# Patient Record
Sex: Female | Born: 1960 | Race: Black or African American | Hispanic: No | Marital: Single | Smoking: Current every day smoker
Health system: Southern US, Community
[De-identification: ages and names within clinical notes are randomized; demographics above are authoritative.]

## PROBLEM LIST (undated history)

## (undated) DIAGNOSIS — N63 Unspecified lump in unspecified breast: Secondary | ICD-10-CM

## (undated) DIAGNOSIS — F319 Bipolar disorder, unspecified: Secondary | ICD-10-CM

## (undated) DIAGNOSIS — R519 Headache, unspecified: Secondary | ICD-10-CM

## (undated) DIAGNOSIS — J45909 Unspecified asthma, uncomplicated: Secondary | ICD-10-CM

## (undated) DIAGNOSIS — F329 Major depressive disorder, single episode, unspecified: Secondary | ICD-10-CM

## (undated) DIAGNOSIS — N920 Excessive and frequent menstruation with regular cycle: Secondary | ICD-10-CM

## (undated) DIAGNOSIS — D126 Benign neoplasm of colon, unspecified: Secondary | ICD-10-CM

## (undated) DIAGNOSIS — J40 Bronchitis, not specified as acute or chronic: Secondary | ICD-10-CM

## (undated) DIAGNOSIS — J439 Emphysema, unspecified: Secondary | ICD-10-CM

## (undated) DIAGNOSIS — R51 Headache: Secondary | ICD-10-CM

## (undated) DIAGNOSIS — N938 Other specified abnormal uterine and vaginal bleeding: Secondary | ICD-10-CM

## (undated) DIAGNOSIS — J449 Chronic obstructive pulmonary disease, unspecified: Secondary | ICD-10-CM

## (undated) DIAGNOSIS — F1910 Other psychoactive substance abuse, uncomplicated: Secondary | ICD-10-CM

## (undated) DIAGNOSIS — K219 Gastro-esophageal reflux disease without esophagitis: Secondary | ICD-10-CM

## (undated) DIAGNOSIS — M1990 Unspecified osteoarthritis, unspecified site: Secondary | ICD-10-CM

## (undated) DIAGNOSIS — M545 Low back pain: Secondary | ICD-10-CM

## (undated) DIAGNOSIS — I639 Cerebral infarction, unspecified: Secondary | ICD-10-CM

## (undated) DIAGNOSIS — J441 Chronic obstructive pulmonary disease with (acute) exacerbation: Secondary | ICD-10-CM

## (undated) DIAGNOSIS — R600 Localized edema: Secondary | ICD-10-CM

## (undated) DIAGNOSIS — R443 Hallucinations, unspecified: Secondary | ICD-10-CM

## (undated) DIAGNOSIS — G8929 Other chronic pain: Secondary | ICD-10-CM

## (undated) DIAGNOSIS — N83209 Unspecified ovarian cyst, unspecified side: Secondary | ICD-10-CM

## (undated) DIAGNOSIS — E785 Hyperlipidemia, unspecified: Secondary | ICD-10-CM

## (undated) DIAGNOSIS — R55 Syncope and collapse: Secondary | ICD-10-CM

## (undated) DIAGNOSIS — F32A Depression, unspecified: Secondary | ICD-10-CM

## (undated) DIAGNOSIS — I82890 Acute embolism and thrombosis of other specified veins: Secondary | ICD-10-CM

## (undated) DIAGNOSIS — G4730 Sleep apnea, unspecified: Secondary | ICD-10-CM

## (undated) DIAGNOSIS — F419 Anxiety disorder, unspecified: Secondary | ICD-10-CM

## (undated) HISTORY — PX: COLONOSCOPY: SHX174

## (undated) HISTORY — DX: Emphysema, unspecified: J43.9

## (undated) HISTORY — DX: Unspecified lump in unspecified breast: N63.0

## (undated) HISTORY — DX: Low back pain: M54.5

## (undated) HISTORY — DX: Other specified abnormal uterine and vaginal bleeding: N93.8

## (undated) HISTORY — PX: POLYPECTOMY: SHX149

## (undated) HISTORY — DX: Headache: R51

## (undated) HISTORY — DX: Gastro-esophageal reflux disease without esophagitis: K21.9

## (undated) HISTORY — DX: Headache, unspecified: R51.9

## (undated) HISTORY — DX: Localized edema: R60.0

## (undated) HISTORY — PX: OTHER SURGICAL HISTORY: SHX169

## (undated) HISTORY — DX: Other chronic pain: G89.29

## (undated) HISTORY — DX: Anxiety disorder, unspecified: F41.9

## (undated) HISTORY — PX: VAGINA SURGERY: SHX829

## (undated) HISTORY — PX: HERNIA REPAIR: SHX51

## (undated) HISTORY — DX: Hallucinations, unspecified: R44.3

## (undated) HISTORY — DX: Depression, unspecified: F32.A

## (undated) HISTORY — DX: Hyperlipidemia, unspecified: E78.5

## (undated) HISTORY — DX: Syncope and collapse: R55

## (undated) HISTORY — DX: Other psychoactive substance abuse, uncomplicated: F19.10

## (undated) HISTORY — DX: Bipolar disorder, unspecified: F31.9

## (undated) HISTORY — DX: Cerebral infarction, unspecified: I63.9

## (undated) HISTORY — DX: Benign neoplasm of colon, unspecified: D12.6

## (undated) HISTORY — DX: Excessive and frequent menstruation with regular cycle: N92.0

## (undated) HISTORY — DX: Unspecified osteoarthritis, unspecified site: M19.90

## (undated) HISTORY — PX: OOPHORECTOMY: SHX86

## (undated) HISTORY — DX: Sleep apnea, unspecified: G47.30

---
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
## 1999-04-20 ENCOUNTER — Inpatient Hospital Stay (HOSPITAL_COMMUNITY): Admission: EM | Admit: 1999-04-20 | Discharge: 1999-04-24 | Payer: Self-pay | Admitting: Emergency Medicine

## 1999-04-20 ENCOUNTER — Encounter: Payer: Self-pay | Admitting: Emergency Medicine

## 1999-09-24 ENCOUNTER — Emergency Department (HOSPITAL_COMMUNITY): Admission: EM | Admit: 1999-09-24 | Discharge: 1999-09-24 | Payer: Self-pay | Admitting: Emergency Medicine

## 2000-04-07 ENCOUNTER — Emergency Department (HOSPITAL_COMMUNITY): Admission: EM | Admit: 2000-04-07 | Discharge: 2000-04-07 | Payer: Self-pay | Admitting: Emergency Medicine

## 2000-04-07 ENCOUNTER — Encounter: Payer: Self-pay | Admitting: Emergency Medicine

## 2000-07-21 ENCOUNTER — Emergency Department (HOSPITAL_COMMUNITY): Admission: EM | Admit: 2000-07-21 | Discharge: 2000-07-21 | Payer: Self-pay | Admitting: Emergency Medicine

## 2000-07-26 ENCOUNTER — Emergency Department (HOSPITAL_COMMUNITY): Admission: EM | Admit: 2000-07-26 | Discharge: 2000-07-26 | Payer: Self-pay | Admitting: Emergency Medicine

## 2001-01-18 ENCOUNTER — Encounter: Admission: RE | Admit: 2001-01-18 | Discharge: 2001-01-18 | Payer: Self-pay | Admitting: Internal Medicine

## 2001-01-18 ENCOUNTER — Encounter: Payer: Self-pay | Admitting: Internal Medicine

## 2001-08-08 ENCOUNTER — Emergency Department (HOSPITAL_COMMUNITY): Admission: EM | Admit: 2001-08-08 | Discharge: 2001-08-08 | Payer: Self-pay | Admitting: Emergency Medicine

## 2001-08-24 ENCOUNTER — Encounter: Admission: RE | Admit: 2001-08-24 | Discharge: 2001-08-24 | Payer: Self-pay | Admitting: Internal Medicine

## 2001-10-12 ENCOUNTER — Encounter: Admission: RE | Admit: 2001-10-12 | Discharge: 2001-10-12 | Payer: Self-pay | Admitting: Obstetrics

## 2001-12-03 ENCOUNTER — Emergency Department (HOSPITAL_COMMUNITY): Admission: EM | Admit: 2001-12-03 | Discharge: 2001-12-03 | Payer: Self-pay | Admitting: Emergency Medicine

## 2001-12-12 ENCOUNTER — Emergency Department (HOSPITAL_COMMUNITY): Admission: EM | Admit: 2001-12-12 | Discharge: 2001-12-12 | Payer: Self-pay | Admitting: Emergency Medicine

## 2001-12-18 ENCOUNTER — Emergency Department (HOSPITAL_COMMUNITY): Admission: EM | Admit: 2001-12-18 | Discharge: 2001-12-18 | Payer: Self-pay | Admitting: Emergency Medicine

## 2001-12-18 ENCOUNTER — Encounter: Payer: Self-pay | Admitting: Emergency Medicine

## 2002-01-02 ENCOUNTER — Encounter: Admission: RE | Admit: 2002-01-02 | Discharge: 2002-01-02 | Payer: Self-pay

## 2002-03-20 ENCOUNTER — Emergency Department (HOSPITAL_COMMUNITY): Admission: EM | Admit: 2002-03-20 | Discharge: 2002-03-20 | Payer: Self-pay | Admitting: *Deleted

## 2002-04-05 ENCOUNTER — Emergency Department (HOSPITAL_COMMUNITY): Admission: EM | Admit: 2002-04-05 | Discharge: 2002-04-05 | Payer: Self-pay | Admitting: Emergency Medicine

## 2002-04-26 ENCOUNTER — Emergency Department (HOSPITAL_COMMUNITY): Admission: EM | Admit: 2002-04-26 | Discharge: 2002-04-26 | Payer: Self-pay | Admitting: Emergency Medicine

## 2002-07-31 ENCOUNTER — Encounter: Payer: Self-pay | Admitting: Internal Medicine

## 2002-07-31 ENCOUNTER — Encounter: Admission: RE | Admit: 2002-07-31 | Discharge: 2002-07-31 | Payer: Self-pay | Admitting: Internal Medicine

## 2003-04-04 ENCOUNTER — Emergency Department (HOSPITAL_COMMUNITY): Admission: EM | Admit: 2003-04-04 | Discharge: 2003-04-04 | Payer: Self-pay | Admitting: *Deleted

## 2003-06-24 ENCOUNTER — Encounter: Admission: RE | Admit: 2003-06-24 | Discharge: 2003-06-24 | Payer: Self-pay | Admitting: Internal Medicine

## 2003-06-25 ENCOUNTER — Ambulatory Visit (HOSPITAL_COMMUNITY): Admission: RE | Admit: 2003-06-25 | Discharge: 2003-06-25 | Payer: Self-pay | Admitting: Internal Medicine

## 2003-08-04 ENCOUNTER — Emergency Department (HOSPITAL_COMMUNITY): Admission: EM | Admit: 2003-08-04 | Discharge: 2003-08-04 | Payer: Self-pay | Admitting: Emergency Medicine

## 2003-10-23 ENCOUNTER — Emergency Department (HOSPITAL_COMMUNITY): Admission: EM | Admit: 2003-10-23 | Discharge: 2003-10-23 | Payer: Self-pay | Admitting: Emergency Medicine

## 2003-10-24 ENCOUNTER — Encounter: Admission: RE | Admit: 2003-10-24 | Discharge: 2003-10-24 | Payer: Self-pay | Admitting: Internal Medicine

## 2004-03-31 ENCOUNTER — Emergency Department (HOSPITAL_COMMUNITY): Admission: EM | Admit: 2004-03-31 | Discharge: 2004-03-31 | Payer: Self-pay | Admitting: Emergency Medicine

## 2004-09-09 ENCOUNTER — Ambulatory Visit: Payer: Self-pay | Admitting: Internal Medicine

## 2005-01-08 ENCOUNTER — Emergency Department (HOSPITAL_COMMUNITY): Admission: EM | Admit: 2005-01-08 | Discharge: 2005-01-08 | Payer: Self-pay | Admitting: Emergency Medicine

## 2005-05-17 ENCOUNTER — Emergency Department (HOSPITAL_COMMUNITY): Admission: EM | Admit: 2005-05-17 | Discharge: 2005-05-17 | Payer: Self-pay | Admitting: Emergency Medicine

## 2005-06-12 ENCOUNTER — Emergency Department (HOSPITAL_COMMUNITY): Admission: EM | Admit: 2005-06-12 | Discharge: 2005-06-12 | Payer: Self-pay | Admitting: Emergency Medicine

## 2005-09-19 ENCOUNTER — Emergency Department (HOSPITAL_COMMUNITY): Admission: EM | Admit: 2005-09-19 | Discharge: 2005-09-19 | Payer: Self-pay | Admitting: Emergency Medicine

## 2005-11-10 ENCOUNTER — Emergency Department (HOSPITAL_COMMUNITY): Admission: EM | Admit: 2005-11-10 | Discharge: 2005-11-10 | Payer: Self-pay | Admitting: Emergency Medicine

## 2006-05-02 ENCOUNTER — Emergency Department (HOSPITAL_COMMUNITY): Admission: EM | Admit: 2006-05-02 | Discharge: 2006-05-02 | Payer: Self-pay | Admitting: Emergency Medicine

## 2008-03-07 ENCOUNTER — Ambulatory Visit: Payer: Self-pay | Admitting: Internal Medicine

## 2008-03-07 ENCOUNTER — Encounter: Payer: Self-pay | Admitting: Emergency Medicine

## 2008-03-07 ENCOUNTER — Inpatient Hospital Stay (HOSPITAL_COMMUNITY): Admission: AD | Admit: 2008-03-07 | Discharge: 2008-03-14 | Payer: Self-pay | Admitting: Internal Medicine

## 2008-03-08 ENCOUNTER — Ambulatory Visit: Payer: Self-pay | Admitting: Vascular Surgery

## 2008-03-08 ENCOUNTER — Encounter (INDEPENDENT_AMBULATORY_CARE_PROVIDER_SITE_OTHER): Payer: Self-pay | Admitting: Internal Medicine

## 2008-03-14 ENCOUNTER — Encounter: Payer: Self-pay | Admitting: Internal Medicine

## 2008-03-14 DIAGNOSIS — F1910 Other psychoactive substance abuse, uncomplicated: Secondary | ICD-10-CM | POA: Insufficient documentation

## 2008-03-14 DIAGNOSIS — G43809 Other migraine, not intractable, without status migrainosus: Secondary | ICD-10-CM | POA: Insufficient documentation

## 2008-03-14 DIAGNOSIS — F411 Generalized anxiety disorder: Secondary | ICD-10-CM | POA: Insufficient documentation

## 2008-03-18 ENCOUNTER — Ambulatory Visit: Payer: Self-pay | Admitting: Internal Medicine

## 2008-03-18 ENCOUNTER — Encounter (INDEPENDENT_AMBULATORY_CARE_PROVIDER_SITE_OTHER): Payer: Self-pay | Admitting: *Deleted

## 2008-03-18 ENCOUNTER — Encounter (INDEPENDENT_AMBULATORY_CARE_PROVIDER_SITE_OTHER): Payer: Self-pay | Admitting: Internal Medicine

## 2008-03-18 DIAGNOSIS — R443 Hallucinations, unspecified: Secondary | ICD-10-CM

## 2008-03-18 HISTORY — DX: Hallucinations, unspecified: R44.3

## 2008-03-18 LAB — CONVERTED CEMR LAB
ALT: 39 units/L — ABNORMAL HIGH (ref 0–35)
AST: 31 units/L (ref 0–37)
Albumin: 3.3 g/dL — ABNORMAL LOW (ref 3.5–5.2)
Alkaline Phosphatase: 52 units/L (ref 39–117)
Ammonia: 10 umol/L — ABNORMAL LOW (ref 11–35)
BUN: 10 mg/dL (ref 6–23)
Basophils Absolute: 0.1 10*3/uL (ref 0.0–0.1)
CO2: 28 meq/L (ref 19–32)
Chloride: 104 meq/L (ref 96–112)
Creatinine, Ser: 0.86 mg/dL (ref 0.40–1.20)
Creatinine,U: 224.4 mg/dL
Eosinophils Absolute: 0.1 10*3/uL (ref 0.0–0.7)
Eosinophils Relative: 0 % (ref 0–5)
Glucose, Bld: 78 mg/dL (ref 70–99)
Lymphocytes Relative: 31 % (ref 12–46)
MCV: 85 fL (ref 78.0–100.0)
Marijuana Metabolite: NEGATIVE
Monocytes Absolute: 1.4 10*3/uL — ABNORMAL HIGH (ref 0.1–1.0)
Monocytes Relative: 8 % (ref 3–12)
Neutro Abs: 10.1 10*3/uL — ABNORMAL HIGH (ref 1.7–7.7)
Platelets: 301 10*3/uL (ref 150–400)
Propoxyphene: NEGATIVE
RDW: 14.1 % (ref 11.5–15.5)
Total Bilirubin: 0.4 mg/dL (ref 0.3–1.2)
WBC: 16.6 10*3/uL — ABNORMAL HIGH (ref 4.0–10.5)

## 2008-03-19 ENCOUNTER — Encounter (INDEPENDENT_AMBULATORY_CARE_PROVIDER_SITE_OTHER): Payer: Self-pay | Admitting: Internal Medicine

## 2008-03-19 ENCOUNTER — Ambulatory Visit: Payer: Self-pay | Admitting: Internal Medicine

## 2008-03-19 DIAGNOSIS — R109 Unspecified abdominal pain: Secondary | ICD-10-CM | POA: Insufficient documentation

## 2008-03-19 DIAGNOSIS — M79609 Pain in unspecified limb: Secondary | ICD-10-CM | POA: Insufficient documentation

## 2008-03-20 ENCOUNTER — Ambulatory Visit (HOSPITAL_COMMUNITY): Admission: RE | Admit: 2008-03-20 | Discharge: 2008-03-20 | Payer: Self-pay | Admitting: Internal Medicine

## 2008-03-20 ENCOUNTER — Encounter: Payer: Self-pay | Admitting: Licensed Clinical Social Worker

## 2008-03-25 ENCOUNTER — Encounter (INDEPENDENT_AMBULATORY_CARE_PROVIDER_SITE_OTHER): Payer: Self-pay | Admitting: Internal Medicine

## 2008-03-25 ENCOUNTER — Ambulatory Visit: Payer: Self-pay | Admitting: Internal Medicine

## 2008-03-25 DIAGNOSIS — N63 Unspecified lump in unspecified breast: Secondary | ICD-10-CM

## 2008-03-25 DIAGNOSIS — H539 Unspecified visual disturbance: Secondary | ICD-10-CM | POA: Insufficient documentation

## 2008-03-25 DIAGNOSIS — N644 Mastodynia: Secondary | ICD-10-CM | POA: Insufficient documentation

## 2008-03-25 HISTORY — DX: Unspecified lump in unspecified breast: N63.0

## 2008-03-26 ENCOUNTER — Ambulatory Visit (HOSPITAL_COMMUNITY): Admission: RE | Admit: 2008-03-26 | Discharge: 2008-03-26 | Payer: Self-pay | Admitting: Internal Medicine

## 2008-03-26 ENCOUNTER — Encounter (INDEPENDENT_AMBULATORY_CARE_PROVIDER_SITE_OTHER): Payer: Self-pay | Admitting: Internal Medicine

## 2008-03-27 ENCOUNTER — Ambulatory Visit (HOSPITAL_COMMUNITY): Admission: RE | Admit: 2008-03-27 | Discharge: 2008-03-27 | Payer: Self-pay | Admitting: Internal Medicine

## 2008-04-01 ENCOUNTER — Telehealth: Payer: Self-pay | Admitting: Licensed Clinical Social Worker

## 2008-04-02 ENCOUNTER — Ambulatory Visit: Payer: Self-pay | Admitting: Vascular Surgery

## 2008-04-02 ENCOUNTER — Encounter (INDEPENDENT_AMBULATORY_CARE_PROVIDER_SITE_OTHER): Payer: Self-pay | Admitting: Internal Medicine

## 2008-04-02 ENCOUNTER — Ambulatory Visit (HOSPITAL_COMMUNITY): Admission: RE | Admit: 2008-04-02 | Discharge: 2008-04-02 | Payer: Self-pay | Admitting: Internal Medicine

## 2008-04-02 ENCOUNTER — Encounter: Payer: Self-pay | Admitting: Internal Medicine

## 2008-04-03 ENCOUNTER — Encounter: Admission: RE | Admit: 2008-04-03 | Discharge: 2008-04-03 | Payer: Self-pay | Admitting: *Deleted

## 2008-04-08 ENCOUNTER — Encounter (INDEPENDENT_AMBULATORY_CARE_PROVIDER_SITE_OTHER): Payer: Self-pay | Admitting: Internal Medicine

## 2008-04-09 ENCOUNTER — Encounter (INDEPENDENT_AMBULATORY_CARE_PROVIDER_SITE_OTHER): Payer: Self-pay | Admitting: Internal Medicine

## 2008-04-09 ENCOUNTER — Ambulatory Visit: Payer: Self-pay | Admitting: Internal Medicine

## 2008-04-09 LAB — CONVERTED CEMR LAB
Anti Nuclear Antibody(ANA): NEGATIVE
BUN: 6 mg/dL (ref 6–23)
Barbiturate Quant, Ur: NEGATIVE
Bilirubin Urine: NEGATIVE
CO2: 23 meq/L (ref 19–32)
Calcium: 8.7 mg/dL (ref 8.4–10.5)
Creatinine, Ser: 0.78 mg/dL (ref 0.40–1.20)
Creatinine,U: 158.4 mg/dL
Hemoglobin, Urine: NEGATIVE
Ketones, ur: NEGATIVE mg/dL
Marijuana Metabolite: NEGATIVE
Potassium: 4.3 meq/L (ref 3.5–5.3)
Propoxyphene: NEGATIVE
Sed Rate: 9 mm/hr (ref 0–22)
Sodium: 141 meq/L (ref 135–145)
Specific Gravity, Urine: 1.03 (ref 1.005–1.03)
Urine Glucose: NEGATIVE mg/dL

## 2008-04-12 ENCOUNTER — Encounter: Admission: RE | Admit: 2008-04-12 | Discharge: 2008-04-12 | Payer: Self-pay | Admitting: *Deleted

## 2008-04-12 ENCOUNTER — Encounter (INDEPENDENT_AMBULATORY_CARE_PROVIDER_SITE_OTHER): Payer: Self-pay | Admitting: Diagnostic Radiology

## 2008-04-17 ENCOUNTER — Encounter: Admission: RE | Admit: 2008-04-17 | Discharge: 2008-04-17 | Payer: Self-pay | Admitting: Internal Medicine

## 2008-04-29 ENCOUNTER — Ambulatory Visit: Payer: Self-pay | Admitting: *Deleted

## 2008-04-29 ENCOUNTER — Encounter (INDEPENDENT_AMBULATORY_CARE_PROVIDER_SITE_OTHER): Payer: Self-pay | Admitting: Internal Medicine

## 2008-04-29 LAB — CONVERTED CEMR LAB
ALT: 20 units/L (ref 0–35)
Albumin: 3.2 g/dL — ABNORMAL LOW (ref 3.5–5.2)
Alkaline Phosphatase: 57 units/L (ref 39–117)
BUN: 6 mg/dL (ref 6–23)
Calcium: 8.5 mg/dL (ref 8.4–10.5)
Chloride: 106 meq/L (ref 96–112)
Creatinine, Ser: 0.74 mg/dL (ref 0.40–1.20)
Creatinine, Urine: 59 mg/dL
Microalb Creat Ratio: 4.1 mg/g (ref 0.0–30.0)
Microalb, Ur: 0.24 mg/dL (ref 0.00–1.89)
Total Protein: 5.4 g/dL — ABNORMAL LOW (ref 6.0–8.3)

## 2008-05-02 ENCOUNTER — Telehealth: Payer: Self-pay | Admitting: *Deleted

## 2008-05-02 ENCOUNTER — Encounter (INDEPENDENT_AMBULATORY_CARE_PROVIDER_SITE_OTHER): Payer: Self-pay | Admitting: Internal Medicine

## 2008-05-02 ENCOUNTER — Telehealth (INDEPENDENT_AMBULATORY_CARE_PROVIDER_SITE_OTHER): Payer: Self-pay | Admitting: Internal Medicine

## 2008-05-08 ENCOUNTER — Encounter: Payer: Self-pay | Admitting: Family

## 2008-05-08 ENCOUNTER — Telehealth: Payer: Self-pay | Admitting: *Deleted

## 2008-05-08 ENCOUNTER — Ambulatory Visit: Payer: Self-pay | Admitting: Obstetrics & Gynecology

## 2008-05-09 ENCOUNTER — Ambulatory Visit: Payer: Self-pay | Admitting: Internal Medicine

## 2008-05-13 ENCOUNTER — Encounter (INDEPENDENT_AMBULATORY_CARE_PROVIDER_SITE_OTHER): Payer: Self-pay | Admitting: Internal Medicine

## 2008-05-14 ENCOUNTER — Encounter (INDEPENDENT_AMBULATORY_CARE_PROVIDER_SITE_OTHER): Payer: Self-pay | Admitting: Internal Medicine

## 2008-05-14 ENCOUNTER — Ambulatory Visit (HOSPITAL_COMMUNITY): Admission: RE | Admit: 2008-05-14 | Discharge: 2008-05-14 | Payer: Self-pay | Admitting: Internal Medicine

## 2008-05-31 ENCOUNTER — Ambulatory Visit: Payer: Self-pay | Admitting: Internal Medicine

## 2008-05-31 DIAGNOSIS — G4700 Insomnia, unspecified: Secondary | ICD-10-CM | POA: Insufficient documentation

## 2008-06-18 ENCOUNTER — Ambulatory Visit (HOSPITAL_COMMUNITY): Admission: RE | Admit: 2008-06-18 | Discharge: 2008-06-18 | Payer: Self-pay | Admitting: Obstetrics & Gynecology

## 2008-06-18 ENCOUNTER — Encounter: Payer: Self-pay | Admitting: Obstetrics & Gynecology

## 2008-06-18 ENCOUNTER — Ambulatory Visit: Payer: Self-pay | Admitting: Obstetrics & Gynecology

## 2008-06-24 ENCOUNTER — Encounter (INDEPENDENT_AMBULATORY_CARE_PROVIDER_SITE_OTHER): Payer: Self-pay | Admitting: Internal Medicine

## 2008-07-19 ENCOUNTER — Ambulatory Visit: Payer: Self-pay | Admitting: Gynecology

## 2008-07-27 ENCOUNTER — Encounter (INDEPENDENT_AMBULATORY_CARE_PROVIDER_SITE_OTHER): Payer: Self-pay | Admitting: Internal Medicine

## 2008-08-08 ENCOUNTER — Ambulatory Visit: Payer: Self-pay | Admitting: Internal Medicine

## 2008-08-08 DIAGNOSIS — M545 Low back pain, unspecified: Secondary | ICD-10-CM | POA: Insufficient documentation

## 2008-08-08 DIAGNOSIS — J45901 Unspecified asthma with (acute) exacerbation: Secondary | ICD-10-CM | POA: Insufficient documentation

## 2008-08-08 DIAGNOSIS — G8929 Other chronic pain: Secondary | ICD-10-CM | POA: Insufficient documentation

## 2008-08-08 DIAGNOSIS — J4489 Other specified chronic obstructive pulmonary disease: Secondary | ICD-10-CM | POA: Diagnosis present

## 2008-08-08 DIAGNOSIS — J45909 Unspecified asthma, uncomplicated: Secondary | ICD-10-CM

## 2008-08-08 HISTORY — DX: Other chronic pain: G89.29

## 2008-08-08 HISTORY — DX: Low back pain, unspecified: M54.50

## 2008-08-29 ENCOUNTER — Telehealth (INDEPENDENT_AMBULATORY_CARE_PROVIDER_SITE_OTHER): Payer: Self-pay | Admitting: *Deleted

## 2008-08-29 ENCOUNTER — Ambulatory Visit: Payer: Self-pay | Admitting: Internal Medicine

## 2008-08-29 ENCOUNTER — Encounter (INDEPENDENT_AMBULATORY_CARE_PROVIDER_SITE_OTHER): Payer: Self-pay | Admitting: Internal Medicine

## 2008-08-29 ENCOUNTER — Ambulatory Visit (HOSPITAL_COMMUNITY): Admission: RE | Admit: 2008-08-29 | Discharge: 2008-08-29 | Payer: Self-pay | Admitting: Internal Medicine

## 2008-08-29 ENCOUNTER — Encounter (INDEPENDENT_AMBULATORY_CARE_PROVIDER_SITE_OTHER): Payer: Self-pay | Admitting: *Deleted

## 2008-08-29 DIAGNOSIS — R209 Unspecified disturbances of skin sensation: Secondary | ICD-10-CM | POA: Insufficient documentation

## 2008-08-30 LAB — CONVERTED CEMR LAB: Rhuematoid fact SerPl-aCnc: <20 intl units/mL (ref 0–20)

## 2008-09-06 ENCOUNTER — Telehealth: Payer: Self-pay | Admitting: *Deleted

## 2008-10-01 ENCOUNTER — Encounter (INDEPENDENT_AMBULATORY_CARE_PROVIDER_SITE_OTHER): Payer: Self-pay | Admitting: Internal Medicine

## 2008-10-02 ENCOUNTER — Encounter (INDEPENDENT_AMBULATORY_CARE_PROVIDER_SITE_OTHER): Payer: Self-pay | Admitting: Internal Medicine

## 2008-10-08 ENCOUNTER — Ambulatory Visit: Payer: Self-pay | Admitting: Internal Medicine

## 2008-10-08 ENCOUNTER — Encounter (INDEPENDENT_AMBULATORY_CARE_PROVIDER_SITE_OTHER): Payer: Self-pay | Admitting: Internal Medicine

## 2008-10-08 DIAGNOSIS — R0609 Other forms of dyspnea: Secondary | ICD-10-CM

## 2008-10-08 DIAGNOSIS — R0989 Other specified symptoms and signs involving the circulatory and respiratory systems: Secondary | ICD-10-CM | POA: Insufficient documentation

## 2008-10-08 DIAGNOSIS — M129 Arthropathy, unspecified: Secondary | ICD-10-CM | POA: Insufficient documentation

## 2008-10-08 LAB — CONVERTED CEMR LAB
Albumin, U: DETECTED %
Alpha-1-Globulin: 4.5 % (ref 2.9–4.9)
Anti Nuclear Antibody(ANA): NEGATIVE
CRP: 0.3 mg/dL (ref <0.6)
Free Lambda Lt Chains,Ur: 0.18 mg/dL (ref 0.08–1.01)
HCV Ab: NEGATIVE
Hepatitis B Surface Ag: NEGATIVE

## 2008-10-24 ENCOUNTER — Ambulatory Visit: Payer: Self-pay | Admitting: Obstetrics & Gynecology

## 2008-10-30 ENCOUNTER — Ambulatory Visit (HOSPITAL_BASED_OUTPATIENT_CLINIC_OR_DEPARTMENT_OTHER): Admission: RE | Admit: 2008-10-30 | Discharge: 2008-10-30 | Payer: Self-pay | Admitting: Family Medicine

## 2008-10-30 ENCOUNTER — Encounter (INDEPENDENT_AMBULATORY_CARE_PROVIDER_SITE_OTHER): Payer: Self-pay | Admitting: Internal Medicine

## 2008-11-01 ENCOUNTER — Ambulatory Visit: Payer: Self-pay | Admitting: Internal Medicine

## 2008-11-25 ENCOUNTER — Telehealth: Payer: Self-pay | Admitting: *Deleted

## 2008-11-27 ENCOUNTER — Ambulatory Visit: Payer: Self-pay | Admitting: Internal Medicine

## 2008-11-27 ENCOUNTER — Encounter (INDEPENDENT_AMBULATORY_CARE_PROVIDER_SITE_OTHER): Payer: Self-pay | Admitting: Internal Medicine

## 2008-11-27 DIAGNOSIS — R635 Abnormal weight gain: Secondary | ICD-10-CM | POA: Insufficient documentation

## 2008-11-27 DIAGNOSIS — M533 Sacrococcygeal disorders, not elsewhere classified: Secondary | ICD-10-CM | POA: Insufficient documentation

## 2008-11-27 DIAGNOSIS — M549 Dorsalgia, unspecified: Secondary | ICD-10-CM

## 2008-12-08 LAB — CONVERTED CEMR LAB
CO2: 21 meq/L (ref 19–32)
Calcium: 9.1 mg/dL (ref 8.4–10.5)
Chloride: 107 meq/L (ref 96–112)
Creatinine, Ser: 1.11 mg/dL (ref 0.40–1.20)
Glucose, Bld: 91 mg/dL (ref 70–99)
Sodium: 139 meq/L (ref 135–145)

## 2009-02-26 ENCOUNTER — Telehealth: Payer: Self-pay | Admitting: *Deleted

## 2009-03-24 ENCOUNTER — Telehealth (INDEPENDENT_AMBULATORY_CARE_PROVIDER_SITE_OTHER): Payer: Self-pay | Admitting: Internal Medicine

## 2009-04-11 ENCOUNTER — Encounter: Admission: RE | Admit: 2009-04-11 | Discharge: 2009-04-11 | Payer: Self-pay | Admitting: Internal Medicine

## 2009-04-16 ENCOUNTER — Encounter: Admission: RE | Admit: 2009-04-16 | Discharge: 2009-04-16 | Payer: Self-pay | Admitting: Internal Medicine

## 2009-04-16 ENCOUNTER — Encounter (INDEPENDENT_AMBULATORY_CARE_PROVIDER_SITE_OTHER): Payer: Self-pay | Admitting: Internal Medicine

## 2009-06-11 ENCOUNTER — Telehealth (INDEPENDENT_AMBULATORY_CARE_PROVIDER_SITE_OTHER): Payer: Self-pay | Admitting: Internal Medicine

## 2009-11-25 ENCOUNTER — Ambulatory Visit: Payer: Self-pay | Admitting: Internal Medicine

## 2009-11-27 ENCOUNTER — Encounter: Admission: RE | Admit: 2009-11-27 | Discharge: 2009-11-27 | Payer: Self-pay | Admitting: Internal Medicine

## 2009-12-16 ENCOUNTER — Telehealth (INDEPENDENT_AMBULATORY_CARE_PROVIDER_SITE_OTHER): Payer: Self-pay | Admitting: Internal Medicine

## 2009-12-26 ENCOUNTER — Telehealth (INDEPENDENT_AMBULATORY_CARE_PROVIDER_SITE_OTHER): Payer: Self-pay | Admitting: Internal Medicine

## 2010-01-01 ENCOUNTER — Emergency Department (HOSPITAL_COMMUNITY): Admission: EM | Admit: 2010-01-01 | Discharge: 2010-01-01 | Payer: Self-pay | Admitting: Emergency Medicine

## 2010-01-01 ENCOUNTER — Encounter (INDEPENDENT_AMBULATORY_CARE_PROVIDER_SITE_OTHER): Payer: Self-pay | Admitting: Internal Medicine

## 2010-01-09 ENCOUNTER — Telehealth (INDEPENDENT_AMBULATORY_CARE_PROVIDER_SITE_OTHER): Payer: Self-pay | Admitting: Internal Medicine

## 2010-01-21 ENCOUNTER — Encounter (INDEPENDENT_AMBULATORY_CARE_PROVIDER_SITE_OTHER): Payer: Self-pay | Admitting: Internal Medicine

## 2010-01-21 ENCOUNTER — Ambulatory Visit: Payer: Self-pay | Admitting: Internal Medicine

## 2010-01-21 ENCOUNTER — Ambulatory Visit (HOSPITAL_COMMUNITY): Admission: RE | Admit: 2010-01-21 | Discharge: 2010-01-21 | Payer: Self-pay | Admitting: Internal Medicine

## 2010-01-21 DIAGNOSIS — F319 Bipolar disorder, unspecified: Secondary | ICD-10-CM | POA: Insufficient documentation

## 2010-01-21 DIAGNOSIS — K219 Gastro-esophageal reflux disease without esophagitis: Secondary | ICD-10-CM | POA: Insufficient documentation

## 2010-01-21 DIAGNOSIS — R0789 Other chest pain: Secondary | ICD-10-CM | POA: Insufficient documentation

## 2010-06-10 ENCOUNTER — Encounter: Payer: Self-pay | Admitting: Internal Medicine

## 2010-11-30 ENCOUNTER — Encounter: Admission: RE | Admit: 2010-11-30 | Discharge: 2010-11-30 | Payer: Self-pay | Source: Home / Self Care

## 2010-12-13 DIAGNOSIS — I82890 Acute embolism and thrombosis of other specified veins: Secondary | ICD-10-CM | POA: Insufficient documentation

## 2010-12-13 HISTORY — DX: Acute embolism and thrombosis of other specified veins: I82.890

## 2010-12-20 ENCOUNTER — Emergency Department (HOSPITAL_COMMUNITY)
Admission: EM | Admit: 2010-12-20 | Discharge: 2010-12-20 | Payer: Self-pay | Source: Home / Self Care | Admitting: Emergency Medicine

## 2010-12-20 ENCOUNTER — Inpatient Hospital Stay (HOSPITAL_COMMUNITY)
Admission: AD | Admit: 2010-12-20 | Discharge: 2010-12-20 | Disposition: A | Discharge status: Other Acute Inpatient Hospital | Payer: Self-pay | Source: Home / Self Care | Attending: Obstetrics & Gynecology | Admitting: Obstetrics & Gynecology

## 2010-12-20 ENCOUNTER — Telehealth: Payer: Self-pay | Admitting: Internal Medicine

## 2010-12-21 ENCOUNTER — Ambulatory Visit
Admission: RE | Admit: 2010-12-21 | Discharge: 2010-12-21 | Payer: Self-pay | Source: Home / Self Care | Attending: Internal Medicine | Admitting: Internal Medicine

## 2010-12-21 DIAGNOSIS — N926 Irregular menstruation, unspecified: Secondary | ICD-10-CM | POA: Insufficient documentation

## 2010-12-21 DIAGNOSIS — I82409 Acute embolism and thrombosis of unspecified deep veins of unspecified lower extremity: Secondary | ICD-10-CM | POA: Insufficient documentation

## 2010-12-21 DIAGNOSIS — N939 Abnormal uterine and vaginal bleeding, unspecified: Secondary | ICD-10-CM | POA: Insufficient documentation

## 2010-12-23 ENCOUNTER — Ambulatory Visit (HOSPITAL_COMMUNITY)
Admission: RE | Admit: 2010-12-23 | Discharge: 2010-12-23 | Payer: Self-pay | Source: Home / Self Care | Attending: Internal Medicine | Admitting: Internal Medicine

## 2010-12-24 ENCOUNTER — Ambulatory Visit: Admission: RE | Admit: 2010-12-24 | Discharge: 2010-12-24 | Payer: Self-pay | Source: Home / Self Care

## 2010-12-24 LAB — CONVERTED CEMR LAB: INR: 1

## 2010-12-28 LAB — CBC
HCT: 43.4 % (ref 36.0–46.0)
Hemoglobin: 14.3 g/dL (ref 12.0–15.0)
MCH: 27.7 pg (ref 26.0–34.0)
MCHC: 32.9 g/dL (ref 30.0–36.0)
MCV: 84.1 fL (ref 78.0–100.0)
Platelets: 316 10*3/uL (ref 150–400)
RBC: 5.16 MIL/uL — ABNORMAL HIGH (ref 3.87–5.11)
RDW: 14.1 % (ref 11.5–15.5)
WBC: 7.8 10*3/uL (ref 4.0–10.5)

## 2010-12-28 LAB — RAPID URINE DRUG SCREEN, HOSP PERFORMED
Amphetamines: NOT DETECTED
Barbiturates: NOT DETECTED
Benzodiazepines: NOT DETECTED
Cocaine: POSITIVE — AB
Opiates: NOT DETECTED
Tetrahydrocannabinol: POSITIVE — AB

## 2010-12-28 LAB — BASIC METABOLIC PANEL
BUN: 6 mg/dL (ref 6–23)
CO2: 22 mEq/L (ref 19–32)
Calcium: 8 mg/dL — ABNORMAL LOW (ref 8.4–10.5)
Chloride: 108 mEq/L (ref 96–112)
Creatinine, Ser: 0.82 mg/dL (ref 0.4–1.2)
GFR calc Af Amer: >60 mL/min (ref >60)
GFR calc non Af Amer: >60 mL/min (ref >60)
Glucose, Bld: 88 mg/dL (ref 70–99)
Potassium: 3.8 mEq/L (ref 3.5–5.1)
Sodium: 136 mEq/L (ref 135–145)

## 2010-12-28 LAB — COMPREHENSIVE METABOLIC PANEL
ALT: 17 U/L (ref 0–35)
AST: 20 U/L (ref 0–37)
Albumin: 3.7 g/dL (ref 3.5–5.2)
Alkaline Phosphatase: 67 U/L (ref 39–117)
BUN: 7 mg/dL (ref 6–23)
CO2: 23 mEq/L (ref 19–32)
Calcium: 9 mg/dL (ref 8.4–10.5)
Chloride: 106 mEq/L (ref 96–112)
Creatinine, Ser: 0.82 mg/dL (ref 0.4–1.2)
GFR calc Af Amer: >60 mL/min (ref >60)
GFR calc non Af Amer: >60 mL/min (ref >60)
Glucose, Bld: 87 mg/dL (ref 70–99)
Potassium: 3.9 mEq/L (ref 3.5–5.1)
Sodium: 136 mEq/L (ref 135–145)
Total Bilirubin: 0.3 mg/dL (ref 0.3–1.2)
Total Protein: 6.8 g/dL (ref 6.0–8.3)

## 2010-12-28 LAB — POCT PREGNANCY, URINE: Preg Test, Ur: NEGATIVE

## 2010-12-28 LAB — URINE MICROSCOPIC-ADD ON

## 2010-12-28 LAB — URINALYSIS, ROUTINE W REFLEX MICROSCOPIC
Bilirubin Urine: NEGATIVE
Ketones, ur: NEGATIVE mg/dL
Leukocytes, UA: NEGATIVE
Nitrite: NEGATIVE
Protein, ur: NEGATIVE mg/dL
Specific Gravity, Urine: 1.02 (ref 1.005–1.030)
Urine Glucose, Fasting: NEGATIVE mg/dL
Urobilinogen, UA: 0.2 mg/dL (ref 0.0–1.0)
pH: 6 (ref 5.0–8.0)

## 2010-12-28 LAB — APTT: aPTT: 32 seconds (ref 24–37)

## 2010-12-28 LAB — SAMPLE TO BLOOD BANK

## 2010-12-28 LAB — PROTIME-INR
INR: 1.05 (ref 0.00–1.49)
Prothrombin Time: 13.9 seconds (ref 11.6–15.2)

## 2010-12-29 ENCOUNTER — Ambulatory Visit
Admission: RE | Admit: 2010-12-29 | Discharge: 2010-12-29 | Payer: Self-pay | Source: Home / Self Care | Attending: Internal Medicine | Admitting: Internal Medicine

## 2010-12-29 LAB — CONVERTED CEMR LAB
Basophils Relative: 0 % (ref 0–1)
Eosinophils Absolute: 0.4 10*3/uL (ref 0.0–0.7)
INR: 0.9
INR: 0.89 (ref <1.50)
Lymphocytes Relative: 51 % — ABNORMAL HIGH (ref 12–46)
Lymphs Abs: 3.4 10*3/uL (ref 0.7–4.0)
MCHC: 32.9 g/dL (ref 30.0–36.0)
MCV: 85.4 fL (ref 78.0–100.0)
Monocytes Relative: 11 % (ref 3–12)
Neutro Abs: 2.2 10*3/uL (ref 1.7–7.7)
Neutrophils Relative %: 33 % — ABNORMAL LOW (ref 43–77)
Platelets: 331 10*3/uL (ref 150–400)
Prothrombin Time: 12.3 s (ref 11.6–15.2)
RBC: 4.87 M/uL (ref 3.87–5.11)
WBC: 6.6 10*3/uL (ref 4.0–10.5)

## 2011-01-02 DIAGNOSIS — Z7901 Long term (current) use of anticoagulants: Secondary | ICD-10-CM

## 2011-01-02 DIAGNOSIS — I82409 Acute embolism and thrombosis of unspecified deep veins of unspecified lower extremity: Secondary | ICD-10-CM

## 2011-01-05 ENCOUNTER — Ambulatory Visit
Admission: RE | Admit: 2011-01-05 | Discharge: 2011-01-05 | Payer: Self-pay | Source: Home / Self Care | Attending: Internal Medicine | Admitting: Internal Medicine

## 2011-01-05 ENCOUNTER — Telehealth (INDEPENDENT_AMBULATORY_CARE_PROVIDER_SITE_OTHER): Payer: Self-pay | Admitting: *Deleted

## 2011-01-05 LAB — CONVERTED CEMR LAB: INR: 0.9

## 2011-01-07 ENCOUNTER — Other Ambulatory Visit: Payer: Self-pay | Admitting: Obstetrics and Gynecology

## 2011-01-07 ENCOUNTER — Ambulatory Visit
Admission: RE | Admit: 2011-01-07 | Discharge: 2011-01-07 | Payer: Self-pay | Source: Home / Self Care | Attending: Obstetrics and Gynecology | Admitting: Obstetrics and Gynecology

## 2011-01-08 NOTE — Progress Notes (Signed)
Kathleen [name], KOESTNER                 ACCOUNT NO.:  [id number]  MEDICAL RECORD NO.:  [id number]          PATIENT TYPE:  WOC  LOCATION:  [facility]                   FACILITY:  [facility]  PHYSICIAN:  Allie Bossier, MD        DATE OF BIRTH:  1961/07/09  DATE OF SERVICE:                                 CLINIC NOTE  Kathleen [name] is a 50 year old lady who is here for followup after her MAU visit.  She has a known history of crack cocaine abuse.  She is bipolar and went into the MAU on December 20, 2010, with a complaint of pelvic and right lower quadrant pain.  During her MAU visit, she "passed out" and was then transferred to [facility].  During her evaluation, she had a normal CMET and a normal CBC.  Her ultrasound of her pelvis showed a 1.3-cm fibroid and another 0.9-cm fibroid.  The rest of her pelvic ultrasound was normal.  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 The patient was started on Lovenox and Coumadin and given Percocet and sent home.  She followed up today requesting more Percocet.  Of note, she tells me she has not had a Pap smear in year, so I did a Pap smear. Her uterus is normal, size and shape.  She grimaces when I do a palpation of her ovaries and uterus, but her discharge is completely normal and there is no cervical motion tenderness and her abdominal exam is benign.  ASSESSMENT AND PLAN:  Pelvic pain.  I am unsure of what the etiology of her pelvic pain is.  Perhaps, it is related to her partial ovarian vein thrombus; however, I think a significant amount of her pain is psychogenic.  I spoke with the family practice physician who has previously seen her and we are both in agreement that due to her history of addiction, that continued narcotic use is not necessarily in her best interest.  I have given her prescription for Naprosyn 500 mg to be used twice a day.  She also tells me that she is taking both Lovenox and Coumadin.  After discussions with the family doctor  again, we both agreed that the Lovenox can be discontinued.  She will be seen at [facility] on this Monday to have an INR checked.     Allie Bossier, MD    MCD/MEDQ  D:  01/07/2011  T:  01/08/2011  Job:  [id number]

## 2011-01-11 ENCOUNTER — Ambulatory Visit: Admission: RE | Admit: 2011-01-11 | Discharge: 2011-01-11 | Payer: Self-pay | Source: Home / Self Care

## 2011-01-11 LAB — CONVERTED CEMR LAB

## 2011-01-13 ENCOUNTER — Encounter: Payer: Self-pay | Admitting: Internal Medicine

## 2011-01-14 NOTE — Miscellaneous (Signed)
Summary: [facility]   [facility]   Imported By: Margie Billet 06/22/2010 14:28:43  _____________________________________________________________________  External Attachment:    Type:   Image     Comment:   External Document

## 2011-01-14 NOTE — Progress Notes (Signed)
Summary: prior autorization/gp  Phone Note Outgoing Call   Summary of Call: Prior authorization required for Lidoderm Patch and Zolpidem 6.25mg .  Pt.'s plan will only approve Lidoderm patches with a dx.of post herpetic myalgia; the preferred drug is Voltaren gel.  And form needs to be completed for Zolpidem; which is in MD's mailbox.  Thanks Initial call taken by: Chinita Pester RN,  December 16, 2009 1:45 PM  Follow-up for Phone Call        Done. Follow-up by: [name]  MD,  December 17, 2009 2:08 PM     Appended Document: prior autorization/gp Dr. Andrey Campanile did want to change Lidoderm patch to Voltaren gel which is covered by pt.'s insurance?  Appended Document: prior autorization/gp Form faxed on Zolpidem for Prior Authorization.

## 2011-01-14 NOTE — Assessment & Plan Note (Addendum)
Summary: COU/CH  Anticoagulant Therapy Managed by: Barbera Setters. Janie Morning  PharmD CACP PCP: [name]  MD [facility] Attending: Lowella Bandy MD Indication 1: Deep vein thrombus Indication 2: Encounter for therapeutic drug monitoring  V58.83 Start date: 12/20/2010 Duration: 6 months  Patient Assessment Reviewed by: [name] PharmD  December 29, 2010 Medication review: verified warfarin dosage & schedule,verified previous prescription medications, verified doses & any changes, verified new medications, reviewed OTC medications, reviewed OTC health products-vitamins supplements etc Complications: none Dietary changes: none   Health status changes: none   Lifestyle changes: none   Recent/future hospitalizations: none   Recent/future procedures: none   Recent/future dental: none Patient Assessment Part 2:  Have you MISSED ANY DOSES or CHANGED TABLETS?  No missed Warfarin doses or changed tablets.  Have you had any BRUISING or BLEEDING ( nose or gum bleeds,blood in urine or stool)?  No reported bruising or bleeding in nose, gums, urine, stool.  Have you STARTED or STOPPED any MEDICATIONS, including OTC meds,herbals or supplements?  No other medications or herbal supplements were started or stopped.  Have you CHANGED your DIET, especially green vegetables,or ALCOHOL intake?  No changes in diet or alcohol intake.  Have you had any ILLNESSES or HOSPITALIZATIONS?  No reported illnesses or hospitalizations  Have you had any signs of CLOTTING?(chest discomfort,dizziness,shortness of breath,arms tingling,slurred speech,swelling or redness in leg)    No chest discomfort, dizziness, shortness of breath, tingling in arm, slurred speech, swelling, or redness in leg.     Treatment  Target INR: 2.0-3.0 INR: 0.9  Date: 12/29/2010 Regimen In:  35mg /4 days INR reflects regimen in: 0.9  New  Tablet strength: : 5mg  Regimen Out:     Sunday: 2 Tablet     Monday: 2 Tablet     Tuesday: 2  Tablet     Wednesday: 2 Tablet     Thursday: 2 Tablet      Friday: 2 Tablet     Saturday: 2 Tablet Total Weekly: 70.0mg/week mg  Next INR Due: 01/04/2011 Adjusted by: Benen Weida B. 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 We CONTINUE enoxaparin 1mg/kg subcutaneously q12h while awaiting therapeutic response to warfarin. Patient was citing "weakness" this AM while being seen in anticoagulation clinic. Ordered CBC---results which have been returned. Given her seeminly "non-responder" status to warfain--may consider performing a CLOTTING FACTOR X assay. We would anticipate an approximate 10 - 30% level of activity--IF the patient is on "quantum suffienct" warfarin (irrespective of the INR, i.e. she may have a DISCORDANCE between her INR response and the actual level of anticoagulation present).   Allergies: 1)  ! * Topamax  Process Orders Check Orders Results:     [facility]: ABN not required for this insurance Tests Sent for requisitioning (December 29, 2010 2:02 PM):     12/29/2010: [facility] -- T-CBC w/Diff [[id number]] (signed)     01 /17/2012: [facility] -- T-Protime, Auto [[id number]] (signed)

## 2011-01-14 NOTE — Assessment & Plan Note (Signed)
Summary: pain L breast, upper chest, tingling/pcp-none/hla   Primary Care Provider:  [name]  MD   History of Present Illness: Pt c/o wheezing and SOB and breathing at 35+ times a minute so escorted to ER and not fully evaluated here so no charge for her visit today.  Allergies: 1)  ! * Topamax   Complete Medication List: 1)  Symbicort 160-4.5 Mcg/act Aero (Budesonide-formoterol fumarate) .... One puff two times a day 2)  Proventil Hfa 108 (90 Base) Mcg/act Aers (Albuterol sulfate) .... One puff every 6 hours as needed for wheezing 3)  Spiriva Handihaler 18 Mcg Caps (Tiotropium bromide monohydrate) .... One puff daily as directed 4)  Omeprazole 20 Mg Tbec (Omeprazole) .... Take 1 tablet by mouth once a day 5)  Naproxen 500 Mg Tabs (Naproxen) .... Take one tablet by mouth up to twice a day as needed for pain. 6)  Flexeril 5 Mg Tabs (Cyclobenzaprine hcl) .... Take 1 tab by mouth at bedtime 7)  Ambien Cr 6.25 Mg Tbcr (Zolpidem tartrate) .... One tab nightly as needed for sleep 8)  Seroquel Xr 400 Mg Xr24h-tab (Quetiapine fumarate) .... Take two tablets by mouth at 6:00 p.m. 9)  Zoloft 100 Mg Tabs (Sertraline hcl) .... Take one and a half tablets by mouth every morning. 10)  Depakote Er 500 Mg Xr24h-tab (Divalproex sodium) .... Two tablets by mouth at bedtime. 11)  Lidoderm 5 % Ptch (Lidocaine) .... Please place patch over painful area for no longer than 12 hours.  do not use more than one patch at a time.

## 2011-01-14 NOTE — Progress Notes (Signed)
Summary: prior authirization/gp  Phone Note Other Incoming   Summary of Call: For Prior authorization for Zolpidem 6.25mg , pt must try 2 of the following Preferred meds. Zolpidem 5mg  or 10mg , Temazepam, Flurazepam Estazolam, or Triazolam.   Also, lidoderm patch needs to changed to  Preferrred med.   Thanks Initial call taken by: Chinita Pester RN,  December 26, 2009 2:42 PM  Follow-up for Phone Call        Changed the Remus Loffler to a preferred med.  I did not change the lidoderm patch b/c I don't know what is preferred and she is already on pain meds.  Follow-up by: [name]  MD,  January 08, 2010 8:12 AM  Additional Follow-up for Phone Call Additional follow up Details #1::        Zolpidem 5mg  [facility]. Additional Follow-up by: Chinita Pester RN,  January 08, 2010 9:29 AM    New/Updated Medications: ZOLPIDEM TARTRATE 5 MG TABS (ZOLPIDEM TARTRATE) Take 1 tab by mouth at bedtime as needed for sleep. Prescriptions: ZOLPIDEM TARTRATE 5 MG TABS (ZOLPIDEM TARTRATE) Take 1 tab by mouth at bedtime as needed for sleep.  #14 x 0   Entered by:   [name]  MD   Authorized by:   Bethel Born MD   Signed by:   [name]  MD on 01/08/2010   Method used:   Telephoned to ...       [facility]  [address] [phone]* (retail)       [address]       [place], [place]  [id number]       Ph: [phone] or [phone]       Fax: [phone]   RxID:   [id number]

## 2011-01-14 NOTE — Progress Notes (Signed)
Summary: ED call  Phone Note From Other Clinic   Reason for Call: Schedule Patient Appt Summary of Call: Patient is at [facility], with new diagnosis of ovarian vein thrombosis. Ob-Gyn recommended lovenox and coumidin, ED physician wanted to get follow up appointment tomorrow, and monitoring of Warfarin. Which we can arrange. Additionally, and improtantly, she does not have medicaid coverage any more and not able to afford lasix. I had several phone calls with case management and social work. Apperently, the program that helped people get lovenox when they could not afford is terminated. Given that this is weekend, it was hard to figure out what to do. The preliminary solution is that the ED will give one shot of lovenox and start her on coumidin. She will follow up tomorrow. And case management meanwhile will work to assist [place] with lovenox. May be setting up appointment and discussion with Dr. Alexandria Lodge may help. Initial call taken by: Clerance Lav MD,  December 20, 2010 5:42 PM     Appended Document: ED call I will get paged again tomorrow after case manager presents the required dosage for three days to her supervisor and see if this can be approved.

## 2011-01-14 NOTE — Progress Notes (Signed)
Summary: prior authorization/gp  Phone Note Outgoing Call   Summary of Call: Prior Authorization for Lidoderm Patch.  Pt needs to try Voltaren gel before the patch can be approved.  Or an appeal can be filed.  Thanks Initial call taken by: Chinita Pester RN,  January 09, 2010 10:48 AM  Follow-up for Phone Call        She can try voltaren gel then. Follow-up by: [name]  MD,  January 12, 2010 12:00 PM    New/Updated Medications: VOLTAREN 1 % GEL (DICLOFENAC SODIUM) Apply to affected area three times a day as needed for pain.  Do not use with ibuprofen or naproxen. Prescriptions: VOLTAREN 1 % GEL (DICLOFENAC SODIUM) Apply to affected area three times a day as needed for pain.  Do not use with ibuprofen or naproxen.  #1 tube x 0   Entered and Authorized by:   [name]  MD   Signed by:   [name]  MD on 01/12/2010   Method used:   [facility]        [facility]  [address] [phone]* (retail)       [address]       [place], [place]  [id number]       Ph: [phone] or [phone]       Fax: [phone]   RxID:   [id number]

## 2011-01-14 NOTE — Assessment & Plan Note (Addendum)
Summary: COU/CH  Anticoagulant Therapy Managed by: Barbera Setters. Janie Morning  PharmD CACP PCP: Danelle Berry, MD [facility] Attending: Lowella Bandy MD Indication 1: Deep vein thrombus Indication 2: Encounter for therapeutic drug monitoring  V58.83 Start date: 12/20/2010 Duration: 6 months  Patient Assessment Reviewed by: [name] PharmD  January 05, 2011 Medication review: verified warfarin dosage & schedule,verified previous prescription medications, verified doses & any changes, verified new medications, reviewed OTC medications, reviewed OTC health products-vitamins supplements etc Complications: none Dietary changes: none   Health status changes: none   Lifestyle changes: none   Recent/future hospitalizations: none   Recent/future procedures: none   Recent/future dental: none Patient Assessment Part 2:  Have you MISSED ANY DOSES or CHANGED TABLETS?  No missed Warfarin doses or changed tablets.  Have you had any BRUISING or BLEEDING ( nose or gum bleeds,blood in urine or stool)?  No reported bruising or bleeding in nose, gums, urine, stool.  Have you STARTED or STOPPED any MEDICATIONS, including OTC meds,herbals or supplements?  No other medications or herbal supplements were started or stopped.  Have you CHANGED your DIET, especially green vegetables,or ALCOHOL intake?  No changes in diet or alcohol intake.  Have you had any ILLNESSES or HOSPITALIZATIONS?  No reported illnesses or hospitalizations  Have you had any signs of CLOTTING?(chest discomfort,dizziness,shortness of breath,arms tingling,slurred speech,swelling or redness in leg)    No chest discomfort, dizziness, shortness of breath, tingling in arm, slurred speech, swelling, or redness in leg.     Treatment  Target INR: 2.0-3.0 INR: 0.9  Date: 01/05/2011 Regimen In:  70.0mg /week INR reflects regimen in: 0.9  New  Tablet strength: : 5mg  Regimen Out:     Monday: INR Tablet     Tuesday: 3 Tablet     Wednesday: 3  Tablet     Thursday: 3 Tablet      Friday: 3 Tablet  Total Weekly: 80mg /6 days mg  Next INR Due: 01/11/2011 Adjusted by: Barbera Setters. Alexandria Lodge III PharmD CACP   Return to anticoagulation clinic:  01/11/2011 Time of next visit: 1100   Comments: Discussed with Dr. Aundria Rud. Potential causes for non-response include:  Discordance of INR response to effect. This may be evaluated by performing a FACTOR X Clotting Assay. If the value is reported to be suppressed to 10 -30% of its normal activity---whatever the dose of warfarin that has been being administered may be deemed as quantum sufficient. Additionally, this test--if NOT suppressed may yield some light on the issue of COMPLIANCE, i.e. IS she taking the warfarin? Am asking lab if we still have available a "warfarin assay"--basically a qualitative and quantitative measure of warfarin in the plasma.  Will CONTINUE LMWH 1mg /kg subcutaneously q12h and INCREASE her warfarin t0 80mg /6SIX days---about 13+mg per day on average. Will re-evaluate her INR response on 30-Jan-12. Relative to her LMWH--have ordered an anti-Xa heparin assay (normal expected value if taking/therapeutic for LMWH would be 0.5 - 1.1 units/mL).  Allergies: 1)  ! * Topamax  Appended Document: Orders Update/Per Dr Alexandria Lodge [facility]    Clinical Lists Changes  Orders: Added new Test order of T-Heparin Low Molec. [phone]) - Signed Added new Test order of T- * Misc. Laboratory test [phone]) - Signed Added new Test order of T- * Misc. Laboratory test [phone]) - Signed Added new Test order of T- * Misc. Laboratory test [phone]) - Signed      Process Orders Check Orders Results:     [facility]: Order checked:      --  T-Heparin Low Molec. --  [NO CODE FOUND]  Order queued for requisitioning for [facility]: January 05, 2011 12:32 PM Tests Sent for requisitioning (January 05, 2011 12:38 PM):     01/05/2011: [facility] -- T-Heparin Low Molec. [[id number]]  (signed)     01/05/2011: [facility] -- T- * Misc. Laboratory test [phone] (signed)     01/05/2011: [facility] -- T- * Misc. Laboratory test [phone] (signed)     01/05/2011: [facility] -- T- * Misc. Laboratory test [phone] (signed)

## 2011-01-14 NOTE — Assessment & Plan Note (Signed)
Summary: opccou/gg  Anticoagulant Therapy Managed by: Barbera Setters. Janie Morning  PharmD CACP PCP: [name]  MD [facility] Attending: [name] MD, [name] Indication 1: Deep vein thrombus Indication 2: Encounter for therapeutic drug monitoring  V58.83 Start date: 12/20/2010 Duration: 6 months  Patient Assessment Reviewed by: [name] PharmD  December 21, 2010 Medication review: verified warfarin dosage & schedule,verified previous prescription medications, verified doses & any changes, verified new medications, reviewed OTC medications, reviewed OTC health products-vitamins supplements etc Complications: none Dietary changes: none   Health status changes: none   Lifestyle changes: none   Recent/future hospitalizations: none   Recent/future procedures: none   Recent/future dental: none Patient Assessment Part 2:  Have you MISSED ANY DOSES or CHANGED TABLETS?  No missed Warfarin doses or changed tablets.  Have you had any BRUISING or BLEEDING ( nose or gum bleeds,blood in urine or stool)?  No reported bruising or bleeding in nose, gums, urine, stool.  Have you STARTED or STOPPED any MEDICATIONS, including OTC meds,herbals or supplements?  No other medications or herbal supplements were started or stopped.  Have you CHANGED your DIET, especially green vegetables,or ALCOHOL intake?  No changes in diet or alcohol intake.  Have you had any ILLNESSES or HOSPITALIZATIONS?  No reported illnesses or hospitalizations  Have you had any signs of CLOTTING?(chest discomfort,dizziness,shortness of breath,arms tingling,slurred speech,swelling or redness in leg)    No chest discomfort, dizziness, shortness of breath, tingling in arm, slurred speech, swelling, or redness in leg.     Treatment  Target INR: 2.0-3.0 INR: 1.0  Date: 12/21/2010 INR reflects regimen in: 1.0  New  Tablet strength: : 5mg  Regimen Out:     Monday: 2 Tablet     Tuesday: 1 & 1/2 Tablet     Wednesday: 1 Tablet  Thursday: INR Tablet   Total Weekly: 22.5mg /3 days mg  Next INR Due: 12/24/2010 Adjusted by: Barbera Setters. Alexandria Lodge III PharmD CACP   Return to anticoagulation clinic:  12/24/2010 Time of next visit: 1100   Comments: Patient was seen at [facility], transferred to [facility] and discharged for OP treatment of pelvic thromosis. Was given ONE injection of LMWH yesterday at 7PM she states. Will recommence q12h dosing of enoxaparin/Lovenox at 1mg /kg subcutaneously q12h. I have provided her with 10 syringes of Lovenox 80mg .  Sample Given, Lovenox 80mg  Lot #:[id number] Expiration Date: 03- 2014 Patient has been instructed regarding the correct time, dose and frequency of taking this med, including desired effects and most common side effects.   Lovenox: Inclusion Criteria There will be adequate patient, caregiver, and/or home nursing support. yes Patient is stable with no obvious indication of major pulmonary embolism. yes Contraindications: History of CVA known to be hemorrhagic no Recent bleeding (eg PUD,hematuria) yes Any bleeding and/or hematological disorder (eg. coagulopathy, Hb<8.0, thrombocytopenia) no Severe uncontrolled hypertension (SBP180 DBP110) no Renal failure (Creatinine>3.0 mg/dL) and/or hepatic failure no  Allergies: 1)  ! * Topamax

## 2011-01-14 NOTE — Assessment & Plan Note (Signed)
Summary: ED f/u/gg     see notes   Vital Signs:  Patient profile:   50 year old female Height:      62 inches (157.48 cm) Weight:      173.4 pounds (78.82 kg) BMI:     31.83 Temp:     97.0 degrees F (36.11 degrees C) oral Pulse rate:   71 / minute BP sitting:   113 / 74  (left arm)  Vitals Entered By: Stanton Kidney Ditzler RN (December 21, 2010 4:13 PM) CC: vaginal bleeding, lower abdominal pain Is Patient Diabetic? No Pain Assessment Patient in pain? yes     Location: low right abd Intensity: 6-7 Type: pulling Onset of pain  past day Nutritional Status BMI of > 30 = obese Nutritional Status Detail appetite down  Have you ever been in a relationship where you felt threatened, hurt or afraid?denies   Does patient need assistance? Functional Status Self care Ambulation Normal Comments ER FU - needs GYN referral. LMP 12/04/10 and still bleeding heavy.   Primary Care Provider:  Danelle Berry, MD  CC:  vaginal bleeding and lower abdominal pain.  History of Present Illness: 50yo W with PMH of asthma, bipolar disorder who presents for follow-up of R ovarian vein thrombosis and ongoing vaginal bleeding. She was seen in [facility] ED last night with pelvic/lower abdominal pain and vaginal bleeding that has been ongoing since 12/04/2010. She was found to have R ovarian vein thrombosis on CT. Was started on warfarin and lovenox. Seen by Dr Alexandria Lodge earlier this afternoon for management of anticoagulation. Vaginal bleeding has persisted unchanged since starting anticoagulation; soaking through a feminine pad in 1 hour at its worst and merely spotting at its best. Hb in the ED last night was 14. However, she reports feeling weaker and more fatigued which she attributes to this long period of bleeding. She is very frustrated and worried about the persistent bleeding and is anxious for it to resolve.   Depression History:      The patient denies a depressed mood most of the day and a diminished  interest in her usual daily activities.         Preventive Screening-Counseling & Management  Alcohol-Tobacco     Alcohol type: [place]     Smoking Status: current     Smoking Cessation Counseling: yes     Packs/Day: 1 pf per month     Year Started: AT THE AGE OF 12  Caffeine-Diet-Exercise     Type of exercise: walks her dog  Allergies: 1)  ! * Topamax  Past History:  Past Medical History: Last updated: 11/27/2008 Asthma-2 sets of PFT's in 04/09 w/ sign variability.  Last set w/ significant decrease in FEV! w/ saline alone, suggesting asthma but recommended clinical correlation. Polysubstance abuse-None since February 27, 2008 GAD GERD chronic headaches Vision blackouts: Neg workup to include ESR, ANA, opthamology referral, carotid dopplers, 2 D echo, MRI, and                             EEG. Lower extremity edema:  Neg ABI's, normal 2D ECHO, normal albumin. DUB and pelvic pain w/ neg endomatrial bx in 07/09 and transvaginal u/s significant for mild fibroids in 04/09. 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  Chronic pain:  Normal workup includes TSH, RPR, B12, HIV,  plain films, 2 ESR's, ANA, CK, RF along w/ routine                        cbc, cmet, and ua.  Further neg workup includes  CRP, ESR, SPEP/UPEP, hepatitis serology, a1c,                        and repeat ANA.   Family History: Last updated: 03/19/2008 Father-renal dz, htn, DM Type 2 Sister-renal dz  Social History: Last updated: 04/09/2008 Lives b/w mom's house and boyfriend's house, currently trying to abstain from illegal substances, continues to smoke and says nicotine patch made her sick.   Social History: Packs/Day:  1 pf per month  Review of Systems      See HPI General:  Complains of fatigue and weakness; denies chills and fever. CV:  Denies chest pain or discomfort. Resp:  Denies shortness of  breath. GI:  Complains of abdominal pain; denies change in bowel habits. GU:  Complains of abnormal vaginal bleeding.  Physical Exam  General:  alert and cooperative to examination.   Head:  normocephalic and atraumatic.   Eyes:  vision grossly intact, pupils equal, pupils round, and pupils reactive to light.   Mouth:  pharynx pink and moist.   Neck:  supple and no masses.   Lungs:  normal breath sounds, no crackles, and no wheezes.   Heart:  normal rate, regular rhythm, no murmur, no gallop, and no rub.   Abdomen:  soft and non-tender.   Extremities:  No edema.  Neurologic:  alert & oriented X3, cranial nerves grossly intact, strength normal in all extremities, and sensation intact to light touch.   Skin:  turgor normal and no rashes.   Psych:  Oriented X3, memory intact for recent and remote, normally interactive, good eye contact, and not depressed appearing. Mildly anxious.    Impression & Recommendations:  Problem # 1:  ABNORMAL VAGINAL BLEEDING (ICD-626.9) Assessment New  Ongoing since 12/04/2010 and quite heavy at times. Increasingly concerned about ongoing bleeding in setting of anticoagulation for ovarian vein thrombosis. Not a candidate for esterogen therapy b/c of active thrombosis. Will order transvaginal ultrasound and refer to GYN for further evaluation -- she will need to be seen ASAP. She will also need close monitoring because of ongoing blood loss on anticoagulation.   Orders: Ultrasound (Ultrasound) Gynecologic Referral (Gyn)  Problem # 2:  DVT (ICD-453.40) Assessment: New  On warfarin and lovenox for tx of ovarian vein thrombosis. Followed by Dr Alexandria Lodge in anticoagulation clinic. Will refer to GYN as above. Will give toradol injection in clinic to help manage pain.   Orders: Gynecologic Referral (Gyn)  Complete Medication List: 1)  Symbicort 160-4.5 Mcg/act Aero (Budesonide-formoterol fumarate) .... One puff two times a day 2)  Proventil Hfa 108 (90 Base)  Mcg/act Aers (Albuterol sulfate) .... One puff every 6 hours as needed for wheezing 3)  Spiriva Handihaler 18 Mcg Caps (Tiotropium bromide monohydrate) .... One puff daily as directed 4)  Omeprazole 20 Mg Tbec (Omeprazole) .... Take 1 tablet by mouth once a day 5)  Naproxen 500 Mg Tabs (Naproxen) .... Take one tablet by mouth up to twice a day as needed for pain. 6)  Flexeril 5 Mg Tabs (Cyclobenzaprine hcl) .... Take 1 tab by mouth at bedtime 7)  Zolpidem Tartrate 5 Mg Tabs (Zolpidem tartrate) .... Take 1 tab by mouth at bedtime as needed for sleep. 8)  Seroquel Xr 400 Mg Xr24h-tab (Quetiapine fumarate) .... Take two tablets by mouth at 6:00 p.m. 9)  Zoloft 100 Mg Tabs (Sertraline hcl) .... Take one and a half tablets by mouth every morning. 10)  Depakote Er 500 Mg Xr24h-tab (Divalproex sodium) .... Two tablets by mouth at bedtime. 11)  Voltaren 1 % Gel (Diclofenac sodium) .... Apply to affected area three times a day as needed for pain.  do not use with ibuprofen or naproxen. 12)  Warfarin Sodium 5 Mg Tabs (Warfarin sodium) .... Tablet strength: 5mg  take as directed  Other Orders: Ketorolac-Toradol 15mg  (Z6109) Admin of Therapeutic Inj  intramuscular or subcutaneous (60454)  Patient Instructions: 1)  We will refer [facility] to [facility] for vaginal ultrasound and follow-up. 2)  Please call our clinic if your bleeding worsens.    Medication Administration  Injection # 1:    Medication: Ketorolac-Toradol 15mg     Diagnosis: PELVIC  PAIN (ICD-789.09)    Route: IM    Site: R deltoid    Exp Date: 04/12/2012    Lot #: [id number]    Mfr: Hospira    Patient tolerated injection without complications    Given by: Angelina Ok RN (December 21, 2010 5:29 PM)  Orders Added: 1)  Ketorolac-Toradol 15mg  [J1885] 2)  Admin of Therapeutic Inj  intramuscular or subcutaneous [96372] 3)  Ultrasound [Ultrasound] 4)  Gynecologic Referral [Gyn] 5)  Est. Patient Level IV [47829]     Prevention &  Chronic Care Immunizations   Influenza vaccine: Fluvax 3+  (11/25/2009)    Tetanus booster: 11/25/2009: Td    Pneumococcal vaccine: Pneumovax  (11/25/2009)  Colorectal Screening   Hemoccult: Not documented    Colonoscopy: Not documented  Other Screening   Pap smear:  Specimen Adequacy: Satisfactory for evaluation.   Interpretation/Result:Negative for intraepithelial Lesion or Malignancy.   Interpretation/Result:Trichomonas Vaginalis present.    Interpretation/Result:Reactive Changes.    (05/08/2008)   Pap smear action/deferral: Refused  (11/25/2009)   Pap smear due: 05/08/2009    Mammogram: BI-RADS CATEGORY 2:  Benign finding(s).^MM DIGITAL DIAGNOSTIC BILAT  (11/27/2009)   Mammogram action/deferral: Not indicated  (11/25/2009)   Smoking status: current  (12/21/2010)   Smoking cessation counseling: yes  (12/21/2010)  Lipids   Total Cholesterol: Not documented   Lipid panel action/deferral: Refused   LDL: Not documented   LDL Direct: Not documented   HDL: Not documented   Triglycerides: Not documented      Resource handout printed.    Medication Administration  Injection # 1:    Medication: Ketorolac-Toradol 15mg     Diagnosis: PELVIC  PAIN (ICD-789.09)    Route: IM    Site: R deltoid    Exp Date: 04/12/2012    Lot #: [id number]    Mfr: Hospira    Patient tolerated injection without complications    Given by: Angelina Ok RN (December 21, 2010 5:29 PM)  Orders Added: 1)  Ketorolac-Toradol 15mg  [J1885] 2)  Admin of Therapeutic Inj  intramuscular or subcutaneous [96372] 3)  Ultrasound [Ultrasound] 4)  Gynecologic Referral [Gyn] 5)  Est. Patient Level IV [65784]

## 2011-01-14 NOTE — Assessment & Plan Note (Signed)
Summary: 11AM COU APPT PER [name]  Anticoagulant Therapy Managed by: Barbera Setters. Kathleen [name]  PharmD CACP PCP: [name]  MD [facility] Attending: [name] MD, [name] Indication 1: Deep vein thrombus Indication 2: Encounter for therapeutic drug monitoring  V58.83 Start date: 12/20/2010 Duration: 6 months  Patient Assessment Reviewed by: [name] PharmD  December 24, 2010 Medication review: verified warfarin dosage & schedule,verified previous prescription medications, verified doses & any changes, verified new medications, reviewed OTC medications, reviewed OTC health products-vitamins supplements etc Complications: none Dietary changes: none   Health status changes: none   Lifestyle changes: none   Recent/future hospitalizations: none   Recent/future procedures: none   Recent/future dental: none Patient Assessment Part 2:  Have you MISSED ANY DOSES or CHANGED TABLETS?  No missed Warfarin doses or changed tablets.  Have you had any BRUISING or BLEEDING ( nose or gum bleeds,blood in urine or stool)?  Continues to have intermittent vaginal bleeding.  Have you STARTED or STOPPED any MEDICATIONS, including OTC meds,herbals or supplements?  No other medications or herbal supplements were started or stopped.  Have you CHANGED your DIET, especially green vegetables,or ALCOHOL intake?  No changes in diet or alcohol intake.  Have you had any ILLNESSES or HOSPITALIZATIONS?  No reported illnesses or hospitalizations  Have you had any signs of CLOTTING?(chest discomfort,dizziness,shortness of breath,arms tingling,slurred speech,swelling or redness in leg)    No chest discomfort, dizziness, shortness of breath, tingling in arm, slurred speech, swelling, or redness in leg.     Treatment  Target INR: 2.0-3.0 INR: 1.0  Date: 12/24/2010 Regimen In:  22.5mg /3 days INR reflects regimen in: 1.0  New  Tablet strength: : 5mg  Regimen Out:     Sunday: 1 & 1/2 Tablet     Monday: INR Tablet    Thursday: 2 Tablet      Friday: 2 Tablet     Saturday: 1 & 1/2 Tablet Total Weekly: 35mg /4 days mg  Next INR Due: 12/29/2010 Adjusted by: Barbera Setters. Alexandria Lodge III PharmD CACP   Return to anticoagulation clinic:  12/29/2010 Time of next visit: 1100   Comments: Will give 10mg  warfarin  today, tommorrow (Friday); 7.5mg  warfarin  on Saturday and Sunday; 5mg  warfarin on Monday (Clinic is closed) and have patient RTC on TUESDAY 17-Jan-12 at 1100h.  She will continue enoxaparin 1mg /kg subcutaneously q12h until seen in [facility] on Tuesday.  Allergies: 1)  ! * Topamax

## 2011-01-14 NOTE — Assessment & Plan Note (Signed)
Summary: abd pain/gg   Vital Signs:  Patient profile:   50 year old female Height:      62 inches (157.48 cm) Weight:      180.7 pounds (82.14 kg) BMI:     33.17 Temp:     97.8 degrees F (36.56 degrees C) oral Pulse rate:   68 / minute BP sitting:   110 / 78  (left arm)  Vitals Entered By: Stanton Kidney Ditzler RN (December 29, 2010 4:16 PM) Is Patient Diabetic? No Pain Assessment Patient in pain? yes     Location: abdomen Intensity: 8 Type: sharp Onset of pain  since 11/2010 Nutritional Status BMI of > 30 = obese Nutritional Status Detail appetite down  Have you ever been in a relationship where you felt threatened, hurt or afraid?denies   Does patient need assistance? Functional Status Self care Ambulation Normal Comments Boyfriend with pt. Refill pain med and discuss labs.   Primary Care Provider:  Danelle Berry, MD   History of Present Illness: 50yo W presents for follow-up of R ovarian vein thrombosis, vaginal bleeding, and abdominal pain. Vaginal bleeding finally seems to have subsided after several weeks -- she is currently just spotting. She is currently still on lovenox and warfarin for tx of ovarian vein thrombosis and follows with Dr Alexandria Lodge. Her INR is still subtherapeutic (INR<1) despite a week of treatment. She denies missing any doses of warfarin. At her initial visit, she seemed somewhat fearful of anticoagulation b/c of ongoing vaginal bleeding; however, she denies such concerns today. She has an appointment to follow-up with GYN later this month. She continues to complain of lower abdominal/pelvic pain. She was prescribed #20 percocet in the ED but she has used all of those pills. She has a history of chronic pelvic pain but she insists that this pain is different ("feels like knots") and started the same time as the vaginal bleeding.   Depression History:      The patient denies a depressed mood most of the day and a diminished interest in her usual daily activities.          Preventive Screening-Counseling & Management  Alcohol-Tobacco     Alcohol type: [place]     Smoking Status: current     Smoking Cessation Counseling: yes     Packs/Day: 1 pk per week     Year Started: AT THE AGE OF 12  Caffeine-Diet-Exercise     Type of exercise: walks her dog  Current Medications (verified): 1)  Symbicort 160-4.5 Mcg/act  Aero (Budesonide-Formoterol Fumarate) .... One Puff Two Times A Day 2)  Proventil Hfa 108 (90 Base) Mcg/act  Aers (Albuterol Sulfate) .... One Puff Every 6 Hours As Needed For Wheezing 3)  Spiriva Handihaler 18 Mcg  Caps (Tiotropium Bromide Monohydrate) .... One Puff Daily As Directed 4)  Omeprazole 20 Mg  Tbec (Omeprazole) .... Take 1 Tablet By Mouth Once A Day 5)  Naproxen 500 Mg  Tabs (Naproxen) .... Take One Tablet By Mouth Up To Twice A Day As Needed For Pain. 6)  Flexeril 5 Mg  Tabs (Cyclobenzaprine Hcl) .... Take 1 Tab By Mouth At Bedtime 7)  Zolpidem Tartrate 5 Mg Tabs (Zolpidem Tartrate) .... Take 1 Tab By Mouth At Bedtime As Needed For Sleep. 8)  Seroquel Xr 400 Mg Xr24h-Tab (Quetiapine Fumarate) .... Take Two Tablets By Mouth At 6:00 P.m. 9)  Zoloft 100 Mg Tabs (Sertraline Hcl) .... Take One and A Half Tablets By Mouth Every Morning. 10)  Depakote  Er 500 Mg Xr24h-Tab (Divalproex Sodium) .... Two Tablets By Mouth At Bedtime. 11)  Voltaren 1 % Gel (Diclofenac Sodium) .... Apply To Affected Area Three Times A Day As Needed For Pain.  Do Not Use With Ibuprofen or Naproxen. 12)  Warfarin Sodium 5 Mg Tabs (Warfarin Sodium) .... Tablet Strength: 5mg  Take As Directed 13)  Vicodin 5-500 Mg Tabs (Hydrocodone-Acetaminophen) .... Take 1 Tablet Every 4-6 Hours As Needed For Pain  Allergies: 1)  ! * Topamax  Past History:  Past Medical History: Last updated: 11/27/2008 Asthma-2 sets of PFT's in 04/09 w/ sign variability.  Last set w/ significant decrease in FEV! w/ saline alone, suggesting asthma but recommended clinical  correlation. Polysubstance abuse-None since February 27, 2008 GAD GERD chronic headaches Vision blackouts: Neg workup to include ESR, ANA, opthamology referral, carotid dopplers, 2 D echo, MRI, and                             EEG. Lower extremity edema:  Neg ABI's, normal 2D ECHO, normal albumin. DUB and pelvic pain w/ neg endomatrial bx in 07/09 and transvaginal u/s significant for mild fibroids in 04/09. 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  Chronic pain:  Normal workup includes TSH, RPR, B12, HIV, plain films, 2 ESR's, ANA, CK, RF along w/ routine                        cbc, cmet, and ua.  Further neg workup includes  CRP, ESR, SPEP/UPEP, hepatitis serology, a1c,                        and repeat ANA.   Past Surgical History: Last updated: 03/19/2008 L partial oopherectomy c-section hernia repair  Family History: Last updated: 03/19/2008 Father-renal dz, htn, DM Type 2 Sister-renal dz  Social History: Last updated: 04/09/2008 Lives b/w mom's house and boyfriend's house, currently trying to abstain from illegal substances, continues to smoke and says nicotine patch made her sick.   Social History: Packs/Day:  1 pk per week  Review of Systems      See HPI General:  Complains of malaise and weakness; denies chills and fever. CV:  Denies chest pain or discomfort. Resp:  Denies shortness of breath. GI:  Complains of abdominal pain; denies change in bowel habits, nausea, and vomiting. GU:  Complains of abnormal vaginal bleeding. Neuro:  Denies numbness and tingling. Psych:  Denies depression.  Physical Exam  General:  alert and cooperative to examination.   Head:  normocephalic and atraumatic.   Eyes:  vision grossly intact, pupils equal, pupils round, and pupils reactive to light.   Mouth:  pharynx pink and moist.   Neck:  supple and no masses.   Lungs:  normal  breath sounds, no crackles, and no wheezes.   Heart:  normal rate, regular rhythm, no murmur, no gallop, and no rub.   Abdomen:  soft and normal bowel sounds. mildly tender in lower quadrants. no rebound or guarding.   Pulses:  2+ peripheral pulses Extremities:  No edema.  Neurologic:  alert & oriented X3, cranial nerves grossly intact, strength normal in all extremities, sensation intact to light touch, and gait normal.   Skin:  turgor normal and no rashes.   Psych:  Oriented X3, memory intact for  recent and remote, normally interactive, good eye contact, not anxious appearing, and not depressed appearing.     Impression & Recommendations:  Problem # 1:  ABNORMAL VAGINAL BLEEDING (ICD-626.9) After several weeks of vaginal bleeding, this seems to finally be stopping. Patient reports only slight spotting at present. Transvaginal U/S showed small intramural fibroids but otherwise normal. Will follow-up with GYN later this month. Still has significant pelvic pain -- will prescribe small quantity of vicodin for short term use and encourage patient to also take NSAIDS.   Problem # 2:  DVT (ICD-453.40) Still subtherapeutic on warfarin (INR<1) despite a week of therapy. Patient denies missing doses; she also denies any reluctance to anticoagulate in setting of bleeding. Followed by Dr. Alexandria Lodge. He has continued her lovenox and increased warfarin dose. If INR still subtherapeutic next week, he may check factor X levels. Will follow-up with GYN clinic later this month as discussed above.   Complete Medication List: 1)  Symbicort 160-4.5 Mcg/act Aero (Budesonide-formoterol fumarate) .... One puff two times a day 2)  Proventil Hfa 108 (90 Base) Mcg/act Aers (Albuterol sulfate) .... One puff every 6 hours as needed for wheezing 3)  Spiriva Handihaler 18 Mcg Caps (Tiotropium bromide monohydrate) .... One puff daily as directed 4)  Omeprazole 20 Mg Tbec (Omeprazole) .... Take 1 tablet by mouth once a day 5)   Naproxen 500 Mg Tabs (Naproxen) .... Take one tablet by mouth up to twice a day as needed for pain. 6)  Flexeril 5 Mg Tabs (Cyclobenzaprine hcl) .... Take 1 tab by mouth at bedtime 7)  Zolpidem Tartrate 5 Mg Tabs (Zolpidem tartrate) .... Take 1 tab by mouth at bedtime as needed for sleep. 8)  Seroquel Xr 400 Mg Xr24h-tab (Quetiapine fumarate) .... Take two tablets by mouth at 6:00 p.m. 9)  Zoloft 100 Mg Tabs (Sertraline hcl) .... Take one and a half tablets by mouth every morning. 10)  Depakote Er 500 Mg Xr24h-tab (Divalproex sodium) .... Two tablets by mouth at bedtime. 11)  Voltaren 1 % Gel (Diclofenac sodium) .... Apply to affected area three times a day as needed for pain.  do not use with ibuprofen or naproxen. 12)  Warfarin Sodium 5 Mg Tabs (Warfarin sodium) .... Tablet strength: 5mg  take as directed 13)  Vicodin 5-500 Mg Tabs (Hydrocodone-acetaminophen) .... Take 1 tablet every 4-6 hours as needed for pain  Patient Instructions: 1)  Please follow-up with the gynecology clinic as previously scheduled.  2)  I have prescribed some pain medicine to help with your cramps until you are seen by the gynecologist. I recommend also taking ibuprofen as this will also help alleviate your pain.  Prescriptions: VICODIN 5-500 MG TABS (HYDROCODONE-ACETAMINOPHEN) Take 1 tablet every 4-6 hours as needed for pain  #30 x 0   Entered and Authorized by:   Whitney Post MD   Signed by:   Whitney Post MD on 12/29/2010   Method used:   Reprint   RxID:   [id number] VICODIN 5-500 MG TABS (HYDROCODONE-ACETAMINOPHEN)   #30 x 0   Entered and Authorized by:   Whitney Post MD   Signed by:   Whitney Post MD on 12/29/2010   Method used:   Print then Give to Patient   RxID:   [id number]    Orders Added: 1)  Est. Patient Level IV [64332]     Prevention & Chronic Care Immunizations   Influenza vaccine: Fluvax 3+  (11/25/2009)    Tetanus booster: 11/25/2009: Td    Pneumococcal  vaccine: Pneumovax   (11/25/2009)  Colorectal Screening   Hemoccult: Not documented    Colonoscopy: Not documented  Other Screening   Pap smear:  Specimen Adequacy: Satisfactory for evaluation.   Interpretation/Result:Negative for intraepithelial Lesion or Malignancy.   Interpretation/Result:Trichomonas Vaginalis present.    Interpretation/Result:Reactive Changes.    (05/08/2008)   Pap smear action/deferral: Refused  (11/25/2009)   Pap smear due: 05/08/2009    Mammogram: BI-RADS CATEGORY 2:  Benign finding(s).^MM DIGITAL DIAGNOSTIC BILAT  (11/27/2009)   Mammogram action/deferral: Not indicated  (11/25/2009)   Smoking status: current  (12/29/2010)   Smoking cessation counseling: yes  (12/29/2010)  Lipids   Total Cholesterol: Not documented   Lipid panel action/deferral: Refused   LDL: Not documented   LDL Direct: Not documented   HDL: Not documented   Triglycerides: Not documented      Resource handout printed.

## 2011-01-14 NOTE — Assessment & Plan Note (Signed)
Summary: est-ck/fu/meds/cfb   Vital Signs:  Patient profile:   50 year old female Height:      62 inches (157.48 cm) Weight:      182.02 pounds (82.74 kg) BMI:     33.41 O2 Sat:      99 % Temp:     98.9 degrees F (37.17 degrees C) oral Pulse rate:   91 / minute BP sitting:   114 / 81  (right arm)  Vitals Entered By: Angelina Ok RN (January 21, 2010 3:44 PM) Is Patient Diabetic? No Pain Assessment Patient in pain? yes     Location: HEART, HIP Intensity: 6/9 Type: sharp Onset of pain  HAPPENS 3 OR 4 TIMES A DAY Nutritional Status BMI of > 30 = obese  Have you ever been in a relationship where you felt threatened, hurt or afraid?No   Does patient need assistance? Functional Status Self care Ambulation Normal Comments Out of Flexeril and stomach medicine.  Heart pain    Primary Care Provider:  [name]  MD   History of Present Illness: Pt is a 50 yo female w/ past med hx here for routine f/u.  She notes intermittent L sided chest pain that is intermittent in nature.  It lasts for a few seconds, is sharp and stabbing in nature, feels like a pinprick, no associated nausea, SOB, or diaphosesis.  It occurs 5 x's a day.  She feels like she may have a panic attack with it.  While she has used cocaine in the past, she has not used any recently.  Pain is not related to exertion at all.  She does note occasional night sweats and not flashes and her periods are irregular and thinks she is perimenopausal.   She wants to see a gynecologist and wants her tubes tied.     Current Medications (verified): 1)  Symbicort 160-4.5 Mcg/act  Aero (Budesonide-Formoterol Fumarate) .... One Puff Two Times A Day 2)  Proventil Hfa 108 (90 Base) Mcg/act  Aers (Albuterol Sulfate) .... One Puff Every 6 Hours As Needed For Wheezing 3)  Spiriva Handihaler 18 Mcg  Caps (Tiotropium Bromide Monohydrate) .... One Puff Daily As Directed 4)  Omeprazole 20 Mg  Tbec (Omeprazole) .... Take 1 Tablet By  Mouth Once A Day 5)  Naproxen 500 Mg  Tabs (Naproxen) .... Take One Tablet By Mouth Up To Twice A Day As Needed For Pain. 6)  Flexeril 5 Mg  Tabs (Cyclobenzaprine Hcl) .... Take 1 Tab By Mouth At Bedtime 7)  Zolpidem Tartrate 5 Mg Tabs (Zolpidem Tartrate) .... Take 1 Tab By Mouth At Bedtime As Needed For Sleep. 8)  Seroquel Xr 400 Mg Xr24h-Tab (Quetiapine Fumarate) .... Take Two Tablets By Mouth At 6:00 P.m. 9)  Zoloft 100 Mg Tabs (Sertraline Hcl) .... Take One and A Half Tablets By Mouth Every Morning. 10)  Depakote Er 500 Mg Xr24h-Tab (Divalproex Sodium) .... Two Tablets By Mouth At Bedtime. 11)  Voltaren 1 % Gel (Diclofenac Sodium) .... Apply To Affected Area Three Times A Day As Needed For Pain.  Do Not Use With Ibuprofen or Naproxen.  Allergies (verified): 1)  ! * Topamax  Past History:  Past Medical History: Last updated: 11/27/2008 Asthma-2 sets of PFT's in 04/09 w/ sign variability.  Last set w/ significant decrease in FEV! w/ saline alone, suggesting asthma but recommended clinical correlation. Polysubstance abuse-None since February 27, 2008 GAD GERD chronic headaches Vision blackouts: Neg workup to include ESR, ANA, opthamology referral, carotid  dopplers, 2 D echo, MRI, and                             EEG. Lower extremity edema:  Neg ABI's, normal 2D ECHO, normal albumin. DUB and pelvic pain w/ neg endomatrial bx in 07/09 and transvaginal u/s significant for mild fibroids in 04/09. 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  Chronic pain:  Normal workup includes TSH, RPR, B12, HIV, plain films, 2 ESR's, ANA, CK, RF along w/ routine                        cbc, cmet, and ua.  Further neg workup includes  CRP, ESR, SPEP/UPEP, hepatitis serology, a1c,                        and repeat ANA.   Past Surgical History: Last updated: 03/19/2008 L partial  oopherectomy c-section hernia repair  Social History: Last updated: 04/09/2008 Lives b/w mom's house and boyfriend's house, currently trying to abstain from illegal substances, continues to smoke and says nicotine patch made her sick.   Risk Factors: Smoking Status: current (11/25/2009) Packs/Day: 3 cigs per day (11/25/2009)  Social History: Reviewed history from 04/09/2008 and no changes required. Lives b/w mom's house and boyfriend's house, currently trying to abstain from illegal substances, continues to smoke and says nicotine patch made her sick.   Review of Systems       As per HPI.   Physical Exam  General:  alert, well-developed, and normal appearance.   Eyes:  anicteric, wearing glasses. Mouth:  MMM, fair dentition. Neck:  no LAD, no JVD. Lungs:  normal respiratory effort, normal breath sounds, and no wheezes.   Heart:  normal rate, regular rhythm, no murmur, no gallop, and no rub.   Abdomen:  +BS's, soft, NT, and ND. Pulses:  peripheral pulses normal. Extremities:  no peripheral edema. Neurologic:  alert & oriented X3, gait normal.     Impression & Recommendations:  Problem # 1:  CHEST PAIN, ATYPICAL (ICD-786.59) Risk factors for cardiac causes include smoking and obesity.  No hx of HLD, family hx, diabetes, or htn.  Hx of pain lasts seconds and would be very atypical for cardiac dz.  EKG without ischemic changes, so doubt cardiac causes.  Recent CXR makes pulm causes, such as ptx, unlikely since she was having the pain when she got the CXR (eval'd in ER for asthma exac).  Hx not c/w PE.  Likely msk vs anxiety.  Given hx of substance abuse, will not prescribe benzo at this time but can f/u w/ psych.  Advised to return if pain does not resolve or ever lasts more than a few seconds.  Will check lipids to risk stratify and check TSH and UDS.  Orders: T-Comprehensive Metabolic Panel [phone]) T-Lipid Profile [phone]) T-TSH [id number]) T-Drug Screen-Urine,  (single) ([id number])  Problem # 2:  DRUG ABUSE (ICD-305.90) F/u UDS.  Has done a great job of staying drug free in the last few years.  Problem # 3:  BACK PAIN (ICD-724.5) Not using naproxen very often.  Cont flexeril.  Her updated medication list for this problem includes:    Naproxen 500 Mg Tabs (Naproxen) .Marland Kitchen... Take one tablet by mouth up to twice a day as needed for pain.  Flexeril 5 Mg Tabs (Cyclobenzaprine hcl) .Marland Kitchen... Take 1 tab by mouth at bedtime  Problem # 4:  GERD (ICD-530.81) Cont PPI.    Her updated medication list for this problem includes:    Omeprazole 20 Mg Tbec (Omeprazole) .Marland Kitchen... Take 1 tablet by mouth once a day  Problem # 5:  CONTRACEPTIVE MANAGEMENT (ICD-V25.09) Pt is concerned she may get pregnant at some point and wants her tubes tied.  She is perimenopausal presently and it would be uncommon for this to occur.  I councelled on condom use and she would prefer to get her tubes tied and would like gyn referral.  Gyn referral placed to discuss BTL.  Orders: Gynecologic Referral (Gyn)  Problem # 6:  WEIGHT GAIN (ICD-783.1) Gain started after starting depakote and seroquel by mental health. Discussed weight loss strategies.  Recommended increase exercise and Healthy Lifestyles Class.    Problem # 7:  BIPOLAR DISORDER UNSPECIFIED (ICD-296.80) Mood stable.  Cont meds per Mental Health.  Complete Medication List: 1)  Symbicort 160-4.5 Mcg/act Aero (Budesonide-formoterol fumarate) .... One puff two times a day 2)  Proventil Hfa 108 (90 Base) Mcg/act Aers (Albuterol sulfate) .... One puff every 6 hours as needed for wheezing 3)  Spiriva Handihaler 18 Mcg Caps (Tiotropium bromide monohydrate) .... One puff daily as directed 4)  Omeprazole 20 Mg Tbec (Omeprazole) .... Take 1 tablet by mouth once a day 5)  Naproxen 500 Mg Tabs (Naproxen) .... Take one tablet by mouth up to twice a day as needed for pain. 6)  Flexeril 5 Mg Tabs (Cyclobenzaprine hcl) .... Take 1 tab by  mouth at bedtime 7)  Zolpidem Tartrate 5 Mg Tabs (Zolpidem tartrate) .... Take 1 tab by mouth at bedtime as needed for sleep. 8)  Seroquel Xr 400 Mg Xr24h-tab (Quetiapine fumarate) .... Take two tablets by mouth at 6:00 p.m. 9)  Zoloft 100 Mg Tabs (Sertraline hcl) .... Take one and a half tablets by mouth every morning. 10)  Depakote Er 500 Mg Xr24h-tab (Divalproex sodium) .... Two tablets by mouth at bedtime. 11)  Voltaren 1 % Gel (Diclofenac sodium) .... Apply to affected area three times a day as needed for pain.  do not use with ibuprofen or naproxen.  Other Orders: 12 Lead EKG (12 Lead EKG)  Patient Instructions: 1)  Please make an appointment in 6 months for a checkup. 2)  Please go to the Healthy Lifestyles class for weight loss. 3)  Call sooner if your chest pain persists. 4)  You will be called with any abnormal labwork.  Please make sure your phone number is correct at the front desk.  Prevention & Chronic Care Immunizations   Influenza vaccine: Fluvax 3+  (11/25/2009)    Tetanus booster: 11/25/2009: Td    Pneumococcal vaccine: Pneumovax  (11/25/2009)  Other Screening   Pap smear:  Specimen Adequacy: Satisfactory for evaluation.   Interpretation/Result:Negative for intraepithelial Lesion or Malignancy.   Interpretation/Result:Trichomonas Vaginalis present.    Interpretation/Result:Reactive Changes.    (05/08/2008)   Pap smear action/deferral: Refused  (11/25/2009)   Pap smear due: 05/08/2009    Mammogram: BI-RADS CATEGORY 2:  Benign finding(s).^MM DIGITAL DIAGNOSTIC BILAT  (11/27/2009)   Mammogram action/deferral: Not indicated  (11/25/2009)   Smoking status: current  (11/25/2009)   Smoking cessation counseling: yes  (11/25/2009)  Lipids   Total Cholesterol: Not documented   Lipid panel action/deferral: Refused   LDL: Not documented   LDL Direct: Not documented   HDL: Not documented   Triglycerides:  Not documented  Process Orders Check Orders Results:      [facility]: ABN not required for this insurance Tests Sent for requisitioning (January 21, 2010 5:05 PM):     01/21/2010: [facility] -- T-Comprehensive Metabolic Panel [[id number]] (signed)     01/21/2010: [facility] -- T-Lipid Profile [phone] (signed)     01/21/2010: [facility] -- T-TSH [id number] (signed)     01/21/2010: [facility] -- T-Drug Screen-Urine, (single) [[id number]] (signed)

## 2011-01-14 NOTE — Progress Notes (Signed)
Summary: phone call/gp  Phone Note Call from Patient   Caller: Patient Summary of Call: Pt. stated she missed her appt. w/Jay Groce yesterday (I did not see one); she said usually her appts. are on Tuesdays not Mondays. But, she c/o bilateral back pain- kidney area; both arms feels heavy to lift. She only has 2 Vicodin pills left.  No Coumdin pills left. And 1 Lovenox injection  for today at 1100am. States she started bleeding  12/04/10 but no bleeding  the last 5 days but feels like her menstrual will be starting soon; she does have an appt. Thurs. at [facility]. Pt. wants to know if she needs to come in for INR. Also request Vicodin refill for the pain- last refill  as 1/17 #30.   Please advise. Initial call taken by: Chinita Pester RN,  January 05, 2011 10:15 AM  Follow-up for Phone Call        She was to have seen Dr. Alexandria Lodge yesterday; still needs to see him.  Regarding pain:  We have not intended chronic opiates and have no diagnosis to justify this.  Will prescribe 8 vicodin tabs w/o refills to cover until her gyn app't Thursday (which she must not miss).   Follow-up by: Ulyess Mort MD,  January 05, 2011 11:03 AM  Additional Follow-up for Phone Call Additional follow up Details #1::        Dr. Alexandria Lodge will be able to see the pt. if she comes now b/c he has a mtg later today.  I called the pt.and she will be hee. Additional Follow-up by: Chinita Pester RN,  January 05, 2011 11:14 AM    Additional Follow-up for Phone Call Additional follow up Details #2::    Pt. here to see Dr. Alexandria Lodge.  Vicodin Rx will be called to [facility] on [address] per her request. Also instructed pt. to keep her appt. Thurs. at [facility]. Follow-up by: Chinita Pester RN,  January 05, 2011 11:49 AM  Prescriptions: VICODIN 5-500 MG TABS (HYDROCODONE-ACETAMINOPHEN) Take 1 tablet every 4-6 hours as needed for pain  #8 x 0   Entered and Authorized by:   Ulyess Mort MD   Signed by:   Ulyess Mort MD on 01/05/2011  Method used:   Telephoned to ...       [facility]  [address] [phone]* (retail)       [address]       [place], [place]  [id number]       Ph: [phone] or [phone]       Fax: [phone]   RxID:   [id number]

## 2011-01-18 ENCOUNTER — Ambulatory Visit (INDEPENDENT_AMBULATORY_CARE_PROVIDER_SITE_OTHER): Payer: Medicaid Other | Admitting: Pharmacist

## 2011-01-18 DIAGNOSIS — Z7901 Long term (current) use of anticoagulants: Secondary | ICD-10-CM

## 2011-01-18 DIAGNOSIS — I82409 Acute embolism and thrombosis of unspecified deep veins of unspecified lower extremity: Secondary | ICD-10-CM

## 2011-01-18 LAB — POCT INR: INR: 0.8

## 2011-01-18 NOTE — Progress Notes (Signed)
Continue self-injection of Lovenox 80mg  sq q12h. Lovenox provided (lot number [id number]) and expires 02-2013. Have provided 10 syringes. Patient self-injects and has demonstrated proficiency at this.  She had been instructed by her Gynecologist to DISCONTINUE warfarin at THEIR last visit together. I subsequently, in the setting of an acute VTE--instructed the patient to recommence warfarin. There has been the question of warfarin resistance necessitating 125mg /week of warfarin. She most recently had re-interruption of warfarin due to inability to pay. At all times she has been OFF of her warfarin--she HAS been self injection Lovenox.

## 2011-01-18 NOTE — Patient Instructions (Signed)
Continue self-injection of Lovenox 80mg  sq q12h. Lovenox provided (lot number [id number]) and expires 02-2013. Have provided 10 syringes. Patient self-injects and has demonstrated proficiency at this.

## 2011-01-20 NOTE — Assessment & Plan Note (Signed)
Summary: COU/CH  Anticoagulant Therapy Managed by: Barbera Setters. Kathleen [name]  PharmD CACP PCP: Danelle Berry, MD [facility] Attending: Reche Dixon MD, Onalee Hua Indication 1: Deep vein thrombus Indication 2: Encounter for therapeutic drug monitoring  V58.83 Start date: 12/20/2010 Duration: 6 months  Patient Assessment Reviewed by: [name] PharmD  January 11, 2011 Medication review: verified warfarin dosage & schedule,verified previous prescription medications, verified doses & any changes, verified new medications, reviewed OTC medications, reviewed OTC health products-vitamins supplements etc Complications: none Dietary changes: none   Health status changes: none   Lifestyle changes: none   Recent/future hospitalizations: none   Recent/future procedures: none   Recent/future dental: none Patient Assessment Part 2:  Have you MISSED ANY DOSES or CHANGED TABLETS?  No missed Warfarin doses or changed tablets.  Have you had any BRUISING or BLEEDING ( nose or gum bleeds,blood in urine or stool)?  No reported bruising or bleeding in nose, gums, urine, stool.  Have you STARTED or STOPPED any MEDICATIONS, including OTC meds,herbals or supplements?  No other medications or herbal supplements were started or stopped.  Have you CHANGED your DIET, especially green vegetables,or ALCOHOL intake?  No changes in diet or alcohol intake.  Have you had any ILLNESSES or HOSPITALIZATIONS?  No reported illnesses or hospitalizations  Have you had any signs of CLOTTING?(chest discomfort,dizziness,shortness of breath,arms tingling,slurred speech,swelling or redness in leg)    No chest discomfort, dizziness, shortness of breath, tingling in arm, slurred speech, swelling, or redness in leg.     Treatment  Target INR: 2.0-3.0 INR: 1.0  Date: 01/11/2011 Regimen In:  80mg /6 days INR reflects regimen in: 1.0  New  Tablet strength: : 5mg  Regimen Out:     Sunday: 3 Tablet     Monday: 4 Tablet     Tuesday: 4  Tablet     Wednesday: 4 Tablet     Thursday: 4 Tablet      Friday: 3 Tablet     Saturday: 3 Tablet Total Weekly: 125mg /wk mg  Next INR Due: 01/18/2011 Adjusted by: Barbera Setters. Alexandria Lodge III PharmD CACP   Return to anticoagulation clinic:  01/18/2011 Time of next visit: 1130   Comments: Review of all special coag labs ordered last week indicate that she (by warfarin assay) is taking "some" warfarin, i.e. there is presence of warfarin in her body--qualitatively. Quantitatively, the range is 1.0 - 10.0   her value was 1.3   She DID have a therapeutic anti-Xa heparin level on Lovenox documented. The physician at [facility] has instructed the patient to DISCONTINUE the Lovenox--citing concern over potentiating internal bleeding with combined anticoagulants (warfarin + Lovenox). Her Factor X assay was low-normal. We want 10-30% activity. Her's was 77%, i.e. we have not yet suppressed her Clotting Factor X to the level we would hope for with warfarin. She continues to have an INR reflecting "baseline". Perhaps this is indeed a discordance between the surogate marker (INR) and the documented presence of "some" warfarin as per her warfarin assay. 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 Have increased her dose of warfarin and refilled her warfarin. Will see her in clinic next Monday.  Allergies: 1)  ! * Topamax

## 2011-01-25 ENCOUNTER — Ambulatory Visit (INDEPENDENT_AMBULATORY_CARE_PROVIDER_SITE_OTHER): Payer: Medicaid Other | Admitting: Pharmacist

## 2011-01-25 DIAGNOSIS — I82409 Acute embolism and thrombosis of unspecified deep veins of unspecified lower extremity: Secondary | ICD-10-CM

## 2011-01-25 DIAGNOSIS — Z7901 Long term (current) use of anticoagulants: Secondary | ICD-10-CM

## 2011-01-25 NOTE — Progress Notes (Signed)
Anti-Coagulation Progress Note  Kathleen [name] is a 50 y.o. female who is currently on an anti-coagulation regimen.    RECENT RESULTS: Recent results are below, the most recent result is correlated with a dose of 0 mg. per week: Lab Results  Component Value Date   INR 0.9 01/25/2011   INR 0.8 01/18/2011   INR 1.0 01/11/2011    ANTI-COAG DOSE:   Latest dosing instructions   Total Sun Mon Tue Wed Thu Fri Sat   0 Hold Hold Hold Hold Hold Hold Hold    Hold Hold Hold Hold Hold Hold Hold         ANTICOAG SUMMARY: Anticoagulation Episode Summary              Current INR goal  Next INR check 02/08/2011   INR from last check 0.9 (01/25/2011)     Weekly max dose (mg)  Target end date 06/19/2011   Indications DVT (deep venous thrombosis), Long term current use of anticoagulant   INR check location  Preferred lab    Send INR reminders to ANTICOAG IMP   Comments        Provider Role Specialty Phone number   Blanch Media  Internal Medicine [phone]        ANTICOAG TODAY: Anticoagulation Summary as of 01/25/2011              INR goal      Selected INR 0.9 (01/25/2011) Next INR check 02/08/2011   Weekly max dose (mg)  Target end date 06/19/2011   Indications DVT (deep venous thrombosis), Long term current use of anticoagulant    Anticoagulation Episode Summary              INR check location  Preferred lab    Send INR reminders to ANTICOAG IMP   Comments        Provider Role Specialty Phone number   Blanch Media  Internal Medicine [phone]        PATIENT INSTRUCTIONS: Patient Instructions  Continue injection 80mg  Lovenox every 12 hours until 16-Feb-12. On 17-Feb-12, commence injecting 40mg  subcutaneously every 24 hours. Syringes have been provided to you.    FOLLOW-UP Return in 2 weeks (on 02/08/2011).  Hulen Luster, III Pharm.D., CACP

## 2011-01-25 NOTE — Progress Notes (Signed)
Reviewed patient's PT/INR results and agree with treatment plan.  

## 2011-01-25 NOTE — Patient Instructions (Signed)
Continue injection 80mg  Lovenox every 12 hours until 16-Feb-12. On 17-Feb-12, commence injecting 40mg  subcutaneously every 24 hours. Syringes have been provided to you.

## 2011-02-08 ENCOUNTER — Ambulatory Visit (INDEPENDENT_AMBULATORY_CARE_PROVIDER_SITE_OTHER): Payer: Medicaid Other | Admitting: Pharmacist

## 2011-02-08 DIAGNOSIS — I82409 Acute embolism and thrombosis of unspecified deep veins of unspecified lower extremity: Secondary | ICD-10-CM

## 2011-02-08 MED ORDER — ENOXAPARIN SODIUM 40 MG/0.4ML ~~LOC~~ SOLN: 40.0000 mg | Freq: Every day | SUBCUTANEOUS | Status: AC

## 2011-02-08 NOTE — Patient Instructions (Signed)
Continue 40mg  Lovenox subcutaneously administered every 24 hours. Patient has been provided an additional 40 syringes.

## 2011-02-08 NOTE — Progress Notes (Signed)
Patient continues Lovenox 40mg  sq q24h until end of treatment on 2-Apr-12.

## 2011-03-11 ENCOUNTER — Ambulatory Visit: Payer: Medicaid Other | Admitting: Physician Assistant

## 2011-03-11 ENCOUNTER — Ambulatory Visit: Payer: Medicaid Other | Admitting: Obstetrics & Gynecology

## 2011-03-15 ENCOUNTER — Ambulatory Visit (INDEPENDENT_AMBULATORY_CARE_PROVIDER_SITE_OTHER): Payer: Medicaid Other | Admitting: Pharmacist

## 2011-03-15 DIAGNOSIS — I82409 Acute embolism and thrombosis of unspecified deep veins of unspecified lower extremity: Secondary | ICD-10-CM

## 2011-03-15 NOTE — Patient Instructions (Signed)
Patient instructed to continue to self-inject Lovenox 40mg  subcutaneously once-daily. Patient instructed to take today's dose.  Patient verbalized understanding of these instructions.  I will call with results of the heparin level drawn today.

## 2011-03-15 NOTE — Progress Notes (Signed)
Patient is using LMWH 40mg  (enoxaparin) SQ once-daily as continued prophylaxis against recurrence of her initial index VTE.

## 2011-03-29 ENCOUNTER — Ambulatory Visit: Payer: Medicaid Other | Admitting: Family Medicine

## 2011-03-29 ENCOUNTER — Other Ambulatory Visit: Payer: Self-pay | Admitting: Family Medicine

## 2011-03-29 DIAGNOSIS — N882 Stricture and stenosis of cervix uteri: Secondary | ICD-10-CM

## 2011-03-29 DIAGNOSIS — R87616 Satisfactory cervical smear but lacking transformation zone: Secondary | ICD-10-CM

## 2011-03-29 DIAGNOSIS — Z0142 Encounter for cervical smear to confirm findings of recent normal smear following initial abnormal smear: Secondary | ICD-10-CM

## 2011-03-30 NOTE — Group Therapy Note (Signed)
NAMEAGATHA, [name]                 ACCOUNT NO.:  [id number]  MEDICAL RECORD NO.:  [id number]           PATIENT TYPE:  A  LOCATION:  [facility]                   FACILITY:  [facility]  PHYSICIAN:  Kathleen Kaufmann, MD  DATE OF BIRTH:  Feb 14, 1961  DATE OF SERVICE:  03/29/2011                                 CLINIC NOTE  CHIEF COMPLAINT:  Repeat Pap smear.  HISTORY OF PRESENT ILLNESS:  This is a 50 year old who presents for a followup Pap smear after her Pap smear from January had a negative endocervical transformation zone present.  She otherwise denies any acute complaints today.  No vaginal discharge, itching, odor, or any other concerning symptoms.  Her last menstrual period is of March 09, 2011.  PHYSICAL EXAMINATION:  VITAL SIGNS:  Blood pressure 109/75, weight 166.7, height 62 inches, pulse 81, temperature 98.5. GENERAL:  The patient is in no acute distress. GU:  Normal external genitalia.  Normal vaginal mucosa.  Cervix without any visible notable lesions.  Pap smear was performed.  The patient was notable to have some cervical stenosis.  A cervical dilator was used to dilate the cervix and an endocervical component was sent as sample.  The patient tolerated the procedure well.  There was minimal EBL.  ASSESSMENT/PLAN:  This is a 50 year old female with an adequate Pap smear presenting for a repeat Pap smear notable for cervical stenosis, status post cervical dilation.  Plan, again Pap smear was performed.  We will have the patient return to clinic as needed in 1 year depending on the results of her Pap smear.          ______________________________ Kathleen Kaufmann, MD    LC/MEDQ  D:  03/29/2011  T:  03/30/2011  Job:  [id number]

## 2011-04-12 ENCOUNTER — Ambulatory Visit: Payer: Medicaid Other

## 2011-04-22 ENCOUNTER — Telehealth: Payer: Self-pay | Admitting: *Deleted

## 2011-04-22 NOTE — Telephone Encounter (Signed)
Pt called and only has 3 doses of Lovenox left.  I paged Dr Alexandria Lodge and he will see pt tomorrow at 0930. Pt aware and knows to be on time.

## 2011-04-23 ENCOUNTER — Ambulatory Visit (INDEPENDENT_AMBULATORY_CARE_PROVIDER_SITE_OTHER): Payer: Medicaid Other | Admitting: Pharmacist

## 2011-04-23 DIAGNOSIS — I82409 Acute embolism and thrombosis of unspecified deep veins of unspecified lower extremity: Secondary | ICD-10-CM

## 2011-04-23 DIAGNOSIS — Z7901 Long term (current) use of anticoagulants: Secondary | ICD-10-CM

## 2011-04-23 NOTE — Patient Instructions (Signed)
Patient has been provided her final 20 syringes of Lovenox 40mg . She is to inject 1 syringe every 24 hours until this supply is expended. At that time, no more Lovenox will be required as she will have met her requisite treatment duration for her index VTE.

## 2011-04-23 NOTE — Progress Notes (Signed)
Patient has been on SQ Lovenox 40mg  since her initial index VTE for continued prophylaxis against recurrence of VTE. She will completed 20 more syringes and she will then be finished for her treatment course. Patient is understanding of this and was advised if symptoms of VTE were to recur, she should come to [facility] or go to the ED.

## 2011-04-27 NOTE — Group Therapy Note (Signed)
Kathleen [name], Kathleen [name]                 ACCOUNT NO.:  [id number]   MEDICAL RECORD NO.:  [id number]          PATIENT TYPE:  WOC   LOCATION:  [facility]                   FACILITY:  [facility]   PHYSICIAN:  Ginger Carne, MD DATE OF BIRTH:  January 08, 1961   DATE OF SERVICE:  07/19/2008                                  CLINIC NOTE   CHIEF COMPLAINT:  Followup of results from her D&C and hysteroscopy of  June 18, 2008.  The results of the pathology from June 18, 2008 were  reviewed with the patient.  These results showed benign endometrium and  endocervical mucosa with blood clots and no evidence of atypia.  The  patient was reassured that these were normal results.  She is currently  not experiencing any bleeding since her hysteroscopy and D&C.   PHYSICAL EXAMINATION:  VITAL SIGNS:  Temperature 98.1, pulse 101, blood  pressure 111/86, weight 187.7 pounds, height 5 feet 2 inches, and  respirations 16.  GENERAL:  Alert and oriented, in no acute distress.  LUNGS:  Clear to auscultation bilateral.  HEART:  Regular rate and rhythm with no murmurs, rubs, or gallops.  ABDOMEN:  Has positive normoactive bowel sounds with no tenderness or  masses.  LEGS:  No edema or tenderness.   ASSESSMENT AND PLAN:  This is a 50 year old female who had been  evaluated for abnormal uterine bleeding.  She was advised that her  dilation and curettage, pathology were completely normal and she  currently has resolution of her bleeding.  She is instructed to continue  to follow up of her chronic medical problems with the [facility].  She has an appointment there on July 23, 2008.           ______________________________  Ginger Carne, MD     SHB/MEDQ  D:  07/19/2008  T:  07/20/2008  Job:  [id number]

## 2011-04-27 NOTE — Procedures (Signed)
EEG NUMBER:  [id number].   ORDERED BY:  I do not know, not listed.  Looks like teaching service,  Dr. Madaline Guthrie.   This is a 50 year old woman who was admitted for asthma, visual  disturbances with cough, shortness of breath, intermittent blacking out  of vision, history of polysubstance abuse, and history of temporal  headaches.   The patient's medications include Protonix, Ventolin, Tessalon,  Klonopin, Nicoderm, Symbicort, Ambien, prednisone, Phenergan, Tylenol,  Vicodin.   This was a routine 17-channel EEG with 1 channel devoted to EKG  utilizing [employer] 10/20 lead placement system.  The patient was  described clinically as being awake and asleep.  Electrographically, she  also appeared to be awake and asleep.  While awake, the background  consisted of well-organized, well-developed, well modulated, 10 Hz alpha  activity which is predominant in the posterior head regions and reactive  to eye opening.  During drowsiness and sleep, there was attenuation in  the background with decreased frequency and amplitude with some K  complexes seen.  The patient did not enter deeper sleep with delta  activity seen, and no significant sleep spindles were noted.  No  interhemispheric asymmetry was identified, and no definite epileptiform  discharges were seen.  Hyperventilation was not performed.  Photic  stimulation did not produce any significant change in background  activity.  The EKG monitor reveals relatively regular rhythm with a rate  of 78 beats per minute.   CONCLUSION:  Normal awake and asleep EEG without seizure activity or  focal abnormality seen during the course of today's recording.  Clinical  correlation is recommended.      Catherine A. Orlin Hilding, M.D.  Electronically Signed     ZOX:WRUE  D:  03/13/2008 17:11:52  T:  03/14/2008 07:56:18  Job #:  [id number]

## 2011-04-27 NOTE — Discharge Summary (Signed)
Kathleen [name], Kathleen [name]                 ACCOUNT NO.:  [id number]   MEDICAL RECORD NO.:  [id number]          PATIENT TYPE:  INP   LOCATION:  5118                         FACILITY:  [facility]   PHYSICIAN:  Manning Charity, MD     DATE OF BIRTH:  11/09/1960   DATE OF ADMISSION:  03/07/2008  DATE OF DISCHARGE:  03/14/2008                               DISCHARGE SUMMARY   DISCHARGE DIAGNOSES:  1. Asthma.  2. Chronic pain syndrome.  3. Anxiety disorder.  4. Chronic shortness of breath and cough.  5. Hypokalemia.  6. Polysubstance abuse with recent cocaine, narcotics, benzodiazepine,      and marijuana use.  She denies alcohol use  7. Nausea, vomiting, diarrhea, and chronic abdominal pain.  8. Vision changes.  Reportedly vision loss by the patient with a      negative preliminary medical workup.  9. Atypical migraine.   DISCHARGE MEDICATIONS:  1. Albuterol 2.5 INH q.6 h. p.r.n. for wheezing.  2. Symbicort 1 puff b.i.d.  3. Cipro 250 mg p.o. b.i.d. for 3 days.  4. Protonix 40 mg p.o. daily for 2 weeks then stop.  5. Prednisone taper 6 tablets, 5, 4, 3, 2, 1 then stop.  6. Topamax 25 mg p.o. for 1 week then increase to 50 mg p.o. daily.  7. Spiriva 1 puff inhalation daily.   DISPOSITION AND FOLLOWUP:  [name] has a followup appoint with Dr.  [name] in the Outpatient Clinic on March 18, 2008, at 10:38 a.m.  At the time of followup, Ms. Kroeger will need her pain management regimen  adjusted as well as her followup on her mental status after starting new  medications including Topamax.  She will need followup on her dyspnea  and shortness of breath.  Telephone number and  information for [facility] was provided.  She has been given the information on  sponsor and drug rehab program.  I had a long discussion with Ms. Sforza  about not continuing to use street drugs or any other illegal drugs, if  she was interested in getting her pain controlled and also feeling  better in terms  of her chronic pain.  She has agreed to have regular  urine drug screens in our clinic and hopes to be able to begin pain  management program.   PROCEDURES:  1. Noninvasive vascular lab carotid duplex study done on March 08, 2008, negative for any ICA stenosis.  2. Pulmonary function tests:  The patient had difficulty performing      the test as instructed.  She was unable to maintain a frequency of      60 breaths per minute during lung volume measurements.  She had      effort-related abnormalities in her PFTs; therefore, they were not      able to be used to contribute to a useful diagnosis of shortness of      breath, likely psychogenic component.   HOSPITAL COURSE:  1. Chronic pain syndrome:  [name] has a chronic pain syndrome as  well as polysubstance abuse, making it very difficult to identify      and treat her pain.  Her pain is mostly located in her low back,      neck, and also she reports having chronic daily incapacitating      headaches.  She says these headaches are intermittent and that they      often are associated with hot flashes, chills, and changes in her      vision.  She thinks that she has blackouts.  She cannot sort this      out from times when she is under the influence of illicit drugs.      She also says she has chronic dental pain from multiple dental      abscesses.  In addition to this she has, what she says is,      persistent shortness of breath which has gotten worse over the past      few days and it is actually what initiated her coming to the      emergency department.  When she arrived at the emergency      department, her oxygen saturations were normal.  She had a mildly      elevated temperature at 100.3 and a cough with some minimal nasal      and upper respiratory congestion.  She was treated empirically with      a course of Avelox for possible upper and lower respiratory      infection pathogens.  2. Migraines:  The patient  had some very unusual complaints including      vision loss, tactile changes, sensory changes on her face as well      as some paresthesias in her extremities bilaterally.  None of her      complaints fit a specific neurologic pattern; however, she was      placed on Topamax 25 mg p.o. daily.  This needs to be followed up      in the Outpatient Clinic as well as she needs to be followed up in      terms of her drug rehab program.  It is difficult to sort out as      she has an actual physiologic problem versus more of a psychiatric      diagnosis.  3. Urinary tract infection:  The patient had urine micro with white      blood cells and final culture of growth is pending at the time of      this dictation.  She did receive 3 days of p.o. Cipro.   Discharge vital signs, blood pressure 106/70, pulse 95, respiratory rate  20, and temperature 98.2.   Sodium 137, potassium 4.0, chloride 101, bicarb 30, BUN 10, creatinine  0.82, and glucose 125.      Edsel Petrin, D.O.  Electronically Signed      Manning Charity, MD  Electronically Signed    ELG/MEDQ  D:  05/15/2008  T:  05/15/2008  Job:  [id number]

## 2011-04-27 NOTE — Procedures (Signed)
NAMESREENIDHI, Kathleen [name]                 ACCOUNT NO.:  [id number]   MEDICAL RECORD NO.:  [id number]          PATIENT TYPE:  OUT   LOCATION:  S[facility] CENTER                 FACILITY:  [facility]   PHYSICIAN:  Clinton D. Maple Hudson, MD, FCCP, FACPDATE OF BIRTH:  [birth year]-26-1960   DATE OF STUDY:  10/30/2008                            NOCTURNAL POLYSOMNOGRAM   REFERRING PHYSICIAN:  [name], MD   REFERRING PHYSICIAN:  [name], MD   INDICATION FOR STUDY:  Hypersomnia with sleep apnea.   EPWORTH SLEEPINESS SCORE:  Epworth sleepiness score 10/24.  BMI 31.5.  Weight 172 pounds.  Height 62 inches.  Neck 13.5 inches.   MEDICATIONS:  Home medications are charted and reviewed.   SLEEP ARCHITECTURE:  Total sleep time 357 minutes with sleep efficiency  90.7%.  Stage I was 15.7%.  Stage II 82.8%.  Stage III absent.  REM 1.5%  of total sleep time.  Sleep latency 4.5 minutes.  REM latency 301  minutes.  Awake after sleep onset 32 minutes.  Arousal index 10.2.  Bedtime medication included Propoxyphene-APAP, naproxen, omeprazole,  Seroquel XR, divalproex, and Ambien CR.   RESPIRATORY DATA:  Apnea-hypopnea index (AHI) 0.2 per hour.  A single  hypopnea was recorded.  There were insufficient events to permit CPAP  titration by split protocol on the study night.   OXYGEN DATA:  Moderate-to-loud snoring with oxygen desaturation to a  nadir of 94%.  Mean oxygen saturation through the study was 96.9% on  room air.   CARDIAC DATA:  Normal sinus rhythm.   MOVEMENT/PARASOMNIA:  No significant movement disturbance.  Bathroom x1.   IMPRESSION/RECOMMENDATION:  1. Significant numbers of medications likely to affect sleepiness are      noted on this patient's medicine list and several were taken at      bedtime.  2. Sleep architecture was significant for reduced percentage of rapid      eye movement, sometimes associated with depression and often      associated with use of antidepressant medications.  3.  Insignificant respiratory sleep disturbance, apnea-hypopnea index      0.2 per hour (normal range 0-5 per hour).  Moderate-to-loud snoring      with oxygen desaturation to a nadir of 94%.      Clinton D. Maple Hudson, MD, FCCP, FACP  Diplomate, Biomedical engineer of Sleep Medicine  Electronically Signed     CDY/MEDQ  D:  11/02/2008 14:56:01  T:  11/03/2008 01:28:09  Job:  [id number]

## 2011-04-27 NOTE — Op Note (Signed)
[name], Kathleen [name]                 ACCOUNT NO.:  [id number]   MEDICAL RECORD NO.:  [id number]          PATIENT TYPE:  AMB   LOCATION:  [facility]                           [facility]AC[facility]:  [facility]   PHYSICIAN:  Lesly Dukes, M.D. DATE OF BIRTH:  1961/03/28   DATE OF PROCEDURE:  06/18/2008  DATE OF DISCHARGE:                               OPERATIVE REPORT   PREOPERATIVE DIAGNOSIS:  A 50 year old female with abnormal uterine  bleeding.   POSTOPERATIVE DIAGNOSIS:  A 50 year old female with abnormal uterine  bleeding.   PROCEDURES:  Dilation and curettage hysteroscopy.   SURGEON:  Lesly Dukes, MD.   ANESTHESIA:  MAC and local.   FINDINGS:  Slightly enlarged uterus, anteverted.   SPECIMENS:  Endometrial curettings to Pathology.   ESTIMATED BLOOD LOSS:  Minimal.   COMPLICATIONS:  None.   DESCRIPTION OF PROCEDURE:  After informed consent was obtained, the  patient was taken to the operating room where MAC anesthesia was  induced.  The patient was placed in dorsal lithotomy position, and  prepared and draped in normal sterile fashion.  The bladder was emptied.  A bivalve speculum was placed into the vagina.  The anterior lip of the  cervix was grasped with single-tooth tenaculum.  Cervical os was gently  dilated to a #7 Hegar dilator.  The external os was pinpoint and had to  be gently dilated open and this probably accounts for selective  transformation zone on the Pap smear.  Once the os was dilated, the  hysteroscope was gently introduced into the uterus and there was  possibly a small polyp on the right uterine sidewall.  A sharp curettage  was performed and the specimen was sent to Pathology.  All instruments  were removed from the patient's vagina and there was good hemostasis at  the end of the procedure.  The patient tolerated the procedure well.  Sponge, lap, instrument, and needle counts were correct x2.  The patient  was taken to the recovery room in stable  condition.      Lesly Dukes, M.D.  Electronically Signed     KHL/MEDQ  D:  06/18/2008  T:  06/19/2008  Job:  [id number]

## 2011-04-27 NOTE — Group Therapy Note (Signed)
Kathleen [name], WITMAN                 ACCOUNT NO.:  [id number]   MEDICAL RECORD NO.:  [id number]          PATIENT TYPE:  WOC   LOCATION:  [facility]                   FACILITY:  [facility]   PHYSICIAN:  Elsie Lincoln, MD      DATE OF BIRTH:  [birth year]   DATE OF SERVICE:  05/08/2008                                  CLINIC NOTE   Patient is here for routine well woman exam with Pap smear.   MENSTRUAL HISTORY:  Patient has her cycle twice a month.  This is new x2  years with her last menstrual cycle being on April 08, 2008.  It is  light flow.  Some pain with intercourse.  No bleeding in between cycles.   Contraceptive method used is the withdrawal method x16 years.   OBSTETRICAL HISTORY:  Five pregnancies with two living children, two  terminations of pregnancy, and one ectopic.   GYNECOLOGIC HISTORY:  Patient cannot remember last Pap smear.  Denies  any history of abnormal Pap smear.  Last mammogram was April 09, 2008,  which was normal.   SURGICAL HISTORY:  Negative.   FAMILY HISTORY:  Father with diabetes.  Father with heart disease and  heart attacks, high blood pressure, cancer, and blood clots.  Mother  with high blood pressure as well.   PAST MEDICAL HISTORY:  Patient is bipolar.  Asthma.  Had pneumonia.  Was  hospitalized from March - April, 2009.  Diagnosed with high cholesterol,  hypertension in 2009.   SOCIAL HISTORY:  Patient smokes a half pack per day x20 years.  Drinks  approximately once a day.  Patient used crack for 24 years.  Has been  clean since February 27, 2008.  Denies history of sexual difficulties or  current abuse at this time.   REVIEW OF SYSTEMS:  Patient is experiencing numbness and weakness in  fingers, swelling in legs, and a multitude of symptoms that patient is  receiving followup at [facility], and her next  appointment is May 14, 2008.   OBJECTIVE:  VITAL SIGNS:  Temperature 98.6, pulse 83, blood pressure  101/69, respirations  18.  Patient is alert and oriented x3.  No signs of acute distress.  ABDOMEN:  Patient is soft, nontender.  PELVIC:  Vagina:  No abnormal lesions.  No abnormal discharge.  Vagina  rugated.  Cervix:  Patent os.  No abnormal lesions.  Negative cervical  motion tenderness.  Bimanual exam:  Uterus size is approximately 14-16  weeks.   The prior ultrasound of March 27, 2008, the impression is heterogenous  uterine echotexture, compatible with mild fibroids.  No distinct fibroid  mass.  Unremarkable adnexa.  The endometrial thickness was 5.6 mm.   ASSESSMENT:  1. Pelvic pain.  2. Irregular bleeding.   PLAN:  Pap sent to lab.  Chlamydia and gonorrhea done.  Patient was  advised that an endometrial biopsy is indicated.  Patient declines after  expressing the risks of not receiving one, including potential cancer,  lack of treatment, and ultimate death.  Patient verbalized understanding  of information and will reschedule for a biopsy  with Dr. Penne Lash.  Dr.  Penne Lash consulted with the patient and did a surgical evaluation and  will follow up with patient.  Patient is to return for any complications  and on scheduled OR date.           ______________________________  Elsie Lincoln, MD     KL/MEDQ  D:  05/08/2008  T:  05/08/2008  Job:  [id number]

## 2011-07-03 ENCOUNTER — Encounter: Payer: Self-pay | Admitting: Internal Medicine

## 2011-09-06 LAB — URINALYSIS, ROUTINE W REFLEX MICROSCOPIC
Bilirubin Urine: NEGATIVE
Ketones, ur: NEGATIVE
Ketones, ur: NEGATIVE
Nitrite: NEGATIVE
Nitrite: NEGATIVE
Protein, ur: NEGATIVE
Urobilinogen, UA: 0.2

## 2011-09-06 LAB — CBC
HCT: 37.8
HCT: 35.5 — ABNORMAL LOW
HCT: 37.5
Hemoglobin: 12.6
MCV: 82.6
MCV: 84.4
MCV: 84.5
Platelets: 279
Platelets: 288
RDW: 13.6
RDW: 13.5
RDW: 13.7

## 2011-09-06 LAB — BASIC METABOLIC PANEL
BUN: 8
BUN: 9
CO2: 30
Calcium: 9.3
Chloride: 104
Chloride: 101
Chloride: 103
Chloride: 107
GFR calc Af Amer: >60
GFR calc non Af Amer: 56 — ABNORMAL LOW
GFR calc non Af Amer: >60
GFR calc non Af Amer: >60
Glucose, Bld: 112 — ABNORMAL HIGH
Glucose, Bld: 121 — ABNORMAL HIGH
Glucose, Bld: 99
Potassium: 3 — ABNORMAL LOW
Potassium: 3.9
Potassium: 4.3
Sodium: 137
Sodium: 138

## 2011-09-06 LAB — BLOOD GAS, ARTERIAL
Bicarbonate: 24
FIO2: 0.21
O2 Saturation: 93.2
Patient temperature: 98.6

## 2011-09-06 LAB — DIFFERENTIAL
Basophils Absolute: 0
Basophils Relative: 1
Eosinophils Absolute: 0.2
Eosinophils Relative: 4
Monocytes Relative: 5

## 2011-09-06 LAB — RAPID URINE DRUG SCREEN, HOSP PERFORMED
Barbiturates: NOT DETECTED
Barbiturates: NOT DETECTED
Cocaine: POSITIVE — AB
Opiates: POSITIVE — AB
Tetrahydrocannabinol: NOT DETECTED
Tetrahydrocannabinol: NOT DETECTED

## 2011-09-06 LAB — CK TOTAL AND CKMB (NOT AT ARMC)
CK, MB: 1.2
Total CK: 81

## 2011-09-06 LAB — URINE CULTURE: Colony Count: 80000

## 2011-09-06 LAB — D-DIMER, QUANTITATIVE: D-Dimer, Quant: 0.35

## 2011-09-06 LAB — HEPATIC FUNCTION PANEL
ALT: 23
AST: 42 — ABNORMAL HIGH
Albumin: 3.6
Alkaline Phosphatase: 56
Total Bilirubin: 0.3

## 2011-09-07 LAB — BASIC METABOLIC PANEL
BUN: 11
Chloride: 101
Potassium: 3.6

## 2011-09-08 LAB — POCT PREGNANCY, URINE: Preg Test, Ur: NEGATIVE

## 2011-09-09 LAB — BENZODIAZEPINE, QUANTITATIVE, URINE
Alprazolam (GC/LC/MS), ur confirm: NEGATIVE
Flurazepam GC/MS Conf: NEGATIVE
Nordiazepam GC/MS Conf: NEGATIVE

## 2011-09-09 LAB — CBC
HCT: 38.9
Hemoglobin: 13.1
RBC: 4.58

## 2011-09-09 LAB — URINE DRUGS OF ABUSE SCREEN W ALC, ROUTINE (REF LAB)
Barbiturate Quant, Ur: NEGATIVE
Benzodiazepines.: POSITIVE — AB
Ethyl Alcohol: <10
Methadone: NEGATIVE
Opiate Screen, Urine: NEGATIVE
Phencyclidine (PCP): NEGATIVE
Propoxyphene: NEGATIVE

## 2011-10-27 ENCOUNTER — Other Ambulatory Visit: Payer: Self-pay | Admitting: Internal Medicine

## 2011-10-27 DIAGNOSIS — Z1231 Encounter for screening mammogram for malignant neoplasm of breast: Secondary | ICD-10-CM

## 2011-12-02 ENCOUNTER — Other Ambulatory Visit: Payer: Self-pay | Admitting: Internal Medicine

## 2011-12-02 ENCOUNTER — Ambulatory Visit
Admission: RE | Admit: 2011-12-02 | Discharge: 2011-12-02 | Disposition: A | Discharge status: Home / Self Care | Payer: Medicaid Other | Source: Ambulatory Visit | Attending: Internal Medicine | Admitting: Internal Medicine

## 2011-12-02 DIAGNOSIS — N63 Unspecified lump in unspecified breast: Secondary | ICD-10-CM

## 2011-12-02 DIAGNOSIS — Z1231 Encounter for screening mammogram for malignant neoplasm of breast: Secondary | ICD-10-CM

## 2012-01-19 ENCOUNTER — Ambulatory Visit (INDEPENDENT_AMBULATORY_CARE_PROVIDER_SITE_OTHER): Payer: Medicaid Other | Admitting: Internal Medicine

## 2012-01-19 ENCOUNTER — Encounter: Payer: Self-pay | Admitting: Internal Medicine

## 2012-01-19 VITALS — BP 123/83 | HR 77 | Temp 97.2°F | Ht 62.0 in | Wt 174.6 lb

## 2012-01-19 DIAGNOSIS — F319 Bipolar disorder, unspecified: Secondary | ICD-10-CM

## 2012-01-19 DIAGNOSIS — J45909 Unspecified asthma, uncomplicated: Secondary | ICD-10-CM

## 2012-01-19 DIAGNOSIS — M542 Cervicalgia: Secondary | ICD-10-CM

## 2012-01-19 MED ORDER — ALBUTEROL SULFATE (2.5 MG/3ML) 0.083% IN NEBU: 2.5000 mg | INHALATION_SOLUTION | Freq: Four times a day (QID) | RESPIRATORY_TRACT | Status: DC | PRN

## 2012-01-19 MED ORDER — FLUTICASONE-SALMETEROL 250-50 MCG/DOSE IN AEPB: 1.0000 | INHALATION_SPRAY | Freq: Two times a day (BID) | RESPIRATORY_TRACT | Status: DC

## 2012-01-19 MED ORDER — MELOXICAM 7.5 MG PO TABS: 7.5000 mg | ORAL_TABLET | Freq: Two times a day (BID) | ORAL | Status: AC | PRN

## 2012-01-19 NOTE — Assessment & Plan Note (Addendum)
Etiology of the patient's neck pain is unclear at this time. With her reported numbness, tingling, and left extremity weakness (confirmed on physical exam), I am concerned at this time for possible cervical disease. I do not appreciate any masses or lymphadenopathy as was reported by the patient.  Check MRI cervical spine without contrast.  Will try meloxicam for anti-inflammatory effect and pain control

## 2012-01-19 NOTE — Patient Instructions (Signed)
   Please follow-up at the clinic in 3 weeks, at which time we will reevaluate your neck pain, asthma.  There have been changes in your medications  START meloxicam twice daily as needed - DO NOT take with ibuprofen, advil, naproxen, etc.  START advair daily  START albuterol                                                                Please get your MRI, I will call you if results are abnormal.  Please follow up with [facility] for your bipolar disorder.  Please follow-up in the clinic sooner if needed.  If you have been started on new medication(s), and you develop symptoms concerning for allergic reaction, including, but not limited to, throat closing, tongue swelling, rash, please stop the medication immediately and call the clinic at [phone], and go to the ER.  If symptoms worsen, or new symptoms arise, please call the clinic or go to the ER.  Please bring all of your medications in a bag to your next visit.

## 2012-01-19 NOTE — Assessment & Plan Note (Signed)
>>  ASSESSMENT AND PLAN FOR ASTHMA EXACERBATION WRITTEN ON 01/19/2012  4:34 PM BY KALIA-REYNOLDS, MAITRI S, DO  Patient's asthma is not controlled currently. She continues to smoke and continues to use her mother's nebulizer machine 3 times daily at least - however, does not think she needs to be on medication. Previously, albuterol inhaler, even with the use of a spacer has been ineffective for her. She previously also felt that Advair was ineffective, however seems to have understanding that it is to be a rescue inhaler.  Encourage smoking cessation  Refill of albuterol nebulizer given.  Start Advair Diskus given.

## 2012-01-19 NOTE — Progress Notes (Signed)
Subjective:   Patient ID: Kathleen [name] female   DOB: 1961-07-19 51 y.o.   MRN: [id number]  HPI: Kathleen [name] is a 51 y.o. with a PMHx of bipolar disorder, asthma, GERD, remote history of crack cocaine use (quit in 2009) who presented to clinic today for the following:  1) Neck pain - left sided neck dull, constant pain for last 1 month, very tender to the touch. Feels occasional numbness into her arm. Has some tingling in her arm. Collectively, she has experienced increased this in her left upper extremity as compared to the right, occasionally noting that she has difficulty with gripping secondary to the numbness and tingling.  She feels that there is a lump present on the lateral aspect of her neck, and says that she occasionally has difficulty swallowing liquids or solids secondary to the floor. States she fell at the end of January 20, but pain preceded the fall. She previously has not experienced similar symptoms, and denies history of thyroid goiter. No fevers, chills, nausea, vomiting, diarrhea. Denies history of thyroid problems.   2) Bipolar disorder - previously seen at [facility] center, has been off of all medications for 1-2 years per her own decision. Occasional anxiety, hyperventilation. Has a lot of depressive symptoms, crying almost daily. No suicidal or homicidal ideations.   3) Asthma - has been off of all medication for greater than 1 year because she did not continue medications anymore. Confusingly to this logic, she is using her mom's albuterol nebulizer machines - using 3 times daily, secondary to subjective shortness of breath. Otherwise, at this time, she denies chest pain, palpitations, shortness of breath, cough, fevers, chills.   Review of Systems: Per HPI.  Current Outpatient Medications: Has been off since 1-2 years. Previously (until 12/2010) was on the following: Medication Sig  . albuterol (PROVENTIL HFA) 108 (90 BASE) MCG/ACT inhaler Inhale 1 puff into the  lungs every 6 (six) hours as needed.    . budesonide-formoterol (SYMBICORT) 160-4.5 MCG/ACT inhaler Inhale 2 puffs into the lungs 2 (two) times daily.    . cyclobenzaprine (FLEXERIL) 5 MG tablet Take 5 mg by mouth. at bedtime.   . diclofenac sodium (VOLTAREN) 1 % GEL Apply topically. Apply to the affected area three times a day as needed for pain.   . divalproex (DEPAKOTE) 500 MG 24 hr tablet Take 2 tablets by mouth at bedtime.   Marland Kitchen HYDROcodone-acetaminophen (VICODIN) 5-500 MG per tablet Take 1 tablet by mouth every 6 (six) hours as needed.    . naproxen (NAPROSYN) 500 MG tablet Take 500 mg by mouth 2 (two) times daily with a meal.    . Omeprazole 20 MG TBEC Take 1 tablet by mouth once.    Marland Kitchen QUEtiapine (SEROQUEL XR) 400 MG 24 hr tablet 400 mg. Take 2 tablets by mouth at 6: 00 PM.   . sertraline (ZOLOFT) 100 MG tablet 100 mg. Take one and a half tablet by mouth every morning.   . tiotropium (SPIRIVA HANDIHALER) 18 MCG inhalation capsule Place 18 mcg into inhaler and inhale daily.    Marland Kitchen warfarin (COUMADIN) 5 MG tabl et - was on for a  5 mg. Take as directed.   . zolpidem (AMBIEN) 5 MG tablet Take 5 mg by mouth at bedtime as needed.      Allergies  Allergen Reactions  . Topiramate     REACTION: hallucinations.    Past Medical History  Diagnosis Date  . GERD (gastroesophageal reflux disease)   .  Asthma     2 sets of PFT's in 04/09 without sign variability. Last set with significant decrease in FEV1 with saline alone, suggesting Asthma but  recommended clinical corelation   . Polysubstance abuse     none since March 17,2009.  Marland Kitchen Lower extremity edema     Neg ABI's, normal 2D echo, normal albumin  . Chronic headache   . Chronic pain     normal work up including TSH, RPR, B12, HIV, plain films, 2 ESR's, ANA, CK, RF along with routine CBC, CMET and UA. Further work up includes CRP, ESR, SPEP/UPEP, hepatitis erology, A1C , repeat ANA  . Bipolar 1 disorder     therapist is Ander Slade and is followed by  [facility]  . Blackout     negative work up including ESR, ANA, opthalmology referral, carotid dopplers, 2D echo, MRI and EEG.  . DUB (dysfunctional uterine bleeding)     and pelvic pain, with negative endometrial bx in 07/09 and transvaginal U/S significant for mild fibroids in 0/09.  . Breast mass in female     s/p mammogram, u/s and biopsy in 05/09 c/w fibroadenoma,    Past Surgical History  Procedure Date  . Cesarean section   . Hernia repair   . Left partial oophorectomy      Objective:   Physical Exam: Filed Vitals:   01/19/12 1115  BP: 123/83  Pulse: 77  Temp: 97.2 F (36.2 C)     General: Vital signs reviewed and noted. Well-developed, well-nourished, in no acute distress; alert, appropriate and cooperative throughout examination.  Head: Normocephalic, atraumatic.  Eyes: conjunctivae/corneas clear. PERRL, EOM's intact.  Ears: TM nonerythematous, not bulging, good light reflex bilaterally.  Nose: Mucous membranes moist, not inflammed, nonerythematous.  Throat: Oropharynx nonerythematous, no exudate appreciated.   Neck: No deformities, or masses appreciated. No lymphadenopathy appreciated in the anterior or posterior cervical, maxillary, or submandibular chains. Significant tenderness to palpation over left lateral neck musculature, particularly over SCM without obvious deformity, warmth, erythema, swelling of region. Full ROM.   Lungs:  Normal respiratory effort. Clear to auscultation BL without crackles or wheezes.  Heart: RRR. S1 and S2 normal without gallop, murmur, or rubs.  Abdomen:  BS normoactive. Soft, Nondistended, non-tender.  No masses or organomegaly.  Extremities: No pretibial edema.  Neurologic: A&O X3, CN II - XII are grossly intact. Motor strength is 5/5 in the all 4 extremities, However, left grip strength is slightly decreased as compared to the right. Sensations intact to light touch.      Assessment & Plan:  Case and plan of care  discussed with Dr. Ulyess Mort.

## 2012-01-19 NOTE — Assessment & Plan Note (Signed)
Issues instructed that she needs to followup with [facility] for continued mental health services. She reports several intolerances to the previously prescribed medications of Zoloft and Seroquel, therefore, I feel that expert opinion will be helpful.  Patient given contact information for [facility] and is recommended to follow-up with them as soon as possible so that mental health medications can be readded.  Patient instructed to call the clinic or go to the ER if suicidal or homicidal symptoms arise.

## 2012-01-19 NOTE — Assessment & Plan Note (Signed)
Patient's asthma is not controlled currently. She continues to smoke and continues to use her mother's nebulizer machine 3 times daily at least - however, does not think she needs to be on medication. Previously, albuterol inhaler, even with the use of a spacer has been ineffective for her. She previously also felt that Advair was ineffective, however seems to have understanding that it is to be a rescue inhaler.  Encourage smoking cessation  Refill of albuterol nebulizer given.  Start Advair Diskus given.

## 2012-01-26 ENCOUNTER — Telehealth: Payer: Self-pay | Admitting: *Deleted

## 2012-01-26 ENCOUNTER — Other Ambulatory Visit: Payer: Self-pay | Admitting: Internal Medicine

## 2012-01-26 DIAGNOSIS — Z1231 Encounter for screening mammogram for malignant neoplasm of breast: Secondary | ICD-10-CM

## 2012-01-26 NOTE — Telephone Encounter (Signed)
CALLED [facility] AND GAVE HER THE MRI APPT.  FEB.18.013 @ 12:00AM TO ARRIVE 11:45AM. PATIENT IS CHECK IN AT [facility]. PATIENT IS AWARE OFTHIS.  LELA STRUDIVANT NTII 2-13-013 3:14PM

## 2012-01-31 ENCOUNTER — Ambulatory Visit (HOSPITAL_COMMUNITY)
Admission: RE | Admit: 2012-01-31 | Discharge: 2012-01-31 | Disposition: A | Discharge status: Home / Self Care | Payer: Medicaid Other | Source: Ambulatory Visit | Attending: Internal Medicine | Admitting: Internal Medicine

## 2012-01-31 ENCOUNTER — Other Ambulatory Visit: Payer: Self-pay | Admitting: Internal Medicine

## 2012-01-31 DIAGNOSIS — M5090 Cervical disc disorder, unspecified, unspecified cervical region: Secondary | ICD-10-CM | POA: Insufficient documentation

## 2012-01-31 DIAGNOSIS — M5030 Other cervical disc degeneration, unspecified cervical region: Secondary | ICD-10-CM | POA: Insufficient documentation

## 2012-01-31 DIAGNOSIS — M4802 Spinal stenosis, cervical region: Secondary | ICD-10-CM

## 2012-01-31 DIAGNOSIS — M542 Cervicalgia: Secondary | ICD-10-CM

## 2012-01-31 DIAGNOSIS — M47812 Spondylosis without myelopathy or radiculopathy, cervical region: Secondary | ICD-10-CM | POA: Insufficient documentation

## 2012-01-31 DIAGNOSIS — J320 Chronic maxillary sinusitis: Secondary | ICD-10-CM | POA: Insufficient documentation

## 2012-01-31 DIAGNOSIS — M5412 Radiculopathy, cervical region: Secondary | ICD-10-CM | POA: Insufficient documentation

## 2012-01-31 NOTE — Progress Notes (Signed)
MRI cervical spine shows disc bulge and impingement. Therefore, referral to neurosurgeon will be placed. This result and plan of care was discussed with Dr. Aundria Rud, and I called and explained the results/ plan of care with Ms. Fife and her husband. They understand and agree with plan of care.  Johnette Abraham, D.O.

## 2012-02-07 ENCOUNTER — Other Ambulatory Visit: Payer: Self-pay | Admitting: Internal Medicine

## 2012-02-07 ENCOUNTER — Ambulatory Visit
Admission: RE | Admit: 2012-02-07 | Discharge: 2012-02-07 | Disposition: A | Discharge status: Home / Self Care | Payer: Medicaid Other | Source: Ambulatory Visit | Attending: Internal Medicine | Admitting: Internal Medicine

## 2012-02-07 DIAGNOSIS — Z1231 Encounter for screening mammogram for malignant neoplasm of breast: Secondary | ICD-10-CM

## 2012-02-09 ENCOUNTER — Ambulatory Visit (INDEPENDENT_AMBULATORY_CARE_PROVIDER_SITE_OTHER): Payer: Medicaid Other | Admitting: Internal Medicine

## 2012-02-09 DIAGNOSIS — M549 Dorsalgia, unspecified: Secondary | ICD-10-CM

## 2012-02-10 NOTE — Progress Notes (Signed)
Patient ID: Kathleen [name], female   DOB: [birth year], 52 y.o.   MRN: [id number]   Patient cancelled appointment after arrival because she has follow-up with the specialist already.

## 2012-03-08 ENCOUNTER — Telehealth: Payer: Self-pay | Admitting: Internal Medicine

## 2012-03-16 NOTE — Telephone Encounter (Signed)
Done

## 2012-03-23 ENCOUNTER — Encounter: Payer: Self-pay | Admitting: Internal Medicine

## 2012-06-12 ENCOUNTER — Encounter (INDEPENDENT_AMBULATORY_CARE_PROVIDER_SITE_OTHER): Payer: Self-pay | Admitting: Ophthalmology

## 2012-06-29 ENCOUNTER — Encounter: Payer: Medicaid Other | Admitting: Internal Medicine

## 2012-07-13 ENCOUNTER — Encounter: Payer: Medicaid Other | Admitting: Internal Medicine

## 2013-03-13 ENCOUNTER — Encounter: Payer: Self-pay | Admitting: Internal Medicine

## 2013-03-13 DIAGNOSIS — Z720 Tobacco use: Secondary | ICD-10-CM | POA: Insufficient documentation

## 2013-03-13 DIAGNOSIS — F17200 Nicotine dependence, unspecified, uncomplicated: Secondary | ICD-10-CM | POA: Insufficient documentation

## 2014-04-08 ENCOUNTER — Encounter (HOSPITAL_COMMUNITY): Payer: Self-pay | Admitting: Emergency Medicine

## 2014-04-08 ENCOUNTER — Emergency Department (HOSPITAL_COMMUNITY)
Admission: EM | Admit: 2014-04-08 | Discharge: 2014-04-08 | Disposition: A | Discharge status: Home / Self Care | Payer: No Typology Code available for payment source | Attending: Emergency Medicine | Admitting: Emergency Medicine

## 2014-04-08 ENCOUNTER — Emergency Department (HOSPITAL_COMMUNITY): Payer: No Typology Code available for payment source

## 2014-04-08 DIAGNOSIS — R079 Chest pain, unspecified: Secondary | ICD-10-CM | POA: Insufficient documentation

## 2014-04-08 DIAGNOSIS — J45901 Unspecified asthma with (acute) exacerbation: Secondary | ICD-10-CM | POA: Insufficient documentation

## 2014-04-08 DIAGNOSIS — Z79899 Other long term (current) drug therapy: Secondary | ICD-10-CM | POA: Insufficient documentation

## 2014-04-08 HISTORY — DX: Bronchitis, not specified as acute or chronic: J40

## 2014-04-08 HISTORY — DX: Unspecified asthma, uncomplicated: J45.909

## 2014-04-08 MED ORDER — ALBUTEROL (5 MG/ML) CONTINUOUS INHALATION SOLN
10.0000 mg/h | INHALATION_SOLUTION | Freq: Once | RESPIRATORY_TRACT | Status: AC
  Administered 2014-04-08: 10 mg/h via RESPIRATORY_TRACT
  Filled 2014-04-08: qty 20

## 2014-04-08 MED ORDER — IPRATROPIUM BROMIDE 0.02 % IN SOLN
RESPIRATORY_TRACT | Status: AC
  Administered 2014-04-08: 0.5 mg
  Filled 2014-04-08: qty 2.5

## 2014-04-08 MED ORDER — ALBUTEROL SULFATE HFA 108 (90 BASE) MCG/ACT IN AERS: 2.0000 | INHALATION_SPRAY | RESPIRATORY_TRACT | Status: DC | PRN

## 2014-04-08 MED ORDER — PREDNISONE 20 MG PO TABS
60.0000 mg | ORAL_TABLET | Freq: Once | ORAL | Status: AC
  Administered 2014-04-08: 60 mg via ORAL
  Filled 2014-04-08: qty 3

## 2014-04-08 MED ORDER — ALBUTEROL SULFATE HFA 108 (90 BASE) MCG/ACT IN AERS
2.0000 | INHALATION_SPRAY | Freq: Once | RESPIRATORY_TRACT | Status: AC
  Administered 2014-04-08: 2 via RESPIRATORY_TRACT

## 2014-04-08 MED ORDER — PREDNISONE 20 MG PO TABS: ORAL_TABLET | ORAL | Status: AC

## 2014-04-08 MED ORDER — ALBUTEROL SULFATE (2.5 MG/3ML) 0.083% IN NEBU: 2.5000 mg | INHALATION_SOLUTION | RESPIRATORY_TRACT | Status: DC | PRN

## 2014-04-08 MED ORDER — ALBUTEROL (5 MG/ML) CONTINUOUS INHALATION SOLN
INHALATION_SOLUTION | RESPIRATORY_TRACT | Status: AC
  Filled 2014-04-08: qty 20

## 2014-04-08 NOTE — ED Notes (Signed)
Called to pt room, pt was getting up to bathroom and spilled her nebs. Respiratory therapist called and is at bedside.

## 2014-04-08 NOTE — ED Notes (Signed)
Bed: WA20 Expected date:  Expected time:  Means of arrival:  Comments: 

## 2014-04-08 NOTE — ED Notes (Signed)
Pt. Walked well around the emergency room her SPO2 was 95% the entire walk.

## 2014-04-08 NOTE — ED Notes (Signed)
Per pt, states asthma attack since Fri-does not have inhaler or PCP

## 2014-04-08 NOTE — ED Provider Notes (Signed)
CSN: [id number]     Arrival date & time 04/08/14  3151 History   First MD Initiated Contact with Patient 04/08/14 [phone]     Chief Complaint  Patient presents with  . Asthma     (Consider location/radiation/quality/duration/timing/severity/associated sxs/prior Treatment) HPI 53 year old female presents with 4 days of trouble breathing. She states she's been on a productive cough with white, yellow, green sputum. Has not had any fevers. She's been having chest pain as well. She states she has a history of asthma. She smokes but has not smoked in the last week and she is trying to quit. She states she's had a history of asthma for 4 years. She's not follow with her primary care physician. She states her albuterol nebulizer broke.  Past Medical History  Diagnosis Date  . Asthma   . Bronchitis    History reviewed. No pertinent past surgical history. No family history on file. History  Substance Use Topics  . Smoking status: Never Smoker   . Smokeless tobacco: Not on file  . Alcohol Use: No   OB History   Grav Para Term Preterm Abortions TAB SAB Ect Mult Living                 Review of Systems  Constitutional: Negative for fever.  Respiratory: Positive for cough and shortness of breath.   Cardiovascular: Positive for chest pain.  Gastrointestinal: Negative for nausea and abdominal pain.  All other systems reviewed and are negative.     Allergies  Tramadol  Home Medications   Prior to Admission medications   Medication Sig Start Date End Date Taking? Authorizing Provider  albuterol (PROVENTIL HFA;VENTOLIN HFA) 108 (90 BASE) MCG/ACT inhaler Inhale 4 puffs into the lungs every 4 (four) hours as needed for wheezing or shortness of breath.   Yes Historical Provider, MD  albuterol (PROVENTIL) (2.5 MG/3ML) 0.083% nebulizer solution Take 2.5 mg by nebulization every 6 (six) hours as needed for wheezing or shortness of breath.   Yes Historical Provider, MD  ibuprofen (ADVIL,MOTRIN)  200 MG tablet Take 400 mg by mouth every 6 (six) hours as needed for moderate pain.   Yes Historical Provider, MD   BP 108/80  Pulse 61  Temp(Src) 97.6 F (36.4 C) (Oral)  Resp 18  SpO2 98% Physical Exam  Nursing note and vitals reviewed. Constitutional: She is oriented to person, place, and time. She appears well-developed and well-nourished.  HENT:  Head: Normocephalic and atraumatic.  Right Ear: External ear normal.  Left Ear: External ear normal.  Nose: Nose normal.  Eyes: Right eye exhibits no discharge. Left eye exhibits no discharge.  Cardiovascular: Normal rate, regular rhythm and normal heart sounds.   Pulmonary/Chest: Tachypnea noted. She has wheezes (diffuse expiratory wheezes). She exhibits tenderness (diffuse chest wall tenderness).  Abdominal: Soft. She exhibits no distension. There is no tenderness.  Neurological: She is alert and oriented to person, place, and time.  Skin: Skin is warm and dry.    ED Course  Procedures (including critical care time) Labs Review Labs Reviewed - No data to display  Imaging Review Dg Chest 2 View  04/08/2014   CLINICAL DATA:  Asthma, shortness of breath, chest pain  EXAM: CHEST  2 VIEW  COMPARISON:  None.  FINDINGS: The heart size and mediastinal contours are within normal limits. Both lungs are clear. The visualized skeletal structures are unremarkable.  IMPRESSION: No active cardiopulmonary disease.   Electronically Signed   By: Daryll Brod M.D.   On:  04/08/2014 11:53     EKG Interpretation   Date/Time:  Monday April 08 2014 10:02:17 EDT Ventricular Rate:  66 PR Interval:  157 QRS Duration: 86 QT Interval:  433 QTC Calculation: 454 R Axis:   -56 Text Interpretation:  Sinus rhythm Left anterior fascicular block  Borderline low voltage, extremity leads Abnormal R-wave progression, early  transition No old tracing to compare Confirmed by Mosses  ([id number]) on 04/08/2014 10:08:28 AM      MDM   Final diagnoses:   Asthma exacerbation    Patient with "asthma" with bronchospasm and dyspnea. No hypoxia or distress. No PNA. Improved after albuterol and steroids. Given her smoking history and late onset RAD, this is more likely COPD, but she has nonetheless improved. Able to ambulate without distress or hypoxia. Turns out her nebulizer mask is the part that's broken, will replace and refill her albuterol. I have low suspicion for ACS, PE, CHF. Stable for discharge.     Ephraim Hamburger, MD 04/08/14 2118

## 2014-05-13 ENCOUNTER — Encounter (HOSPITAL_COMMUNITY): Payer: Self-pay | Admitting: Emergency Medicine

## 2014-05-13 ENCOUNTER — Inpatient Hospital Stay (HOSPITAL_COMMUNITY)
Admission: EM | Admit: 2014-05-13 | Discharge: 2014-05-16 | DRG: 189 | Disposition: A | Discharge status: Home / Self Care | Payer: No Typology Code available for payment source | Attending: Internal Medicine | Admitting: Internal Medicine

## 2014-05-13 ENCOUNTER — Emergency Department (HOSPITAL_COMMUNITY): Payer: No Typology Code available for payment source

## 2014-05-13 DIAGNOSIS — R928 Other abnormal and inconclusive findings on diagnostic imaging of breast: Secondary | ICD-10-CM | POA: Diagnosis present

## 2014-05-13 DIAGNOSIS — J9600 Acute respiratory failure, unspecified whether with hypoxia or hypercapnia: Principal | ICD-10-CM | POA: Diagnosis present

## 2014-05-13 DIAGNOSIS — J45901 Unspecified asthma with (acute) exacerbation: Secondary | ICD-10-CM

## 2014-05-13 DIAGNOSIS — J4489 Other specified chronic obstructive pulmonary disease: Secondary | ICD-10-CM | POA: Diagnosis present

## 2014-05-13 DIAGNOSIS — R7309 Other abnormal glucose: Secondary | ICD-10-CM | POA: Diagnosis present

## 2014-05-13 DIAGNOSIS — J45909 Unspecified asthma, uncomplicated: Secondary | ICD-10-CM

## 2014-05-13 DIAGNOSIS — Z6828 Body mass index (BMI) 28.0-28.9, adult: Secondary | ICD-10-CM

## 2014-05-13 DIAGNOSIS — I10 Essential (primary) hypertension: Secondary | ICD-10-CM | POA: Diagnosis present

## 2014-05-13 DIAGNOSIS — J449 Chronic obstructive pulmonary disease, unspecified: Secondary | ICD-10-CM | POA: Diagnosis present

## 2014-05-13 DIAGNOSIS — E860 Dehydration: Secondary | ICD-10-CM | POA: Diagnosis present

## 2014-05-13 DIAGNOSIS — E43 Unspecified severe protein-calorie malnutrition: Secondary | ICD-10-CM | POA: Insufficient documentation

## 2014-05-13 DIAGNOSIS — R922 Inconclusive mammogram: Secondary | ICD-10-CM | POA: Diagnosis present

## 2014-05-13 DIAGNOSIS — Z87891 Personal history of nicotine dependence: Secondary | ICD-10-CM

## 2014-05-13 DIAGNOSIS — N179 Acute kidney failure, unspecified: Secondary | ICD-10-CM | POA: Diagnosis present

## 2014-05-13 DIAGNOSIS — N63 Unspecified lump in unspecified breast: Secondary | ICD-10-CM

## 2014-05-13 DIAGNOSIS — R071 Chest pain on breathing: Secondary | ICD-10-CM | POA: Diagnosis present

## 2014-05-13 DIAGNOSIS — Z791 Long term (current) use of non-steroidal anti-inflammatories (NSAID): Secondary | ICD-10-CM

## 2014-05-13 DIAGNOSIS — Z79899 Other long term (current) drug therapy: Secondary | ICD-10-CM

## 2014-05-13 DIAGNOSIS — IMO0002 Reserved for concepts with insufficient information to code with codable children: Secondary | ICD-10-CM

## 2014-05-13 DIAGNOSIS — J441 Chronic obstructive pulmonary disease with (acute) exacerbation: Secondary | ICD-10-CM | POA: Diagnosis present

## 2014-05-13 LAB — BASIC METABOLIC PANEL
BUN: 13 mg/dL (ref 6–23)
CO2: 21 mEq/L (ref 19–32)
Calcium: 9.1 mg/dL (ref 8.4–10.5)
Chloride: 103 mEq/L (ref 96–112)
Creatinine, Ser: 1.24 mg/dL — ABNORMAL HIGH (ref 0.50–1.10)
GFR, EST AFRICAN AMERICAN: 56 mL/min — AB (ref >90)
GFR, EST NON AFRICAN AMERICAN: 49 mL/min — AB (ref >90)
GLUCOSE: 101 mg/dL — AB (ref 70–99)
Potassium: 4 mEq/L (ref 3.7–5.3)
SODIUM: 139 meq/L (ref 137–147)

## 2014-05-13 LAB — CBC WITH DIFFERENTIAL/PLATELET
BASOS PCT: 0 % (ref 0–1)
Basophils Absolute: 0 10*3/uL (ref 0.0–0.1)
EOS ABS: 0.7 10*3/uL (ref 0.0–0.7)
EOS PCT: 8 % — AB (ref 0–5)
HEMATOCRIT: 40.1 % (ref 36.0–46.0)
Hemoglobin: 13.8 g/dL (ref 12.0–15.0)
LYMPHS ABS: 3.1 10*3/uL (ref 0.7–4.0)
LYMPHS PCT: 37 % (ref 12–46)
MCH: 29.2 pg (ref 26.0–34.0)
MCHC: 34.4 g/dL (ref 30.0–36.0)
MCV: 85 fL (ref 78.0–100.0)
Monocytes Absolute: 0.9 10*3/uL (ref 0.1–1.0)
Monocytes Relative: 11 % (ref 3–12)
NEUTROS PCT: 44 % (ref 43–77)
Neutro Abs: 3.7 10*3/uL (ref 1.7–7.7)
Platelets: 312 10*3/uL (ref 150–400)
RBC: 4.72 MIL/uL (ref 3.87–5.11)
RDW: 13.9 % (ref 11.5–15.5)
WBC: 8.4 10*3/uL (ref 4.0–10.5)

## 2014-05-13 MED ORDER — IPRATROPIUM-ALBUTEROL 0.5-2.5 (3) MG/3ML IN SOLN
3.0000 mL | Freq: Once | RESPIRATORY_TRACT | Status: AC
Start: 2014-05-13 — End: 2014-05-13
  Administered 2014-05-13: 3 mL via RESPIRATORY_TRACT
  Filled 2014-05-13: qty 3

## 2014-05-13 MED ORDER — ALBUTEROL SULFATE HFA 108 (90 BASE) MCG/ACT IN AERS: 2.0000 | INHALATION_SPRAY | RESPIRATORY_TRACT | Status: DC | PRN

## 2014-05-13 MED ORDER — METHYLPREDNISOLONE SODIUM SUCC 125 MG IJ SOLR
60.0000 mg | Freq: Two times a day (BID) | INTRAMUSCULAR | Status: DC
  Administered 2014-05-13 – 2014-05-15 (×4): 60 mg via INTRAVENOUS
  Filled 2014-05-13 (×2): qty 2
  Filled 2014-05-13 (×6): qty 0.96

## 2014-05-13 MED ORDER — MOMETASONE FURO-FORMOTEROL FUM 100-5 MCG/ACT IN AERO
2.0000 | INHALATION_SPRAY | Freq: Two times a day (BID) | RESPIRATORY_TRACT | Status: DC
  Administered 2014-05-13 – 2014-05-16 (×6): 2 via RESPIRATORY_TRACT
  Filled 2014-05-13: qty 8.8

## 2014-05-13 MED ORDER — HEPARIN SODIUM (PORCINE) 5000 UNIT/ML IJ SOLN
5000.0000 [IU] | Freq: Three times a day (TID) | INTRAMUSCULAR | Status: DC
  Administered 2014-05-13 – 2014-05-16 (×8): 5000 [IU] via SUBCUTANEOUS
  Filled 2014-05-13 (×11): qty 1

## 2014-05-13 MED ORDER — ALBUTEROL SULFATE (2.5 MG/3ML) 0.083% IN NEBU
2.5000 mg | INHALATION_SOLUTION | RESPIRATORY_TRACT | Status: AC | PRN
  Administered 2014-05-13 – 2014-05-14 (×2): 2.5 mg via RESPIRATORY_TRACT
  Filled 2014-05-13 (×2): qty 3

## 2014-05-13 MED ORDER — ACETAMINOPHEN 325 MG PO TABS
650.0000 mg | ORAL_TABLET | Freq: Once | ORAL | Status: AC
  Administered 2014-05-13: 650 mg via ORAL
  Filled 2014-05-13: qty 2

## 2014-05-13 MED ORDER — IBUPROFEN 400 MG PO TABS
400.0000 mg | ORAL_TABLET | Freq: Four times a day (QID) | ORAL | Status: DC | PRN
  Administered 2014-05-13 – 2014-05-15 (×2): 400 mg via ORAL
  Filled 2014-05-13 (×3): qty 1

## 2014-05-13 MED ORDER — IPRATROPIUM BROMIDE 0.02 % IN SOLN
0.5000 mg | Freq: Once | RESPIRATORY_TRACT | Status: AC
  Administered 2014-05-13: 0.5 mg via RESPIRATORY_TRACT
  Filled 2014-05-13: qty 2.5

## 2014-05-13 MED ORDER — ALBUTEROL (5 MG/ML) CONTINUOUS INHALATION SOLN
15.0000 mg/h | INHALATION_SOLUTION | Freq: Once | RESPIRATORY_TRACT | Status: AC
  Administered 2014-05-13: 15 mg/h via RESPIRATORY_TRACT
  Filled 2014-05-13: qty 20

## 2014-05-13 MED ORDER — HYDROCODONE-ACETAMINOPHEN 5-325 MG PO TABS
1.0000 | ORAL_TABLET | Freq: Four times a day (QID) | ORAL | Status: DC | PRN
  Administered 2014-05-13 – 2014-05-16 (×8): 1 via ORAL
  Filled 2014-05-13 (×8): qty 1

## 2014-05-13 MED ORDER — SODIUM CHLORIDE 0.9 % IV SOLN
INTRAVENOUS | Status: DC
  Administered 2014-05-13 – 2014-05-14 (×2): via INTRAVENOUS

## 2014-05-13 MED ORDER — PREDNISONE 20 MG PO TABS: 60.0000 mg | ORAL_TABLET | Freq: Every day | ORAL | Status: DC

## 2014-05-13 MED ORDER — ALBUTEROL SULFATE (2.5 MG/3ML) 0.083% IN NEBU
5.0000 mg | INHALATION_SOLUTION | Freq: Once | RESPIRATORY_TRACT | Status: AC
  Administered 2014-05-13: 5 mg via RESPIRATORY_TRACT

## 2014-05-13 MED ORDER — METHYLPREDNISOLONE SODIUM SUCC 125 MG IJ SOLR
125.0000 mg | Freq: Once | INTRAMUSCULAR | Status: AC
  Administered 2014-05-13: 125 mg via INTRAVENOUS
  Filled 2014-05-13: qty 2

## 2014-05-13 MED ORDER — MAGNESIUM SULFATE 40 MG/ML IJ SOLN
2.0000 g | Freq: Once | INTRAMUSCULAR | Status: AC
  Administered 2014-05-13: 2 g via INTRAVENOUS
  Filled 2014-05-13: qty 50

## 2014-05-13 NOTE — Progress Notes (Signed)
Kathleen [name] [id number] Admission Data: 05/13/2014 7:14 PM Attending Provider: Charlynne Cousins, MD  PCP:No primary provider on file. Consults/ Treatment Team:    Kathleen [name] is a 53 y.o. female patient admitted from ED awake, alert  & orientated  X 3,  Full Code, VSS - Blood pressure 123/77, pulse 79, temperature 98.1 F (36.7 C), temperature source Oral, resp. rate 18, height 5\' 2"  (1.575 m), weight 71.215 kg (157 lb), SpO2 95.00%.,   Allergies:   Allergies  Allergen Reactions  . Tramadol Nausea Only     Past Medical History  Diagnosis Date  . Asthma   . Bronchitis     Pt orientation to unit, room and routine. Information packet given to patient/family and safety video watched.  Admission INP armband ID verified with patient/family, and in place. SR up x 2, fall risk assessment complete with Patient and family verbalizing understanding of risks associated with falls. Pt verbalizes an understanding of how to use the call bell and to call for help before getting out of bed.   Will cont to monitor and assist as needed.  Elissa Hefty Araya Roel, RN 05/13/2014 7:14 PM

## 2014-05-13 NOTE — H&P (Addendum)
[facility] History and Physical  Kinza Gouveia CHY:[id number] DOB: Mar 08, 1961 DOA: 05/13/2014  Referring physician: Dr. Betsey Holiday PCP: No primary provider on file.   Chief Complaint: SOB  HPI: Kathleen [name] is a 53 y.o. female  Past medical history of smoking, hypertension asthma previously intubated in the past that comes in for shortness of breath that started 3 days prior to admission. She relates she went to [place] long 3 days prior to this visit was started on steroids with no improvement. So she comes in back today with worsening shortness of breath. She relates she cannot walk to the bathroom without being short of breath. She relates she took her prednisone and her medication with her. She denies any smoking, she works in [employer] and says she has been exposed to fumes which are probably contributing to her shortness of breath. She relates her albuterol is not bleeding or shortness of breath as he did in the past.  In the ED: She was given several treatments of albuterol was started on Solu-Medrol was also given magnesium.    Review of Systems:  Constitutional:  No weight loss, night sweats, Fevers, chills, fatigue.  HEENT:  No headaches, Difficulty swallowing,Tooth/dental problems,Sore throat,  No sneezing, itching, ear ache, nasal congestion, post nasal drip,  Cardio-vascular:  No chest pain, Orthopnea, PND, swelling in lower extremities, anasarca, dizziness, palpitations  GI:  No heartburn, indigestion, abdominal pain, nausea, vomiting, diarrhea, change in bowel habits, loss of appetite  Resp:  No shortness of breath with exertion or at rest. No excess mucus, no productive cough, No non-productive cough, No coughing up of blood.No change in color of mucus.No wheezing.No chest wall deformity  Skin:  no rash or lesions.  GU:  no dysuria, change in color of urine, no urgency or frequency. No flank pain.  Musculoskeletal:  No joint pain or swelling. No decreased  range of motion. No back pain.  Psych:  No change in mood or affect. No depression or anxiety. No memory loss.   Past Medical History  Diagnosis Date  . Asthma   . Bronchitis    History reviewed. No pertinent past surgical history. Social History:  reports that she has never smoked. She does not have any smokeless tobacco history on file. She reports that she does not drink alcohol or use illicit drugs.  Allergies  Allergen Reactions  . Tramadol Nausea Only    Family History  Problem Relation Age of Onset  . Cancer - Colon Mother   . Cancer - Prostate Father      Prior to Admission medications   Medication Sig Start Date End Date Taking? Authorizing Provider  albuterol (PROVENTIL HFA;VENTOLIN HFA) 108 (90 BASE) MCG/ACT inhaler Inhale 4 puffs into the lungs every 4 (four) hours as needed for wheezing or shortness of breath.   Yes Historical Provider, MD  albuterol (PROVENTIL) (2.5 MG/3ML) 0.083% nebulizer solution Take 2.5 mg by nebulization every 6 (six) hours as needed for wheezing or shortness of breath.   Yes Historical Provider, MD  HYDROcodone-acetaminophen (NORCO/VICODIN) 5-325 MG per tablet Take 1 tablet by mouth every 6 (six) hours as needed for moderate pain.   Yes Historical Provider, MD  ibuprofen (ADVIL,MOTRIN) 200 MG tablet Take 400 mg by mouth every 6 (six) hours as needed for moderate pain.   Yes Historical Provider, MD  albuterol (PROVENTIL HFA;VENTOLIN HFA) 108 (90 BASE) MCG/ACT inhaler Inhale 2 puffs into the lungs every 4 (four) hours as needed for wheezing or shortness of  breath. 05/13/14   Merryl Hacker, MD  predniSONE (DELTASONE) 20 MG tablet Take 3 tablets (60 mg total) by mouth daily. 05/13/14   Merryl Hacker, MD   Physical Exam: Filed Vitals:   05/13/14 1715  BP: 109/62  Pulse: 84  Temp:   Resp: 23    BP 109/62  Pulse 84  Temp(Src) 97.5 F (36.4 C) (Oral)  Resp 23  Ht 5\' 2"  (1.575 m)  Wt 76.204 kg (168 lb)  BMI 30.72 kg/m2  SpO2  99%  General:  Appears calm and comfortable Eyes: PERRL, normal lids, irises & conjunctiva ENT: grossly normal hearing, lips & tongue Neck: no LAD, masses or thyromegaly Cardiovascular: RRR, no m/r/g. No LE edema. Respiratory: Good air movement with rhonchi bilaterally. Abdomen: soft, ntnd Skin: no rash or induration seen on limited exam Musculoskeletal: grossly normal tone BUE/BLE Neurologic: grossly non-focal.          Labs on Admission:  Basic Metabolic Panel:  Recent Labs Lab 05/13/14 1410  NA 139  K 4.0  CL 103  CO2 21  GLUCOSE 101*  BUN 13  CREATININE 1.24*  CALCIUM 9.1   Liver Function Tests: No results found for this basename: AST, ALT, ALKPHOS, BILITOT, PROT, ALBUMIN,  in the last 168 hours No results found for this basename: LIPASE, AMYLASE,  in the last 168 hours No results found for this basename: AMMONIA,  in the last 168 hours CBC:  Recent Labs Lab 05/13/14 1410  WBC 8.4  NEUTROABS 3.7  HGB 13.8  HCT 40.1  MCV 85.0  PLT 312   Cardiac Enzymes: No results found for this basename: CKTOTAL, CKMB, CKMBINDEX, TROPONINI,  in the last 168 hours  BNP (last 3 results) No results found for this basename: PROBNP,  in the last 8760 hours CBG: No results found for this basename: GLUCAP,  in the last 168 hours  Radiological Exams on Admission: Dg Chest Portable 1 View  05/13/2014   CLINICAL DATA:  Shortness of breath, asthma.  EXAM: PORTABLE CHEST - 1 VIEW  COMPARISON:  April 08, 2014.  FINDINGS: The heart size and mediastinal contours are within normal limits. Both lungs are clear. No pneumothorax or pleural effusion is noted. The visualized skeletal structures are unremarkable.  IMPRESSION: No acute cardiopulmonary abnormality seen.   Electronically Signed   By: Sabino Dick M.D.   On: 05/13/2014 14:42    EKG: Independently reviewed. Normal sinus rhythm with left axis deviation  Assessment/Plan Acute respiratory failure due to Asthma exacerbation: -  Admit her to med surge, start her on IV steroids, albuterol when necessary. Factors probably chemical fumes. - She does not have a fever nor white count colonoscopy. Antibiotics at this time. - Ambulate her in the morning to see she desats.    AKI: - This probably due to insensible losses and use of ibuprofen. Hold the ibuprofen start her on IV fluids check a basic metabolic panel in the morning.  Code Status: full Family Communication: none Disposition Plan: inpatinet  Time spent: 80 minutes  [facility] Pager [phone]  **Disclaimer: This note may have been dictated with voice recognition software. Similar sounding words can inadvertently be transcribed and this note may contain transcription errors which may not have been corrected upon publication of note.**

## 2014-05-13 NOTE — ED Notes (Signed)
Pt here for asthma exascerbation, onset this am, sts she is out of her neb treatments.

## 2014-05-13 NOTE — ED Notes (Signed)
Dr. Pollina at bedside   

## 2014-05-13 NOTE — ED Notes (Signed)
MD at bedside. 

## 2014-05-13 NOTE — ED Provider Notes (Signed)
CSN: [id number]     Arrival date & time 05/13/14  1338 History   First MD Initiated Contact with Patient 05/13/14 1356     Chief Complaint  Patient presents with  . Asthma     (Consider location/radiation/quality/duration/timing/severity/associated sxs/prior Treatment) HPI  This is a 53 year old female with a history of asthma who presents with shortness of breath. Patient reports increasing shortness of breath her last week. Common triggers include heat. Patient states that her air conditioning went out. She's had increasing shortness of breath with wheezing. She is out of her inhaler at home. She also reports cough productive of sputum. She denies any chest pain. Patient has had multiple hospitalizations but no intubations for her asthma.  Of note, patient also concerned with multiple lumps she has noted over the last week over her bilateral breasts.  Noted to be tender.  Past Medical History  Diagnosis Date  . Asthma   . Bronchitis    History reviewed. No pertinent past surgical history. Family History  Problem Relation Age of Onset  . Cancer - Colon Mother   . Cancer - Prostate Father    History  Substance Use Topics  . Smoking status: Never Smoker   . Smokeless tobacco: Not on file  . Alcohol Use: No   OB History   Grav Para Term Preterm Abortions TAB SAB Ect Mult Living                 Review of Systems  Constitutional: Negative for fever.  Respiratory: Positive for cough, shortness of breath and wheezing. Negative for chest tightness.   Cardiovascular: Negative for chest pain and leg swelling.  Gastrointestinal: Negative for nausea, vomiting and abdominal pain.  Genitourinary: Negative for dysuria.  Neurological: Negative for headaches.  All other systems reviewed and are negative.     Allergies  Tramadol  Home Medications   Prior to Admission medications   Medication Sig Start Date End Date Taking? Authorizing Provider  albuterol (PROVENTIL HFA;VENTOLIN  HFA) 108 (90 BASE) MCG/ACT inhaler Inhale 4 puffs into the lungs every 4 (four) hours as needed for wheezing or shortness of breath.   Yes Historical Provider, MD  albuterol (PROVENTIL) (2.5 MG/3ML) 0.083% nebulizer solution Take 2.5 mg by nebulization every 6 (six) hours as needed for wheezing or shortness of breath.   Yes Historical Provider, MD  HYDROcodone-acetaminophen (NORCO/VICODIN) 5-325 MG per tablet Take 1 tablet by mouth every 6 (six) hours as needed for moderate pain.   Yes Historical Provider, MD  ibuprofen (ADVIL,MOTRIN) 200 MG tablet Take 400 mg by mouth every 6 (six) hours as needed for moderate pain.   Yes Historical Provider, MD  albuterol (PROVENTIL HFA;VENTOLIN HFA) 108 (90 BASE) MCG/ACT inhaler Inhale 2 puffs into the lungs every 4 (four) hours as needed for wheezing or shortness of breath. 05/13/14   Merryl Hacker, MD  predniSONE (DELTASONE) 20 MG tablet Take 3 tablets (60 mg total) by mouth daily. 05/13/14   Merryl Hacker, MD   BP 123/77  Pulse 79  Temp(Src) 98.1 F (36.7 C) (Oral)  Resp 18  Ht 5\' 2"  (1.575 m)  Wt 157 lb (71.215 kg)  BMI 28.71 kg/m2  SpO2 95% Physical Exam  Nursing note and vitals reviewed. Constitutional: She is oriented to person, place, and time. She appears well-developed and well-nourished.  tachpneic  HENT:  Head: Normocephalic and atraumatic.  Eyes: Pupils are equal, round, and reactive to light.  Neck: No JVD present.  Cardiovascular: Normal rate,  regular rhythm and normal heart sounds.   No murmur heard. Pulmonary/Chest: Effort normal. No respiratory distress. She has wheezes. Right breast exhibits tenderness. Right breast exhibits no inverted nipple, no nipple discharge and no skin change. Left breast exhibits tenderness. Left breast exhibits no inverted nipple, no nipple discharge and no skin change.    Increased WOB, tachypnea  Abdominal: Soft. There is no tenderness.  Musculoskeletal: She exhibits no edema.  Neurological: She is  alert and oriented to person, place, and time.  Skin: Skin is warm and dry.  Psychiatric: She has a normal mood and affect.    ED Course  Procedures (including critical care time) Labs Review Labs Reviewed  CBC WITH DIFFERENTIAL - Abnormal; Notable for the following:    Eosinophils Relative 8 (*)    All other components within normal limits  BASIC METABOLIC PANEL - Abnormal; Notable for the following:    Glucose, Bld 101 (*)    Creatinine, Ser 1.24 (*)    GFR calc non Af Amer 49 (*)    GFR calc Af Amer 56 (*)    All other components within normal limits  BASIC METABOLIC PANEL    Imaging Review Dg Chest Portable 1 View  05/13/2014   CLINICAL DATA:  Shortness of breath, asthma.  EXAM: PORTABLE CHEST - 1 VIEW  COMPARISON:  April 08, 2014.  FINDINGS: The heart size and mediastinal contours are within normal limits. Both lungs are clear. No pneumothorax or pleural effusion is noted. The visualized skeletal structures are unremarkable.  IMPRESSION: No acute cardiopulmonary abnormality seen.   Electronically Signed   By: Sabino Dick M.D.   On: 05/13/2014 14:42     EKG Interpretation   Date/Time:  Monday May 13 2014 14:06:44 EDT Ventricular Rate:  84 PR Interval:  158 QRS Duration: 83 QT Interval:  392 QTC Calculation: 463 R Axis:   -49 Text Interpretation:  Sinus rhythm Left axis deviation Low voltage,  precordial leads Abnormal R-wave progression, early transition Confirmed  by Esbeydi Manago  MD, Paytan Recine ([id number]) on 05/13/2014 4:10:39 PM       Medications  albuterol (PROVENTIL) (2.5 MG/3ML) 0.083% nebulizer solution 2.5 mg (not administered)  HYDROcodone-acetaminophen (NORCO/VICODIN) 5-325 MG per tablet 1 tablet (1 tablet Oral Given 05/13/14 1930)  ibuprofen (ADVIL,MOTRIN) tablet 400 mg (not administered)  methylPREDNISolone sodium succinate (SOLU-MEDROL) 125 mg/2 mL injection 60 mg (60 mg Intravenous Given 05/13/14 1930)  mometasone-formoterol (DULERA) 100-5 MCG/ACT inhaler 2 puff (not  administered)  heparin injection 5,000 Units (not administered)  0.9 %  sodium chloride infusion ( Intravenous New Bag/Given 05/13/14 1902)  albuterol (PROVENTIL) (2.5 MG/3ML) 0.083% nebulizer solution 5 mg (5 mg Nebulization Given 05/13/14 1346)  albuterol (PROVENTIL,VENTOLIN) solution continuous neb (15 mg/hr Nebulization Given 05/13/14 1422)  ipratropium (ATROVENT) nebulizer solution 0.5 mg (0.5 mg Nebulization Given 05/13/14 1422)  magnesium sulfate IVPB 2 g 50 mL (0 g Intravenous Stopped 05/13/14 1540)  methylPREDNISolone sodium succinate (SOLU-MEDROL) 125 mg/2 mL injection 125 mg (125 mg Intravenous Given 05/13/14 1431)  acetaminophen (TYLENOL) tablet 650 mg (650 mg Oral Given 05/13/14 1516)  ipratropium-albuterol (DUONEB) 0.5-2.5 (3) MG/3ML nebulizer solution 3 mL (3 mLs Nebulization Given 05/13/14 1619)    MDM   Final diagnoses:  Asthma  Breast nodule   Patient with SOB and wheezing.  Hx of hospitalizations.  GIven continuous neb, mag, and steroids.  EKG reassuring and CXR without PNA.  On recheck, patient more comfortable but continues to wheeze.  Patient wishes to be discharged if possible.  Will repeat duoneb and Dr. Betsey Holiday to follow-up and re-evaluation.    Merryl Hacker, MD 05/13/14 1949

## 2014-05-13 NOTE — ED Notes (Signed)
Dr. Horton at bedside. 

## 2014-05-13 NOTE — ED Notes (Signed)
Claiborne Billings, RT notified to evaluate patient

## 2014-05-13 NOTE — ED Provider Notes (Signed)
Patient signed out to me by Doctor Horton to followup on her improvement after treatment. Patient was seen for asthma exacerbation. She started having significant wheezing this morning, her nebulizer machine has been broken and she did not have a Heller. She was initially very resistant to admission, so Doctor Horton gave her treatments here in the ER and observed. Repeat evaluation by myself reveals continued wheezing and rhonchi scattered throughout lung fields. She has not, however, hypoxic. She has not had enough improvement with her breathing, however, to consider outpatient therapy. I discussed this with the patient and she agrees to admission at this time. Patient will be admitted unassigned patient for further management acute asthma exacerbation.  Orpah Greek, MD 05/13/14 [phone]

## 2014-05-13 NOTE — ED Notes (Signed)
Pt ambulatory to restroom with steady gait; denies increased shortness of breath while ambulating; O2 sats 100%.

## 2014-05-14 LAB — BASIC METABOLIC PANEL
BUN: 10 mg/dL (ref 6–23)
CHLORIDE: 102 meq/L (ref 96–112)
CO2: 23 mEq/L (ref 19–32)
Calcium: 9.1 mg/dL (ref 8.4–10.5)
Creatinine, Ser: 0.77 mg/dL (ref 0.50–1.10)
GLUCOSE: 113 mg/dL — AB (ref 70–99)
Potassium: 4 mEq/L (ref 3.7–5.3)
Sodium: 138 mEq/L (ref 137–147)

## 2014-05-14 MED ORDER — IPRATROPIUM BROMIDE 0.02 % IN SOLN
RESPIRATORY_TRACT | Status: AC
  Administered 2014-05-14: 0.5 mg via RESPIRATORY_TRACT
  Filled 2014-05-14: qty 2.5

## 2014-05-14 MED ORDER — ONDANSETRON HCL 4 MG/2ML IJ SOLN
4.0000 mg | Freq: Four times a day (QID) | INTRAMUSCULAR | Status: DC | PRN
  Administered 2014-05-14 – 2014-05-15 (×3): 4 mg via INTRAVENOUS
  Filled 2014-05-14 (×3): qty 2

## 2014-05-14 MED ORDER — ENSURE COMPLETE PO LIQD
237.0000 mL | Freq: Two times a day (BID) | ORAL | Status: DC
  Administered 2014-05-14 – 2014-05-16 (×4): 237 mL via ORAL

## 2014-05-14 MED ORDER — ALBUTEROL SULFATE (2.5 MG/3ML) 0.083% IN NEBU: 2.5000 mg | INHALATION_SOLUTION | RESPIRATORY_TRACT | Status: AC | PRN

## 2014-05-14 MED ORDER — ONDANSETRON HCL 4 MG PO TABS
4.0000 mg | ORAL_TABLET | Freq: Once | ORAL | Status: AC
  Administered 2014-05-14: 4 mg via ORAL
  Filled 2014-05-14: qty 1

## 2014-05-14 MED ORDER — IPRATROPIUM BROMIDE 0.02 % IN SOLN
0.5000 mg | RESPIRATORY_TRACT | Status: DC
  Administered 2014-05-14 (×3): 0.5 mg via RESPIRATORY_TRACT
  Filled 2014-05-14 (×2): qty 2.5

## 2014-05-14 MED ORDER — IPRATROPIUM-ALBUTEROL 0.5-2.5 (3) MG/3ML IN SOLN
3.0000 mL | Freq: Three times a day (TID) | RESPIRATORY_TRACT | Status: DC
  Administered 2014-05-14 – 2014-05-15 (×2): 3 mL via RESPIRATORY_TRACT
  Filled 2014-05-14 (×2): qty 3

## 2014-05-14 NOTE — Care Management Note (Signed)
    Page 1 of 1   05/16/2014     2:07:33 PM CARE MANAGEMENT NOTE 05/16/2014  Patient:  [facility]   Account Number:  [id number]  Date Initiated:  05/14/2014  Documentation initiated by:  Tomi Bamberger  Subjective/Objective Assessment:   dx asthma exa  admit- from home.     Action/Plan:   Anticipated DC Date:  05/15/2014   Anticipated DC Plan:  [place]  CM consult  Follow-up appt scheduled      PAC Choice  DURABLE MEDICAL EQUIPMENT   Choice offered to / List presented to:  C-1 Patient   DME arranged  NEBULIZER MACHINE      DME agency  [place].        Status of service:  Completed, signed off Medicare Important Message given?  YES (If response is "NO", the following Medicare IM given date fields will be blank) Date Medicare IM given:  05/16/2014 Date Additional Medicare IM given:    Discharge Disposition:  HOME/SELF CARE  Per UR Regulation:  Reviewed for med. necessity/level of care/duration of stay  If discussed at [place] of Stay Meetings, dates discussed:    Comments:  05/14/14 [place] RN, BSN [phone] patient is from home, NCM scheduled an hosp f[facility] patient on 6/12 at 10:45 .

## 2014-05-14 NOTE — Progress Notes (Signed)
INITIAL NUTRITION ASSESSMENT  Pt meets criteria for severe MALNUTRITION in the context of chronic illness as evidenced by 14% weight loss with <75% estimated energy intake in the past month per pt report.   DOCUMENTATION CODES Per approved criteria  -Severe malnutrition in the context of chronic illness   INTERVENTION: - Ensure Complete BID - Recommend SLP evaluation due to pt's concerns of having choking episodes in the past 2 weeks - RD to monitor plan of care  NUTRITION DIAGNOSIS: Unintended weight loss related to vomiting after meals as evidenced by pt report.   Goal: 1. No vomiting after meals 2. Pt to consume >90% of meals/supplements  Monitor:  Weights, labs, intake, vomiting  Reason for Assessment: Malnutrition screening tool   53 y.o. female  Admitting Dx: Shortness of breath  ASSESSMENT: Pt with history of smoking, hypertension asthma previously intubated in the past that comes in for shortness of breath that started 3 days prior to admission. She relates she went to [place] long 3 days prior to this visit was started on steroids with no improvement. So she comes in back today with worsening shortness of breath. She relates she cannot walk to the bathroom without being short of breath.   -Pt discussed during multidisciplinary rounds.  -Pt reports that for the past 2 weeks she would vomit up every meal she ate, and she was eating 3 meals/day of regular foods like salads for lunch, a bagel with jelly for breakfast  -Pt expressed concern that she feels like sometimes she chokes on food and is worried it is going into her windpipe and wants to have a SLP swallow evaluation, text paged MD with this request -Reports 25 pound unintended weight loss in the past 2 weeks due to the vomiting -Was able to eat all of her breakfast this morning, pancakes, sausage, and boiled egg without vomiting or having nausea as she was given Zofran which controlled her symptoms -States she has hx  of esophageal problems in 2010 during which time she couldn't swallow foods and fluids but does not remember what was causing it -Performed nutrition focused physical exam which was WNL   Height: Ht Readings from Last 1 Encounters:  05/13/14 5\' 2"  (1.575 m)    Weight: Wt Readings from Last 1 Encounters:  05/13/14 157 lb (71.215 kg)    Ideal Body Weight: 110 lbs  % Ideal Body Weight: 143%  Wt Readings from Last 10 Encounters:  05/13/14 157 lb (71.215 kg)    Usual Body Weight: 182 lbs per pt  % Usual Body Weight: 86%  BMI:  Body mass index is 28.71 kg/(m^2).  Estimated Nutritional Needs: Kcal: 1300-1500 Protein: 60-75g Fluid: 1.3-1.5L/day   Skin: Intact  Diet Order: General  EDUCATION NEEDS: -No education needs identified at this time   Intake/Output Summary (Last 24 hours) at 05/14/14 1140 Last data filed at 05/13/14 2330  Gross per 24 hour  Intake    340 ml  Output      0 ml  Net    340 ml    Last BM: 6/1  Labs:   Recent Labs Lab 05/13/14 1410 05/14/14 0640  NA 139 138  K 4.0 4.0  CL 103 102  CO2 21 23  BUN 13 10  CREATININE 1.24* 0.77  CALCIUM 9.1 9.1  GLUCOSE 101* 113*    CBG (last 3)  No results found for this basename: GLUCAP,  in the last 72 hours  Scheduled Meds: . heparin  5,000 Units Subcutaneous  3 times per day  . ipratropium  0.5 mg Nebulization Q4H  . methylPREDNISolone (SOLU-MEDROL) injection  60 mg Intravenous Q12H  . mometasone-formoterol  2 puff Inhalation BID    Continuous Infusions:   Past Medical History  Diagnosis Date  . Asthma   . Bronchitis     History reviewed. No pertinent past surgical history.  Glory Rosebush MS, RD, LDN [phone] Pager [phone] Weekend/After Hours Pager

## 2014-05-14 NOTE — Progress Notes (Signed)
Utilization review completed.  

## 2014-05-14 NOTE — Progress Notes (Signed)
Note: This document was prepared with digital dictation and possible smart phrase technology. Any transcriptional errors that result from this process are unintentional.   Kathleen [name] STM:[id number] DOB: Jun 03, 1961 DOA: 05/13/2014 PCP: No primary provider on file.  Brief narrative: 53 y/o ?, known smoker, ?Asthma/COPD [place] spirometry sometime in the past at [facility]] x 4 yrs, nodular breasts c h/o recent Ed eval for SOB, Difficulty breathing admitted to [facility] hospital 05/13/14  Past medical history-As per Problem list Chart reviewed as below- reviewed  Consultants:   none  Procedures:  none  Antibiotics:  none   Subjective  7 day h/o wheeze and cough.  Nebuliser broke ~5 /30. Usually tyakes 2 Rx a day of inhaler  ? 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   Objective    Interim History: C/o mild post tussive n to RN  Telemetry: Non tele   Objective: Filed Vitals:   05/13/14 2019 05/13/14 2050 05/14/14 0449 05/14/14 0601  BP:  125/69  100/63  Pulse:  83  79  Temp:  98 F (36.7 C)  97.8 F (36.6 C)  TempSrc:  Oral  Oral  Resp:  18  18  Height:      Weight:      SpO2: 92% 96% 97% 98%    Intake/Output Summary (Last 24 hours) at 05/14/14 0820 Last data filed at 05/13/14 2330  Gross per 24 hour  Intake    340 ml  Output      0 ml  Net    340 ml    Exam:  General:  Alert pleasant oriented in NAD Cardiovascular:  s1 s2 no m/r/g Respiratory: wheezes throughout with poor ae Abdomen:  Soft, nt/nd Skin no le edema Neuro intact  Data Reviewed: Basic Metabolic Panel:  Recent Labs Lab 05/13/14 1410 05/14/14 0640  NA 139 138  K 4.0 4.0  CL 103 102  CO2 21 23    GLUCOSE 101* 113*  BUN 13 10  CREATININE 1.24* 0.77  CALCIUM 9.1 9.1   Liver Function Tests: No results found for this basename: AST, ALT, ALKPHOS, BILITOT, PROT, ALBUMIN,  in the last 168 hours No results found for this basename: LIPASE, AMYLASE,  in the last 168 hours No results found for this basename: AMMONIA,  in the last 168 hours CBC:  Recent Labs Lab 05/13/14 1410  WBC 8.4  NEUTROABS 3.7  HGB 13.8  HCT 40.1  MCV 85.0  PLT 312   Cardiac Enzymes: No results found for this basename: CKTOTAL, CKMB, CKMBINDEX, TROPONINI,  in the last 168 hours BNP: No components found with this basename: POCBNP,  CBG: No results found for this basename: GLUCAP,  in the last 168 hours  No results found for this or any previous visit (from the past 240 hour(s)).   Studies:              All Imaging reviewed and is as per above notation   Scheduled Meds: . heparin  5,000 Units Subcutaneous 3 times per day  . methylPREDNISolone (SOLU-MEDROL) injection  60 mg Intravenous Q12H  . mometasone-formoterol  2 puff Inhalation BID  . ondansetron  4 mg Oral Once  Continuous Infusions: . sodium chloride 100 mL/hr at 05/14/14 0444     Assessment/Plan: 1. Acute hypoxic resp failure 2/2 to Acute asthma exacerbation-Continue Solumedrol 60- bid IV today, albuterol Neb q 4 prn, Atrovent 0.5 q 4 hrly, Dulera 2 puffs bid.  Ambulate with O2 sats, OOB.  No record PFT's so needs to be done as OP to confirm Asthma vs COPD 2. Nausea-Prn zofran for post-tussive vomiting.  IV saline locked this am 3. Chest wall pain-2/2 to coughing.  Continue Noprco 1 q6 prn/Advil 400 q 6 prn 4. Impaired glucose tolerance 2/2 to steroids.  Monitor CBG's q am only-OP testing if prn further. 5. Fatty dense breasts-deferred exam0-will need definitive mammogram q 2 yrly based on USPTF recs   Code Status: Full Family Communication:  None bedside Disposition Plan: 2-3 days   Verneita Griffes, MD  [facility] Pager  [phone] 05/14/2014, 8:20 AM    LOS: 1 day

## 2014-05-15 DIAGNOSIS — E43 Unspecified severe protein-calorie malnutrition: Secondary | ICD-10-CM | POA: Insufficient documentation

## 2014-05-15 LAB — GLUCOSE, CAPILLARY: Glucose-Capillary: 106 mg/dL — ABNORMAL HIGH (ref 70–99)

## 2014-05-15 MED ORDER — BENZONATATE 100 MG PO CAPS
100.0000 mg | ORAL_CAPSULE | Freq: Three times a day (TID) | ORAL | Status: DC | PRN
  Administered 2014-05-15: 100 mg via ORAL
  Filled 2014-05-15 (×3): qty 1

## 2014-05-15 MED ORDER — IPRATROPIUM-ALBUTEROL 0.5-2.5 (3) MG/3ML IN SOLN
3.0000 mL | Freq: Four times a day (QID) | RESPIRATORY_TRACT | Status: DC
  Administered 2014-05-15 – 2014-05-16 (×4): 3 mL via RESPIRATORY_TRACT
  Filled 2014-05-15 (×4): qty 3

## 2014-05-15 MED ORDER — LEVOFLOXACIN 750 MG PO TABS
750.0000 mg | ORAL_TABLET | Freq: Every day | ORAL | Status: DC
  Administered 2014-05-15 – 2014-05-16 (×2): 750 mg via ORAL
  Filled 2014-05-15 (×2): qty 1

## 2014-05-15 MED ORDER — ALBUTEROL SULFATE (2.5 MG/3ML) 0.083% IN NEBU: 2.5000 mg | INHALATION_SOLUTION | RESPIRATORY_TRACT | Status: DC | PRN

## 2014-05-15 MED ORDER — GUAIFENESIN ER 600 MG PO TB12
600.0000 mg | ORAL_TABLET | Freq: Two times a day (BID) | ORAL | Status: DC
  Administered 2014-05-15 – 2014-05-16 (×3): 600 mg via ORAL
  Filled 2014-05-15 (×4): qty 1

## 2014-05-15 MED ORDER — METHYLPREDNISOLONE SODIUM SUCC 125 MG IJ SOLR
60.0000 mg | Freq: Three times a day (TID) | INTRAMUSCULAR | Status: DC
  Administered 2014-05-15 – 2014-05-16 (×3): 60 mg via INTRAVENOUS
  Filled 2014-05-15 (×6): qty 0.96

## 2014-05-15 NOTE — Progress Notes (Signed)
PATIENT DETAILS Name: Kathleen [name] Age: 53 y.o. Sex: female Date of Birth: 1961-05-16 Admit Date: 05/13/2014 Admitting Physician Charlynne Cousins, MD PCP:No primary provider on file.  Subjective: Improving, still wheezing.  Assessment/Plan: Active Problems:   Asthma exacerbation -improving-but still with coarse rhonchi-per patient-not yet back to baseline -admitted and started on IV Solumedrol, nebs,add Levaquin today. Moving air -but still wheezing. Incentive Spirometry and Flutter valve    Acute Hypoxic respiratory failure -secondary to above -resolved    AKI (acute kidney injury) -resolved    Protein-calorie malnutrition, severe -in the context of chronic illness  -supplements  Disposition: Remain inpatient  DVT Prophylaxis: Prophylactic Heparin   Code Status: Full code   Family Communication None  Procedures:  None  CONSULTS:  None  Time spent 40 minutes-which includes 50% of the time with face-to-face with patient/ family and coordinating care related to the above assessment and plan.    MEDICATIONS: Scheduled Meds: . feeding supplement (ENSURE COMPLETE)  237 mL Oral BID BM  . guaiFENesin  600 mg Oral BID  . heparin  5,000 Units Subcutaneous 3 times per day  . ipratropium-albuterol  3 mL Nebulization Q6H  . levofloxacin  750 mg Oral Daily  . methylPREDNISolone (SOLU-MEDROL) injection  60 mg Intravenous 3 times per day  . mometasone-formoterol  2 puff Inhalation BID   Continuous Infusions:  PRN Meds:.albuterol, benzonatate, HYDROcodone-acetaminophen, ibuprofen, ondansetron (ZOFRAN) IV  Antibiotics: Anti-infectives   Start     Dose/Rate Route Frequency Ordered Stop   05/15/14 1000  levofloxacin (LEVAQUIN) tablet 750 mg     750 mg Oral Daily 05/15/14 0818         PHYSICAL EXAM: Vital signs in last 24 hours: Filed Vitals:   05/14/14 2034 05/14/14 2046 05/15/14 0527 05/15/14 0748  BP: 109/69  121/85   Pulse: 66  77   Temp:  97.4 F (36.3 C)  98.1 F (36.7 C)   TempSrc: Oral  Oral   Resp: 18  18   Height:      Weight:      SpO2: 97% 97% 92% 97%    Weight change:  Filed Weights   05/13/14 1347 05/13/14 1850  Weight: 76.204 kg (168 lb) 71.215 kg (157 lb)   Body mass index is 28.71 kg/(m^2).   Gen Exam: Awake and alert with clear speech.   Neck: Supple, No JVD.   Chest:Good air movement bilaterally-coarse rhonchi CVS: S1 S2 Regular, no murmurs.  Abdomen: soft, BS +, non tender, non distended.  Extremities: no edema, lower extremities warm to touch. Neurologic: Non Focal.   Skin: No Rash.   Wounds: N/A.   Intake/Output from previous day:  Intake/Output Summary (Last 24 hours) at 05/15/14 1240 Last data filed at 05/14/14 1300  Gross per 24 hour  Intake    120 ml  Output      0 ml  Net    120 ml     LAB RESULTS: CBC  Recent Labs Lab 05/13/14 1410  WBC 8.4  HGB 13.8  HCT 40.1  PLT 312  MCV 85.0  MCH 29.2  MCHC 34.4  RDW 13.9  LYMPHSABS 3.1  MONOABS 0.9  EOSABS 0.7  BASOSABS 0.0    Chemistries   Recent Labs Lab 05/13/14 1410 05/14/14 0640  NA 139 138  K 4.0 4.0  CL 103 102  CO2 21 23  GLUCOSE 101* 113*  BUN 13 10  CREATININE 1.24* 0.77  CALCIUM 9.1  9.1    CBG:  Recent Labs Lab 05/15/14 0805  GLUCAP 106*    GFR Estimated Creatinine Clearance: 75.1 ml/min (by C-G formula based on Cr of 0.77).  Coagulation profile No results found for this basename: INR, PROTIME,  in the last 168 hours  Cardiac Enzymes No results found for this basename: CK, CKMB, TROPONINI, MYOGLOBIN,  in the last 168 hours  No components found with this basename: POCBNP,  No results found for this basename: DDIMER,  in the last 72 hours No results found for this basename: HGBA1C,  in the last 72 hours No results found for this basename: CHOL, HDL, LDLCALC, TRIG, CHOLHDL, LDLDIRECT,  in the last 72 hours No results found for this basename: TSH, T4TOTAL, FREET3, T3FREE, THYROIDAB,  in  the last 72 hours No results found for this basename: VITAMINB12, FOLATE, FERRITIN, TIBC, IRON, RETICCTPCT,  in the last 72 hours No results found for this basename: LIPASE, AMYLASE,  in the last 72 hours  Urine Studies No results found for this basename: UACOL, UAPR, USPG, UPH, UTP, UGL, UKET, UBIL, UHGB, UNIT, UROB, ULEU, UEPI, UWBC, URBC, UBAC, CAST, CRYS, UCOM, BILUA,  in the last 72 hours  MICROBIOLOGY: No results found for this or any previous visit (from the past 240 hour(s)).  RADIOLOGY STUDIES/RESULTS: Dg Chest Portable 1 View  05/13/2014   CLINICAL DATA:  Shortness of breath, asthma.  EXAM: PORTABLE CHEST - 1 VIEW  COMPARISON:  April 08, 2014.  FINDINGS: The heart size and mediastinal contours are within normal limits. Both lungs are clear. No pneumothorax or pleural effusion is noted. The visualized skeletal structures are unremarkable.  IMPRESSION: No acute cardiopulmonary abnormality seen.   Electronically Signed   By: Sabino Dick M.D.   On: 05/13/2014 14:42    Shanker Kristeen Mans, MD  [facility] Pager:[phone]  If 7PM-7AM, please contact night-coverage [email or web address] Password [email or web address] 05/15/2014, 12:40 PM   LOS: 2 days   **Disclaimer: This note may have been dictated with voice recognition software. Similar sounding words can inadvertently be transcribed and this note may contain transcription errors which may not have been corrected upon publication of note.**

## 2014-05-16 DIAGNOSIS — J45901 Unspecified asthma with (acute) exacerbation: Secondary | ICD-10-CM

## 2014-05-16 DIAGNOSIS — R928 Other abnormal and inconclusive findings on diagnostic imaging of breast: Secondary | ICD-10-CM

## 2014-05-16 MED ORDER — HYDROCODONE-ACETAMINOPHEN 5-325 MG PO TABS: 1.0000 | ORAL_TABLET | Freq: Four times a day (QID) | ORAL | Status: DC | PRN

## 2014-05-16 MED ORDER — TRIAMCINOLONE ACETONIDE 0.025 % EX OINT: 1.0000 "application " | TOPICAL_OINTMENT | Freq: Two times a day (BID) | CUTANEOUS | Status: DC

## 2014-05-16 MED ORDER — PROMETHAZINE HCL 12.5 MG PO TABS: 12.5000 mg | ORAL_TABLET | Freq: Four times a day (QID) | ORAL | Status: DC | PRN

## 2014-05-16 MED ORDER — PREDNISONE 10 MG PO TABS: 60.0000 mg | ORAL_TABLET | Freq: Every day | ORAL | Status: DC

## 2014-05-16 MED ORDER — LEVOFLOXACIN 750 MG PO TABS: 750.0000 mg | ORAL_TABLET | Freq: Every day | ORAL | Status: DC

## 2014-05-16 MED ORDER — BENZONATATE 100 MG PO CAPS: 100.0000 mg | ORAL_CAPSULE | Freq: Three times a day (TID) | ORAL | Status: DC | PRN

## 2014-05-16 MED ORDER — ALBUTEROL SULFATE HFA 108 (90 BASE) MCG/ACT IN AERS: 4.0000 | INHALATION_SPRAY | RESPIRATORY_TRACT | Status: DC | PRN

## 2014-05-16 MED ORDER — IPRATROPIUM-ALBUTEROL 0.5-2.5 (3) MG/3ML IN SOLN: 3.0000 mL | Freq: Four times a day (QID) | RESPIRATORY_TRACT | Status: DC

## 2014-05-16 MED ORDER — NICOTINE 21-14-7 MG/24HR TD KIT: 1.0000 | PACK | Freq: Every day | TRANSDERMAL | Status: DC

## 2014-05-16 MED ORDER — MOMETASONE FURO-FORMOTEROL FUM 100-5 MCG/ACT IN AERO: 2.0000 | INHALATION_SPRAY | Freq: Two times a day (BID) | RESPIRATORY_TRACT | Status: DC

## 2014-05-16 NOTE — Discharge Instructions (Signed)
Congratulations on not smoking!  Do not Smoke!   Do not take IBUPROFEN.  It will worsen your breathing.    Follow up with Jerene Pitch, MD On 05/24/2014. (10:45, please bring your medications and insurance information.)     Asthma, Adult Asthma is a recurring condition in which the airways tighten and narrow. Asthma can make it difficult to breathe. It can cause coughing, wheezing, and shortness of breath. Asthma episodes (also called asthma attacks) range from minor to life-threatening. Asthma cannot be cured, but medicines and lifestyle changes can help control it. CAUSES Asthma is believed to be caused by inherited (genetic) and environmental factors, but its exact cause is unknown. Asthma may be triggered by allergens, lung infections, or irritants in the air. Asthma triggers are different for each person. Common triggers include:   Animal dander.  Dust mites.  Cockroaches.  Pollen from trees or grass.  Mold.  Smoke.  Air pollutants such as dust, household cleaners, hair sprays, aerosol sprays, paint fumes, strong chemicals, or strong odors.  Cold air, weather changes, and winds (which increase molds and pollens in the air).  Strong emotional expressions such as crying or laughing hard.  Stress.  Certain medicines (such as aspirin) or types of drugs (such as beta-blockers).  Sulfites in foods and drinks. Foods and drinks that may contain sulfites include dried fruit, potato chips, and sparkling grape juice.  Infections or inflammatory conditions such as the flu, a cold, or an inflammation of the nasal membranes (rhinitis).  Gastroesophageal reflux disease (GERD).  Exercise or strenuous activity. SYMPTOMS Symptoms may occur immediately after asthma is triggered or many hours later. Symptoms include:  Wheezing.  Excessive nighttime or early morning coughing.  Frequent or severe coughing with a common cold.  Chest tightness.  Shortness of breath. DIAGNOSIS    The diagnosis of asthma is made by a review of your medical history and a physical exam. Tests may also be performed. These may include:  Lung function studies. These tests show how much air you breath in and out.  Allergy tests.  Imaging tests such as X-rays. TREATMENT  Asthma cannot be cured, but it can usually be controlled. Treatment involves identifying and avoiding your asthma triggers. It also involves medicines. There are 2 classes of medicine used for asthma treatment:   Controller medicines. These prevent asthma symptoms from occurring. They are usually taken every day.  Reliever or rescue medicines. These quickly relieve asthma symptoms. They are used as needed and provide short-term relief. Your health care provider will help you create an asthma action plan. An asthma action plan is a written plan for managing and treating your asthma attacks. It includes a list of your asthma triggers and how they may be avoided. It also includes information on when medicines should be taken and when their dosage should be changed. An action plan may also involve the use of a device called a peak flow meter. A peak flow meter measures how well the lungs are working. It helps you monitor your condition. HOME CARE INSTRUCTIONS   Take medicine as directed by your health care provider. Speak with your health care provider if you have questions about how or when to take the medicines.  Use a peak flow meter as directed by your health care provider. Record and keep track of readings.  Understand and use the action plan to help minimize or stop an asthma attack without needing to seek medical care.  Control your home environment in the  following ways to help prevent asthma attacks:  Do not smoke. Avoid being exposed to secondhand smoke.  Change your heating and air conditioning filter regularly.  Limit your use of fireplaces and wood stoves.  Get rid of pests (such as roaches and mice) and  their droppings.  Throw away plants if you see mold on them.  Clean your floors and dust regularly. Use unscented cleaning products.  Try to have someone else vacuum for you regularly. Stay out of rooms while they are being vacuumed and for a short while afterward. If you vacuum, use a dust mask from a hardware store, a double-layered or microfilter vacuum cleaner bag, or a vacuum cleaner with a HEPA filter.  Replace carpet with wood, tile, or vinyl flooring. Carpet can trap dander and dust.  Use allergy-proof pillows, mattress covers, and box spring covers.  Wash bed sheets and blankets every week in hot water and dry them in a dryer.  Use blankets that are made of polyester or cotton.  Clean bathrooms and kitchens with bleach. If possible, have someone repaint the walls in these rooms with mold-resistant paint. Keep out of the rooms that are being cleaned and painted.  Wash hands frequently. SEEK MEDICAL CARE IF:   You have wheezing, shortness of breath, or a cough even if taking medicine to prevent attacks.  The colored mucus you cough up (sputum) is thicker than usual.  Your sputum changes from clear or white to yellow, green, gray, or bloody.  You have any problems that may be related to the medicines you are taking (such as a rash, itching, swelling, or trouble breathing).  You are using a reliever medicine more than 2 3 times per week.  Your peak flow is still at 50 79% of you personal best after following your action plan for 1 hour. SEEK IMMEDIATE MEDICAL CARE IF:   You seem to be getting worse and are unresponsive to treatment during an asthma attack.  You are short of breath even at rest.  You get short of breath when doing very little physical activity.  You have difficulty eating, drinking, or talking due to asthma symptoms.  You develop chest pain.  You develop a fast heartbeat.  You have a bluish color to your lips or fingernails.  You are lightheaded,  dizzy, or faint.  Your peak flow is less than 50% of your personal best.  You have a fever or persistent symptoms for more than 2 3 days.  You have a fever and symptoms suddenly get worse. MAKE SURE YOU:   Understand these instructions.  Will watch your condition.  Will get help right away if you are not doing well or get worse. Document Released: 11/29/2005 Document Revised: 08/01/2013 Document Reviewed: 06/28/2013 [facility] Patient Information 2014 [place], [place]. Fibrocystic Breast Changes Fibrocystic breast changes occur when breast ducts become blocked, causing painful, fluid-filled lumps (cysts) to form in the breast. This is a common condition that is noncancerous (benign). It occurs when women go through hormonal changes during their menstrual cycle. Fibrocystic breast changes can affect one or both breasts. CAUSES  The exact cause of fibrocystic breast changes is not known, but it may be related to the female hormones, estrogen and progesterone. Family traits that get passed from parent to child (genetics) may also be a factor in some cases. SIGNS AND SYMPTOMS   Tenderness, mild discomfort, or pain.   Swelling.   Ropelike feeling when touching the breast.   Lumpy breast, one or  both sides.   Changes in breast size, especially before (larger) and after (smaller) the menstrual period.   Green or dark brown nipple discharge (not blood).  Symptoms are usually worse before menstrual periods start and get better toward the end of the menstrual period.  DIAGNOSIS  To make a diagnosis, your health care provider will ask you questions and perform a physical exam of your breasts. The health care provider may recommend other tests that can examine inside your breasts, such as:  A breast X-ray (mammogram).   Ultrasonography.  An MRI.  If something more than fibrocystic breast changes is suspected, your health care provider may take a breast tissue sample (breast  biopsy) to examine. TREATMENT  Often, treatment is not needed. Your health care provider may recommend over-the-counter pain relievers to help lessen pain or discomfort caused by the fibrocystic breast changes. You may also be asked to change your diet to limit or stop eating foods or drinking beverages that contain caffeine. Foods and beverages that contain caffeine include chocolate, soda, coffee, and tea. Reducing sugar and fat in your diet may also help. Your health care provider may also recommend:  Fine needle aspiration to remove fluid from a cyst that is causing pain.   Surgery to remove a large, persistent, and tender cyst. HOME CARE INSTRUCTIONS   Examine your breasts after every menstrual period. If you do not have menstrual periods, check your breasts the first day of every month. Feel for changes, such as more tenderness, a new growth, a change in breast size, or a change in a lump that has always been there.   Only take over-the-counter or prescription medicine as directed by your health care provider.   Wear a well-fitted support or sports bra, especially when exercising.   Decrease or avoid caffeine, fat, and sugar in your diet as directed by your health care provider.  SEEK MEDICAL CARE IF:   You have fluid leaking (discharge) from your nipples, especially bloody discharge.   You have new lumps or bumps in the breast.   Your breast or breasts become enlarged, red, and painful.   You have areas of your breast that pucker in.   Your nipples appear flat or indented.  Document Released: 09/15/2006 Document Revised: 08/01/2013 Document Reviewed: 05/20/2013 [facility] Patient Information 2014 [place].

## 2014-05-16 NOTE — Progress Notes (Signed)
NURSING PROGRESS NOTE  Kathleen [name] [id number] Discharge Data: 05/16/2014 12:40 PM Attending Provider: Jonetta Osgood, MD PCP:No primary provider on file.     Lanell Persons to be D/C'd Home per MD order.  Discussed with the patient the After Visit Summary and all questions fully answered. All IV's discontinued with no bleeding noted. All belongings returned to patient for patient to take home.   Last Vital Signs:  Blood pressure 118/70, pulse 62, temperature 97.9 F (36.6 C), temperature source Oral, resp. rate 15, height '5\' 2"'  (1.575 m), weight 71.215 kg (157 lb), SpO2 97.00%.  Discharge Medication List   Medication List    STOP taking these medications       ibuprofen 200 MG tablet  Commonly known as:  ADVIL,MOTRIN      TAKE these medications       albuterol 108 (90 BASE) MCG/ACT inhaler  Commonly known as:  PROVENTIL HFA;VENTOLIN HFA  Inhale 4 puffs into the lungs every 4 (four) hours as needed for wheezing or shortness of breath.     benzonatate 100 MG capsule  Commonly known as:  TESSALON  Take 1 capsule (100 mg total) by mouth 3 (three) times daily as needed for cough.     HYDROcodone-acetaminophen 5-325 MG per tablet  Commonly known as:  NORCO/VICODIN  Take 1 tablet by mouth every 6 (six) hours as needed for moderate pain.     ipratropium-albuterol 0.5-2.5 (3) MG/3ML Soln  Commonly known as:  DUONEB  Take 3 mLs by nebulization every 6 (six) hours.     levofloxacin 750 MG tablet  Commonly known as:  LEVAQUIN  Take 1 tablet (750 mg total) by mouth daily.     mometasone-formoterol 100-5 MCG/ACT Aero  Commonly known as:  DULERA  Inhale 2 puffs into the lungs 2 (two) times daily.     Nicotine 21-14-7 MG/24HR Kit  Place 1 patch onto the skin daily.     predniSONE 10 MG tablet  Commonly known as:  DELTASONE  Take 6 tablets (60 mg total) by mouth daily. Take 6 tabs with breakfast for 2 days, then take 5 tabs with breakfast for two days, etc.  Drop 10 mg per day  until complete.     promethazine 12.5 MG tablet  Commonly known as:  PHENERGAN  Take 1 tablet (12.5 mg total) by mouth every 6 (six) hours as needed for nausea or vomiting.     triamcinolone 0.025 % ointment  Commonly known as:  KENALOG  Apply 1 application topically 2 (two) times daily.         Wallie Renshaw, RN

## 2014-05-16 NOTE — Discharge Summary (Signed)
Physician Discharge Summary  Kathleen [name] HQP:[id number] DOB: 1961-01-27 DOA: 05/13/2014  PCP: No primary provider on file.  Admit date: 05/13/2014 Discharge date: 05/16/2014  Time spent: 50 minutes  Recommendations for Outpatient Follow-up:  1. Follow up for severe asthma.  2. Patient being discharged at her own request. 3. Check BMET (recent acute kidney injury) 4. Follow up breast nodules-please schedule for outpatient Mammography  Discharge Diagnoses:  Principal Problem:   Acute respiratory failure Active Problems:   Asthma exacerbation   AKI (acute kidney injury)   Protein-calorie malnutrition, severe   Discharge Condition: stable  Diet recommendation: regular  Filed Weights   05/13/14 1347 05/13/14 1850  Weight: 76.204 kg (168 lb) 71.215 kg (157 lb)    History of present illness:  35 female with history of intubation for severe asthma presented to [facility] ER with 3 days of SOB.  She was given steroids and discharged.  She returned with worsening of symptoms.  She currently does not have a PCP or any asthma maintenance medications at home.  She was a previously heavy smoker.  She reports she quit smoking recently.  Hospital Course:   Asthma exacerbation  -improved but still with significant wheeze.  Patient requests discharge. -admitted and started on IV Solumedrol, nebs,added Levaquin on 6/3.  -Moving air -but still wheezing. Incentive Spirometry and Flutter valve. Improved, and requesting discharge today, does not want to remain hospitalized further. Claims that she can manage at home with prednisone, and albuterol nebs -Will discharge patient on Prednisone taper, Levaquin, Duoneb nebulizer, dulera inhaler with albuterol rescue as well. -Will attempt to assist her with obtaining a PCP appointment prior to D/C  Acute Hypoxic respiratory failure  -secondary to above  -resolved   AKI (acute kidney injury)  -Likely secondary to dehydration and NSAID use.  -advised the  patient not to take Ibuprofen.  -IVF reduced creatinine. -resolved   Protein-calorie malnutrition, severe  -in the context of chronic illness   Breast nodules -Patient mentioned breast nodules. -Will need outpatient mammogram.  Tobacco Abuse -counseled extensively regarding importance of quitting.     Discharge Exam: Filed Vitals:   05/16/14 0514  BP: 118/70  Pulse: 62  Temp: 97.9 F (36.6 C)  Resp: 15   Gen Exam: Awake and alert with clear speech. Sitting up ready for discharge.  Boyfriend at bedside. Neck: Supple, No JVD.  Chest:Good air movement bilaterally with  expiratory wheeze. CVS: S1 S2 Regular, no murmurs.  Abdomen: soft, BS +, non tender, non distended.  Extremities: no edema, lower extremities warm to touch.  Neurologic: Non Focal.    Discharge Instructions       Discharge Instructions   Diet - low sodium heart healthy    Complete by:  As directed      Increase activity slowly    Complete by:  As directed             Medication List    STOP taking these medications       ibuprofen 200 MG tablet  Commonly known as:  ADVIL,MOTRIN      TAKE these medications       albuterol 108 (90 BASE) MCG/ACT inhaler  Commonly known as:  PROVENTIL HFA;VENTOLIN HFA  Inhale 4 puffs into the lungs every 4 (four) hours as needed for wheezing or shortness of breath.     benzonatate 100 MG capsule  Commonly known as:  TESSALON  Take 1 capsule (100 mg total) by mouth 3 (three) times daily as  needed for cough.     HYDROcodone-acetaminophen 5-325 MG per tablet  Commonly known as:  NORCO/VICODIN  Take 1 tablet by mouth every 6 (six) hours as needed for moderate pain.     ipratropium-albuterol 0.5-2.5 (3) MG/3ML Soln  Commonly known as:  DUONEB  Take 3 mLs by nebulization every 6 (six) hours.     levofloxacin 750 MG tablet  Commonly known as:  LEVAQUIN  Take 1 tablet (750 mg total) by mouth daily.     mometasone-formoterol 100-5 MCG/ACT Aero  Commonly  known as:  DULERA  Inhale 2 puffs into the lungs 2 (two) times daily.     Nicotine 21-14-7 MG/24HR Kit  Place 1 patch onto the skin daily.     predniSONE 10 MG tablet  Commonly known as:  DELTASONE  Take 6 tablets (60 mg total) by mouth daily. Take 6 tabs with breakfast for 2 days, then take 5 tabs with breakfast for two days, etc.  Drop 10 mg per day until complete.     promethazine 12.5 MG tablet  Commonly known as:  PHENERGAN  Take 1 tablet (12.5 mg total) by mouth every 6 (six) hours as needed for nausea or vomiting.     triamcinolone 0.025 % ointment  Commonly known as:  KENALOG  Apply 1 application topically 2 (two) times daily.       Allergies  Allergen Reactions  . Tramadol Nausea Only   Follow-up Information      Contact information:         Follow up with [facility], [place]. (For follow-up of you breast nodules)    Specialty:  Breast Surgery   Contact information:   [address] [place] [address] [phone]      Follow up with Jerene Pitch, MD On 05/24/2014. (10:45, please bring your medications and insurance information.)    Specialty:  Internal Medicine   Contact information:   [place] [address] [phone]        The results of significant diagnostics from this hospitalization (including imaging, microbiology, ancillary and laboratory) are listed below for reference.    Significant Diagnostic Studies: Dg Chest Portable 1 View  05/13/2014   CLINICAL DATA:  Shortness of breath, asthma.  EXAM: PORTABLE CHEST - 1 VIEW  COMPARISON:  April 08, 2014.  FINDINGS: The heart size and mediastinal contours are within normal limits. Both lungs are clear. No pneumothorax or pleural effusion is noted. The visualized skeletal structures are unremarkable.  IMPRESSION: No acute cardiopulmonary abnormality seen.   Electronically Signed   By: Sabino Dick M.D.   On: 05/13/2014 14:42      Labs: Basic Metabolic  Panel:  Recent Labs Lab 05/13/14 1410 05/14/14 0640  NA 139 138  K 4.0 4.0  CL 103 102  CO2 21 23  GLUCOSE 101* 113*  BUN 13 10  CREATININE 1.24* 0.77  CALCIUM 9.1 9.1   Liver Function Tests: CBC:  Recent Labs Lab 05/13/14 1410  WBC 8.4  NEUTROABS 3.7  HGB 13.8  HCT 40.1  MCV 85.0  PLT 312   CBG:  Recent Labs Lab 05/15/14 0805  GLUCAP 106*       Signed:  Karen Kitchens [phone]  [facility] 05/16/2014, 10:48 AM  Attending Patient was seen, examined,treatment plan was discussed with the Physician extender. I have directly reviewed the clinical findings, lab, imaging studies and management of this patient in detail. I have made the  necessary changes to the above noted documentation, and agree with the documentation, as recorded by the Physician extender.  Nena Alexander MD [facility].

## 2014-05-24 ENCOUNTER — Encounter: Payer: Self-pay | Admitting: Internal Medicine

## 2014-05-24 ENCOUNTER — Other Ambulatory Visit: Payer: Self-pay | Admitting: Internal Medicine

## 2014-05-24 ENCOUNTER — Ambulatory Visit (INDEPENDENT_AMBULATORY_CARE_PROVIDER_SITE_OTHER): Payer: No Typology Code available for payment source | Admitting: Internal Medicine

## 2014-05-24 VITALS — BP 100/64 | HR 84 | Temp 98.7°F | Resp 20 | Ht 62.5 in | Wt 163.1 lb

## 2014-05-24 DIAGNOSIS — N179 Acute kidney failure, unspecified: Secondary | ICD-10-CM

## 2014-05-24 DIAGNOSIS — J45909 Unspecified asthma, uncomplicated: Secondary | ICD-10-CM

## 2014-05-24 DIAGNOSIS — Z720 Tobacco use: Secondary | ICD-10-CM | POA: Insufficient documentation

## 2014-05-24 DIAGNOSIS — J45901 Unspecified asthma with (acute) exacerbation: Secondary | ICD-10-CM

## 2014-05-24 DIAGNOSIS — N63 Unspecified lump in unspecified breast: Secondary | ICD-10-CM

## 2014-05-24 DIAGNOSIS — N920 Excessive and frequent menstruation with regular cycle: Secondary | ICD-10-CM | POA: Insufficient documentation

## 2014-05-24 DIAGNOSIS — IMO0002 Reserved for concepts with insufficient information to code with codable children: Secondary | ICD-10-CM | POA: Insufficient documentation

## 2014-05-24 DIAGNOSIS — F17200 Nicotine dependence, unspecified, uncomplicated: Secondary | ICD-10-CM

## 2014-05-24 LAB — CBC WITH DIFFERENTIAL/PLATELET
BASOS PCT: 0 % (ref 0–1)
Basophils Absolute: 0 10*3/uL (ref 0.0–0.1)
EOS PCT: 1 % (ref 0–5)
Eosinophils Absolute: 0.1 10*3/uL (ref 0.0–0.7)
HCT: 41.8 % (ref 36.0–46.0)
Hemoglobin: 13.7 g/dL (ref 12.0–15.0)
LYMPHS ABS: 5 10*3/uL — AB (ref 0.7–4.0)
Lymphocytes Relative: 45 % (ref 12–46)
MCH: 27.5 pg (ref 26.0–34.0)
MCHC: 32.8 g/dL (ref 30.0–36.0)
MCV: 83.9 fL (ref 78.0–100.0)
Monocytes Absolute: 1 10*3/uL (ref 0.1–1.0)
Monocytes Relative: 9 % (ref 3–12)
Neutro Abs: 5 10*3/uL (ref 1.7–7.7)
Neutrophils Relative %: 45 % (ref 43–77)
Platelets: 314 10*3/uL (ref 150–400)
RBC: 4.98 MIL/uL (ref 3.87–5.11)
RDW: 14.6 % (ref 11.5–15.5)
WBC: 11 10*3/uL — ABNORMAL HIGH (ref 4.0–10.5)

## 2014-05-24 LAB — POCT URINALYSIS DIPSTICK
Bilirubin, UA: NEGATIVE
Glucose, UA: NEGATIVE
Ketones, UA: NEGATIVE
Leukocytes, UA: NEGATIVE
Nitrite, UA: NEGATIVE
PH UA: 6
Protein, UA: NEGATIVE
SPEC GRAV UA: 1.015
Urobilinogen, UA: 0.2

## 2014-05-24 LAB — POCT URINE PREGNANCY: Preg Test, Ur: NEGATIVE

## 2014-05-24 MED ORDER — IPRATROPIUM-ALBUTEROL 0.5-2.5 (3) MG/3ML IN SOLN: 3.0000 mL | Freq: Four times a day (QID) | RESPIRATORY_TRACT | Status: DC

## 2014-05-24 NOTE — Assessment & Plan Note (Signed)
Long term smoker, claims to have quit 2010 but continues to smoke off and on since then, especially after meals. Last cigarette Sunday. Trying to quit, cannot afford patches. Discussed cessation in detail today. Recommended quitline  -call 1800quitnow, information material given and reviewed with patient along with common trigger forms as well

## 2014-05-24 NOTE — Assessment & Plan Note (Signed)
Self reported several months on self exam. Endorsed 2 lumps on right and 3 on L, however, unable to palpate any on herself today on left. Tenderness to palpation on b/l breasts and axillary region today on exam. No obvious masses palpated but possible raised/glandular region upper R breast? Reports hx of breast biopsy and loss to mammogram follow up since 2008-2009. Claims to have been followed at [facility] hospital.   Unclear etiology--possibly [facility] however, concern for possible malignancy present especially given menorrhagia at this time.  -needs records -diagnostic b/l mammogram ordered today -GYN referral (office at [facility] hospital closed today, RN will call Monday for an appointment)

## 2014-05-24 NOTE — Assessment & Plan Note (Signed)
Reported with sexual intercourse with long term monagamous partner and now reports being currently on heavy menses since Saturday  -urgent GYN referral

## 2014-05-24 NOTE — Progress Notes (Signed)
 Subjective:   Patient ID: Kathleen [name] female   DOB: 04/18/1961 52 y.o.   MRN: [id number]  HPI: Kathleen [name] is a 52 y.o. African American current smoker female with asthma presenting to opc today for hospital follow up visit.  She was recently admitted to the hospital on 6/1-6/4 for acute respiratory failure 2/2 asthma exacerbation. She was discharged on her own request, though still noted to have wheezing at time of discharge.  Discharge medications included course of levaquin which she completed, long prednisone taper which she is now on 30mg x2 days and has to do 20mg x2 days and then 10mg until finished, dulera, and duonebz. She also had AKI which resolved.    Since discharge: Asthma stable--no SOB, no wheezing. Compliant with above medications but cannot afford dulera with current prescription and needs more nebulizers (has 4 bottles left), not requring nebz or rescue inhaler very frequently anymore. Still on prednisone taper. Cannot afford nicotine patches, still smoking occasionally with meals, trying to stop. Possibly exacerbated with fumes at work and persistent smoking.  AKI--resolved at discharged. Requested to get bmet on follow up. Will get today  ?breakthrough bleeding--reports being on her period since Saturday after hospital admission. Last period ~9 months ago, age 52. Heave menstrual flow with initial cramping, uses up to 9 pads a day that are soaked. Reports pain with sexual intercourse. In monogamous relationship. Reportedly has vaginal mesh in place but does not know why. [facility] Will need records for pcp.   Breast lumps--says 2 on right and 3 on left, although unable to palpate any today, painful, changing position. Reports having a biopsy done in the past with last mammogram several years ago maybe in 2008 or 2009. Significant family history of cancer with aunt (maternal) that died from breast cancer, diagnosed age 56, mom had  colon cancer died at age 84, and dad had lung and prostate cancer. Would like referral for mammogram.   Back spasms--occasionally, left sided, sore from coughing during asthma attack. Norco given on discharge helps.   Past Medical History  Diagnosis Date  . Asthma   . Bronchitis    Current Outpatient Prescriptions  Medication Sig Dispense Refill  . albuterol (PROVENTIL HFA;VENTOLIN HFA) 108 (90 BASE) MCG/ACT inhaler Inhale 4 puffs into the lungs every 4 (four) hours as needed for wheezing or shortness of breath.  3 Inhaler  3  . benzonatate (TESSALON) 100 MG capsule Take 1 capsule (100 mg total) by mouth 3 (three) times daily as needed for cough.  20 capsule  0  . HYDROcodone-acetaminophen (NORCO/VICODIN) 5-325 MG per tablet Take 1 tablet by mouth every 6 (six) hours as needed for moderate pain.  30 tablet  0  . ipratropium-albuterol (DUONEB) 0.5-2.5 (3) MG/3ML SOLN Take 3 mLs by nebulization every 6 (six) hours.  360 mL  12  . levofloxacin (LEVAQUIN) 750 MG tablet Take 1 tablet (750 mg total) by mouth daily.  5 tablet  0  . mometasone-formoterol (DULERA) 100-5 MCG/ACT AERO Inhale 2 puffs into the lungs 2 (two) times daily.  3 Inhaler  3  . Nicotine 21-14-7 MG/24HR KIT Place 1 patch onto the skin daily.  21 each  0  . predniSONE (DELTASONE) 10 MG tablet Take 6 tablets (60 mg total) by mouth daily. Take 6 tabs with breakfast for 2 days, then take 5 tabs with breakfast for two days, etc.  Drop 10 mg per day until complete.  50   tablet  0  . promethazine (PHENERGAN) 12.5 MG tablet Take 1 tablet (12.5 mg total) by mouth every 6 (six) hours as needed for nausea or vomiting.  30 tablet  0  . triamcinolone (KENALOG) 0.025 % ointment Apply 1 application topically 2 (two) times daily.  30 g  0   No current facility-administered medications for this visit.   Family History  Problem Relation Age of Onset  . Cancer - Colon Mother   . Cancer - Prostate Father    History   Social History  . Marital  Status: Married    Spouse Name: N/A    Number of Children: N/A  . Years of Education: N/A   Social History Main Topics  . Smoking status: Former Smoker    Quit date: 05/24/2009  . Smokeless tobacco: None  . Alcohol Use: No  . Drug Use: No  . Sexual Activity: Not Currently   Other Topics Concern  . None   Social History Narrative  . None   Review of Systems:  Constitutional:  Denies fever, chills  HEENT:  Denies congestion  Respiratory:  Denies SOB and wheezing.   Cardiovascular:  Denies chest pain  Gastrointestinal:  Denies nausea, vomiting  Genitourinary:  Hematuria and on menses, dyspareunia   Musculoskeletal:  Lower back spasms  Skin:  Denies pallor, rash and wound.    Objective:  Physical Exam: Filed Vitals:   05/24/14 1047  BP: 100/64  Pulse: 84  Temp: 98.7 F (37.1 C)  TempSrc: Oral  Resp: 20  Height: 5' 2.5" (1.588 m)  Weight: 163 lb 1.6 oz (73.982 kg)  SpO2: 98%   Vitals reviewed. General: sitting on examination table, NAD HEENT: EOMI Breast: tenderness to palpation of breasts R>L, mild raised area on upper portion of breat but not obvious palpable mass, mild tenderness to palpation on axillary exam Cardiac: RRR, no rubs, murmurs or gallops Pulm: clear to auscultation bilaterally, no wheezes, rales, or rhonchi Abd: soft, nontender, nondistended, BS present Ext: warm and well perfused, no pedal edema Neuro: alert and oriented X3,strength and sensation to light touch equal in bilateral upper and lower extremities  Assessment & Plan:  Discussed with Dr. Mullen Needs to establish with pcp office Asthma--stable since d/c. Cannot afford dulera. Will need more affordable option Menorrhagia--stat GYN referral, cbc today Breast lumps--diagnostic b/l mammo ordered   

## 2014-05-24 NOTE — Assessment & Plan Note (Addendum)
Reports LMP ~9 months ago, started on Saturday after hospital discharge. Was on lovenox during admission she claims. Heavy, soaking ~9 pads a day, cramping initially. Also reports dyspareunia. In monogamous long term relationship. Urine preg neg, u/a +blood. Obvious concern for malignancy in patient who is in her 35s with significant bleeding and dyspareunia and family hx of cancer.   -urgent GYN referral for first available appointment (closed today, RN will call Monday for appt and let patient know) -check cbc given significant reported bleeding -advised patient if symptomatic bleeding, sob, chest pain, near syncope or syncope or continued heavy bleeding to go to ER

## 2014-05-24 NOTE — Assessment & Plan Note (Signed)
Resolved at discharge from 1.24 to 0.77.  -repeat bmet requested on discharge follow up, ordered today

## 2014-05-24 NOTE — Assessment & Plan Note (Signed)
Now resolved s/p recent hospitalization. Completed levaquin course and on prednisone taper at this time along with dulera and nebz prn. Cannot afford dulera  -refilled duonebz by giving written prescription that she can try to take to [facility] where they may be more affordable -will need to see what is on formulary for her insurance in place of dulera so she can afford it -finish prednisone taper -smoking cessation strongly recommended

## 2014-05-24 NOTE — Patient Instructions (Addendum)
General Instructions:  Dear [name],   We will call you with results of your exam.  We will call you with the GYN referral appt, please go there as soon as possible  Please get your diagnostic mammogram done  We will try to find an inhaler that will work best with your insurance and get back to you  Complete your predinsone taper and continue dulera  If your bleeding gets worse or does not improve let us know [phone] or if severe or you are feeling light-headed, faint or near fainting, chest pain, sob, go directly to the emergency room  Please stop smoking [id number]  I have given you the nebulizer prescription, try taking to [place] where it is likely cheapest.   Thank you for bringing your medicines today. This helps [place] keep you safe from mistakes.   Progress Toward Treatment Goals:  No flowsheet data found.  Self Care Goals & Plans:  No flowsheet data found.  No flowsheet data found.   Care Management & Community Referrals:  No flowsheet data found.    Menorrhagia Menorrhagia is when your menstrual periods are heavy or last longer than usual.  HOME CARE  Only take medicine as told by your doctor.  Take any iron pills as told by your doctor. Heavy bleeding may cause low levels of iron in your body.  Do not take aspirin 1 week before or during your period. Aspirin can make the bleeding worse.  Lie down for a while if you change your tampon or pad more than once in 2 hours. This may help lessen the bleeding.  Eat a healthy diet and foods with iron. These foods include leafy green vegetables, meat, liver, eggs, and whole grain breads and cereals.  Do not try to lose weight. Wait until the heavy bleeding has stopped and your iron level is normal. GET HELP IF:  You soak through a pad or tampon every 1 or 2 hours, and this happens every time you have a period.  You need to use pads and tampons at the same time because you are bleeding so much.  You need  to change your pad or tampon during the night.  You have a period that lasts for more than 8 days.  You pass clots bigger than 1 inch (2.5 cm) wide.  You have irregular periods that happen more or less often than once a month.  You feel dizzy or pass out (faint).  You feel very weak or tired.  You feel short of breath or feel your heart is beating too fast when you exercise.  You feel sick to your stomach (nausea) and you throw up (vomit) while you are taking your medicine.   You have watery poop (diarrhea) while you are taking your medicine.  You have any problems that may be related to the medicine you are taking.  GET HELP RIGHT AWAY IF:  You soak through 4 or more pads or tampons in 2 hours.  You have any bleeding while you are pregnant. MAKE SURE YOU:   Understand these instructions.  Will watch your condition.  Will get help right away if you are not doing well or get worse. Document Released: 09/07/2008 Document Revised: 08/01/2013 Document Reviewed: 05/31/2013 [facility] Patient Information 2014 [place], [place].

## 2014-05-25 LAB — URINALYSIS, MICROSCOPIC ONLY
BACTERIA UA: NONE SEEN
Casts: NONE SEEN
Crystals: NONE SEEN
Squamous Epithelial / LPF: NONE SEEN

## 2014-05-25 LAB — URINALYSIS, ROUTINE W REFLEX MICROSCOPIC
Bilirubin Urine: NEGATIVE
Glucose, UA: NEGATIVE mg/dL
KETONES UR: NEGATIVE mg/dL
LEUKOCYTES UA: NEGATIVE
NITRITE: NEGATIVE
PH: 6 (ref 5.0–8.0)
Protein, ur: NEGATIVE mg/dL
Specific Gravity, Urine: 1.013 (ref 1.005–1.030)
Urobilinogen, UA: 0.2 mg/dL (ref 0.0–1.0)

## 2014-05-25 LAB — BASIC METABOLIC PANEL WITH GFR
BUN: 16 mg/dL (ref 6–23)
CO2: 31 meq/L (ref 19–32)
Calcium: 8.8 mg/dL (ref 8.4–10.5)
Chloride: 99 mEq/L (ref 96–112)
Creat: 1.01 mg/dL (ref 0.50–1.10)
GFR, Est African American: 73 mL/min
GFR, Est Non African American: 64 mL/min
Glucose, Bld: 69 mg/dL — ABNORMAL LOW (ref 70–99)
Potassium: 3.8 mEq/L (ref 3.5–5.3)
Sodium: 139 mEq/L (ref 135–145)

## 2014-05-27 NOTE — Progress Notes (Signed)
Case discussed with Dr. Qureshi at the time of the visit.  We reviewed the resident's history and exam and pertinent patient test results.  I agree with the assessment, diagnosis, and plan of care documented in the resident's note. 

## 2014-05-29 ENCOUNTER — Encounter: Payer: No Typology Code available for payment source | Admitting: Obstetrics and Gynecology

## 2014-06-10 ENCOUNTER — Ambulatory Visit
Admission: RE | Admit: 2014-06-10 | Discharge: 2014-06-10 | Disposition: A | Discharge status: Home / Self Care | Payer: No Typology Code available for payment source | Source: Ambulatory Visit | Attending: Internal Medicine | Admitting: Internal Medicine

## 2014-06-10 ENCOUNTER — Encounter (INDEPENDENT_AMBULATORY_CARE_PROVIDER_SITE_OTHER): Payer: Self-pay

## 2014-06-10 DIAGNOSIS — N63 Unspecified lump in unspecified breast: Secondary | ICD-10-CM

## 2014-06-17 NOTE — Addendum Note (Signed)
Addended by: Hulan Fray on: 06/17/2014 07:24 PM   Modules accepted: Orders

## 2014-06-18 ENCOUNTER — Encounter: Payer: Self-pay | Admitting: Internal Medicine

## 2014-06-18 ENCOUNTER — Ambulatory Visit (HOSPITAL_COMMUNITY)
Admission: RE | Admit: 2014-06-18 | Discharge: 2014-06-18 | Disposition: A | Discharge status: Home / Self Care | Payer: No Typology Code available for payment source | Source: Ambulatory Visit | Attending: Internal Medicine | Admitting: Internal Medicine

## 2014-06-18 ENCOUNTER — Ambulatory Visit (INDEPENDENT_AMBULATORY_CARE_PROVIDER_SITE_OTHER): Payer: No Typology Code available for payment source | Admitting: Internal Medicine

## 2014-06-18 VITALS — BP 107/79 | HR 68 | Temp 97.1°F | Wt 162.9 lb

## 2014-06-18 DIAGNOSIS — M25569 Pain in unspecified knee: Secondary | ICD-10-CM | POA: Insufficient documentation

## 2014-06-18 DIAGNOSIS — Z0000 Encounter for general adult medical examination without abnormal findings: Secondary | ICD-10-CM | POA: Insufficient documentation

## 2014-06-18 DIAGNOSIS — M25562 Pain in left knee: Secondary | ICD-10-CM | POA: Insufficient documentation

## 2014-06-18 DIAGNOSIS — Z1239 Encounter for other screening for malignant neoplasm of breast: Secondary | ICD-10-CM

## 2014-06-18 MED ORDER — NAPROXEN 375 MG PO TABS: 375.0000 mg | ORAL_TABLET | Freq: Two times a day (BID) | ORAL | Status: DC

## 2014-06-18 NOTE — Patient Instructions (Addendum)
Take Naproxen as recommended. Take Tylenol 350 mg 1-2 tablets every 8 hours as needed for pain, if the Naproxen does not help.  Osteoarthritis Osteoarthritis is a disease that causes soreness and swelling (inflammation) of a joint. It occurs when the cartilage at the affected joint wears down. Cartilage acts as a cushion, covering the ends of bones where they meet to form a joint. Osteoarthritis is the most common form of arthritis. It often occurs in older people. The joints affected most often by this condition include those in the:  Ends of the fingers.  Thumbs.  Neck.  Lower back.  Knees.  Hips. CAUSES  Over time, the cartilage that covers the ends of bones begins to wear away. This causes bone to rub on bone, producing pain and stiffness in the affected joints.  RISK FACTORS Certain factors can increase your chances of having osteoarthritis, including:  Older age.  Excessive body weight.  Overuse of joints. SIGNS AND SYMPTOMS   Pain, swelling, and stiffness in the joint.  Over time, the joint may lose its normal shape.  Small deposits of bone (osteophytes) may grow on the edges of the joint.  Bits of bone or cartilage can break off and float inside the joint space. This may cause more pain and damage. DIAGNOSIS  Your health care provider will do a physical exam and ask about your symptoms. Various tests may be ordered, such as:  X-rays of the affected joint.  An MRI scan.  Blood tests to rule out other types of arthritis.  Joint fluid tests. This involves using a needle to draw fluid from the joint and examining the fluid under a microscope. TREATMENT  Goals of treatment are to control pain and improve joint function. Treatment plans may include:  A prescribed exercise program that allows for rest and joint relief.  A weight control plan.  Pain relief techniques, such as:  Properly applied heat and cold.  Electric pulses delivered to nerve endings under  the skin (transcutaneous electrical nerve stimulation, TENS).  Massage.  Certain nutritional supplements.  Medicines to control pain, such as:  Acetaminophen.  Nonsteroidal anti-inflammatory drugs (NSAIDs), such as naproxen.  Narcotic or central-acting agents, such as tramadol.  Corticosteroids. These can be given orally or as an injection.  Surgery to reposition the bones and relieve pain (osteotomy) or to remove loose pieces of bone and cartilage. Joint replacement may be needed in advanced states of osteoarthritis. HOME CARE INSTRUCTIONS   Only take over-the-counter or prescription medicines as directed by your health care provider. Take all medicines exactly as instructed.  Maintain a healthy weight. Follow your health care provider's instructions for weight control. This may include dietary instructions.  Exercise as directed. Your health care provider can recommend specific types of exercise. These may include:  Strengthening exercises--These are done to strengthen the muscles that support joints affected by arthritis. They can be performed with weights or with exercise bands to add resistance.  Aerobic activities--These are exercises, such as brisk walking or low-impact aerobics, that get your heart pumping.  Range-of-motion activities--These keep your joints limber.  Balance and agility exercises--These help you maintain daily living skills.  Rest your affected joints as directed by your health care provider.  Follow up with your health care provider as directed. SEEK MEDICAL CARE IF:   Your skin turns red.  You develop a rash in addition to your joint pain.  You have worsening joint pain. SEEK IMMEDIATE MEDICAL CARE IF:  You have a  significant loss of weight or appetite.  You have a fever along with joint or muscle aches.  You have night sweats. [place] of Arthritis and Musculoskeletal and Skin Diseases:  www.niams.SouthExposed.es [employer] on Aging: http://kim-miller.com/ American College of Rheumatology: www.rheumatology.org Document Released: 11/29/2005 Document Revised: 09/19/2013 Document Reviewed: 08/06/2013 [facility] Patient Information 2015 [place], [place]. This information is not intended to replace advice given to you by your health care provider. Make sure you discuss any questions you have with your health care provider.

## 2014-06-18 NOTE — Assessment & Plan Note (Signed)
Patient declined pneumococcal vaccine and stated she would think about it. GI referral for a Screening colonoscopy was ordered. Patient had an Mammogram in June 2015 which was normal. Recommended annual mammogram.

## 2014-06-18 NOTE — Assessment & Plan Note (Signed)
Suspect secondary to Osteoarthritis vs MCL/LCL Ligament strain. Discussed with the attending regarding further management.  Plans: X-ray left knee to evaluate further. Naproxen 375 mg po bid + Tylenol as needed Warm/cool compresses. Follow up in 2 weeks.

## 2014-06-18 NOTE — Progress Notes (Signed)
Subjective:   Patient ID: Kathleen [name] female   DOB: May 16, 1961 53 y.o.   MRN: [id number]  HPI: [name] is a 53 y.o. woman with PMH significant for Bronchial asthma comes to the office with CC of left knee pain x one week.  Patient reports that she noticed pain in her left knee pain about a week ago, insidious in onset, no history of trauma. Pain is about 5/10 in severity, aggravated by walking or movement in the left knee, relieved by resting and aleve. Patient reports that she has been taking aleve with partial relief. 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  She denies any other complaints.  Past Medical History  Diagnosis Date  . Asthma   . Bronchitis    Current Outpatient Prescriptions  Medication Sig Dispense Refill  . albuterol (PROVENTIL HFA;VENTOLIN HFA) 108 (90 BASE) MCG/ACT inhaler Inhale 4 puffs into the lungs every 4 (four) hours as needed for wheezing or shortness of breath.  3 Inhaler  3  . ipratropium-albuterol (DUONEB) 0.5-2.5 (3) MG/3ML SOLN Take 3 mLs by nebulization every 6 (six) hours.  360 mL  12  . mometasone-formoterol (DULERA) 100-5 MCG/ACT AERO Inhale 2 puffs into the lungs 2 (two) times daily.  3 Inhaler  3  . Nicotine 21-14-7 MG/24HR KIT Place 1 patch onto the skin daily.  21 each  0  . triamcinolone (KENALOG) 0.025 % ointment Apply 1 application topically 2 (two) times daily.  30 g  0   No current facility-administered medications for this visit.   Family History  Problem Relation Age of Onset  . Cancer - Colon Mother   . Cancer - Prostate Father    History   Social History  . Marital Status: Married    Spouse Name: N/A    Number of Children: N/A  . Years of Education: N/A   Social History Main Topics  . Smoking status: Former Smoker    Types: Cigarettes  . Smokeless tobacco: Not on file     Comment:  smokes with meals, last one on sunday   . Alcohol Use: No  . Drug Use: No  . Sexual Activity: Yes    Birth Control/ Protection: None     Comment: monogamous   Other Topics Concern  . Not on file   Social History Narrative  . No narrative on file   Review of Systems: Pertinent items are noted in HPI. Objective:  Physical Exam: Filed Vitals:   06/18/14 1354  BP: 107/79  Pulse: 68  Temp: 97.1 F (36.2 C)  TempSrc: Oral  Weight: 162 lb 14.4 oz (73.891 kg)  SpO2: 100%   Constitutional: Vital signs reviewed.  Patient is a well-developed and well-nourished and is in no acute distress and cooperative with exam.  Cardiovascular: RRR, S1 normal, S2 normal, no MRG Pulmonary/Chest: normal respiratory effort, CTAB, no wheezes, rales, or rhonchi Left knee: No visible swelling or erythema noted over the left knee. There is increased warmth felt. There is no supra-patellar fullness noted. There is no tenderness over the patella, superior/inferior pole of the patella.There is mild tenderness to palpation over the medial and lateral joint line. Anterior/posterior drawer tests negative. McMurray test is negative. Full flexion and extension are possible but slightly limited by pain.  Neurological: A&O x3, Strength is normal and symmetric bilaterally Skin:  Warm, dry and intact.  Psychiatric: Normal mood and affect.   Assessment & Plan:

## 2014-06-19 ENCOUNTER — Other Ambulatory Visit: Payer: Self-pay | Admitting: *Deleted

## 2014-06-19 DIAGNOSIS — J45901 Unspecified asthma with (acute) exacerbation: Secondary | ICD-10-CM

## 2014-06-19 MED ORDER — IPRATROPIUM-ALBUTEROL 0.5-2.5 (3) MG/3ML IN SOLN: 3.0000 mL | Freq: Four times a day (QID) | RESPIRATORY_TRACT | Status: DC

## 2014-06-19 NOTE — Progress Notes (Signed)
Case discussed with Dr. Boggala at the time of the visit.  We reviewed the resident's history and exam and pertinent patient test results.  I agree with the assessment, diagnosis, and plan of care documented in the resident's note. 

## 2014-06-20 ENCOUNTER — Encounter: Payer: Self-pay | Admitting: Internal Medicine

## 2014-06-21 ENCOUNTER — Telehealth: Payer: Self-pay | Admitting: *Deleted

## 2014-06-21 NOTE — Telephone Encounter (Signed)
You sent in a Rx for duoneb yesterday but it cost $250.  Pt can't afford and would like a Rx for Albuterol neb. Will you change for her?

## 2014-06-24 ENCOUNTER — Other Ambulatory Visit: Payer: Self-pay | Admitting: Internal Medicine

## 2014-06-24 DIAGNOSIS — J45901 Unspecified asthma with (acute) exacerbation: Secondary | ICD-10-CM

## 2014-06-24 MED ORDER — ALBUTEROL SULFATE HFA 108 (90 BASE) MCG/ACT IN AERS: 4.0000 | INHALATION_SPRAY | RESPIRATORY_TRACT | Status: DC | PRN

## 2014-06-24 NOTE — Telephone Encounter (Signed)
Treatment sent to the pharmacy.

## 2014-07-17 ENCOUNTER — Ambulatory Visit (AMBULATORY_SURGERY_CENTER): Payer: No Typology Code available for payment source | Admitting: *Deleted

## 2014-07-17 ENCOUNTER — Other Ambulatory Visit: Payer: Self-pay | Admitting: Internal Medicine

## 2014-07-17 VITALS — Ht 62.5 in | Wt 162.4 lb

## 2014-07-17 DIAGNOSIS — Z1211 Encounter for screening for malignant neoplasm of colon: Secondary | ICD-10-CM

## 2014-07-17 MED ORDER — MOVIPREP 100 G PO SOLR: ORAL | Status: DC

## 2014-07-17 NOTE — Progress Notes (Signed)
Patient denies any allergies to eggs or soy. Patient denies any problems with anesthesia/sedation. Patient denies any oxygen use at home and does not take any diet/weight loss medications. Instructions given to patient to bring inhaler with her. No computer access per patient.

## 2014-07-18 NOTE — Telephone Encounter (Signed)
It was an electronic request but pt could have generated it by calling the vmail at the pharmacy and computer sending the request

## 2014-07-18 NOTE — Telephone Encounter (Signed)
Called and lm for pt to call clinic triage

## 2014-07-24 ENCOUNTER — Encounter: Payer: Self-pay | Admitting: Obstetrics and Gynecology

## 2014-07-24 ENCOUNTER — Ambulatory Visit (INDEPENDENT_AMBULATORY_CARE_PROVIDER_SITE_OTHER): Payer: Self-pay | Admitting: Obstetrics and Gynecology

## 2014-07-24 VITALS — BP 121/82 | HR 66 | Temp 97.7°F | Resp 20 | Ht 62.0 in | Wt 163.0 lb

## 2014-07-24 DIAGNOSIS — Z01812 Encounter for preprocedural laboratory examination: Secondary | ICD-10-CM

## 2014-07-24 DIAGNOSIS — N882 Stricture and stenosis of cervix uteri: Secondary | ICD-10-CM

## 2014-07-24 DIAGNOSIS — M25569 Pain in unspecified knee: Secondary | ICD-10-CM

## 2014-07-24 DIAGNOSIS — N939 Abnormal uterine and vaginal bleeding, unspecified: Secondary | ICD-10-CM

## 2014-07-24 DIAGNOSIS — M25562 Pain in left knee: Secondary | ICD-10-CM

## 2014-07-24 DIAGNOSIS — N926 Irregular menstruation, unspecified: Secondary | ICD-10-CM

## 2014-07-24 LAB — POCT PREGNANCY, URINE
PREG TEST UR: NEGATIVE
PREG TEST UR: NEGATIVE

## 2014-07-24 MED ORDER — NAPROXEN 375 MG PO TABS: 375.0000 mg | ORAL_TABLET | Freq: Two times a day (BID) | ORAL | Status: DC

## 2014-07-24 NOTE — Addendum Note (Signed)
Addended by: Mora Bellman on: 07/24/2014 04:03 PM   Modules accepted: Orders

## 2014-07-24 NOTE — Progress Notes (Signed)
Patient ID: Kathleen [name], female   DOB: 1961-02-05, 53 y.o.   MRN: [id number] 53 yo amenorrheic for 9 months with recent onset of vaginal bleeding in June. Patient reports monthly cycles in 2014. She started skipping periods in early 2015. Her last cycle was in may and was excessive, changing 9-10 pads/daily. She has not had a period since that time.  Past Medical History  Diagnosis Date  . Asthma   . Bronchitis   . Sleep apnea     NO CPAP   Past Surgical History  Procedure Laterality Date  . Cesarean section    . Oophorectomy      1/2 ovary removed  . Vagina surgery      mesh   Family History  Problem Relation Age of Onset  . Colon cancer Mother 34  . Colon cancer Father 25  . Prostate cancer Father    History  Substance Use Topics  . Smoking status: Current Every Day Smoker -- 0.25 packs/day    Types: Cigarettes  . Smokeless tobacco: Never Used  . Alcohol Use: 1.0 oz/week    2 drink(s) per week   GENERAL: Well-developed, well-nourished female in no acute distress.  ABDOMEN: Soft, nontender, nondistended. No organomegaly. PELVIC: Normal external female genitalia. Vagina is pink and rugated.  Normal discharge. Normal appearing cervix. Uterus is normal in size. No adnexal mass or tenderness. EXTREMITIES: No cyanosis, clubbing, or edema, 2+ distal pulses.   A/P 53 yo with DUB - Discussed performing and endometrial biopsy ENDOMETRIAL BIOPSY     The indications for endometrial biopsy were reviewed.   Risks of the biopsy including cramping, bleeding, infection, uterine perforation, inadequate specimen and need for additional procedures  were discussed. The patient states she understands and agrees to undergo procedure today. Consent was signed. Time out was performed. Urine HCG was negative. A sterile speculum was placed in the patient's vagina and the cervix was prepped with Betadine. A single-toothed tenaculum was placed on the anterior lip of the cervix to stabilize it. The  uterine cavity could not be entered secondary to cervical stenosis.   - Pelvic ultrasound ordered - Patient will be contacted with results and re-scheduled for endometrial biopsy if indicated - RTC prn

## 2014-07-24 NOTE — Progress Notes (Signed)
Pt states she has not had Pap test in 3-5 yrs.

## 2014-07-30 ENCOUNTER — Ambulatory Visit (HOSPITAL_COMMUNITY)
Admission: RE | Admit: 2014-07-30 | Discharge: 2014-07-30 | Disposition: A | Discharge status: Home / Self Care | Payer: No Typology Code available for payment source | Source: Ambulatory Visit | Attending: Obstetrics and Gynecology | Admitting: Obstetrics and Gynecology

## 2014-07-30 DIAGNOSIS — N925 Other specified irregular menstruation: Secondary | ICD-10-CM | POA: Insufficient documentation

## 2014-07-30 DIAGNOSIS — N939 Abnormal uterine and vaginal bleeding, unspecified: Secondary | ICD-10-CM

## 2014-07-30 DIAGNOSIS — N938 Other specified abnormal uterine and vaginal bleeding: Secondary | ICD-10-CM | POA: Insufficient documentation

## 2014-07-30 DIAGNOSIS — N949 Unspecified condition associated with female genital organs and menstrual cycle: Secondary | ICD-10-CM | POA: Insufficient documentation

## 2014-07-31 ENCOUNTER — Ambulatory Visit (AMBULATORY_SURGERY_CENTER): Payer: Self-pay | Admitting: Internal Medicine

## 2014-07-31 ENCOUNTER — Encounter: Payer: Self-pay | Admitting: Internal Medicine

## 2014-07-31 VITALS — BP 112/69 | HR 71 | Temp 97.8°F | Resp 15 | Ht 62.0 in | Wt 162.0 lb

## 2014-07-31 DIAGNOSIS — Z1211 Encounter for screening for malignant neoplasm of colon: Secondary | ICD-10-CM

## 2014-07-31 DIAGNOSIS — D126 Benign neoplasm of colon, unspecified: Secondary | ICD-10-CM

## 2014-07-31 MED ORDER — SODIUM CHLORIDE 0.9 % IV SOLN: 500.0000 mL | INTRAVENOUS | Status: DC

## 2014-07-31 NOTE — Progress Notes (Signed)
Procedure ends, to recovery, report given and VSS. 

## 2014-07-31 NOTE — Patient Instructions (Signed)
Colon polyps removed today, see handout given. Resume current medications. Call us with any questions or concerns.Thank you.   YOU HAD AN ENDOSCOPIC PROCEDURE TODAY AT [facility]: Refer to the procedure report that was given to you for any specific questions about what was found during the examination.  If the procedure report does not answer your questions, please call your gastroenterologist to clarify.  If you requested that your care partner not be given the details of your procedure findings, then the procedure report has been included in a sealed envelope for you to review at your convenience later.  YOU SHOULD EXPECT: Some feelings of bloating in the abdomen. Passage of more gas than usual.  Walking can help get rid of the air that was put into your GI tract during the procedure and reduce the bloating. If you had a lower endoscopy (such as a colonoscopy or flexible sigmoidoscopy) you may notice spotting of blood in your stool or on the toilet paper. If you underwent a bowel prep for your procedure, then you may not have a normal bowel movement for a few days.  DIET: Your first meal following the procedure should be a light meal and then it is ok to progress to your normal diet.  A half-sandwich or bowl of soup is an example of a good first meal.  Heavy or fried foods are harder to digest and may make you feel nauseous or bloated.  Likewise meals heavy in dairy and vegetables can cause extra gas to form and this can also increase the bloating.  Drink plenty of fluids but you should avoid alcoholic beverages for 24 hours.  ACTIVITY: Your care partner should take you home directly after the procedure.  You should plan to take it easy, moving slowly for the rest of the day.  You can resume normal activity the day after the procedure however you should NOT DRIVE or use heavy machinery for 24 hours (because of the sedation medicines used during the test).    SYMPTOMS TO REPORT  IMMEDIATELY: A gastroenterologist can be reached at any hour.  During normal business hours, 8:30 AM to 5:00 PM Monday through Friday, call [phone].  After hours and on weekends, please call the GI answering service at [phone] who will take a message and have the physician on call contact you.   Following lower endoscopy (colonoscopy or flexible sigmoidoscopy):  Excessive amounts of blood in the stool  Significant tenderness or worsening of abdominal pains  Swelling of the abdomen that is new, acute  Fever of 100F or higher  Following upper endoscopy (EGD)  Vomiting of blood or coffee ground material  New chest pain or pain under the shoulder blades  Painful or persistently difficult swallowing  New shortness of breath  Fever of 100F or higher  Black, tarry-looking stools  FOLLOW UP: If any biopsies were taken you will be contacted by phone or by letter within the next 1-3 weeks.  Call your gastroenterologist if you have not heard about the biopsies in 3 weeks.  Our staff will call the home number listed on your records the next business day following your procedure to check on you and address any questions or concerns that you may have at that time regarding the information given to you following your procedure. This is a courtesy call and so if there is no answer at the home number and we have not heard from you through the emergency physician on call,  we will assume that you have returned to your regular daily activities without incident.  SIGNATURES/CONFIDENTIALITY: You and/or your care partner have signed paperwork which will be entered into your electronic medical record.  These signatures attest to the fact that that the information above on your After Visit Summary has been reviewed and is understood.  Full responsibility of the confidentiality of this discharge information lies with you and/or your care-partner.

## 2014-07-31 NOTE — Op Note (Signed)
[facility]  [address] [place], [id number]   COLONOSCOPY PROCEDURE REPORT  PATIENT: Kathleen [name], Kathleen [name]  MR#: [id number] BIRTHDATE: [birth year] , 52  yrs. old GENDER: Female ENDOSCOPIST: Jerene Bears, MD REFERRED BY: Duwaine Maxin, MD PROCEDURE DATE:  07/31/2014 PROCEDURE:   Colonoscopy with snare polypectomy and Colonoscopy with cold biopsy polypectomy First Screening Colonoscopy - Avg.  risk and is 50 yrs.  old or older Yes.  Prior Negative Screening - Now for repeat screening. N/A  History of Adenoma - Now for follow-up colonoscopy & has been > or = to 3 yrs.  N/A  Polyps Removed Today? Yes. ASA CLASS:   Class II INDICATIONS:average risk screening and first colonoscopy. MEDICATIONS: MAC sedation, administered by CRNA and propofol (Diprivan) 250mg  IV  DESCRIPTION OF PROCEDURE:   After the risks benefits and alternatives of the procedure were thoroughly explained, informed consent was obtained.  A digital rectal exam revealed no rectal mass.   The LB [id number]  endoscope was introduced through the anus and advanced to the cecum, which was identified by both the appendix and ileocecal valve. No adverse events experienced. The quality of the prep was good, using MoviPrep  The instrument was then slowly withdrawn as the colon was fully examined.  COLON FINDINGS: Two sessile polyps ranging between 2-36mm in size were found at the cecum and in the sigmoid colon.  Polypectomy was performed with cold forceps and using cold snare.  All resections were complete and all polyp tissue was completely retrieved.   The colon mucosa was otherwise normal.  Retroflexed views revealed no abnormalities. The time to cecum=2 minutes 43 seconds.  Withdrawal time=9 minutes 39 seconds.  The scope was withdrawn and the procedure completed. COMPLICATIONS: There were no complications.  ENDOSCOPIC IMPRESSION: 1.   Two sessile polyps ranging between 2-45mm in size were found at the cecum  and in the sigmoid colon; Polypectomy was performed with cold forceps and using cold snare 2.   The colon mucosa was otherwise normal  RECOMMENDATIONS: 1.  Await pathology results 2.  If the polyps removed today are proven to be adenomatous (pre-cancerous) polyps, you will need a repeat colonoscopy in 5 years.  Otherwise you should continue to follow colorectal cancer screening guidelines for "routine risk" patients with colonoscopy in 10 years.  You will receive a letter within 1-2 weeks with the results of your biopsy as well as final recommendations.  Please call my office if you have not received a letter after 3 weeks.   eSigned:  Jerene Bears, MD 07/31/2014 1:29 PM   cc: The Patient; Duwaine Maxin, MD

## 2014-07-31 NOTE — Progress Notes (Signed)
Called to room to assist during endoscopic procedure.  Patient ID and intended procedure confirmed with present staff. Received instructions for my participation in the procedure from the performing physician.  

## 2014-08-01 ENCOUNTER — Telehealth: Payer: Self-pay

## 2014-08-01 ENCOUNTER — Telehealth: Payer: Self-pay | Admitting: *Deleted

## 2014-08-01 NOTE — Telephone Encounter (Signed)
Attempted to call patient on both available numbers. No answer on either. Left messages stating we are calling with results, please call clinic.

## 2014-08-01 NOTE — Telephone Encounter (Signed)
No answer. Name identifier. Message left to call if any questions or concerns. 

## 2014-08-01 NOTE — Telephone Encounter (Signed)
Message copied by Geanie Logan on Thu Aug 01, 2014  4:11 PM ------      Message from: CONSTANT, [place]      Created: Thu Aug 01, 2014  3:38 PM       Please inform patient of normal ultrasound. She may return to the office for re-attempt at endometrial biopsy following cytotec administration or she may keep track of a menstrual calendar and return if bleeding becomes excessive.            If patient opts to return for endometrial biopsy, please call in cytotec 800 micrograms to be inserted in the vagina the night prior to the appointment            Thanks            Peggy ------

## 2014-08-02 NOTE — Telephone Encounter (Signed)
Contacted patient, results and plan given, pt will keep track of menstrual cycle and return if increase in bleeding.

## 2014-08-05 ENCOUNTER — Encounter: Payer: Self-pay | Admitting: Internal Medicine

## 2014-08-07 ENCOUNTER — Encounter: Payer: Self-pay | Admitting: General Practice

## 2014-10-14 ENCOUNTER — Encounter: Payer: Self-pay | Admitting: Internal Medicine

## 2014-10-16 ENCOUNTER — Other Ambulatory Visit: Payer: Self-pay | Admitting: Obstetrics and Gynecology

## 2014-12-10 ENCOUNTER — Emergency Department (HOSPITAL_COMMUNITY)
Admission: EM | Admit: 2014-12-10 | Discharge: 2014-12-10 | Disposition: A | Discharge status: Home / Self Care | Payer: Self-pay | Attending: Emergency Medicine | Admitting: Emergency Medicine

## 2014-12-10 ENCOUNTER — Emergency Department (HOSPITAL_COMMUNITY): Payer: Self-pay

## 2014-12-10 ENCOUNTER — Encounter (HOSPITAL_COMMUNITY): Payer: Self-pay | Admitting: Emergency Medicine

## 2014-12-10 DIAGNOSIS — Y9289 Other specified places as the place of occurrence of the external cause: Secondary | ICD-10-CM | POA: Insufficient documentation

## 2014-12-10 DIAGNOSIS — M25552 Pain in left hip: Secondary | ICD-10-CM

## 2014-12-10 DIAGNOSIS — Y92009 Unspecified place in unspecified non-institutional (private) residence as the place of occurrence of the external cause: Secondary | ICD-10-CM

## 2014-12-10 DIAGNOSIS — Z8742 Personal history of other diseases of the female genital tract: Secondary | ICD-10-CM | POA: Insufficient documentation

## 2014-12-10 DIAGNOSIS — Y9389 Activity, other specified: Secondary | ICD-10-CM | POA: Insufficient documentation

## 2014-12-10 DIAGNOSIS — Z791 Long term (current) use of non-steroidal anti-inflammatories (NSAID): Secondary | ICD-10-CM | POA: Insufficient documentation

## 2014-12-10 DIAGNOSIS — W108XXA Fall (on) (from) other stairs and steps, initial encounter: Secondary | ICD-10-CM | POA: Insufficient documentation

## 2014-12-10 DIAGNOSIS — S20212A Contusion of left front wall of thorax, initial encounter: Secondary | ICD-10-CM | POA: Insufficient documentation

## 2014-12-10 DIAGNOSIS — Z7951 Long term (current) use of inhaled steroids: Secondary | ICD-10-CM | POA: Insufficient documentation

## 2014-12-10 DIAGNOSIS — Y998 Other external cause status: Secondary | ICD-10-CM | POA: Insufficient documentation

## 2014-12-10 DIAGNOSIS — Z8669 Personal history of other diseases of the nervous system and sense organs: Secondary | ICD-10-CM | POA: Insufficient documentation

## 2014-12-10 DIAGNOSIS — J45909 Unspecified asthma, uncomplicated: Secondary | ICD-10-CM | POA: Insufficient documentation

## 2014-12-10 DIAGNOSIS — W19XXXA Unspecified fall, initial encounter: Secondary | ICD-10-CM

## 2014-12-10 DIAGNOSIS — Z79899 Other long term (current) drug therapy: Secondary | ICD-10-CM | POA: Insufficient documentation

## 2014-12-10 DIAGNOSIS — S79912A Unspecified injury of left hip, initial encounter: Secondary | ICD-10-CM | POA: Insufficient documentation

## 2014-12-10 DIAGNOSIS — Z720 Tobacco use: Secondary | ICD-10-CM | POA: Insufficient documentation

## 2014-12-10 LAB — I-STAT TROPONIN, ED: Troponin i, poc: 0 ng/mL (ref 0.00–0.08)

## 2014-12-10 LAB — CBC
HEMATOCRIT: 45.1 % (ref 36.0–46.0)
Hemoglobin: 14.2 g/dL (ref 12.0–15.0)
MCH: 27.4 pg (ref 26.0–34.0)
MCHC: 31.5 g/dL (ref 30.0–36.0)
MCV: 87.1 fL (ref 78.0–100.0)
Platelets: 357 10*3/uL (ref 150–400)
RBC: 5.18 MIL/uL — ABNORMAL HIGH (ref 3.87–5.11)
RDW: 13.7 % (ref 11.5–15.5)
WBC: 9.8 10*3/uL (ref 4.0–10.5)

## 2014-12-10 LAB — COMPREHENSIVE METABOLIC PANEL
ALT: 21 U/L (ref 0–35)
AST: 21 U/L (ref 0–37)
Albumin: 4 g/dL (ref 3.5–5.2)
Alkaline Phosphatase: 79 U/L (ref 39–117)
Anion gap: 5 (ref 5–15)
BUN: 14 mg/dL (ref 6–23)
CO2: 29 mmol/L (ref 19–32)
Calcium: 9 mg/dL (ref 8.4–10.5)
Chloride: 106 mEq/L (ref 96–112)
Creatinine, Ser: 0.97 mg/dL (ref 0.50–1.10)
GFR calc Af Amer: 75 mL/min — ABNORMAL LOW (ref >90)
GFR calc non Af Amer: 65 mL/min — ABNORMAL LOW (ref >90)
Glucose, Bld: 68 mg/dL — ABNORMAL LOW (ref 70–99)
POTASSIUM: 3.8 mmol/L (ref 3.5–5.1)
Sodium: 140 mmol/L (ref 135–145)
Total Bilirubin: 0.3 mg/dL (ref 0.3–1.2)
Total Protein: 7.6 g/dL (ref 6.0–8.3)

## 2014-12-10 LAB — BRAIN NATRIURETIC PEPTIDE: B Natriuretic Peptide: 20.5 pg/mL (ref 0.0–100.0)

## 2014-12-10 MED ORDER — OXYCODONE-ACETAMINOPHEN 5-325 MG PO TABS
1.0000 | ORAL_TABLET | Freq: Once | ORAL | Status: AC
  Administered 2014-12-10: 1 via ORAL
  Filled 2014-12-10: qty 1

## 2014-12-10 MED ORDER — METHOCARBAMOL 500 MG PO TABS: 1000.0000 mg | ORAL_TABLET | Freq: Four times a day (QID) | ORAL | Status: DC | PRN

## 2014-12-10 MED ORDER — OXYCODONE-ACETAMINOPHEN 5-325 MG PO TABS: ORAL_TABLET | ORAL | Status: DC

## 2014-12-10 MED ORDER — ONDANSETRON 4 MG PO TBDP
4.0000 mg | ORAL_TABLET | Freq: Once | ORAL | Status: AC
  Administered 2014-12-10: 4 mg via ORAL
  Filled 2014-12-10: qty 1

## 2014-12-10 MED ORDER — MORPHINE SULFATE 4 MG/ML IJ SOLN
4.0000 mg | Freq: Once | INTRAMUSCULAR | Status: AC
  Administered 2014-12-10: 4 mg via INTRAMUSCULAR
  Filled 2014-12-10: qty 1

## 2014-12-10 MED ORDER — MORPHINE SULFATE 4 MG/ML IJ SOLN: 4.0000 mg | Freq: Once | INTRAMUSCULAR | Status: DC

## 2014-12-10 MED ORDER — METHOCARBAMOL 500 MG PO TABS
750.0000 mg | ORAL_TABLET | Freq: Once | ORAL | Status: AC
  Administered 2014-12-10: 750 mg via ORAL
  Filled 2014-12-10: qty 2

## 2014-12-10 NOTE — Progress Notes (Signed)
Peak flow 103 post

## 2014-12-10 NOTE — ED Notes (Signed)
When entering the room, patient talking on her cell phone. Appears in no acute distress.

## 2014-12-10 NOTE — ED Notes (Signed)
Pt c/o SOB that started last night after she fell down the steps, stating she "knocked the breath out of her self". Pt also states she has a sensation of something moving or popping in roght ear. Pt c/o pain in head and chest.

## 2014-12-10 NOTE — Discharge Instructions (Signed)
°  It is very important that you take deep breaths to prevent lung collapse and infection.  Take 10 deep breaths every hour and use your incentive spirometer to prevent lung collapse.  If you develop cough, fever or shortness of breath return immediately to the emergency room.  Take robaxin and/or percocet for breakthrough pain, do not drink alcohol, drive, care for children or perfom other critical tasks while taking robaxin and/or percocet .  Please follow with your primary care doctor in the next 2 days for a check-up. They must obtain records for further management.   Do not hesitate to return to the Emergency Department for any new, worsening or concerning symptoms.

## 2014-12-10 NOTE — ED Provider Notes (Signed)
CSN: [id number]     Arrival date & time 12/10/14  1607 History   First MD Initiated Contact with Patient 12/10/14 1757     Chief Complaint  Patient presents with  . Shortness of Breath  . Ear Problem    right     (Consider location/radiation/quality/duration/timing/severity/associated sxs/prior Treatment) HPI   Kathleen [name] is a 53 y.o. female complaining of shortness of breath, left hip pain sensation of foreign body in right ear and dizziness (denies vertigo) status post fall last night. Patient states that she slipped on a rainy ground as she was going down steps, states that she fell down 8 steps and landed on the grass, on her chest, patient denies any head trauma, LOC, use of anticoagulation, change in vision, dysarthria, cervicalgia, chest pain. She has 2 kn on her right breast, states that these were not there before the fall. States that she had a mammogram last year and is due for another one shortly. States that she was taking leftover hydrocodone at home with good relief but she has run out. Patient reports severe pain to left hip which she rates at 8 out of 10, she has and which were states painful. Patient denies headache, anticoagulation, numbness, nausea.  Past Medical History  Diagnosis Date  . Asthma   . Bronchitis   . Sleep apnea     NO CPAP  . Menorrhagia    Past Surgical History  Procedure Laterality Date  . Cesarean section    . Oophorectomy      1/2 ovary removed  . Vagina surgery      mesh   Family History  Problem Relation Age of Onset  . Colon cancer Mother 61  . Colon cancer Father 52  . Prostate cancer Father    History  Substance Use Topics  . Smoking status: Current Every Day Smoker -- 0.25 packs/day for 30 years    Types: Cigarettes  . Smokeless tobacco: Never Used  . Alcohol Use: 1.0 oz/week    2 drink(s) per week   OB History    Gravida Para Term Preterm AB TAB SAB Ectopic Multiple Living   3 2 2  1 1    2      Review of Systems  10  systems reviewed and found to be negative, except as noted in the HPI.  Allergies  Tramadol  Home Medications   Prior to Admission medications   Medication Sig Start Date End Date Taking? Authorizing Provider  acetaminophen-codeine (TYLENOL #3) 300-30 MG per tablet Take 1 tablet by mouth every 6 (six) hours as needed for moderate pain (pain).   Yes Historical Provider, MD  albuterol (PROVENTIL HFA;VENTOLIN HFA) 108 (90 BASE) MCG/ACT inhaler Inhale 4 puffs into the lungs every 4 (four) hours as needed for wheezing or shortness of breath. 06/24/14  Yes Vijaya Mercer Pod, MD  HYDROcodone-acetaminophen (NORCO/VICODIN) 5-325 MG per tablet Take 1 tablet by mouth every 6 (six) hours as needed for moderate pain (pain).   Yes Historical Provider, MD  ipratropium-albuterol (DUONEB) 0.5-2.5 (3) MG/3ML SOLN Take 3 mLs by nebulization every 6 (six) hours. 06/19/14  Yes Vijaya Mercer Pod, MD  mometasone-formoterol (DULERA) 100-5 MCG/ACT AERO Inhale 2 puffs into the lungs 2 (two) times daily. 05/16/14  Yes Marianne L York, PA-C  naproxen (NAPROSYN) 375 MG tablet TAKE 1 TABLET (375 MG TOTAL) BY MOUTH 2 (TWO) TIMES DAILY WITH A MEAL. 10/17/14  Yes Peggy Constant, MD   BP 117/68 mmHg  Pulse 69  Temp(Src) 97.5 F (36.4 C) (Oral)  Resp 17  SpO2 100% Physical Exam  Constitutional: She is oriented to person, place, and time. She appears well-developed and well-nourished. No distress.  HENT:  Head: Normocephalic and atraumatic.  Mouth/Throat: Oropharynx is clear and moist.  Eyes: Conjunctivae and EOM are normal. Pupils are equal, round, and reactive to light.  Neck: Normal range of motion. Neck supple.  Cardiovascular: Normal rate, regular rhythm and intact distal pulses.   Pulmonary/Chest: Effort normal and breath sounds normal. No stridor. No respiratory distress. She has no wheezes. She has no rales. She exhibits no tenderness.  Breast exam is chaperoned by technician, mobile, tender small lesion to  left breast at 10:00. No abnormal nipple discharge. No peu d'orange.  Abdominal: Soft. Bowel sounds are normal. She exhibits no distension and no mass. There is no tenderness. There is no rebound and no guarding.  Genitourinary: Guaiac negative stool.  Musculoskeletal: Normal range of motion.  Is diffusely tender to palpation at the left hip, there is no focal tenderness palpation of the greater trochanter, she has full active range of motion, she is distally neurovascularly intact.  Neurological: She is alert and oriented to person, place, and time.  Psychiatric: She has a normal mood and affect.  Nursing note and vitals reviewed.   ED Course  Procedures (including critical care time) Labs Review Labs Reviewed  CBC - Abnormal; Notable for the following:    RBC 5.18 (*)    All other components within normal limits  COMPREHENSIVE METABOLIC PANEL - Abnormal; Notable for the following:    Glucose, Bld 68 (*)    GFR calc non Af Amer 65 (*)    GFR calc Af Amer 75 (*)    All other components within normal limits  BRAIN NATRIURETIC PEPTIDE  I-STAT TROPOININ, ED    Imaging Review Dg Chest 2 View  12/10/2014   CLINICAL DATA:  Fall down stairs 1 day ago directly onto chest. Shortness of breath and right-sided chest pain since falling. Prior smoker. History of hypertension, bronchitis, and asthma.  EXAM: CHEST  2 VIEW  COMPARISON:  05/13/2014  FINDINGS: The cardiomediastinal silhouette is within normal limits. The lungs are normally to slightly hyperinflated. No confluent airspace opacity, pulmonary edema, pleural effusion, or pneumothorax is identified. Minimal degenerative endplate spurring is present in the thoracic spine. No acute osseous abnormality is identified. A metallic marker is noted in the right breast.  IMPRESSION: No active cardiopulmonary disease.   Electronically Signed   By: Logan Bores   On: 12/10/2014 17:10     EKG Interpretation   Date/Time:  Tuesday December 10 2014  16:16:29 EST Ventricular Rate:  95 PR Interval:  150 QRS Duration: 69 QT Interval:  360 QTC Calculation: 452 R Axis:   -63 Text Interpretation:  Sinus rhythm LAD, consider left anterior fascicular  block Abnormal R-wave progression, early transition Baseline wander in  lead(s) V5 Confirmed by COOK  MD, BRIAN ([id number]) on 12/10/2014 4:20:11 PM      MDM   Final diagnoses:  Arthralgia of left hip  Fall at home, initial encounter  Contusion of rib, left, initial encounter    Filed Vitals:   12/10/14 1614 12/10/14 1832 12/10/14 2050 12/10/14 2052  BP: 125/85 117/68 109/80   Pulse: 98 69 83   Temp: 97.5 F (36.4 C)  98.7 F (37.1 C)   TempSrc: Oral  Oral   Resp: 20 17  18   SpO2: 100% 100% 99%  Medications  ondansetron (ZOFRAN-ODT) disintegrating tablet 4 mg (4 mg Oral Given 12/10/14 1904)  morphine 4 MG/ML injection 4 mg (4 mg Intramuscular Given 12/10/14 1905)  oxyCODONE-acetaminophen (PERCOCET/ROXICET) 5-325 MG per tablet 1 tablet (1 tablet Oral Given 12/10/14 2029)  methocarbamol (ROBAXIN) tablet 750 mg (750 mg Oral Given 12/10/14 2029)    Kathleen [name] is a pleasant 53 y.o. female presenting with chest pain status post slip and fall last night. Patient is having pain in the left hip as well. She is ambulatory but walks with a limp. X-ray of the chest and hip are negative. They do see a focus of air in the hip film however this is where patient received her IM shot of morphine. Will be referred to the breast center for evaluation of her breast not, I think these are likely secondary to trauma however she is due for mammogram and would encourage her to follow closely with them.  This is a shared visit with the attending physician who personally evaluated the patient and agrees with the care plan.   Evaluation does not show pathology that would require ongoing emergent intervention or inpatient treatment. Pt is hemodynamically stable and mentating appropriately. Discussed  findings and plan with patient/guardian, who agrees with care plan. All questions answered. Return precautions discussed and outpatient follow up given.   Discharge Medication List as of 12/10/2014  8:41 PM    START taking these medications   Details  methocarbamol (ROBAXIN) 500 MG tablet Take 2 tablets (1,000 mg total) by mouth 4 (four) times daily as needed (Pain)., Starting 12/10/2014, Until Discontinued, Print    oxyCODONE-acetaminophen (PERCOCET/ROXICET) 5-325 MG per tablet 1 to 2 tabs PO q6hrs  PRN for pain, Print             Monico Blitz, PA-C 12/11/14 0141  Nat Christen, MD 12/12/14 0021

## 2014-12-10 NOTE — ED Notes (Signed)
Provided patient incentive spirometer and educated on how to use. Pt reports understanding.

## 2014-12-11 NOTE — Progress Notes (Signed)
  CARE MANAGEMENT ED NOTE 12/11/2014  Patient:  [facility]   Account Number:  [id number]  Date Initiated:  12/11/2014  Documentation initiated by:  Livia Snellen  Subjective/Objective Assessment:   Patient presented to Ed with shortness of breath and left hip pain post fall     Subjective/Objective Assessment Detail:     Action/Plan:   Action/Plan Detail:   Anticipated DC Date:  12/10/2014     Status Recommendation to Physician:   Result of Recommendation:    Other ED Services  Consult [facility]  CM consult  Other  Medication Assistance    Choice offered to / List presented to:            Status of service:  Completed, signed off  ED Comments:   ED Comments Detail:  EDCM spoke to [facility] telephone for  medication assistance.  Patient reports she does not have a pcp or insurnace and is unemployed.  Patient reports she uses food stamps.  Patient requestin assistance with Percocet and Robaxin.  [facility] informed patient that [facility] does not assist with pain medications.  Patient reports [facility] uses [facility] on [address].  EDCM found prices for patient's medications to cost less at [facility].  Patient reports she can go to [facility] on [address].  EDCM called [facility] on [address] [phone] and spoke to [facility] in the pharmacy to inform him that [facility] will be faxing over a discount card from [email or web address] for Robaxin.  Patient agreeable to new copay.  [facility] faxed over discount card to [facility] at [phone] at 1552pm with confirmation of receipt at 1553pm. Patient reports she will have her family member take her prescriptions to [facility] on [address]. Patient very thankful for services.  No further EDCM needs at this time.

## 2014-12-11 NOTE — Progress Notes (Signed)
Patient reports she is applying for Medicaid.

## 2014-12-21 ENCOUNTER — Encounter: Payer: Self-pay | Admitting: Internal Medicine

## 2015-01-15 ENCOUNTER — Ambulatory Visit (INDEPENDENT_AMBULATORY_CARE_PROVIDER_SITE_OTHER): Payer: Self-pay | Admitting: Internal Medicine

## 2015-01-15 ENCOUNTER — Encounter: Payer: Self-pay | Admitting: Internal Medicine

## 2015-01-15 ENCOUNTER — Ambulatory Visit (HOSPITAL_COMMUNITY)
Admission: RE | Admit: 2015-01-15 | Discharge: 2015-01-15 | Disposition: A | Discharge status: Home / Self Care | Payer: Medicaid Other | Source: Ambulatory Visit | Attending: Internal Medicine | Admitting: Internal Medicine

## 2015-01-15 VITALS — BP 117/79 | HR 73 | Temp 97.9°F | Ht 62.0 in | Wt 171.6 lb

## 2015-01-15 DIAGNOSIS — N63 Unspecified lump in unspecified breast: Secondary | ICD-10-CM

## 2015-01-15 DIAGNOSIS — R1030 Lower abdominal pain, unspecified: Secondary | ICD-10-CM | POA: Diagnosis present

## 2015-01-15 DIAGNOSIS — J45901 Unspecified asthma with (acute) exacerbation: Secondary | ICD-10-CM

## 2015-01-15 DIAGNOSIS — M25552 Pain in left hip: Secondary | ICD-10-CM

## 2015-01-15 MED ORDER — IPRATROPIUM BROMIDE 0.02 % IN SOLN
0.5000 mg | Freq: Once | RESPIRATORY_TRACT | Status: AC
  Administered 2015-01-15: 0.5 mg via RESPIRATORY_TRACT

## 2015-01-15 MED ORDER — ALBUTEROL SULFATE (2.5 MG/3ML) 0.083% IN NEBU
2.5000 mg | INHALATION_SOLUTION | Freq: Once | RESPIRATORY_TRACT | Status: AC
  Administered 2015-01-15: 2.5 mg via RESPIRATORY_TRACT

## 2015-01-15 MED ORDER — KETOROLAC TROMETHAMINE 30 MG/ML IJ SOLN
30.0000 mg | Freq: Once | INTRAMUSCULAR | Status: AC
  Administered 2015-01-15: 30 mg via INTRAMUSCULAR

## 2015-01-15 MED ORDER — IPRATROPIUM-ALBUTEROL 0.5-2.5 (3) MG/3ML IN SOLN: 3.0000 mL | RESPIRATORY_TRACT | Status: DC

## 2015-01-15 MED ORDER — METHOCARBAMOL 500 MG PO TABS: 1000.0000 mg | ORAL_TABLET | Freq: Four times a day (QID) | ORAL | Status: DC | PRN

## 2015-01-15 MED ORDER — MOMETASONE FURO-FORMOTEROL FUM 100-5 MCG/ACT IN AERO: 2.0000 | INHALATION_SPRAY | Freq: Two times a day (BID) | RESPIRATORY_TRACT | Status: DC

## 2015-01-15 MED ORDER — IPRATROPIUM-ALBUTEROL 0.5-2.5 (3) MG/3ML IN SOLN: 3.0000 mL | Freq: Four times a day (QID) | RESPIRATORY_TRACT | Status: DC

## 2015-01-15 MED ORDER — ALBUTEROL SULFATE HFA 108 (90 BASE) MCG/ACT IN AERS: 4.0000 | INHALATION_SPRAY | RESPIRATORY_TRACT | Status: DC | PRN

## 2015-01-15 NOTE — Assessment & Plan Note (Addendum)
This has been ongoing since the patient's traumatic fall on 12/10/14. She did have some minimal relief with narcotics and muscle relaxants. Did not participate in any physical therapy and still having some muscle spasms. This maybe referred pain from lumbar spine as well as this was not done before.  -Hip x-ray of left, and lumbar spine -Refill Robaxin and oxy IR -physical therapy referral -IM Toradol 30 given in clinic -f/u in 2 wks if pt still having pain would liely worry about soft tissue pathology and would recommend MRI to better assess.

## 2015-01-15 NOTE — Assessment & Plan Note (Signed)
She appears to have acute asthma exacerbation in the setting of not having her nebulizer treatments for about the past month. Only slight wheezes no other signs or symptoms to suggest ongoing infection. -Refill DuoNeb nebs and inhalers -Given DuoNeb in clinic

## 2015-01-15 NOTE — Progress Notes (Signed)
Subjective:   Patient ID: Kathleen [name] female   DOB: 06-23-1961 54 y.o.   MRN: [id number]  HPI: Kathleen [name] is a 54 y.o. woman with a past medical history as listed below presents for ED follow-up.  The patient presented to the ED on 12/10/14 status post a fall. All imaging studies at that time were negative. There was also an incidental finding of a small mobile breast mass at the time.  Since that time the patient states that she has been limping with walking and having muscle spasms in the left back and hip area. She has tried the lidocaine patches with little to no success. She also completed a course of Robaxin and oxycodone aren't are with minimal relief. She's had no numbness or tingling that radiates down she's had no urinary or bowel incontinence and she has had no repeat trauma to the area. She has not completed any physical therapy and has been reluctant to walk and exercise given the pain.  She has also been out of her medications for her asthma for about one month and feels that she is more "wheezy" than usual. She's had no chest pain and minimal shortness of breath and has been using her albuterol inhaler more daily given that she ran out of her nebulizer treatments.    Past Medical History  Diagnosis Date  . GERD (gastroesophageal reflux disease)   . Asthma     2 sets of PFT's in 04/09 without sign variability. Last set with significant decrease in FEV1 with saline alone, suggesting Asthma but  recommended clinical corelation   . Polysubstance abuse     none since March 17,2009.  Marland Kitchen Lower extremity edema     Neg ABI's, normal 2D echo, normal albumin  . Chronic headache   . Chronic pain     normal work up including TSH, RPR, B12, HIV, plain films, 2 ESR's, ANA, CK, RF along with routine CBC, CMET and UA. Further work up includes CRP, ESR, SPEP/UPEP, hepatitis erology, A1C , repeat ANA  . Bipolar 1 disorder     therapist is Caryl Asp and is followed by [facility]  . Blackout     negative work up including ESR, ANA, opthalmology referral, carotid dopplers, 2D echo, MRI and EEG.  . DUB (dysfunctional uterine bleeding)     and pelvic pain, with negative endometrial bx in 07/09 and transvaginal U/S significant for mild fibroids in 0/09.  . 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 Asthma   . Bronchitis   . Sleep apnea     NO CPAP  . Menorrhagia    Current Outpatient Prescriptions  Medication Sig Dispense Refill  . albuterol (PROVENTIL HFA;VENTOLIN HFA) 108 (90 BASE) MCG/ACT inhaler Inhale 4 puffs into the lungs every 4 (four) hours as needed for wheezing or shortness of breath. 3 Inhaler 3  . albuterol (PROVENTIL) (2.5 MG/3ML) 0.083% nebulizer solution Take 3 mLs (2.5 mg total) by nebulization every 6 (six) hours as needed for wheezing. 75 mL 12  . divalproex (DEPAKOTE) 500 MG 24 hr tablet Take 2 tablets by mouth at bedtime.     . Fluticasone-Salmeterol (ADVAIR DISKUS) 250-50 MCG/DOSE AEPB Inhale 1 puff into the lungs 2 (two) times daily. 1 each 11  . ipratropium-albuterol (DUONEB) 0.5-2.5 (3) MG/3ML SOLN Take 3 mLs by nebulization every 6 (six) hours. 360 mL 12  . methocarbamol (ROBAXIN) 500 MG tablet Take  2 tablets (1,000 mg total) by mouth 4 (four) times daily as needed (Pain). 20 tablet 0  . mometasone-formoterol (DULERA) 100-5 MCG/ACT AERO Inhale 2 puffs into the lungs 2 (two) times daily. 3 Inhaler 3  . naproxen (NAPROSYN) 375 MG tablet TAKE 1 TABLET (375 MG TOTAL) BY MOUTH 2 (TWO) TIMES DAILY WITH A MEAL. 60 tablet 0  . oxyCODONE-acetaminophen (PERCOCET/ROXICET) 5-325 MG per tablet 1 to 2 tabs PO q6hrs  PRN for pain 15 tablet 0   Current Facility-Administered Medications  Medication Dose Route Frequency Provider Last Rate Last Dose  . ipratropium-albuterol (DUONEB) 0.5-2.5 (3) MG/3ML nebulizer solution 3 mL  3 mL Nebulization Q4H Clinton Gallant, MD      . ketorolac (TORADOL) 30 MG/ML injection 30  mg  30 mg Intramuscular Once Clinton Gallant, MD       Family History  Problem Relation Age of Onset  . Diabetes Father   . Hypertension Father   . Kidney disease Father   . Kidney disease Sister   . Colon cancer Mother 82  . Colon cancer Father 36  . Prostate cancer Father    History   Social History  . Marital Status: Single    Spouse Name: N/A    Number of Children: N/A  . Years of Education: N/A   Social History Main Topics  . Smoking status: Former Smoker -- 0.25 packs/day for 30 years    Types: Cigarettes  . Smokeless tobacco: Never Used  . Alcohol Use: 0.0 oz/week    0 Not specified per week  . Drug Use: Yes    Special: Marijuana     Comment: "on weekends" per patient.  . Sexual Activity: Yes    Birth Control/ Protection: None     Comment: monogamous   Other Topics Concern  . None   Social History Narrative   ** Merged History Encounter **       Lives between [employer] and boyfriend's house , currently trying to abstain from illegal substances, continues to smoke and says nicotine patch made her sick.   Review of Systems: Pertinent items are noted in HPI. Objective:  Physical Exam: Filed Vitals:   01/15/15 0832  BP: 117/79  Pulse: 73  Temp: 97.9 F (36.6 C)  TempSrc: Oral  Height: _0  (1.575 m)  Weight: 171 lb 9.6 oz (77.837 kg)  SpO2: 99%   General: sitting in chair, NAD HEENT: PERRL, EOMI, no scleral icterus Cardiac: RRR, no rubs, murmurs or gallops PulmExpiratory wheezes throughout, prolonged expiratory phase, moving normal volumes of air Abd: soft, nontender, nondistended, BS present Ext: warm and well perfused, no pedal edema MSK: Ambulation with a slight limp and favoring of the left side, no foot drop, no foot dragging, FROM of right hip  and no tenderness in right groin, some slight tenderness in left groin and patient refuses to participate in range of motion or strength testing of left hip in fear of precipitating pain Neuro: alert and  oriented X3, cranial nerves II-XII grossly intact  Assessment & Plan:  Please see problem oriented charting  Pt discussed with Dr. Ellwood Dense

## 2015-01-15 NOTE — Progress Notes (Signed)
Internal Medicine Clinic Attending  Case discussed with Dr. Sadek at the time of the visit.  We reviewed the resident's history and exam and pertinent patient test results.  I agree with the assessment, diagnosis, and plan of care documented in the resident's note.  

## 2015-01-15 NOTE — Assessment & Plan Note (Signed)
This was incidental find recently at ED visit after fall. Pt is due for mammography.  -Screening mammogram

## 2015-01-15 NOTE — Patient Instructions (Signed)
General Instructions:   Please bring your medicines with you each time you come to clinic.  Medicines may include prescription medications, over-the-counter medications, herbal remedies, eye drops, vitamins, or other pills.   Progress Toward Treatment Goals:  No flowsheet data found.  Self Care Goals & Plans:  Self Care Goal 01/15/2015  Manage my medications take my medicines as prescribed; bring my medications to every visit; refill my medications on time; follow the sick day instructions if I am sick  Eat healthy foods eat more vegetables; eat fruit for snacks and desserts; eat baked foods instead of fried foods  Be physically active find an activity I enjoy    No flowsheet data found.   Care Management & Community Referrals:  No flowsheet data found.

## 2015-01-16 ENCOUNTER — Telehealth: Payer: Self-pay | Admitting: *Deleted

## 2015-01-16 NOTE — Telephone Encounter (Signed)
Talked with pt and she states she is taking pain meds like tic-tac's. She will go to ED for pain relief and MRI

## 2015-01-16 NOTE — Telephone Encounter (Signed)
Pt called stating she was seen in clinic yesterday for ongoing hip pain s/p fall. She was to f/u in 2 weeks if not better. Today pain is severe and she wants another injection. Pt # [id number]  I talked with Dr Algis Liming and she will order the MRI as out patient and take the pain meds she has at home. If pain is severe pt can go to ED for pain relief and imaging. Pt called back but no answer.  Message left.

## 2015-01-22 ENCOUNTER — Telehealth: Payer: Self-pay | Admitting: Internal Medicine

## 2015-01-22 NOTE — Telephone Encounter (Signed)
Call to patient to confirm appointment for 01/23/15 at 9:30. lmtcb

## 2015-01-23 ENCOUNTER — Ambulatory Visit: Payer: Medicaid Other

## 2015-01-30 ENCOUNTER — Ambulatory Visit: Payer: Medicaid Other | Attending: Internal Medicine

## 2015-01-30 ENCOUNTER — Other Ambulatory Visit: Payer: Self-pay | Admitting: *Deleted

## 2015-01-30 ENCOUNTER — Other Ambulatory Visit: Payer: Self-pay | Admitting: Internal Medicine

## 2015-01-30 DIAGNOSIS — K219 Gastro-esophageal reflux disease without esophagitis: Secondary | ICD-10-CM | POA: Insufficient documentation

## 2015-01-30 DIAGNOSIS — M25562 Pain in left knee: Secondary | ICD-10-CM

## 2015-01-30 DIAGNOSIS — M25552 Pain in left hip: Secondary | ICD-10-CM | POA: Insufficient documentation

## 2015-01-30 DIAGNOSIS — R262 Difficulty in walking, not elsewhere classified: Secondary | ICD-10-CM | POA: Insufficient documentation

## 2015-01-30 DIAGNOSIS — R29898 Other symptoms and signs involving the musculoskeletal system: Secondary | ICD-10-CM

## 2015-01-30 DIAGNOSIS — N63 Unspecified lump in unspecified breast: Secondary | ICD-10-CM

## 2015-01-30 DIAGNOSIS — J45909 Unspecified asthma, uncomplicated: Secondary | ICD-10-CM | POA: Insufficient documentation

## 2015-01-30 NOTE — Patient Instructions (Signed)
Issued a HEP to be done 2-3x/day for 2-3 reps for stretch and 10 reps for active exercise with 10-30 sec hold on stretch. She was issued trunk strech in sidebend and hip stretch with flexin and rotation and hamstring stretch, and hip flexor stretch and bridgeing d.

## 2015-01-30 NOTE — Therapy (Signed)
[place], [place], [address] Phone: [phone]   Fax:  [phone]  Physical Therapy Evaluation  Patient Details  Name: Kathleen [name] MRN: [id number] Date of Birth: [birth year] Referring Provider:  Clinton Gallant, MD  Encounter Date: 01/30/2015      PT End of Session - 01/30/15 1004    Visit Number 1   Number of Visits 1   Date for PT Re-Evaluation 01/30/15   PT Start Time 0925   PT Stop Time 1005   PT Time Calculation (min) 40 min   Activity Tolerance Patient tolerated treatment well;Patient limited by pain   Behavior During Therapy [facility] for tasks assessed/performed      Past Medical History  Diagnosis Date  . GERD (gastroesophageal reflux disease)   . Asthma     2 sets of PFT's in 04/09 without sign variability. Last set with significant decrease in FEV1 with saline alone, suggesting Asthma but  recommended clinical corelation   . Polysubstance abuse     none since March 17,2009.  Marland Kitchen Lower extremity edema     Neg ABI's, normal 2D echo, normal albumin  . Chronic headache   . Chronic pain     normal work up including TSH, RPR, B12, HIV, plain films, 2 ESR's, ANA, CK, RF along with routine CBC, CMET and UA. Further work up includes CRP, ESR, SPEP/UPEP, hepatitis erology, A1C , repeat ANA  . Bipolar 1 disorder     therapist is Caryl Asp and is followed by [facility]  . Blackout     negative work up including ESR, ANA, opthalmology referral, carotid dopplers, 2D echo, MRI and EEG.  . DUB (dysfunctional uterine bleeding)     and pelvic pain, with negative endometrial bx in 07/09 and transvaginal U/S significant for mild fibroids in 0/09.  . 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 Asthma   . Bronchitis   . Sleep apnea     NO CPAP  . Menorrhagia     Past Surgical History  Procedure Laterality Date  . Cesarean section    . Hernia repair    .  Left partial oophorectomy    . Cesarean section    . Oophorectomy      1/2 ovary removed  . Vagina surgery      mesh    LMP 11/08/2014  Visit Diagnosis:  Difficulty walking - Plan: PT plan of care cert/re-cert  Pain in joint involving lower leg, left - Plan: PT plan of care cert/re-cert  Weakness of left lower extremity - Plan: PT plan of care cert/re-cert      Subjective Assessment - 01/30/15 0928    Symptoms She report pain in LT hip and back   Pertinent History She reports a fall back when with dogs with foot caught in cable/leash.    Limitations Walking;Standing;House hold activities  lifting leg   How long can you sit comfortably? sits with weight off LT hip   How long can you stand comfortably? 20 min   How long can you walk comfortably? 10 min   Diagnostic tests xrays   Patient Stated Goals eleiminate pain   Currently in Pain? No/denies   Pain Score 5   8/10 with walking   Pain Location Hip  and back   Pain Descriptors / Indicators Sharp   Pain Type Acute pain  subacute   Pain Onset More than  a month ago   Pain Frequency Constant   Multiple Pain Sites No          [facility] PT Assessment - 01/30/15 0932    Assessment   Medical Diagnosis LT hip pain   Onset Date 12/09/14   Precautions   Precautions None   Restrictions   Weight Bearing Restrictions --  as tolerated   Balance Screen   Has the patient fallen in the past 6 months Yes  She will have MRI in future to assess other causes of fallin   How many times? 3   Has the patient had a decrease in activity level because of a fear of falling?  Yes  due to pain   Is the patient reluctant to leave their home because of a fear of falling?  Yes  due to pain   AROM   Overall AROM Comments LT SLR 40 degrees RT 70 degrees, active RT hip flexion to 100 degrees, passive RT hip rotation ER 45  IR 30 aduction 30.    Left Hip Flexion 90  passive 100   Left Hip External Rotation  20  passive 30   Left Hip Internal  Rotation  20  passive 30   Left Hip ABduction 25  passive 30   Left Hip ADduction 15  passive 25   Lumbar Flexion WNL   Lumbar Extension Decreased 90%   Lumbar - Right Side Bend decr 60%   Lumbar - Left Side Bend decr 75%   Strength   Overall Strength Comments She is weak in all LT LE groups reportingnd cogwheels pain    Palpation   Palpation LT ASIS more proximal, tender LT lower back and gluteals on LT .     Ambulation/Gait   Ambulation/Gait Yes   Assistive device None   Gait Pattern Decreased step length - left;Decreased stance time - left;Decreased stride length;Decreased hip/knee flexion - left;Decreased weight shift to left;Antalgic                          PT Education - 01/30/15 1004    Education provided Yes   Education Details HEP   Person(s) Educated Patient   Methods Explanation;Demonstration;Verbal cues;Handout   Comprehension Verbalized understanding;Returned demonstration             PT Long Term Goals - 01/30/15 1006    PT LONG TERM GOAL #1   Title Independent with HEP   Time 1   Period Days   Status Achieved               Plan - 01/30/15 1005    Clinical Impression Statement She is tender and painful with weakness and decreased range limiting all activity.    PT Frequency 1x / week   PT Next Visit Plan None . She is discharged due to finacial reasons. she did not opt for self pay.   PT Home Exercise Plan issued    Consulted and Agree with Plan of Care Patient         Problem List Patient Active Problem List   Diagnosis Date Noted  . Abnormal uterine bleeding 07/24/2014  . Left knee pain 06/18/2014  . Healthcare maintenance 06/18/2014  . Menorrhagia 05/24/2014  . Dyspareunia 05/24/2014  . Breast lump in female and tenderness 05/24/2014  . Tobacco use 05/24/2014  . Protein-calorie malnutrition, severe 05/15/2014  . Asthma 05/13/2014  . Tobacco use disorder 03/13/2013  . Neck pain on left  side 01/19/2012  .  DVT 12/21/2010  . ABNORMAL VAGINAL BLEEDING 12/21/2010  . BIPOLAR DISORDER UNSPECIFIED 01/21/2010  . GERD 01/21/2010  . CHEST PAIN, ATYPICAL 01/21/2010  . BACK PAIN 11/27/2008  . ARTHRITIS, GENERALIZED 10/08/2008  . SNORING 10/08/2008  . DISTURBANCE OF SKIN SENSATION 08/29/2008  . ASTHMA, UNSPECIFIED, UNSPECIFIED STATUS 08/08/2008  . HIP PAIN, LEFT 08/08/2008  . INSOMNIA 05/31/2008  . VISION LOSS, SUDDEN 03/25/2008  . BREAST PAIN, BILATERAL 03/25/2008  . BREAST LUMP 03/25/2008  . LEG PAIN, BILATERAL 03/19/2008  . HALLUCINATIONS 03/18/2008  . GENERALIZED ANXIETY DISORDER 03/14/2008  . DRUG ABUSE 03/14/2008  . MIGRAINE VARIANT 03/14/2008    Darrel Hoover PT 01/30/2015, 10:12 AM  [facility] [address] [place], [place], [address] Phone: [phone]   Fax:  [phone]

## 2015-02-05 ENCOUNTER — Ambulatory Visit (INDEPENDENT_AMBULATORY_CARE_PROVIDER_SITE_OTHER): Payer: Self-pay | Admitting: Internal Medicine

## 2015-02-05 ENCOUNTER — Encounter: Payer: Self-pay | Admitting: Internal Medicine

## 2015-02-05 VITALS — BP 109/66 | HR 79 | Temp 97.6°F | Ht 62.0 in | Wt 169.1 lb

## 2015-02-05 DIAGNOSIS — R232 Flushing: Secondary | ICD-10-CM | POA: Insufficient documentation

## 2015-02-05 DIAGNOSIS — H538 Other visual disturbances: Secondary | ICD-10-CM | POA: Insufficient documentation

## 2015-02-05 DIAGNOSIS — J45901 Unspecified asthma with (acute) exacerbation: Secondary | ICD-10-CM

## 2015-02-05 DIAGNOSIS — N951 Menopausal and female climacteric states: Secondary | ICD-10-CM

## 2015-02-05 DIAGNOSIS — M25552 Pain in left hip: Secondary | ICD-10-CM

## 2015-02-05 MED ORDER — OXYCODONE-ACETAMINOPHEN 5-325 MG PO TABS: ORAL_TABLET | ORAL | Status: DC

## 2015-02-05 MED ORDER — ALBUTEROL SULFATE HFA 108 (90 BASE) MCG/ACT IN AERS: 4.0000 | INHALATION_SPRAY | RESPIRATORY_TRACT | Status: DC | PRN

## 2015-02-05 MED ORDER — KETOROLAC TROMETHAMINE 60 MG/2ML IM SOLN
60.0000 mg | Freq: Once | INTRAMUSCULAR | Status: AC
  Administered 2015-02-05: 60 mg via INTRAMUSCULAR

## 2015-02-05 NOTE — Assessment & Plan Note (Signed)
Stable with on increased SOB. She requested a printed Rx for her albuterol inhaler and was given one during this visit.

## 2015-02-05 NOTE — Assessment & Plan Note (Signed)
Her pain has persisted for 2 months. DDx to include hairline fracture not seen in Xray, muscle tear, hematoma, or foreign object.  -Ordered MRI of left hip -She received Toradol 60mg  IM while here with some improvement of her pain -Refilled Percocet 5-325mg  #15 with no intention for long term tx explained to patient -Referral to Sports Medicine

## 2015-02-05 NOTE — Assessment & Plan Note (Signed)
She has had hot flashes for 2 yrs, has not tried OTC remedies.  -referred to GYN--pt also due for PAP smear

## 2015-02-05 NOTE — Patient Instructions (Signed)
General Instructions: -Please apply for the MAP program at the [facility] so you can get your medications at discounted prices.  -You may take Percocet as needed for the pain. You may also try Robaxin or Ibuprofen/Advil as needed for the pain in your left hip.  -They will call you with the appointment for the eye doctor.  -They will call you with the appointment for the Gynecologist.  -Follow up up with [place] as needed or in 6 months.   Please bring your medicines with you each time you come to clinic.  Medicines may include prescription medications, over-the-counter medications, herbal remedies, eye drops, vitamins, or other pills.   Progress Toward Treatment Goals:  No flowsheet data found.  Self Care Goals & Plans:  Self Care Goal 02/05/2015  Manage my medications bring my medications to every visit; take my medicines as prescribed; refill my medications on time; follow the sick day instructions if I am sick  Eat healthy foods eat more vegetables; eat fruit for snacks and desserts; eat foods that are low in salt; eat baked foods instead of fried foods  Be physically active find an activity I enjoy    No flowsheet data found.   Care Management & Community Referrals:  No flowsheet data found.

## 2015-02-05 NOTE — Progress Notes (Signed)
   Subjective:    Patient ID: Kathleen [name], female    DOB: 10-Jun-1961, 54 y.o.   MRN: [id number]  HPI Kathleen [name] is a 54 yr old woman with PMH of asthma, polysubstance abuse, migraines, breast mass, bipolar disorder type 1, who presents for evaluation of left hip pain that has been present since December 29th after she sustained a fall onto grass.  She was seen at the [facility] this month, on Feb 3, was given Rx for Robaxin but states that his medicine did not help her. She was given Toradol injection IM during that clinic visit with some improvement of her pain. She was also given a short course of Percocet and states that it did relieve her pain. She has run out of Percocet and is taking Aleve with no much relief of her symptoms.  She was seen by outpatient PT once and was given stretching exercises with no much improvement of her pain.  She reports that the pain interferes with her walking, sitting, and sleeping.  The pain is described as a constant ache, 8/10 to 10/10 today. She denies sciatic pain, loss of bowel/bladder control, numbness, weakness, or paraesthesia.  She notes that she did have a blue discoloration bruise in her proximal lateral left hip a few days after her fall but this faded away gradually the week after her fall.    Vision: She has had sudden vision loss that lasts for seconds, states that she no longer drives because of this. She has had this for years now, and it seems to be associated with migraine headache but she notes that now she has flashes of light immediately before having the transient vision loss. There is no family history of macular degeneration.   Hot flashes: She has had hot flashes for 2 yrs now, has not tried anything OTC for it.   Review of Systems  Constitutional: Negative for fever, chills, activity change, appetite change, fatigue and unexpected weight change.  Respiratory: Negative for cough and shortness of breath.   Cardiovascular: Negative for chest  pain and leg swelling.  Musculoskeletal: Positive for myalgias and arthralgias.       Left hip pain that radiates to her left buttocks.   Skin: Negative for color change and rash.  Neurological: Negative for dizziness and light-headedness.  Psychiatric/Behavioral: Negative for agitation.       Objective:   Physical Exam  Constitutional: She is oriented to person, place, and time. She appears well-developed and well-nourished. She appears distressed.  Mild distress due to pain  Cardiovascular: Normal rate.   Pulmonary/Chest: Effort normal. No respiratory distress.  Musculoskeletal: She exhibits tenderness. She exhibits no edema.  She has limited left hip extension/flexion due to pain. She has TTP at the lateral proximal left hip, there is a firmness to this are with no crepitus, no skin tear, no obvious deformities, no erythema/edema or bruise.  She is also mildly TTP over the left upper buttocks and lower flank with no CVA tenderness. There is no paraspinal tenderness to palpation with normal spine flexion and extension.   Neurological: She is alert and oriented to person, place, and time.  Skin: Skin is warm and dry. No rash noted. She is not diaphoretic.  Psychiatric: She has a normal mood and affect.  Nursing note and vitals reviewed.         Assessment & Plan:

## 2015-02-05 NOTE — Progress Notes (Signed)
Pt aware MRI at [facility] 02/17/15 12N. Hilda Blades Stephaney Steven RN 02/05/15 4PM

## 2015-02-05 NOTE — Assessment & Plan Note (Signed)
She has had sudden loss of vision for of a few seconds duration associated with migraines that has been present for years with negative work up in the past including Opthalmology eval, carotid Doppler, and MRI brain (see notes from 2012). However, she now describes a new symptoms of seeing flashing lights prior to the sudden vision loss episodes.  -referred to Ophthalmology

## 2015-02-06 ENCOUNTER — Other Ambulatory Visit: Payer: Self-pay

## 2015-02-06 MED ORDER — ALBUTEROL SULFATE HFA 108 (90 BASE) MCG/ACT IN AERS: 2.0000 | INHALATION_SPRAY | RESPIRATORY_TRACT | Status: DC | PRN

## 2015-02-06 NOTE — Addendum Note (Signed)
Addended by: Adele Barthel D on: 02/06/2015 11:18 AM   Modules accepted: Orders

## 2015-02-06 NOTE — Addendum Note (Signed)
Addended by: Adele Barthel D on: 02/06/2015 10:37 AM   Modules accepted: Orders

## 2015-02-06 NOTE — Progress Notes (Signed)
Internal Medicine Clinic Attending  Case discussed with Dr. Kennerly soon after the resident saw the patient.  We reviewed the resident's history and exam and pertinent patient test results.  I agree with the assessment, diagnosis, and plan of care documented in the resident's note.  

## 2015-02-07 ENCOUNTER — Telehealth: Payer: Self-pay | Admitting: Licensed Clinical Social Worker

## 2015-02-07 NOTE — Telephone Encounter (Signed)
Kathleen [name] was referred to CSW for information on applying for Disability.  Pt states she has applied in the past in 2009 and was denied.  Kathleen [name] states she appealed but received a continuance because she did not have an attorney.  Pt states she is in need of representation but no one will take her case.  CSW referred Kathleen [name] to [employer] [place].  In addition, CSW discussed transportation as pt had several upcoming appointments.  Pt states transportation would be a benefit.  CSW completed a [employer] transportation application along with 4 of Kathleen [name]'s upcoming appointments.  Pt will either be notified by CSW or [employer] directly.

## 2015-02-10 ENCOUNTER — Encounter: Payer: Self-pay | Admitting: Obstetrics & Gynecology

## 2015-02-10 ENCOUNTER — Ambulatory Visit: Payer: Medicaid Other | Admitting: Physical Therapy

## 2015-02-12 ENCOUNTER — Encounter: Payer: Self-pay | Admitting: Internal Medicine

## 2015-02-12 ENCOUNTER — Ambulatory Visit (INDEPENDENT_AMBULATORY_CARE_PROVIDER_SITE_OTHER): Payer: Self-pay | Admitting: Internal Medicine

## 2015-02-12 VITALS — BP 132/86 | HR 84 | Temp 97.7°F | Wt 169.5 lb

## 2015-02-12 DIAGNOSIS — M25552 Pain in left hip: Secondary | ICD-10-CM

## 2015-02-12 MED ORDER — OXYCODONE-ACETAMINOPHEN 5-325 MG PO TABS: ORAL_TABLET | ORAL | Status: DC

## 2015-02-12 MED ORDER — CYCLOBENZAPRINE HCL 10 MG PO TABS: 10.0000 mg | ORAL_TABLET | Freq: Three times a day (TID) | ORAL | Status: DC | PRN

## 2015-02-12 NOTE — Progress Notes (Signed)
Internal Medicine Clinic Attending  Case discussed with Dr. Glenn at the time of the visit.  We reviewed the resident's history and exam and pertinent patient test results.  I agree with the assessment, diagnosis, and plan of care documented in the resident's note.  

## 2015-02-12 NOTE — Patient Instructions (Signed)
Start taking the muscle relaxant Flexeril one tablet up to 3 times a day as needed.   This medication can make you drowsy so do not take if you have to drive or operate machinery.    Cyclobenzaprine tablets What is this medicine? CYCLOBENZAPRINE (sye kloe BEN za preen) is a muscle relaxer. It is used to treat muscle pain, spasms, and stiffness. This medicine may be used for other purposes; ask your health care provider or pharmacist if you have questions. COMMON BRAND NAME(S): Fexmid, Flexeril What should I tell my health care provider before I take this medicine? They need to know if you have any of these conditions: -heart disease, irregular heartbeat, or previous heart attack -liver disease -thyroid problem -an unusual or allergic reaction to cyclobenzaprine, tricyclic antidepressants, lactose, other medicines, foods, dyes, or preservatives -pregnant or trying to get pregnant -breast-feeding How should I use this medicine? Take this medicine by mouth with a glass of water. Follow the directions on the prescription label. If this medicine upsets your stomach, take it with food or milk. Take your medicine at regular intervals. Do not take it more often than directed. Talk to your pediatrician regarding the use of this medicine in children. Special care may be needed. Overdosage: If you think you have taken too much of this medicine contact a poison control center or emergency room at once. NOTE: This medicine is only for you. Do not share this medicine with others. What if I miss a dose? If you miss a dose, take it as soon as you can. If it is almost time for your next dose, take only that dose. Do not take double or extra doses. What may interact with this medicine? Do not take this medicine with any of the following medications: -certain medicines for fungal infections like fluconazole, itraconazole, ketoconazole, posaconazole,  voriconazole -cisapride -dofetilide -dronedarone -droperidol -flecainide -grepafloxacin -halofantrine -levomethadyl -MAOIs like Carbex, Eldepryl, Marplan, Nardil, and Parnate -nilotinib -pimozide -probucol -sertindole -thioridazine -ziprasidone This medicine may also interact with the following medications: -abarelix -alcohol -certain medicines for cancer -certain medicines for depression, anxiety, or psychotic disturbances -certain medicines for infection like alfuzosin, chloroquine, clarithromycin, levofloxacin, mefloquine, pentamidine, troleandomycin -certain medicines for an irregular heart beat -certain medicines used for sleep or numbness during surgery or procedure -contrast dyes -dolasetron -guanethidine -methadone -octreotide -ondansetron -other medicines that prolong the QT interval (cause an abnormal heart rhythm) -palonosetron -phenothiazines like chlorpromazine, mesoridazine, prochlorperazine, thioridazine -tramadol -vardenafil This list may not describe all possible interactions. Give your health care provider a list of all the medicines, herbs, non-prescription drugs, or dietary supplements you use. Also tell them if you smoke, drink alcohol, or use illegal drugs. Some items may interact with your medicine. What should I watch for while using this medicine? Check with your doctor or health care professional if your condition does not improve within 1 to 3 weeks. You may get drowsy or dizzy when you first start taking the medicine or change doses. Do not drive, use machinery, or do anything that may be dangerous until you know how the medicine affects you. Stand or sit up slowly. Your mouth may get dry. Drinking water, chewing sugarless gum, or sucking on hard candy may help. What side effects may I notice from receiving this medicine? Side effects that you should report to your doctor or health care professional as soon as possible: -allergic reactions like  skin rash, itching or hives, swelling of the face, lips, or tongue -chest pain -fast heartbeat -hallucinations -  seizures -vomiting Side effects that usually do not require medical attention (report to your doctor or health care professional if they continue or are bothersome): -headache This list may not describe all possible side effects. Call your doctor for medical advice about side effects. You may report side effects to FDA at 1-800-FDA-1088. Where should I keep my medicine? Keep out of the reach of children. Store at room temperature between 15 and 30 degrees C (59 and 86 degrees F). Keep container tightly closed. Throw away any unused medicine after the expiration date. NOTE: This sheet is a summary. It may not cover all possible information. If you have questions about this medicine, talk to your doctor, pharmacist, or health care provider.  2015, [employer]vier/[employer] Standard. (2013-06-26 12:48:19)

## 2015-02-12 NOTE — Progress Notes (Signed)
Patient ID: Kathleen [name], female   DOB: 1961-12-08, 54 y.o.   MRN: [id number]  Subjective:   Patient ID: Kathleen [name] female   DOB: Dec 02, 1961 54 y.o.   MRN: [id number]  HPI: [name] is a 54 y.o. F w/ PMH asthma, polysubstance abuse, migraines, breast mass, bipolar disorder presents for a f/u visit for her chronic pain.  She was seen in the clinic on 2/3 c/o left hip pain. Lumbar xrays and PT were ordered but the patient did not get the imaging, and she states that PT is not helping and cancelled her last appt. She was again seen on 2/24 for her hip pain. An MRI was ordered and is scheduled for 3/7 and she was referred to Sports Medicine and has an appt on 3/10. She states that her pain is constant and is not improved or worsened with anything. She states that it is a throbbing, pulling pain with tenderness of the skin from the mid-lower back to left hip down to her knee.   She denies fevers or chills, chest pain, abd pain, N/V/D, or trouble urinating.    Past Medical History  Diagnosis Date  . GERD (gastroesophageal reflux disease)   . Asthma     2 sets of PFT's in 04/09 without sign variability. Last set with significant decrease in FEV1 with saline alone, suggesting Asthma but  recommended clinical corelation   . Polysubstance abuse     none since March 17,2009.  Marland Kitchen Lower extremity edema     Neg ABI's, normal 2D echo, normal albumin  . Chronic headache   . Chronic pain     normal work up including TSH, RPR, B12, HIV, plain films, 2 ESR's, ANA, CK, RF along with routine CBC, CMET and UA. Further work up includes CRP, ESR, SPEP/UPEP, hepatitis erology, A1C , repeat ANA  . Bipolar 1 disorder     therapist is Caryl Asp and is followed by [facility]  . Blackout     negative work up including ESR, ANA, opthalmology referral, carotid dopplers, 2D echo, MRI and EEG.  . DUB (dysfunctional uterine bleeding)     and pelvic pain, with negative endometrial bx in 07/09 and  transvaginal U/S significant for mild fibroids in 0/09.  . 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 Asthma   . Bronchitis   . Sleep apnea     NO CPAP  . Menorrhagia    Current Outpatient Prescriptions  Medication Sig Dispense Refill  . divalproex (DEPAKOTE) 500 MG 24 hr tablet Take 2 tablets by mouth at bedtime.     . Fluticasone-Salmeterol (ADVAIR DISKUS) 250-50 MCG/DOSE AEPB Inhale 1 puff into the lungs 2 (two) times daily. 1 each 11  . ipratropium-albuterol (DUONEB) 0.5-2.5 (3) MG/3ML SOLN Take 3 mLs by nebulization every 6 (six) hours. 360 mL 12  . mometasone-formoterol (DULERA) 100-5 MCG/ACT AERO Inhale 2 puffs into the lungs 2 (two) times daily. 3 Inhaler 3  . oxyCODONE-acetaminophen (PERCOCET/ROXICET) 5-325 MG per tablet 1 to 2 tabs PO q6hrs  PRN for pain 30 tablet 0  . albuterol (PROVENTIL HFA;VENTOLIN HFA) 108 (90 BASE) MCG/ACT inhaler Inhale 2 puffs into the lungs every 4 (four) hours as needed for wheezing or shortness of breath. (Patient not taking: Reported on 02/12/2015) 3 Inhaler 3  . albuterol (PROVENTIL) (2.5 MG/3ML) 0.083% nebulizer solution Take 3 mLs (2.5 mg total) by nebulization every 6 (six) hours as  needed for wheezing. 75 mL 12  . cyclobenzaprine (FLEXERIL) 10 MG tablet Take 1 tablet (10 mg total) by mouth every 8 (eight) hours as needed for muscle spasms. 30 tablet 1   No current facility-administered medications for this visit.   Family History  Problem Relation Age of Onset  . Diabetes Father   . Hypertension Father   . Kidney disease Father   . Kidney disease Sister   . Colon cancer Mother 65  . Colon cancer Father 38  . Prostate cancer Father    History   Social History  . Marital Status: Single    Spouse Name: N/A  . Number of Children: N/A  . Years of Education: N/A   Social History Main Topics  . Smoking status: Former Smoker -- 0.25 packs/day for 30 years    Types: Cigarettes  . Smokeless tobacco:  Never Used  . Alcohol Use: 0.0 oz/week    0 Standard drinks or equivalent per week  . Drug Use: Yes    Special: Marijuana     Comment: "on weekends" per patient.  . Sexual Activity: Yes    Birth Control/ Protection: None     Comment: monogamous   Other Topics Concern  . None   Social History Narrative   ** Merged History Encounter **       Lives between [employer] and boyfriend's house , currently trying to abstain from illegal substances, continues to smoke and says nicotine patch made her sick.   Review of Systems: A 12 point ROS was performed; pertinent positives and negatives were noted in the HPI   Objective:  Physical Exam: Filed Vitals:   02/12/15 0854  BP: 132/86  Pulse: 84  Temp: 97.7 F (36.5 C)  TempSrc: Oral  Weight: 169 lb 8 oz (76.885 kg)  SpO2: 100%   Constitutional: Vital signs reviewed.  Patient is a well-developed and well-nourished female in no acute distress and cooperative with exam. Alert and oriented x3.  Head: Normocephalic and atraumatic Eyes: PERRL, EOMI Neck: Normal ROM Cardiovascular: Tachycardic, regular rhythm, no MRG Pulmonary/Chest: Normal respiratory effort, CTAB, no wheezes, rales, or rhonchi Abdominal: Soft. Non-tender, non-distended Musculoskeletal: No joint deformities. Decreased ROM at lumbar spine and left hip. Straight leg raise is 80 degrees. ROM is limited due to pain. Appears to have pain with ambulation, limping on left side. Neurological: A&O x3, cranial nerve II-XII are grossly intact, moves all 4 extremities.  Skin: Warm, dry and intact. Tenderness to light touch of lumbosacral spine, lateral left hip, anterior thigh and shin.   Psychiatric: Normal mood and affect. Speech and behavior is normal.   Assessment & Plan:   Please refer to Problem List based Assessment and Plan

## 2015-02-12 NOTE — Assessment & Plan Note (Signed)
Pain still present. Seems no worse than previously. MRI of the lumbar spine is scheduled for 3/7 and her Sports Medicine appt is 3/10. Strongly encouraged her to go to these appointments. She has significant tenderness to very light touch, so I wonder if there is a bit of exaggeration with her pain vs a neuropathic component. She does not have trigger point tenderness of fibromyalgia. She describes her pain as pulling and actually seemed to be having muscle spasms during the interview and exam. Refilling her percocet and trying flexeril (robaxin did not work in the past). - Percocet 5-325mg  1-2 tablet q6h PRN - Flexeril 10mg  TID PRN - Pt counseled on potential side effects of the medication and told not to take if she has to drive or operate machinery - Will schedule f/u with her PCP for 1 mo, may need pain contract

## 2015-02-13 ENCOUNTER — Ambulatory Visit
Admission: RE | Admit: 2015-02-13 | Discharge: 2015-02-13 | Disposition: A | Discharge status: Home / Self Care | Payer: Medicaid Other | Source: Ambulatory Visit | Attending: Internal Medicine | Admitting: Internal Medicine

## 2015-02-13 ENCOUNTER — Ambulatory Visit
Admission: RE | Admit: 2015-02-13 | Discharge: 2015-02-13 | Disposition: A | Discharge status: Home / Self Care | Payer: No Typology Code available for payment source | Source: Ambulatory Visit | Attending: Internal Medicine | Admitting: Internal Medicine

## 2015-02-13 DIAGNOSIS — N63 Unspecified lump in unspecified breast: Secondary | ICD-10-CM

## 2015-02-16 ENCOUNTER — Emergency Department (HOSPITAL_COMMUNITY)
Admission: EM | Admit: 2015-02-16 | Discharge: 2015-02-16 | Disposition: A | Discharge status: Home / Self Care | Payer: Medicaid Other | Attending: Emergency Medicine | Admitting: Emergency Medicine

## 2015-02-16 DIAGNOSIS — F319 Bipolar disorder, unspecified: Secondary | ICD-10-CM | POA: Insufficient documentation

## 2015-02-16 DIAGNOSIS — J45901 Unspecified asthma with (acute) exacerbation: Secondary | ICD-10-CM | POA: Insufficient documentation

## 2015-02-16 DIAGNOSIS — G8929 Other chronic pain: Secondary | ICD-10-CM | POA: Insufficient documentation

## 2015-02-16 DIAGNOSIS — Z8742 Personal history of other diseases of the female genital tract: Secondary | ICD-10-CM | POA: Insufficient documentation

## 2015-02-16 DIAGNOSIS — Z79899 Other long term (current) drug therapy: Secondary | ICD-10-CM | POA: Insufficient documentation

## 2015-02-16 DIAGNOSIS — Z87891 Personal history of nicotine dependence: Secondary | ICD-10-CM | POA: Insufficient documentation

## 2015-02-16 DIAGNOSIS — Z8719 Personal history of other diseases of the digestive system: Secondary | ICD-10-CM | POA: Insufficient documentation

## 2015-02-16 DIAGNOSIS — Z7951 Long term (current) use of inhaled steroids: Secondary | ICD-10-CM | POA: Insufficient documentation

## 2015-02-16 MED ORDER — ALBUTEROL (5 MG/ML) CONTINUOUS INHALATION SOLN
10.0000 mg/h | INHALATION_SOLUTION | Freq: Once | RESPIRATORY_TRACT | Status: AC
  Administered 2015-02-16: 10 mg/h via RESPIRATORY_TRACT
  Filled 2015-02-16: qty 20

## 2015-02-16 MED ORDER — ALBUTEROL SULFATE (2.5 MG/3ML) 0.083% IN NEBU
INHALATION_SOLUTION | RESPIRATORY_TRACT | Status: AC
  Filled 2015-02-16: qty 6

## 2015-02-16 MED ORDER — ALBUTEROL SULFATE (2.5 MG/3ML) 0.083% IN NEBU
5.0000 mg | INHALATION_SOLUTION | Freq: Once | RESPIRATORY_TRACT | Status: AC
  Administered 2015-02-16: 5 mg via RESPIRATORY_TRACT

## 2015-02-16 MED ORDER — PREDNISONE 20 MG PO TABS
60.0000 mg | ORAL_TABLET | Freq: Once | ORAL | Status: AC
  Administered 2015-02-16: 60 mg via ORAL
  Filled 2015-02-16: qty 3

## 2015-02-16 MED ORDER — ALBUTEROL SULFATE HFA 108 (90 BASE) MCG/ACT IN AERS
1.0000 | INHALATION_SPRAY | Freq: Four times a day (QID) | RESPIRATORY_TRACT | Status: DC | PRN
Start: 2015-02-16 — End: 2015-03-11

## 2015-02-16 MED ORDER — ALBUTEROL SULFATE HFA 108 (90 BASE) MCG/ACT IN AERS
2.0000 | INHALATION_SPRAY | RESPIRATORY_TRACT | Status: AC
  Administered 2015-02-16: 2 via RESPIRATORY_TRACT
  Filled 2015-02-16: qty 6.7

## 2015-02-16 MED ORDER — PREDNISONE 20 MG PO TABS: ORAL_TABLET | ORAL | Status: DC

## 2015-02-16 NOTE — ED Provider Notes (Signed)
CSN: [id number]     Arrival date & time 02/16/15  1521 History   First MD Initiated Contact with Patient 02/16/15 1622     No chief complaint on file.    (Consider location/radiation/quality/duration/timing/severity/associated sxs/prior Treatment) Patient is a 54 y.o. female presenting with shortness of breath. The history is provided by the patient.  Shortness of Breath Severity:  Mild Onset quality:  Gradual Duration:  1 day Timing:  Constant Progression:  Unchanged Chronicity:  New Context: URI   Relieved by:  Nothing Worsened by:  Nothing tried Ineffective treatments:  None tried Associated symptoms: cough   Associated symptoms: no abdominal pain, no chest pain, no fever, no headaches, no neck pain and no vomiting   Cough:    Cough characteristics:  Non-productive   Severity:  Mild   Onset quality:  Gradual   Timing:  Sporadic   Progression:  Unchanged   Chronicity:  New   Past Medical History  Diagnosis Date  . GERD (gastroesophageal reflux disease)   . Asthma     2 sets of PFT's in 04/09 without sign variability. Last set with significant decrease in FEV1 with saline alone, suggesting Asthma but  recommended clinical corelation   . Polysubstance abuse     none since March 17,2009.  Marland Kitchen Lower extremity edema     Neg ABI's, normal 2D echo, normal albumin  . Chronic headache   . Chronic pain     normal work up including TSH, RPR, B12, HIV, plain films, 2 ESR's, ANA, CK, RF along with routine CBC, CMET and UA. Further work up includes CRP, ESR, SPEP/UPEP, hepatitis erology, A1C , repeat ANA  . Bipolar 1 disorder     therapist is Caryl Asp and is followed by [facility]  . Blackout     negative work up including ESR, ANA, opthalmology referral, carotid dopplers, 2D echo, MRI and EEG.  . DUB (dysfunctional uterine bleeding)     and pelvic pain, with negative endometrial bx in 07/09 and transvaginal U/S significant for mild fibroids in 0/09.  . 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 Asthma   . Bronchitis   . Sleep apnea     NO CPAP  . Menorrhagia    Past Surgical History  Procedure Laterality Date  . Cesarean section    . Hernia repair    . Left partial oophorectomy    . Cesarean section    . Oophorectomy      1/2 ovary removed  . Vagina surgery      mesh   Family History  Problem Relation Age of Onset  . Diabetes Father   . Hypertension Father   . Kidney disease Father   . Kidney disease Sister   . Colon cancer Mother 2  . Colon cancer Father 29  . Prostate cancer Father    History  Substance Use Topics  . Smoking status: Former Smoker -- 0.25 packs/day for 30 years    Types: Cigarettes  . Smokeless tobacco: Never Used  . Alcohol Use: 0.0 oz/week    0 Standard drinks or equivalent per week   OB History    Gravida Para Term Preterm AB TAB SAB Ectopic Multiple Living   _0 0 1 1 0 0       Review of Systems  Constitutional: Negative for fever and fatigue.  HENT: Negative for congestion and drooling.   Eyes: Negative  for pain.  Respiratory: Positive for cough and shortness of breath.   Cardiovascular: Negative for chest pain.  Gastrointestinal: Negative for nausea, vomiting, abdominal pain and diarrhea.  Genitourinary: Negative for dysuria and hematuria.  Musculoskeletal: Negative for back pain, gait problem and neck pain.  Skin: Negative for color change.  Neurological: Negative for dizziness and headaches.  Hematological: Negative for adenopathy.  Psychiatric/Behavioral: Negative for behavioral problems.  All other systems reviewed and are negative.     Allergies  Topiramate and Tramadol  Home Medications   Prior to Admission medications   Medication Sig Start Date End Date Taking? Authorizing Provider  albuterol (PROVENTIL HFA;VENTOLIN HFA) 108 (90 BASE) MCG/ACT inhaler Inhale 2 puffs into the lungs every 4 (four) hours as needed for wheezing or shortness of  breath. 02/06/15  Yes Blain Pais, MD  Fluticasone-Salmeterol (ADVAIR) 250-50 MCG/DOSE AEPB Inhale 1 puff into the lungs 2 (two) times daily.   Yes Historical Provider, MD  albuterol (PROVENTIL) (2.5 MG/3ML) 0.083% nebulizer solution Take 3 mLs (2.5 mg total) by nebulization every 6 (six) hours as needed for wheezing. Patient not taking: Reported on 02/16/2015 01/19/12 01/18/13  Maitri S Kalia-Reynolds, DO  cyclobenzaprine (FLEXERIL) 10 MG tablet Take 1 tablet (10 mg total) by mouth every 8 (eight) hours as needed for muscle spasms. Patient not taking: Reported on 02/16/2015 02/12/15 02/12/16  Otho Bellows, MD  divalproex (DEPAKOTE) 500 MG 24 hr tablet Take 1,000 mg by mouth at bedtime.     Historical Provider, MD  Fluticasone-Salmeterol (ADVAIR DISKUS) 250-50 MCG/DOSE AEPB Inhale 1 puff into the lungs 2 (two) times daily. Patient not taking: Reported on 02/16/2015 01/19/12 02/12/15  Maitri S Kalia-Reynolds, DO  ipratropium-albuterol (DUONEB) 0.5-2.5 (3) MG/3ML SOLN Take 3 mLs by nebulization every 6 (six) hours. Patient taking differently: Take 3 mLs by nebulization every 6 (six) hours as needed (for shortness of breath.).  01/15/15   Clinton Gallant, MD  mometasone-formoterol [facility]) 100-5 MCG/ACT AERO Inhale 2 puffs into the lungs 2 (two) times daily. 01/15/15   Clinton Gallant, MD  oxyCODONE-acetaminophen (PERCOCET/ROXICET) 5-325 MG per tablet 1 to 2 tabs PO q6hrs  PRN for pain Patient not taking: Reported on 02/16/2015 02/12/15   Otho Bellows, MD   Pulse 102  Resp 26  SpO2 96% Physical Exam  Constitutional: She is oriented to person, place, and time. She appears well-developed and well-nourished.  HENT:  Head: Normocephalic.  Mouth/Throat: Oropharynx is clear and moist. No oropharyngeal exudate.  Eyes: Conjunctivae and EOM are normal. Pupils are equal, round, and reactive to light.  Neck: Normal range of motion. Neck supple.  Cardiovascular: Regular rhythm, normal heart sounds and intact distal pulses.  Exam  reveals no gallop and no friction rub.   No murmur heard. Pulmonary/Chest: Effort normal. No respiratory distress. She has wheezes (mild wheezing diffusely).  Abdominal: Soft. Bowel sounds are normal. There is no tenderness. There is no rebound and no guarding.  Musculoskeletal: Normal range of motion. She exhibits no edema or tenderness.  Neurological: She is alert and oriented to person, place, and time.  Skin: Skin is warm and dry.  Psychiatric: She has a normal mood and affect. Her behavior is normal.  Nursing note and vitals reviewed.   ED Course  Procedures (including critical care time) Labs Review Labs Reviewed - No data to display  Imaging Review No results found.   EKG Interpretation   Date/Time:  Sunday February 16 2015 17:03:11 EST Ventricular Rate:  84 PR Interval:  215 QRS Duration: 79 QT Interval:  412 QTC Calculation: 487 R Axis:   -59 Text Interpretation:  Sinus rhythm Probable left atrial enlargement LAD,  consider left anterior fascicular block Abnormal R-wave progression, early  transition Borderline prolonged QT interval Otherwise no significant  change Confirmed by Dalesha Stanback  MD, Taylon Louison ([id number]) on 02/16/2015 5:27:52 PM      MDM   Final diagnoses:  Asthma exacerbation    4:47 PM 54 y.o. female w hx of asthma who presents with shortness of breath since running out of her albuterol inhaler yesterday. She has had a mild cough but denies any fevers or chest pain. She is afebrile and vital signs are unremarkable here. Symptoms likely related to her running out of her albuterol inhaler. She states that she works for [employer] in a [employer] which typically exacerbates her asthma requiring frequent use of her inhaler. Will give her dose of prednisone and an albuterol treatment and reassess. Do not think further workup is needed.  6:26 PM: CTAB now. Cont to appear well. Prednisone and alb inhaler. I have discussed the diagnosis/risks/treatment options with the  patient and believe the pt to be eligible for discharge home to follow-up with her pcp as needed. We also discussed returning to the ED immediately if new or worsening sx occur. We discussed the sx which are most concerning (e.g., sob, cp, fever) that necessitate immediate return. Medications administered to the patient during their visit and any new prescriptions provided to the patient are listed below.  Medications given during this visit Medications  albuterol (PROVENTIL) (2.5 MG/3ML) 0.083% nebulizer solution 5 mg (5 mg Nebulization Given 02/16/15 1528)  albuterol (PROVENTIL,VENTOLIN) solution continuous neb (10 mg/hr Nebulization Given 02/16/15 1548)  predniSONE (DELTASONE) tablet 60 mg (60 mg Oral Given 02/16/15 1731)    New Prescriptions   ALBUTEROL (PROVENTIL HFA;VENTOLIN HFA) 108 (90 BASE) MCG/ACT INHALER    Inhale 1-2 puffs into the lungs every 6 (six) hours as needed for wheezing or shortness of breath.   PREDNISONE (DELTASONE) 20 MG TABLET    Date 2 tablets by mouth on day 1 and 2. Take 1 tablet by mouth on day 3.     Pamella Pert, MD 02/16/15 [phone]

## 2015-02-16 NOTE — Progress Notes (Signed)
Pt vomited during Albuterol tx. RT stopped Neb & called for RN & tech

## 2015-02-16 NOTE — Discharge Instructions (Signed)

## 2015-02-16 NOTE — ED Notes (Signed)
She feels markedly better.  She is no longer short of breath.

## 2015-02-16 NOTE — ED Notes (Signed)
Pt reports asthma attack x3 years, does not have inhaler. Lung sounds diminished.

## 2015-02-17 ENCOUNTER — Ambulatory Visit (HOSPITAL_COMMUNITY): Admission: RE | Admit: 2015-02-17 | Payer: Medicaid Other | Source: Ambulatory Visit

## 2015-02-20 ENCOUNTER — Encounter: Payer: Self-pay | Admitting: *Deleted

## 2015-02-20 ENCOUNTER — Ambulatory Visit: Payer: Self-pay | Admitting: Sports Medicine

## 2015-03-03 ENCOUNTER — Ambulatory Visit: Payer: Medicaid Other | Admitting: Family Medicine

## 2015-03-03 NOTE — Addendum Note (Signed)
Addended by: Hulan Fray on: 03/03/2015 07:08 PM   Modules accepted: Orders

## 2015-03-11 ENCOUNTER — Emergency Department (HOSPITAL_COMMUNITY)
Admission: EM | Admit: 2015-03-11 | Discharge: 2015-03-11 | Disposition: A | Discharge status: Home / Self Care | Payer: Medicaid Other | Attending: Emergency Medicine | Admitting: Emergency Medicine

## 2015-03-11 ENCOUNTER — Ambulatory Visit: Payer: Self-pay | Admitting: Internal Medicine

## 2015-03-11 ENCOUNTER — Emergency Department (HOSPITAL_COMMUNITY): Payer: Medicaid Other

## 2015-03-11 ENCOUNTER — Other Ambulatory Visit: Payer: Self-pay

## 2015-03-11 DIAGNOSIS — Z87891 Personal history of nicotine dependence: Secondary | ICD-10-CM | POA: Insufficient documentation

## 2015-03-11 DIAGNOSIS — Z8742 Personal history of other diseases of the female genital tract: Secondary | ICD-10-CM | POA: Insufficient documentation

## 2015-03-11 DIAGNOSIS — R6889 Other general symptoms and signs: Secondary | ICD-10-CM

## 2015-03-11 DIAGNOSIS — J45901 Unspecified asthma with (acute) exacerbation: Secondary | ICD-10-CM

## 2015-03-11 DIAGNOSIS — Z8719 Personal history of other diseases of the digestive system: Secondary | ICD-10-CM | POA: Insufficient documentation

## 2015-03-11 DIAGNOSIS — F319 Bipolar disorder, unspecified: Secondary | ICD-10-CM | POA: Insufficient documentation

## 2015-03-11 DIAGNOSIS — Z7951 Long term (current) use of inhaled steroids: Secondary | ICD-10-CM | POA: Insufficient documentation

## 2015-03-11 DIAGNOSIS — G8929 Other chronic pain: Secondary | ICD-10-CM | POA: Insufficient documentation

## 2015-03-11 DIAGNOSIS — R197 Diarrhea, unspecified: Secondary | ICD-10-CM | POA: Insufficient documentation

## 2015-03-11 DIAGNOSIS — R112 Nausea with vomiting, unspecified: Secondary | ICD-10-CM

## 2015-03-11 DIAGNOSIS — Z79899 Other long term (current) drug therapy: Secondary | ICD-10-CM | POA: Insufficient documentation

## 2015-03-11 LAB — CBC WITH DIFFERENTIAL/PLATELET
BASOS PCT: 0 % (ref 0–1)
Basophils Absolute: 0 10*3/uL (ref 0.0–0.1)
Eosinophils Absolute: 0 10*3/uL (ref 0.0–0.7)
Eosinophils Relative: 0 % (ref 0–5)
HCT: 38.7 % (ref 36.0–46.0)
HEMOGLOBIN: 12.5 g/dL (ref 12.0–15.0)
LYMPHS ABS: 0.5 10*3/uL — AB (ref 0.7–4.0)
LYMPHS PCT: 10 % — AB (ref 12–46)
MCH: 27.7 pg (ref 26.0–34.0)
MCHC: 32.3 g/dL (ref 30.0–36.0)
MCV: 85.6 fL (ref 78.0–100.0)
Monocytes Absolute: 0.6 10*3/uL (ref 0.1–1.0)
Monocytes Relative: 12 % (ref 3–12)
NEUTROS PCT: 78 % — AB (ref 43–77)
Neutro Abs: 4 10*3/uL (ref 1.7–7.7)
Platelets: 235 10*3/uL (ref 150–400)
RBC: 4.52 MIL/uL (ref 3.87–5.11)
RDW: 13.4 % (ref 11.5–15.5)
WBC: 5.1 10*3/uL (ref 4.0–10.5)

## 2015-03-11 LAB — COMPREHENSIVE METABOLIC PANEL
ALBUMIN: 3.7 g/dL (ref 3.5–5.2)
ALT: 22 U/L (ref 0–35)
ANION GAP: 11 (ref 5–15)
AST: 27 U/L (ref 0–37)
Alkaline Phosphatase: 55 U/L (ref 39–117)
BUN: 12 mg/dL (ref 6–23)
CO2: 23 mmol/L (ref 19–32)
Calcium: 8.8 mg/dL (ref 8.4–10.5)
Chloride: 103 mmol/L (ref 96–112)
Creatinine, Ser: 1.17 mg/dL — ABNORMAL HIGH (ref 0.50–1.10)
GFR calc Af Amer: 60 mL/min — ABNORMAL LOW (ref >90)
GFR calc non Af Amer: 52 mL/min — ABNORMAL LOW (ref >90)
Glucose, Bld: 87 mg/dL (ref 70–99)
Potassium: 3.2 mmol/L — ABNORMAL LOW (ref 3.5–5.1)
SODIUM: 137 mmol/L (ref 135–145)
TOTAL PROTEIN: 6.9 g/dL (ref 6.0–8.3)
Total Bilirubin: 0.4 mg/dL (ref 0.3–1.2)

## 2015-03-11 LAB — URINALYSIS, ROUTINE W REFLEX MICROSCOPIC
Bilirubin Urine: NEGATIVE
GLUCOSE, UA: NEGATIVE mg/dL
HGB URINE DIPSTICK: NEGATIVE
KETONES UR: NEGATIVE mg/dL
NITRITE: NEGATIVE
PROTEIN: NEGATIVE mg/dL
Specific Gravity, Urine: 1.006 (ref 1.005–1.030)
UROBILINOGEN UA: 0.2 mg/dL (ref 0.0–1.0)
pH: 6 (ref 5.0–8.0)

## 2015-03-11 LAB — TROPONIN I

## 2015-03-11 LAB — URINE MICROSCOPIC-ADD ON

## 2015-03-11 MED ORDER — KETOROLAC TROMETHAMINE 30 MG/ML IJ SOLN
30.0000 mg | Freq: Once | INTRAMUSCULAR | Status: AC
  Administered 2015-03-11: 30 mg via INTRAVENOUS
  Filled 2015-03-11: qty 1

## 2015-03-11 MED ORDER — METHYLPREDNISOLONE SODIUM SUCC 125 MG IJ SOLR
125.0000 mg | Freq: Once | INTRAMUSCULAR | Status: AC
  Administered 2015-03-11: 125 mg via INTRAVENOUS
  Filled 2015-03-11: qty 2

## 2015-03-11 MED ORDER — ALBUTEROL SULFATE (2.5 MG/3ML) 0.083% IN NEBU: 2.5000 mg | INHALATION_SOLUTION | Freq: Four times a day (QID) | RESPIRATORY_TRACT | Status: DC | PRN

## 2015-03-11 MED ORDER — IBUPROFEN 800 MG PO TABS: 800.0000 mg | ORAL_TABLET | Freq: Three times a day (TID) | ORAL | Status: DC | PRN

## 2015-03-11 MED ORDER — POTASSIUM CHLORIDE CRYS ER 20 MEQ PO TBCR
40.0000 meq | EXTENDED_RELEASE_TABLET | Freq: Once | ORAL | Status: AC
  Administered 2015-03-11: 40 meq via ORAL
  Filled 2015-03-11: qty 2

## 2015-03-11 MED ORDER — PROMETHAZINE HCL 25 MG PO TABS: 25.0000 mg | ORAL_TABLET | Freq: Four times a day (QID) | ORAL | Status: DC | PRN

## 2015-03-11 MED ORDER — PREDNISONE 20 MG PO TABS: 60.0000 mg | ORAL_TABLET | Freq: Every day | ORAL | Status: DC

## 2015-03-11 MED ORDER — ONDANSETRON HCL 4 MG/2ML IJ SOLN
4.0000 mg | Freq: Once | INTRAMUSCULAR | Status: AC
  Administered 2015-03-11: 4 mg via INTRAVENOUS
  Filled 2015-03-11: qty 2

## 2015-03-11 MED ORDER — SODIUM CHLORIDE 0.9 % IV BOLUS (SEPSIS)
1000.0000 mL | Freq: Once | INTRAVENOUS | Status: AC
  Administered 2015-03-11: 1000 mL via INTRAVENOUS

## 2015-03-11 MED ORDER — IPRATROPIUM-ALBUTEROL 0.5-2.5 (3) MG/3ML IN SOLN
3.0000 mL | Freq: Once | RESPIRATORY_TRACT | Status: AC
  Administered 2015-03-11: 3 mL via RESPIRATORY_TRACT
  Filled 2015-03-11: qty 3

## 2015-03-11 MED ORDER — ALBUTEROL SULFATE (2.5 MG/3ML) 0.083% IN NEBU
5.0000 mg | INHALATION_SOLUTION | Freq: Once | RESPIRATORY_TRACT | Status: AC
Start: 2015-03-11 — End: 2015-03-11
  Administered 2015-03-11: 5 mg via RESPIRATORY_TRACT
  Filled 2015-03-11: qty 6

## 2015-03-11 MED ORDER — ALBUTEROL SULFATE HFA 108 (90 BASE) MCG/ACT IN AERS: 2.0000 | INHALATION_SPRAY | RESPIRATORY_TRACT | Status: DC | PRN

## 2015-03-11 MED ORDER — BENZONATATE 100 MG PO CAPS: 100.0000 mg | ORAL_CAPSULE | Freq: Three times a day (TID) | ORAL | Status: DC | PRN

## 2015-03-11 MED ORDER — ACETAMINOPHEN 325 MG PO TABS
650.0000 mg | ORAL_TABLET | Freq: Four times a day (QID) | ORAL | Status: DC | PRN
  Administered 2015-03-11: 650 mg via ORAL
  Filled 2015-03-11 (×2): qty 2

## 2015-03-11 MED ORDER — GUAIFENESIN-CODEINE 100-10 MG/5ML PO SOLN
10.0000 mL | Freq: Once | ORAL | Status: AC
  Administered 2015-03-11: 10 mL via ORAL
  Filled 2015-03-11: qty 10

## 2015-03-11 NOTE — ED Provider Notes (Signed)
TIME SEEN: 3:40 PM  CHIEF COMPLAINT: Fever, cough, vomiting, diarrhea, chest pain  HPI: Patient is a 54 y.o. F with history of asthma, bipolar disorder, chronic pain who presents emergency department 3 days of fevers, bodyaches, nausea, vomiting, diarrhea, cough with yellow sputum production, wheezing. She is also complaining of chest pain that she describes as a tightness that is worse with coughing. No leaving factors. No shortness of breath. States her nephew was recently admitted to hospital in [place] with the flu. She did not have an influenza vaccination last year. She states she was recently around her nephew. No abdominal pain. No dysuria hematuria. She is menopausal. Denies rash.  ROS: See HPI Constitutional:  fever  Eyes: no drainage  ENT: no runny nose   Cardiovascular:   chest pain  Resp: no SOB  GI:  vomiting GU: no dysuria Integumentary: no rash  Allergy: no hives  Musculoskeletal: no leg swelling  Neurological: no slurred speech ROS otherwise negative  PAST MEDICAL HISTORY/PAST SURGICAL HISTORY:  Past Medical History  Diagnosis Date  . GERD (gastroesophageal reflux disease)   . Asthma     2 sets of PFT's in 04/09 without sign variability. Last set with significant decrease in FEV1 with saline alone, suggesting Asthma but  recommended clinical corelation   . Polysubstance abuse     none since March 17,2009.  Marland Kitchen Lower extremity edema     Neg ABI's, normal 2D echo, normal albumin  . Chronic headache   . Chronic pain     normal work up including TSH, RPR, B12, HIV, plain films, 2 ESR's, ANA, CK, RF along with routine CBC, CMET and UA. Further work up includes CRP, ESR, SPEP/UPEP, hepatitis erology, A1C , repeat ANA  . Bipolar 1 disorder     therapist is Caryl Asp and is followed by [facility]  . Blackout     negative work up including ESR, ANA, opthalmology referral, carotid dopplers, 2D echo, MRI and EEG.  . DUB (dysfunctional uterine bleeding)     and pelvic  pain, with negative endometrial bx in 07/09 and transvaginal U/S significant for mild fibroids in 0/09.  . 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 Asthma   . Bronchitis   . Sleep apnea     NO CPAP  . Menorrhagia     MEDICATIONS:  Prior to Admission medications   Medication Sig Start Date End Date Taking? Authorizing Provider  albuterol (PROVENTIL HFA;VENTOLIN HFA) 108 (90 BASE) MCG/ACT inhaler Inhale 2 puffs into the lungs every 4 (four) hours as needed for wheezing or shortness of breath. 02/06/15  Yes Blain Pais, MD  divalproex (DEPAKOTE) 500 MG 24 hr tablet Take 1,000 mg by mouth at bedtime.    Yes Historical Provider, MD  Fluticasone-Salmeterol (ADVAIR) 250-50 MCG/DOSE AEPB Inhale 1 puff into the lungs 2 (two) times daily.   Yes Historical Provider, MD  ipratropium-albuterol (DUONEB) 0.5-2.5 (3) MG/3ML SOLN Take 3 mLs by nebulization every 6 (six) hours. Patient taking differently: Take 3 mLs by nebulization every 6 (six) hours as needed (for shortness of breath.).  01/15/15  Yes Jerrye Noble, MD  albuterol (PROVENTIL HFA;VENTOLIN HFA) 108 (90 BASE) MCG/ACT inhaler Inhale 1-2 puffs into the lungs every 6 (six) hours as needed for wheezing or shortness of breath. 02/16/15   Pamella Pert, MD  albuterol (PROVENTIL) (2.5 MG/3ML) 0.083% nebulizer solution Take 3 mLs (2.5 mg total) by nebulization every  6 (six) hours as needed for wheezing. Patient not taking: Reported on 02/16/2015 01/19/12 01/18/13  Maitri S Kalia-Reynolds, DO  cyclobenzaprine (FLEXERIL) 10 MG tablet Take 1 tablet (10 mg total) by mouth every 8 (eight) hours as needed for muscle spasms. Patient not taking: Reported on 02/16/2015 02/12/15 02/12/16  Otho Bellows, MD  Fluticasone-Salmeterol (ADVAIR DISKUS) 250-50 MCG/DOSE AEPB Inhale 1 puff into the lungs 2 (two) times daily. Patient not taking: Reported on 02/16/2015 01/19/12 02/12/15  Maitri S Kalia-Reynolds, DO  mometasone-formoterol  (DULERA) 100-5 MCG/ACT AERO Inhale 2 puffs into the lungs 2 (two) times daily. 01/15/15   Jerrye Noble, MD  oxyCODONE-acetaminophen (PERCOCET/ROXICET) 5-325 MG per tablet 1 to 2 tabs PO q6hrs  PRN for pain Patient not taking: Reported on 02/16/2015 02/12/15   Otho Bellows, MD  predniSONE (DELTASONE) 20 MG tablet Date 2 tablets by mouth on day 1 and 2. Take 1 tablet by mouth on day 3. 02/17/15   Pamella Pert, MD    ALLERGIES:  Allergies  Allergen Reactions  . Topiramate Other (See Comments)    Reaction:  Hallucinations   . Tramadol Nausea Only    SOCIAL HISTORY:  History  Substance Use Topics  . Smoking status: Former Smoker -- 0.25 packs/day for 30 years    Types: Cigarettes  . Smokeless tobacco: Never Used  . Alcohol Use: 0.0 oz/week    0 Standard drinks or equivalent per week    FAMILY HISTORY: Family History  Problem Relation Age of Onset  . Diabetes Father   . Hypertension Father   . Kidney disease Father   . Kidney disease Sister   . Colon cancer Mother 11  . Colon cancer Father 46  . Prostate cancer Father     EXAM: BP 105/62 mmHg  Pulse 118  Temp(Src) 100 F (37.8 C) (Oral)  Resp 18  SpO2 96% CONSTITUTIONAL: Alert and oriented and responds appropriately to questions. Well-appearing; well-nourished, appears uncomfortable but is nontoxic  HEAD: Normocephalic EYES: Conjunctivae clear, PERRL ENT: normal nose; no rhinorrhea; moist mucous membranes; pharynx without lesions noted NECK: Supple, no meningismus, no LAD  CARD: Regular and tachycardic S1 and S2 appreciated; no murmurs, no clicks, no rubs, no gallops RESP: Normal chest excursion without splinting or tachypnea; breath sounds clear and equal bilaterally; no wheezes, no rhonchi, no rales,  no hypoxia or respiratory distress, good aeration diffusely, speaking full sentences  ABD/GI: Normal bowel sounds; non-distended; soft, non-tender, no rebound, no guarding, no peritoneal signs  BACK:  The back appears  normal and is non-tender to palpation, there is no CVA tenderness EXT: Normal ROM in all joints; non-tender to palpation; no edema; normal capillary refill; no cyanosis    SKIN: Normal color for age and race; warm, no rash NEURO: Moves all extremities equally PSYCH: The patient's mood and manner are appropriate. Grooming and personal hygiene are appropriate.  MEDICAL DECISION MAKING:  Pt here with fever, vomiting, diarrhea, cough, asthma exacerbation. Had a recent exposure to influenza. Lungs are now clear after receiving albuterol in triage. Chest x-ray clear. Patient does complain of chest pain. We'll obtain labs, EKG. We'll also obtain urinalysis. We'll treat symptomatically with Toradol, IVF, zofran, solumedrol, guaifenesin with codeine.    ED PROGRESS:  Patient's labs are unremarkable other than potassium of 3.2. We'll replace. Creatinine is mildly elevated and she has received IV fluids. Troponin negative. Chest x-ray clear. Urine does show large leukocytes but no other sign of infection. Culture is pending. She does not  have urinary symptoms. She is tolerating by mouth without difficulty. Lungs are clear to auscultation. Will discharge with prescription for albuterol, prednisone burst for her asthma exacerbation. We'll also discharge with prescription for Phenergan for nausea , ibuprofen for body aches an and prescription for Tessalon Perles for her cough. Suspect viral illness, possible influenza. Patient is outside of treatment window for Tamiflu. Discussed return precautions. She verbalized understanding and is comfortable with plan.      Date: 03/11/2015 16:23  Rate: 97  Rhythm: normal sinus rhythm  QRS Axis: LAD  Intervals: normal  ST/T Wave abnormalities: normal  Conduction Disutrbances: none  Narrative Interpretation: Left axis deviation, otherwise no ischemic changes or interval changes      [place], DO 03/11/15 1933

## 2015-03-11 NOTE — ED Notes (Signed)
Patient only wants to be stuck once. Wants [place] to wait on the IV to get blood.

## 2015-03-11 NOTE — ED Notes (Addendum)
Pt reports body aches and vomiting since yesterday, reports asthma attack starting 1 hour ago. Lung sounds clear. Pt has appointment for asthma at [facility] outpatient clinic at 1445 today. Pt reports she is out of inhaler.

## 2015-03-11 NOTE — Discharge Instructions (Signed)
Asthma Asthma is a recurring condition in which the airways tighten and narrow. Asthma can make it difficult to breathe. It can cause coughing, wheezing, and shortness of breath. Asthma episodes, also called asthma attacks, range from minor to life-threatening. Asthma cannot be cured, but medicines and lifestyle changes can help control it. CAUSES Asthma is believed to be caused by inherited (genetic) and environmental factors, but its exact cause is unknown. Asthma may be triggered by allergens, lung infections, or irritants in the air. Asthma triggers are different for each person. Common triggers include:   Animal dander.  Dust mites.  Cockroaches.  Pollen from trees or grass.  Mold.  Smoke.  Air pollutants such as dust, household cleaners, hair sprays, aerosol sprays, paint fumes, strong chemicals, or strong odors.  Cold air, weather changes, and winds (which increase molds and pollens in the air).  Strong emotional expressions such as crying or laughing hard.  Stress.  Certain medicines (such as aspirin) or types of drugs (such as beta-blockers).  Sulfites in foods and drinks. Foods and drinks that may contain sulfites include dried fruit, potato chips, and sparkling grape juice.  Infections or inflammatory conditions such as the flu, a cold, or an inflammation of the nasal membranes (rhinitis).  Gastroesophageal reflux disease (GERD).  Exercise or strenuous activity. SYMPTOMS Symptoms may occur immediately after asthma is triggered or many hours later. Symptoms include:  Wheezing.  Excessive nighttime or early morning coughing.  Frequent or severe coughing with a common cold.  Chest tightness.  Shortness of breath. DIAGNOSIS  The diagnosis of asthma is made by a review of your medical history and a physical exam. Tests may also be performed. These may include:  Lung function studies. These tests show how much air you breathe in and out.  Allergy  tests.  Imaging tests such as X-rays. TREATMENT  Asthma cannot be cured, but it can usually be controlled. Treatment involves identifying and avoiding your asthma triggers. It also involves medicines. There are 2 classes of medicine used for asthma treatment:   Controller medicines. These prevent asthma symptoms from occurring. They are usually taken every day.  Reliever or rescue medicines. These quickly relieve asthma symptoms. They are used as needed and provide short-term relief. Your health care provider will help you create an asthma action plan. An asthma action plan is a written plan for managing and treating your asthma attacks. It includes a list of your asthma triggers and how they may be avoided. It also includes information on when medicines should be taken and when their dosage should be changed. An action plan may also involve the use of a device called a peak flow meter. A peak flow meter measures how well the lungs are working. It helps you monitor your condition. HOME CARE INSTRUCTIONS   Take medicines only as directed by your health care provider. Speak with your health care provider if you have questions about how or when to take the medicines.  Use a peak flow meter as directed by your health care provider. Record and keep track of readings.  Understand and use the action plan to help minimize or stop an asthma attack without needing to seek medical care.  Control your home environment in the following ways to help prevent asthma attacks:  Do not smoke. Avoid being exposed to secondhand smoke.  Change your heating and air conditioning filter regularly.  Limit your use of fireplaces and wood stoves.  Get rid of pests (such as roaches and  mice) and their droppings.  Throw away plants if you see mold on them.  Clean your floors and dust regularly. Use unscented cleaning products.  Try to have someone else vacuum for you regularly. Stay out of rooms while they are  being vacuumed and for a short while afterward. If you vacuum, use a dust mask from a hardware store, a double-layered or microfilter vacuum cleaner bag, or a vacuum cleaner with a HEPA filter.  Replace carpet with wood, tile, or vinyl flooring. Carpet can trap dander and dust.  Use allergy-proof pillows, mattress covers, and box spring covers.  Wash bed sheets and blankets every week in hot water and dry them in a dryer.  Use blankets that are made of polyester or cotton.  Clean bathrooms and kitchens with bleach. If possible, have someone repaint the walls in these rooms with mold-resistant paint. Keep out of the rooms that are being cleaned and painted.  Wash hands frequently. SEEK MEDICAL CARE IF:   You have wheezing, shortness of breath, or a cough even if taking medicine to prevent attacks.  The colored mucus you cough up (sputum) is thicker than usual.  Your sputum changes from clear or white to yellow, green, gray, or bloody.  You have any problems that may be related to the medicines you are taking (such as a rash, itching, swelling, or trouble breathing).  You are using a reliever medicine more than 2-3 times per week.  Your peak flow is still at 50-79% of your personal best after following your action plan for 1 hour.  You have a fever. SEEK IMMEDIATE MEDICAL CARE IF:   You seem to be getting worse and are unresponsive to treatment during an asthma attack.  You are short of breath even at rest.  You get short of breath when doing very little physical activity.  You have difficulty eating, drinking, or talking due to asthma symptoms.  You develop chest pain.  You develop a fast heartbeat.  You have a bluish color to your lips or fingernails.  You are light-headed, dizzy, or faint.  Your peak flow is less than 50% of your personal best. MAKE SURE YOU:   Understand these instructions.  Will watch your condition.  Will get help right away if you are not  doing well or get worse. Document Released: 11/29/2005 Document Revised: 04/15/2014 Document Reviewed: 06/28/2013 [facility] Patient Information 2015 [place], [place]. This information is not intended to replace advice given to you by your health care provider. Make sure you discuss any questions you have with your health care provider.    Possible Influenza Influenza ("the flu") is a viral infection of the respiratory tract. It occurs more often in winter months because people spend more time in close contact with one another. Influenza can make you feel very sick. Influenza easily spreads from person to person (contagious). CAUSES  Influenza is caused by a virus that infects the respiratory tract. You can catch the virus by breathing in droplets from an infected person's cough or sneeze. You can also catch the virus by touching something that was recently contaminated with the virus and then touching your mouth, nose, or eyes. RISKS AND COMPLICATIONS You may be at risk for a more severe case of influenza if you smoke cigarettes, have diabetes, have chronic heart disease (such as heart failure) or lung disease (such as asthma), or if you have a weakened immune system. Elderly people and pregnant women are also at risk for more serious  infections. The most common problem of influenza is a lung infection (pneumonia). Sometimes, this problem can require emergency medical care and may be life threatening. SIGNS AND SYMPTOMS  Symptoms typically last 4 to 10 days and may include:  Fever.  Chills.  Headache, body aches, and muscle aches.  Sore throat.  Chest discomfort and cough.  Poor appetite.  Weakness or feeling tired.  Dizziness.  Nausea or vomiting. DIAGNOSIS  Diagnosis of influenza is often made based on your history and a physical exam. A nose or throat swab test can be done to confirm the diagnosis. TREATMENT  In mild cases, influenza goes away on its own. Treatment is directed at  relieving symptoms. For more severe cases, your health care provider may prescribe antiviral medicines to shorten the sickness. Antibiotic medicines are not effective because the infection is caused by a virus, not by bacteria. HOME CARE INSTRUCTIONS  Take medicines only as directed by your health care provider.  Use a cool mist humidifier to make breathing easier.  Get plenty of rest until your temperature returns to normal. This usually takes 3 to 4 days.  Drink enough fluid to keep your urine clear or pale yellow.  Cover yourmouth and nosewhen coughing or sneezing,and wash your handswellto prevent thevirusfrom spreading.  Stay homefromwork orschool untilthe fever is gonefor at least 85full day. PREVENTION  An annual influenza vaccination (flu shot) is the best way to avoid getting influenza. An annual flu shot is now routinely recommended for all adults in the [place] IF:  You experiencechest pain, yourcough worsens,or you producemore mucus.  Youhave nausea,vomiting, ordiarrhea.  Your fever returns or gets worse. SEEK IMMEDIATE MEDICAL CARE IF:  You havetrouble breathing, you become short of breath,or your skin ornails becomebluish.  You have severe painor stiffnessin the neck.  You develop a sudden headache, or pain in the face or ear.  You have nausea or vomiting that you cannot control. MAKE SURE YOU:   Understand these instructions.  Will watch your condition.  Will get help right away if you are not doing well or get worse. Document Released: 11/26/2000 Document Revised: 04/15/2014 Document Reviewed: 02/28/2012 [facility] Patient Information 2015 [place], [place]. This information is not intended to replace advice given to you by your health care provider. Make sure you discuss any questions you have with your health care provider.   Viral Infections A viral infection can be caused by different types of viruses.Most viral  infections are not serious and resolve on their own. However, some infections may cause severe symptoms and may lead to further complications. SYMPTOMS Viruses can frequently cause:  Minor sore throat.  Aches and pains.  Headaches.  Runny nose.  Different types of rashes.  Watery eyes.  Tiredness.  Cough.  Loss of appetite.  Gastrointestinal infections, resulting in nausea, vomiting, and diarrhea. These symptoms do not respond to antibiotics because the infection is not caused by bacteria. However, you might catch a bacterial infection following the viral infection. This is sometimes called a "superinfection." Symptoms of such a bacterial infection may include:  Worsening sore throat with pus and difficulty swallowing.  Swollen neck glands.  Chills and a high or persistent fever.  Severe headache.  Tenderness over the sinuses.  Persistent overall ill feeling (malaise), muscle aches, and tiredness (fatigue).  Persistent cough.  Yellow, green, or brown mucus production with coughing. HOME CARE INSTRUCTIONS   Only take over-the-counter or prescription medicines for pain, discomfort, diarrhea, or fever as directed by  your caregiver.  Drink enough water and fluids to keep your urine clear or pale yellow. Sports drinks can provide valuable electrolytes, sugars, and hydration.  Get plenty of rest and maintain proper nutrition. Soups and broths with crackers or rice are fine. SEEK IMMEDIATE MEDICAL CARE IF:   You have severe headaches, shortness of breath, chest pain, neck pain, or an unusual rash.  You have uncontrolled vomiting, diarrhea, or you are unable to keep down fluids.  You or your child has an oral temperature above 102 F (38.9 C), not controlled by medicine.  Your baby is older than 3 months with a rectal temperature of 102 F (38.9 C) or higher.  Your baby is 31 months old or younger with a rectal temperature of 100.4 F (38 C) or higher. MAKE SURE  YOU:   Understand these instructions.  Will watch your condition.  Will get help right away if you are not doing well or get worse. Document Released: 09/08/2005 Document Revised: 02/21/2012 Document Reviewed: 04/05/2011 [facility] Patient Information 2015 [place], [place]. This information is not intended to replace advice given to you by your health care provider. Make sure you discuss any questions you have with your health care provider.

## 2015-03-12 ENCOUNTER — Encounter: Payer: Self-pay | Admitting: Obstetrics & Gynecology

## 2015-03-13 LAB — URINE CULTURE

## 2015-03-18 ENCOUNTER — Telehealth: Payer: Self-pay | Admitting: Internal Medicine

## 2015-03-18 NOTE — Telephone Encounter (Signed)
Call to patient to confirm appointment for 03/19/15 at 3:45 lmtcb

## 2015-03-19 ENCOUNTER — Encounter: Payer: Self-pay | Admitting: Internal Medicine

## 2015-03-20 ENCOUNTER — Telehealth: Payer: Self-pay | Admitting: *Deleted

## 2015-03-20 NOTE — Telephone Encounter (Signed)
CALLED PATIENT LEFT VOICE MESSAGE FOR THE PATIENT TO RETURN CALL TO [phone].

## 2015-03-26 ENCOUNTER — Encounter: Payer: Self-pay | Admitting: Internal Medicine

## 2015-03-26 ENCOUNTER — Ambulatory Visit (INDEPENDENT_AMBULATORY_CARE_PROVIDER_SITE_OTHER): Payer: Self-pay | Admitting: Internal Medicine

## 2015-03-26 ENCOUNTER — Other Ambulatory Visit: Payer: Self-pay | Admitting: *Deleted

## 2015-03-26 VITALS — BP 122/75 | HR 85 | Temp 98.3°F | Ht 62.0 in | Wt 163.9 lb

## 2015-03-26 DIAGNOSIS — J45901 Unspecified asthma with (acute) exacerbation: Secondary | ICD-10-CM

## 2015-03-26 DIAGNOSIS — K219 Gastro-esophageal reflux disease without esophagitis: Secondary | ICD-10-CM

## 2015-03-26 MED ORDER — ALBUTEROL SULFATE HFA 108 (90 BASE) MCG/ACT IN AERS: 2.0000 | INHALATION_SPRAY | RESPIRATORY_TRACT | Status: DC | PRN

## 2015-03-26 MED ORDER — FLUTICASONE-SALMETEROL 250-50 MCG/DOSE IN AEPB: 1.0000 | INHALATION_SPRAY | Freq: Two times a day (BID) | RESPIRATORY_TRACT | Status: DC

## 2015-03-26 MED ORDER — OMEPRAZOLE MAGNESIUM 20 MG PO TBEC: 20.0000 mg | DELAYED_RELEASE_TABLET | Freq: Every day | ORAL | Status: DC

## 2015-03-26 MED ORDER — BENZONATATE 100 MG PO CAPS: 100.0000 mg | ORAL_CAPSULE | Freq: Three times a day (TID) | ORAL | Status: DC | PRN

## 2015-03-26 NOTE — Assessment & Plan Note (Signed)
Gave a prescription of Omeprazole per patient request.

## 2015-03-26 NOTE — Patient Instructions (Signed)
General Instructions: Please take your medications as prescribed Please be sure to try to get advair inhaler as it will help reduce asthma attacks frequency You can take over the counter omeprazole for acid reflux Please follow up with your primary doctor in 1-2 month   Please bring your medicines with you each time you come to clinic.  Medicines may include prescription medications, over-the-counter medications, herbal remedies, eye drops, vitamins, or other pills.   Progress Toward Treatment Goals:  No flowsheet data found.  Self Care Goals & Plans:  Self Care Goal 02/05/2015  Manage my medications bring my medications to every visit; take my medicines as prescribed; refill my medications on time; follow the sick day instructions if I am sick  Eat healthy foods eat more vegetables; eat fruit for snacks and desserts; eat foods that are low in salt; eat baked foods instead of fried foods  Be physically active find an activity I enjoy    No flowsheet data found.   Care Management & Community Referrals:  No flowsheet data found.

## 2015-03-26 NOTE — Progress Notes (Signed)
Patient ID: Kathleen [name], female   DOB: Apr 27, 1961, 54 y.o.   MRN: [id number]   Subjective:   HPI: KathleenAdaley DYNASTIE [name] is a 54 y.o. African-American woman with past medical history as listed below presents for an ER follow-up visit.  Reason(s) for this visit: Asthma exacerbation: She was evaluated in the ER on 03/11/2015 for asthma exacerbation. She responded well to nebulizing treatment and was discharged on Prednisone burst and Tessalon.  Since returning home, her symptoms have significantly improved. However, she requests for refill of the inhaler of albuterol and Tessalon pills. She has been unable to afford Advair. She reports that she gets several episodes of asthma attack at an average of 4 per month, including some happening at night time causing awakenings. She has not identified any triggers and there is no seasonal variation. Currently, the main complaint is cough and the stool has been controlled by Tessalon.  She also complains of acid reflux and she requests for refill of Omeprazole, which has helped previously.   Past Medical History  Diagnosis Date  . GERD (gastroesophageal reflux disease)   . Asthma   . Polysubstance abuse   . Lower extremity edema   . Chronic headache   . Chronic pain   . Bipolar 1 disorder   . Blackout   . DUB (dysfunctional uterine bleeding)   . Breast mass in female   . Asthma   . Bronchitis   . Sleep apnea     ROS: Constitutional: Denies fever, chills, diaphoresis, appetite change and fatigue.  Respiratory: Denies SOB, DOE, chest tightness, and wheezing. Denies chest pain. CVS: No chest pain, palpitations and leg swelling.  GI: No abdominal pain, nausea, vomiting, bloody stools. Acid reflux GU: No dysuria, frequency, hematuria, or flank pain.  MSK: No myalgias, back pain, joint swelling, arthralgias  Psych: No depression symptoms. No SI or SA.    Objective:  Physical Exam: Filed Vitals:   03/26/15 1448  BP: 122/75  Pulse: 85  Temp:  98.3 F (36.8 C)  TempSrc: Oral  Height: 5\' 2"  (1.575 m)  Weight: 163 lb 14.4 oz (74.345 kg)  SpO2: 98%   General: Well nourished. No acute distress. Coughs occasionally in he exam room HEENT: Normal oral mucosa. MMM.  Lungs: CTA bilaterally. No wheezing. Her breathing is not labored.  Heart: RRR; no extra sounds or murmurs  Abdomen: Non-distended, normal bowel sounds, soft, nontender; no hepatosplenomegaly  Extremities: No pedal edema. No joint swelling or tenderness. Neurologic: Normal EOM,  Alert and oriented x3. No obvious neurologic/cranial nerve deficits.  Assessment & Plan:  Discussed case with my attending in the clinic, Dr. Ellwood Dense. See problem based charting.

## 2015-03-26 NOTE — Progress Notes (Signed)
Case discussed with Dr. Kazibwe soon after the resident saw the patient.  We reviewed the resident's history and exam and pertinent patient test results.  I agree with the assessment, diagnosis, and plan of care documented in the resident's note. 

## 2015-03-26 NOTE — Assessment & Plan Note (Signed)
Symptoms are poorly control due to suboptimal therapy. She definitely needs to be on an inhaled corticosteroid. She cannot afford Advair. She uses albuterol nebulizer several times a day. However, her exacerbation seems to have abated after a course of PO Prednisone. Plan We have no samples of Advair at [facility]. Give her prescription of Advair to get it if she can afford it Give her sample of albuterol inhaler Medication Samples have been provided to the patient.  Drug name: Albuterol Qty: 1 inhaler  LOT: [id number]  Exp.Date: 08/2016  The patient has been instructed regarding the correct time, dose, and frequency of taking this medication, including desired effects and most common side effects.   Jessee Avers 5:19 PM 03/26/2015  Refilled tessalon per patient request.  Follow up with PCP in 1-2 months

## 2015-03-26 NOTE — Telephone Encounter (Signed)
Pt called - had to leave work early today due to cough and asthma - no inhaler. Appt made for 03/26/15 2:15PM Dr Alice Rieger. Needs a letter that pt can return to work. Pt has requested  refill on two medications. Hilda Blades Amayah Staheli RN 03/26/15 1:50PM

## 2015-04-14 NOTE — Addendum Note (Signed)
Addended by: Hulan Fray on: 04/14/2015 04:17 PM   Modules accepted: Orders

## 2015-04-27 ENCOUNTER — Encounter (HOSPITAL_COMMUNITY): Payer: Self-pay

## 2015-04-27 ENCOUNTER — Emergency Department (HOSPITAL_COMMUNITY)
Admission: EM | Admit: 2015-04-27 | Discharge: 2015-04-27 | Disposition: A | Discharge status: Home / Self Care | Payer: Medicaid Other | Attending: Emergency Medicine | Admitting: Emergency Medicine

## 2015-04-27 DIAGNOSIS — J45901 Unspecified asthma with (acute) exacerbation: Secondary | ICD-10-CM | POA: Insufficient documentation

## 2015-04-27 DIAGNOSIS — Z87891 Personal history of nicotine dependence: Secondary | ICD-10-CM | POA: Insufficient documentation

## 2015-04-27 DIAGNOSIS — Z7951 Long term (current) use of inhaled steroids: Secondary | ICD-10-CM | POA: Insufficient documentation

## 2015-04-27 DIAGNOSIS — K219 Gastro-esophageal reflux disease without esophagitis: Secondary | ICD-10-CM | POA: Insufficient documentation

## 2015-04-27 DIAGNOSIS — Z8742 Personal history of other diseases of the female genital tract: Secondary | ICD-10-CM | POA: Insufficient documentation

## 2015-04-27 DIAGNOSIS — Z8659 Personal history of other mental and behavioral disorders: Secondary | ICD-10-CM | POA: Insufficient documentation

## 2015-04-27 DIAGNOSIS — G8929 Other chronic pain: Secondary | ICD-10-CM | POA: Insufficient documentation

## 2015-04-27 DIAGNOSIS — Z79899 Other long term (current) drug therapy: Secondary | ICD-10-CM | POA: Insufficient documentation

## 2015-04-27 MED ORDER — IPRATROPIUM-ALBUTEROL 0.5-2.5 (3) MG/3ML IN SOLN
3.0000 mL | Freq: Once | RESPIRATORY_TRACT | Status: AC
  Administered 2015-04-27: 3 mL via RESPIRATORY_TRACT
  Filled 2015-04-27: qty 3

## 2015-04-27 MED ORDER — AZITHROMYCIN 250 MG PO TABS: 250.0000 mg | ORAL_TABLET | Freq: Every day | ORAL | Status: DC

## 2015-04-27 MED ORDER — ALBUTEROL SULFATE HFA 108 (90 BASE) MCG/ACT IN AERS
2.0000 | INHALATION_SPRAY | Freq: Once | RESPIRATORY_TRACT | Status: AC
  Administered 2015-04-27: 2 via RESPIRATORY_TRACT
  Filled 2015-04-27: qty 6.7

## 2015-04-27 MED ORDER — PREDNISONE 20 MG PO TABS: 40.0000 mg | ORAL_TABLET | Freq: Every day | ORAL | Status: DC

## 2015-04-27 MED ORDER — PREDNISONE 20 MG PO TABS
60.0000 mg | ORAL_TABLET | Freq: Once | ORAL | Status: AC
  Administered 2015-04-27: 60 mg via ORAL
  Filled 2015-04-27: qty 3

## 2015-04-27 NOTE — Discharge Instructions (Signed)
Take Prednisone as directed until gone. Take azithromycin as directed until gone. Return to the ED with worsening or concerning symptoms.

## 2015-04-27 NOTE — ED Provider Notes (Signed)
CSN: [id number]     Arrival date & time 04/27/15  1905 History   First MD Initiated Contact with Patient 04/27/15 1920     Chief Complaint  Patient presents with  . Asthma     (Consider location/radiation/quality/duration/timing/severity/associated sxs/prior Treatment) Patient is a 54 y.o. female presenting with wheezing. The history is provided by the patient. No language interpreter was used.  Wheezing Severity:  Severe Severity compared to prior episodes:  Similar Onset quality:  Sudden Duration:  1 day Timing:  Constant Progression:  Worsening Chronicity:  Recurrent Context: not animal exposure, not dust, not exercise, not exposure to allergen, not fumes, not medical treatments, not pet dander, not pollens, not smoke exposure and not tartrazine   Relieved by:  Nothing Worsened by:  Nothing tried Ineffective treatments:  None tried (patient is out of her inhaler) Associated symptoms: cough and shortness of breath   Associated symptoms: no chest pain, no fatigue and no fever   Risk factors: not exposed to toxic fumes, no prior hospitalizations, no prior ICU admissions, no prior intubations, no smoke inhalation and no suspected foreign body     Past Medical History  Diagnosis Date  . GERD (gastroesophageal reflux disease)   . Asthma     2 sets of PFT's in 04/09 without sign variability. Last set with significant decrease in FEV1 with saline alone, suggesting Asthma but  recommended clinical corelation   . Polysubstance abuse     none since March 17,2009.  Marland Kitchen Lower extremity edema     Neg ABI's, normal 2D echo, normal albumin  . Chronic headache   . Chronic pain     normal work up including TSH, RPR, B12, HIV, plain films, 2 ESR's, ANA, CK, RF along with routine CBC, CMET and UA. Further work up includes CRP, ESR, SPEP/UPEP, hepatitis erology, A1C , repeat ANA  . Bipolar 1 disorder     therapist is Caryl Asp and is followed by [facility]  . Blackout     negative work  up including ESR, ANA, opthalmology referral, carotid dopplers, 2D echo, MRI and EEG.  . DUB (dysfunctional uterine bleeding)     and pelvic pain, with negative endometrial bx in 07/09 and transvaginal U/S significant for mild fibroids in 0/09.  . 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 Asthma   . Bronchitis   . Sleep apnea     NO CPAP  . Menorrhagia    Past Surgical History  Procedure Laterality Date  . Cesarean section    . Hernia repair    . Left partial oophorectomy    . Cesarean section    . Oophorectomy      1/2 ovary removed  . Vagina surgery      mesh   Family History  Problem Relation Age of Onset  . Diabetes Father   . Hypertension Father   . Kidney disease Father   . Kidney disease Sister   . Colon cancer Mother 72  . Colon cancer Father 53  . Prostate cancer Father    History  Substance Use Topics  . Smoking status: Former Smoker -- 0.25 packs/day for 30 years    Types: Cigarettes  . Smokeless tobacco: Never Used  . Alcohol Use: 0.0 oz/week    0 Standard drinks or equivalent per week   OB History    Gravida Para Term Preterm AB TAB SAB Ectopic Multiple Living   3  2 2 0 1 1 0 0       Review of Systems  Constitutional: Negative for fever, chills and fatigue.  HENT: Negative for trouble swallowing.   Eyes: Negative for visual disturbance.  Respiratory: Positive for cough, shortness of breath and wheezing.   Cardiovascular: Negative for chest pain and palpitations.  Gastrointestinal: Negative for nausea, vomiting, abdominal pain and diarrhea.  Genitourinary: Negative for dysuria and difficulty urinating.  Musculoskeletal: Negative for arthralgias and neck pain.  Skin: Negative for color change.  Neurological: Negative for dizziness and weakness.  Psychiatric/Behavioral: Negative for dysphoric mood.      Allergies  Topiramate and Tramadol  Home Medications   Prior to Admission medications    Medication Sig Start Date End Date Taking? Authorizing Provider  albuterol (PROVENTIL HFA;VENTOLIN HFA) 108 (90 BASE) MCG/ACT inhaler Inhale 2 puffs into the lungs every 4 (four) hours as needed for wheezing or shortness of breath. 03/26/15  Yes Jessee Avers, MD  albuterol (PROVENTIL) (2.5 MG/3ML) 0.083% nebulizer solution Take 3 mLs (2.5 mg total) by nebulization every 6 (six) hours as needed for wheezing or shortness of breath. 03/11/15  Yes Kristen N Ward, DO  benzonatate (TESSALON) 100 MG capsule Take 1 capsule (100 mg total) by mouth 3 (three) times daily as needed for cough. 03/26/15  Yes Jessee Avers, MD  Fluticasone-Salmeterol (ADVAIR) 250-50 MCG/DOSE AEPB Inhale 1 puff into the lungs 2 (two) times daily. 03/26/15  Yes Jessee Avers, MD  oxyCODONE-acetaminophen (PERCOCET/ROXICET) 5-325 MG per tablet Take 1 tablet by mouth every 6 (six) hours as needed for severe pain (chest pain).   Yes Historical Provider, MD  albuterol (PROVENTIL) (2.5 MG/3ML) 0.083% nebulizer solution Take 3 mLs (2.5 mg total) by nebulization every 6 (six) hours as needed for wheezing. Patient not taking: Reported on 02/16/2015 01/19/12 01/18/13  Maitri S Kalia-Reynolds, DO  omeprazole (PRILOSEC OTC) 20 MG tablet Take 1 tablet (20 mg total) by mouth daily. Patient not taking: Reported on 04/27/2015 03/26/15 03/25/16  Jessee Avers, MD  promethazine (PHENERGAN) 25 MG tablet Take 1 tablet (25 mg total) by mouth every 6 (six) hours as needed for nausea or vomiting. Patient not taking: Reported on 03/26/2015 03/11/15   Kristen N Ward, DO   BP 125/90 mmHg  Pulse 76  Temp(Src) 98.7 F (37.1 C) (Oral)  Resp 22  SpO2 100% Physical Exam  Constitutional: She is oriented to person, place, and time. She appears well-developed and well-nourished. No distress.  HENT:  Head: Normocephalic and atraumatic.  Eyes: Conjunctivae and EOM are normal.  Neck: Normal range of motion.  Cardiovascular: Normal rate and regular rhythm.  Exam  reveals no gallop and no friction rub.   No murmur heard. Pulmonary/Chest: She has wheezes. She has no rales. She exhibits no tenderness.  Increased breathing effort. Inspiratory and expiratory wheezing noted in all lung fields.   Abdominal: Soft. She exhibits no distension. There is no tenderness. There is no rebound.  Musculoskeletal: Normal range of motion.  Neurological: She is alert and oriented to person, place, and time. Coordination normal.  Speech is goal-oriented. Moves limbs without ataxia.   Skin: Skin is warm and dry.  Psychiatric: She has a normal mood and affect. Her behavior is normal.  Nursing note and vitals reviewed.   ED Course  Procedures (including critical care time) Labs Review Labs Reviewed - No data to display  Imaging Review No results found.   EKG Interpretation None      MDM   Final diagnoses:  Asthma exacerbation    9:11 PM Patient given nebulizer treatment and prednisone. Vitals stable and patient afebrile.   Alvina Chou, PA-C 04/27/15 2354  Wandra Arthurs, MD 04/29/15 [phone]

## 2015-04-27 NOTE — ED Notes (Addendum)
Pt presents with c/o asthma. Pt reports she started wheezing yesterday. Pt is able to talk in complete sentences, but reports she is having difficulty breathing. Inspiratory wheezing heard on auscultation.

## 2015-05-28 ENCOUNTER — Encounter: Payer: Self-pay | Admitting: Internal Medicine

## 2015-07-21 ENCOUNTER — Emergency Department (HOSPITAL_COMMUNITY)
Admission: EM | Admit: 2015-07-21 | Discharge: 2015-07-21 | Disposition: A | Discharge status: Home / Self Care | Payer: Medicaid Other | Attending: Emergency Medicine | Admitting: Emergency Medicine

## 2015-07-21 ENCOUNTER — Emergency Department (HOSPITAL_COMMUNITY): Payer: Medicaid Other

## 2015-07-21 ENCOUNTER — Encounter (HOSPITAL_COMMUNITY): Payer: Self-pay

## 2015-07-21 DIAGNOSIS — Z87891 Personal history of nicotine dependence: Secondary | ICD-10-CM | POA: Insufficient documentation

## 2015-07-21 DIAGNOSIS — R0789 Other chest pain: Secondary | ICD-10-CM

## 2015-07-21 DIAGNOSIS — K219 Gastro-esophageal reflux disease without esophagitis: Secondary | ICD-10-CM | POA: Insufficient documentation

## 2015-07-21 DIAGNOSIS — J45901 Unspecified asthma with (acute) exacerbation: Secondary | ICD-10-CM

## 2015-07-21 DIAGNOSIS — F319 Bipolar disorder, unspecified: Secondary | ICD-10-CM | POA: Insufficient documentation

## 2015-07-21 DIAGNOSIS — J069 Acute upper respiratory infection, unspecified: Secondary | ICD-10-CM

## 2015-07-21 DIAGNOSIS — Z8742 Personal history of other diseases of the female genital tract: Secondary | ICD-10-CM | POA: Insufficient documentation

## 2015-07-21 DIAGNOSIS — Z79899 Other long term (current) drug therapy: Secondary | ICD-10-CM | POA: Insufficient documentation

## 2015-07-21 DIAGNOSIS — R079 Chest pain, unspecified: Secondary | ICD-10-CM

## 2015-07-21 DIAGNOSIS — G8929 Other chronic pain: Secondary | ICD-10-CM | POA: Insufficient documentation

## 2015-07-21 HISTORY — DX: Unspecified ovarian cyst, unspecified side: N83.209

## 2015-07-21 LAB — BASIC METABOLIC PANEL
ANION GAP: 6 (ref 5–15)
BUN: 12 mg/dL (ref 6–20)
CALCIUM: 9.1 mg/dL (ref 8.9–10.3)
CO2: 28 mmol/L (ref 22–32)
CREATININE: 0.99 mg/dL (ref 0.44–1.00)
Chloride: 106 mmol/L (ref 101–111)
Glucose, Bld: 95 mg/dL (ref 65–99)
Potassium: 3.9 mmol/L (ref 3.5–5.1)
Sodium: 140 mmol/L (ref 135–145)

## 2015-07-21 LAB — CBC WITH DIFFERENTIAL/PLATELET
BASOS ABS: 0 10*3/uL (ref 0.0–0.1)
Basophils Relative: 0 % (ref 0–1)
Eosinophils Absolute: 0.3 10*3/uL (ref 0.0–0.7)
Eosinophils Relative: 5 % (ref 0–5)
HEMATOCRIT: 42.3 % (ref 36.0–46.0)
Hemoglobin: 13.6 g/dL (ref 12.0–15.0)
LYMPHS PCT: 48 % — AB (ref 12–46)
Lymphs Abs: 3 10*3/uL (ref 0.7–4.0)
MCH: 27.1 pg (ref 26.0–34.0)
MCHC: 32.2 g/dL (ref 30.0–36.0)
MCV: 84.4 fL (ref 78.0–100.0)
Monocytes Absolute: 0.5 10*3/uL (ref 0.1–1.0)
Monocytes Relative: 8 % (ref 3–12)
NEUTROS PCT: 39 % — AB (ref 43–77)
Neutro Abs: 2.4 10*3/uL (ref 1.7–7.7)
PLATELETS: 270 10*3/uL (ref 150–400)
RBC: 5.01 MIL/uL (ref 3.87–5.11)
RDW: 13.7 % (ref 11.5–15.5)
WBC: 6.2 10*3/uL (ref 4.0–10.5)

## 2015-07-21 LAB — I-STAT TROPONIN, ED: Troponin i, poc: 0 ng/mL (ref 0.00–0.08)

## 2015-07-21 MED ORDER — KETOROLAC TROMETHAMINE 30 MG/ML IJ SOLN
30.0000 mg | Freq: Once | INTRAMUSCULAR | Status: AC
  Administered 2015-07-21: 30 mg via INTRAVENOUS
  Filled 2015-07-21: qty 1

## 2015-07-21 MED ORDER — BENZONATATE 100 MG PO CAPS: 100.0000 mg | ORAL_CAPSULE | Freq: Three times a day (TID) | ORAL | Status: DC

## 2015-07-21 MED ORDER — NAPROXEN 500 MG PO TABS: 500.0000 mg | ORAL_TABLET | Freq: Two times a day (BID) | ORAL | Status: DC

## 2015-07-21 MED ORDER — ALBUTEROL SULFATE (2.5 MG/3ML) 0.083% IN NEBU
5.0000 mg | INHALATION_SOLUTION | Freq: Once | RESPIRATORY_TRACT | Status: AC
  Administered 2015-07-21: 5 mg via RESPIRATORY_TRACT
  Filled 2015-07-21: qty 6

## 2015-07-21 MED ORDER — IPRATROPIUM BROMIDE 0.02 % IN SOLN
0.5000 mg | Freq: Once | RESPIRATORY_TRACT | Status: AC
  Administered 2015-07-21: 0.5 mg via RESPIRATORY_TRACT
  Filled 2015-07-21: qty 2.5

## 2015-07-21 MED ORDER — PREDNISONE 20 MG PO TABS: 60.0000 mg | ORAL_TABLET | Freq: Every day | ORAL | Status: DC

## 2015-07-21 MED ORDER — ALBUTEROL SULFATE HFA 108 (90 BASE) MCG/ACT IN AERS
2.0000 | INHALATION_SPRAY | RESPIRATORY_TRACT | Status: DC | PRN
  Administered 2015-07-21: 2 via RESPIRATORY_TRACT
  Filled 2015-07-21: qty 6.7

## 2015-07-21 MED ORDER — METHYLPREDNISOLONE SODIUM SUCC 125 MG IJ SOLR
125.0000 mg | Freq: Once | INTRAMUSCULAR | Status: AC
  Administered 2015-07-21: 125 mg via INTRAVENOUS
  Filled 2015-07-21: qty 2

## 2015-07-21 NOTE — ED Provider Notes (Signed)
CSN: [id number]     Arrival date & time 07/21/15  1048 History  This chart was scribed for non-physician practitioner, Hyman Bible, PA-C, working with Sharlett Iles, MD by Ladene Artist, ED Scribe. This patient was seen in room WTR5/WTR5 and the patient's care was started at 2:40 PM.   Chief Complaint  Patient presents with  . Shortness of Breath  . Chest Pain   The history is provided by the patient. No language interpreter was used.   HPI Comments: Kathleen [name] is a 54 y.o. female, with a h/o asthma, who presents to the Emergency Department complaining of persistent shortness of breath for the past 6 days. Pt reports that she ran out of her inhaler. She further reports associated productive cough and chest pain that is exacerbated with coughing for the past 6 days. She states that chest pain has resolved at this time. Pt also reports associated HA 6 days ago that has resolved.  She reports associated congestion.  She has not taken anything for her symptoms.   Pt denies nausea, fever, hemoptysis, neck pain. No cardiac history or h/o DVT/PE.  No exogenous estrogen use.  No prolonged travel or surgeries in the past 4 weeks.  No LE edema or pain.  Past Medical History  Diagnosis Date  . GERD (gastroesophageal reflux disease)   . Asthma     2 sets of PFT's in 04/09 without sign variability. Last set with significant decrease in FEV1 with saline alone, suggesting Asthma but  recommended clinical corelation   . Polysubstance abuse     none since March 17,2009.  Marland Kitchen Lower extremity edema     Neg ABI's, normal 2D echo, normal albumin  . Chronic headache   . Chronic pain     normal work up including TSH, RPR, B12, HIV, plain films, 2 ESR's, ANA, CK, RF along with routine CBC, CMET and UA. Further work up includes CRP, ESR, SPEP/UPEP, hepatitis erology, A1C , repeat ANA  . Bipolar 1 disorder     therapist is Caryl Asp and is followed by [facility]  . Blackout     negative work up  including ESR, ANA, opthalmology referral, carotid dopplers, 2D echo, MRI and EEG.  . DUB (dysfunctional uterine bleeding)     and pelvic pain, with negative endometrial bx in 07/09 and transvaginal U/S significant for mild fibroids in 0/09.  . 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 Asthma   . Bronchitis   . Sleep apnea     NO CPAP  . Menorrhagia   . Ovarian cyst    Past Surgical History  Procedure Laterality Date  . Cesarean section    . Hernia repair    . Left partial oophorectomy    . Cesarean section    . Oophorectomy      1/2 ovary removed  . Vagina surgery      mesh   Family History  Problem Relation Age of Onset  . Diabetes Father   . Hypertension Father   . Kidney disease Father   . Kidney disease Sister   . Colon cancer Mother 69  . Colon cancer Father 11  . Prostate cancer Father    History  Substance Use Topics  . Smoking status: Former Smoker -- 0.25 packs/day for 30 years    Types: Cigarettes  . Smokeless tobacco: Never Used  . Alcohol Use: No   OB History  Gravida Para Term Preterm AB TAB SAB Ectopic Multiple Living   _0 0 1 1 0 0       Review of Systems  Constitutional: Negative for fever.  Respiratory: Positive for cough and shortness of breath.   Cardiovascular: Positive for chest pain (resolved).  Gastrointestinal: Negative for nausea.  Musculoskeletal: Negative for neck pain.  Neurological: Positive for headaches (resolved).  All other systems reviewed and are negative.  Allergies  Topiramate and Tramadol  Home Medications   Prior to Admission medications   Medication Sig Start Date End Date Taking? Authorizing Provider  albuterol (PROVENTIL HFA;VENTOLIN HFA) 108 (90 BASE) MCG/ACT inhaler Inhale 2 puffs into the lungs every 4 (four) hours as needed for wheezing or shortness of breath. 03/26/15  Yes Jessee Avers, MD  albuterol (PROVENTIL) (2.5 MG/3ML) 0.083% nebulizer solution Take 3  mLs (2.5 mg total) by nebulization every 6 (six) hours as needed for wheezing or shortness of breath. 03/11/15  Yes Kristen N Ward, DO  azithromycin (ZITHROMAX Z-PAK) 250 MG tablet Take 1 tablet (250 mg total) by mouth daily. 514m PO day 1, then 2581mPO days 205 Patient not taking: Reported on 07/21/2015 04/27/15   KaAlvina ChouPA-C  benzonatate (TESSALON) 100 MG capsule Take 1 capsule (100 mg total) by mouth 3 (three) times daily as needed for cough. Patient not taking: Reported on 07/21/2015 03/26/15   RiJessee AversMD  Fluticasone-Salmeterol (ADVAIR) 250-50 MCG/DOSE AEPB Inhale 1 puff into the lungs 2 (two) times daily. Patient not taking: Reported on 07/21/2015 03/26/15   RiJessee AversMD  omeprazole (PRILOSEC OTC) 20 MG tablet Take 1 tablet (20 mg total) by mouth daily. Patient not taking: Reported on 04/27/2015 03/26/15 03/25/16  RiJessee AversMD  predniSONE (DELTASONE) 20 MG tablet Take 2 tablets (40 mg total) by mouth daily. Take 40 mg by mouth daily for 3 days, then 2040my mouth daily for 3 days, then 63m103mily for 3 days Patient not taking: Reported on 07/21/2015 04/27/15   KaitAlvina Chou-C  promethazine (PHENERGAN) 25 MG tablet Take 1 tablet (25 mg total) by mouth every 6 (six) hours as needed for nausea or vomiting. Patient not taking: Reported on 03/26/2015 03/11/15   Kristen N Ward, DO   BP 140/84 mmHg  Pulse 71  Temp(Src) 97.9 F (36.6 C) (Oral)  Resp 23  SpO2 100% Physical Exam  Constitutional: She is oriented to person, place, and time. She appears well-developed and well-nourished. No distress.  HENT:  Head: Normocephalic and atraumatic.  Right Ear: Tympanic membrane and ear canal normal.  Left Ear: Tympanic membrane and ear canal normal.  Mouth/Throat: Oropharynx is clear and moist.  Eyes: Conjunctivae and EOM are normal.  Neck: Normal range of motion. Neck supple. No tracheal deviation present.  Cardiovascular: Normal rate, regular rhythm and normal heart  sounds.   Pulmonary/Chest: Effort normal and breath sounds normal. No respiratory distress. She exhibits tenderness.  Patient speaking in complete sentences  Musculoskeletal: Normal range of motion.  Neurological: She is alert and oriented to person, place, and time.  Skin: Skin is warm and dry.  Psychiatric: She has a normal mood and affect. Her behavior is normal.  Nursing note and vitals reviewed.  ED Course  Procedures (including critical care time) DIAGNOSTIC STUDIES: Oxygen Saturation is 100% on RA, normal by my interpretation.    COORDINATION OF CARE: 2:44 PM-Discussed treatment plan which includes Toradol, nebulizer, CXR and diagnostic lab work with pt at bedside and pt agreed  to plan.   Labs Review Labs Reviewed  CBC WITH DIFFERENTIAL/PLATELET - Abnormal; Notable for the following:    Neutrophils Relative % 39 (*)    Lymphocytes Relative 48 (*)    All other components within normal limits  BASIC METABOLIC PANEL  I-STAT TROPOININ, ED   Imaging Review Dg Chest 2 View  07/21/2015   CLINICAL DATA:  Cough and congestion for 4 days, chest pain radiating to left arm  EXAM: CHEST - 2 VIEW  COMPARISON:  03/11/2015  FINDINGS: The heart size and mediastinal contours are within normal limits. Both lungs are clear but mildly hyperinflated. The visualized skeletal structures are unremarkable.  IMPRESSION: No change from the prior exam.   Electronically Signed   By: Inez Catalina M.D.   On: 07/21/2015 11:52    EKG Interpretation   Date/Time:  Monday July 21 2015 10:57:56 EDT Ventricular Rate:  65 PR Interval:  221 QRS Duration: 95 QT Interval:  481 QTC Calculation: 500 R Axis:   -23 Text Interpretation:  Sinus rhythm Prolonged PR interval Borderline left  axis deviation Low voltage, precordial leads Abnormal R-wave progression,  early transition Borderline prolonged QT interval Baseline wander in  lead(s) V4 ED PHYSICIAN INTERPRETATION AVAILABLE IN [facility]  Confirmed by  TEST, Record ([id number]) on 07/22/2015 7:18:53 AM      MDM   Final diagnoses:  Chest pain  Patient with a history of Asthma presents today with chest tightness, SOB, wheezing, and cough for the past 6 days.  She reports symptoms similar to previous Asthma exacerbations.  Symptoms improved after Solumedrol and breathing treatments.  Patient not hypoxic.  Patient also complaining of chest pain x 6 days.  Pain worse with coughing.  Chest wall tender to palpation.  Pain resolved after Toradol.  No risk factors for PE aside from age.  No ischemic changes on EKG.  Troponin negative.  Patient stable for discharge.  Return precautions given.   I personally performed the services described in this documentation, which was scribed in my presence. The recorded information has been reviewed and is accurate.    Hyman Bible, PA-C 07/22/15 [place], MD 07/23/15 [phone]

## 2015-07-21 NOTE — ED Notes (Signed)
Patiaent c/o left chest pain that radiates nto her left arm. Patiaent states her left arm feels numb. Patient also has a productive cough and expiratory wheezing x 4 days with green sputum.

## 2015-07-21 NOTE — ED Provider Notes (Signed)
MSE was initiated and I personally evaluated the patient and placed orders (if any) at  11:24 AM on July 21, 2015.  The patient appears stable so that the remainder of the MSE may be completed by another provider.  Patient briefly medically screened.  She has a history of Asthma and present today with SOB, wheezing, and chest tightness similar to prior asthma exacerbations.  Patient is tachypneic with diffuse wheezing on exam.  Will order IV Solumedrol and breathing treatment.   11:42 AM Patient is stating that her breathing has improved after first breathing treatment.  She does appear to be breathing more comfortably at this time.  Speaking in complete sentences.  She is now complaining of substernal chest pain and states that she feels that her left arm goes numb.  She reports that this has been going on for a couple of days.  Will order troponin and labs.   Hyman Bible, PA-C 07/21/15 [address] PA-C 07/22/15 [place], MD 07/23/15 [phone]

## 2015-07-21 NOTE — ED Notes (Signed)
Pt given snack per request, with EDPA verbal ok, tolerating without issue.

## 2015-07-21 NOTE — ED Notes (Signed)
Bed: WA19 Expected date:  Expected time:  Means of arrival:  Comments: Hold for triage  

## 2015-09-17 ENCOUNTER — Encounter (HOSPITAL_COMMUNITY): Payer: Self-pay | Admitting: *Deleted

## 2015-09-17 ENCOUNTER — Emergency Department (HOSPITAL_COMMUNITY)
Admission: EM | Admit: 2015-09-17 | Discharge: 2015-09-17 | Disposition: A | Discharge status: Home / Self Care | Payer: Medicaid Other | Attending: Emergency Medicine | Admitting: Emergency Medicine

## 2015-09-17 DIAGNOSIS — G8929 Other chronic pain: Secondary | ICD-10-CM | POA: Insufficient documentation

## 2015-09-17 DIAGNOSIS — K219 Gastro-esophageal reflux disease without esophagitis: Secondary | ICD-10-CM | POA: Insufficient documentation

## 2015-09-17 DIAGNOSIS — Z791 Long term (current) use of non-steroidal anti-inflammatories (NSAID): Secondary | ICD-10-CM | POA: Insufficient documentation

## 2015-09-17 DIAGNOSIS — Z7952 Long term (current) use of systemic steroids: Secondary | ICD-10-CM | POA: Insufficient documentation

## 2015-09-17 DIAGNOSIS — Z79899 Other long term (current) drug therapy: Secondary | ICD-10-CM | POA: Insufficient documentation

## 2015-09-17 DIAGNOSIS — Z8742 Personal history of other diseases of the female genital tract: Secondary | ICD-10-CM | POA: Insufficient documentation

## 2015-09-17 DIAGNOSIS — Z87891 Personal history of nicotine dependence: Secondary | ICD-10-CM | POA: Insufficient documentation

## 2015-09-17 DIAGNOSIS — J4541 Moderate persistent asthma with (acute) exacerbation: Secondary | ICD-10-CM | POA: Insufficient documentation

## 2015-09-17 DIAGNOSIS — Z8659 Personal history of other mental and behavioral disorders: Secondary | ICD-10-CM | POA: Insufficient documentation

## 2015-09-17 MED ORDER — ALBUTEROL SULFATE (2.5 MG/3ML) 0.083% IN NEBU: 2.5000 mg | INHALATION_SOLUTION | Freq: Four times a day (QID) | RESPIRATORY_TRACT | Status: DC | PRN

## 2015-09-17 MED ORDER — EPINEPHRINE 0.3 MG/0.3ML IJ SOAJ: 0.3000 mg | Freq: Once | INTRAMUSCULAR | Status: DC

## 2015-09-17 MED ORDER — ALBUTEROL SULFATE HFA 108 (90 BASE) MCG/ACT IN AERS
2.0000 | INHALATION_SPRAY | RESPIRATORY_TRACT | Status: DC | PRN
  Administered 2015-09-17: 2 via RESPIRATORY_TRACT
  Filled 2015-09-17: qty 6.7

## 2015-09-17 MED ORDER — ACETAMINOPHEN-CODEINE #3 300-30 MG PO TABS: 1.0000 | ORAL_TABLET | Freq: Four times a day (QID) | ORAL | Status: DC | PRN

## 2015-09-17 MED ORDER — PREDNISONE 20 MG PO TABS
60.0000 mg | ORAL_TABLET | Freq: Once | ORAL | Status: AC
  Administered 2015-09-17: 60 mg via ORAL
  Filled 2015-09-17: qty 3

## 2015-09-17 MED ORDER — PREDNISONE 20 MG PO TABS: 60.0000 mg | ORAL_TABLET | Freq: Every day | ORAL | Status: DC

## 2015-09-17 MED ORDER — ALBUTEROL SULFATE (2.5 MG/3ML) 0.083% IN NEBU
5.0000 mg | INHALATION_SOLUTION | Freq: Once | RESPIRATORY_TRACT | Status: AC
  Administered 2015-09-17: 5 mg via RESPIRATORY_TRACT
  Filled 2015-09-17: qty 6

## 2015-09-17 MED ORDER — IPRATROPIUM BROMIDE 0.02 % IN SOLN
0.5000 mg | Freq: Once | RESPIRATORY_TRACT | Status: AC
  Administered 2015-09-17: 0.5 mg via RESPIRATORY_TRACT
  Filled 2015-09-17: qty 2.5

## 2015-09-17 NOTE — ED Provider Notes (Signed)
CSN: [id number]     Arrival date & time 09/17/15  1531 History  By signing my name below, I, Meriel Pica, attest that this documentation has been prepared under the direction and in the presence of Domenic Moras, PA-C.  Electronically Signed: Meriel Pica, ED Scribe. 09/17/2015. 4:06 PM.  Chief Complaint  Patient presents with  . Asthma   The history is provided by the patient. No language interpreter was used.   HPI Comments: Kathleen [name] is a 54 y.o. female, with a PMhx of asthma and bronchitis, who presents to the Emergency Department complaining of an acute exacerbation of asthma onset 1 day ago with associated cough, dysphonia, SOB, wheezing, and difficulty swallowing. She reports she had an asthma attack at home last night which she attributes to a bee sting that occurred 2 days ago when she developed diffuse swelling and was having difficulty swallowing. Pt states she used all of her albuterol inhaler and albuterol nebulizer solution yesterday during her acute asthma exacerbation. She notes she has several triggers for her asthma and has attacks often. Pt has been hospitalized for asthma exacerbation in the past 10 years. No fevers or rashes noted. Denies abdominal pain.   Past Medical History  Diagnosis Date  . GERD (gastroesophageal reflux disease)   . Asthma     2 sets of PFT's in 04/09 without sign variability. Last set with significant decrease in FEV1 with saline alone, suggesting Asthma but  recommended clinical corelation   . Polysubstance abuse     none since March 17,2009.  Marland Kitchen Lower extremity edema     Neg ABI's, normal 2D echo, normal albumin  . Chronic headache   . Chronic pain     normal work up including TSH, RPR, B12, HIV, plain films, 2 ESR's, ANA, CK, RF along with routine CBC, CMET and UA. Further work up includes CRP, ESR, SPEP/UPEP, hepatitis erology, A1C , repeat ANA  . Bipolar 1 disorder [facility])     therapist is Caryl Asp and is followed by [employer]  .  Blackout     negative work up including ESR, ANA, opthalmology referral, carotid dopplers, 2D echo, MRI and EEG.  . DUB (dysfunctional uterine bleeding)     and pelvic pain, with negative endometrial bx in 07/09 and transvaginal U/S significant for mild fibroids in 0/09.  . 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 Asthma   . Bronchitis   . Sleep apnea     NO CPAP  . Menorrhagia   . Ovarian cyst    Past Surgical History  Procedure Laterality Date  . Cesarean section    . Hernia repair    . Left partial oophorectomy    . Cesarean section    . Oophorectomy      1/2 ovary removed  . Vagina surgery      mesh   Family History  Problem Relation Age of Onset  . Diabetes Father   . Hypertension Father   . Kidney disease Father   . Kidney disease Sister   . Colon cancer Mother 62  . Colon cancer Father 29  . Prostate cancer Father    Social History  Substance Use Topics  . Smoking status: Former Smoker -- 0.25 packs/day for 30 years    Types: Cigarettes  . Smokeless tobacco: Never Used  . Alcohol Use: No   OB History    Gravida Para Term Preterm AB  TAB SAB Ectopic Multiple Living   _0 0 1 1 0 0       Review of Systems  Constitutional: Negative for fever.  HENT: Positive for trouble swallowing and voice change.   Respiratory: Positive for cough, shortness of breath and wheezing.   Gastrointestinal: Negative for abdominal pain.  Skin: Negative for rash.   Allergies  Topiramate and Tramadol  Home Medications   Prior to Admission medications   Medication Sig Start Date End Date Taking? Authorizing Provider  albuterol (PROVENTIL HFA;VENTOLIN HFA) 108 (90 BASE) MCG/ACT inhaler Inhale 2 puffs into the lungs every 4 (four) hours as needed for wheezing or shortness of breath. 03/26/15   Jessee Avers, MD  albuterol (PROVENTIL) (2.5 MG/3ML) 0.083% nebulizer solution Take 3 mLs (2.5 mg total) by nebulization every 6 (six) hours  as needed for wheezing or shortness of breath. 03/11/15   Kristen N Ward, DO  azithromycin (ZITHROMAX Z-PAK) 250 MG tablet Take 1 tablet (250 mg total) by mouth daily. 532m PO day 1, then 2573mPO days 205 Patient not taking: Reported on 07/21/2015 04/27/15   KaAlvina ChouPA-C  benzonatate (TESSALON) 100 MG capsule Take 1 capsule (100 mg total) by mouth every 8 (eight) hours. 07/21/15   Heather Laisure, PA-C  Fluticasone-Salmeterol (ADVAIR) 250-50 MCG/DOSE AEPB Inhale 1 puff into the lungs 2 (two) times daily. Patient not taking: Reported on 07/21/2015 03/26/15   RiJessee AversMD  naproxen (NAPROSYN) 500 MG tablet Take 1 tablet (500 mg total) by mouth 2 (two) times daily. 07/21/15   Heather Laisure, PA-C  omeprazole (PRILOSEC OTC) 20 MG tablet Take 1 tablet (20 mg total) by mouth daily. Patient not taking: Reported on 04/27/2015 03/26/15 03/25/16  RiJessee AversMD  predniSONE (DELTASONE) 20 MG tablet Take 3 tablets (60 mg total) by mouth daily. 07/21/15   Heather Laisure, PA-C  promethazine (PHENERGAN) 25 MG tablet Take 1 tablet (25 mg total) by mouth every 6 (six) hours as needed for nausea or vomiting. Patient not taking: Reported on 03/26/2015 03/11/15   Kristen N Ward, DO   BP 116/75 mmHg  Pulse 91  Temp(Src) 98.4 F (36.9 C) (Oral)  Resp 18  SpO2 99% Physical Exam  Constitutional: She is oriented to person, place, and time. She appears well-developed and well-nourished.  HENT:  Head: Normocephalic and atraumatic.  Mouth/Throat: Oropharynx is clear and moist. No oropharyngeal exudate.  Pt is hoarse. Throat; uvula midline; no mucosal edema; no trismus.   Eyes: EOM are normal. Pupils are equal, round, and reactive to light.  Neck: Neck supple.  Mild anterior cervical LAD noted, trachea midline.   Cardiovascular: Normal rate, regular rhythm and normal heart sounds.   Pulmonary/Chest: Effort normal. No respiratory distress. She has wheezes.  Decreased breath sounds with faint expiratory  wheezes, no retractions.  Abdominal: Soft. There is no tenderness.  Musculoskeletal: Normal range of motion. She exhibits no edema.  Lymphadenopathy:    She has no cervical adenopathy.  Neurological: She is alert and oriented to person, place, and time. No cranial nerve deficit.  Skin: Skin is warm and dry.  No pruritic hives.   Psychiatric: She has a normal mood and affect.  Nursing note and vitals reviewed.   ED Course  Procedures  DIAGNOSTIC STUDIES: Oxygen Saturation is 99% on RA, normal by my interpretation.    COORDINATION OF CARE: 3:50 PM Discussed treatment plan with pt at bedside and pt agreed to plan. Will order and prescribe albuterol neb solution and  prednisone course.   After receiving breathing treatment, patient felt better, ambulating without difficulty. We'll refill her asthmatic medication. She also endorsed having low back pain which is chronic in nature, Tylenol 3 given for her pain. An epi-pen was also prescribed as patient did develop anaphylactic reaction to the insect bite. She understands return promptly to the ED if she develops similar symptoms.   MDM   Final diagnoses:  Asthma exacerbation attacks, moderate persistent    BP 104/82 mmHg  Pulse 72  Temp(Src) 98.4 F (36.9 C) (Oral)  Resp 18  SpO2 100%   I, Briella Hobday, personally performed the services described in this documentation. All medical record entries made by the scribe were at my direction and in my presence.  I have reviewed the chart and discharge instructions and agree that the record reflects my personal performance and is accurate and complete. Delfina Schreurs.  09/17/2015. 5:18 PM.      Domenic Moras, PA-C 09/17/15 1718  Debby Freiberg, MD 09/18/15 1536

## 2015-09-17 NOTE — ED Notes (Signed)
The pt walked the length of the hallway.  The pts sats dropped to only 97%  Most of the time they stayed at 100%

## 2015-09-17 NOTE — Discharge Instructions (Signed)

## 2015-09-17 NOTE — ED Notes (Signed)
The pt is hoarse and ran out of her med yesterday.  No respiratory distress at present

## 2015-09-17 NOTE — ED Notes (Signed)
Pt reports cough and asthma since yesterday and now out of her inhaler. Reports having a fever last night, spo2 99% at triage.

## 2015-09-18 ENCOUNTER — Telehealth: Payer: Self-pay | Admitting: Pharmacist

## 2015-09-18 DIAGNOSIS — J45901 Unspecified asthma with (acute) exacerbation: Secondary | ICD-10-CM

## 2015-09-18 MED ORDER — MOMETASONE FURO-FORMOTEROL FUM 100-5 MCG/ACT IN AERO: 2.0000 | INHALATION_SPRAY | Freq: Two times a day (BID) | RESPIRATORY_TRACT | Status: DC

## 2015-09-18 NOTE — Telephone Encounter (Signed)
Patient was seen in clinic with Bennye Alm, PharmD, PGY1 pharmacy resident. I agree with the assessment and plan of care documented.

## 2015-09-18 NOTE — Telephone Encounter (Addendum)
Called pt for Transitions of Care after ED visit on 09/17/15 for an asthma exacerbation.  Pt states is taking prednisone, albuterol inhaler, and albuterol nebulizer.  Pt states breathing has improved only a little bit since yesterday. States using nebulizer at 4 am and 9 am this morning but states has not improved her breathing.  Instructed pt to use Albuterol inhaler PRN SOB in addition to her albuterol nebs Q6H PRN.   Pt states cannot afford Advair. States has no insurance currently but does have orange card. Called Dr Redmond Pulling for transition to [facility].  Will send 1 mo supply of Dulera 100 mcg/65mcg 2 puffs BID.  Dr Redmond Pulling plans to f/u with Dr Marijean Bravo after pts [facility] visit tomorrow to assess the need for continued  ICS/LABA tx vs ICS tx alone. Will provide pt with Free Trial offer for [facility] and will send Rx to Medication assistance program.   Pt was also able to get her Epi Pen after she states she was stung by bee with hives, difficulty swallowing, and mouth swelling.   Pt states woke up this AM with headaches. Have been worsening throughout the day.  Instructed pt to f/u with PCP at IM clinic today or tomorrow for continuing asthma s/sx and headaches.  Pt scheduled f/u for 09/19/15. Will schedule Pharmacy clinic following PCP visit with Dr Marijean Bravo for inhaler technique training.

## 2015-09-19 ENCOUNTER — Ambulatory Visit (INDEPENDENT_AMBULATORY_CARE_PROVIDER_SITE_OTHER): Payer: Self-pay | Admitting: Internal Medicine

## 2015-09-19 ENCOUNTER — Telehealth: Payer: Self-pay | Admitting: Internal Medicine

## 2015-09-19 ENCOUNTER — Ambulatory Visit: Payer: Self-pay | Admitting: Pharmacist

## 2015-09-19 ENCOUNTER — Encounter: Payer: Self-pay | Admitting: Internal Medicine

## 2015-09-19 DIAGNOSIS — J45909 Unspecified asthma, uncomplicated: Secondary | ICD-10-CM | POA: Insufficient documentation

## 2015-09-19 DIAGNOSIS — F329 Major depressive disorder, single episode, unspecified: Secondary | ICD-10-CM

## 2015-09-19 DIAGNOSIS — J45901 Unspecified asthma with (acute) exacerbation: Secondary | ICD-10-CM

## 2015-09-19 DIAGNOSIS — F32A Depression, unspecified: Secondary | ICD-10-CM

## 2015-09-19 DIAGNOSIS — M25552 Pain in left hip: Secondary | ICD-10-CM

## 2015-09-19 MED ORDER — MOMETASONE FURO-FORMOTEROL FUM 100-5 MCG/ACT IN AERO
2.0000 | INHALATION_SPRAY | Freq: Two times a day (BID) | RESPIRATORY_TRACT | Status: DC
Start: 2015-09-19 — End: 2016-03-24

## 2015-09-19 MED ORDER — CITALOPRAM HYDROBROMIDE 20 MG PO TABS: 20.0000 mg | ORAL_TABLET | Freq: Every day | ORAL | Status: DC

## 2015-09-19 NOTE — Assessment & Plan Note (Addendum)
Had maximum PHQ-9 score of 27, which warrants SSRI therapy. She may have a history of bipolar disorder, but it appears depressive mood is the predominant feature. Despite her high-risk, he strong protective factors and predominantly passive suicidal ideation does not necessitate psychiatric hospitalization. I will give her a call later today to assess her mood again. - Start Celexa 20 mg - Advise her to go to [facility] before 3pm as a walk-in to hear about mental health services they offer - Close follow-up in a week, re-administer PHQ-9 - Call this evening to check in

## 2015-09-19 NOTE — Patient Instructions (Addendum)
Kathleen [name], it was a pleasure to meet you today. I will be starting you on a new medication for depression, Celexa. It may take a few weeks to work, and it may help with your pain symptoms. For your asthma, we are temporarily providing Dulera, but over the long-term, you will be need to start on Qvar. This will be able to be picked up at the health department. We are unable to provide an EpiPen at this time, but in the future, we can also arrange for you to pick it up at the health department. We will discuss options for management of your hip pain in a week. In the mean time, you may take Tylenol.  If you think you may hurt yourself, please seek medical attention immediately or call 911.

## 2015-09-19 NOTE — Assessment & Plan Note (Signed)
Appears to be a bronchitic component with her voice change over the past month. Symptoms are consistent with severe persistent asthma. We have arranged her to obtain [facility] at reduced/no cost at her pharmacy. However, a "step up" strategy with a long-term ICS might be the best approach. [name] for control of asthma - Long-term we can arrange her to pick up her medications at the [facility], at which point she will most likely benefit from Qvar with a prn bronchodilator instread of LABA/ICS therapy.

## 2015-09-19 NOTE — Assessment & Plan Note (Signed)
Patient did not obtain MRI or follow up with a pain contract as previously recommended. I am hesitant to start opioids for chronic pain given unlikely improvement in functional outcomes, opioid-induced hyperalgesia, and the development of dependence. Her exam is has previously described with significant tenderness to light touch. - Advise to take Tylenol for now - Explain that addressing her depression may improve her pain symptoms - Will discuss a long-term pain plan one week from now

## 2015-09-19 NOTE — Progress Notes (Signed)
   Subjective:    Patient ID: Kathleen [name], female    DOB: [birth year], 54 y.o.   MRN: [id number]  HPI  Kathleen [name] is a 54 year old woman with a PMH of asthma, polysubstance abuse who presents with unremitting asthma symptoms. She has had multiple ED visits in the past year for asthma exacerbations. She says her symptoms of dyspnea and wheezing occur continuously and bother her nightly. She says she is only taking her "nebulizer" at home and that she has run out of her other inhalers due to cost issues. She is a former smoker, no smokes in the home, and cannot think of a trigger for her asthma symptoms. She also complains of a depression over the past two years associated with reduced sleep, anhedonia, hopelessness, and suicide ideation.   Review of Systems  Constitutional: Positive for activity change, appetite change and fatigue. Negative for fever and unexpected weight change.  HENT: Negative for congestion, rhinorrhea, sneezing and sore throat.   Respiratory: Positive for cough, shortness of breath and wheezing.   Cardiovascular: Positive for leg swelling. Negative for chest pain and palpitations.  Gastrointestinal: Negative for nausea, vomiting, diarrhea and blood in stool.  Endocrine: Negative for cold intolerance and heat intolerance.  Musculoskeletal: Positive for back pain and arthralgias.  Neurological: Negative for dizziness.  Psychiatric/Behavioral: Positive for suicidal ideas, hallucinations, sleep disturbance, dysphoric mood and decreased concentration. The patient is nervous/anxious.        Objective:   Physical Exam  Constitutional: No distress.  HENT:  Mouth/Throat: Oropharynx is clear and moist. No oropharyngeal exudate.  Eyes: EOM are normal. Pupils are equal, round, and reactive to light.  Neck: No thyromegaly present.  Cardiovascular: Normal rate and regular rhythm.   No murmur heard. Pulmonary/Chest: Effort normal. She has wheezes.  Breathy voice  Abdominal: Soft.  Bowel sounds are normal. She exhibits no distension.  Musculoskeletal: She exhibits no edema.  Tenderness to palpation of left lower back and left hip.   Neurological: She is alert. She displays normal reflexes.  Psychiatric:  PHQ-9 score at maximum of 27 Passive suicide ideation presently, although has had a plan in the past P4 demonstrated elevated suicide risk, but she has strong protective factors in her grandchildren.  Vitals reviewed.         Assessment & Plan:  See problem-based assessment and plan for details.

## 2015-09-19 NOTE — Telephone Encounter (Signed)
I called Kathleen [name] this evening to check in on her mood. She was able to pick up her prescription of Celexa. She said her mood had actually improved a lot after coming to the clinic. She was unable to make it to [facility] this afternoon but said that she would go Monday to hear what services they offer. She was looking forward to coming to clinic next week.

## 2015-09-22 NOTE — Progress Notes (Signed)
Internal Medicine Clinic Attending  I saw and evaluated the patient.  I personally confirmed the key portions of the history and exam documented by Dr. Marijean Bravo and I reviewed pertinent patient test results.  The assessment, diagnosis, and plan were formulated together and I agree with the documentation in the resident's note.  Difficult case of uncontrolled depression with passive suicidal ideation. The patient does not have a clear plan to hurt herself, does not think she will hurt herself, and could identify several protective factors including her grandchildren. So we concluded she was severely depressed, but low risk for suicide. Plan to initiate treatment with SSRI today and follow up closely in one week. She has longterm problems obtaining medications because of no insurance. She is a candidate for the health department's medication assistance program, which could be her best solution going forward.

## 2015-10-29 ENCOUNTER — Telehealth: Payer: Self-pay | Admitting: Pharmacist

## 2015-11-05 NOTE — Telephone Encounter (Signed)
Contacted patient for follow up, unable to reach

## 2015-12-04 DIAGNOSIS — J45901 Unspecified asthma with (acute) exacerbation: Secondary | ICD-10-CM

## 2015-12-09 NOTE — Congregational Nurse Program (Signed)
Congregational Nurse Program Note  Date of Encounter: 12/04/2015  Past Medical History: Past Medical History  Diagnosis Date  . GERD (gastroesophageal reflux disease)   . Asthma     2 sets of PFT's in 04/09 without sign variability. Last set with significant decrease in FEV1 with saline alone, suggesting Asthma but  recommended clinical corelation   . Polysubstance abuse     none since March 17,2009.  Marland Kitchen Lower extremity edema     Neg ABI's, normal 2D echo, normal albumin  . Chronic headache   . Chronic pain     normal work up including TSH, RPR, B12, HIV, plain films, 2 ESR's, ANA, CK, RF along with routine CBC, CMET and UA. Further work up includes CRP, ESR, SPEP/UPEP, hepatitis erology, A1C , repeat ANA  . Bipolar 1 disorder [facility])     therapist is Caryl Asp and is followed by [employer]  . Blackout     negative work up including ESR, ANA, opthalmology referral, carotid dopplers, 2D echo, MRI and EEG.  . DUB (dysfunctional uterine bleeding)     and pelvic pain, with negative endometrial bx in 07/09 and transvaginal U/S significant for mild fibroids in 0/09.  . 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 Asthma   . Bronchitis   . Sleep apnea     NO CPAP  . Menorrhagia   . Ovarian cyst     Encounter Details:     CNP Questionnaire - 12/04/15 1449    Patient Demographics   Is this a new or existing patient? New   Patient is considered a/an Not Applicable   Patient Assistance   Patient's financial/insurance status [employer]s   Patient referred to apply for the following financial assistance Not Applicable   Food insecurities addressed Referred to food bank or resource   Transportation assistance No   Assistance securing medications No   Educational health offerings Not Applicable   Encounter Details   Primary purpose of visit Acute Illness/Condition Visit;Chronic Illness/Condition Visit   Was an Emergency  Department visit averted? Yes   Does patient have a medical provider? Yes   Patient referred to Establish PCP   Was a mental health screening completed? (GAINS tool) No   Does patient have dental issues? No   Since previous encounter, have you referred patient for abnormal blood pressure that resulted in a new diagnosis or medication change? No   Since previous encounter, have you referred patient for abnormal blood glucose that resulted in a new diagnosis or medication change? No   For Abstraction Use Only   Does patient have insurance? No     client was referred by the [facility] staff after client exhibiting difficulty breathing.  When I arrived, client had used her rescue inhaler and was beginning to breathe more easy.  Within five minutes client was breathing easier, voice sound normal.  Client states she is "fine now".

## 2015-12-10 ENCOUNTER — Emergency Department (HOSPITAL_COMMUNITY): Payer: Medicaid Other

## 2015-12-10 ENCOUNTER — Encounter (HOSPITAL_COMMUNITY): Payer: Self-pay | Admitting: *Deleted

## 2015-12-10 ENCOUNTER — Emergency Department (HOSPITAL_COMMUNITY)
Admission: EM | Admit: 2015-12-10 | Discharge: 2015-12-10 | Disposition: A | Discharge status: Home / Self Care | Payer: Medicaid Other | Attending: Physician Assistant | Admitting: Physician Assistant

## 2015-12-10 DIAGNOSIS — M25551 Pain in right hip: Secondary | ICD-10-CM

## 2015-12-10 DIAGNOSIS — J45909 Unspecified asthma, uncomplicated: Secondary | ICD-10-CM | POA: Insufficient documentation

## 2015-12-10 DIAGNOSIS — Z7951 Long term (current) use of inhaled steroids: Secondary | ICD-10-CM | POA: Insufficient documentation

## 2015-12-10 DIAGNOSIS — Z87891 Personal history of nicotine dependence: Secondary | ICD-10-CM | POA: Insufficient documentation

## 2015-12-10 DIAGNOSIS — G8929 Other chronic pain: Secondary | ICD-10-CM | POA: Insufficient documentation

## 2015-12-10 DIAGNOSIS — R079 Chest pain, unspecified: Secondary | ICD-10-CM

## 2015-12-10 DIAGNOSIS — F319 Bipolar disorder, unspecified: Secondary | ICD-10-CM | POA: Insufficient documentation

## 2015-12-10 DIAGNOSIS — Z8742 Personal history of other diseases of the female genital tract: Secondary | ICD-10-CM | POA: Insufficient documentation

## 2015-12-10 DIAGNOSIS — Z791 Long term (current) use of non-steroidal anti-inflammatories (NSAID): Secondary | ICD-10-CM | POA: Insufficient documentation

## 2015-12-10 DIAGNOSIS — H547 Unspecified visual loss: Secondary | ICD-10-CM

## 2015-12-10 DIAGNOSIS — Z8719 Personal history of other diseases of the digestive system: Secondary | ICD-10-CM | POA: Insufficient documentation

## 2015-12-10 DIAGNOSIS — H53123 Transient visual loss, bilateral: Secondary | ICD-10-CM | POA: Insufficient documentation

## 2015-12-10 DIAGNOSIS — Z79899 Other long term (current) drug therapy: Secondary | ICD-10-CM | POA: Insufficient documentation

## 2015-12-10 LAB — CBC
HCT: 40.5 % (ref 36.0–46.0)
Hemoglobin: 13.1 g/dL (ref 12.0–15.0)
MCH: 27.5 pg (ref 26.0–34.0)
MCHC: 32.3 g/dL (ref 30.0–36.0)
MCV: 85.1 fL (ref 78.0–100.0)
Platelets: 308 10*3/uL (ref 150–400)
RBC: 4.76 MIL/uL (ref 3.87–5.11)
RDW: 13.6 % (ref 11.5–15.5)
WBC: 6.9 10*3/uL (ref 4.0–10.5)

## 2015-12-10 LAB — BASIC METABOLIC PANEL
ANION GAP: 7 (ref 5–15)
BUN: 11 mg/dL (ref 6–20)
CHLORIDE: 106 mmol/L (ref 101–111)
CO2: 27 mmol/L (ref 22–32)
CREATININE: 1.01 mg/dL — AB (ref 0.44–1.00)
Calcium: 9.2 mg/dL (ref 8.9–10.3)
GFR calc Af Amer: >60 mL/min (ref >60)
GFR calc non Af Amer: >60 mL/min (ref >60)
Glucose, Bld: 72 mg/dL (ref 65–99)
Potassium: 3.9 mmol/L (ref 3.5–5.1)
SODIUM: 140 mmol/L (ref 135–145)

## 2015-12-10 LAB — I-STAT TROPONIN, ED: TROPONIN I, POC: 0 ng/mL (ref 0.00–0.08)

## 2015-12-10 MED ORDER — OXYCODONE-ACETAMINOPHEN 5-325 MG PO TABS
1.0000 | ORAL_TABLET | Freq: Once | ORAL | Status: AC
  Administered 2015-12-10: 1 via ORAL
  Filled 2015-12-10: qty 1

## 2015-12-10 MED ORDER — IPRATROPIUM-ALBUTEROL 0.5-2.5 (3) MG/3ML IN SOLN
3.0000 mL | Freq: Once | RESPIRATORY_TRACT | Status: AC
  Administered 2015-12-10: 3 mL via RESPIRATORY_TRACT
  Filled 2015-12-10: qty 3

## 2015-12-10 MED ORDER — GADOBENATE DIMEGLUMINE 529 MG/ML IV SOLN
15.0000 mL | Freq: Once | INTRAVENOUS | Status: AC | PRN
  Administered 2015-12-10: 15 mL via INTRAVENOUS

## 2015-12-10 MED ORDER — OXYCODONE-ACETAMINOPHEN 5-325 MG PO TABS: 1.0000 | ORAL_TABLET | ORAL | Status: DC | PRN

## 2015-12-10 NOTE — ED Notes (Signed)
Delay in breathing treatment due to pt being taken to [facility]

## 2015-12-10 NOTE — Discharge Instructions (Signed)
1. Medications: Percocet for hip pain - This can make you very drowsy - please do not drink or drive on this medication, continue usual home medications, nebulizer treatments at home as usual for wheezing.  2. Treatment: rest, drink plenty of fluids 3. Follow Up: Follow up with the neurology clinic listed above. Please follow up with your primary doctor in 3-5 days for discussion of your diagnoses and further evaluation after today's visit; Please return to the ER for any new or worsening symptoms, any additional concerns.

## 2015-12-10 NOTE — ED Notes (Signed)
Pt states she does not have a reaction to Oxycodone

## 2015-12-10 NOTE — ED Notes (Signed)
Pt transported to MRI 

## 2015-12-10 NOTE — ED Notes (Signed)
Pt complains of intermittent chest pain for the past 2 months, pt states she feels the pain in her left chest radiating down her left arm. Pt states her arm feels heavy. Pt also complains of pain in her hips/upper thighs bilaterally. Pt states her thighs are tender to touch. Pt states she has hx of asthma and has been "hyperventilating all day". Pt denies nausea. Pt states she also has had intermittent blurred vision for the past 2 months.

## 2015-12-10 NOTE — ED Notes (Signed)
Pt returned from X Ray.

## 2015-12-10 NOTE — ED Notes (Signed)
MD Cook at bedside. 

## 2015-12-10 NOTE — ED Provider Notes (Signed)
CSN: [id number]     Arrival date & time 12/10/15  1110 History   None    Chief Complaint  Patient presents with  . Chest Pain  . Hip Pain     (Consider location/radiation/quality/duration/timing/severity/associated sxs/prior Treatment) The history is provided by the patient and medical records. No language interpreter was used.     Kathleen [name] is a 55 y.o. female  with a PMH of asthma, polysubstance abuse, chronic pain, and bipolar disorder who presents to the Emergency Department with right hip pain x 2 weeks and intermittent, left-sided sharp chest pain x 2-3 months. Pt. Also states she has had approx. 10 episodes of transient bilateral vision loss, lasting approx. 25-30 seconds over the last week - two episodes today- associated with left arm "heaviness" At this time, these symptoms have resolved and only complaint is right hip pain. No hx of HTN, HLD, father died of heart problems at 35, mother died of MI at 65, former smoker who recently quit 6 months ago.    Past Medical History  Diagnosis Date  . GERD (gastroesophageal reflux disease)   . Asthma     2 sets of PFT's in 04/09 without sign variability. Last set with significant decrease in FEV1 with saline alone, suggesting Asthma but  recommended clinical corelation   . Polysubstance abuse     none since March 17,2009.  Marland Kitchen Lower extremity edema     Neg ABI's, normal 2D echo, normal albumin  . Chronic headache   . Chronic pain     normal work up including TSH, RPR, B12, HIV, plain films, 2 ESR's, ANA, CK, RF along with routine CBC, CMET and UA. Further work up includes CRP, ESR, SPEP/UPEP, hepatitis erology, A1C , repeat ANA  . Bipolar 1 disorder [facility])     therapist is Caryl Asp and is followed by [facility]  . Blackout     negative work up including ESR, ANA, opthalmology referral, carotid dopplers, 2D echo, MRI and EEG.  . DUB (dysfunctional uterine bleeding)     and pelvic pain, with negative endometrial bx in 07/09  and transvaginal U/S significant for mild fibroids in 0/09.  . 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 Asthma   . Bronchitis   . Sleep apnea     NO CPAP  . Menorrhagia   . Ovarian cyst    Past Surgical History  Procedure Laterality Date  . Cesarean section    . Hernia repair    . Left partial oophorectomy    . Cesarean section    . Oophorectomy      1/2 ovary removed  . Vagina surgery      mesh   Family History  Problem Relation Age of Onset  . Diabetes Father   . Hypertension Father   . Kidney disease Father   . Kidney disease Sister   . Colon cancer Mother 80  . Colon cancer Father 30  . Prostate cancer Father    Social History  Substance Use Topics  . Smoking status: Former Smoker -- 0.25 packs/day for 30 years    Types: Cigarettes  . Smokeless tobacco: Never Used  . Alcohol Use: No   OB History    Gravida Para Term Preterm AB TAB SAB Ectopic Multiple Living   _0 0 1 1 0 0       Review of Systems  HENT: Negative for congestion,  rhinorrhea and sore throat.   Eyes: Positive for visual disturbance.  Respiratory: Positive for shortness of breath. Negative for cough and wheezing.   Cardiovascular: Positive for chest pain. Negative for palpitations and leg swelling.  Gastrointestinal: Negative for nausea, vomiting, abdominal pain, diarrhea and constipation.  Endocrine: Negative for polydipsia and polyuria.  Musculoskeletal: Positive for myalgias and arthralgias. Negative for back pain and neck pain.  Skin: Negative for rash.  Neurological: Positive for weakness. Negative for dizziness and headaches.      Allergies  Topiramate and Tramadol  Home Medications   Prior to Admission medications   Medication Sig Start Date End Date Taking? Authorizing Provider  acetaminophen-codeine (TYLENOL #3) 300-30 MG tablet Take 1 tablet by mouth every 4 (four) hours as needed for moderate pain.   Yes Historical Provider, MD    albuterol (PROVENTIL) (2.5 MG/3ML) 0.083% nebulizer solution Take 3 mLs (2.5 mg total) by nebulization every 6 (six) hours as needed for wheezing or shortness of breath. 09/17/15  Yes Domenic Moras, PA-C  citalopram (CELEXA) 20 MG tablet Take 1 tablet (20 mg total) by mouth daily. 09/19/15 09/18/16 Yes Liberty Handy, MD  EPINEPHrine 0.3 mg/0.3 mL IJ SOAJ injection Inject 0.3 mLs (0.3 mg total) into the muscle once. 09/17/15  Yes Domenic Moras, PA-C  Fluticasone-Salmeterol (ADVAIR) 250-50 MCG/DOSE AEPB Inhale 1 puff into the lungs every 6 (six) hours as needed (shortness of breathe).   Yes Historical Provider, MD  HYDROcodone-acetaminophen (NORCO/VICODIN) 5-325 MG tablet Take 1 tablet by mouth every 6 (six) hours as needed for moderate pain.   Yes Historical Provider, MD  mometasone-formoterol (DULERA) 100-5 MCG/ACT AERO Inhale 2 puffs into the lungs 2 (two) times daily. 09/19/15  Yes Liberty Handy, MD  naproxen (NAPROSYN) 500 MG tablet Take 1 tablet (500 mg total) by mouth 2 (two) times daily. 07/21/15  Yes Hyman Bible, PA-C  oxyCODONE-acetaminophen (PERCOCET/ROXICET) 5-325 MG tablet Take by mouth every 4 (four) hours as needed for severe pain.   Yes Historical Provider, MD  omeprazole (PRILOSEC OTC) 20 MG tablet Take 1 tablet (20 mg total) by mouth daily. Patient not taking: Reported on 04/27/2015 03/26/15 03/25/16  Jessee Avers, MD   BP 136/84 mmHg  Pulse 67  Temp(Src) 97.9 F (36.6 C) (Oral)  Resp 18  SpO2 100% Physical Exam  Constitutional: She is oriented to person, place, and time. She appears well-developed and well-nourished.  Alert and in no acute distress  HENT:  Head: Normocephalic and atraumatic.  Neck:  No carotid bruits  Cardiovascular: Normal rate, regular rhythm, normal heart sounds and intact distal pulses.  Exam reveals no gallop and no friction rub.   No murmur heard. Bilateral lower extremities neurovascularly intact.   Pulmonary/Chest: Effort normal and breath sounds normal. No  respiratory distress. She has no rales.    Bilateral expiratory wheezing. TTP as depicted in image.   Abdominal: She exhibits no mass. There is no rebound and no guarding.  Abdomen soft, non-tender, non-distended Bowel sounds positive in all four quadrants  Musculoskeletal:  TTP of right hip Decreased ROM 2/2 pain 5/5 muscle strength in bilateral LE Negative straight leg raises bilaterally  Neurological: She is alert and oriented to person, place, and time.  Alert, oriented, thought content appropriate, able to give a coherent history. Speech is clear and goal oriented, able to follow commands.  Cranial Nerves:  II:  Peripheral visual fields grossly normal, pupils equal, round, reactive to light III, IV, VI: EOM intact bilaterally, ptosis not present V,VII: smile  symmetric, eyes kept closed tightly against resistance, facial light touch sensation equal VIII: hearing grossly normal IX, X: symmetric soft palate movement, uvula elevates symmetrically  XI: bilateral shoulder shrug symmetric and strong XII: midline tongue extension 5/5 muscle strength in upper and lower extremities bilaterally including strong and equal grip strength and dorsiflexion/plantar flexion Sensory to light touch normal in all four extremities.  Normal finger-to-nose and rapid alternating movements; Ambulating without difficulty.   Skin: Skin is warm and dry. No rash noted. She is not diaphoretic.  Psychiatric: She has a normal mood and affect. Her behavior is normal. Judgment and thought content normal.  Nursing note and vitals reviewed.   ED Course  Procedures (including critical care time) Labs Review Labs Reviewed  BASIC METABOLIC PANEL - Abnormal; Notable for the following:    Creatinine, Ser 1.01 (*)    All other components within normal limits  CBC  I-STAT TROPOININ, ED    Imaging Review Dg Chest 2 View  12/10/2015  CLINICAL DATA:  Left chest pain, asthma, bronchitis EXAM: CHEST  2 VIEW  COMPARISON:  07/21/2015 FINDINGS: Lungs are clear.  No pleural effusion or pneumothorax. The heart is normal in size. Mild degenerative changes of the visualized thoracolumbar spine. IMPRESSION: Normal chest radiographs. Electronically Signed   By: Julian Hy M.D.   On: 12/10/2015 12:24   Mr Jeri Cos XA Contrast  12/10/2015  CLINICAL DATA:  54 year old female with chest and extremity pain for 2 months with blurred vision. Initial encounter. EXAM: MRI HEAD WITHOUT AND WITH CONTRAST TECHNIQUE: Multiplanar, multiecho pulse sequences of the brain and surrounding structures were obtained without and with intravenous contrast. CONTRAST:  39m MULTIHANCE GADOBENATE DIMEGLUMINE 529 MG/ML IV SOLN. The patient became nauseated following IV contrast administration, but spontaneously recovered. COMPARISON:  Cervical spine MRI 01/31/2012. FINDINGS: Cerebral volume is within normal limits for age. No restricted diffusion to suggest acute infarction. No midline shift, mass effect, evidence of mass lesion, ventriculomegaly, extra-axial collection or acute intracranial hemorrhage. Cervicomedullary junction and pituitary are within normal limits. Negative visualized cervical spine. 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 GPearline Cablesand white matter signal is within normal limits for age throughout the brain. No cortical encephalomalacia or chronic cerebral blood products identified. No abnormal enhancement identified. No dural thickening. Visible internal auditory structures appear normal. Mastoids are clear. Mild to moderate scattered bilateral paranasal sinus mucosal thickening. Negative orbit and scalp soft tissues. IMPRESSION: 1. Normal for age MRI appearance of the brain. 2. Mild to moderate paranasal sinus inflammation. Electronically Signed   By: HGenevie AnnM.D.   On: 12/10/2015 19:51   Dg Hip Unilat With Pelvis 2-3 Views Right  12/10/2015   CLINICAL DATA:  Pain for 4 days.  No history of recent trauma EXAM: DG HIP (WITH OR WITHOUT PELVIS) 2-3V RIGHT COMPARISON:  None. FINDINGS: Frontal pelvis as well as frontal and lateral right hip images were obtained. There is no demonstrable fracture or dislocation. Hip joint spaces appear intact and normal bilaterally. No erosive change. Sacroiliac joints appear normal. IMPRESSION: No fracture or dislocation.  No apparent arthropathy. Electronically Signed   By: WLowella GripIII M.D.   On: 12/10/2015 16:09   I have personally reviewed and evaluated these images and lab results as part of my medical decision-making.   EKG Interpretation None      MDM   Final diagnoses:  Vision loss  Chest pain, unspecified chest pain type  Hip  pain, right   TUYET BADER presents with chest pain associated with transient bilateral vision loss and right hip pain. Low risk Heart score of 3  Labs: Troponin 0.00; CBC and BMP reassuring.  Imaging: CXR with no acute cardiopulm dz; Hip xray unremarkable; MRI unremarkable  Consults: Neurology who recommends MR w and without - if negative, patient to follow up as outpatient.   Therapeutics: Duoneb tx x 2; pain control while in ED  A&P:  Hip pain:   - PCP follow up  - 2 days pain control until patient can see PCP Chest pain / vision loss  - Cardiology follow up  - Neurology follow up  Patient discussed with Dr. Thomasene Lot who agrees with treatment plan.   [facility], PA-C 12/10/15 2027  Courteney Julio Alm, MD 12/10/15 2352

## 2015-12-17 ENCOUNTER — Encounter: Payer: Self-pay | Admitting: Internal Medicine

## 2015-12-17 ENCOUNTER — Ambulatory Visit (INDEPENDENT_AMBULATORY_CARE_PROVIDER_SITE_OTHER): Payer: Self-pay | Admitting: Internal Medicine

## 2015-12-17 VITALS — BP 102/66 | HR 78 | Temp 98.0°F | Ht 62.0 in | Wt 162.0 lb

## 2015-12-17 DIAGNOSIS — M25559 Pain in unspecified hip: Secondary | ICD-10-CM

## 2015-12-17 DIAGNOSIS — J45901 Unspecified asthma with (acute) exacerbation: Secondary | ICD-10-CM

## 2015-12-17 DIAGNOSIS — F32A Depression, unspecified: Secondary | ICD-10-CM

## 2015-12-17 DIAGNOSIS — F329 Major depressive disorder, single episode, unspecified: Secondary | ICD-10-CM

## 2015-12-17 MED ORDER — CITALOPRAM HYDROBROMIDE 40 MG PO TABS: 40.0000 mg | ORAL_TABLET | Freq: Every day | ORAL | Status: DC

## 2015-12-17 MED ORDER — ALBUTEROL SULFATE (2.5 MG/3ML) 0.083% IN NEBU: 2.5000 mg | INHALATION_SOLUTION | Freq: Four times a day (QID) | RESPIRATORY_TRACT | Status: DC | PRN

## 2015-12-17 MED ORDER — KETOROLAC TROMETHAMINE 30 MG/ML IM SOLN: 30.0000 mg | Freq: Once | INTRAMUSCULAR | Status: DC

## 2015-12-17 MED ORDER — KETOROLAC TROMETHAMINE 30 MG/ML IJ SOLN
30.0000 mg | Freq: Once | INTRAMUSCULAR | Status: AC
  Administered 2015-12-17: 30 mg via INTRAMUSCULAR

## 2015-12-17 MED ORDER — LIDOCAINE 5 % EX PTCH: 1.0000 | MEDICATED_PATCH | CUTANEOUS | Status: DC

## 2015-12-17 NOTE — Progress Notes (Signed)
Subjective:    Patient ID: Kathleen [name], female    DOB: August 07, 1961, 55 y.o.   MRN: [id number]  HPI Kathleen [name] is a 55 y.o. female with PMHx of GERD, Chronic pain who presents to the clinic for follow up of her chronic pain. Please see A&P for the status of the patient's chronic medical problems.   Past Medical History  Diagnosis Date  . GERD (gastroesophageal reflux disease)   . Asthma     2 sets of PFT's in 04/09 without sign variability. Last set with significant decrease in FEV1 with saline alone, suggesting Asthma but  recommended clinical corelation   . Polysubstance abuse     none since March 17,2009.  Marland Kitchen Lower extremity edema     Neg ABI's, normal 2D echo, normal albumin  . Chronic headache   . Chronic pain     normal work up including TSH, RPR, B12, HIV, plain films, 2 ESR's, ANA, CK, RF along with routine CBC, CMET and UA. Further work up includes CRP, ESR, SPEP/UPEP, hepatitis erology, A1C , repeat ANA  . Bipolar 1 disorder [facility])     therapist is Caryl Asp and is followed by [facility] [facility]  . Blackout     negative work up including ESR, ANA, opthalmology referral, carotid dopplers, 2D echo, MRI and EEG.  . DUB (dysfunctional uterine bleeding)     and pelvic pain, with negative endometrial bx in 07/09 and transvaginal U/S significant for mild fibroids in 0/09.  . 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 Asthma   . Bronchitis   . Sleep apnea     NO CPAP  . Menorrhagia   . Ovarian cyst     Outpatient Encounter Prescriptions as of 12/17/2015  Medication Sig  . acetaminophen-codeine (TYLENOL #3) 300-30 MG tablet Take 1 tablet by mouth every 4 (four) hours as needed for moderate pain.  Marland Kitchen albuterol (PROVENTIL) (2.5 MG/3ML) 0.083% nebulizer solution Take 3 mLs (2.5 mg total) by nebulization every 6 (six) hours as needed for wheezing or shortness of breath.  . citalopram (CELEXA) 40 MG tablet Take 1 tablet (40 mg total) by  mouth daily.  Marland Kitchen EPINEPHrine 0.3 mg/0.3 mL IJ SOAJ injection Inject 0.3 mLs (0.3 mg total) into the muscle once.  Marland Kitchen HYDROcodone-acetaminophen (NORCO/VICODIN) 5-325 MG tablet Take 1 tablet by mouth every 6 (six) hours as needed for moderate pain.  Marland Kitchen lidocaine (LIDODERM) 5 % Place 1 patch onto the skin daily. Remove & Discard patch within 12 hours or as directed by MD  . mometasone-formoterol (DULERA) 100-5 MCG/ACT AERO Inhale 2 puffs into the lungs 2 (two) times daily.  . naproxen (NAPROSYN) 500 MG tablet Take 1 tablet (500 mg total) by mouth 2 (two) times daily.  Marland Kitchen oxyCODONE-acetaminophen (PERCOCET/ROXICET) 5-325 MG tablet Take 1 tablet by mouth every 4 (four) hours as needed for severe pain.  . [DISCONTINUED] albuterol (PROVENTIL) (2.5 MG/3ML) 0.083% nebulizer solution Take 3 mLs (2.5 mg total) by nebulization every 6 (six) hours as needed for wheezing or shortness of breath.  . [DISCONTINUED] citalopram (CELEXA) 20 MG tablet Take 1 tablet (20 mg total) by mouth daily.  . [DISCONTINUED] Fluticasone-Salmeterol (ADVAIR) 250-50 MCG/DOSE AEPB Inhale 1 puff into the lungs every 6 (six) hours as needed (shortness of breathe).  . [DISCONTINUED] ketorolac (TORADOL) 30 MG/ML injection Inject 1 mL (30 mg total) into the muscle once.  . [DISCONTINUED] omeprazole (PRILOSEC OTC) 20  MG tablet Take 1 tablet (20 mg total) by mouth daily. (Patient not taking: Reported on 04/27/2015)   No facility-administered encounter medications on file as of 12/17/2015.    Family History  Problem Relation Age of Onset  . Diabetes Father   . Hypertension Father   . Kidney disease Father   . Kidney disease Sister   . Colon cancer Mother 64  . Colon cancer Father 60  . Prostate cancer Father     Social History   Social History  . Marital Status: Single    Spouse Name: N/A  . Number of Children: N/A  . Years of Education: N/A   Occupational History  . Not on file.   Social History Main Topics  . Smoking status:  Former Smoker -- 0.25 packs/day for 30 years    Types: Cigarettes  . Smokeless tobacco: Never Used  . Alcohol Use: No  . Drug Use: No     Comment: former  . Sexual Activity: Yes    Birth Control/ Protection: None     Comment: monogamous   Other Topics Concern  . Not on file   Social History Narrative   ** Merged History Encounter **       Lives between [place] and boyfriend's house , currently trying to abstain from illegal substances, continues to smoke and says nicotine patch made her sick.    Review of Systems General: Admits to fatigue. Respiratory: Denies SOB, cough, DOE.   Cardiovascular: Denies chest pain and palpitations.  Gastrointestinal: Denies diarrhea, constipation.  Musculoskeletal: Admits to low back pain and bilateral hip pain (chronic). Neurological: Denies dizziness, headaches, weakness, lightheadedness Psychiatric/Behavioral: Admits to depression.      Objective:   Physical Exam Filed Vitals:   12/17/15 0950  BP: 102/66  Pulse: 78  Temp: 98 F (36.7 C)  TempSrc: Oral  Height: '5\' 2"'  (1.575 m)  Weight: 162 lb (73.483 kg)  SpO2: 99%   General: Vital signs reviewed.  Patient is well-developed and well-nourished, in no acute distress and cooperative with exam.  Cardiovascular: RRR, S1 normal, S2 normal Pulmonary/Chest: Clear to auscultation bilaterally, no wheezes, rales, or rhonchi. Musculoskeletal: Tender on palpation of lumbar spine, through gluteal muscles, into bilateral hips and on lateral/anterior thighs. Extremely sensitive to even light touch. No joint deformities, erythema, or stiffness, ROM full. Extremities: No lower extremity edema bilaterally. Neurological: Strength is normal and symmetric bilaterally, sensory intact to light touch bilaterally in lower extremities.  Psychiatric: Agitated, aggressive, defensive.     Assessment & Plan:   Please see problem based assessment and plan.

## 2015-12-17 NOTE — Assessment & Plan Note (Signed)
Patient requests samples of Dulera and Albuterol inhalers as she has run out and cannot afford them. She denies any current symptoms of shortness of breath or wheezing. Lungs are clear.  Plan: -Provided Dulera sample medication BID use -Provided Albuterol sample medication prn use

## 2015-12-17 NOTE — Assessment & Plan Note (Signed)
Patient continues to struggle with depression. At last visit, patient's PHQ-9 score was a 27. She was started on Celexa 20 mg daily. PHQ-9 score is a 22 today. She denies any SI/HI. She has had previous thoughts in the past, but no active thoughts and no plan. Even at $4 at [facility], patient has financial restrictions getting the medicine. She has not followed up with [facility].  Plan: -Increase Celexa to 40 mg daily -Follow up with [facility] -Consider transition to Cymbalta in the future for both depression and pain if she could afford it

## 2015-12-17 NOTE — Patient Instructions (Addendum)
FOR YOUR PAIN: -USE THE LIDOCAINE PATCHES -WE HAVE GIVEN YOU A SHOT OF TORADOL HERE -WE WILL CHECK YOU URINE AS WELL -FOLLOW UP IN 2-4 WEEKS FOR FURTHER MANAGEMENT AFTER DISCUSSION WITH YOUR PRIMARY CARE DOCTOR  FOR YOUR ASTHMA: -USE THE [place] A DAY -USE THE ALBUTEROL INHALER AS NEEDED  FOR YOUR DEPRESSION: -TAKE CELEXA 40 MG DAILY -FOLLOW UP WITH [facility]  PATIENT WAS SEEN IN THE CLINIC ON 09/19/15 BY DR. Marijean Bravo, [place] ED ON 12/10/15 AND IN THE CLINIC ON 12/17/15 BY DR. Marvel Plan.

## 2015-12-17 NOTE — Assessment & Plan Note (Signed)
Patient presents with continued pain in lumbar spin and bilateral hips with radiation of pain to her knees bilaterally. Pain is severe, aching, constant. This has persisted over the last one year. Hip xrays have been normal in the past. It was recommended that patient get an MRI of her lumbar spine to further evaluate her pain, but patient did not proceed nor did she go to sports medicine. She states lidocaine patches and percocet have worked in the past, but she cannot afford the lidocaine patches and providers have been hesitant to start her on chronic opioids as she is not a candidate given history of substance abuse, refusal to agree to rational measures such as MRI and sports medicine, unlikely improvement in functional outcomes, risk of dependence and her atypical presentation of hyperalgesia. Although patient benefits from Toradol shots and requests one today, she states Naproxen does not work for her and will not try it again. She did not feel Flexeril helped in the past either. The above was discussed with the patient. Unfortunately, I do not know the best plan for her. We need to work up her pain further, but she is not agreeable to this today. She cannot afford lidocaine patches. She refuses to take NSAIDs or Flexeril. On follow up, I would continue to encourage her to either agree to further imaging, try NSAIDs again or Gabapentin, and consider referral to pain management or Sports Medicine.  Plan: -Toradol 30 mg IM once -UDS -Lidocaine patches

## 2015-12-17 NOTE — Addendum Note (Signed)
Addended by: Julieanne Manson A on: 12/17/2015 01:23 PM   Modules accepted: Orders

## 2015-12-17 NOTE — Progress Notes (Signed)
Internal Medicine Clinic Attending  Case discussed with Dr. Richardson at the time of the visit.  We reviewed the resident's history and exam and pertinent patient test results.  I agree with the assessment, diagnosis, and plan of care documented in the resident's note. 

## 2015-12-18 ENCOUNTER — Telehealth: Payer: Self-pay | Admitting: *Deleted

## 2015-12-18 ENCOUNTER — Ambulatory Visit: Payer: Self-pay | Admitting: Neurology

## 2015-12-18 NOTE — Telephone Encounter (Signed)
Called morning of her new patient appointment to let us know she was going to the ED for treatment.

## 2015-12-26 LAB — TOXASSURE SELECT,+ANTIDEPR,UR: PDF: 0

## 2016-01-13 ENCOUNTER — Ambulatory Visit (INDEPENDENT_AMBULATORY_CARE_PROVIDER_SITE_OTHER): Payer: Self-pay | Admitting: Internal Medicine

## 2016-01-13 ENCOUNTER — Encounter: Payer: Self-pay | Admitting: Internal Medicine

## 2016-01-13 VITALS — BP 114/69 | HR 68 | Temp 98.6°F | Wt 162.7 lb

## 2016-01-13 DIAGNOSIS — M545 Low back pain, unspecified: Secondary | ICD-10-CM

## 2016-01-13 DIAGNOSIS — M25559 Pain in unspecified hip: Secondary | ICD-10-CM

## 2016-01-13 DIAGNOSIS — G4700 Insomnia, unspecified: Secondary | ICD-10-CM

## 2016-01-13 DIAGNOSIS — M5416 Radiculopathy, lumbar region: Secondary | ICD-10-CM

## 2016-01-13 DIAGNOSIS — Z7951 Long term (current) use of inhaled steroids: Secondary | ICD-10-CM

## 2016-01-13 DIAGNOSIS — F329 Major depressive disorder, single episode, unspecified: Secondary | ICD-10-CM

## 2016-01-13 DIAGNOSIS — F32A Depression, unspecified: Secondary | ICD-10-CM

## 2016-01-13 DIAGNOSIS — G8929 Other chronic pain: Secondary | ICD-10-CM

## 2016-01-13 DIAGNOSIS — J45909 Unspecified asthma, uncomplicated: Secondary | ICD-10-CM

## 2016-01-13 DIAGNOSIS — J45901 Unspecified asthma with (acute) exacerbation: Secondary | ICD-10-CM

## 2016-01-13 MED ORDER — KETOROLAC TROMETHAMINE 30 MG/ML IJ SOLN
30.0000 mg | Freq: Once | INTRAMUSCULAR | Status: AC
  Administered 2016-01-13: 30 mg via INTRAMUSCULAR

## 2016-01-13 NOTE — Patient Instructions (Signed)
1. Please take your Dulera two puffs two times per day.  Continue to use albuterol as needed up to 4x per day.  I am sending you for lung tests to see if this is asthma or COPD.  This may change how we treat you going forward.     2. Please take all medications as prescribed.    3. If you have worsening of your symptoms or new symptoms arise, please call the clinic [phone]), or go to the ER immediately if symptoms are severe.   If breathing gets worse or you have new symptoms please come back to clinic.  Come back once you get [facility] Card so we can refer you to Sports Medicine for possible steroid injection.  Otherwise, come back and see me in June.

## 2016-01-13 NOTE — Progress Notes (Signed)
Subjective:    Patient ID: Kathleen [name], female    DOB: Jul 22, 1961, 55 y.o.   MRN: [id number]  HPI Comments: Kathleen [name] is a 55 year old woman with PMH as below here with c/o worsening asthma despite resuming Dulera two weeks ago.  She says she has been taking 1 puff QID.  She is also using albuterol inhaler and neb constantly.    Continues to have hip pain and wants a steroid shot.  She says toradol injection worked well for about three days last time and she would like another as well.  She says she uses oxycodone rx by ED and requesting additional pain med.      Past Medical History  Diagnosis Date  . GERD (gastroesophageal reflux disease)   . Asthma     2 sets of PFT's in 04/09 without sign variability. Last set with significant decrease in FEV1 with saline alone, suggesting Asthma but  recommended clinical corelation   . Polysubstance abuse     none since March 17,2009.  Marland Kitchen Lower extremity edema     Neg ABI's, normal 2D echo, normal albumin  . Chronic headache   . Chronic pain     normal work up including TSH, RPR, B12, HIV, plain films, 2 ESR's, ANA, CK, RF along with routine CBC, CMET and UA. Further work up includes CRP, ESR, SPEP/UPEP, hepatitis erology, A1C , repeat ANA  . Bipolar 1 disorder [facility])     therapist is Caryl Asp and is followed by [facility]  . Blackout     negative work up including ESR, ANA, opthalmology referral, carotid dopplers, 2D echo, MRI and EEG.  . DUB (dysfunctional uterine bleeding)     and pelvic pain, with negative endometrial bx in 07/09 and transvaginal U/S significant for mild fibroids in 0/09.  . 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 Asthma   . Bronchitis   . Sleep apnea     NO CPAP  . Menorrhagia   . Ovarian cyst    Current Outpatient Prescriptions on File Prior to Visit  Medication Sig Dispense Refill  . acetaminophen-codeine (TYLENOL #3) 300-30 MG tablet Take 1 tablet by mouth  every 4 (four) hours as needed for moderate pain.    Marland Kitchen albuterol (PROVENTIL) (2.5 MG/3ML) 0.083% nebulizer solution Take 3 mLs (2.5 mg total) by nebulization every 6 (six) hours as needed for wheezing or shortness of breath. 75 mL 2  . citalopram (CELEXA) 40 MG tablet Take 1 tablet (40 mg total) by mouth daily. 30 tablet 2  . EPINEPHrine 0.3 mg/0.3 mL IJ SOAJ injection Inject 0.3 mLs (0.3 mg total) into the muscle once. 1 Device 1  . HYDROcodone-acetaminophen (NORCO/VICODIN) 5-325 MG tablet Take 1 tablet by mouth every 6 (six) hours as needed for moderate pain.    Marland Kitchen lidocaine (LIDODERM) 5 % Place 1 patch onto the skin daily. Remove & Discard patch within 12 hours or as directed by MD 30 patch 0  . mometasone-formoterol (DULERA) 100-5 MCG/ACT AERO Inhale 2 puffs into the lungs 2 (two) times daily. 1 Inhaler 0  . naproxen (NAPROSYN) 500 MG tablet Take 1 tablet (500 mg total) by mouth 2 (two) times daily. 30 tablet 0  . oxyCODONE-acetaminophen (PERCOCET/ROXICET) 5-325 MG tablet Take 1 tablet by mouth every 4 (four) hours as needed for severe pain. 8 tablet 0   No current facility-administered medications on file prior to visit.  Review of Systems  Constitutional: Negative for fever and chills.  Respiratory: Positive for cough, shortness of breath and wheezing.   Gastrointestinal:       No bowel or bladder incontinence.  Musculoskeletal: Positive for back pain.  Neurological:       No saddle anesthesia.  Psychiatric/Behavioral: Positive for sleep disturbance. Negative for suicidal ideas.       Filed Vitals:   01/13/16 1042  BP: 114/69  Pulse: 68  Temp: 98.6 F (37 C)  TempSrc: Oral  Weight: 162 lb 11.2 oz (73.8 kg)  SpO2: 100%     Objective:   Physical Exam  Constitutional: She is oriented to person, place, and time. She appears well-developed. No distress.  HENT:  Head: Normocephalic and atraumatic.  Mouth/Throat: Oropharynx is clear and moist. No oropharyngeal exudate.    Eyes: Conjunctivae and EOM are normal. Pupils are equal, round, and reactive to light. Right eye exhibits no discharge. Left eye exhibits no discharge. No scleral icterus.  Neck: Neck supple.  Cardiovascular: Normal rate, regular rhythm and normal heart sounds.  Exam reveals no gallop and no friction rub.   No murmur heard. Pulmonary/Chest: Effort normal. No respiratory distress. She has wheezes. She has no rales.  Abdominal: Soft. Bowel sounds are normal. She exhibits no distension. There is no tenderness. There is no rebound and no guarding.  Musculoskeletal: Normal range of motion. She exhibits no edema or tenderness.  Neurological: She is alert and oriented to person, place, and time. She has normal reflexes. No cranial nerve deficit.  Gait is normal.  Upper and lower extremity strength and sensation grossly intact and equal.  Skin: Skin is warm. She is not diaphoretic.  Psychiatric: She has a normal mood and affect. Her behavior is normal. Judgment and thought content normal.  Vitals reviewed.         Assessment & Plan:  Please see problem based charting for A&P.

## 2016-01-18 NOTE — Assessment & Plan Note (Addendum)
Assessment:  She reports dyspnea, wheeze and increased inhaler/neb use.  Previous PFTs in 2009 suggested asthma but not completely conclusive as there was no methylcholine challenge.  She does have smoking hx and continues to use THC intermittently.  She reports using 1 puff Dulera QID.  She has some mild wheezing on exam but moving air, no respiratory distress or accessory muscle use, she is speaking to me in full sentences, SpO2 100% on RA and other vitals stable.   Plan:  I advised she use Dulera as prescribed 2 puffs BID for better effect.  Albuterol prn.  Advised to stop THC use.  Will have her return in 1-2 weeks.  If no better, plan to increase Dulera to high dose, 4 puff BID (800/20).  Will send for repeat PFTs to clarify dx (asthma vs COPD vs ACOS).  Return after PFTs for further management.  Return sooner or to ED with severe symptoms.

## 2016-01-18 NOTE — Assessment & Plan Note (Addendum)
Assessment:  Chronic low back pain w/ radicular symptoms. She says the toradol shot she received last visit worked for about 3 days and she is requesting another one.  She would also like a steroid injection.  No red flag symptoms.   Plan:  IM Toradol 30mg  given in clinic.  Her [employer] Card has lapsed but she plans to come in and see Marlana Latus to get it renewed.  Once she has this, she agrees to referral to Sports Medicine for evaluation for steroid injection.  Continue OTC pain relievers, heat, rest etc.  UDS obtained by my colleague at last visit was unexpectedly + for metabolites of cocaine, THC and oxycodone - will not consider for chronic opioid treatment given drug use and depression (will refer to CSW to link her back in with mental health services).

## 2016-01-18 NOTE — Assessment & Plan Note (Signed)
Assessment:  Celexa just increased to 40mg  daily three weeks ago.  She has insomnia.  No SI.  Much of our visit focused on respiratory symptoms and chronic back pain.  She has not seen mental health provider in awhile and she is agreeable to referral for services. Plan:  Continue Celexa 40mg  daily.  She understands that she must not stop this med abruptly and it should be tapered.  Refer to CSW to link back into mental health services.

## 2016-01-19 ENCOUNTER — Telehealth: Payer: Self-pay | Admitting: Licensed Clinical Social Worker

## 2016-01-19 NOTE — Telephone Encounter (Signed)
Kathleen [name] was referred to CSW for behavioral health services.  CSW placed call to Kathleen [name].  Pt states she is working on obtaining her [employer] and has already spoken with her DSS caseworker.

## 2016-01-20 NOTE — Progress Notes (Signed)
Internal Medicine Clinic Attending  Case discussed with Dr. Wilson soon after the resident saw the patient.  We reviewed the resident's history and exam and pertinent patient test results.  I agree with the assessment, diagnosis, and plan of care documented in the resident's note.  

## 2016-01-22 NOTE — Telephone Encounter (Signed)
Pt has already spoken with DSS Caseworker regarding documentation of terminating her family planning medicaid.  CSW provided Kathleen [name] with local behavioral health resources which work on a sliding fee scale, [facility] and [facility] [place].  Kathleen [name] has utilized [place] in the past and plans to return to [place] for services.  CSW will mail Kathleen [name] additional behavioral health resources and ROI.

## 2016-01-23 ENCOUNTER — Ambulatory Visit (INDEPENDENT_AMBULATORY_CARE_PROVIDER_SITE_OTHER): Payer: Self-pay | Admitting: Internal Medicine

## 2016-01-23 ENCOUNTER — Ambulatory Visit: Payer: Medicaid Other

## 2016-01-23 ENCOUNTER — Encounter: Payer: Self-pay | Admitting: Internal Medicine

## 2016-01-23 ENCOUNTER — Ambulatory Visit (HOSPITAL_COMMUNITY)
Admission: RE | Admit: 2016-01-23 | Discharge: 2016-01-23 | Disposition: A | Discharge status: Home / Self Care | Payer: Medicaid Other | Source: Ambulatory Visit | Attending: Internal Medicine | Admitting: Internal Medicine

## 2016-01-23 ENCOUNTER — Telehealth: Payer: Self-pay | Admitting: *Deleted

## 2016-01-23 VITALS — BP 110/78 | HR 64 | Temp 98.4°F | Ht 62.0 in | Wt 162.7 lb

## 2016-01-23 DIAGNOSIS — R0789 Other chest pain: Secondary | ICD-10-CM

## 2016-01-23 DIAGNOSIS — R9431 Abnormal electrocardiogram [ECG] [EKG]: Secondary | ICD-10-CM | POA: Insufficient documentation

## 2016-01-23 NOTE — Progress Notes (Signed)
Patient ID: Kathleen [name], female   DOB: 1961-08-08, 55 y.o.   MRN: [id number]   Subjective:   Patient ID: Kathleen [name] female   DOB: 12-Feb-1961 55 y.o.   MRN: [id number]  HPI: [name] is a 55 y.o. female with PMH as below, here for eval chest pain.  Please see Problem-Based charting for the status of the patient's chronic medical issues.  She complains of sudden onset left sided parasternal stabbing chest pain that started yesterday morning after waking from sleep.  She describes the pain as stabbing, lasting 5 seconds, occurring every 7-10 minutes, without radiation. Pain is reproduced with cough, the act of sitting up, and with palpation. She endorses some sweating recently, which she attributes to her menopause.  It is not correlated with the pain.  She endorses hyperventilation, which she attributes to her asthma.  Her asthma has not been well controlled. She was recently increased to [facility] 2 puffs BID.  She continues to use albuterol inhaler 2-3 times daily and albuterol nebulizer BID. She has a chronic cough productive of yellow sputum, unchanged from recent.   She denies other recent physical exertion that would explain a strained muscle. She has a h/o GERD, but states this is different from her reflux.  She states she quit smoking last year.  She states her last marijuana was last November. She denies drug use.  She denies N/V, orthopnea, weight gain, leg swelling, or dysphagia.   Past Medical History  Diagnosis Date  . GERD (gastroesophageal reflux disease)   . Asthma     2 sets of PFT's in 04/09 without sign variability. Last set with significant decrease in FEV1 with saline alone, suggesting Asthma but  recommended clinical corelation   . Polysubstance abuse     none since March 17,2009.  Marland Kitchen Lower extremity edema     Neg ABI's, normal 2D echo, normal albumin  . Chronic headache   . Chronic pain     normal work up including TSH, RPR, B12, HIV, plain films, 2 ESR's, ANA,  CK, RF along with routine CBC, CMET and UA. Further work up includes CRP, ESR, SPEP/UPEP, hepatitis erology, A1C , repeat ANA  . Bipolar 1 disorder [facility])     therapist is Caryl Asp and is followed by [facility]  . Blackout     negative work up including ESR, ANA, opthalmology referral, carotid dopplers, 2D echo, MRI and EEG.  . DUB (dysfunctional uterine bleeding)     and pelvic pain, with negative endometrial bx in 07/09 and transvaginal U/S significant for mild fibroids in 0/09.  . 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 Asthma   . Bronchitis   . Sleep apnea     NO CPAP  . Menorrhagia   . Ovarian cyst    Current Outpatient Prescriptions  Medication Sig Dispense Refill  . acetaminophen-codeine (TYLENOL #3) 300-30 MG tablet Take 1 tablet by mouth every 4 (four) hours as needed for moderate pain.    Marland Kitchen albuterol (PROVENTIL) (2.5 MG/3ML) 0.083% nebulizer solution Take 3 mLs (2.5 mg total) by nebulization every 6 (six) hours as needed for wheezing or shortness of breath. 75 mL 2  . citalopram (CELEXA) 40 MG tablet Take 1 tablet (40 mg total) by mouth daily. 30 tablet 2  . EPINEPHrine 0.3 mg/0.3 mL IJ SOAJ injection Inject 0.3 mLs (0.3 mg total) into the muscle once. 1 Device 1  .  HYDROcodone-acetaminophen (NORCO/VICODIN) 5-325 MG tablet Take 1 tablet by mouth every 6 (six) hours as needed for moderate pain.    Marland Kitchen lidocaine (LIDODERM) 5 % Place 1 patch onto the skin daily. Remove & Discard patch within 12 hours or as directed by MD 30 patch 0  . mometasone-formoterol (DULERA) 100-5 MCG/ACT AERO Inhale 2 puffs into the lungs 2 (two) times daily. 1 Inhaler 0  . naproxen (NAPROSYN) 500 MG tablet Take 1 tablet (500 mg total) by mouth 2 (two) times daily. 30 tablet 0  . oxyCODONE-acetaminophen (PERCOCET/ROXICET) 5-325 MG tablet Take 1 tablet by mouth every 4 (four) hours as needed for severe pain. 8 tablet 0   No current facility-administered  medications for this visit.   Family History  Problem Relation Age of Onset  . Diabetes Father   . Hypertension Father   . Kidney disease Father   . Kidney disease Sister   . Colon cancer Mother 58  . Colon cancer Father 6  . Prostate cancer Father    Social History   Social History  . Marital Status: Single    Spouse Name: N/A  . Number of Children: N/A  . Years of Education: N/A   Social History Main Topics  . Smoking status: Former Smoker -- 0.25 packs/day for 30 years    Types: Cigarettes  . Smokeless tobacco: Never Used  . Alcohol Use: No  . Drug Use: No     Comment: former  . Sexual Activity: Not Asked     Comment: monogamous   Other Topics Concern  . None   Social History Narrative   ** Merged History Encounter **       Lives between [employer] and boyfriend's house , currently trying to abstain from illegal substances, continues to smoke and says nicotine patch made her sick.   Review of Systems: Pertinent items are noted in HPI. Objective:  Physical Exam: Filed Vitals:   01/23/16 1330  BP: 110/78  Pulse: 64  Temp: 98.4 F (36.9 C)  TempSrc: Oral  Height: _0  (1.575 m)  Weight: 162 lb 11.2 oz (73.8 kg)  SpO2: 100%   Physical Exam  Constitutional: She is oriented to person, place, and time and well-developed, well-nourished, and in no distress. No distress.  HENT:  Head: Normocephalic and atraumatic.  Voice hoarse.  Eyes: EOM are normal. No scleral icterus.  Neck: No tracheal deviation present.  Cardiovascular: Normal rate, regular rhythm and normal heart sounds.   Pulmonary/Chest: No stridor.  Poor inspiratory effort 2/2 cough.  Diffuse wheezes in anterior and posterior fields. No crackles. No increased WOB, tachypnea or accessory muscles.  Chest pain reproduced with palpation of left parasternal chest wall.  Abdominal: Soft. She exhibits no distension. There is no tenderness. There is no rebound and no guarding.  Musculoskeletal:  Trace  edema to ankle.  Neurological: She is alert and oriented to person, place, and time.  Skin: Skin is warm and dry. She is not diaphoretic.     Assessment & Plan:   Patient and case were discussed with Dr. Lynnae January.  Please refer to Problem Based charting for further documentation.

## 2016-01-23 NOTE — Patient Instructions (Signed)
1. Take Dulera 4 puffs twice daily. 2. No tobacco or marijuana. 3. Continue your nebulizers and rescue inhaler as needed. 4. Return to clinic next week.  Chest Wall Pain Chest wall pain is pain in or around the bones and muscles of your chest. Sometimes, an injury causes this pain. Sometimes, the cause may not be known. This pain may take several weeks or longer to get better. HOME CARE INSTRUCTIONS  Pay attention to any changes in your symptoms. Take these actions to help with your pain:   Rest as told by your health care provider.   Avoid activities that cause pain. These include any activities that use your chest muscles or your abdominal and side muscles to lift heavy items.   If directed, apply ice to the painful area:  Put ice in a plastic bag.  Place a towel between your skin and the bag.  Leave the ice on for 20 minutes, 2-3 times per day.  Take over-the-counter and prescription medicines only as told by your health care provider.  Do not use tobacco products, including cigarettes, chewing tobacco, and e-cigarettes. If you need help quitting, ask your health care provider.  Keep all follow-up visits as told by your health care provider. This is important. SEEK MEDICAL CARE IF:  You have a fever.  Your chest pain becomes worse.  You have new symptoms. SEEK IMMEDIATE MEDICAL CARE IF:  You have nausea or vomiting.  You feel sweaty or light-headed.  You have a cough with phlegm (sputum) or you cough up blood.  You develop shortness of breath.   This information is not intended to replace advice given to you by your health care provider. Make sure you discuss any questions you have with your health care provider.   Document Released: 11/29/2005 Document Revised: 08/20/2015 Document Reviewed: 02/24/2015 Elsevier Interactive Patient Education [employer].

## 2016-01-23 NOTE — Assessment & Plan Note (Signed)
A/P: Noncardiac chest pain, most likely MSK 2/2 uncontrolled asthma and cough.  Pain is intermittent, sharp, lasting only seconds, not brought on by exertion.  Recent presentation with ED with negative troponin.  Symptoms have occurred >24 hours without ECG changes (unchanged from 12/10/15).  Very little concern for ACS or CHF.  Only abnormal exam is diffuse wheezing and hoarse voice.  Will increase patient's asthma control and re-evaluate patient next week. - Dulera 4 puffs BID - RTC 5 days

## 2016-01-23 NOTE — Telephone Encounter (Signed)
Walk in- pt states she has chest pain since 2/9, she is always short of breath, states the chest is like a spasm, she states her asthma is worse today, she states she possibly has had fevers. Some nausea, she is also having hip pain and this is ongoing. Breathing treatment this am, helped some. Noticeable hoarseness. Able to carry on conversation moderately well, some change in breathing noted.  appt 1315 dr Lovena Le

## 2016-01-26 NOTE — Telephone Encounter (Signed)
[place], dr taylor states in his visit note pt needs f/u appt in 5 days, can you check on this?  Thanks, h.

## 2016-01-26 NOTE — Progress Notes (Signed)
Internal Medicine Clinic Attending  Case discussed with Dr. Taylor at the time of the visit.  We reviewed the resident's history and exam and pertinent patient test results.  I agree with the assessment, diagnosis, and plan of care documented in the resident's note. 

## 2016-01-29 ENCOUNTER — Ambulatory Visit: Payer: Self-pay | Admitting: Internal Medicine

## 2016-02-05 ENCOUNTER — Encounter: Payer: Self-pay | Admitting: Internal Medicine

## 2016-02-05 ENCOUNTER — Encounter: Payer: Self-pay | Admitting: Licensed Clinical Social Worker

## 2016-02-05 ENCOUNTER — Ambulatory Visit (INDEPENDENT_AMBULATORY_CARE_PROVIDER_SITE_OTHER): Payer: Self-pay | Admitting: Internal Medicine

## 2016-02-05 VITALS — BP 113/72 | HR 76 | Temp 98.3°F | Ht 62.0 in | Wt 160.1 lb

## 2016-02-05 DIAGNOSIS — R0789 Other chest pain: Secondary | ICD-10-CM

## 2016-02-05 DIAGNOSIS — F329 Major depressive disorder, single episode, unspecified: Secondary | ICD-10-CM

## 2016-02-05 DIAGNOSIS — Z7951 Long term (current) use of inhaled steroids: Secondary | ICD-10-CM

## 2016-02-05 DIAGNOSIS — K219 Gastro-esophageal reflux disease without esophagitis: Secondary | ICD-10-CM

## 2016-02-05 DIAGNOSIS — J45909 Unspecified asthma, uncomplicated: Secondary | ICD-10-CM

## 2016-02-05 DIAGNOSIS — F32A Depression, unspecified: Secondary | ICD-10-CM

## 2016-02-05 DIAGNOSIS — M25552 Pain in left hip: Secondary | ICD-10-CM

## 2016-02-05 DIAGNOSIS — Z79899 Other long term (current) drug therapy: Secondary | ICD-10-CM

## 2016-02-05 DIAGNOSIS — J45901 Unspecified asthma with (acute) exacerbation: Secondary | ICD-10-CM

## 2016-02-05 MED ORDER — MELOXICAM 7.5 MG PO TABS: 7.5000 mg | ORAL_TABLET | Freq: Every day | ORAL | Status: DC

## 2016-02-05 MED ORDER — CITALOPRAM HYDROBROMIDE 40 MG PO TABS: 40.0000 mg | ORAL_TABLET | Freq: Every day | ORAL | Status: DC

## 2016-02-05 MED ORDER — OMEPRAZOLE 40 MG PO CPDR: 40.0000 mg | DELAYED_RELEASE_CAPSULE | Freq: Every day | ORAL | Status: DC

## 2016-02-05 NOTE — Patient Instructions (Signed)
1. Please make a follow up appointment for 4 weeks.   2. Please take all medications as previously prescribed with the following changes:  Start taking Omeprazole 40 mg daily  Take Mobic 7.5 mg daily AS NEEDED for left hip pain.   3. If you have worsening of your symptoms or new symptoms arise, please call the clinic [phone]), or go to the ER immediately if symptoms are severe.  You have done a great job in taking all your medications. Please continue to do this.

## 2016-02-05 NOTE — Progress Notes (Signed)
Subjective:   Patient ID: Kathleen [name] female   DOB: [birth year] 55 y.o.   MRN: [id number]  HPI: [name] is a 55 y.o. female w/ PMHx of GERD, Asthma, chronic pain, Bipolar 1 Disorder, and OSA, presents to the clinic today for a follow-up visit regarding her chest pain. Patient was seen about 1 week ago for sharp substernal chest pain. EKG was normal at that time. Conisdered to be associated with her Asthma. She says she still gets the pain a bit, sometimes feels it is epigastric and radiates into her chest. Worse when lying flat. Sometimes burning pain.   Patient also describes a left hip pain as well. Has been present for a very long time, receives Toradol injections infrequently during clinic visits for this.   Past Medical History  Diagnosis Date  . GERD (gastroesophageal reflux disease)   . Asthma     2 sets of PFT's in 04/09 without sign variability. Last set with significant decrease in FEV1 with saline alone, suggesting Asthma but  recommended clinical corelation   . Polysubstance abuse     none since March 17,2009.  Marland Kitchen Lower extremity edema     Neg ABI's, normal 2D echo, normal albumin  . Chronic headache   . Chronic pain     normal work up including TSH, RPR, B12, HIV, plain films, 2 ESR's, ANA, CK, RF along with routine CBC, CMET and UA. Further work up includes CRP, ESR, SPEP/UPEP, hepatitis erology, A1C , repeat ANA  . Bipolar 1 disorder [facility])     therapist is Caryl Asp and is followed by [facility]  . Blackout     negative work up including ESR, ANA, opthalmology referral, carotid dopplers, 2D echo, MRI and EEG.  . DUB (dysfunctional uterine bleeding)     and pelvic pain, with negative endometrial bx in 07/09 and transvaginal U/S significant for mild fibroids in 0/09.  . 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 Asthma   . Bronchitis   . Sleep apnea     NO CPAP  . Menorrhagia   . Ovarian cyst    Current  Outpatient Prescriptions  Medication Sig Dispense Refill  . acetaminophen-codeine (TYLENOL #3) 300-30 MG tablet Take 1 tablet by mouth every 4 (four) hours as needed for moderate pain.    Marland Kitchen albuterol (PROVENTIL) (2.5 MG/3ML) 0.083% nebulizer solution Take 3 mLs (2.5 mg total) by nebulization every 6 (six) hours as needed for wheezing or shortness of breath. 75 mL 2  . citalopram (CELEXA) 40 MG tablet Take 1 tablet (40 mg total) by mouth daily. 30 tablet 2  . EPINEPHrine 0.3 mg/0.3 mL IJ SOAJ injection Inject 0.3 mLs (0.3 mg total) into the muscle once. 1 Device 1  . HYDROcodone-acetaminophen (NORCO/VICODIN) 5-325 MG tablet Take 1 tablet by mouth every 6 (six) hours as needed for moderate pain.    Marland Kitchen lidocaine (LIDODERM) 5 % Place 1 patch onto the skin daily. Remove & Discard patch within 12 hours or as directed by MD 30 patch 0  . mometasone-formoterol (DULERA) 100-5 MCG/ACT AERO Inhale 2 puffs into the lungs 2 (two) times daily. 1 Inhaler 0  . naproxen (NAPROSYN) 500 MG tablet Take 1 tablet (500 mg total) by mouth 2 (two) times daily. 30 tablet 0  . oxyCODONE-acetaminophen (PERCOCET/ROXICET) 5-325 MG tablet Take 1 tablet by mouth every 4 (four) hours as needed for severe pain. 8 tablet  0   No current facility-administered medications for this visit.    Review of Systems: General: Denies fever, chills, diaphoresis, appetite change and fatigue.  Respiratory: Denies SOB, DOE, cough, and wheezing.   Cardiovascular: Positive for intermittent chest pain. Denies palpitations.  Gastrointestinal: Denies nausea, vomiting, abdominal pain, and diarrhea.  Genitourinary: Denies dysuria, increased frequency, and flank pain. Endocrine: Denies hot or cold intolerance, polyuria, and polydipsia. Musculoskeletal: Positive for left hip pain. Denies back pain, joint swelling, and gait problem.  Skin: Denies pallor, rash and wounds.  Neurological: Denies dizziness, seizures, syncope, weakness, lightheadedness,  numbness and headaches.  Psychiatric/Behavioral: Denies mood changes, and sleep disturbances.  Objective:   Physical Exam: Filed Vitals:   02/05/16 1448  BP: 113/72  Pulse: 76  Temp: 98.3 F (36.8 C)  TempSrc: Oral  Height: _0  (1.575 m)  Weight: 160 lb 1.6 oz (72.621 kg)  SpO2: 100%    General: Alert, cooperative, NAD. HEENT: PERRL, EOMI. Moist mucus membranes Neck: Full range of motion without pain, supple, no lymphadenopathy or carotid bruits Lungs: Clear to ascultation bilaterally, normal work of respiration, no rales, rhonchi. Very faints scattered wheezes.  Heart: RRR, no murmurs, gallops, or rubs Abdomen: Soft, non-tender, non-distended, BS + Extremities: No cyanosis, clubbing, or edema. Tenderness over greater trochanteric bursa and sacroiliac joint. No limitations to ROM.  Neurologic: Alert & oriented x3, cranial nerves II-XII intact, strength grossly intact, sensation intact to light touch   Assessment & Plan:   Please see problem based assessment and plan.

## 2016-02-06 NOTE — Progress Notes (Signed)
Patient was asked if she had gone to [place].  Patient that she had not since she does not have  Her [employer] yet.  Plans to complete application next week.  Patient was informed by me and [facility] that she can just walk in at [facility] and be seen initially without an appointment and once established there will be given an appointment.  Fraser Din was also informed that [facility] also provides counseling as well.  Given information to call by [facility].  Patient stated that she plans to go to follow up with 1 of the places mentioned.by next week.  Patient said she would go sooner if needed.  But will go.  Sander Nephew, RN 02/05/2016 2:30 PM

## 2016-02-06 NOTE — Progress Notes (Signed)
Medicine attending: Medical history, presenting problems, physical findings, and medications, reviewed with resident physician Dr Eden Jones on the day of the patient visit and I concur with his evaluation and management plan. 

## 2016-02-06 NOTE — Assessment & Plan Note (Signed)
Think this may be contributing to her chest pain and asthma. Has taken omeprazole in the past, feels this helps.  -Restart PPI; omeprazole 40 mg daily.

## 2016-02-06 NOTE — Assessment & Plan Note (Signed)
Seems very atypical, do not suspect cardiac etiology. Recent EKG normal. Thought to be associated with asthma. Feels better today. Also describes some symptoms of heartburn as well, epigastric discomfort, radiating into chest and throat sometimes. Wonder if this is also a large component of her chest pain. Is also probably not helping her asthma symptoms.  -Start PPI

## 2016-02-06 NOTE — Assessment & Plan Note (Signed)
Breathing sounds good today, very faint scattered wheezes.  -Continue Dulera, albuterol prn

## 2016-02-06 NOTE — Assessment & Plan Note (Signed)
Patient with some left hip pain, gets stiff, tender over sacroiliac joint and trochanteric bursa. No limitations in ROM.  -Trial of Mobic 7.5 mg daily prn for 1-2 weeks.

## 2016-02-06 NOTE — Assessment & Plan Note (Signed)
Refilled Celexa

## 2016-02-11 NOTE — Progress Notes (Signed)
CSW met with Kathleen [name] during her scheduled [facility] appointment.  CSW thanked pt for returning [facility] and inquired if pt established care with [facility].  Kathleen [name] has not been to [facility] and prefers to wait until she completed her [employer] Application/[facility] card to have access to other providers.  Pt notified current psychiatry providers are scheduling at least 8 weeks out and to utilize [facility] for walk-in services.  Kathleen [name] provided with information on both agencies.

## 2016-02-17 ENCOUNTER — Ambulatory Visit: Payer: Self-pay

## 2016-02-24 ENCOUNTER — Ambulatory Visit: Payer: Self-pay

## 2016-02-24 NOTE — Addendum Note (Signed)
Addended by: Hulan Fray on: 02/24/2016 07:22 PM   Modules accepted: Orders

## 2016-03-04 ENCOUNTER — Telehealth: Payer: Self-pay | Admitting: Internal Medicine

## 2016-03-04 NOTE — Telephone Encounter (Signed)
APPT. REMINDER CALL, NO ANSWER, NO VOICE MAIL °

## 2016-03-05 ENCOUNTER — Encounter: Payer: Self-pay | Admitting: Internal Medicine

## 2016-03-05 ENCOUNTER — Ambulatory Visit: Payer: Self-pay | Admitting: Internal Medicine

## 2016-03-16 ENCOUNTER — Ambulatory Visit: Payer: Self-pay | Admitting: Internal Medicine

## 2016-03-24 ENCOUNTER — Ambulatory Visit (INDEPENDENT_AMBULATORY_CARE_PROVIDER_SITE_OTHER): Payer: Self-pay | Admitting: Internal Medicine

## 2016-03-24 ENCOUNTER — Encounter: Payer: Self-pay | Admitting: Internal Medicine

## 2016-03-24 VITALS — BP 103/71 | HR 75 | Temp 98.0°F | Ht 62.0 in | Wt 160.7 lb

## 2016-03-24 DIAGNOSIS — Z79899 Other long term (current) drug therapy: Secondary | ICD-10-CM

## 2016-03-24 DIAGNOSIS — K219 Gastro-esophageal reflux disease without esophagitis: Secondary | ICD-10-CM

## 2016-03-24 DIAGNOSIS — J45901 Unspecified asthma with (acute) exacerbation: Secondary | ICD-10-CM

## 2016-03-24 DIAGNOSIS — G8929 Other chronic pain: Secondary | ICD-10-CM

## 2016-03-24 DIAGNOSIS — M545 Low back pain, unspecified: Secondary | ICD-10-CM

## 2016-03-24 DIAGNOSIS — Z7951 Long term (current) use of inhaled steroids: Secondary | ICD-10-CM

## 2016-03-24 DIAGNOSIS — K0889 Other specified disorders of teeth and supporting structures: Secondary | ICD-10-CM

## 2016-03-24 DIAGNOSIS — J45909 Unspecified asthma, uncomplicated: Secondary | ICD-10-CM

## 2016-03-24 DIAGNOSIS — H538 Other visual disturbances: Secondary | ICD-10-CM

## 2016-03-24 MED ORDER — ALBUTEROL SULFATE HFA 108 (90 BASE) MCG/ACT IN AERS: 2.0000 | INHALATION_SPRAY | RESPIRATORY_TRACT | Status: DC | PRN

## 2016-03-24 MED ORDER — OMEPRAZOLE 20 MG PO CPDR: 20.0000 mg | DELAYED_RELEASE_CAPSULE | Freq: Every day | ORAL | Status: DC

## 2016-03-24 MED ORDER — MELOXICAM 7.5 MG PO TABS: 7.5000 mg | ORAL_TABLET | Freq: Every day | ORAL | Status: DC

## 2016-03-24 MED ORDER — FLUTICASONE-SALMETEROL 250-50 MCG/DOSE IN AEPB: 1.0000 | INHALATION_SPRAY | Freq: Two times a day (BID) | RESPIRATORY_TRACT | Status: DC

## 2016-03-24 NOTE — Assessment & Plan Note (Addendum)
Pt reports chronic low back pain that subsides with mobic.  SCr mildly elevated last BMP with normal GFR.  She is also here for a referral to PT. -she is requesting a refill of mobic, refill #30 today without additional refills  -will need to check BMP in 1 month  -will need to monitor SCr closely -referral to PT

## 2016-03-24 NOTE — Addendum Note (Signed)
Addended by: Jones Bales on: 03/24/2016 11:38 AM   Modules accepted: Orders

## 2016-03-24 NOTE — Progress Notes (Signed)
Patient ID: [name], female   DOB: May 17, 1961, 55 y.o.   MRN: [id number]     Subjective:   Patient ID: Kathleen [name] female    DOB: [birth year] 55 y.o.    MRN: [id number] Health Maintenance Due: Health Maintenance Due  Topic Date Due  . PAP SMEAR  03/28/2014  . MAMMOGRAM  02/13/2016    _________________________________________________  HPI: [name] is a 55 y.o. female here for a routine f/u visit.  Pt has a PMH outlined below.  Please see problem-based charting assessment and plan for further status of patient's chronic medical problems addressed at today's visit.  PMH: Past Medical History  Diagnosis Date  . GERD (gastroesophageal reflux disease)   . Asthma     2 sets of PFT's in 04/09 without sign variability. Last set with significant decrease in FEV1 with saline alone, suggesting Asthma but  recommended clinical corelation   . Polysubstance abuse     none since March 17,2009.  Marland Kitchen Lower extremity edema     Neg ABI's, normal 2D echo, normal albumin  . Chronic headache   . Chronic pain     normal work up including TSH, RPR, B12, HIV, plain films, 2 ESR's, ANA, CK, RF along with routine CBC, CMET and UA. Further work up includes CRP, ESR, SPEP/UPEP, hepatitis erology, A1C , repeat ANA  . Bipolar 1 disorder [facility])     therapist is Kathleen [name] and is followed by [facility]  . Blackout     negative work up including ESR, ANA, opthalmology referral, carotid dopplers, 2D echo, MRI and EEG.  . DUB (dysfunctional uterine bleeding)     and pelvic pain, with negative endometrial bx in 07/09 and transvaginal U/S significant for mild fibroids in 0/09.  . 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 Asthma   . Bronchitis   . Sleep apnea     NO CPAP  . Menorrhagia   . Ovarian cyst     Medications: Current Outpatient Prescriptions on File Prior to Visit  Medication Sig Dispense Refill  . albuterol (PROVENTIL) (2.5 MG/3ML)  0.083% nebulizer solution Take 3 mLs (2.5 mg total) by nebulization every 6 (six) hours as needed for wheezing or shortness of breath. 75 mL 2  . citalopram (CELEXA) 40 MG tablet Take 1 tablet (40 mg total) by mouth daily. 30 tablet 2  . EPINEPHrine 0.3 mg/0.3 mL IJ SOAJ injection Inject 0.3 mLs (0.3 mg total) into the muscle once. 1 Device 1  . lidocaine (LIDODERM) 5 % Place 1 patch onto the skin daily. Remove & Discard patch within 12 hours or as directed by MD 30 patch 0  . meloxicam (MOBIC) 7.5 MG tablet Take 1 tablet (7.5 mg total) by mouth daily. 15 tablet 0  . naproxen (NAPROSYN) 500 MG tablet Take 1 tablet (500 mg total) by mouth 2 (two) times daily. 30 tablet 0  . omeprazole (PRILOSEC) 40 MG capsule Take 1 capsule (40 mg total) by mouth daily. 30 capsule 2   No current facility-administered medications on file prior to visit.    Allergies: Allergies  Allergen Reactions  . Topiramate Other (See Comments)    Reaction:  Hallucinations   . Tramadol Nausea Only    FH: Family History  Problem Relation Age of Onset  . Diabetes Father   . Hypertension Father   . Kidney disease Father   . Kidney disease Sister   .  Colon cancer Mother 85  . Colon cancer Father 79  . Prostate cancer Father     SH: Social History   Social History  . Marital Status: Single    Spouse Name: N/A  . Number of Children: N/A  . Years of Education: N/A   Social History Main Topics  . Smoking status: Former Smoker -- 0.25 packs/day for 30 years    Types: Cigarettes  . Smokeless tobacco: Never Used  . Alcohol Use: No  . Drug Use: No     Comment: former  . Sexual Activity: Not Asked     Comment: monogamous   Other Topics Concern  . None   Social History Narrative   ** Merged History Encounter **       Lives between Mom's house and boyfriend's house , currently trying to abstain from illegal substances, continues to smoke and says nicotine patch made her sick.    Review of  Systems: Constitutional: Negative for fever, chills.  Eyes: Negative for blurred vision.  Respiratory: Negative for cough and shortness of breath.  Cardiovascular: Negative for chest pain.  Gastrointestinal: Negative for nausea, vomiting. Musculoskeletal: +back and leg pain. Neurological: Negative for dizziness.   Objective:   Vital Signs: Filed Vitals:   03/24/16 1040  BP: 103/71  Pulse: 75  Temp: 98 F (36.7 C)  TempSrc: Oral  Height: 5' 2" (1.575 m)  Weight: 160 lb 11.2 oz (72.893 kg)  SpO2: 100%      BP Readings from Last 3 Encounters:  03/24/16 103/71  02/05/16 113/72  01/23/16 110/78    Physical Exam: Constitutional: Vital signs reviewed.  Patient is in NAD and cooperative with exam.  Head: Normocephalic and atraumatic. Eyes: EOMI, conjunctivae nl, no scleral icterus.  Neck: Supple. Cardiovascular: RRR. Pulmonary/Chest: Normal effort, diffuse mild expiratory wheezes, without rales or rhonchi. Abdominal: Soft. NT/ND +BS. Neurological: A&O x3, cranial nerves II-XII are grossly intact, moving all extremities. Extremities: No LE edema. Skin: Warm, dry and intact. No rash.   Assessment & Plan:   Assessment and plan was discussed and formulated with my attending.    

## 2016-03-24 NOTE — Assessment & Plan Note (Signed)
-  refilled omeprazole 20mg  qd

## 2016-03-24 NOTE — Assessment & Plan Note (Signed)
Pt reports "loose teeth" and is requesting a referral to dentistry. -referral to dentistry

## 2016-03-24 NOTE — Patient Instructions (Signed)
Thank you for your visit today.   Please return to the internal medicine clinic in about 4-5 months or sooner if needed.     I have made the following additions/changes to your medications:  I have [place] a prescription for advair [place] the county pharmacy to be taken for your asthma instead of dulera.   I have also sent a prescription for a rescue inhaler (albuterol) to the county pharmacy to be picked up.   I have made a referral for physical therapy and an a referral for your eyes. We will also need to make a dental referral .  Your refill of mobic has been sent to [facility].   Please be sure to bring all of your medications with you to every visit; this includes herbal supplements, vitamins, eye drops, and any over-the-counter medications.   Should you have any questions regarding your medications and/or any new or worsening symptoms, please be sure to call the clinic at [phone].   If you believe that you are suffering from a life threatening condition or one that may result in the loss of limb or function, then you should call 911 and proceed to the nearest Emergency Department.   A healthy lifestyle and preventative care can promote health and wellness.   Maintain regular health, dental, and eye exams.  Eat a healthy diet. Foods like vegetables, fruits, whole grains, low-fat dairy products, and lean protein foods contain the nutrients you need without too many calories. Decrease your intake of foods high in solid fats, added sugars, and salt. Get information about a proper diet from your caregiver, if necessary.  Regular physical exercise is one of the most important things you can do for your health. Most adults should get at least 150 minutes of moderate-intensity exercise (any activity that increases your heart rate and causes you to sweat) each week. In addition, most adults need muscle-strengthening exercises on 2 or more days a week.   Maintain a healthy weight. The body mass  index (BMI) is a screening tool to identify possible weight problems. It provides an estimate of body fat based on height and weight. Your caregiver can help determine your BMI, and can help you achieve or maintain a healthy weight. For adults 20 years and older:  A BMI below 18.5 is considered underweight.  A BMI of 18.5 to 24.9 is normal.  A BMI of 25 to 29.9 is considered overweight.  A BMI of 30 and above is considered obese.

## 2016-03-24 NOTE — Assessment & Plan Note (Signed)
Pt reports that for several years her vision goes black in both eyes for several seconds.  Usually happens about 1xmonth.  Does have history of HA.  Likely migraine.  She is requesting referral for eye exam. -referral to ophthamology

## 2016-03-24 NOTE — Assessment & Plan Note (Addendum)
She does not have insurance but has the orange card.  She is out of albuterol and dulera.  She reports cough and dyspnea.  No fever/chills.  She has mild diffuse expiratory wheezes on exam. No LE swelling.  -refill albuterol inhaler (she may get at the county pharmacy for $6) -switch dulera to advair which is available at the [facility] for also $6

## 2016-03-29 NOTE — Progress Notes (Signed)
Case discussed with Dr. Gill soon after the resident saw the patient.  We reviewed the resident's history and exam and pertinent patient test results.  I agree with the assessment, diagnosis, and plan of care documented in the resident's note. 

## 2016-04-13 ENCOUNTER — Ambulatory Visit: Payer: Self-pay | Admitting: Physical Therapy

## 2016-04-20 ENCOUNTER — Ambulatory Visit: Payer: Self-pay | Admitting: Physical Therapy

## 2016-04-20 NOTE — Addendum Note (Signed)
Addended by: Hulan Fray on: 04/20/2016 08:16 PM   Modules accepted: Orders

## 2016-05-26 ENCOUNTER — Encounter: Payer: Self-pay | Admitting: *Deleted

## 2016-06-28 NOTE — Addendum Note (Signed)
Addended by: Truddie Crumble on: 06/28/2016 01:42 PM   Modules accepted: Orders

## 2016-07-08 ENCOUNTER — Encounter (HOSPITAL_BASED_OUTPATIENT_CLINIC_OR_DEPARTMENT_OTHER): Payer: Self-pay | Admitting: Respiratory Therapy

## 2016-07-08 ENCOUNTER — Emergency Department (HOSPITAL_BASED_OUTPATIENT_CLINIC_OR_DEPARTMENT_OTHER): Payer: Self-pay

## 2016-07-08 ENCOUNTER — Emergency Department (HOSPITAL_BASED_OUTPATIENT_CLINIC_OR_DEPARTMENT_OTHER)
Admission: EM | Admit: 2016-07-08 | Discharge: 2016-07-08 | Disposition: A | Discharge status: Home / Self Care | Payer: Self-pay | Attending: Emergency Medicine | Admitting: Emergency Medicine

## 2016-07-08 DIAGNOSIS — J45909 Unspecified asthma, uncomplicated: Secondary | ICD-10-CM | POA: Insufficient documentation

## 2016-07-08 DIAGNOSIS — S81811A Laceration without foreign body, right lower leg, initial encounter: Secondary | ICD-10-CM | POA: Insufficient documentation

## 2016-07-08 DIAGNOSIS — Z7951 Long term (current) use of inhaled steroids: Secondary | ICD-10-CM | POA: Insufficient documentation

## 2016-07-08 DIAGNOSIS — Z87891 Personal history of nicotine dependence: Secondary | ICD-10-CM | POA: Insufficient documentation

## 2016-07-08 DIAGNOSIS — F319 Bipolar disorder, unspecified: Secondary | ICD-10-CM | POA: Insufficient documentation

## 2016-07-08 DIAGNOSIS — Y999 Unspecified external cause status: Secondary | ICD-10-CM | POA: Insufficient documentation

## 2016-07-08 DIAGNOSIS — Y929 Unspecified place or not applicable: Secondary | ICD-10-CM | POA: Insufficient documentation

## 2016-07-08 DIAGNOSIS — Y939 Activity, unspecified: Secondary | ICD-10-CM | POA: Insufficient documentation

## 2016-07-08 DIAGNOSIS — W268XXA Contact with other sharp object(s), not elsewhere classified, initial encounter: Secondary | ICD-10-CM | POA: Insufficient documentation

## 2016-07-08 MED ORDER — IPRATROPIUM-ALBUTEROL 0.5-2.5 (3) MG/3ML IN SOLN
RESPIRATORY_TRACT | Status: AC
  Administered 2016-07-08: 3 mL
  Filled 2016-07-08: qty 3

## 2016-07-08 MED ORDER — LIDOCAINE-EPINEPHRINE (PF) 1 %-1:200000 IJ SOLN
INTRAMUSCULAR | Status: AC
  Filled 2016-07-08: qty 30

## 2016-07-08 MED ORDER — TETANUS-DIPHTH-ACELL PERTUSSIS 5-2.5-18.5 LF-MCG/0.5 IM SUSP
0.5000 mL | Freq: Once | INTRAMUSCULAR | Status: AC
  Administered 2016-07-08: 0.5 mL via INTRAMUSCULAR
  Filled 2016-07-08: qty 0.5

## 2016-07-08 MED ORDER — OXYCODONE-ACETAMINOPHEN 5-325 MG PO TABS: 1.0000 | ORAL_TABLET | Freq: Four times a day (QID) | ORAL | 0 refills | Status: DC | PRN

## 2016-07-08 MED ORDER — OXYCODONE-ACETAMINOPHEN 5-325 MG PO TABS
1.0000 | ORAL_TABLET | Freq: Once | ORAL | Status: AC
  Administered 2016-07-08: 1 via ORAL
  Filled 2016-07-08: qty 1

## 2016-07-08 MED ORDER — ONDANSETRON HCL 4 MG/2ML IJ SOLN
4.0000 mg | Freq: Once | INTRAMUSCULAR | Status: AC
  Administered 2016-07-08: 4 mg via INTRAVENOUS
  Filled 2016-07-08: qty 2

## 2016-07-08 MED ORDER — LIDOCAINE-EPINEPHRINE-TETRACAINE (LET) SOLUTION
3.0000 mL | Freq: Once | NASAL | Status: AC
  Administered 2016-07-08: 3 mL via TOPICAL
  Filled 2016-07-08: qty 3

## 2016-07-08 MED FILL — OXYCODONE/APAP 5-325: 5-325 | 2 days supply | Qty: 10 | Fill #0

## 2016-07-08 NOTE — ED Provider Notes (Signed)
[place] DEPT [facility] Provider Note   CSN: [id number] Arrival date & time: 07/08/16  6962  First Provider Contact:  First MD Initiated Contact with Patient 07/08/16 0940        History   Chief Complaint Chief Complaint  Patient presents with  . Laceration    HPI Kathleen [name] is a 55 y.o. female.  Patient came in very upset and distressed from the laceration on her leg. She started to state that she could not breathe and felt that she was having an asthma attack.   The history is provided by the patient.  Laceration   The incident occurred less than 1 hour ago. The laceration is located on the right leg. The laceration is 3 cm in size. The laceration mechanism was a broken glass (cleaning out the refrigerator and a bottle of dressing broke and cut the back of her ankle). The pain is at a severity of 8/10. The pain is severe. The pain has been constant since onset. She reports no foreign bodies present.    Past Medical History:  Diagnosis Date  . Asthma    2 sets of PFT's in 04/09 without sign variability. Last set with significant decrease in FEV1 with saline alone, suggesting Asthma but  recommended clinical corelation   . Asthma   . Bipolar 1 disorder [facility])    therapist is Caryl Asp and is followed by [facility]  . Blackout    negative work up including ESR, ANA, opthalmology referral, carotid dopplers, 2D echo, MRI and EEG.  . 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 Bronchitis   . Chronic headache   . Chronic pain    normal work up including TSH, RPR, B12, HIV, plain films, 2 ESR's, ANA, CK, RF along with routine CBC, CMET and UA. Further work up includes CRP, ESR, SPEP/UPEP, hepatitis erology, A1C , repeat ANA  . DUB (dysfunctional uterine bleeding)    and pelvic pain, with negative endometrial bx in 07/09 and transvaginal U/S significant for mild fibroids in 0/09.  Marland Kitchen GERD (gastroesophageal reflux disease)   .  Lower extremity edema    Neg ABI's, normal 2D echo, normal albumin  . Menorrhagia   . Ovarian cyst   . Polysubstance abuse    none since March 17,2009.  Marland Kitchen Sleep apnea    NO CPAP    Patient Active Problem List   Diagnosis Date Noted  . Tooth loose 03/24/2016  . Left hip pain 02/05/2016  . Depression 09/19/2015  . Asthmatic bronchitis 09/19/2015  . Hot flashes 02/05/2015  . Flashing lights seen 02/05/2015  . Abnormal uterine bleeding 07/24/2014  . Left knee pain 06/18/2014  . Healthcare maintenance 06/18/2014  . Menorrhagia 05/24/2014  . Dyspareunia 05/24/2014  . Breast lump in female and tenderness 05/24/2014  . Protein-calorie malnutrition, severe ([place]) 05/15/2014  . Asthma 05/13/2014  . Neck pain on left side 01/19/2012  . DVT 12/21/2010  . ABNORMAL VAGINAL BLEEDING 12/21/2010  . BIPOLAR DISORDER UNSPECIFIED 01/21/2010  . GERD 01/21/2010  . CHEST PAIN, ATYPICAL 01/21/2010  . ARTHRITIS, GENERALIZED 10/08/2008  . SNORING 10/08/2008  . DISTURBANCE OF SKIN SENSATION 08/29/2008  . Chronic low back pain 08/08/2008  . INSOMNIA 05/31/2008  . VISION LOSS, SUDDEN 03/25/2008  . BREAST PAIN, BILATERAL 03/25/2008  . BREAST LUMP 03/25/2008  . LEG PAIN, BILATERAL 03/19/2008  . HALLUCINATIONS 03/18/2008  . GENERALIZED ANXIETY DISORDER 03/14/2008  . DRUG ABUSE 03/14/2008  .  MIGRAINE VARIANT 03/14/2008    Past Surgical History:  Procedure Laterality Date  . CESAREAN SECTION    . CESAREAN SECTION    . HERNIA REPAIR    . Left partial oophorectomy    . OOPHORECTOMY     1/2 ovary removed  . VAGINA SURGERY     mesh    OB History    Gravida Para Term Preterm AB Living   '3 2 2 ' 0 1     SAB TAB Ectopic Multiple Live Births   0 1 0           Home Medications    Prior to Admission medications   Medication Sig Start Date End Date Taking? Authorizing Provider  albuterol (PROVENTIL HFA;VENTOLIN HFA) 108 (90 Base) MCG/ACT inhaler Inhale 2 puffs into the lungs every 4 (four)  hours as needed for wheezing or shortness of breath. 03/24/16  Yes Jones Bales, MD  albuterol (PROVENTIL) (2.5 MG/3ML) 0.083% nebulizer solution Take 3 mLs (2.5 mg total) by nebulization every 6 (six) hours as needed for wheezing or shortness of breath. 12/17/15  Yes Alexa Angela Burke, MD  EPINEPHrine 0.3 mg/0.3 mL IJ SOAJ injection Inject 0.3 mLs (0.3 mg total) into the muscle once. 09/17/15  Yes Domenic Moras, PA-C  Fluticasone-Salmeterol (ADVAIR DISKUS) 250-50 MCG/DOSE AEPB Inhale 1 puff into the lungs 2 (two) times daily. 03/24/16 03/24/17 Yes Jones Bales, MD  lidocaine (LIDODERM) 5 % Place 1 patch onto the skin daily. Remove & Discard patch within 12 hours or as directed by MD 12/17/15  Yes Alexa Angela Burke, MD  meloxicam (MOBIC) 7.5 MG tablet Take 1 tablet (7.5 mg total) by mouth daily. 03/24/16 03/24/17 Yes Jones Bales, MD  omeprazole (PRILOSEC) 20 MG capsule Take 1 capsule (20 mg total) by mouth daily. 03/24/16 03/24/17 Yes Jones Bales, MD  OXYCODONE ER PO Take by mouth.   Yes Historical Provider, MD    Family History Family History  Problem Relation Age of Onset  . Colon cancer Mother 64  . Diabetes Father   . Hypertension Father   . Kidney disease Father   . Colon cancer Father 66  . Prostate cancer Father   . Kidney disease Sister     Social History Social History  Substance Use Topics  . Smoking status: Former Smoker    Packs/day: 0.25    Years: 30.00    Types: Cigarettes  . Smokeless tobacco: Never Used  . Alcohol use No     Allergies   Naprosyn [naproxen]; Topiramate; and Tramadol   Review of Systems Review of Systems  HENT: Positive for sore throat.        For the last 2 days patient has had a sore throat with pain on the left side. States that sometimes she cannot swallow but that is not constant. She is planning on following up with her regular doctor  All other systems reviewed and are negative.    Physical Exam Updated Vital Signs BP 122/97 (BP  Location: Right Arm)   Pulse 80   Temp 98.7 F (37.1 C) (Oral)   Resp 24   Ht '5\' 2"'  (1.575 m)   Wt 160 lb (72.6 kg)   SpO2 100%   BMI 29.26 kg/m   Physical Exam  Constitutional: She is oriented to person, place, and time. She appears well-developed and well-nourished. No distress.  HENT:  Head: Normocephalic and atraumatic.  Right Ear: External ear normal.  Left Ear: External ear normal.  Mouth/Throat: Oropharynx is clear and moist.  Mild left tonsillar tenderness.  No exudates or swelling  Eyes: Conjunctivae and EOM are normal. Pupils are equal, round, and reactive to light. Right eye exhibits no discharge.  Neck: Normal range of motion. Neck supple. No tracheal deviation present. No thyromegaly present.  Cardiovascular: Normal rate, regular rhythm, normal heart sounds and intact distal pulses.   No murmur heard. Pulmonary/Chest: Tachypnea noted. No respiratory distress. She has no decreased breath sounds. She has wheezes. She has no rhonchi. She has no rales.  hyperventilating  Abdominal: Soft. There is no tenderness.  Musculoskeletal: Normal range of motion. She exhibits tenderness. She exhibits no edema.       Feet:  Lymphadenopathy:    She has no cervical adenopathy.  Neurological: She is alert and oriented to person, place, and time.  Skin: Skin is warm and dry. No rash noted.  Psychiatric: She has a normal mood and affect.     ED Treatments / Results  Labs (all labs ordered are listed, but only abnormal results are displayed) Labs Reviewed - No data to display  EKG  EKG Interpretation None       Radiology Dg Ankle Complete Right  Result Date: 07/08/2016 CLINICAL DATA:  Patient had a glass bottle fall on the ankle. Cut. Pain. EXAM: RIGHT ANKLE - COMPLETE 3+ VIEW COMPARISON:  None. FINDINGS: There is no evidence of fracture, dislocation, or joint effusion. There is no evidence of arthropathy or other focal bone abnormality. There is a soft tissue laceration  posteriorly. Mild soft tissue swelling is seen diffusely. There is no visible radiopaque foreign body. IMPRESSION: Negative for fracture or radiopaque foreign body. Laceration posteriorly. Electronically Signed   By: Staci Righter M.D.   On: 07/08/2016 10:19   Procedures Procedures (including critical care time)  Medications Ordered in ED Medications  lidocaine-EPINEPHrine (XYLOCAINE-EPINEPHrine) 1 %-1:200000 (PF) injection (not administered)  ipratropium-albuterol (DUONEB) 0.5-2.5 (3) MG/3ML nebulizer solution (not administered)  lidocaine-EPINEPHrine-tetracaine (LET) solution (not administered)     Initial Impression / Assessment and Plan / ED Course  I have reviewed the triage vital signs and the nursing notes.  Pertinent labs & imaging results that were available during my care of the patient were reviewed by me and considered in my medical decision making (see chart for details).  Clinical Course    Patient presents with laceration to the right posterior ankle area when a bottle dressing fell on her leg and cut her. Her daughter states it started bleeding immediately and they bandaged it and brought her directly here. She complains of feeling slight numbness in her foot but she is able to flex and extend the foot without difficulty with low suspicion for Achilles tendon injury. She can feel sensation with palpation over the toes and foot but claims it is decreased. She has 2+ DP pulse.  During this event patient became very upset and started to have an asthma attack. She has bilateral wheezing and was given an albuterol nab. Wound repair as recorded.   Pt given tetanus shot and imaging without foreign body  LACERATION REPAIR Performed by: Blanchie Dessert Authorized byBlanchie Dessert Consent: Verbal consent obtained. Risks and benefits: risks, benefits and alternatives were discussed Consent given by: patient Patient identity confirmed: provided demographic data Prepped and  Draped in normal sterile fashion Wound explored  Laceration Location: right posterior ankle  Laceration Length: 2cm  No Foreign Bodies seen or palpated  Anesthesia: local infiltration  Local anesthetic: lidocaine 1% with epinephrine  Anesthetic total: 4 ml  Irrigation method: syringe Amount of cleaning: standard  Skin closure: 4.0 vicryl  Number of sutures: 6  Technique: simple interrupted  Patient tolerance: Patient tolerated the procedure well with no immediate complications.   Final Clinical Impressions(s) / ED Diagnoses   Final diagnoses:  Leg laceration, right, initial encounter    New Prescriptions New Prescriptions   OXYCODONE-ACETAMINOPHEN (PERCOCET/ROXICET) 5-325 MG TABLET    Take 1-2 tablets by mouth every 6 (six) hours as needed for severe pain.     Blanchie Dessert, MD 07/08/16 1048

## 2016-07-08 NOTE — ED Notes (Signed)
MD at bedside. 

## 2016-07-08 NOTE — ED Notes (Signed)
Pt states that she has been unable to swallow for the past 24 hours.

## 2016-07-08 NOTE — ED Triage Notes (Addendum)
Pt with laceration to backside of right ankle after a glass broke in the refrigerator. Pt extremely anxious. Last tetanus unknown.

## 2016-07-08 NOTE — ED Notes (Signed)
Pt states she cannot breath, states her asthma is flaring up. RT at side to give neb treatment per Dr. Maryan Rued.

## 2016-07-16 ENCOUNTER — Ambulatory Visit: Payer: Self-pay

## 2016-07-20 ENCOUNTER — Ambulatory Visit (INDEPENDENT_AMBULATORY_CARE_PROVIDER_SITE_OTHER): Payer: Self-pay | Admitting: Internal Medicine

## 2016-07-20 ENCOUNTER — Encounter (INDEPENDENT_AMBULATORY_CARE_PROVIDER_SITE_OTHER): Payer: Self-pay

## 2016-07-20 VITALS — BP 118/90 | HR 64 | Temp 97.7°F | Ht 62.0 in | Wt 159.6 lb

## 2016-07-20 DIAGNOSIS — B9689 Other specified bacterial agents as the cause of diseases classified elsewhere: Secondary | ICD-10-CM

## 2016-07-20 DIAGNOSIS — S91001D Unspecified open wound, right ankle, subsequent encounter: Secondary | ICD-10-CM

## 2016-07-20 DIAGNOSIS — Y9389 Activity, other specified: Secondary | ICD-10-CM

## 2016-07-20 DIAGNOSIS — Y92000 Kitchen of unspecified non-institutional (private) residence as  the place of occurrence of the external cause: Secondary | ICD-10-CM

## 2016-07-20 DIAGNOSIS — W25XXXD Contact with sharp glass, subsequent encounter: Secondary | ICD-10-CM

## 2016-07-20 DIAGNOSIS — T148XXA Other injury of unspecified body region, initial encounter: Principal | ICD-10-CM

## 2016-07-20 DIAGNOSIS — T814XXA Infection following a procedure, initial encounter: Secondary | ICD-10-CM

## 2016-07-20 DIAGNOSIS — L089 Local infection of the skin and subcutaneous tissue, unspecified: Secondary | ICD-10-CM

## 2016-07-20 MED ORDER — KETOROLAC TROMETHAMINE 30 MG/ML IM SOLN: 30.0000 mg | Freq: Once | INTRAMUSCULAR | 0 refills | Status: DC

## 2016-07-20 MED ORDER — DOXYCYCLINE HYCLATE 100 MG PO CAPS: 100.0000 mg | ORAL_CAPSULE | Freq: Two times a day (BID) | ORAL | 0 refills | Status: DC

## 2016-07-20 MED ORDER — KETOROLAC TROMETHAMINE 30 MG/ML IJ SOLN
30.0000 mg | Freq: Once | INTRAMUSCULAR | Status: AC
  Administered 2016-07-20: 30 mg via INTRAVENOUS

## 2016-07-20 MED ORDER — OXYCODONE-ACETAMINOPHEN 5-325 MG PO TABS: 1.0000 | ORAL_TABLET | Freq: Four times a day (QID) | ORAL | 0 refills | Status: DC | PRN

## 2016-07-20 NOTE — Progress Notes (Signed)
    CC: right foot pain  HPI: Ms.Jamal F Mickle is a 55 y.o. female with PMHx of Asthma, Bipolar Disorder who presents to the clinic for right ankle pain.   On 7/27, patient was cleaning out a fridge when a bottle of hot sauce fell and shattered. A piece of glass cut her on her medial posterior ankle. Xray at that time showed no evidence of radiopaque material in the wound. Patient was given sutures and antibacterial ointment and instructed to have sutures removed on 8/4. Patient still has sutures in place. Patient states she has had persistent severe pain and difficulty ambulating. She stopped using her crutches 3 days ago. She reports swelling at her ankle which as improved. She admits to chills but denies fever. She admits to yellow drainage from the would for the last 3 days. She denies nausea, vomiting.   Past Medical History:  Diagnosis Date  . Asthma    2 sets of PFT's in 04/09 without sign variability. Last set with significant decrease in FEV1 with saline alone, suggesting Asthma but  recommended clinical corelation   . Asthma   . Bipolar 1 disorder [facility])    therapist is Caryl Asp and is followed by [employer]  . Blackout    negative work up including ESR, ANA, opthalmology referral, carotid dopplers, 2D echo, MRI and EEG.  . 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 Bronchitis   . Chronic headache   . Chronic pain    normal work up including TSH, RPR, B12, HIV, plain films, 2 ESR's, ANA, CK, RF along with routine CBC, CMET and UA. Further work up includes CRP, ESR, SPEP/UPEP, hepatitis erology, A1C , repeat ANA  . DUB (dysfunctional uterine bleeding)    and pelvic pain, with negative endometrial bx in 07/09 and transvaginal U/S significant for mild fibroids in 0/09.  Marland Kitchen GERD (gastroesophageal reflux disease)   . Lower extremity edema    Neg ABI's, normal 2D echo, normal albumin  . Menorrhagia   . Ovarian cyst   . Polysubstance  abuse    none since March 17,2009.  Marland Kitchen Sleep apnea    NO CPAP    Review of Systems: A complete ROS was negative except as noted in HPI.   Physical Exam: Vitals:   07/20/16 1604  BP: 118/90  Pulse: 64  Temp: 97.7 F (36.5 C)  TempSrc: Oral  SpO2: 100%  Weight: 159 lb 9.6 oz (72.4 kg)  Height: '5\' 2"'$  (1.575 m)   General: Vital signs reviewed.  Patient is well-developed and well-nourished, in no acute distress and cooperative with exam.  Cardiovascular: RRR, S1 normal, S2 normal, no murmurs, gallops, or rubs. Pulmonary/Chest: Clear to auscultation bilaterally, no wheezes, rales, or rhonchi. Abdominal: Soft, non-tender, non-distended, BS + Extremities: Right posterior medial ankle with laceration s/p suture placement. Skin surrounding laceration is mildly erythematous, no obvious drainage. No foul odor. Area is tender and small amount of fluctuance noted near suture site. Decreased dorsiflexion due to pain, normal plantar flexion. Mild surrounding edema of right ankle. Pulses symmetric and intact bilaterally. Sensory intact to light touch bilaterally.  Psychiatric: Normal mood and affect. speech and behavior is normal. Cognition and memory are normal.   Assessment & Plan:  See encounters tab for problem based medical decision making. Patient discussed with Dr. Daryll Drown

## 2016-07-20 NOTE — Patient Instructions (Signed)
TAKE DOXYCYCLINE 100 MG TWICE A DAY FOR 5 DAYS.  YOU CAN TAKE OXYCODONE FOR PAIN EVERY 6 HOURS AS NEEDED.  RETURN TO CLINIC ON Friday FOR FOLLOW UP

## 2016-07-20 NOTE — Assessment & Plan Note (Signed)
On 7/27, patient was cleaning out a fridge when a bottle of hot sauce fell and shattered. A piece of glass cut her on her medial posterior ankle. Xray at that time showed no evidence of radiopaque material in the wound. Patient was given sutures and antibacterial ointment and instructed to have sutures removed on 8/4. Patient still has sutures in place. Patient states she has had persistent severe pain and difficulty ambulating. She stopped using her crutches 3 days ago. She reports swelling at her ankle which as improved. She admits to chills but denies fever. She admits to yellow drainage from the would for the last 3 days. She denies nausea, vomiting. Physical exam shows right posterior medial ankle with laceration s/p suture placement. Skin surrounding laceration is mildly erythematous, no obvious drainage. No foul odor. Area is tender and small amount of fluctuance noted near suture site. Decreased dorsiflexion due to pain, normal plantar flexion. Mild surrounding edema of right ankle. Pulses symmetric and intact bilaterally. Sensory intact to light touch bilaterally.    Assessment: Superimposed wound infection  Plan: -Doxycycline 100 mg BID for 5 days -Follow up on 07/23/16 -Oxycodone Q6H prn pain -If pain and edema are persistent without improvement, will consider CT scan of area for further evaluation as we would have expected patient to improve by now -If pain and edema are improved, will consider suture removal on 8/11

## 2016-07-22 NOTE — Progress Notes (Signed)
Internal Medicine Clinic Attending  I saw and evaluated the patient.  I personally confirmed the key portions of the history and exam documented by Dr. Burns and I reviewed pertinent patient test results.  The assessment, diagnosis, and plan were formulated together and I agree with the documentation in the resident's note. 

## 2016-07-23 ENCOUNTER — Ambulatory Visit: Payer: Self-pay

## 2016-07-24 ENCOUNTER — Other Ambulatory Visit: Payer: Self-pay | Admitting: Internal Medicine

## 2016-07-24 DIAGNOSIS — T148XXA Other injury of unspecified body region, initial encounter: Principal | ICD-10-CM

## 2016-07-24 DIAGNOSIS — L089 Local infection of the skin and subcutaneous tissue, unspecified: Secondary | ICD-10-CM

## 2016-07-26 ENCOUNTER — Encounter (INDEPENDENT_AMBULATORY_CARE_PROVIDER_SITE_OTHER): Payer: Self-pay

## 2016-07-26 ENCOUNTER — Ambulatory Visit (INDEPENDENT_AMBULATORY_CARE_PROVIDER_SITE_OTHER): Payer: Self-pay | Admitting: Internal Medicine

## 2016-07-26 VITALS — BP 123/69 | HR 56 | Temp 98.1°F | Ht 62.0 in | Wt 162.6 lb

## 2016-07-26 DIAGNOSIS — M5416 Radiculopathy, lumbar region: Secondary | ICD-10-CM

## 2016-07-26 DIAGNOSIS — T148XXA Other injury of unspecified body region, initial encounter: Principal | ICD-10-CM

## 2016-07-26 DIAGNOSIS — L089 Local infection of the skin and subcutaneous tissue, unspecified: Secondary | ICD-10-CM

## 2016-07-26 DIAGNOSIS — X58XXXD Exposure to other specified factors, subsequent encounter: Secondary | ICD-10-CM

## 2016-07-26 DIAGNOSIS — S91011D Laceration without foreign body, right ankle, subsequent encounter: Secondary | ICD-10-CM

## 2016-07-26 DIAGNOSIS — L0889 Other specified local infections of the skin and subcutaneous tissue: Secondary | ICD-10-CM

## 2016-07-26 DIAGNOSIS — M79671 Pain in right foot: Secondary | ICD-10-CM

## 2016-07-26 MED ORDER — KETOROLAC TROMETHAMINE 30 MG/ML IJ SOLN
30.0000 mg | Freq: Once | INTRAMUSCULAR | Status: AC
  Administered 2016-07-26: 30 mg via INTRAMUSCULAR

## 2016-07-26 MED ORDER — MELOXICAM 7.5 MG PO TABS: 7.5000 mg | ORAL_TABLET | Freq: Every day | ORAL | 0 refills | Status: DC

## 2016-07-26 NOTE — Assessment & Plan Note (Signed)
Patient presents for follow up for right foot wound sustained 2 weeks ago. Area continues to be painful. Patient feels her pan is the same but the redness and swelling have improved after a course of doxycycline 100 mg BID. Physical exam shows right posterior medial ankle with well healed laceration with sutures in place. No evidence of erythema, no obvious drainage, no foul odor. Area is tender with a small amount of fluctuance noted near suture site. Decreased dorsiflexion and plantar flexion due to pain, normal plantar flexion. Mild surrounding edema of right ankle. Pulses symmetric and intact bilaterally. Sensory intact to light touch bilaterally.   Overall, wound appears non-infected and improving; however, pain persists. If pain persists without improvement in one week, will proceed with CT scan of the right lower extremity. Sutures removed.  Plan: -Mobic 7.5 mg QD -If no improvement in 1 week, consider CT scan

## 2016-07-26 NOTE — Progress Notes (Signed)
    CC: wound infection  HPI: [name] is a 55 y.o. female with PMHx of Bipolar 1 Disorder who presents to the clinic for follow up for a wound infection. Please see problem based assessment and plan for more information.   Past Medical History:  Diagnosis Date  . Asthma    2 sets of PFT's in 04/09 without sign variability. Last set with significant decrease in FEV1 with saline alone, suggesting Asthma but  recommended clinical corelation   . Asthma   . Bipolar 1 disorder [facility])    therapist is Caryl Asp and is followed by [employer]  . Blackout    negative work up including ESR, ANA, opthalmology referral, carotid dopplers, 2D echo, MRI and EEG.  . 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 Bronchitis   . Chronic headache   . Chronic pain    normal work up including TSH, RPR, B12, HIV, plain films, 2 ESR's, ANA, CK, RF along with routine CBC, CMET and UA. Further work up includes CRP, ESR, SPEP/UPEP, hepatitis erology, A1C , repeat ANA  . DUB (dysfunctional uterine bleeding)    and pelvic pain, with negative endometrial bx in 07/09 and transvaginal U/S significant for mild fibroids in 0/09.  Marland Kitchen GERD (gastroesophageal reflux disease)   . Lower extremity edema    Neg ABI's, normal 2D echo, normal albumin  . Menorrhagia   . Ovarian cyst   . Polysubstance abuse    none since March 17,2009.  Marland Kitchen Sleep apnea    NO CPAP    Review of Systems: A complete ROS was negative except as noted in HPI.   Physical Exam: Vitals:   07/26/16 1536  BP: 123/69  Pulse: (!) 56  Temp: 98.1 F (36.7 C)  TempSrc: Oral  SpO2: 100%  Weight: 162 lb 9.6 oz (73.8 kg)  Height: '5\' 2"'$  (1.575 m)   General: Vital signs reviewed.  Patient is well-developed and well-nourished, in no acute distress and cooperative with exam.  Cardiovascular: RRR, S1 normal, S2 normal, no murmurs, gallops, or rubs. Pulmonary/Chest: Clear to auscultation bilaterally, no  wheezes, rales, or rhonchi. Abdominal: Soft, non-tender, non-distended, BS + Extremities: Right posterior medial ankle with well healed laceration with sutures in place. No evidence of erythema, no obvious drainage, no foul odor. Area is tender with a small amount of fluctuance noted near suture site. Decreased dorsiflexion and plantar flexion due to pain, normal plantar flexion. Mild surrounding edema of right ankle. Pulses symmetric and intact bilaterally. Sensory intact to light touch bilaterally.  Psychiatric: Normal mood and affect. speech and behavior is normal. Cognition and memory are normal.   Assessment & Plan:  See encounters tab for problem based medical decision making. Patient discussed with Dr. Daryll Drown

## 2016-07-26 NOTE — Assessment & Plan Note (Signed)
Patient complains of acute on chronic low back pain radiating from her low back to right hip and down her right lower extremity. Pain is worse in certain positions and relieved by laying down. She has tried NSAIDs in the past with relief. She denies urinary or stool incontinence.   Assessment: Lumbar Radiculopathy. Doubt trochanteric bursitis or sacroilitis  Plan: -Mobic 7.5 mg QD

## 2016-07-26 NOTE — Patient Instructions (Signed)
FOR YOUR FOOT AND HIP PAIN, TAKE MELOXICAM 7.5 MG ONCE A DAY WITH FOOD.   IF YOUR PAIN IS NOT IMPROVING IN ONE WEEK, PLEASE CALL AND WE WILL ORDER A CT SCAN.

## 2016-08-05 NOTE — Progress Notes (Signed)
Internal Medicine Clinic Attending  I saw and evaluated the patient.  I personally confirmed the key portions of the history and exam documented by Dr. Burns and I reviewed pertinent patient test results.  The assessment, diagnosis, and plan were formulated together and I agree with the documentation in the resident's note. 

## 2016-08-10 ENCOUNTER — Ambulatory Visit (HOSPITAL_COMMUNITY)
Admission: RE | Admit: 2016-08-10 | Discharge: 2016-08-10 | Disposition: A | Discharge status: Home / Self Care | Payer: Self-pay | Source: Ambulatory Visit | Attending: Internal Medicine | Admitting: Internal Medicine

## 2016-08-10 ENCOUNTER — Ambulatory Visit (INDEPENDENT_AMBULATORY_CARE_PROVIDER_SITE_OTHER): Payer: Self-pay | Admitting: Internal Medicine

## 2016-08-10 ENCOUNTER — Telehealth: Payer: Self-pay | Admitting: *Deleted

## 2016-08-10 DIAGNOSIS — M25571 Pain in right ankle and joints of right foot: Secondary | ICD-10-CM | POA: Insufficient documentation

## 2016-08-10 DIAGNOSIS — R1310 Dysphagia, unspecified: Secondary | ICD-10-CM

## 2016-08-10 DIAGNOSIS — S86019A Strain of unspecified Achilles tendon, initial encounter: Secondary | ICD-10-CM | POA: Insufficient documentation

## 2016-08-10 DIAGNOSIS — M5416 Radiculopathy, lumbar region: Secondary | ICD-10-CM

## 2016-08-10 MED ORDER — MELOXICAM 7.5 MG PO TABS: 7.5000 mg | ORAL_TABLET | Freq: Every day | ORAL | 0 refills | Status: DC

## 2016-08-10 NOTE — Patient Instructions (Signed)
Thank you for your visit today  Please do the XRAY of the right ankle. If it is normal, then I will call you to do the CT scan of the right foot .  Please follow up with the ENT doctor about your trouble swallowing

## 2016-08-10 NOTE — Progress Notes (Signed)
   CC: odynophagia and continuing right ankle swelling and pain HPI: Ms.Marycatherine F Heringer is a 55 y.o. woman with PMH noted below here for odynophagia and continuing right ankle pain and swelling   Please see Problem List/A&P for the status of the patient's chronic medical problems   Past Medical History:  Diagnosis Date  . Asthma    2 sets of PFT's in 04/09 without sign variability. Last set with significant decrease in FEV1 with saline alone, suggesting Asthma but  recommended clinical corelation   . Asthma   . Bipolar 1 disorder [facility])    therapist is Caryl Asp and is followed by [employer]  . Blackout    negative work up including ESR, ANA, opthalmology referral, carotid dopplers, 2D echo, MRI and EEG.  . 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 Bronchitis   . Chronic headache   . Chronic pain    normal work up including TSH, RPR, B12, HIV, plain films, 2 ESR's, ANA, CK, RF along with routine CBC, CMET and UA. Further work up includes CRP, ESR, SPEP/UPEP, hepatitis erology, A1C , repeat ANA  . DUB (dysfunctional uterine bleeding)    and pelvic pain, with negative endometrial bx in 07/09 and transvaginal U/S significant for mild fibroids in 0/09.  Marland Kitchen GERD (gastroesophageal reflux disease)   . Lower extremity edema    Neg ABI's, normal 2D echo, normal albumin  . Menorrhagia   . Ovarian cyst   . Polysubstance abuse    none since March 17,2009.  Marland Kitchen Sleep apnea    NO CPAP    Review of Systems:  Constitutional: Negative for fever, chills, thinks that she lost some weight.  HEENT: No headaches. Has dysphagia and odynophagia with both solids and liquids.  Respiratory: Negative for cough, SOB Gastrointestinal: Negative for nausea, vomiting, abdominal pain, but has regurgitation of food with swallowing.  Physical Exam: Vitals:   08/10/16 1003  BP: 106/78  Pulse: 66  Temp: 98.2 F (36.8 C)  TempSrc: Oral  SpO2: 100%  Weight: 156  lb 3.2 oz (70.9 kg)  Height: _0  (1.575 m)    General: A&O, in NAD HEENT: MMM, no obvious erythema or exudates. Oropharynx is moist and clear Neck: supple, midline trachea, has a 0.5 cm left mobile lymph node that is tender CV: RRR, normal s1, s2, no m/r/g,  Resp: equal and symmetric breath sounds, no wheezing or crackles  Abdomen: soft, nontender, nondistended, +BS Right foot: no erythema, but has some swelling of the right ankle at the site of injury. No drainage.    Assessment & Plan:   See encounters tab for problem based medical decision making. Patient discussed with Dr. Eppie Gibson

## 2016-08-10 NOTE — Assessment & Plan Note (Signed)
Pt is here for 3 week duration of odynophagia and dysphagia. She says that she does not have trouble initiating a swallow, but right after she swallows, she has a sensation that something is stuck in her throat, and she can not swallow further and so she has resulting regurgitation. This happens every day for the last 3 weeks. She is very worried about a cancer as 'it runs in her family'. She feels there is a knot in her left throat. She also has some associated nausea and feels that she may have lost some weight. FH of prostate cancer and colon cancer in parents. She denies any prior throat surgeries or radiation. She does have GERD but says it is well controlled. She has a 50 pack year smoking history, and she quit 1 year ago. On exam, oropharynx clear, but there is a 0.5 cm mobile lymph node on the left throat.  I gave her a glass of water in the room, and she was able to take a sip of water without any difficulty.    Plan -I referred her to ENT for endoscopy if there may be a cyst or other structural anomalies. -If the nasoendoscopy is normal, then next step would be to do EGD.

## 2016-08-10 NOTE — Assessment & Plan Note (Signed)
Patient follows up after a laceration to his right foot behind the medial malleolus last month. She was seen on August 14th by Dr Quay Burow and sutures were removed. She continues to have pain around the are and  is not able to bear weight on the right foot. She denies fevers or other systemic symptoms. She denies any drainage . On exam, the right ankle is mildly swollen, but no erythema or warmth. There is no drainage or fluctuance.  We obtained a stat xray which was negative.  Plan -Order CT scan of the right foot -I left voicemail to the pt that the xray was negative at 08/10/2016 at 2:10 PM, and asked her to do a CT, and call clinic for any questions.

## 2016-08-10 NOTE — Telephone Encounter (Signed)
Call to patient to notify her of the need for further follow up for her foot.  Patient informed of the need for a CT Scan per order of Dr. Tarry Kos.  Patient informed that she will be called with an appointment.  Patient voiced understanding of the plan.  Sharol Harness 08/10/2016 4:17 PM

## 2016-08-11 ENCOUNTER — Telehealth: Payer: Self-pay | Admitting: *Deleted

## 2016-08-11 ENCOUNTER — Encounter: Payer: Self-pay | Admitting: Pharmacist

## 2016-08-11 DIAGNOSIS — R1312 Dysphagia, oropharyngeal phase: Secondary | ICD-10-CM

## 2016-08-11 NOTE — Telephone Encounter (Signed)
Spoke to Dr Tiburcio Pea and Holley Raring. Sounds like oropharyngeal dysphagia. ENT referral in but appt likely delayed with orange card. W/U for oropharyngeal dysphagia starts with videofluoroscopy - will order. Dr Chauncey Cruel asked front desk to sch appt early next week.  To come sooner if sig increase in blood, pain, SOB, CP, any other concerning factor.

## 2016-08-11 NOTE — Telephone Encounter (Signed)
Call from pt - states she was seen yesterday for "knot" in her throat. Now she throwing up blood which she did not have yesterday; not a lot  -sees small specks in her food. When she tries to eat something, nothing stays down due to the knot. States blood red in color. Very concern.  ENT referral ordered yesterday.

## 2016-08-11 NOTE — Telephone Encounter (Signed)
Pt called / informed of MBS -stated someone already called/scheduleded procedure. Also informed to call if increase blood, pain, SOB, CP or any other concerns per Dr Lynnae January. Voiced understanding. Then stated needed albuterol inhaler; spoke w/Jen Maudie Mercury. Will pick up after lunch.

## 2016-08-11 NOTE — Progress Notes (Signed)
Medication Samples have been provided to the patient.  Drug: albuterol (Proventil) Strength: 90 mcg/puff Qty: 1 LOT: [id number] Exp.Date: 4/19 Dosing instructions: 2 puffs every 4 hours as needed for shortness of breath or wheezing  The patient has been instructed regarding the correct time, dose, and frequency of taking this medication, including desired effects and most common side effects.   Mystie Ormand J 4:35 PM 08/11/2016

## 2016-08-12 NOTE — Progress Notes (Signed)
I saw and evaluated the patient. I personally confirmed the key portions of Dr. Sherlynn Carbon history and exam and reviewed pertinent patient test results. The assessment, diagnosis, and plan were formulated together and I agree with the documentation in the resident's note.  If the ankle CT is negative will repeat a detailed ankle examination.  Dr. Tiburcio Pea believes her symptoms are proximal to the esophagus, hence why we are starting with an ENT evaluation.

## 2016-08-13 ENCOUNTER — Ambulatory Visit (HOSPITAL_COMMUNITY)
Admission: RE | Admit: 2016-08-13 | Discharge: 2016-08-13 | Disposition: A | Discharge status: Home / Self Care | Payer: Self-pay | Source: Ambulatory Visit | Attending: Internal Medicine | Admitting: Internal Medicine

## 2016-08-13 DIAGNOSIS — R1312 Dysphagia, oropharyngeal phase: Secondary | ICD-10-CM

## 2016-08-17 ENCOUNTER — Ambulatory Visit: Payer: Self-pay | Admitting: Pharmacist

## 2016-08-17 ENCOUNTER — Ambulatory Visit (INDEPENDENT_AMBULATORY_CARE_PROVIDER_SITE_OTHER): Payer: Self-pay | Admitting: Internal Medicine

## 2016-08-17 VITALS — BP 119/83 | HR 74 | Temp 98.0°F | Ht 62.0 in | Wt 160.9 lb

## 2016-08-17 DIAGNOSIS — R1310 Dysphagia, unspecified: Secondary | ICD-10-CM

## 2016-08-17 DIAGNOSIS — M25571 Pain in right ankle and joints of right foot: Secondary | ICD-10-CM

## 2016-08-17 NOTE — Progress Notes (Signed)
CC: here for follow up right ankle pain and dysphagia  HPI:  Kathleen [name] is a 55 y.o. woman with a past medical history listed below here today for follow up of her right ankle pain and dysphagia.  For details of today's visit and the status of her chronic medical issues please refer to the assessment and plan.   Past Medical History:  Diagnosis Date  . Asthma    2 sets of PFT's in 04/09 without sign variability. Last set with significant decrease in FEV1 with saline alone, suggesting Asthma but  recommended clinical corelation   . Asthma   . Bipolar 1 disorder [facility])    therapist is Caryl Asp and is followed by [employer]  . Blackout    negative work up including ESR, ANA, opthalmology referral, carotid dopplers, 2D echo, MRI and EEG.  . 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 Bronchitis   . Chronic headache   . Chronic pain    normal work up including TSH, RPR, B12, HIV, plain films, 2 ESR's, ANA, CK, RF along with routine CBC, CMET and UA. Further work up includes CRP, ESR, SPEP/UPEP, hepatitis erology, A1C , repeat ANA  . DUB (dysfunctional uterine bleeding)    and pelvic pain, with negative endometrial bx in 07/09 and transvaginal U/S significant for mild fibroids in 0/09.  Marland Kitchen GERD (gastroesophageal reflux disease)   . Lower extremity edema    Neg ABI's, normal 2D echo, normal albumin  . Menorrhagia   . Ovarian cyst   . Polysubstance abuse    none since March 17,2009.  Marland Kitchen Sleep apnea    NO CPAP    Review of Systems:   Review of Systems  Constitutional: Negative for chills and fever.  HENT:       Positive for dysphagia.  Respiratory: Negative for cough and shortness of breath.   Cardiovascular: Positive for leg swelling (right ankle). Negative for chest pain.  Musculoskeletal: Positive for joint pain.     Physical Exam:  Vitals:   08/17/16 1504  BP: 119/83  Pulse: 74  Temp: 98 F (36.7 C)  TempSrc:  Oral  SpO2: 100%  Weight: 160 lb 14.4 oz (73 kg)  Height: '5\' 2"'  (1.575 m)   Physical Exam  Constitutional:  Pleasant, AA woman, ambulating with limp, no distress, speaking with hoarse voice.  HENT:  Head: Normocephalic and atraumatic.  Eyes: EOM are normal.  Pulmonary/Chest: Effort normal.  Musculoskeletal:  Right ankle limited in dorsiflexion and plantarflexion. Tenderness to palpation of her posterior right ankle with mild swelling. No erythema or drainage.  Skin: Skin is warm and dry. No erythema.  Psychiatric: Mood and affect normal.    Assessment & Plan:   See Encounters Tab for problem based charting.  Patient discussed with Dr. Daryll Drown.  Right ankle pain A: Patient is here for follow up of her right ankle wound and pain that continues to persist.  She has not experienced symptoms of infection and the area of concern is without erythema.  There is mild swelling and limited ROM as well as tenderness on exam.  CT scan is scheduled for tomorrow.  I am not sure what is causing her prolonged pain from her injury but I encouraged her to follow up with CT scan as scheduled.  X-rays were negative.  P: - follow up CT scan results - work note given today  Odynophagia A: Patient is here  today for follow up of her dysphagia and odynophagia.  ENT referral has been placed but likely delayed to some extent because of her orange card.  She experienced some emesis speckled with small amount of blood last week but none since.  She had a MBS on 08/13/2016 with suspected primary esophageal dysphagia recommended GI evaluation.  Her voice is hoarse which comes and goes but also continues to experience dysphagia symptoms.  P: - referral for GI to consider EGD.

## 2016-08-17 NOTE — Patient Instructions (Addendum)
Thank you for coming to see me today. It was a pleasure. Today we talked about:   Please go to your CT scan appointment tomorrow for your foot pain.  Your doctor will follow up the results with you.  For your difficulty swallowing, I have placed a referral for you to be seen by gastroetnerology based on your swallowing study.  Please follow-up with [place] as needed in 1-2 weeks.  If you have any questions or concerns, please do not hesitate to call the office at [phone].  Take Care,   Jule Ser, DO

## 2016-08-17 NOTE — Assessment & Plan Note (Signed)
A: Patient is here for follow up of her right ankle wound and pain that continues to persist.  She has not experienced symptoms of infection and the area of concern is without erythema.  There is mild swelling and limited ROM as well as tenderness on exam.  CT scan is scheduled for tomorrow.  I am not sure what is causing her prolonged pain from her injury but I encouraged her to follow up with CT scan as scheduled.  X-rays were negative.  P: - follow up CT scan results - work note given today

## 2016-08-17 NOTE — Assessment & Plan Note (Signed)
A: Patient is here today for follow up of her dysphagia and odynophagia.  ENT referral has been placed but likely delayed to some extent because of her orange card.  She experienced some emesis speckled with small amount of blood last week but none since.  She had a MBS on 08/13/2016 with suspected primary esophageal dysphagia recommended GI evaluation.  Her voice is hoarse which comes and goes but also continues to experience dysphagia symptoms.  P: - referral for GI to consider EGD.

## 2016-08-18 ENCOUNTER — Ambulatory Visit (HOSPITAL_COMMUNITY)
Admission: RE | Admit: 2016-08-18 | Discharge: 2016-08-18 | Disposition: A | Discharge status: Home / Self Care | Payer: Self-pay | Source: Ambulatory Visit | Attending: Internal Medicine | Admitting: Internal Medicine

## 2016-08-18 ENCOUNTER — Encounter (HOSPITAL_COMMUNITY): Payer: Self-pay

## 2016-08-18 ENCOUNTER — Ambulatory Visit: Payer: Self-pay | Admitting: Pharmacist

## 2016-08-18 DIAGNOSIS — L03115 Cellulitis of right lower limb: Secondary | ICD-10-CM | POA: Insufficient documentation

## 2016-08-18 DIAGNOSIS — M25571 Pain in right ankle and joints of right foot: Secondary | ICD-10-CM | POA: Insufficient documentation

## 2016-08-18 MED ORDER — MOMETASONE FURO-FORMOTEROL FUM 200-5 MCG/ACT IN AERO: 1.0000 | INHALATION_SPRAY | Freq: Two times a day (BID) | RESPIRATORY_TRACT | 3 refills | Status: DC

## 2016-08-18 MED ORDER — IOPAMIDOL (ISOVUE-300) INJECTION 61%
INTRAVENOUS | Status: AC
  Administered 2016-08-18: 75 mL
  Filled 2016-08-18: qty 75

## 2016-08-18 NOTE — Progress Notes (Signed)
Medication Samples have been provided to the patient.  Drug: mometasone-formoterol [name]) Strength: 200/5 mcg Qty: 1 [id number] Exp.Date: 11/30/16 Dosing instructions: 1 puff BID  The patient has been instructed regarding the correct time, dose, and frequency of taking this medication, including desired effects and most common side effects.   Kim,Jennifer J 4:44 PM 08/18/2016

## 2016-08-19 NOTE — Progress Notes (Signed)
Internal Medicine Clinic Attending  Case discussed with Dr. Wallace at the time of the visit.  We reviewed the resident's history and exam and pertinent patient test results.  I agree with the assessment, diagnosis, and plan of care documented in the resident's note.  

## 2016-08-19 NOTE — Telephone Encounter (Signed)
I called the pt and informed her that her CT of the foot showed some mild cellulitis without drainable abscess.  Pt said that she was still having pain on the foot.  I advised her to make an appointment tomorrow to have her foot evaluated- and she may need abx vs referral to podiatry.

## 2016-08-20 ENCOUNTER — Ambulatory Visit (INDEPENDENT_AMBULATORY_CARE_PROVIDER_SITE_OTHER): Payer: Self-pay | Admitting: Internal Medicine

## 2016-08-20 VITALS — BP 106/72 | HR 77 | Temp 98.0°F | Ht 62.0 in | Wt 157.8 lb

## 2016-08-20 DIAGNOSIS — M7661 Achilles tendinitis, right leg: Secondary | ICD-10-CM

## 2016-08-20 MED ORDER — NAPROXEN 500 MG PO TABS: 500.0000 mg | ORAL_TABLET | Freq: Two times a day (BID) | ORAL | 0 refills | Status: DC

## 2016-08-20 NOTE — Assessment & Plan Note (Signed)
A: Patient is here for follow up of her right ankle pain that is continuing.  She has not had improvement with pain and discomfort since follow up on 08/17/2016.  Continues to have pain with ambulation, pain with plantar and dorsiflexion.  No fever or chills.  No warmth or erythema to suggest infection.  She continues to endorse swelling of her ankle.  CT completed yesterday showed "mild diffuse subcutaneous soft tissue swelling/edema suggesting cellulitis. No discrete drainable soft tissue abscess is identified. No CT findings for myofasciitis, pyomyositis, septic arthritis or osteomyelitis."  There is no indication presently for infection and patient recently completed course of doxycycline so I do not think further antibiotics are needed.  CT report also with "marked thickening of the Achilles tendon bold approximately 4 cm above the level of the calcaneus. This could be severe tendinopathy."  I think this is much more likely given clinical course and physical exam findings.  P: - symptoms less than three months so will treat as acute Achilles tendinopathy - not much relief with Mobic so will discontinue - start Naproxen 500mg  BID.  Given #30 tablets, no refills.  Continue PPI - recommended further rest, ice as tolerated. - referral placed to Sports Medicine for further treatment modality considerations such as ultrasound, bracing, exercises as she is unable to afford physical therapy with her [employer].

## 2016-08-20 NOTE — Progress Notes (Signed)
CC: here for follow up of right ankle pain  HPI:  Kathleen [name] is a 55 y.o. woman with a past medical history listed below here today for follow up of her right ankle pain.  For details of today's visit and the status of her chronic medical issues please refer to the assessment and plan.   Past Medical History:  Diagnosis Date  . Asthma    2 sets of PFT's in 04/09 without sign variability. Last set with significant decrease in FEV1 with saline alone, suggesting Asthma but  recommended clinical corelation   . Asthma   . Bipolar 1 disorder [facility])    therapist is Caryl Asp and is followed by [employer]  . Blackout    negative work up including ESR, ANA, opthalmology referral, carotid dopplers, 2D echo, MRI and EEG.  . 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 Bronchitis   . Chronic headache   . Chronic pain    normal work up including TSH, RPR, B12, HIV, plain films, 2 ESR's, ANA, CK, RF along with routine CBC, CMET and UA. Further work up includes CRP, ESR, SPEP/UPEP, hepatitis erology, A1C , repeat ANA  . DUB (dysfunctional uterine bleeding)    and pelvic pain, with negative endometrial bx in 07/09 and transvaginal U/S significant for mild fibroids in 0/09.  Marland Kitchen GERD (gastroesophageal reflux disease)   . Lower extremity edema    Neg ABI's, normal 2D echo, normal albumin  . Menorrhagia   . Ovarian cyst   . Polysubstance abuse    none since March 17,2009.  Marland Kitchen Sleep apnea    NO CPAP    Review of Systems:   Please see pertinent ROS reviewed in HPI and problem based charting.   Physical Exam:  Vitals:   08/20/16 1332 08/20/16 1334  BP: (!) 136/114 106/72  Pulse: 86 77  Temp: 98 F (36.7 C)   TempSrc: Oral   SpO2: 100%   Weight: 157 lb 12.8 oz (71.6 kg)   Height: '5\' 2"'  (1.575 m)    Physical Exam  Constitutional: She is oriented to person, place, and time and well-developed, well-nourished, and in no distress. No  distress.  HENT:  Head: Normocephalic and atraumatic.  Eyes: EOM are normal.  Musculoskeletal:  She is ambulating with limp favoring her right leg. Pain elicited with active and passive plantar and dorsiflexion. Tender to palpation along the Achilles tendon. Mild soft tissue swelling of right ankle.  Neurological: She is alert and oriented to person, place, and time.  Skin: Skin is warm and dry. No erythema.     Assessment & Plan:   See Encounters Tab for problem based charting.  Patient discussed with Dr. Daryll Drown   Achilles tendinitis, right leg A: Patient is here for follow up of her right ankle pain that is continuing.  She has not had improvement with pain and discomfort since follow up on 08/17/2016.  Continues to have pain with ambulation, pain with plantar and dorsiflexion.  No fever or chills.  No warmth or erythema to suggest infection.  She continues to endorse swelling of her ankle.  CT completed yesterday showed "mild diffuse subcutaneous soft tissue swelling/edema suggesting cellulitis. No discrete drainable soft tissue abscess is identified. No CT findings for myofasciitis, pyomyositis, septic arthritis or osteomyelitis."  There is no indication presently for infection and patient recently completed course of doxycycline so I do not think further antibiotics are  needed.  CT report also with "marked thickening of the Achilles tendon bold approximately 4 cm above the level of the calcaneus. This could be severe tendinopathy."  I think this is much more likely given clinical course and physical exam findings.  P: - symptoms less than three months so will treat as acute Achilles tendinopathy - not much relief with Mobic so will discontinue - start Naproxen 519m BID.  Given #30 tablets, no refills.  Continue PPI - recommended further rest, ice as tolerated. - referral placed to Sports Medicine for further treatment modality considerations such as ultrasound, bracing, exercises as  she is unable to afford physical therapy with her [employer]

## 2016-08-20 NOTE — Patient Instructions (Addendum)
STOP taking Meloxicam!  I have prescribed Naproxen 500mg  twice per day to attempt better pain control.  I have referred you to be seen by Sports Medicine.  Continue taking your Prilosec while taking Naproxen because it can cause some upset stomach.  Follow directions as below to help treat your pain:  Achilles tendinopathy usually gets better on its own, but it can take months to heal completely. To help your symptoms get better, you can: - Rest your Achilles tendon and avoid activities that cause pain - Ice the area - If your pain gets worse after activity, put a cold gel pack, bag of ice, or bag of frozen vegetables on the injured area every 1 to 2 hours (for up to 6 hours), for 15 minutes each time. Put a thin towel between the ice (or other cold object) and your skin. - Take medicine to reduce the pain - Your doctor might recommend that you take a medicine to relieve pain, such as acetaminophen (sample brand name: Tylenol), ibuprofen (sample brand names: Advil, Motrin), or naproxen (sample brand names: Aleve).

## 2016-08-23 NOTE — Progress Notes (Signed)
Internal Medicine Clinic Attending  Case discussed with Dr. Wallace at the time of the visit.  We reviewed the resident's history and exam and pertinent patient test results.  I agree with the assessment, diagnosis, and plan of care documented in the resident's note.  

## 2016-08-24 ENCOUNTER — Telehealth: Payer: Self-pay | Admitting: Licensed Clinical Social Worker

## 2016-08-26 ENCOUNTER — Ambulatory Visit: Payer: Self-pay

## 2016-08-26 NOTE — Telephone Encounter (Signed)
Kathleen [name] was referred to [facility] by pharmacy.  Pt requesting to speak with CSW regarding SS Disability application.  CSW placed call to Kathleen [name].  Pt inquired if CSW could assist with her disability application process or if [facility] could provide additional information to assist with her application.  Kathleen [name] has obtained legal representation.  CSW informed Kathleen [name], by policy [facility] does not provide written letters or complete additional forms for SS Disability process.  CSW discussed once the ROI is signed, SS Disability office request medical records which provides needed documentation.  Kathleen [name] informed of community program that offers assistance with Disability of application, confirmed agency still has this program.  CSW will inquire if agency would be able to assist even though pt has legal representation.  Pt is interested in the program if available.   Pt states she received an Rx from an ENT physician and is concerned regarding the cost of this medication.  Pt unsure of the name of the medication but from spelling, pt was prescribed Keflex 500 mg.  Pt informed if the prescription is for Keflex 500 mg, this medication is on the [employer] $4 list as well as the [place] $6 list.  Kathleen [name] thanked worked and denied add'l social work needs at this time.

## 2016-08-27 NOTE — Telephone Encounter (Signed)
CSW placed call to Ms. Purkey.  Message left requesting if pt would like referral to [facility] indicated if pt has established with atty, pt may want to remain under atty.  [place] can take case over; however, atty may still request fees if case won.  CSW requesting return call to notify if pt would like to move forward with referral.

## 2016-08-30 ENCOUNTER — Ambulatory Visit: Payer: Self-pay

## 2016-08-30 ENCOUNTER — Encounter (HOSPITAL_COMMUNITY): Payer: Self-pay | Admitting: Emergency Medicine

## 2016-08-30 ENCOUNTER — Emergency Department (HOSPITAL_BASED_OUTPATIENT_CLINIC_OR_DEPARTMENT_OTHER)
Admit: 2016-08-30 | Discharge: 2016-08-30 | Disposition: A | Discharge status: Home / Self Care | Payer: Self-pay | Attending: Emergency Medicine | Admitting: Emergency Medicine

## 2016-08-30 ENCOUNTER — Emergency Department (HOSPITAL_COMMUNITY)
Admission: EM | Admit: 2016-08-30 | Discharge: 2016-08-30 | Disposition: A | Discharge status: Home / Self Care | Payer: Self-pay | Attending: Emergency Medicine | Admitting: Emergency Medicine

## 2016-08-30 DIAGNOSIS — Z5321 Procedure and treatment not carried out due to patient leaving prior to being seen by health care provider: Secondary | ICD-10-CM | POA: Insufficient documentation

## 2016-08-30 DIAGNOSIS — Z87891 Personal history of nicotine dependence: Secondary | ICD-10-CM | POA: Insufficient documentation

## 2016-08-30 DIAGNOSIS — J45909 Unspecified asthma, uncomplicated: Secondary | ICD-10-CM | POA: Insufficient documentation

## 2016-08-30 DIAGNOSIS — M79609 Pain in unspecified limb: Secondary | ICD-10-CM

## 2016-08-30 MED ORDER — OXYCODONE-ACETAMINOPHEN 5-325 MG PO TABS
1.0000 | ORAL_TABLET | ORAL | Status: DC | PRN
  Administered 2016-08-30: 1 via ORAL
  Filled 2016-08-30: qty 1

## 2016-08-30 NOTE — ED Triage Notes (Signed)
Pt had leg injury in August and had stitches. Pt has had infection in the site. Pt noticed that she has been having knots on her leg where it is swollen and painful. Her MD advised her to come to ED.

## 2016-08-30 NOTE — Progress Notes (Signed)
*  Preliminary Results* Right lower extremity venous duplex completed. Right lower extremity is negative for deep vein thrombosis. There is no evidence of right Baker's cyst.  08/30/2016 6:52 PM  Maudry Mayhew, BS, RVT, RDCS, RDMS

## 2016-08-30 NOTE — ED Notes (Signed)
Called from Vascular and asked to place pt in bed with gown.

## 2016-08-30 NOTE — ED Notes (Signed)
Called more than3x no response.

## 2016-08-31 ENCOUNTER — Encounter: Payer: Self-pay | Admitting: Family Medicine

## 2016-08-31 ENCOUNTER — Ambulatory Visit (INDEPENDENT_AMBULATORY_CARE_PROVIDER_SITE_OTHER): Payer: Self-pay | Admitting: Family Medicine

## 2016-08-31 DIAGNOSIS — M7661 Achilles tendinitis, right leg: Secondary | ICD-10-CM

## 2016-08-31 DIAGNOSIS — S86011A Strain of right Achilles tendon, initial encounter: Secondary | ICD-10-CM

## 2016-08-31 NOTE — Progress Notes (Signed)
  Kathleen [name] - 55 y.o. female MRN [id number]  Date of birth: July 07, 1961  SUBJECTIVE:  Including CC & ROS.  Chief Complaint  Patient presents with  . Ankle Pain    Kathleen [name] is a 55 yo female that is presenting with achilles pain. She was cleaning out her refrigerator in July and a bottle fell out and broke. A piece of the glass lacerated her skin on her right achilles.  She was seen in the ED and was sutured. Since that time, she has had continued pain and hasn't been able to do the things she normally does. She has been walking with a limp. She has been treated for an infection over the area. She has been placed on Mobic and flexeril. Hasn't had any formal PT and no prior injury to the area.   ROS: No unexpected weight loss, fever, chills, numbness/tingling, redness, otherwise see HPI   HISTORY: Past Medical, Surgical, Social, and Family History Reviewed & Updated per EMR.   Pertinent Historical Findings include: PMSHx -  Asthma,  PSHx -  Former smoker.   DATA REVIEWED: 08/30/16: [place] LE: no DVT  08/18/13: CT right foot: Mild cellulitis but no definite CT findings for drainable soft tissue abscess, myofasciitis, pyomyositis, septic arthritis or Osteomyelitis. Focal area of Achilles tendinopathy or intrasubstance tear versus prior Achilles tendon rupture and repair.  PHYSICAL EXAM:  VS: BP:122/81  HR: bpm  TEMP: ( )  RESP:   HT:5\' 2"  (157.5 cm)   WT:148 lb (67.1 kg)  BMI:27.1 PHYSICAL EXAM: Gen: NAD, alert, cooperative with exam, well-appearing HEENT: clear conjunctiva, EOMI CV:  no edema, capillary refill brisk,  Resp: non-labored, normal speech Skin: no rashes, normal turgor  Neuro: no gross deficits.  Psych:  alert and oriented Right achilles:  TTP along the achilles roughly 5 cm proximal to insertion.  Lesion on the anterior aspect of the fibia that is TTP  Swelling noticed in the area 5 cm proximal to the achilles.  Limited ROM in plantar flexion  Normal inversion, eversion  and dorsiflexion  Weakness in plantar and dorsal flexion  Negative Thompson test  Ambulating with a limp  Unable to rise on her tip toes  Neurovascularly intact   Limited US: Right Achilles: normal insertion viewed and normal musculotendinous junction. Roughly 5 cm proximal to the insertion there is a hypoechoic region that represents roughly a 70% achilles tear.   ASSESSMENT & PLAN:   Partial Achilles tendon tear US revealing for a 70% achilles tear.  - she had a heel lift placed in her right shoe  - referral placed to Ortho for surgical considerations.  - f/u PRN

## 2016-08-31 NOTE — Patient Instructions (Signed)
[facility] Dr Sharol Given Monday 09/13/16 at [address], [place], [place] [address] Phone: [phone]

## 2016-08-31 NOTE — Telephone Encounter (Signed)
No response back from Ms. Burling at this time, but will remain available should pt request referral.

## 2016-09-02 NOTE — Assessment & Plan Note (Signed)
US revealing for a 70% achilles tear.  - she had a heel lift placed in her right shoe  - referral placed to Ortho for surgical considerations.  - f/u PRN

## 2016-09-13 ENCOUNTER — Telehealth: Payer: Self-pay | Admitting: *Deleted

## 2016-09-13 ENCOUNTER — Ambulatory Visit (INDEPENDENT_AMBULATORY_CARE_PROVIDER_SITE_OTHER): Payer: Self-pay | Admitting: Orthopedic Surgery

## 2016-09-13 NOTE — Telephone Encounter (Signed)
Letter left with Kathleen [name] for patient to pick up at her convenience

## 2016-09-30 ENCOUNTER — Ambulatory Visit (INDEPENDENT_AMBULATORY_CARE_PROVIDER_SITE_OTHER): Payer: Self-pay | Admitting: Orthopedic Surgery

## 2016-09-30 DIAGNOSIS — M7661 Achilles tendinitis, right leg: Secondary | ICD-10-CM

## 2016-10-01 ENCOUNTER — Telehealth: Payer: Self-pay | Admitting: Licensed Clinical Social Worker

## 2016-10-01 NOTE — Telephone Encounter (Signed)
CSW returned call to Ms. Doescher. Pt states she was in need of emergency assistance for her utility bill due to being off work.  Pt utilized social services in September and they were unable to assist.  Ms. Thore was able to have friend assist this month but inquired about other community resources.   CSW provided pt with information to [place] and [place].

## 2016-10-21 ENCOUNTER — Encounter: Payer: Self-pay | Admitting: *Deleted

## 2016-10-26 NOTE — Addendum Note (Signed)
Addended by: Hulan Fray on: 10/26/2016 04:38 PM   Modules accepted: Orders

## 2016-10-28 ENCOUNTER — Ambulatory Visit (INDEPENDENT_AMBULATORY_CARE_PROVIDER_SITE_OTHER): Payer: Self-pay | Admitting: Orthopedic Surgery

## 2016-11-01 ENCOUNTER — Ambulatory Visit: Payer: Self-pay | Admitting: Pharmacist

## 2016-11-01 ENCOUNTER — Ambulatory Visit (INDEPENDENT_AMBULATORY_CARE_PROVIDER_SITE_OTHER): Payer: Medicaid Other | Admitting: Orthopedic Surgery

## 2016-11-10 ENCOUNTER — Telehealth: Payer: Self-pay

## 2016-11-10 ENCOUNTER — Ambulatory Visit: Payer: Self-pay | Admitting: Internal Medicine

## 2016-11-10 NOTE — Telephone Encounter (Signed)
Medication Samples have been provided to the patient.  Drug: [name] Inhaler Strength: 151mcg/5mcg Qty: 2 [id number] Exp.Date: 12/23/2016 Dosing instructions: Inhale 2 puffs po BID   The patient has been instructed regarding the correct time, dose, and frequency of taking this medication, including desired effects and most common side effects.   Samples signed for by Dr. Flossie Dibble, Pharm.D.   Medication Samples have been provided to the patient.  Drug: Dulera Inhaler Strength: 200 mcg/74mcg  Qty: 1 [id number] Exp.Date: 11/30/2016 Dosing instructions: Inhale 1 puff into the lungs BID   The patient has been instructed regarding the correct time, dose, and frequency of taking this medication, including desired effects and most common side effects.   Samples signed for by Dr. Flossie Dibble, Pharm.D.   Darcella Cheshire 9:58 AM 11/10/2016    Darcella Cheshire 9:57 AM 11/10/2016

## 2016-11-15 ENCOUNTER — Ambulatory Visit (INDEPENDENT_AMBULATORY_CARE_PROVIDER_SITE_OTHER): Payer: Medicaid Other | Admitting: Orthopedic Surgery

## 2016-11-18 ENCOUNTER — Ambulatory Visit (INDEPENDENT_AMBULATORY_CARE_PROVIDER_SITE_OTHER): Payer: Self-pay | Admitting: Orthopedic Surgery

## 2016-11-18 ENCOUNTER — Encounter (INDEPENDENT_AMBULATORY_CARE_PROVIDER_SITE_OTHER): Payer: Self-pay | Admitting: Orthopedic Surgery

## 2016-11-18 DIAGNOSIS — S86011A Strain of right Achilles tendon, initial encounter: Secondary | ICD-10-CM

## 2016-11-18 MED ORDER — HYDROCODONE-ACETAMINOPHEN 5-325 MG PO TABS: 1.0000 | ORAL_TABLET | Freq: Three times a day (TID) | ORAL | 0 refills | Status: DC | PRN

## 2016-11-18 NOTE — Progress Notes (Signed)
Office Visit Note   Patient: Kathleen [name]           Date of Birth: 1961/08/08           MRN: [id number] Visit Date: 11/18/2016              Requested by: Holley Raring, MD [address] [place], [place] [address] PCP: Noah Delaine, MD   Assessment & Plan: Visit Diagnoses:  1. Partial tear of right Achilles tendon, initial encounter     Plan: Have provided an order for physical therapy. She will work on heel cord stretching which was demonstrated for her today. She will follow up in office in 4 weeks. Provided a one time prescription for Norco.   Follow-Up Instructions: Return in about 4 weeks (around 12/16/2016).   Orders:  Orders Placed This Encounter  Procedures  . Ambulatory referral to Physical Therapy   Meds ordered this encounter  Medications  . HYDROcodone-acetaminophen (NORCO/VICODIN) 5-325 MG tablet    Sig: Take 1 tablet by mouth 3 (three) times daily as needed for moderate pain.    Dispense:  30 tablet    Refill:  0      Procedures: No procedures performed   Clinical Data: No additional findings.   Subjective: Chief Complaint  Patient presents with  . Right Ankle - Follow-up    Cystic nodular inflammation of the achilles with an intact tendon status post traumatic laceration     Patient is a 55 year old woman seen for evaluation right achilles. She is status post a partial tear of the right Achilles on 08/31/16. At last visit received an injection 09/30/16. She did get some relief, she did have decrease in nodule and swelling. She complains of cramping, cold to touch, feels like something is sticking in my ankle. She is walking with a limp, she has persistent swelling in ankle. She is ready to go back to work but is unable to ambulate long distances, difficulty with shoe wear due to swelling.     Review of Systems  Constitutional: Negative for chills and fever.     Objective: Vital Signs: There were no vitals taken for this visit.  Physical Exam    Constitutional: She is oriented to person, place, and time. She appears well-developed and well-nourished.  Pulmonary/Chest: Effort normal.  Musculoskeletal:       Right ankle: Achilles tendon exhibits pain. Achilles tendon exhibits no defect and normal Thompson's test results.  Does have swelling, no defect.  Neurological: She is alert and oriented to person, place, and time.  Psychiatric: She has a normal mood and affect.  Nursing note reviewed. Antalgic gait.  Ortho Exam  Specialty Comments:  No specialty comments available.  Imaging: No results found.   PMFS History: Patient Active Problem List   Diagnosis Date Noted  . Odynophagia 08/10/2016  . Partial Achilles tendon tear 08/10/2016  . Lumbar radiculopathy 07/26/2016  . Wound infection 07/20/2016  . Tooth loose 03/24/2016  . Left hip pain 02/05/2016  . Depression 09/19/2015  . Asthmatic bronchitis 09/19/2015  . Hot flashes 02/05/2015  . Flashing lights seen 02/05/2015  . Abnormal uterine bleeding 07/24/2014  . Left knee pain 06/18/2014  . Healthcare maintenance 06/18/2014  . Menorrhagia 05/24/2014  . Dyspareunia 05/24/2014  . Breast lump in female and tenderness 05/24/2014  . Protein-calorie malnutrition, severe ([place]) 05/15/2014  . Asthma 05/13/2014  . Neck pain on left side 01/19/2012  . DVT 12/21/2010  . ABNORMAL VAGINAL BLEEDING 12/21/2010  .  BIPOLAR DISORDER UNSPECIFIED 01/21/2010  . GERD 01/21/2010  . CHEST PAIN, ATYPICAL 01/21/2010  . ARTHRITIS, GENERALIZED 10/08/2008  . SNORING 10/08/2008  . DISTURBANCE OF SKIN SENSATION 08/29/2008  . Chronic low back pain 08/08/2008  . INSOMNIA 05/31/2008  . VISION LOSS, SUDDEN 03/25/2008  . BREAST PAIN, BILATERAL 03/25/2008  . BREAST LUMP 03/25/2008  . LEG PAIN, BILATERAL 03/19/2008  . HALLUCINATIONS 03/18/2008  . GENERALIZED ANXIETY DISORDER 03/14/2008  . DRUG ABUSE 03/14/2008  . MIGRAINE VARIANT 03/14/2008   Past Medical History:  Diagnosis Date  .  Asthma    2 sets of PFT's in 04/09 without sign variability. Last set with significant decrease in FEV1 with saline alone, suggesting Asthma but  recommended clinical corelation   . Asthma   . Bipolar 1 disorder [facility])    therapist is Caryl Asp and is followed by [employer]  . Blackout    negative work up including ESR, ANA, opthalmology referral, carotid dopplers, 2D echo, MRI and EEG.  . 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 Bronchitis   . Chronic headache   . Chronic pain    normal work up including TSH, RPR, B12, HIV, plain films, 2 ESR's, ANA, CK, RF along with routine CBC, CMET and UA. Further work up includes CRP, ESR, SPEP/UPEP, hepatitis erology, A1C , repeat ANA  . DUB (dysfunctional uterine bleeding)    and pelvic pain, with negative endometrial bx in 07/09 and transvaginal U/S significant for mild fibroids in 0/09.  Marland Kitchen GERD (gastroesophageal reflux disease)   . Lower extremity edema    Neg ABI's, normal 2D echo, normal albumin  . Menorrhagia   . Ovarian cyst   . Polysubstance abuse    none since March 17,2009.  Marland Kitchen Sleep apnea    NO CPAP  . Tubular adenoma of colon     Family History  Problem Relation Age of Onset  . Colon cancer Mother 69  . Diabetes Father   . Hypertension Father   . Kidney disease Father   . Colon cancer Father 26  . Prostate cancer Father   . Kidney disease Sister     Past Surgical History:  Procedure Laterality Date  . CESAREAN SECTION    . CESAREAN SECTION    . HERNIA REPAIR    . Left partial oophorectomy    . OOPHORECTOMY     1/2 ovary removed  . VAGINA SURGERY     mesh   Social History   Occupational History  . Not on file.   Social History Main Topics  . Smoking status: Former Smoker    Packs/day: 0.25    Years: 30.00    Types: Cigarettes  . Smokeless tobacco: Never Used  . Alcohol use No  . Drug use: No     Comment: former  . Sexual activity: Not on file     Comment:  monogamous

## 2016-11-23 NOTE — Telephone Encounter (Signed)
Patient was contacted with Frank Tillman, PharmD candidate. I agree with the assessment and plan of care documented.  

## 2016-11-29 ENCOUNTER — Ambulatory Visit: Payer: Self-pay | Admitting: Physical Therapy

## 2016-12-08 ENCOUNTER — Ambulatory Visit: Payer: Self-pay | Attending: Family | Admitting: Physical Therapy

## 2016-12-16 ENCOUNTER — Ambulatory Visit (INDEPENDENT_AMBULATORY_CARE_PROVIDER_SITE_OTHER): Payer: Self-pay | Admitting: Orthopedic Surgery

## 2016-12-17 ENCOUNTER — Emergency Department (HOSPITAL_COMMUNITY)
Admission: EM | Admit: 2016-12-17 | Discharge: 2016-12-17 | Disposition: A | Discharge status: Home / Self Care | Payer: Self-pay | Attending: Emergency Medicine | Admitting: Emergency Medicine

## 2016-12-17 ENCOUNTER — Encounter (HOSPITAL_COMMUNITY): Payer: Self-pay

## 2016-12-17 ENCOUNTER — Emergency Department (HOSPITAL_COMMUNITY): Payer: Self-pay

## 2016-12-17 DIAGNOSIS — R0600 Dyspnea, unspecified: Secondary | ICD-10-CM | POA: Insufficient documentation

## 2016-12-17 DIAGNOSIS — R0602 Shortness of breath: Secondary | ICD-10-CM | POA: Insufficient documentation

## 2016-12-17 DIAGNOSIS — Z87891 Personal history of nicotine dependence: Secondary | ICD-10-CM | POA: Insufficient documentation

## 2016-12-17 DIAGNOSIS — J45909 Unspecified asthma, uncomplicated: Secondary | ICD-10-CM | POA: Insufficient documentation

## 2016-12-17 LAB — URINALYSIS, ROUTINE W REFLEX MICROSCOPIC
Bilirubin Urine: NEGATIVE
GLUCOSE, UA: NEGATIVE mg/dL
HGB URINE DIPSTICK: NEGATIVE
Ketones, ur: NEGATIVE mg/dL
Leukocytes, UA: NEGATIVE
Nitrite: NEGATIVE
PH: 7 (ref 5.0–8.0)
Protein, ur: NEGATIVE mg/dL
Specific Gravity, Urine: 1.006 (ref 1.005–1.030)

## 2016-12-17 LAB — BASIC METABOLIC PANEL
Anion gap: 8 (ref 5–15)
BUN: 13 mg/dL (ref 6–20)
CO2: 25 mmol/L (ref 22–32)
CREATININE: 0.83 mg/dL (ref 0.44–1.00)
Calcium: 9.1 mg/dL (ref 8.9–10.3)
Chloride: 107 mmol/L (ref 101–111)
GFR calc non Af Amer: >60 mL/min (ref >60)
Glucose, Bld: 89 mg/dL (ref 65–99)
POTASSIUM: 3.4 mmol/L — AB (ref 3.5–5.1)
SODIUM: 140 mmol/L (ref 135–145)

## 2016-12-17 LAB — CBC WITH DIFFERENTIAL/PLATELET
BASOS PCT: 0 %
Basophils Absolute: 0 10*3/uL (ref 0.0–0.1)
EOS ABS: 0.9 10*3/uL — AB (ref 0.0–0.7)
Eosinophils Relative: 9 %
HEMATOCRIT: 38 % (ref 36.0–46.0)
HEMOGLOBIN: 12.4 g/dL (ref 12.0–15.0)
LYMPHS ABS: 3.1 10*3/uL (ref 0.7–4.0)
Lymphocytes Relative: 34 %
MCH: 27.7 pg (ref 26.0–34.0)
MCHC: 32.6 g/dL (ref 30.0–36.0)
MCV: 84.8 fL (ref 78.0–100.0)
MONOS PCT: 8 %
Monocytes Absolute: 0.7 10*3/uL (ref 0.1–1.0)
NEUTROS ABS: 4.4 10*3/uL (ref 1.7–7.7)
NEUTROS PCT: 49 %
Platelets: 323 10*3/uL (ref 150–400)
RBC: 4.48 MIL/uL (ref 3.87–5.11)
RDW: 13.8 % (ref 11.5–15.5)
WBC: 9.1 10*3/uL (ref 4.0–10.5)

## 2016-12-17 MED ORDER — AZITHROMYCIN 250 MG PO TABS: 250.0000 mg | ORAL_TABLET | Freq: Every day | ORAL | 0 refills | Status: DC

## 2016-12-17 MED ORDER — ONDANSETRON HCL 4 MG/2ML IJ SOLN
4.0000 mg | Freq: Once | INTRAMUSCULAR | Status: AC
  Administered 2016-12-17: 4 mg via INTRAVENOUS
  Filled 2016-12-17: qty 2

## 2016-12-17 MED ORDER — ALBUTEROL (5 MG/ML) CONTINUOUS INHALATION SOLN
10.0000 mg/h | INHALATION_SOLUTION | Freq: Once | RESPIRATORY_TRACT | Status: AC
  Administered 2016-12-17: 10 mg/h via RESPIRATORY_TRACT
  Filled 2016-12-17: qty 20

## 2016-12-17 MED ORDER — SODIUM CHLORIDE 0.9 % IV BOLUS (SEPSIS)
2000.0000 mL | Freq: Once | INTRAVENOUS | Status: AC
  Administered 2016-12-17: 2000 mL via INTRAVENOUS

## 2016-12-17 MED ORDER — ALBUTEROL SULFATE 1.25 MG/3ML IN NEBU: 1.0000 | INHALATION_SOLUTION | Freq: Four times a day (QID) | RESPIRATORY_TRACT | 0 refills | Status: DC | PRN

## 2016-12-17 MED ORDER — PREDNISONE 50 MG PO TABS: ORAL_TABLET | ORAL | 0 refills | Status: DC

## 2016-12-17 MED ORDER — SODIUM CHLORIDE 0.9 % IV SOLN
INTRAVENOUS | Status: DC
  Administered 2016-12-17: 13:00:00 via INTRAVENOUS

## 2016-12-17 MED ORDER — METHYLPREDNISOLONE SODIUM SUCC 125 MG IJ SOLR
125.0000 mg | Freq: Once | INTRAMUSCULAR | Status: AC
  Administered 2016-12-17: 125 mg via INTRAVENOUS
  Filled 2016-12-17: qty 2

## 2016-12-17 MED ORDER — HYDROCODONE-ACETAMINOPHEN 7.5-325 MG/15ML PO SOLN: 15.0000 mL | Freq: Four times a day (QID) | ORAL | 0 refills | Status: DC | PRN

## 2016-12-17 MED ORDER — IPRATROPIUM BROMIDE 0.02 % IN SOLN
0.5000 mg | Freq: Once | RESPIRATORY_TRACT | Status: AC
  Administered 2016-12-17: 0.5 mg via RESPIRATORY_TRACT
  Filled 2016-12-17: qty 2.5

## 2016-12-17 NOTE — ED Provider Notes (Signed)
Kathleen [name]   CSN: [id number] Arrival date & time: 12/17/16  1241     History   Chief Complaint Chief Complaint  Patient presents with  . Shortness of Breath  . Emesis    HPI Kathleen [name] is a 56 y.o. female.  56 year old female with history of asthma presents with 3 days of productive cough and subjective fevers at home. She's had watery diarrhea as well as posttussive emesis. Denies any dysuria or hematuria. Mild headache without photophobia or neck pain. She denies any rashes or recent travel history. Endorses anorexia and states that she has not had anything to eat for the past 2 days and is also experiencing diffuse weakness. Has been using her albuterol inhaler as well as need without relief. Also been using over-the-counter medications without relief. Nothing makes her symptoms better.      Past Medical History:  Diagnosis Date  . Asthma    2 sets of PFT's in 04/09 without sign variability. Last set with significant decrease in FEV1 with saline alone, suggesting Asthma but  recommended clinical corelation   . Asthma   . Bipolar 1 disorder [facility])    therapist is Caryl Asp and is followed by [facility]  . Blackout    negative work up including ESR, ANA, opthalmology referral, carotid dopplers, 2D echo, MRI and EEG.  . 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 Bronchitis   . Chronic headache   . Chronic pain    normal work up including TSH, RPR, B12, HIV, plain films, 2 ESR's, ANA, CK, RF along with routine CBC, CMET and UA. Further work up includes CRP, ESR, SPEP/UPEP, hepatitis erology, A1C , repeat ANA  . DUB (dysfunctional uterine bleeding)    and pelvic pain, with negative endometrial bx in 07/09 and transvaginal U/S significant for mild fibroids in 0/09.  Marland Kitchen GERD (gastroesophageal reflux disease)   . Lower extremity edema    Neg ABI's, normal 2D echo, normal albumin  . Menorrhagia   . Ovarian  cyst   . Polysubstance abuse    none since March 17,2009.  Marland Kitchen Sleep apnea    NO CPAP  . Tubular adenoma of colon     Patient Active Problem List   Diagnosis Date Noted  . Odynophagia 08/10/2016  . Partial Achilles tendon tear 08/10/2016  . Lumbar radiculopathy 07/26/2016  . Wound infection 07/20/2016  . Tooth loose 03/24/2016  . Left hip pain 02/05/2016  . Depression 09/19/2015  . Asthmatic bronchitis 09/19/2015  . Hot flashes 02/05/2015  . Flashing lights seen 02/05/2015  . Abnormal uterine bleeding 07/24/2014  . Left knee pain 06/18/2014  . Healthcare maintenance 06/18/2014  . Menorrhagia 05/24/2014  . Dyspareunia 05/24/2014  . Breast lump in female and tenderness 05/24/2014  . Protein-calorie malnutrition, severe ([place]) 05/15/2014  . Asthma 05/13/2014  . Neck pain on left side 01/19/2012  . DVT 12/21/2010  . ABNORMAL VAGINAL BLEEDING 12/21/2010  . BIPOLAR DISORDER UNSPECIFIED 01/21/2010  . GERD 01/21/2010  . CHEST PAIN, ATYPICAL 01/21/2010  . ARTHRITIS, GENERALIZED 10/08/2008  . SNORING 10/08/2008  . DISTURBANCE OF SKIN SENSATION 08/29/2008  . Chronic low back pain 08/08/2008  . INSOMNIA 05/31/2008  . VISION LOSS, SUDDEN 03/25/2008  . BREAST PAIN, BILATERAL 03/25/2008  . BREAST LUMP 03/25/2008  . LEG PAIN, BILATERAL 03/19/2008  . HALLUCINATIONS 03/18/2008  . GENERALIZED ANXIETY DISORDER 03/14/2008  . DRUG ABUSE 03/14/2008  . MIGRAINE VARIANT  03/14/2008    Past Surgical History:  Procedure Laterality Date  . CESAREAN SECTION    . CESAREAN SECTION    . HERNIA REPAIR    . Left partial oophorectomy    . OOPHORECTOMY     1/2 ovary removed  . VAGINA SURGERY     mesh    OB History    Gravida Para Term Preterm AB Living   '3 2 2 ' 0 1     SAB TAB Ectopic Multiple Live Births   0 1 0           Home Medications    Prior to Admission medications   Medication Sig Start Date End Date Taking? Authorizing Provider  albuterol (ACCUNEB) 1.25 MG/3ML nebulizer  solution Inhale 1 ampule by nebulization every six (6) hours as needed for wheezing.    Historical Provider, MD  albuterol (PROVENTIL HFA;VENTOLIN HFA) 108 (90 Base) MCG/ACT inhaler Inhale 2 puffs into the lungs every 4 (four) hours as needed for wheezing or shortness of breath. 03/24/16   Jones Bales, MD  albuterol (PROVENTIL HFA;VENTOLIN HFA) 108 (90 Base) MCG/ACT inhaler Inhale into the lungs.    Historical Provider, MD  albuterol (PROVENTIL) (2.5 MG/3ML) 0.083% nebulizer solution Take 3 mLs (2.5 mg total) by nebulization every 6 (six) hours as needed for wheezing or shortness of breath. 12/17/15   Alexa Angela Burke, MD  EPINEPHrine 0.3 mg/0.3 mL IJ SOAJ injection Inject 0.3 mLs (0.3 mg total) into the muscle once. 09/17/15   Domenic Moras, PA-C  HYDROcodone-acetaminophen (NORCO/VICODIN) 5-325 MG tablet Take 1 tablet by mouth 3 (three) times daily as needed for moderate pain. 11/18/16   Suzan Slick, NP  lidocaine (LIDODERM) 5 % Place 1 patch onto the skin daily. Remove & Discard patch within 12 hours or as directed by MD 12/17/15   Florinda Marker, MD  mometasone-formoterol (DULERA) 200-5 MCG/ACT AERO Inhale 1 puff into the lungs 2 (two) times daily. 08/18/16   Holley Raring, MD  naproxen (NAPROSYN) 500 MG tablet Take 1 tablet (500 mg total) by mouth 2 (two) times daily with a meal. 08/20/16 08/20/17  Jule Ser, DO  omeprazole (PRILOSEC) 20 MG capsule Take 1 capsule (20 mg total) by mouth daily. 03/24/16 03/24/17  Jones Bales, MD  omeprazole (PRILOSEC) 20 MG capsule Take 20 mg by mouth.    Historical Provider, MD  OXYCODONE ER PO Take by mouth.    Historical Provider, MD  oxyCODONE-acetaminophen (PERCOCET/ROXICET) 5-325 MG tablet Take 1 tablet by mouth every 6 (six) hours as needed for severe pain. Patient not taking: Reported on 11/18/2016 07/20/16   Florinda Marker, MD    Family History Family History  Problem Relation Age of Onset  . Colon cancer Mother 85  . Diabetes Father   . Hypertension Father     . Kidney disease Father   . Colon cancer Father 79  . Prostate cancer Father   . Kidney disease Sister     Social History Social History  Substance Use Topics  . Smoking status: Former Smoker    Packs/day: 0.25    Years: 30.00    Types: Cigarettes  . Smokeless tobacco: Never Used  . Alcohol use No     Allergies   Topiramate and Tramadol   Review of Systems Review of Systems  All other systems reviewed and are negative.    Physical Exam Updated Vital Signs There were no vitals taken for this visit.  Physical Exam  Constitutional: She  is oriented to person, place, and time. She appears well-developed and well-nourished.  Non-toxic appearance. No distress.  HENT:  Head: Normocephalic and atraumatic.  Eyes: Conjunctivae, EOM and lids are normal. Pupils are equal, round, and reactive to light.  Neck: Normal range of motion. Neck supple. No tracheal deviation present. No thyroid mass present.  Cardiovascular: Normal rate, regular rhythm and normal heart sounds.  Exam reveals no gallop.   No murmur heard. Pulmonary/Chest: No stridor. Tachypnea noted. No respiratory distress. She has decreased breath sounds. She has wheezes. She has no rhonchi. She has no rales.  Abdominal: Soft. Normal appearance and bowel sounds are normal. She exhibits no distension. There is no tenderness. There is no rebound and no CVA tenderness.  Musculoskeletal: Normal range of motion. She exhibits no edema or tenderness.  Neurological: She is alert and oriented to person, place, and time. She has normal strength. No cranial nerve deficit or sensory deficit. GCS eye subscore is 4. GCS verbal subscore is 5. GCS motor subscore is 6.  Skin: Skin is warm and dry. No abrasion and no rash noted.  Psychiatric: She has a normal mood and affect. Her speech is normal and behavior is normal.  Nursing [name] and vitals reviewed.    ED Treatments / Results  Labs (all labs ordered are listed, but only abnormal  results are displayed) Labs Reviewed - No data to display  EKG  EKG Interpretation None       Radiology No results found.  Procedures Procedures (including critical care time)  Medications Ordered in ED Medications - No data to display   Initial Impression / Assessment and Plan / ED Course  I have reviewed the triage vital signs and the nursing notes.  Pertinent labs & imaging results that were available during my care of the patient were reviewed by me and considered in my medical decision making (see chart for details).  Clinical Course     Patient given albuterol and steroids here. Chest x-ray without infiltrates or edema. Wheezing improved after treatment here. Will place patient on steroids, albuterol, cough medication as well as anti-biotics.  Final Clinical Impressions(s) / ED Diagnoses   Final diagnoses:  None    New Prescriptions New Prescriptions   No medications on file     Lacretia Leigh, MD 12/17/16 [phone]

## 2016-12-17 NOTE — ED Triage Notes (Signed)
Pt with cough, congestion, fever, emesis and diarrhea since last night.

## 2017-01-10 ENCOUNTER — Telehealth: Payer: Self-pay | Admitting: Pharmacist

## 2017-01-10 DIAGNOSIS — J45901 Unspecified asthma with (acute) exacerbation: Secondary | ICD-10-CM

## 2017-01-11 ENCOUNTER — Encounter (HOSPITAL_COMMUNITY): Payer: Self-pay | Admitting: Emergency Medicine

## 2017-01-11 ENCOUNTER — Emergency Department (HOSPITAL_COMMUNITY)
Admission: EM | Admit: 2017-01-11 | Discharge: 2017-01-11 | Disposition: A | Discharge status: Home / Self Care | Payer: Self-pay | Attending: Emergency Medicine | Admitting: Emergency Medicine

## 2017-01-11 ENCOUNTER — Emergency Department (HOSPITAL_COMMUNITY): Payer: Self-pay

## 2017-01-11 DIAGNOSIS — J45909 Unspecified asthma, uncomplicated: Secondary | ICD-10-CM | POA: Insufficient documentation

## 2017-01-11 DIAGNOSIS — Z87891 Personal history of nicotine dependence: Secondary | ICD-10-CM | POA: Insufficient documentation

## 2017-01-11 DIAGNOSIS — Y939 Activity, unspecified: Secondary | ICD-10-CM | POA: Insufficient documentation

## 2017-01-11 DIAGNOSIS — Y929 Unspecified place or not applicable: Secondary | ICD-10-CM | POA: Insufficient documentation

## 2017-01-11 DIAGNOSIS — Z79899 Other long term (current) drug therapy: Secondary | ICD-10-CM | POA: Insufficient documentation

## 2017-01-11 DIAGNOSIS — M545 Low back pain, unspecified: Secondary | ICD-10-CM

## 2017-01-11 DIAGNOSIS — Y999 Unspecified external cause status: Secondary | ICD-10-CM | POA: Insufficient documentation

## 2017-01-11 DIAGNOSIS — Z7982 Long term (current) use of aspirin: Secondary | ICD-10-CM | POA: Insufficient documentation

## 2017-01-11 DIAGNOSIS — W108XXA Fall (on) (from) other stairs and steps, initial encounter: Secondary | ICD-10-CM | POA: Insufficient documentation

## 2017-01-11 MED ORDER — MOMETASONE FURO-FORMOTEROL FUM 200-5 MCG/ACT IN AERO: 1.0000 | INHALATION_SPRAY | Freq: Two times a day (BID) | RESPIRATORY_TRACT | 3 refills | Status: DC

## 2017-01-11 MED ORDER — NAPROXEN 500 MG PO TABS: 500.0000 mg | ORAL_TABLET | Freq: Two times a day (BID) | ORAL | 0 refills | Status: DC

## 2017-01-11 MED ORDER — METHOCARBAMOL 1000 MG/10ML IJ SOLN
1000.0000 mg | Freq: Once | INTRAVENOUS | Status: DC
  Filled 2017-01-11: qty 10

## 2017-01-11 MED ORDER — METHOCARBAMOL 500 MG PO TABS: 500.0000 mg | ORAL_TABLET | Freq: Two times a day (BID) | ORAL | 0 refills | Status: DC

## 2017-01-11 MED ORDER — METHOCARBAMOL 1000 MG/10ML IJ SOLN
1000.0000 mg | Freq: Once | INTRAMUSCULAR | Status: DC
  Filled 2017-01-11: qty 10

## 2017-01-11 MED ORDER — KETOROLAC TROMETHAMINE 60 MG/2ML IM SOLN
60.0000 mg | Freq: Once | INTRAMUSCULAR | Status: AC
  Administered 2017-01-11: 60 mg via INTRAMUSCULAR
  Filled 2017-01-11: qty 2

## 2017-01-11 MED ORDER — MOMETASONE FURO-FORMOTEROL FUM 200-5 MCG/ACT IN AERO: 1.0000 | INHALATION_SPRAY | Freq: Two times a day (BID) | RESPIRATORY_TRACT | 0 refills | Status: DC

## 2017-01-11 MED ORDER — METHOCARBAMOL 1000 MG/10ML IJ SOLN
1000.0000 mg | Freq: Once | INTRAMUSCULAR | Status: DC
  Administered 2017-01-11: 1000 mg via INTRAVENOUS

## 2017-01-11 NOTE — ED Notes (Signed)
Pt ambulated in hall with minimum assistance, pt sts persistent pain , lower back pain radiating to legs.

## 2017-01-11 NOTE — ED Provider Notes (Signed)
[place] DEPT Provider Note   CSN: [id number] Arrival date & time: 01/11/17  1135     History   Chief Complaint Chief Complaint  Patient presents with  . Back Pain    HPI Kathleen [name] is a 56 y.o. female.  HPI Patient presents with sudden onset, constant, sharp, severe lower back pain that occasionally radiates down bilateral lower extremities that began yesterday after she lifted a case of water.  Worse with movement.  She also states she fell down some stairs a couple of days ago, landing on her coccyx.  No head injury or LOC.  She took oxycodone and used a lidocaine patch with minimal relief. No numbness or weakness.  She reports difficulty ambulating due to pain.  No b/b incontinence, saddle anesthesia, urinary symptoms, abdominal pani, fever, chills, or IVDU.    Past Medical History:  Diagnosis Date  . Asthma    2 sets of PFT's in 04/09 without sign variability. Last set with significant decrease in FEV1 with saline alone, suggesting Asthma but  recommended clinical corelation   . Asthma   . Bipolar 1 disorder [facility])    therapist is Caryl Asp and is followed by [facility] [facility] [employer]  . Blackout    negative work up including ESR, ANA, opthalmology referral, carotid dopplers, 2D echo, MRI and EEG.  . 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 Bronchitis   . Chronic headache   . Chronic pain    normal work up including TSH, RPR, B12, HIV, plain films, 2 ESR's, ANA, CK, RF along with routine CBC, CMET and UA. Further work up includes CRP, ESR, SPEP/UPEP, hepatitis erology, A1C , repeat ANA  . DUB (dysfunctional uterine bleeding)    and pelvic pain, with negative endometrial bx in 07/09 and transvaginal U/S significant for mild fibroids in 0/09.  Marland Kitchen GERD (gastroesophageal reflux disease)   . Lower extremity edema    Neg ABI's, normal 2D echo, normal albumin  . Menorrhagia   . Ovarian cyst   . Polysubstance abuse    none since March  17,2009.  Marland Kitchen Sleep apnea    NO CPAP  . Tubular adenoma of colon     Patient Active Problem List   Diagnosis Date Noted  . Odynophagia 08/10/2016  . Partial Achilles tendon tear 08/10/2016  . Lumbar radiculopathy 07/26/2016  . Wound infection 07/20/2016  . Tooth loose 03/24/2016  . Left hip pain 02/05/2016  . Depression 09/19/2015  . Asthmatic bronchitis 09/19/2015  . Hot flashes 02/05/2015  . Flashing lights seen 02/05/2015  . Abnormal uterine bleeding 07/24/2014  . Left knee pain 06/18/2014  . Healthcare maintenance 06/18/2014  . Menorrhagia 05/24/2014  . Dyspareunia 05/24/2014  . Breast lump in female and tenderness 05/24/2014  . Protein-calorie malnutrition, severe ([place]) 05/15/2014  . Asthma 05/13/2014  . Neck pain on left side 01/19/2012  . DVT 12/21/2010  . ABNORMAL VAGINAL BLEEDING 12/21/2010  . BIPOLAR DISORDER UNSPECIFIED 01/21/2010  . GERD 01/21/2010  . CHEST PAIN, ATYPICAL 01/21/2010  . ARTHRITIS, GENERALIZED 10/08/2008  . SNORING 10/08/2008  . DISTURBANCE OF SKIN SENSATION 08/29/2008  . Chronic low back pain 08/08/2008  . INSOMNIA 05/31/2008  . VISION LOSS, SUDDEN 03/25/2008  . BREAST PAIN, BILATERAL 03/25/2008  . BREAST LUMP 03/25/2008  . LEG PAIN, BILATERAL 03/19/2008  . HALLUCINATIONS 03/18/2008  . GENERALIZED ANXIETY DISORDER 03/14/2008  . DRUG ABUSE 03/14/2008  . MIGRAINE VARIANT 03/14/2008    Past  Surgical History:  Procedure Laterality Date  . CESAREAN SECTION    . CESAREAN SECTION    . HERNIA REPAIR    . Left partial oophorectomy    . OOPHORECTOMY     1/2 ovary removed  . VAGINA SURGERY     mesh    OB History    Gravida Para Term Preterm AB Living   _0 0 1     SAB TAB Ectopic Multiple Live Births   0 1 0           Home Medications    Prior to Admission medications   Medication Sig Start Date End Date Taking? Authorizing Provider  albuterol (ACCUNEB) 1.25 MG/3ML nebulizer solution Take 3 mLs (1.25 mg total) by nebulization  every 6 (six) hours as needed for wheezing. 12/17/16   Lacretia Leigh, MD  albuterol (PROVENTIL HFA;VENTOLIN HFA) 108 (90 Base) MCG/ACT inhaler Inhale 2 puffs into the lungs every 4 (four) hours as needed for wheezing or shortness of breath. 03/24/16   Jones Bales, MD  albuterol (PROVENTIL) (2.5 MG/3ML) 0.083% nebulizer solution Take 3 mLs (2.5 mg total) by nebulization every 6 (six) hours as needed for wheezing or shortness of breath. 12/17/15   Florinda Marker, MD  aspirin EC 81 MG tablet Take 81 mg by mouth daily.    Historical Provider, MD  azithromycin (ZITHROMAX) 250 MG tablet Take 1 tablet (250 mg total) by mouth daily. Take first 2 tablets together, then 1 every day until finished. 12/17/16   Lacretia Leigh, MD  cyclobenzaprine (FLEXERIL) 10 MG tablet Take 10 mg by mouth 3 (three) times daily as needed for muscle spasms.    Historical Provider, MD  EPINEPHrine 0.3 mg/0.3 mL IJ SOAJ injection Inject 0.3 mLs (0.3 mg total) into the muscle once. Patient not taking: Reported on 12/17/2016 09/17/15   Domenic Moras, PA-C  HYDROcodone-acetaminophen (HYCET) 7.5-325 mg/15 ml solution Take 15 mLs by mouth every 6 (six) hours as needed for moderate pain (or cough). 12/17/16   Lacretia Leigh, MD  HYDROcodone-acetaminophen (NORCO/VICODIN) 5-325 MG tablet Take 1 tablet by mouth 3 (three) times daily as needed for moderate pain. 11/18/16   Suzan Slick, NP  lidocaine (LIDODERM) 5 % Place 1 patch onto the skin daily. Remove & Discard patch within 12 hours or as directed by MD Patient taking differently: Place 1 patch onto the skin daily as needed (pain). Remove & Discard patch within 12 hours or as directed by MD 12/17/15   Florinda Marker, MD  methocarbamol (ROBAXIN) 500 MG tablet Take 1 tablet (500 mg total) by mouth 2 (two) times daily. 01/11/17   Lurleen Soltero, PA-C  mometasone-formoterol (DULERA) 200-5 MCG/ACT AERO Inhale 1 puff into the lungs 2 (two) times daily. 08/18/16   Holley Raring, MD  naproxen (NAPROSYN) 500 MG tablet  Take 1 tablet (500 mg total) by mouth 2 (two) times daily. 01/11/17   Gloriann Loan, PA-C  omeprazole (PRILOSEC) 20 MG capsule Take 1 capsule (20 mg total) by mouth daily. 03/24/16 03/24/17  Jones Bales, MD  oxyCODONE-acetaminophen (PERCOCET/ROXICET) 5-325 MG tablet Take 1 tablet by mouth every 6 (six) hours as needed for severe pain. Patient not taking: Reported on 11/18/2016 07/20/16   Florinda Marker, MD  predniSONE (DELTASONE) 50 MG tablet One po qd x 5 days 12/17/16   Lacretia Leigh, MD    Family History Family History  Problem Relation Age of Onset  . Colon cancer Mother 79  . Diabetes  Father   . Hypertension Father   . Kidney disease Father   . Colon cancer Father 29  . Prostate cancer Father   . Kidney disease Sister     Social History Social History  Substance Use Topics  . Smoking status: Former Smoker    Packs/day: 0.25    Years: 30.00    Types: Cigarettes  . Smokeless tobacco: Never Used  . Alcohol use No     Allergies   Topiramate and Tramadol   Review of Systems Review of Systems All other systems negative unless otherwise stated in HPI   Physical Exam Updated Vital Signs BP 101/62   Pulse 79   Temp 97.5 F (36.4 C) (Oral)   Resp 16   Ht _0  (1.575 m)   SpO2 98%   Physical Exam  Constitutional: She is oriented to person, place, and time. She appears well-developed and well-nourished.  HENT:  Head: Atraumatic.  Eyes: Conjunctivae are normal.  Cardiovascular: Normal rate, regular rhythm, normal heart sounds and intact distal pulses.   Pulses:      Dorsalis pedis pulses are 2+ on the right side, and 2+ on the left side.  Pulmonary/Chest: Effort normal and breath sounds normal.  Abdominal: Soft. Bowel sounds are normal. She exhibits no distension. There is no tenderness.  Musculoskeletal: She exhibits tenderness.  Diffuse lumbar spine and musculature tenderness. No step offs. No crepitus. Exquisite pain with any ROM of lumbar spine.   Neurological: She  is alert and oriented to person, place, and time.  No saddle anesthesia. Strength and sensation intact bilaterally throughout lower extremities.  Skin: Skin is warm and dry.  Psychiatric: She has a normal mood and affect. Her behavior is normal.     ED Treatments / Results  Labs (all labs ordered are listed, but only abnormal results are displayed) Labs Reviewed - No data to display  EKG  EKG Interpretation None       Radiology Dg Lumbar Spine Complete  Result Date: 01/11/2017 CLINICAL DATA:  Status post fall.  Back pain. EXAM: LUMBAR SPINE - COMPLETE 4+ VIEW COMPARISON:  None. FINDINGS: There is no evidence of lumbar spine fracture. Alignment is normal. Mild degenerative disc disease with disc height loss at L5-S1. IMPRESSION: No acute osseous injury of the lumbar spine. Electronically Signed   By: Kathreen Devoid   On: 01/11/2017 13:03   Dg Sacrum/coccyx  Result Date: 01/11/2017 CLINICAL DATA:  Fall EXAM: SACRUM AND COCCYX - 2+ VIEW COMPARISON:  CT scan 12/20/2010 sagittal view FINDINGS: Three views of sacrum and coccyx submitted. No acute fracture or subluxation. Bilateral SI joints are symmetrical in appearance. Pelvic phleboliths are noted. IMPRESSION: Negative. Electronically Signed   By: Lahoma Crocker M.D.   On: 01/11/2017 13:05    Procedures Procedures (including critical care time)  Medications Ordered in ED Medications  methocarbamol (ROBAXIN) 1,000 mg in dextrose 5 % 50 mL IVPB ( Intravenous Canceled Entry 01/11/17 1357)  ketorolac (TORADOL) injection 60 mg (60 mg Intramuscular Given 01/11/17 1213)     Initial Impression / Assessment and Plan / ED Course  I have reviewed the triage vital signs and the nursing notes.  Pertinent labs & imaging results that were available during my care of the patient were reviewed by me and considered in my medical decision making (see chart for details).     Patient with back pain.  No neurological deficits and normal neuro exam.   Patient is ambulatory.  No loss of bowel  or bladder control.  No concern for cauda equina.  No fever, night sweats, weight loss, h/o cancer, IVDA, no recent procedure to back. No urinary symptoms suggestive of UTI.  She received IM Toradol and Robaxin in ED.  She had a vagal syncopal episode when receiving IM Robaxin and returned to her baseline.  She is able to ambulate in ED without difficulty.  Last narcotic prescription 11/18/16 from orthopedics.  In ED, she is continually asking for "stronger" medication.  Discussed that narcotics are not indicated at this time and she will need to follow up with her PCP in 1 week for persistent symptoms.  Appears safe for discharge at this time.  Return precautions discussed.    Final Clinical Impressions(s) / ED Diagnoses   Final diagnoses:  Acute bilateral low back pain without sciatica    New Prescriptions New Prescriptions   METHOCARBAMOL (ROBAXIN) 500 MG TABLET    Take 1 tablet (500 mg total) by mouth 2 (two) times daily.   NAPROXEN (NAPROSYN) 500 MG TABLET    Take 1 tablet (500 mg total) by mouth 2 (two) times daily.     Gloriann Loan, PA-C 01/11/17 [place], MD 01/12/17 2128

## 2017-01-11 NOTE — Discharge Instructions (Signed)
Please take Robaxin and Naproxen twice daily for pain.  You have likely strained your back.  This will take time to heal.  Please follow up with your primary care physician in 1 week if your symptoms persist.  Return to the ED for any new or concerning symptoms.

## 2017-01-11 NOTE — Progress Notes (Signed)
Patient requested help with Kathleen [name] access. Sent prescription with free trial card and referred patient to [facility] pharmacy. Patient verbalized understanding

## 2017-01-11 NOTE — ED Triage Notes (Signed)
Patient states that she lifted a case of water yesterday and having lower back pain ever since.

## 2017-01-13 ENCOUNTER — Ambulatory Visit: Payer: Self-pay | Admitting: Internal Medicine

## 2017-02-08 ENCOUNTER — Telehealth: Payer: Self-pay | Admitting: Internal Medicine

## 2017-02-08 NOTE — Telephone Encounter (Signed)
TALKED WITH PT NEEDS TO RENEW GCCN CARD, SHE WILL CALL BACK AND MAKE AN APT. TO RENEW NEXT WEEK AFTER SHE FEELS BETTER.

## 2017-02-09 NOTE — Addendum Note (Signed)
Addended by: Hulan Fray on: 02/09/2017 07:28 PM   Modules accepted: Orders

## 2017-03-14 ENCOUNTER — Encounter (INDEPENDENT_AMBULATORY_CARE_PROVIDER_SITE_OTHER): Payer: Self-pay

## 2017-03-14 ENCOUNTER — Ambulatory Visit (INDEPENDENT_AMBULATORY_CARE_PROVIDER_SITE_OTHER): Payer: Self-pay | Admitting: Internal Medicine

## 2017-03-14 ENCOUNTER — Telehealth: Payer: Self-pay | Admitting: *Deleted

## 2017-03-14 VITALS — BP 130/92 | HR 72 | Temp 97.9°F | Wt 156.3 lb

## 2017-03-14 DIAGNOSIS — Z7951 Long term (current) use of inhaled steroids: Secondary | ICD-10-CM

## 2017-03-14 DIAGNOSIS — J4530 Mild persistent asthma, uncomplicated: Secondary | ICD-10-CM

## 2017-03-14 DIAGNOSIS — M5416 Radiculopathy, lumbar region: Secondary | ICD-10-CM

## 2017-03-14 DIAGNOSIS — J4531 Mild persistent asthma with (acute) exacerbation: Secondary | ICD-10-CM

## 2017-03-14 DIAGNOSIS — Z87891 Personal history of nicotine dependence: Secondary | ICD-10-CM

## 2017-03-14 DIAGNOSIS — Z9181 History of falling: Secondary | ICD-10-CM

## 2017-03-14 MED ORDER — ALBUTEROL SULFATE HFA 108 (90 BASE) MCG/ACT IN AERS: 2.0000 | INHALATION_SPRAY | RESPIRATORY_TRACT | 6 refills | Status: DC | PRN

## 2017-03-14 MED ORDER — LORATADINE 10 MG PO TABS: 10.0000 mg | ORAL_TABLET | Freq: Every day | ORAL | 2 refills | Status: DC

## 2017-03-14 MED ORDER — MOMETASONE FURO-FORMOTEROL FUM 200-5 MCG/ACT IN AERO: 1.0000 | INHALATION_SPRAY | Freq: Two times a day (BID) | RESPIRATORY_TRACT | 3 refills | Status: DC

## 2017-03-14 NOTE — Assessment & Plan Note (Signed)
Patient complains of lumbosacral back pain which she reports began after a fall downstairs 6 weeks ago. She was reportedly seen by orthopedics who told her she had a fracture of her tailbone. Earlier this year on 01/11/2017 patient also presented to the emergency department after complaint of fall and pain in her tailbone where she had negative x-ray imaging for any fractures. She endorses pain with ambulation, standing for long periods, sitting for prolonged periods, inability to lie flat, pain with sleeping. Reviewed records from [place] and do not find any mention of fracture. Patient was referred for x-ray and MRI imaging at that time which she did not complete. Patient was not prescribed any medications after that visit. Last pain medication today asking for morphine injection and oxycodone. She reports that oxycodone in combination with methocarbamol is the only thing that relieves her pain. She reports she's tried naproxen without success.  On further review of the chart patient has been seen multiple times in the emergency room with complaints of acute fall and requests for narcotic medications. Review of the [place], controlled substance database shows that she has 3 narcotic medication fills in the last 6 months most recently on 01/11/17 where she had Percocet 5-325mg  #120 filled which was written by an emergency department provider.  On exam patient is exquisitely tender to light touch over the lumbosacral region she has exaggerated complaint of pain with all movements during exam which are inconsistent with her gait at the beginning and end of our visit. She also appears to be in no acute distress when she is sitting during her interview. Not able to appreciate any crepitus in her hip or knee, no signs of swelling of the large joints, and no deformities or step-offs of her lumbar sacral spine. My exam is limited by cooperation as the patient has significant pain complaints.  Feel that the  patient's exam is consistent only with mild osteoarthritis that may have radicular symptoms. I do not appreciate any indication for narcotic pain medications at this time. In addition there are some red flags on chart review that are concerning for repeated requests for narcotic pain meds. I have discussed the safety profile these medications and comparison other medications which should have similar efficacy. I have also recommended topical therapies such as heating pad, icy hot, and recommended alternating therapy with NSAIDs and acetaminophen.  Assessment: Lumbar radiculopathy, requests for narcotic pain medications, red flags  Pain: Recommend topical therapies as above, alternation of NSAID and APAP Would not recommend narcotic medications Have offered physical therapy, the patient will need to recertify her [place] prior to this referral

## 2017-03-14 NOTE — Assessment & Plan Note (Signed)
Patient presents with complaint of worsening cough over the last 4 weeks. She notes that she has had several wheezing episodes during this time. She ran out of her [name] roughly 1 week ago and reports she's been taking her albuterol nebs 3 times daily due to the symptoms until she ran out several days ago. Patient reports that she has had some sinus congestion and worsening of allergy symptoms with rhinorrhea with changing weather recently. Not currently taking any medications for allergies. Patient denies any fevers or chills, no sore throat, occasional wheezing, no dyspnea on exertion.  Assessment: Mild persistent asthma with recent medication noncompliance due to lack of refills  Plan: We'll refill Dulera today, encourage patient to see Clarice Pole for recertification for large part in order to have this medication filled as soon as possible Sample of albuterol from the clinic today. Recommend Claritin daily for control of allergy symptoms I also recommended Flonase, patient reports she isn't able to take intranasal medications can make her sick

## 2017-03-14 NOTE — Progress Notes (Signed)
CC: Asthma and back pain HPI: Kathleen [name] is a 56 y.o. female with a h/o of asthma who presents for evaluation of cough and back pain.  Please see Problem-based charting for HPI and the status of patient's chronic medical conditions.  Past Medical History:  Diagnosis Date  . Asthma    2 sets of PFT's in 04/09 without sign variability. Last set with significant decrease in FEV1 with saline alone, suggesting Asthma but  recommended clinical corelation   . Asthma   . Bipolar 1 disorder [facility])    therapist is Caryl Asp and is followed by [facility]  . Blackout    negative work up including ESR, ANA, opthalmology referral, carotid dopplers, 2D echo, MRI and EEG.  . 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 Bronchitis   . Chronic headache   . Chronic pain    normal work up including TSH, RPR, B12, HIV, plain films, 2 ESR's, ANA, CK, RF along with routine CBC, CMET and UA. Further work up includes CRP, ESR, SPEP/UPEP, hepatitis erology, A1C , repeat ANA  . DUB (dysfunctional uterine bleeding)    and pelvic pain, with negative endometrial bx in 07/09 and transvaginal U/S significant for mild fibroids in 0/09.  Marland Kitchen GERD (gastroesophageal reflux disease)   . Lower extremity edema    Neg ABI's, normal 2D echo, normal albumin  . Menorrhagia   . Ovarian cyst   . Polysubstance abuse    none since March 17,2009.  Marland Kitchen Sleep apnea    NO CPAP  . Tubular adenoma of colon    Social History  Substance Use Topics  . Smoking status: Former Smoker    Packs/day: 0.25    Years: 30.00    Types: Cigarettes  . Smokeless tobacco: Never Used  . Alcohol use No   Family History  Problem Relation Age of Onset  . Colon cancer Mother 50  . Diabetes Father   . Hypertension Father   . Kidney disease Father   . Colon cancer Father 27  . Prostate cancer Father   . Kidney disease Sister    Review of Systems: ROS in HPI. Otherwise: Review of Systems    Constitutional: Negative for chills, fever and weight loss.  Respiratory: Positive for cough and wheezing. Negative for shortness of breath.   Cardiovascular: Negative for chest pain and leg swelling.  Gastrointestinal: Negative for abdominal pain, constipation, diarrhea, nausea and vomiting.  Genitourinary: Negative for dysuria, frequency and urgency.  Musculoskeletal: Positive for back pain.   Physical Exam: Vitals:   03/14/17 1008  BP: (!) 130/92  Pulse: 72  Temp: 97.9 F (36.6 C)  TempSrc: Oral  Weight: 156 lb 4.8 oz (70.9 kg)   Physical Exam  Constitutional: She appears well-developed. She is cooperative. No distress.  Cardiovascular: Normal rate, regular rhythm, normal heart sounds and normal pulses.  Exam reveals no gallop.   No murmur heard. Pulmonary/Chest: Effort normal. No respiratory distress. She has wheezes (throughout). She has no rhonchi. She has no rales. Breasts are symmetrical.  Abdominal: Soft. Bowel sounds are normal. There is no tenderness.  Musculoskeletal: She exhibits no edema or deformity.  Pt reports extreme TTP to light touch over lumbar/sacral spine and paraspinal regions. She is unable to comply with straight leg raise testing d/t severe pain. However, pt does not have concurrent increase in HR and has inconsistently normal gait to the severity of her pain complaint.  Assessment & Plan:  See encounters tab for problem based medical decision making. Patient discussed with Dr. Angelia Mould  Lumbar radiculopathy Patient complains of lumbosacral back pain which she reports began after a fall downstairs 6 weeks ago. She was reportedly seen by orthopedics who told her she had a fracture of her tailbone. Earlier this year on 01/11/2017 patient also presented to the emergency department after complaint of fall and pain in her tailbone where she had negative x-ray imaging for any fractures. She endorses pain with ambulation, standing for long periods, sitting  for prolonged periods, inability to lie flat, pain with sleeping. Reviewed records from [place] and do not find any mention of fracture. Patient was referred for x-ray and MRI imaging at that time which she did not complete. Patient was not prescribed any medications after that visit. Last pain medication today asking for morphine injection and oxycodone. She reports that oxycodone in combination with methocarbamol is the only thing that relieves her pain. She reports she's tried naproxen without success.  On further review of the chart patient has been seen multiple times in the emergency room with complaints of acute fall and requests for narcotic medications. Review of the [place], controlled substance database shows that she has 3 narcotic medication fills in the last 6 months most recently on 01/11/17 where she had Percocet 5-354m #120 filled which was written by an emergency department provider.  On exam patient is exquisitely tender to light touch over the lumbosacral region she has exaggerated complaint of pain with all movements during exam which are inconsistent with her gait at the beginning and end of our visit. She also appears to be in no acute distress when she is sitting during her interview. Not able to appreciate any crepitus in her hip or knee, no signs of swelling of the large joints, and no deformities or step-offs of her lumbar sacral spine. My exam is limited by cooperation as the patient has significant pain complaints.  Feel that the patient's exam is consistent only with mild osteoarthritis that may have radicular symptoms. I do not appreciate any indication for narcotic pain medications at this time. In addition there are some red flags on chart review that are concerning for repeated requests for narcotic pain meds. I have discussed the safety profile these medications and comparison other medications which should have similar efficacy. I have also recommended topical therapies  such as heating pad, icy hot, and recommended alternating therapy with NSAIDs and acetaminophen.  Assessment: Lumbar radiculopathy, requests for narcotic pain medications, red flags  Pain: Recommend topical therapies as above, alternation of NSAID and APAP Would not recommend narcotic medications Have offered physical therapy, the patient will need to recertify her OUnadillaprior to this referral  Asthma Patient presents with complaint of worsening cough over the last 4 weeks. She notes that she has had several wheezing episodes during this time. She ran out of her DRuthe Mannanroughly 1 week ago and reports she's been taking her albuterol nebs 3 times daily due to the symptoms until she ran out several days ago. Patient reports that she has had some sinus congestion and worsening of allergy symptoms with rhinorrhea with changing weather recently. Not currently taking any medications for allergies. Patient denies any fevers or chills, no sore throat, occasional wheezing, no dyspnea on exertion.  Assessment: Mild persistent asthma with recent medication noncompliance due to lack of refills  Plan: We'll refill Dulera today, encourage patient to see DClarice Polefor  recertification for large part in order to have this medication filled as soon as possible Sample of albuterol from the clinic today. Recommend Claritin daily for control of allergy symptoms I also recommended Flonase, patient reports she isn't able to take intranasal medications can make her sick   Signed: Holley Raring, MD 03/14/2017, 11:15 AM  Pager: [phone]

## 2017-03-14 NOTE — Patient Instructions (Addendum)
I would like to restart your medications for asthma. You would also benefit from starting a medication for allergies like Claritin or Zyrtec which you can get over-the-counter. If you would like to try a nasal spray again, I would recommend Flonase for this.  I have provided a sample of albuterol which you should take as needed for exacerbations and wheezing. I also resent prescription for [facility]. You will need to see Clarice Pole when you leave today in order to have your [facility] card recertified.  For your back pain, I recommend taking a combination of acetaminophen (Tylenol) and NSAID such as the naproxyn or ibuprofen. You can alternate these medications every 4 hours. You will also likely benefit from heating pads and topical therapies such as Icy Hot and Blue Emu gels.

## 2017-03-18 NOTE — Progress Notes (Signed)
Internal Medicine Clinic Attending  Case discussed with Dr. Strelow at the time of the visit.  We reviewed the resident's history and exam and pertinent patient test results.  I agree with the assessment, diagnosis, and plan of care documented in the resident's note.  

## 2017-03-21 ENCOUNTER — Ambulatory Visit: Payer: Self-pay

## 2017-04-28 ENCOUNTER — Emergency Department (HOSPITAL_COMMUNITY): Payer: Self-pay

## 2017-04-28 ENCOUNTER — Encounter (HOSPITAL_COMMUNITY): Payer: Self-pay | Admitting: Emergency Medicine

## 2017-04-28 ENCOUNTER — Emergency Department (HOSPITAL_COMMUNITY)
Admission: EM | Admit: 2017-04-28 | Discharge: 2017-04-28 | Disposition: A | Discharge status: Home / Self Care | Payer: Self-pay | Attending: Emergency Medicine | Admitting: Emergency Medicine

## 2017-04-28 DIAGNOSIS — Z79899 Other long term (current) drug therapy: Secondary | ICD-10-CM | POA: Insufficient documentation

## 2017-04-28 DIAGNOSIS — J45901 Unspecified asthma with (acute) exacerbation: Secondary | ICD-10-CM

## 2017-04-28 DIAGNOSIS — R05 Cough: Secondary | ICD-10-CM

## 2017-04-28 DIAGNOSIS — Z87891 Personal history of nicotine dependence: Secondary | ICD-10-CM | POA: Insufficient documentation

## 2017-04-28 DIAGNOSIS — Z7982 Long term (current) use of aspirin: Secondary | ICD-10-CM | POA: Insufficient documentation

## 2017-04-28 DIAGNOSIS — R059 Cough, unspecified: Secondary | ICD-10-CM

## 2017-04-28 MED ORDER — IPRATROPIUM-ALBUTEROL 0.5-2.5 (3) MG/3ML IN SOLN
3.0000 mL | Freq: Once | RESPIRATORY_TRACT | Status: AC
  Administered 2017-04-28: 3 mL via RESPIRATORY_TRACT
  Filled 2017-04-28: qty 3

## 2017-04-28 MED ORDER — MOMETASONE FURO-FORMOTEROL FUM 200-5 MCG/ACT IN AERO: 2.0000 | INHALATION_SPRAY | Freq: Two times a day (BID) | RESPIRATORY_TRACT | 0 refills | Status: DC

## 2017-04-28 MED ORDER — PREDNISONE 20 MG PO TABS: 40.0000 mg | ORAL_TABLET | Freq: Every day | ORAL | 0 refills | Status: DC

## 2017-04-28 MED ORDER — METHYLPREDNISOLONE SODIUM SUCC 125 MG IJ SOLR
125.0000 mg | Freq: Once | INTRAMUSCULAR | Status: AC
  Administered 2017-04-28: 125 mg via INTRAMUSCULAR
  Filled 2017-04-28: qty 2

## 2017-04-28 MED ORDER — ALBUTEROL SULFATE HFA 108 (90 BASE) MCG/ACT IN AERS
2.0000 | INHALATION_SPRAY | RESPIRATORY_TRACT | Status: DC | PRN
  Administered 2017-04-28: 2 via RESPIRATORY_TRACT
  Filled 2017-04-28: qty 6.7

## 2017-04-28 NOTE — Progress Notes (Signed)
Treatment administered by RN

## 2017-04-28 NOTE — ED Provider Notes (Signed)
[facility] DEPT Provider Note   CSN: [id number] Arrival date & time: 04/28/17  1902 By signing my name below, I, Dyke Brackett, attest that this documentation has been prepared under the direction and in the presence of non-physician practitioner, Amie Portland, PA-C. Electronically Signed: Dyke Brackett, Scribe. 04/28/2017. 8:02 PM.   History   Chief Complaint Chief Complaint  Patient presents with  . Shortness of Breath    HPI Kathleen [name] is a 56 y.o. female with a history of asthma and bronchitis who presents to the Emergency Department complaining of constant, gradually worsening shortness of breath onset today. Per pt, her symptoms began yesterday with a nonproductive cough. She also reports associated chest tightness, congestion, and voice hoarseness which began today. No OTC treatments tried for these symptoms PTA. Pt is prescribed Dulera, but she expresses that she does not have this due to financial reasons. Also has a nebulizer, but out of albuterol to put in it. No alleviating or modifying factors noted.  Pt denies any fever or sputum prduction and has no other acute complaints at this time.  The history is provided by the patient. No language interpreter was used.   Past Medical History:  Diagnosis Date  . Asthma    2 sets of PFT's in 04/09 without sign variability. Last set with significant decrease in FEV1 with saline alone, suggesting Asthma but  recommended clinical corelation   . Asthma   . Bipolar 1 disorder [facility])    therapist is Caryl Asp and is followed by [facility]  . Blackout    negative work up including ESR, ANA, opthalmology referral, carotid dopplers, 2D echo, MRI and EEG.  . 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 Bronchitis   . Chronic headache   . Chronic pain    normal work up including TSH, RPR, B12, HIV, plain films, 2 ESR's, ANA, CK, RF along with routine CBC, CMET and UA. Further work up  includes CRP, ESR, SPEP/UPEP, hepatitis erology, A1C , repeat ANA  . DUB (dysfunctional uterine bleeding)    and pelvic pain, with negative endometrial bx in 07/09 and transvaginal U/S significant for mild fibroids in 0/09.  Marland Kitchen GERD (gastroesophageal reflux disease)   . Lower extremity edema    Neg ABI's, normal 2D echo, normal albumin  . Menorrhagia   . Ovarian cyst   . Polysubstance abuse    none since March 17,2009.  Marland Kitchen Sleep apnea    NO CPAP  . Tubular adenoma of colon     Patient Active Problem List   Diagnosis Date Noted  . Odynophagia 08/10/2016  . Partial Achilles tendon tear 08/10/2016  . Lumbar radiculopathy 07/26/2016  . Wound infection 07/20/2016  . Tooth loose 03/24/2016  . Left hip pain 02/05/2016  . Depression 09/19/2015  . Asthmatic bronchitis 09/19/2015  . Hot flashes 02/05/2015  . Flashing lights seen 02/05/2015  . Abnormal uterine bleeding 07/24/2014  . Left knee pain 06/18/2014  . Healthcare maintenance 06/18/2014  . Menorrhagia 05/24/2014  . Dyspareunia 05/24/2014  . Breast lump in female and tenderness 05/24/2014  . Protein-calorie malnutrition, severe ([place]) 05/15/2014  . Asthma 05/13/2014  . Neck pain on left side 01/19/2012  . DVT 12/21/2010  . ABNORMAL VAGINAL BLEEDING 12/21/2010  . BIPOLAR DISORDER UNSPECIFIED 01/21/2010  . GERD 01/21/2010  . CHEST PAIN, ATYPICAL 01/21/2010  . ARTHRITIS, GENERALIZED 10/08/2008  . SNORING 10/08/2008  . DISTURBANCE OF SKIN SENSATION 08/29/2008  .  Chronic low back pain 08/08/2008  . INSOMNIA 05/31/2008  . VISION LOSS, SUDDEN 03/25/2008  . BREAST PAIN, BILATERAL 03/25/2008  . BREAST LUMP 03/25/2008  . LEG PAIN, BILATERAL 03/19/2008  . HALLUCINATIONS 03/18/2008  . GENERALIZED ANXIETY DISORDER 03/14/2008  . DRUG ABUSE 03/14/2008  . MIGRAINE VARIANT 03/14/2008    Past Surgical History:  Procedure Laterality Date  . CESAREAN SECTION    . CESAREAN SECTION    . HERNIA REPAIR    . Left partial oophorectomy      . OOPHORECTOMY     1/2 ovary removed  . VAGINA SURGERY     mesh    OB History    Gravida Para Term Preterm AB Living   3 2 2 0 1     SAB TAB Ectopic Multiple Live Births   0 1 0          Home Medications    Prior to Admission medications   Medication Sig Start Date End Date Taking? Authorizing Provider  albuterol (PROVENTIL HFA;VENTOLIN HFA) 108 (90 Base) MCG/ACT inhaler Inhale 2 puffs into the lungs every 4 (four) hours as needed for wheezing or shortness of breath. 03/14/17   Strelow, Bryan, MD  aspirin EC 81 MG tablet Take 81 mg by mouth daily.    [provider]  EPINEPHrine 0.3 mg/0.3 mL IJ SOAJ injection Inject 0.3 mLs (0.3 mg total) into the muscle once. Patient not taking: Reported on 12/17/2016 09/17/15   Tran, Bowie, PA-C  loratadine (CLARITIN) 10 MG tablet Take 1 tablet (10 mg total) by mouth daily. 03/14/17 03/14/18  Strelow, Bryan, MD  mometasone-formoterol (DULERA) 200-5 MCG/ACT AERO Inhale 2 puffs into the lungs 2 (two) times daily. 04/28/17   ,  Pilcher, PA-C  naproxen (NAPROSYN) 500 MG tablet Take 1 tablet (500 mg total) by mouth 2 (two) times daily. 01/11/17   Rose, Kayla, PA-C  omeprazole (PRILOSEC) 20 MG capsule Take 1 capsule (20 mg total) by mouth daily. 03/24/16 03/24/17  Gill, Jacquelyn S, MD  predniSONE (DELTASONE) 20 MG tablet Take 2 tablets (40 mg total) by mouth daily. 04/28/17   ,  Pilcher, PA-C    Family History Family History  Problem Relation Age of Onset  . Colon cancer Mother 85  . Cancer Mother   . Diabetes Father   . Hypertension Father   . Kidney disease Father   . Colon cancer Father 79  . Prostate cancer Father   . Cancer Father   . Kidney disease Sister   . Cancer Brother     Social History Social History  Substance Use Topics  . Smoking status: Former Smoker    Packs/day: 0.25    Years: 30.00    Types: Cigarettes  . Smokeless tobacco: Never Used  . Alcohol use No     Allergies   Topiramate and  Tramadol   Review of Systems Review of Systems  Constitutional: Negative for fever.  HENT: Positive for congestion and voice change.   Respiratory: Positive for cough, chest tightness and shortness of breath.    Physical Exam Updated Vital Signs BP 130/77 (BP Location: Left Arm)   Pulse 91   Temp 98 F (36.7 C) (Oral)   Resp 20   SpO2 100%   Physical Exam  Constitutional: She appears well-developed and well-nourished. No distress.  HENT:  Head: Normocephalic and atraumatic.  Neck: Neck supple.  Cardiovascular: Normal rate, regular rhythm and normal heart sounds.   No murmur heard. Pulmonary/Chest: Effort normal. No respiratory   distress. She has wheezes. She has no rales.  Inspiratory and expiratory wheezing bilaterally. Able to speak in full sentences.   Musculoskeletal: Normal range of motion.  Neurological: She is alert.  Skin: Skin is warm and dry.  Nursing note and vitals reviewed.  ED Treatments / Results  DIAGNOSTIC STUDIES:  Oxygen Saturation is 100% on RA, normal by my interpretation.    COORDINATION OF CARE:  8:00 PM Discussed treatment plan with pt which includes CXR and breathing tx at bedside and pt agreed to plan.   Labs (all labs ordered are listed, but only abnormal results are displayed) Labs Reviewed - No data to display  EKG  EKG Interpretation None       Radiology Dg Chest 2 View  Result Date: 04/28/2017 CLINICAL DATA:  Asthmatic bronchitis nonproductive cough EXAM: CHEST  2 VIEW COMPARISON:  12/17/2016 FINDINGS: Symmetrical nipple shadows. No focal consolidation or effusion. Normal heart size. No pneumothorax. Mild degenerative changes of the spine. Artifact at the thoracic inlet. IMPRESSION: No active cardiopulmonary disease. Electronically Signed   By: Kim  Fujinaga M.D.   On: 04/28/2017 20:46    Procedures Procedures (including critical care time)  Medications Ordered in ED Medications  albuterol (PROVENTIL HFA;VENTOLIN HFA) 108  (90 Base) MCG/ACT inhaler 2 puff (2 puffs Inhalation Given 04/28/17 2054)  ipratropium-albuterol (DUONEB) 0.5-2.5 (3) MG/3ML nebulizer solution 3 mL (3 mLs Nebulization Given 04/28/17 2018)  methylPREDNISolone sodium succinate (SOLU-MEDROL) 125 mg/2 mL injection 125 mg (125 mg Intramuscular Given 04/28/17 2018)  ipratropium-albuterol (DUONEB) 0.5-2.5 (3) MG/3ML nebulizer solution 3 mL (3 mLs Nebulization Given 04/28/17 2123)     Initial Impression / Assessment and Plan / ED Course  I have reviewed the triage vital signs and the nursing notes.  Pertinent labs & imaging results that were available during my care of the patient were reviewed by me and considered in my medical decision making (see chart for details).    Kathleen [name] is a 55 y.o. female who presents to ED for wheezing c/w typical asthma flares. Initially, afebrile, hemodynamically stable with inspiratory and expiratory wheezing. Speaking in full sentences. Duoneb x 2 given along with 125 mg solu-medrol. Patient feels improved on recheck and comfortable with discharge home. Wheezing improved as well. 100% O2 on RA. Refilled home inhalers which she has been out of. Rx for steroid burst given as well. Reasons to return to ER discussed. PCP follow up encouraged. All questions answered.    Final Clinical Impressions(s) / ED Diagnoses   Final diagnoses:  Cough  Exacerbation of asthma, unspecified asthma severity, unspecified whether persistent    New Prescriptions New Prescriptions   MOMETASONE-FORMOTEROL (DULERA) 200-5 MCG/ACT AERO    Inhale 2 puffs into the lungs 2 (two) times daily.   PREDNISONE (DELTASONE) 20 MG TABLET    Take 2 tablets (40 mg total) by mouth daily.   I personally performed the services described in this documentation, which was scribed in my presence. The recorded information has been reviewed and is accurate.    ,  Pilcher, PA-C 04/28/17 2215    Kohut, Stephen, MD 05/07/17 0716  

## 2017-04-28 NOTE — Discharge Instructions (Signed)
It was my pleasure taking care of you today!  ° °Take prednisone daily starting tomorrow morning.  °I have given you a refill of your home inhaler.  ° °Follow up with your primary care provider for further discussion of today's ER visit.  ° °If you develop any new or worsening symptoms, including but not limited to fever, persistent vomiting, worsening shortness of breath or other symptoms that concern you, please return to the Emergency Department immediately. ° °

## 2017-04-28 NOTE — ED Triage Notes (Signed)
Pt states she has asthmatic bronchitis and is here to get a breathing treatment  Pt has nonproductive cough in triage

## 2017-05-20 ENCOUNTER — Encounter: Payer: Self-pay | Admitting: *Deleted

## 2017-06-16 NOTE — Telephone Encounter (Signed)
A user error has taken place: encounter opened in error, closed for administrative reasons.

## 2017-06-29 ENCOUNTER — Telehealth: Payer: Self-pay

## 2017-06-29 NOTE — Telephone Encounter (Signed)
Needs to speak with a nurse about meds. Please call back.  

## 2017-06-30 ENCOUNTER — Encounter: Payer: Self-pay | Admitting: Internal Medicine

## 2017-06-30 NOTE — Telephone Encounter (Signed)
She may get list of hydrocodone RF from pharmacy. We cannot print that info from EPIC.  Insomnia is on PL but an A/P has never been documented since EPIC (2012ish) adopt. I can supply a letter that is on PL but NEVER discussed with physician. She probably will not want that letter.

## 2017-06-30 NOTE — Telephone Encounter (Signed)
Call from patient requesting a "handwritten" note stating that she has a diagnosis of insomnia (does not need the date diagnosed).   Pt needs this information for an appt that she has scheduled for 11am today with [employer] ([employer]). She is also requesting a list of medications that has been prescribed to her since 2016.  When further questioned about medication list, she stated that she only needs the refill history for the hydrocodone that was prescribed in the past for her headaches.  Pt was informed that her her pcp is currently out of the office and has never seen her. She was also informed that clinic policy is to allow at least 5-7 business days for letters.   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  Will send info to attending pool for review, please advise.Regenia Skeeter, Darlene Cassady7/19/20189:20 AM    EEOC Overview: The [place] Equal Employment Event organiser) is responsible for enforcing federal laws that make it illegal to discriminate against a job Museum/gallery exhibitions officer or an employee because of the person's race, color, religion, sex (including pregnancy, gender identity, and sexual orientation), national origin, age (25 or older), disability or genetic information. It is also illegal to discriminate against a person because the person complained about discrimination, filed a charge of discrimination, or participated in an employment discrimination investigation or lawsuit.

## 2017-07-16 ENCOUNTER — Emergency Department (HOSPITAL_COMMUNITY)
Admission: EM | Admit: 2017-07-16 | Discharge: 2017-07-16 | Disposition: A | Discharge status: Home / Self Care | Payer: Self-pay | Attending: Emergency Medicine | Admitting: Emergency Medicine

## 2017-07-16 ENCOUNTER — Encounter (HOSPITAL_COMMUNITY): Payer: Self-pay | Admitting: Emergency Medicine

## 2017-07-16 ENCOUNTER — Emergency Department (HOSPITAL_COMMUNITY): Payer: Self-pay

## 2017-07-16 DIAGNOSIS — Z7982 Long term (current) use of aspirin: Secondary | ICD-10-CM | POA: Insufficient documentation

## 2017-07-16 DIAGNOSIS — J45909 Unspecified asthma, uncomplicated: Secondary | ICD-10-CM | POA: Insufficient documentation

## 2017-07-16 DIAGNOSIS — T782XXA Anaphylactic shock, unspecified, initial encounter: Secondary | ICD-10-CM | POA: Insufficient documentation

## 2017-07-16 DIAGNOSIS — Z87891 Personal history of nicotine dependence: Secondary | ICD-10-CM | POA: Insufficient documentation

## 2017-07-16 DIAGNOSIS — Z79899 Other long term (current) drug therapy: Secondary | ICD-10-CM | POA: Insufficient documentation

## 2017-07-16 LAB — CBC
HEMATOCRIT: 41.4 % (ref 36.0–46.0)
HEMOGLOBIN: 13.9 g/dL (ref 12.0–15.0)
MCH: 28.4 pg (ref 26.0–34.0)
MCHC: 33.6 g/dL (ref 30.0–36.0)
MCV: 84.7 fL (ref 78.0–100.0)
PLATELETS: 344 10*3/uL (ref 150–400)
RBC: 4.89 MIL/uL (ref 3.87–5.11)
RDW: 13.9 % (ref 11.5–15.5)
WBC: 8.3 10*3/uL (ref 4.0–10.5)

## 2017-07-16 LAB — BASIC METABOLIC PANEL
ANION GAP: 8 (ref 5–15)
BUN: 12 mg/dL (ref 6–20)
CALCIUM: 8.6 mg/dL — AB (ref 8.9–10.3)
CO2: 28 mmol/L (ref 22–32)
CREATININE: 1.54 mg/dL — AB (ref 0.44–1.00)
Chloride: 104 mmol/L (ref 101–111)
GFR calc Af Amer: 42 mL/min — ABNORMAL LOW (ref >60)
GFR, EST NON AFRICAN AMERICAN: 37 mL/min — AB (ref >60)
GLUCOSE: 82 mg/dL (ref 65–99)
Potassium: 4.8 mmol/L (ref 3.5–5.1)
Sodium: 140 mmol/L (ref 135–145)

## 2017-07-16 MED ORDER — DIPHENHYDRAMINE HCL 50 MG/ML IJ SOLN
50.0000 mg | Freq: Once | INTRAMUSCULAR | Status: AC
  Administered 2017-07-16: 50 mg via INTRAVENOUS
  Filled 2017-07-16: qty 1

## 2017-07-16 MED ORDER — SODIUM CHLORIDE 0.9 % IV BOLUS (SEPSIS)
1000.0000 mL | Freq: Once | INTRAVENOUS | Status: AC
  Administered 2017-07-16: 1000 mL via INTRAVENOUS

## 2017-07-16 MED ORDER — PREDNISONE 20 MG PO TABS: 60.0000 mg | ORAL_TABLET | Freq: Every day | ORAL | 0 refills | Status: AC

## 2017-07-16 MED ORDER — DIPHENHYDRAMINE HCL 25 MG PO TABS: 25.0000 mg | ORAL_TABLET | Freq: Four times a day (QID) | ORAL | 0 refills | Status: DC

## 2017-07-16 MED ORDER — EPINEPHRINE 0.3 MG/0.3ML IJ SOAJ: 0.3000 mg | Freq: Once | INTRAMUSCULAR | 0 refills | Status: AC

## 2017-07-16 MED ORDER — ALBUTEROL (5 MG/ML) CONTINUOUS INHALATION SOLN
10.0000 mg/h | INHALATION_SOLUTION | Freq: Once | RESPIRATORY_TRACT | Status: AC
  Administered 2017-07-16: 10 mg/h via RESPIRATORY_TRACT
  Filled 2017-07-16: qty 20

## 2017-07-16 MED ORDER — RANITIDINE HCL 150 MG PO CAPS: 150.0000 mg | ORAL_CAPSULE | Freq: Two times a day (BID) | ORAL | 0 refills | Status: DC

## 2017-07-16 MED ORDER — FAMOTIDINE IN NACL 20-0.9 MG/50ML-% IV SOLN
20.0000 mg | Freq: Once | INTRAVENOUS | Status: AC
  Administered 2017-07-16: 20 mg via INTRAVENOUS
  Filled 2017-07-16: qty 50

## 2017-07-16 MED ORDER — METHYLPREDNISOLONE SODIUM SUCC 125 MG IJ SOLR
125.0000 mg | Freq: Once | INTRAMUSCULAR | Status: AC
  Administered 2017-07-16: 125 mg via INTRAVENOUS
  Filled 2017-07-16: qty 2

## 2017-07-16 MED ORDER — KETOROLAC TROMETHAMINE 15 MG/ML IJ SOLN
15.0000 mg | Freq: Once | INTRAMUSCULAR | Status: AC
  Administered 2017-07-16: 15 mg via INTRAVENOUS
  Filled 2017-07-16: qty 1

## 2017-07-16 MED ORDER — EPINEPHRINE 0.3 MG/0.3ML IJ SOAJ
0.3000 mg | Freq: Once | INTRAMUSCULAR | Status: AC
  Administered 2017-07-16: 0.3 mg via INTRAMUSCULAR
  Filled 2017-07-16: qty 0.3

## 2017-07-16 NOTE — ED Notes (Signed)
Pt ambulated down the hall without needing assistance.

## 2017-07-16 NOTE — ED Provider Notes (Signed)
[facility] DEPT Provider Note   CSN: [id number] Arrival date & time: 07/16/17  1719     History   Chief Complaint Chief Complaint  Patient presents with  . Allergic Reaction    HPI Kathleen [name] is a 56 y.o. female.  HPI   56 year old female with history of asthma here with diffuse hives and shortness of breath. The patient states she is currently watching her brother's dog. She awoke this morning with hives on her neck, bilateral arms, and abdomen. She describes them as itchy. She had no shortness of breath at that time. She reportedly gave herself a dose of EpiPen at 8 AM and this did not improve her eyes. Throughout the day, she has had worsening hives and has now developed shortness of breath. She states that because of her itchiness, she left the house to come to the ER. When she left the house, she reports wheezing and shortness of breath. She also endorses a sensation of her throat being tight. She just took Benadryl once throughout the day today, but has not repeated dose. Denies any preceding fevers or chills. Denies any cough or sputum impression. She is denies any other new allergen exposures. No recent new clothes or detergents. No other medical complaints. No CP.  Past Medical History:  Diagnosis Date  . Asthma    2 sets of PFT's in 04/09 without sign variability. Last set with significant decrease in FEV1 with saline alone, suggesting Asthma but  recommended clinical corelation   . Asthma   . Bipolar 1 disorder [facility])    therapist is Caryl Asp and is followed by [employer]  . Blackout    negative work up including ESR, ANA, opthalmology referral, carotid dopplers, 2D echo, MRI and EEG.  . 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 Bronchitis   . Chronic headache   . Chronic pain    normal work up including TSH, RPR, B12, HIV, plain films, 2 ESR's, ANA, CK, RF along with routine CBC, CMET and UA. Further work up  includes CRP, ESR, SPEP/UPEP, hepatitis erology, A1C , repeat ANA  . DUB (dysfunctional uterine bleeding)    and pelvic pain, with negative endometrial bx in 07/09 and transvaginal U/S significant for mild fibroids in 0/09.  Marland Kitchen GERD (gastroesophageal reflux disease)   . Lower extremity edema    Neg ABI's, normal 2D echo, normal albumin  . Menorrhagia   . Ovarian cyst   . Polysubstance abuse    none since March 17,2009.  Marland Kitchen Sleep apnea    NO CPAP  . Tubular adenoma of colon     Patient Active Problem List   Diagnosis Date Noted  . Odynophagia 08/10/2016  . Partial Achilles tendon tear 08/10/2016  . Lumbar radiculopathy 07/26/2016  . Wound infection 07/20/2016  . Tooth loose 03/24/2016  . Left hip pain 02/05/2016  . Depression 09/19/2015  . Asthmatic bronchitis 09/19/2015  . Hot flashes 02/05/2015  . Flashing lights seen 02/05/2015  . Abnormal uterine bleeding 07/24/2014  . Left knee pain 06/18/2014  . Healthcare maintenance 06/18/2014  . Menorrhagia 05/24/2014  . Dyspareunia 05/24/2014  . Breast lump in female and tenderness 05/24/2014  . Protein-calorie malnutrition, severe ([place]) 05/15/2014  . Asthma 05/13/2014  . Neck pain on left side 01/19/2012  . DVT 12/21/2010  . ABNORMAL VAGINAL BLEEDING 12/21/2010  . BIPOLAR DISORDER UNSPECIFIED 01/21/2010  . GERD 01/21/2010  . CHEST PAIN, ATYPICAL 01/21/2010  .  ARTHRITIS, GENERALIZED 10/08/2008  . SNORING 10/08/2008  . DISTURBANCE OF SKIN SENSATION 08/29/2008  . Chronic low back pain 08/08/2008  . INSOMNIA 05/31/2008  . VISION LOSS, SUDDEN 03/25/2008  . BREAST PAIN, BILATERAL 03/25/2008  . BREAST LUMP 03/25/2008  . LEG PAIN, BILATERAL 03/19/2008  . HALLUCINATIONS 03/18/2008  . GENERALIZED ANXIETY DISORDER 03/14/2008  . DRUG ABUSE 03/14/2008  . MIGRAINE VARIANT 03/14/2008    Past Surgical History:  Procedure Laterality Date  . CESAREAN SECTION    . CESAREAN SECTION    . HERNIA REPAIR    . Left partial oophorectomy      . OOPHORECTOMY     1/2 ovary removed  . VAGINA SURGERY     mesh    OB History    Gravida Para Term Preterm AB Living   '3 2 2 ' 0 1     SAB TAB Ectopic Multiple Live Births   0 1 0           Home Medications    Prior to Admission medications   Medication Sig Start Date End Date Taking? Authorizing Provider  albuterol (PROVENTIL HFA;VENTOLIN HFA) 108 (90 Base) MCG/ACT inhaler Inhale 2 puffs into the lungs every 4 (four) hours as needed for wheezing or shortness of breath. 03/14/17   Holley Raring, MD  aspirin EC 81 MG tablet Take 81 mg by mouth daily.    [provider]  diphenhydrAMINE (BENADRYL) 25 MG tablet Take 1 tablet (25 mg total) by mouth every 6 (six) hours. 07/16/17 07/21/17  Duffy Bruce, MD  loratadine (CLARITIN) 10 MG tablet Take 1 tablet (10 mg total) by mouth daily. 03/14/17 03/14/18  Holley Raring, MD  mometasone-formoterol (DULERA) 200-5 MCG/ACT AERO Inhale 2 puffs into the lungs 2 (two) times daily. 04/28/17   Ward, Ozella Almond, PA-C  naproxen (NAPROSYN) 500 MG tablet Take 1 tablet (500 mg total) by mouth 2 (two) times daily. 01/11/17   Gloriann Loan, PA-C  omeprazole (PRILOSEC) 20 MG capsule Take 1 capsule (20 mg total) by mouth daily. 03/24/16 03/24/17  Jones Bales, MD  predniSONE (DELTASONE) 20 MG tablet Take 3 tablets (60 mg total) by mouth daily. 07/16/17 07/21/17  Duffy Bruce, MD  ranitidine (ZANTAC) 150 MG capsule Take 1 capsule (150 mg total) by mouth 2 (two) times daily. 07/16/17 07/21/17  Duffy Bruce, MD    Family History Family History  Problem Relation Age of Onset  . Colon cancer Mother 57  . Cancer Mother   . Diabetes Father   . Hypertension Father   . Kidney disease Father   . Colon cancer Father 27  . Prostate cancer Father   . Cancer Father   . Kidney disease Sister   . Cancer Brother     Social History Social History  Substance Use Topics  . Smoking status: Former Smoker    Packs/day: 0.25    Years: 30.00    Types: Cigarettes   . Smokeless tobacco: Never Used  . Alcohol use No     Allergies   Topiramate and Tramadol   Review of Systems Review of Systems  Constitutional: Positive for fatigue. Negative for chills and fever.  HENT: Positive for trouble swallowing. Negative for congestion, rhinorrhea and sore throat.   Eyes: Negative for visual disturbance.  Respiratory: Positive for shortness of breath and wheezing. Negative for cough.   Cardiovascular: Negative for chest pain and leg swelling.  Gastrointestinal: Negative for abdominal pain, diarrhea, nausea and vomiting.  Genitourinary: Negative for dysuria, flank pain,  vaginal bleeding and vaginal discharge.  Musculoskeletal: Negative for neck pain.  Skin: Negative for rash.  Allergic/Immunologic: Negative for immunocompromised state.  Neurological: Negative for syncope and headaches.  Hematological: Does not bruise/bleed easily.  All other systems reviewed and are negative.    Physical Exam Updated Vital Signs BP 101/69   Pulse 83   Temp 98.1 F (36.7 C) (Oral)   Resp 18   Ht 5' 2.5" (1.588 m)   Wt 68 kg (150 lb)   SpO2 97%   BMI 27.00 kg/m   Physical Exam  Constitutional: She is oriented to person, place, and time. She appears well-developed and well-nourished. No distress.  HENT:  Head: Normocephalic and atraumatic.  Oropharynx widely patent with no pooling of secretions.  Eyes: Conjunctivae are normal.  Neck: Neck supple.  Cardiovascular: Normal rate, regular rhythm and normal heart sounds.  Exam reveals no friction rub.   No murmur heard. Pulmonary/Chest: Effort normal. Tachypnea noted. No respiratory distress. She has decreased breath sounds. She has wheezes. She has no rales.  Abdominal: She exhibits no distension.  Musculoskeletal: She exhibits no edema.  Neurological: She is alert and oriented to person, place, and time. She exhibits normal muscle tone.  Skin: Skin is warm. Capillary refill takes less than 2 seconds.  Diffuse  urticaria throughout bilateral upper extremities and chest. No induration or fluctuance.  Psychiatric: She has a normal mood and affect.  Nursing note and vitals reviewed.    ED Treatments / Results  Labs (all labs ordered are listed, but only abnormal results are displayed) Labs Reviewed  BASIC METABOLIC PANEL - Abnormal; Notable for the following:       Result Value   Creatinine, Ser 1.54 (*)    Calcium 8.6 (*)    GFR calc non Af Amer 37 (*)    GFR calc Af Amer 42 (*)    All other components within normal limits  CBC    EKG  EKG Interpretation None       Radiology Dg Chest Portable 1 View  Result Date: 07/16/2017 CLINICAL DATA:  Allergic reaction today; hx bronchitis; asthma; ex smoker EXAM: PORTABLE CHEST 1 VIEW COMPARISON:  Chest x-ray dated 04/28/2017. FINDINGS: The heart size and mediastinal contours are within normal limits. Both lungs are clear. Lung volumes are normal. No pleural effusion or pneumothorax seen. The visualized skeletal structures are unremarkable. IMPRESSION: No active disease.  No evidence of pneumonia or pulmonary edema. Electronically Signed   By: Franki Cabot M.D.   On: 07/16/2017 18:12    Procedures .Critical Care Performed by: Duffy Bruce Authorized by: Duffy Bruce   Critical care provider statement:    Critical care time (minutes):  35    (including critical care time)  CRITICAL CARE Performed by: Evonnie Pat   Total critical care time: 35 minutes  Critical care time was exclusive of separately billable procedures and treating other patients.  Critical care was necessary to treat or prevent imminent or life-threatening deterioration.  Critical care was time spent personally by me on the following activities: development of treatment plan with patient and/or surrogate as well as nursing, discussions with consultants, evaluation of patient's response to treatment, examination of patient, obtaining history from patient or  surrogate, ordering and performing treatments and interventions, ordering and review of laboratory studies, ordering and review of radiographic studies, pulse oximetry and re-evaluation of patient's condition.    Medications Ordered in ED Medications  EPINEPHrine (EPI-PEN) injection 0.3 mg (0.3 mg Intramuscular Given 07/16/17  1750)  methylPREDNISolone sodium succinate (SOLU-MEDROL) 125 mg/2 mL injection 125 mg (125 mg Intravenous Given 07/16/17 1746)  diphenhydrAMINE (BENADRYL) injection 50 mg (50 mg Intravenous Given 07/16/17 1746)  famotidine (PEPCID) IVPB 20 mg premix (0 mg Intravenous Stopped 07/16/17 1814)  albuterol (PROVENTIL,VENTOLIN) solution continuous neb (10 mg/hr Nebulization Given 07/16/17 1809)  sodium chloride 0.9 % bolus 1,000 mL (0 mLs Intravenous Stopped 07/16/17 2022)  ketorolac (TORADOL) 15 MG/ML injection 15 mg (15 mg Intravenous Given 07/16/17 2022)     Initial Impression / Assessment and Plan / ED Course  I have reviewed the triage vital signs and the nursing notes.  Pertinent labs & imaging results that were available during my care of the patient were reviewed by me and considered in my medical decision making (see chart for details).     56 year old female here with pruritic, urticarial rash and wheezing in setting of exposure to her brother's dog. Suspect allergic reaction with early anaphylaxis. Patient given epinephrine, steroids, antihistamines, as well as breathing treatment here. Will plan to monitor. No signs of impending airway compromise. She was well prior to the onset of these symptoms.  Labwork as above. The patient does have mildly elevated creatinine, but has been given fluids here. Otherwise, she feels markedly improved and was monitored for greater than 3.5 hours after epinephrine with no return of symptoms. She is able to walk without difficulty with resolution of wheezing and normal work of breathing. Will discharge with outpatient steroids and continue  antihistamines, as well as refill on EpiPen.  Final Clinical Impressions(s) / ED Diagnoses   Final diagnoses:  Anaphylaxis, initial encounter    New Prescriptions Discharge Medication List as of 07/16/2017  9:54 PM    START taking these medications   Details  diphenhydrAMINE (BENADRYL) 25 MG tablet Take 1 tablet (25 mg total) by mouth every 6 (six) hours., Starting Sat 07/16/2017, Until Thu 07/21/2017, Print    ranitidine (ZANTAC) 150 MG capsule Take 1 capsule (150 mg total) by mouth 2 (two) times daily., Starting Sat 07/16/2017, Until Thu 07/21/2017, Print         Duffy Bruce, MD 07/17/17 1019

## 2017-07-16 NOTE — ED Notes (Signed)
RT called for cont neb

## 2017-07-16 NOTE — ED Triage Notes (Signed)
Pt from home with allergic reaction. Pt reports that she had hives and itching this am and took 50 mg benadryl and used her epi-pen. Pt had the same reaction PTA . Pt arrives with hives and itching. Pt sts she does not know she is reacting to. Pt is A&O and in NAD. Pt maintain airway and breathing w/o difficulty.,

## 2017-11-17 ENCOUNTER — Encounter: Payer: Self-pay | Admitting: Internal Medicine

## 2017-12-28 ENCOUNTER — Emergency Department (HOSPITAL_COMMUNITY)
Admission: EM | Admit: 2017-12-28 | Discharge: 2017-12-28 | Discharge status: Against Medical Advice | Payer: Self-pay | Attending: Emergency Medicine | Admitting: Emergency Medicine

## 2017-12-28 ENCOUNTER — Encounter (HOSPITAL_COMMUNITY): Payer: Self-pay | Admitting: Emergency Medicine

## 2017-12-28 ENCOUNTER — Emergency Department (HOSPITAL_COMMUNITY): Payer: Self-pay

## 2017-12-28 DIAGNOSIS — Z5321 Procedure and treatment not carried out due to patient leaving prior to being seen by health care provider: Secondary | ICD-10-CM | POA: Insufficient documentation

## 2017-12-28 MED ORDER — ALBUTEROL SULFATE (2.5 MG/3ML) 0.083% IN NEBU
5.0000 mg | INHALATION_SOLUTION | Freq: Once | RESPIRATORY_TRACT | Status: AC
  Administered 2017-12-28: 5 mg via RESPIRATORY_TRACT
  Filled 2017-12-28: qty 6

## 2017-12-28 NOTE — ED Triage Notes (Signed)
Patient c/o asthma/SOb that started today. Patient reports that she is out of her inhaler

## 2017-12-28 NOTE — ED Notes (Signed)
Patient states that she has to go pick up her son. Reports feels much better and sees her PCP on Friday.

## 2017-12-28 NOTE — ED Triage Notes (Signed)
Patient c/o asthma/SOB that started today. Patient reports she is out of her inhaler.

## 2018-01-05 ENCOUNTER — Ambulatory Visit (INDEPENDENT_AMBULATORY_CARE_PROVIDER_SITE_OTHER): Payer: Self-pay | Admitting: Internal Medicine

## 2018-01-05 ENCOUNTER — Encounter: Payer: Self-pay | Admitting: Internal Medicine

## 2018-01-05 ENCOUNTER — Other Ambulatory Visit: Payer: Self-pay | Admitting: Pharmacist

## 2018-01-05 ENCOUNTER — Other Ambulatory Visit: Payer: Self-pay

## 2018-01-05 VITALS — BP 110/76 | HR 96 | Temp 97.9°F | Ht 62.0 in | Wt 157.8 lb

## 2018-01-05 DIAGNOSIS — Z87891 Personal history of nicotine dependence: Secondary | ICD-10-CM

## 2018-01-05 DIAGNOSIS — J069 Acute upper respiratory infection, unspecified: Secondary | ICD-10-CM

## 2018-01-05 DIAGNOSIS — J45901 Unspecified asthma with (acute) exacerbation: Secondary | ICD-10-CM

## 2018-01-05 MED ORDER — PREDNISONE 10 MG PO TABS: ORAL_TABLET | ORAL | 0 refills | Status: DC

## 2018-01-05 MED ORDER — ALBUTEROL SULFATE HFA 108 (90 BASE) MCG/ACT IN AERS: 2.0000 | INHALATION_SPRAY | RESPIRATORY_TRACT | 6 refills | Status: DC | PRN

## 2018-01-05 MED ORDER — ALBUTEROL SULFATE (2.5 MG/3ML) 0.083% IN NEBU: 2.5000 mg | INHALATION_SOLUTION | Freq: Four times a day (QID) | RESPIRATORY_TRACT | 2 refills | Status: DC | PRN

## 2018-01-05 MED FILL — predniSONE 10 MG TABS: 10 | 15 days supply | Qty: 32 | Fill #0

## 2018-01-05 NOTE — Progress Notes (Signed)
CC: Cough  HPI:  [name] is a 57 y.o. with Asthma who presented to the [facility] clinic for evaluation of cough and shortness of breath. She states that she has been dealing with exertional dyspnea for approximately 4 weeks now. However over the past 1-2 weeks she has become short of breath with even the slightest movement. She typically takes her inhalers twice daily; however, over the past 1-2 weeks she has had to use her albuterol inhaler at least 4 times a day in addition to her nebulizer twice a day. She is also experiencing productive cough. She states that her sputum is clear. She coughs so much that sometimes she will experience posttussive emesis. She states that she has had multiple sick contact including all her family members. Her brother recently had pneumonia and her son recently had bronchitis. She states that over the last month she is just felt run down. She is dealing with multiple life stressors including her sister who just passed, and her brother who has bladder cancer. She states that she has had prednisone tapers for acute asthma exacerbations in the past and has had to be hospitalized. She has been intubated in the past for an asthma exacerbation. She does not do daily lung volume testing.    Other associated symptoms include headaches, rhinorrhea, sore throat, myalgias, emesis, abdominal/chest pain (from coughing), and fevers. She has not taken any other medication for this acute illness. She did present to the ED approximately 1 week prior but left before being seen.   Past Medical History:  Diagnosis Date  . Asthma    2 sets of PFT's in 04/09 without sign variability. Last set with significant decrease in FEV1 with saline alone, suggesting Asthma but  recommended clinical corelation   . Asthma   . Bipolar 1 disorder [facility])    therapist is Caryl Asp and is followed by [employer]  . Blackout    negative work up including ESR, ANA, opthalmology referral, carotid  dopplers, 2D echo, MRI and EEG.  . 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 Bronchitis   . Chronic headache   . Chronic pain    normal work up including TSH, RPR, B12, HIV, plain films, 2 ESR's, ANA, CK, RF along with routine CBC, CMET and UA. Further work up includes CRP, ESR, SPEP/UPEP, hepatitis erology, A1C , repeat ANA  . DUB (dysfunctional uterine bleeding)    and pelvic pain, with negative endometrial bx in 07/09 and transvaginal U/S significant for mild fibroids in 0/09.  Marland Kitchen GERD (gastroesophageal reflux disease)   . Lower extremity edema    Neg ABI's, normal 2D echo, normal albumin  . Menorrhagia   . Ovarian cyst   . Polysubstance abuse ([place])    none since March 17,2009.  Marland Kitchen Sleep apnea    NO CPAP  . Tubular adenoma of colon    Review of Systems:   12 point review of systems preformed. All negative aside from those mentions in the HPI.   Physical Exam: Vitals:   01/05/18 1427  BP: 110/76  Pulse: 96  Temp: 97.9 F (36.6 C)  TempSrc: Oral  SpO2: 100%  Weight: 157 lb 12.8 oz (71.6 kg)  Height: '5\' 2"'  (1.575 m)   General: Well nourished female in no acute distress HENT: Normocephalic, atraumatic, moist mucus membranes, TM clear with good light reflexes bilaterally, oropharynx is clear without exudates, no LAD Pulm: Diffuse inspiratory  and expiratory wheezing  CV: RRR, no murmurs, no rubs  Abdomen: Active bowel sounds, soft, non-distended, no tenderness to palpation  Extremities: No LE edema, radial pulses palpable bilaterally   Skin: Warm and dry  Neuro: Alert and oriented x 3  Assessment & Plan:   See Encounters Tab for problem based charting.  Patient discussed with Dr. Beryle Beams

## 2018-01-05 NOTE — Progress Notes (Signed)
Medicine attending: Medical history, presenting problems, physical findings, and medications, reviewed with resident physician Dr Marlowe Aschoff on the day of the patient visit and I concur with his evaluation and management plan. Exacerbation of asthma secondary to a URI. No red flag signs. We will prescribe short course of steroids & renew her bronchodilators.

## 2018-01-05 NOTE — Patient Instructions (Addendum)
Thank you for allowing [place] to provide your care. It was a pleasure to meet you. Please schedule an appointment with the [facility] early next week on your way out. Please take your prednisone as prescribed.   Please go to the [facility] outpatient pharmacy to get your prescriptions: [address] [place], [place] [address]    Asthma, Acute Bronchospasm Acute bronchospasm caused by asthma is also referred to as an asthma attack. Bronchospasm means your air passages become narrowed. The narrowing is caused by inflammation and tightening of the muscles in the air tubes (bronchi) in your lungs. This can make it hard to breathe or cause you to wheeze and cough. What are the causes? Possible triggers are:  Animal dander from the skin, hair, or feathers of animals.  Dust mites contained in house dust.  Cockroaches.  Pollen from trees or grass.  Mold.  Cigarette or tobacco smoke.  Air pollutants such as dust, household cleaners, hair sprays, aerosol sprays, paint fumes, strong chemicals, or strong odors.  Cold air or weather changes. Cold air may trigger inflammation. Winds increase molds and pollens in the air.  Strong emotions such as crying or laughing hard.  Stress.  Certain medicines such as aspirin or beta-blockers.  Sulfites in foods and drinks, such as dried fruits and wine.  Infections or inflammatory conditions, such as a flu, cold, or inflammation of the nasal membranes (rhinitis).  Gastroesophageal reflux disease (GERD). GERD is a condition where stomach acid backs up into your esophagus.  Exercise or strenuous activity.  What are the signs or symptoms?  Wheezing.  Excessive coughing, particularly at night.  Chest tightness.  Shortness of breath. How is this diagnosed? Your health care provider will ask you about your medical history and perform a physical exam. A chest X-ray or blood testing may be performed to look for other causes of your symptoms or other conditions  that may have triggered your asthma attack. How is this treated? Treatment is aimed at reducing inflammation and opening up the airways in your lungs. Most asthma attacks are treated with inhaled medicines. These include quick relief or rescue medicines (such as bronchodilators) and controller medicines (such as inhaled corticosteroids). These medicines are sometimes given through an inhaler or a nebulizer. Systemic steroid medicine taken by mouth or given through an IV tube also can be used to reduce the inflammation when an attack is moderate or severe. Antibiotic medicines are only used if a bacterial infection is present. Follow these instructions at home:  Rest.  Drink plenty of liquids. This helps the mucus to remain thin and be easily coughed up. Only use caffeine in moderation and do not use alcohol until you have recovered from your illness.  Do not smoke. Avoid being exposed to secondhand smoke.  You play a critical role in keeping yourself in good health. Avoid exposure to things that cause you to wheeze or to have breathing problems.  Keep your medicines up-to-date and available. Carefully follow your health care provider's treatment plan.  Take your medicine exactly as prescribed.  When pollen or pollution is bad, keep windows closed and use an air conditioner or go to places with air conditioning.  Asthma requires careful medical care. See your health care provider for a follow-up as advised. If you are more than [redacted] weeks pregnant and you were prescribed any new medicines, let your obstetrician know about the visit and how you are doing. Follow up with your health care provider as directed.  After  you have recovered from your asthma attack, make an appointment with your outpatient doctor to talk about ways to reduce the likelihood of future attacks. If you do not have a doctor who manages your asthma, make an appointment with a primary care doctor to discuss your asthma. Get help  right away if:  You are getting worse.  You have trouble breathing. If severe, call your local emergency services (911 in the [place]  You develop chest pain or discomfort.  You are vomiting.  You are not able to keep fluids down.  You are coughing up yellow, green, brown, or bloody sputum.  You have a fever and your symptoms suddenly get worse.  You have trouble swallowing. This information is not intended to replace advice given to you by your health care provider. Make sure you discuss any questions you have with your health care provider. Document Released: 03/16/2007 Document Revised: 05/12/2016 Document Reviewed: 06/06/2013 Elsevier Interactive Patient Education  2017 [employer].

## 2018-01-05 NOTE — Assessment & Plan Note (Signed)
Patient presenting with signs and symptoms of acute asthma exacerbation. We discussed the risks/benefits of hospitalization vs outpatient management. She is reluctant to hospital admission and would prefer to do a trial of outpatient management. We discussed that she will need close outpatient follow-up to ensure she does not get worse. She agrees.   We will start a prednisone taper in addition to inhaler therapy. No acute indication for antibiotic therapy at this point. The patient does voice concerns about financial restraints. With the help of the pharmacy staff we have provided her with cash for her prescriptions and she will get her prednisone taper from the [facility].   She will follow-up in 5 days with the [facility] clinic. If symptoms have not started to improve or she experiences a decline she will likely need admitted to the hospital. We discuss symptoms that would mean she needs to go to the ED for immediate treatment.   Plan:  - Prednisone taper (50 mg x 3 days, 40 mg x 3 days, 30 mg x 3 days, 20 mg x 3 days, 10 mg x 3 days, 5 mg x 3 days) - Albuterol inhalers and nebulizer  - Follow-up in [facility] in 5 days.

## 2018-01-06 MED FILL — ALBUTEROL 0.083% INHAL SOLN: (2.5 MG/3ML | 6 days supply | Qty: 75 | Fill #0

## 2018-01-09 ENCOUNTER — Other Ambulatory Visit: Payer: Self-pay | Admitting: Pharmacist

## 2018-01-09 DIAGNOSIS — J45901 Unspecified asthma with (acute) exacerbation: Secondary | ICD-10-CM

## 2018-01-09 MED ORDER — ALBUTEROL SULFATE HFA 108 (90 BASE) MCG/ACT IN AERS: 2.0000 | INHALATION_SPRAY | RESPIRATORY_TRACT | 6 refills | Status: DC | PRN

## 2018-01-09 MED ORDER — MOMETASONE FURO-FORMOTEROL FUM 200-5 MCG/ACT IN AERO: 1.0000 | INHALATION_SPRAY | Freq: Two times a day (BID) | RESPIRATORY_TRACT | 11 refills | Status: DC

## 2018-01-09 MED ORDER — ALBUTEROL SULFATE (2.5 MG/3ML) 0.083% IN NEBU: 2.5000 mg | INHALATION_SOLUTION | Freq: Four times a day (QID) | RESPIRATORY_TRACT | 2 refills | Status: DC | PRN

## 2018-01-09 NOTE — Progress Notes (Signed)
Medicine attending: Medical history, presenting problems, physical findings, and medications, reviewed with resident physician Dr Justin Helberg on the day of the patient visit and I concur with his evaluation and management plan. 

## 2018-01-09 NOTE — Progress Notes (Signed)
Patient approved for [facility]. Inhaler prescriptions sent

## 2018-01-11 ENCOUNTER — Ambulatory Visit (HOSPITAL_COMMUNITY)
Admission: RE | Admit: 2018-01-11 | Discharge: 2018-01-11 | Disposition: A | Discharge status: Home / Self Care | Payer: Self-pay | Source: Ambulatory Visit | Attending: Internal Medicine | Admitting: Internal Medicine

## 2018-01-11 ENCOUNTER — Ambulatory Visit: Payer: Self-pay

## 2018-01-11 ENCOUNTER — Ambulatory Visit (INDEPENDENT_AMBULATORY_CARE_PROVIDER_SITE_OTHER): Payer: Self-pay | Admitting: Internal Medicine

## 2018-01-11 ENCOUNTER — Other Ambulatory Visit: Payer: Self-pay

## 2018-01-11 VITALS — BP 123/85 | HR 71 | Temp 98.2°F | Ht 62.0 in | Wt 159.0 lb

## 2018-01-11 DIAGNOSIS — J45901 Unspecified asthma with (acute) exacerbation: Secondary | ICD-10-CM | POA: Insufficient documentation

## 2018-01-11 DIAGNOSIS — Z87891 Personal history of nicotine dependence: Secondary | ICD-10-CM

## 2018-01-11 DIAGNOSIS — H8110 Benign paroxysmal vertigo, unspecified ear: Secondary | ICD-10-CM

## 2018-01-11 DIAGNOSIS — R42 Dizziness and giddiness: Secondary | ICD-10-CM

## 2018-01-11 DIAGNOSIS — K219 Gastro-esophageal reflux disease without esophagitis: Secondary | ICD-10-CM

## 2018-01-11 MED ORDER — BENZONATATE 100 MG PO CAPS: 100.0000 mg | ORAL_CAPSULE | Freq: Three times a day (TID) | ORAL | 1 refills | Status: DC | PRN

## 2018-01-11 NOTE — Progress Notes (Signed)
CC: Dyspnea, asthma   HPI:  [name] is a 57 y.o. female with a past medical history as described below presents to the clinic for follow-up of acute asthma exacerbation.  She was seen in the clinic 1/24 by her PCP with complaints of ongoing dyspnea, cough, and wheezing in the setting of multiple sick contacts and likely URI.  She was diagnosed with acute asthma exacerbation and trial of outpatient management was pursued.  A prednisone taper starting at 50 mg was started and she was instructed to continue using her albuterol inhalers and nebulizer with close follow-up.  Today, she reports persistent symptoms with cough, wheezing, dyspnea, subjective fever, and chest/abdominal soreness attributed to the cough.  She states her wheezing improves after using her albuterol nebulizer but she still has dyspnea on exertion, she has been using her nebulizer 4 times a day.  Cough is intermittently productive of green sputum.  She also reports further episodes of posttussive emesis with 2 episodes yesterday which had a foamy and black appearance similar to coffee grounds per her report.  She denies changes in bowel habits or other abdominal pain apart from muscle soreness.  She also reports brief episodes of dizziness which lasts a few seconds and occur 3-4 times a day.  She states it feels as if the room is spinning or tilting to the left or right when these occur and have led to a fall with no resulting injuries.  This is been ongoing for the last several weeks around the time she began having difficulties breathing.  Past Medical History:  Diagnosis Date  . Asthma    2 sets of PFT's in 04/09 without sign variability. Last set with significant decrease in FEV1 with saline alone, suggesting Asthma but  recommended clinical corelation   . Asthma   . Bipolar 1 disorder [facility])    therapist is Kathleen [name] and is followed by [employer]  . Blackout    negative work up including ESR, ANA,  opthalmology referral, carotid dopplers, 2D echo, MRI and EEG.  . 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 Bronchitis   . Chronic headache   . Chronic pain    normal work up including TSH, RPR, B12, HIV, plain films, 2 ESR's, ANA, CK, RF along with routine CBC, CMET and UA. Further work up includes CRP, ESR, SPEP/UPEP, hepatitis erology, A1C , repeat ANA  . DUB (dysfunctional uterine bleeding)    and pelvic pain, with negative endometrial bx in 07/09 and transvaginal U/S significant for mild fibroids in 0/09.  Marland Kitchen GERD (gastroesophageal reflux disease)   . Lower extremity edema    Neg ABI's, normal 2D echo, normal albumin  . Menorrhagia   . Ovarian cyst   . Polysubstance abuse ([place])    none since March 17,2009.  Marland Kitchen Sleep apnea    NO CPAP  . Tubular adenoma of colon    Review of Systems:  Review of Systems  Respiratory: Positive for cough, sputum production, shortness of breath and wheezing.   Gastrointestinal: Positive for vomiting.  Musculoskeletal: Positive for falls.  Neurological: Positive for dizziness.     Physical Exam:  Vitals:   01/11/18 1043  BP: 123/85  Pulse: 71  Temp: 98.2 F (36.8 C)  TempSrc: Oral  SpO2: 100%  Weight: 159 lb (72.1 kg)  Height: '5\' 2"'  (1.575 m)   General: Sitting in chair comfortably, no acute distress HEENT: Moist mucous  membranes, no pharyngeal exudate, hoarseness but no stridor CV: Regular rate and rhythm, no murmur appreciate Resp: Expiratory wheezes and coarse breath sounds diffusely, no inspiratory wheeze, normal work of breathing and able to speak in full sentences throughout exam Abd: Soft, +BS, tenderness to palpation periumbilical and epigastric, no rebound Extr: Good pulses and cap refill Neuro: Alert and oriented x3, Dix-Hallpike difficult to perform in room but attempted and did not elicit vertiginous symptoms or nystagmus Skin: Warm, dry      Assessment & Plan:   See Encounters  Tab for problem based charting.  Patient discussed with Dr. Lynnae January

## 2018-01-11 NOTE — Assessment & Plan Note (Signed)
She is experiencing brief episodes of vertigo most consistent with BPPV as an underlying cause, no symptoms elicited with Dix-Hallpike today, however maneuver was difficult to properly perform in clinic room.  Her symptoms have led to a fall without resulting injury and feeling unsteady.  She was advised to take great care with getting up from seated positions or changing posture and use structures or assistive devices as available to prevent falls or other injury.  We will place referral for vestibular PT  --Vestibular PT

## 2018-01-11 NOTE — Assessment & Plan Note (Addendum)
Presenting for follow-up of acute asthma exacerbation currently being treated with steroid taper and albuterol inhaler and nebulizers as needed which has occurred in the setting of a respiratory infection, likely viral.  She has had persistent symptoms with continued wheezing, dyspnea, cough.  Based on description from prior clinic note, her exam appears improved, still with expiratory wheezing and coarse breath sounds but no respiratory distress and is able to speak in full sentences, 100% on room air.  Though her response may be somewhat slow, she may need more time with the steroid taper and breathing treatments.  She did have a chest x-ray on 1/16 with a questionable right lobe infiltrate.  We will repeat a chest x-ray to ensure she has not developed a post viral pneumonia that is contributing to her persistent symptoms, though she is afebrile today.  For symptomatic treatment of her cough, she is provided a prescription for [employer] and a good Rx card (average cost $6-$10) and a sample of Robitussin-DM.  --Cont steroid taper, albuterol breathing treatments --CXR --Symptomatic tx with Robitussin DM, Tessalon perles

## 2018-01-11 NOTE — Patient Instructions (Addendum)
Nice to meet you today [name].  For your asthma, continue using your albuterol nebulizers and inhaler to help with your wheezing breathing.  We are going to get a chest x-ray today to make sure you have not developed a more serious infection after having this virus.  We also provided a prescription for a cough medicine which should help your cough and allow you to breathe easier and decrease your chest and abdominal soreness.  We would like to see you back in 1 week if you are still not getting better.  If your symptoms do get better  you can cancel this appointment as you have a follow-up appointment with Dr. Tarri Abernethy scheduled for February 21.  For your dizziness, be sure to be very careful especially when getting up or moving around to decrease the chance that you fall.  This may be due to a problem with your inner ear that is controlled balance.  We put in a referral for a special type of therapy called vestibular therapy that can help treat this issue.  We are also going to check a lab to look at your blood counts

## 2018-01-11 NOTE — Assessment & Plan Note (Signed)
She has a history of GERD and is now reporting posttussive emesis with potential coffee-ground appearance.  She has muscular soreness of her abdominal musculature but no other abdominal symptoms at this time.  She is advised to monitor for further episodes or worsening, will check a CBC today.

## 2018-01-12 LAB — CBC
HEMOGLOBIN: 13.3 g/dL (ref 11.1–15.9)
Hematocrit: 41.1 % (ref 34.0–46.6)
MCH: 27.9 pg (ref 26.6–33.0)
MCHC: 32.4 g/dL (ref 31.5–35.7)
MCV: 86 fL (ref 79–97)
Platelets: 346 10*3/uL (ref 150–379)
RBC: 4.76 x10E6/uL (ref 3.77–5.28)
RDW: 14.2 % (ref 12.3–15.4)
WBC: 8.9 10*3/uL (ref 3.4–10.8)

## 2018-01-12 NOTE — Progress Notes (Signed)
Internal Medicine Clinic Attending  Case discussed with Dr. Harden at the time of the visit.  We reviewed the resident's history and exam and pertinent patient test results.  I agree with the assessment, diagnosis, and plan of care documented in the resident's note.  

## 2018-01-18 ENCOUNTER — Ambulatory Visit: Payer: Self-pay

## 2018-02-02 ENCOUNTER — Encounter: Payer: Self-pay | Admitting: Internal Medicine

## 2018-02-09 ENCOUNTER — Encounter: Payer: Self-pay | Admitting: Internal Medicine

## 2018-02-09 NOTE — Progress Notes (Deleted)
   CC: ***  HPI:  [name] is a 57 y.o.   Past Medical History:  Diagnosis Date  . Asthma    2 sets of PFT's in 04/09 without sign variability. Last set with significant decrease in FEV1 with saline alone, suggesting Asthma but  recommended clinical corelation   . Asthma   . Bipolar 1 disorder [facility])    therapist is Kathleen [name] and is followed by [employer]  . Blackout    negative work up including ESR, ANA, opthalmology referral, carotid dopplers, 2D echo, MRI and EEG.  . 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 Bronchitis   . Chronic headache   . Chronic pain    normal work up including TSH, RPR, B12, HIV, plain films, 2 ESR's, ANA, CK, RF along with routine CBC, CMET and UA. Further work up includes CRP, ESR, SPEP/UPEP, hepatitis erology, A1C , repeat ANA  . DUB (dysfunctional uterine bleeding)    and pelvic pain, with negative endometrial bx in 07/09 and transvaginal U/S significant for mild fibroids in 0/09.  Marland Kitchen GERD (gastroesophageal reflux disease)   . Lower extremity edema    Neg ABI's, normal 2D echo, normal albumin  . Menorrhagia   . Ovarian cyst   . Polysubstance abuse ([place])    none since March 17,2009.  Marland Kitchen Sleep apnea    NO CPAP  . Tubular adenoma of colon    Review of Systems:   12 point ROS preformed. All negative aside from those mentioned in the HPI.  Physical Exam: There were no vitals filed for this visit. General: Well nourished female in no acute distress HENT: Normocephalic, atraumatic, moist mucus membranes Pulm: Good air movement with no wheezing or crackles  CV: RRR, no murmurs, no rubs  Abdomen: Active bowel sounds, soft, non-distended, no tenderness to palpation  Extremities: Pulses palpable in all extremities, no LE edema  Skin: Warm and dry  Neuro: Alert and oriented x 3  Assessment & Plan:   See Encounters Tab for problem based charting.  Patient {GC/GE:3044014::"discussed  with","seen with"} Dr. {NAMES:[id number]::"Butcher","Granfortuna","E. Hoffman","Klima","Mullen","Narendra","Raines","Vincent"}

## 2018-02-20 ENCOUNTER — Encounter: Payer: Self-pay | Admitting: Internal Medicine

## 2018-02-28 ENCOUNTER — Other Ambulatory Visit: Payer: Self-pay

## 2018-02-28 ENCOUNTER — Encounter: Payer: Self-pay | Admitting: Internal Medicine

## 2018-02-28 ENCOUNTER — Ambulatory Visit (INDEPENDENT_AMBULATORY_CARE_PROVIDER_SITE_OTHER): Payer: Self-pay | Admitting: Internal Medicine

## 2018-02-28 ENCOUNTER — Encounter (INDEPENDENT_AMBULATORY_CARE_PROVIDER_SITE_OTHER): Payer: Self-pay

## 2018-02-28 VITALS — BP 126/87 | HR 81 | Temp 98.8°F | Wt 161.4 lb

## 2018-02-28 DIAGNOSIS — J45901 Unspecified asthma with (acute) exacerbation: Secondary | ICD-10-CM

## 2018-02-28 DIAGNOSIS — Z7951 Long term (current) use of inhaled steroids: Secondary | ICD-10-CM

## 2018-02-28 DIAGNOSIS — Z760 Encounter for issue of repeat prescription: Secondary | ICD-10-CM

## 2018-02-28 DIAGNOSIS — K219 Gastro-esophageal reflux disease without esophagitis: Secondary | ICD-10-CM

## 2018-02-28 DIAGNOSIS — Z1239 Encounter for other screening for malignant neoplasm of breast: Secondary | ICD-10-CM

## 2018-02-28 DIAGNOSIS — Z79899 Other long term (current) drug therapy: Secondary | ICD-10-CM

## 2018-02-28 DIAGNOSIS — Z91030 Bee allergy status: Secondary | ICD-10-CM

## 2018-02-28 DIAGNOSIS — K0889 Other specified disorders of teeth and supporting structures: Secondary | ICD-10-CM

## 2018-02-28 DIAGNOSIS — M549 Dorsalgia, unspecified: Secondary | ICD-10-CM

## 2018-02-28 MED ORDER — EPINEPHRINE 0.3 MG/0.3ML IJ SOAJ: 0.3000 mg | Freq: Once | INTRAMUSCULAR | 2 refills | Status: AC

## 2018-02-28 MED ORDER — ALBUTEROL SULFATE HFA 108 (90 BASE) MCG/ACT IN AERS: 2.0000 | INHALATION_SPRAY | RESPIRATORY_TRACT | 6 refills | Status: DC | PRN

## 2018-02-28 MED ORDER — FEXOFENADINE HCL 180 MG PO TABS: 180.0000 mg | ORAL_TABLET | Freq: Every day | ORAL | 2 refills | Status: DC

## 2018-02-28 MED ORDER — NAPROXEN 375 MG PO TABS: 375.0000 mg | ORAL_TABLET | Freq: Two times a day (BID) | ORAL | 2 refills | Status: DC

## 2018-02-28 MED ORDER — OMEPRAZOLE 20 MG PO CPDR: 20.0000 mg | DELAYED_RELEASE_CAPSULE | Freq: Every day | ORAL | 3 refills | Status: DC

## 2018-02-28 MED ORDER — ALBUTEROL SULFATE (2.5 MG/3ML) 0.083% IN NEBU: 2.5000 mg | INHALATION_SOLUTION | Freq: Four times a day (QID) | RESPIRATORY_TRACT | 2 refills | Status: DC | PRN

## 2018-02-28 MED ORDER — MOMETASONE FURO-FORMOTEROL FUM 200-5 MCG/ACT IN AERO: 1.0000 | INHALATION_SPRAY | Freq: Two times a day (BID) | RESPIRATORY_TRACT | 11 refills | Status: DC

## 2018-02-28 NOTE — Progress Notes (Signed)
   CC: For medication refill and dental pain.  HPI:  [name] is a 57 y.o. with past medical history as listed below came to the clinic to get his medication refill which includes her Kathleen [name] and albuterol inhaler and albuterol nebulizer solution, apparently refills were ordered by Dr. Tarri Abernethy in January with sufficient refills but patient wants it to resent it to Kathleen [name] order pharmacy.  She was also asking for a naproxen refill as she was using it for her back pain.  She was also requesting an EpiPen, she was seen in ED in August 2018 due to early anaphylaxis secondary to bee sting.  She was worried that spring is approaching and she do not want to go through with all that again.  She was complaining of some loose teeth which cause difficulty with chewing and was inquiring about dental cleaning.  She denies any active pain or swelling.  Please see assessment and plan for her chronic conditions.  Past Medical History:  Diagnosis Date  . Asthma    2 sets of PFT's in 04/09 without sign variability. Last set with significant decrease in FEV1 with saline alone, suggesting Asthma but  recommended clinical corelation   . Asthma   . Bipolar 1 disorder Kathleen [name])    therapist is Caryl Asp and is followed by Kathleen [name]  . Blackout    negative work up including ESR, ANA, opthalmology referral, carotid dopplers, 2D echo, MRI and EEG.  . 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 Bronchitis   . Chronic headache   . Chronic pain    normal work up including TSH, RPR, B12, HIV, plain films, 2 ESR's, ANA, CK, RF along with routine CBC, CMET and UA. Further work up includes CRP, ESR, SPEP/UPEP, hepatitis erology, A1C , repeat ANA  . DUB (dysfunctional uterine bleeding)    and pelvic pain, with negative endometrial bx in 07/09 and transvaginal U/S significant for mild fibroids in 0/09.  Kathleen [name] GERD (gastroesophageal reflux disease)   . Lower  extremity edema    Neg ABI's, normal 2D echo, normal albumin  . Menorrhagia   . Ovarian cyst   . Polysubstance abuse (Kathleen [name])    none since March 17,2009.  Kathleen [name] Sleep apnea    NO CPAP  . Tubular adenoma of colon    Review of Systems: Negative except mentioned in HPI.  Physical Exam:  Vitals:   02/28/18 1026  BP: 126/87  Pulse: 81  Temp: 98.8 F (37.1 C)  TempSrc: Oral  SpO2: 98%  Weight: 161 lb 6.4 oz (73.2 kg)    General: Vital signs reviewed.  Patient is well-developed and well-nourished, in no acute distress and cooperative with exam.  Head: Normocephalic and atraumatic. Eyes: EOMI, conjunctivae normal, no scleral icterus.  Cardiovascular: RRR, S1 normal, S2 normal, no murmurs, gallops, or rubs. Pulmonary/Chest: Bilateral few scattered wheeze. Abdominal: Soft, non-tender, non-distended, BS +, no masses, organomegaly, or guarding present.  Extremities: No lower extremity edema bilaterally,  pulses symmetric and intact bilaterally. No cyanosis or clubbing. Skin: Warm, dry and intact. No rashes or erythema. Psychiatric: Normal mood and affect. speech and behavior is normal. Cognition and memory are normal.  Assessment & Plan:   See Encounters Tab for problem based charting.  Patient discussed with Dr. Lynnae January.

## 2018-02-28 NOTE — Patient Instructions (Addendum)
Thank you for visiting clinic today. Please schedule your appointment with your PCP within next 2-58-month for your health maintenance. I did give you the refills for your asthma medication and EpiPen. I am also sending a referral for mammogram, please schedule your appointment at your convenience. I am also starting you on a new allergy medicine called Allegra, please let your PCP know during next follow-up visit how it works.

## 2018-02-28 NOTE — Assessment & Plan Note (Signed)
Information about free dental clinic was provided.

## 2018-02-28 NOTE — Assessment & Plan Note (Signed)
Referral for mammogram was placed.

## 2018-02-28 NOTE — Assessment & Plan Note (Signed)
Her symptoms are well controlled on omeprazole.  A refill was provided.

## 2018-02-28 NOTE — Assessment & Plan Note (Addendum)
Patient denies any acute exacerbation.  She continued to use her albuterol inhaler 3-4 times daily along with [facility] twice daily.  She also use albuterol nebulizer many times during the week. She was complaining of itchy and watery eyes and some sinus congestion whenever she goes outside.  She has uncontrolled asthma, as needing her rescue inhaler multiple times daily. Her allergies might be playing a role.  Started her on Allegra 180 mg daily. She was provided with refills for her [facility] and albuterol inhaler.  She was also provided with a refill for albuterol nebulizer solution which she uses with worsening of her symptoms unable to control with albuterol inhaler. She was also provided with prescription for EpiPen because of her history of early anaphylaxis secondary to bee sting. He was asked to follow-up with PCP within 2 month.

## 2018-03-01 NOTE — Progress Notes (Signed)
Internal Medicine Clinic Attending  Case discussed with Dr. Reesa Chew at the time of the visit.  We reviewed the resident's history and exam and pertinent patient test results.  I agree with the assessment, diagnosis, and plan of care documented in the resident's note. Freq use of albuterol indicates poor technique, incorrect dx, or suboptimal tx regimen. PCP appt to address.

## 2018-03-30 ENCOUNTER — Telehealth: Payer: Self-pay | Admitting: Internal Medicine

## 2018-03-30 DIAGNOSIS — N63 Unspecified lump in unspecified breast: Secondary | ICD-10-CM

## 2018-03-30 NOTE — Telephone Encounter (Signed)
Spoke with Brittney at the [facility].   Confirmed order that new to be placed are as follows: a Bilateral Diagnostic Mammogram,Left Breast Ultrasound and a Right Breast Ultrasound.  Once it is placed they will go ahead and call the patient and get her urgently scheduled.

## 2018-03-30 NOTE — Telephone Encounter (Signed)
Orders placed. Thank you.

## 2018-03-31 ENCOUNTER — Other Ambulatory Visit: Payer: Self-pay | Admitting: Internal Medicine

## 2018-03-31 DIAGNOSIS — N63 Unspecified lump in unspecified breast: Secondary | ICD-10-CM

## 2018-04-04 ENCOUNTER — Ambulatory Visit (INDEPENDENT_AMBULATORY_CARE_PROVIDER_SITE_OTHER): Payer: Self-pay | Admitting: Internal Medicine

## 2018-04-04 ENCOUNTER — Encounter (INDEPENDENT_AMBULATORY_CARE_PROVIDER_SITE_OTHER): Payer: Self-pay

## 2018-04-04 ENCOUNTER — Other Ambulatory Visit: Payer: Self-pay

## 2018-04-04 DIAGNOSIS — M549 Dorsalgia, unspecified: Secondary | ICD-10-CM

## 2018-04-04 DIAGNOSIS — Z791 Long term (current) use of non-steroidal anti-inflammatories (NSAID): Secondary | ICD-10-CM

## 2018-04-04 DIAGNOSIS — J45909 Unspecified asthma, uncomplicated: Secondary | ICD-10-CM

## 2018-04-04 DIAGNOSIS — F339 Major depressive disorder, recurrent, unspecified: Secondary | ICD-10-CM

## 2018-04-04 DIAGNOSIS — F32A Depression, unspecified: Secondary | ICD-10-CM

## 2018-04-04 DIAGNOSIS — J45901 Unspecified asthma with (acute) exacerbation: Secondary | ICD-10-CM

## 2018-04-04 DIAGNOSIS — F329 Major depressive disorder, single episode, unspecified: Secondary | ICD-10-CM

## 2018-04-04 DIAGNOSIS — G8929 Other chronic pain: Secondary | ICD-10-CM

## 2018-04-04 DIAGNOSIS — N6310 Unspecified lump in the right breast, unspecified quadrant: Secondary | ICD-10-CM

## 2018-04-04 DIAGNOSIS — Z7951 Long term (current) use of inhaled steroids: Secondary | ICD-10-CM

## 2018-04-04 DIAGNOSIS — N920 Excessive and frequent menstruation with regular cycle: Secondary | ICD-10-CM

## 2018-04-04 DIAGNOSIS — R51 Headache: Secondary | ICD-10-CM

## 2018-04-04 DIAGNOSIS — N6320 Unspecified lump in the left breast, unspecified quadrant: Secondary | ICD-10-CM

## 2018-04-04 DIAGNOSIS — H53123 Transient visual loss, bilateral: Secondary | ICD-10-CM

## 2018-04-04 DIAGNOSIS — Z9181 History of falling: Secondary | ICD-10-CM

## 2018-04-04 DIAGNOSIS — Z79899 Other long term (current) drug therapy: Secondary | ICD-10-CM

## 2018-04-04 DIAGNOSIS — N63 Unspecified lump in unspecified breast: Secondary | ICD-10-CM

## 2018-04-04 DIAGNOSIS — R45850 Homicidal ideations: Secondary | ICD-10-CM

## 2018-04-04 DIAGNOSIS — Z8781 Personal history of (healed) traumatic fracture: Secondary | ICD-10-CM

## 2018-04-04 MED ORDER — MOMETASONE FURO-FORMOTEROL FUM 200-5 MCG/ACT IN AERO: 1.0000 | INHALATION_SPRAY | Freq: Two times a day (BID) | RESPIRATORY_TRACT | 11 refills | Status: DC

## 2018-04-04 MED ORDER — ALBUTEROL SULFATE HFA 108 (90 BASE) MCG/ACT IN AERS: 2.0000 | INHALATION_SPRAY | RESPIRATORY_TRACT | 6 refills | Status: DC | PRN

## 2018-04-04 MED ORDER — ALBUTEROL SULFATE (2.5 MG/3ML) 0.083% IN NEBU: 2.5000 mg | INHALATION_SOLUTION | Freq: Four times a day (QID) | RESPIRATORY_TRACT | 2 refills | Status: DC | PRN

## 2018-04-04 MED ORDER — SERTRALINE HCL 50 MG PO TABS: 50.0000 mg | ORAL_TABLET | Freq: Every day | ORAL | 1 refills | Status: DC

## 2018-04-04 MED ORDER — LORATADINE 10 MG PO TABS: 10.0000 mg | ORAL_TABLET | Freq: Every day | ORAL | 0 refills | Status: DC

## 2018-04-04 MED ORDER — ASPIRIN EC 81 MG PO TBEC: 81.0000 mg | DELAYED_RELEASE_TABLET | Freq: Every day | ORAL | 1 refills | Status: DC

## 2018-04-04 NOTE — Assessment & Plan Note (Signed)
Assessment PHQ9 of 27 today. Denies SI. She reports wanting to "take a gun to my brothers because they are evicting me", however she states that she is unlikely to follow through with this plan and that her children and God are keeping her from actually following through. She has no history of suicide attempts. She has previously been on zoloft and celexa. Is not sure whether she has been on Lithium in the past, although states that she carries a diagnosis of bipolar disorder.  I suspect that her underlying depression is causing many of her other symptoms, including worsened headache, back pain, and potentially recent falls. She does endorse HI, however on further questioning, she seems unlikely to follow through on this plan and does not pose an acute risk requiring inpatient eval. She also carries a diagnosis of bipolar disorder, however I am not sure whether this has actually been diagnosed. Given her significant depressive symptoms today, I think it is reasonable to start her on an SSRI today with low likelihood of unmasking manic episode as she has previously tolerated this well and re-evaluate.  Plan - Start sertraline 50mg  daily, can uptitrate as needed - Encouraged to follow up at [facility] (she stated she would go tomorrow) - F/u in 4-6 weeks

## 2018-04-04 NOTE — Assessment & Plan Note (Signed)
Assessment Reports 3 lumps in right breast and 1 on left breast. Mammography was ordered, however this has not yet been scheduled  Plan - Discussed with nursing staff who have scheduled her for the mammogram

## 2018-04-04 NOTE — Progress Notes (Signed)
CC: management of depression  HPI:  Kathleen [name] is a 57 y.o. female with PMH of menorrhagia and depression who presents for management of depression.  She comes in stating that she needs to restart Zoloft.  She has previously been on sertraline and is unsure whether this helped.  She is also been on Celexa in the past and cannot remember if this was helpful.  She tells me she has a diagnosis of bipolar, she has never been on lithium before.  She is also seen [facility] in the past, however it has been several years since she has seen them.  PHQ9 of 27 today.  She denies SI, however states that "I want to take a gun to my brothers because they are evicting me from our family home".  Does have access to firearms at home.  She reports that she is unlikely to follow through with this and that God and her children are keeping her from following through.  She denies history of suicide attempts.   She also endorses falling daily as well as episodes of transient bilateral vision loss.  These episodes of vision loss have been occurring since 2008 and occur approximately 5 times a week.  She stopped driving because of these episodes.  She has had extensive workup for this issue without an etiology.  She also endorses a headache recently.  She endorses chronic back pain and states that she fell on her tailbone and broke it several years ago.  She requests oxycodone.  She states that she is currently taking naproxen and it does not help.  She does not smoke.  She drinks alcohol occasionally.  She used to smoke marijuana however has not been using this recently because she has not been feeling well.  She has a history of crack cocaine use but is not use this in many years.  Please see the assessment and plan below for the status of the patient's chronic medical problems.  Past Medical History:  Diagnosis Date  . Asthma    2 sets of PFT's in 04/09 without sign variability. Last set with significant  decrease in FEV1 with saline alone, suggesting Asthma but  recommended clinical corelation   . Asthma   . Bipolar 1 disorder [facility])    therapist is Caryl Asp and is followed by [employer]  . Blackout    negative work up including ESR, ANA, opthalmology referral, carotid dopplers, 2D echo, MRI and EEG.  . 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 Bronchitis   . Chronic headache   . Chronic pain    normal work up including TSH, RPR, B12, HIV, plain films, 2 ESR's, ANA, CK, RF along with routine CBC, CMET and UA. Further work up includes CRP, ESR, SPEP/UPEP, hepatitis erology, A1C , repeat ANA  . DUB (dysfunctional uterine bleeding)    and pelvic pain, with negative endometrial bx in 07/09 and transvaginal U/S significant for mild fibroids in 0/09.  Marland Kitchen GERD (gastroesophageal reflux disease)   . Lower extremity edema    Neg ABI's, normal 2D echo, normal albumin  . Menorrhagia   . Ovarian cyst   . Polysubstance abuse ([place])    none since March 17,2009.  Marland Kitchen Sleep apnea    NO CPAP  . Tubular adenoma of colon    Review of Systems:   GEN: Neg for fevers NEURO: Positive for headaches, episodes of transient vision loss CV: Neg  for chest pain PULM: Neg for shortness of breath ABD: Neg for abdominal pain BACK: Positive for back pain PSYCH: Denies SI, does endorse wanting to kill brother but states she is unlikely to follow through with this  Physical Exam:  Vitals:   04/04/18 1446  BP: 138/83  Pulse: 72  Temp: 98.3 F (36.8 C)  TempSrc: Oral  SpO2: 99%  Weight: 160 lb 3.2 oz (72.7 kg)  Height: _0  (1.575 m)   GEN: Sitting in chair in NAD CV: NR & RR, no m/r/g PULM: CTAB, no wheezes or rales ABD: Soft, NT, ND, +BS BACK: Tender to palpation in lower back MSK: No LE edema PSYCH: Intermittently tearful, occasionally tangential thought.  Assessment & Plan:   See Encounters Tab for problem based charting.  Patient discussed with  Dr. Evette Doffing

## 2018-04-04 NOTE — Assessment & Plan Note (Signed)
Assessment Has been out of inhaler and nebulizers recently. States she uses albuterol inhaler 3-4 times daily and reports compliance with other medications. Lung exam normal today. No evidence of acute exacerbation.  Plan - Refilled claritin - Refilled albuterol inhaler, albuterol nebulizer, and Dulera

## 2018-04-04 NOTE — Patient Instructions (Signed)
FOLLOW-UP INSTRUCTIONS When: 4-6 weeks For: follow-up of depression What to bring: medications   Kathleen [name],  It was good to meet you today.  For your depression, please start taking zoloft 50mg  once a day. Please also go see the walk in clinic at [facility].  I have sent a refill of your asthma medicines.  Please come back to see [place] in about 4-6 weeks for follow-up of your depression.

## 2018-04-05 ENCOUNTER — Telehealth: Payer: Self-pay | Admitting: *Deleted

## 2018-04-05 NOTE — Progress Notes (Signed)
Internal Medicine Clinic Attending  Case discussed with Dr. Huang at the time of the visit.  We reviewed the resident's history and exam and pertinent patient test results.  I agree with the assessment, diagnosis, and plan of care documented in the resident's note. 

## 2018-04-05 NOTE — Telephone Encounter (Signed)
CALLED PATIENT LVM FOR PATIENT TO LET HER KNOW THAT THE INFORMATION REQUESTED IS READY FOR PICK UP/ BRING PICTURE ID / PACKET WILL BE AT  THE FRONT DESK IN CABINET.

## 2018-04-11 ENCOUNTER — Encounter: Payer: Self-pay | Admitting: Internal Medicine

## 2018-04-24 ENCOUNTER — Other Ambulatory Visit (HOSPITAL_COMMUNITY): Payer: Self-pay | Admitting: *Deleted

## 2018-04-24 DIAGNOSIS — N6320 Unspecified lump in the left breast, unspecified quadrant: Secondary | ICD-10-CM

## 2018-04-24 DIAGNOSIS — N6310 Unspecified lump in the right breast, unspecified quadrant: Secondary | ICD-10-CM

## 2018-05-03 ENCOUNTER — Emergency Department (HOSPITAL_COMMUNITY): Payer: Self-pay

## 2018-05-03 ENCOUNTER — Other Ambulatory Visit: Payer: Self-pay

## 2018-05-03 ENCOUNTER — Emergency Department (HOSPITAL_COMMUNITY)
Admission: EM | Admit: 2018-05-03 | Discharge: 2018-05-03 | Disposition: A | Discharge status: Home / Self Care | Payer: Self-pay | Attending: Emergency Medicine | Admitting: Emergency Medicine

## 2018-05-03 ENCOUNTER — Encounter (HOSPITAL_COMMUNITY): Payer: Self-pay | Admitting: Emergency Medicine

## 2018-05-03 DIAGNOSIS — Z87891 Personal history of nicotine dependence: Secondary | ICD-10-CM | POA: Insufficient documentation

## 2018-05-03 DIAGNOSIS — Z7982 Long term (current) use of aspirin: Secondary | ICD-10-CM | POA: Insufficient documentation

## 2018-05-03 DIAGNOSIS — J4531 Mild persistent asthma with (acute) exacerbation: Secondary | ICD-10-CM | POA: Insufficient documentation

## 2018-05-03 DIAGNOSIS — Z79899 Other long term (current) drug therapy: Secondary | ICD-10-CM | POA: Insufficient documentation

## 2018-05-03 DIAGNOSIS — J4521 Mild intermittent asthma with (acute) exacerbation: Secondary | ICD-10-CM

## 2018-05-03 LAB — BASIC METABOLIC PANEL
Anion gap: 9 (ref 5–15)
BUN: 9 mg/dL (ref 6–20)
CALCIUM: 9.1 mg/dL (ref 8.9–10.3)
CHLORIDE: 106 mmol/L (ref 101–111)
CO2: 25 mmol/L (ref 22–32)
Creatinine, Ser: 1.06 mg/dL — ABNORMAL HIGH (ref 0.44–1.00)
GFR calc Af Amer: >60 mL/min (ref >60)
GFR, EST NON AFRICAN AMERICAN: 57 mL/min — AB (ref >60)
GLUCOSE: 84 mg/dL (ref 65–99)
POTASSIUM: 3.9 mmol/L (ref 3.5–5.1)
SODIUM: 140 mmol/L (ref 135–145)

## 2018-05-03 LAB — CBC WITH DIFFERENTIAL/PLATELET
BASOS PCT: 0 %
Basophils Absolute: 0 10*3/uL (ref 0.0–0.1)
EOS ABS: 1.5 10*3/uL — AB (ref 0.0–0.7)
EOS PCT: 18 %
HCT: 41.9 % (ref 36.0–46.0)
Hemoglobin: 13.7 g/dL (ref 12.0–15.0)
Lymphocytes Relative: 25 %
Lymphs Abs: 2 10*3/uL (ref 0.7–4.0)
MCH: 28 pg (ref 26.0–34.0)
MCHC: 32.7 g/dL (ref 30.0–36.0)
MCV: 85.7 fL (ref 78.0–100.0)
MONO ABS: 0.5 10*3/uL (ref 0.1–1.0)
Monocytes Relative: 6 %
Neutro Abs: 4 10*3/uL (ref 1.7–7.7)
Neutrophils Relative %: 51 %
PLATELETS: 317 10*3/uL (ref 150–400)
RBC: 4.89 MIL/uL (ref 3.87–5.11)
RDW: 13.9 % (ref 11.5–15.5)
WBC: 8 10*3/uL (ref 4.0–10.5)

## 2018-05-03 LAB — I-STAT TROPONIN, ED: TROPONIN I, POC: 0.01 ng/mL (ref 0.00–0.08)

## 2018-05-03 LAB — D-DIMER, QUANTITATIVE (NOT AT ARMC): D DIMER QUANT: 0.3 ug{FEU}/mL (ref 0.00–0.50)

## 2018-05-03 MED ORDER — IPRATROPIUM-ALBUTEROL 0.5-2.5 (3) MG/3ML IN SOLN
3.0000 mL | Freq: Once | RESPIRATORY_TRACT | Status: AC
  Administered 2018-05-03: 3 mL via RESPIRATORY_TRACT
  Filled 2018-05-03: qty 3

## 2018-05-03 MED ORDER — ALBUTEROL SULFATE (2.5 MG/3ML) 0.083% IN NEBU
5.0000 mg | INHALATION_SOLUTION | Freq: Once | RESPIRATORY_TRACT | Status: AC
  Administered 2018-05-03: 5 mg via RESPIRATORY_TRACT
  Filled 2018-05-03: qty 6

## 2018-05-03 MED ORDER — DEXAMETHASONE SODIUM PHOSPHATE 10 MG/ML IJ SOLN
10.0000 mg | Freq: Once | INTRAMUSCULAR | Status: DC
  Filled 2018-05-03: qty 1

## 2018-05-03 MED ORDER — ALBUTEROL SULFATE HFA 108 (90 BASE) MCG/ACT IN AERS
1.0000 | INHALATION_SPRAY | Freq: Once | RESPIRATORY_TRACT | Status: DC
  Filled 2018-05-03: qty 6.7

## 2018-05-03 MED ORDER — DEXAMETHASONE SODIUM PHOSPHATE 10 MG/ML IJ SOLN
10.0000 mg | Freq: Once | INTRAMUSCULAR | Status: AC
  Administered 2018-05-03: 10 mg via INTRAVENOUS

## 2018-05-03 MED ORDER — BENZONATATE 100 MG PO CAPS: 200.0000 mg | ORAL_CAPSULE | Freq: Three times a day (TID) | ORAL | 0 refills | Status: DC

## 2018-05-03 MED ORDER — SODIUM CHLORIDE 0.9 % IV BOLUS
1000.0000 mL | Freq: Once | INTRAVENOUS | Status: AC
  Administered 2018-05-03: 1000 mL via INTRAVENOUS

## 2018-05-03 NOTE — ED Notes (Signed)
EDPA Provider at bedside. 

## 2018-05-03 NOTE — ED Triage Notes (Signed)
Patient is complaining of sob about 3 hours ago. Patient states that she already had a cough. Patient is having a non productive cough.

## 2018-05-03 NOTE — ED Notes (Signed)
INHALER AND RX X1 GIVEN

## 2018-05-03 NOTE — Discharge Instructions (Signed)
Take Tessalon Perles as needed for cough.  Use your albuterol inhaler as needed.

## 2018-05-03 NOTE — ED Provider Notes (Signed)
[facility] DEPT Provider Note   CSN: [id number] Arrival date & time: 05/03/18  0814     History   Chief Complaint Chief Complaint  Patient presents with  . Shortness of Breath    HPI Kathleen [name] is a 57 y.o. female past medical history of asthma, who presents to ED for evaluation of 3-hour history of onset chest pain, shortness of breath, wheezing, cough.  She states that symptoms began suddenly when she was sleeping.  She denies any prior history of similar symptoms.  Patient is a very poor historian so remainder of history is limited.  She tells me she has not felt this pain before but then states that it feels similar to her prior asthma exacerbations.  She states that she does not have her inhalers at home.  Is unsure how long the cough is been going on but reports "watery" mucus.  Denies any hemoptysis, recent surgeries, recent prolonged travel, leg swelling, hormone use, history of cancer, prior MI, DVT or PE.  Denies any alcohol, tobacco or other drug use.  HPI  Past Medical History:  Diagnosis Date  . Asthma    2 sets of PFT's in 04/09 without sign variability. Last set with significant decrease in FEV1 with saline alone, suggesting Asthma but  recommended clinical corelation   . Asthma   . Bipolar 1 disorder [facility])    therapist is Caryl Asp and is followed by [employer]  . Blackout    negative work up including ESR, ANA, opthalmology referral, carotid dopplers, 2D echo, MRI and EEG.  . 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 Bronchitis   . Chronic headache   . Chronic pain    normal work up including TSH, RPR, B12, HIV, plain films, 2 ESR's, ANA, CK, RF along with routine CBC, CMET and UA. Further work up includes CRP, ESR, SPEP/UPEP, hepatitis erology, A1C , repeat ANA  . DUB (dysfunctional uterine bleeding)    and pelvic pain, with negative endometrial bx in 07/09 and transvaginal U/S  significant for mild fibroids in 0/09.  Marland Kitchen GERD (gastroesophageal reflux disease)   . Lower extremity edema    Neg ABI's, normal 2D echo, normal albumin  . Menorrhagia   . Ovarian cyst   . Polysubstance abuse ([place])    none since March 17,2009.  Marland Kitchen Sleep apnea    NO CPAP  . Tubular adenoma of colon     Patient Active Problem List   Diagnosis Date Noted  . Vertigo 01/11/2018  . Odynophagia 08/10/2016  . Partial Achilles tendon tear 08/10/2016  . Lumbar radiculopathy 07/26/2016  . Wound infection 07/20/2016  . Tooth loose 03/24/2016  . Left hip pain 02/05/2016  . Depression 09/19/2015  . Hot flashes 02/05/2015  . Flashing lights seen 02/05/2015  . Abnormal uterine bleeding 07/24/2014  . Left knee pain 06/18/2014  . Screening for breast cancer 06/18/2014  . Menorrhagia 05/24/2014  . Dyspareunia 05/24/2014  . Breast lump in female and tenderness 05/24/2014  . Protein-calorie malnutrition, severe ([place]) 05/15/2014  . Asthma 05/13/2014  . Neck pain on left side 01/19/2012  . DVT 12/21/2010  . ABNORMAL VAGINAL BLEEDING 12/21/2010  . BIPOLAR DISORDER UNSPECIFIED 01/21/2010  . GERD 01/21/2010  . ARTHRITIS, GENERALIZED 10/08/2008  . SNORING 10/08/2008  . DISTURBANCE OF SKIN SENSATION 08/29/2008  . Chronic low back pain 08/08/2008  . INSOMNIA 05/31/2008  . VISION LOSS, SUDDEN 03/25/2008  .  BREAST LUMP 03/25/2008  . LEG PAIN, BILATERAL 03/19/2008  . HALLUCINATIONS 03/18/2008  . GENERALIZED ANXIETY DISORDER 03/14/2008  . DRUG ABUSE 03/14/2008  . MIGRAINE VARIANT 03/14/2008    Past Surgical History:  Procedure Laterality Date  . CESAREAN SECTION    . CESAREAN SECTION    . HERNIA REPAIR    . Left partial oophorectomy    . OOPHORECTOMY     1/2 ovary removed  . VAGINA SURGERY     mesh     OB History    Gravida  3   Para  2   Term  2   Preterm  0   AB  1   Living        SAB  0   TAB  1   Ectopic  0   Multiple      Live Births               Home  Medications    Prior to Admission medications   Medication Sig Start Date End Date Taking? Authorizing Provider  albuterol (PROVENTIL HFA;VENTOLIN HFA) 108 (90 Base) MCG/ACT inhaler Inhale 2 puffs into the lungs every 4 (four) hours as needed for wheezing or shortness of breath. 04/04/18  Yes Colbert Ewing, MD  albuterol (PROVENTIL) (2.5 MG/3ML) 0.083% nebulizer solution Take 3 mLs (2.5 mg total) by nebulization every 6 (six) hours as needed for wheezing or shortness of breath. 04/04/18  Yes Colbert Ewing, MD  aspirin EC 81 MG tablet Take 1 tablet (81 mg total) by mouth daily. 04/04/18  Yes Colbert Ewing, MD  lidocaine (LIDODERM) 5 % Place 1 patch onto the skin daily. Remove & Discard patch within 12 hours or as directed by MD   Yes [provider]  loratadine (CLARITIN) 10 MG tablet Take 1 tablet (10 mg total) by mouth daily. 04/04/18 07/03/18 Yes Colbert Ewing, MD  mometasone-formoterol [facility]) 200-5 MCG/ACT AERO Inhale 1 puff into the lungs 2 (two) times daily. 04/04/18  Yes Colbert Ewing, MD  naproxen (NAPROSYN) 375 MG tablet Take 1 tablet (375 mg total) by mouth 2 (two) times daily with a meal. 02/28/18 02/28/19 Yes Lorella Nimrod, MD  omeprazole (PRILOSEC) 20 MG capsule Take 1 capsule (20 mg total) by mouth daily. 02/28/18 02/28/19 Yes Lorella Nimrod, MD  oxyCODONE (OXY IR/ROXICODONE) 5 MG immediate release tablet Take 5 mg by mouth every 4 (four) hours as needed for severe pain.   Yes [provider]  sertraline (ZOLOFT) 50 MG tablet Take 1 tablet (50 mg total) by mouth daily. 04/04/18 10/01/18 Yes Colbert Ewing, MD  benzonatate (TESSALON) 100 MG capsule Take 2 capsules (200 mg total) by mouth every 8 (eight) hours. 05/03/18   Delia Heady, PA-C    Family History Family History  Problem Relation Age of Onset  . Colon cancer Mother 15  . Cancer Mother   . Diabetes Father   . Hypertension Father   . Kidney disease Father   . Colon cancer Father 79  . Prostate cancer  Father   . Cancer Father   . Kidney disease Sister   . Cancer Brother     Social History Social History   Tobacco Use  . Smoking status: Former Smoker    Packs/day: 0.25    Years: 30.00    Pack years: 7.50    Types: Cigarettes  . Smokeless tobacco: Never Used  Substance Use Topics  . Alcohol use: No    Alcohol/week: 0.0 oz  . Drug use: No  Types: Marijuana    Comment: former     Allergies   Topiramate and Tramadol   Review of Systems Review of Systems  Constitutional: Negative for appetite change, chills and fever.  HENT: Negative for ear pain, rhinorrhea, sneezing and sore throat.   Eyes: Negative for photophobia and visual disturbance.  Respiratory: Positive for cough and shortness of breath. Negative for chest tightness and wheezing.   Cardiovascular: Positive for chest pain. Negative for palpitations.  Gastrointestinal: Negative for abdominal pain, blood in stool, constipation, diarrhea, nausea and vomiting.  Genitourinary: Negative for dysuria, hematuria and urgency.  Musculoskeletal: Negative for myalgias.  Skin: Negative for rash.  Neurological: Negative for dizziness, weakness and light-headedness.     Physical Exam Updated Vital Signs BP 108/69   Pulse 72   Resp 12   Ht '5\' 2"'  (1.575 m)   Wt 68.9 kg (152 lb)   LMP 11/08/2014   SpO2 99%   BMI 27.80 kg/m   Physical Exam  Constitutional: She appears well-developed and well-nourished. No distress.  Nontoxic-appearing and in no acute distress.  HENT:  Head: Normocephalic and atraumatic.  Nose: Nose normal.  Eyes: Conjunctivae and EOM are normal. Left eye exhibits no discharge. No scleral icterus.  Neck: Normal range of motion. Neck supple.  Cardiovascular: Normal rate, regular rhythm, normal heart sounds and intact distal pulses. Exam reveals no gallop and no friction rub.  No murmur heard. Pulmonary/Chest: Effort normal. No respiratory distress. She has wheezes in the right upper field, the  right middle field, the left upper field and the left middle field. She exhibits tenderness.    Abdominal: Soft. Bowel sounds are normal. She exhibits no distension. There is no tenderness. There is no guarding.  Musculoskeletal: Normal range of motion. She exhibits no edema.  No lower extremity edema, erythema or calf tenderness bilaterally.  Neurological: She is alert. She exhibits normal muscle tone. Coordination normal.  Skin: Skin is warm and dry. No rash noted.  Psychiatric: She has a normal mood and affect.  Nursing note and vitals reviewed.    ED Treatments / Results  Labs (all labs ordered are listed, but only abnormal results are displayed) Labs Reviewed  BASIC METABOLIC PANEL - Abnormal; Notable for the following components:      Result Value   Creatinine, Ser 1.06 (*)    GFR calc non Af Amer 57 (*)    All other components within normal limits  CBC WITH DIFFERENTIAL/PLATELET - Abnormal; Notable for the following components:   Eosinophils Absolute 1.5 (*)    All other components within normal limits  D-DIMER, QUANTITATIVE (NOT AT [facility])  I-STAT TROPONIN, ED    EKG EKG Interpretation  Date/Time:  Wednesday May 03 2018 06:25:38 EDT Ventricular Rate:  77 PR Interval:  156 QRS Duration: 84 QT Interval:  436 QTC Calculation: 493 R Axis:   -34 Text Interpretation:  Normal sinus rhythm Left axis deviation Prolonged QT Abnormal ECG No significant change since last tracing Confirmed by Zenovia Jarred [phone]) on 05/03/2018 10:02:44 AM   Radiology Dg Chest 2 View  Result Date: 05/03/2018 CLINICAL DATA:  Shortness of breath. EXAM: CHEST - 2 VIEW COMPARISON:  01/11/2018 FINDINGS: The cardiomediastinal contours are normal. Resolved right lung base opacity. Pulmonary vasculature is normal. No consolidation, pleural effusion, or pneumothorax. No acute osseous abnormalities are seen. IMPRESSION: No acute findings. Electronically Signed   By: Jeb Levering M.D.   On:  05/03/2018 06:25    Procedures Procedures (including critical care time)  Medications Ordered in ED Medications  sodium chloride 0.9 % bolus 1,000 mL (1,000 mLs Intravenous New Bag/Given 05/03/18 1116)  albuterol (PROVENTIL HFA;VENTOLIN HFA) 108 (90 Base) MCG/ACT inhaler 1 puff (has no administration in time range)  albuterol (PROVENTIL) (2.5 MG/3ML) 0.083% nebulizer solution 5 mg (5 mg Nebulization Given 05/03/18 0545)  ipratropium-albuterol (DUONEB) 0.5-2.5 (3) MG/3ML nebulizer solution 3 mL (3 mLs Nebulization Given 05/03/18 0837)  ipratropium-albuterol (DUONEB) 0.5-2.5 (3) MG/3ML nebulizer solution 3 mL (3 mLs Nebulization Given 05/03/18 1022)  dexamethasone (DECADRON) injection 10 mg (10 mg Intravenous Given 05/03/18 1117)     Initial Impression / Assessment and Plan / ED Course  I have reviewed the triage vital signs and the nursing notes.  Pertinent labs & imaging results that were available during my care of the patient were reviewed by me and considered in my medical decision making (see chart for details).  Clinical Course as of May 04 1215  Wed May 03, 2018  1204 Oral temp 98.2   [HK]    Clinical Course User Index [HK] Delia Heady, [place]   Patient presents to ED for evaluation of 3-hour history of chest pain, shortness of breath, wheezing and cough.  She does have a history of asthma and states that this feels similar to her prior exacerbations.  However, patient is a very poor historian so unable to get a get a history from her.  She does not have any inhalers at home.  Cough is productive with "watery" mucus.  Denies any hemoptysis, recent surgeries, recent prolonged travel, MI, DVT or PE.  Initial work-up including troponin, d-dimer, CBC, BMP unremarkable.  Chest x-ray unremarkable.  Patient is afebrile.  She was mildly tachycardic to low 100s which improved without any measures given.  Patient reports significant improvement in her symptoms with 3 breathing treatments given  here.  Will also given IM Decadron.  EKG nonischemic.  Suspect that her symptoms are due to chest wall pain from coughing as well as asthma exacerbation.  She continues to be overall well-appearing in the emergency room and states that she is ready for discharge home.  Wheezing improved from initial evaluation with 3 breathing treatments given.  Will give refill for inhaler here and antitussives to take at home.  Doubt MI or PE as a cause of her symptoms based on reassuring work-up.  Advised to return to ED for any severe worsening symptoms.  Portions of this note were generated with [employer]. Dictation errors may occur despite best attempts at proofreading.   Final Clinical Impressions(s) / ED Diagnoses   Final diagnoses:  Mild intermittent asthma with exacerbation    ED Discharge Orders        Ordered    benzonatate (TESSALON) 100 MG capsule  Every 8 hours     05/03/18 1156       Delia Heady, PA-C 05/03/18 1219    Merryl Hacker, MD 05/08/18 [phone]

## 2018-05-03 NOTE — ED Notes (Signed)
ED Provider at bedside. 

## 2018-05-04 ENCOUNTER — Ambulatory Visit
Admission: RE | Admit: 2018-05-04 | Discharge: 2018-05-04 | Disposition: A | Discharge status: Home / Self Care | Payer: No Typology Code available for payment source | Source: Ambulatory Visit | Attending: Obstetrics and Gynecology | Admitting: Obstetrics and Gynecology

## 2018-05-04 ENCOUNTER — Encounter (HOSPITAL_COMMUNITY): Payer: Self-pay

## 2018-05-04 ENCOUNTER — Ambulatory Visit (HOSPITAL_COMMUNITY)
Admission: RE | Admit: 2018-05-04 | Discharge: 2018-05-04 | Disposition: A | Discharge status: Home / Self Care | Payer: Self-pay | Source: Ambulatory Visit | Attending: Obstetrics and Gynecology | Admitting: Obstetrics and Gynecology

## 2018-05-04 VITALS — BP 118/84 | Ht 62.0 in | Wt 153.6 lb

## 2018-05-04 DIAGNOSIS — N6321 Unspecified lump in the left breast, upper outer quadrant: Secondary | ICD-10-CM

## 2018-05-04 DIAGNOSIS — N6310 Unspecified lump in the right breast, unspecified quadrant: Secondary | ICD-10-CM

## 2018-05-04 DIAGNOSIS — N6320 Unspecified lump in the left breast, unspecified quadrant: Secondary | ICD-10-CM

## 2018-05-04 DIAGNOSIS — Z01419 Encounter for gynecological examination (general) (routine) without abnormal findings: Secondary | ICD-10-CM

## 2018-05-04 DIAGNOSIS — N6311 Unspecified lump in the right breast, upper outer quadrant: Secondary | ICD-10-CM

## 2018-05-04 NOTE — Progress Notes (Signed)
Complaints of 4 mobile right breast lumps x 1 month and pain. Patient states the pain comes and goes. Patient rates the pain at a 7 out of 10.  Complaints of a mobile left breast lump x 3 months and pain. Patient states the pain comes and goes. Patient rates the pain at a 7 out of 10.  Pap Smear: Pap smear completed today. Last Pap smear was 03/29/2011 at [facility] and normal. Per patient has no history of an abnormal Pap smear. Last Pap smear result is in Epic.  Physical exam: Breasts Breasts symmetrical. No skin abnormalities bilateral breasts. No nipple retraction bilateral breasts. No nipple discharge bilateral breasts. No lymphadenopathy. Palpated a lump within the left breast at 2 o'clock 4 cm from the nipple. Palpated a lump within the right breast at 10 o'clock 7 cm from the nipple and a lump vs a thickened area at 1 o'clock 4 cm from the nipple. Complaints of pain when palpated bilateral outer breasts and right breast at 1 o'clock at area of concern on exam. Referred patient to the [facility] for a diagnostic mammogram and possible bilateral breast ultrasounds. Appointment scheduled for Thursday, May 04, 2018 at [place].        Pelvic/Bimanual   Ext Genitalia No lesions, no swelling and no discharge observed on external genitalia.         Vagina Vagina pink and normal texture. No lesions or discharge observed in vagina.          Cervix Cervix is present. Cervix pink and of normal texture. No discharge observed.     Uterus Uterus is present and palpable. Uterus in normal position and normal size.        Adnexae Bilateral ovaries present and palpable. No tenderness on palpation.         Rectovaginal No rectal exam completed today since patient had no rectal complaints. No skin abnormalities observed on exam.    Smoking History: Patient is a former smoker that quit in 2016.  Patient Navigation: Patient education provided.  Access to services provided for patient through [facility] program.   Colorectal Cancer Screening: Patient had a colonoscopy completed 07/31/2014. No complaints today. FIT Test given to patient to complete and return to [facility].  Breast and Cervical Cancer Risk Assessment: Patient has a family history of her mother having breast cancer. Patient has no known genetic mutations or history of radiation treatment to the chest before age 10. Patient has no history of cervical dysplasia, immunocompromised, or DES exposure in-utero. Patient has a 5-year risk of breast cancer at 2.2% and lifetime risk at 11.7%.

## 2018-05-04 NOTE — Patient Instructions (Signed)
Explained breast self awareness with Katrine Coho. Let patient know [facility] will cover Pap smears and HPV typing every 5 years unless has a history of abnormal Pap smears. Referred patient to the [facility] for a diagnostic mammogram and possible bilateral breast ultrasounds. Appointment scheduled for Thursday, May 04, 2018 at [place]. Let patient know will follow up with her within the next couple weeks with results of Pap smear by letter or phone. Katrine Coho verbalized understanding.  Jalaila Caradonna, [place]vil [place], RN 8:34 AM

## 2018-05-09 LAB — CYTOLOGY - PAP
Adequacy: ABSENT
Diagnosis: NEGATIVE
HPV (WINDOPATH): NOT DETECTED

## 2018-05-23 ENCOUNTER — Encounter (HOSPITAL_COMMUNITY): Payer: Self-pay | Admitting: *Deleted

## 2018-05-25 ENCOUNTER — Other Ambulatory Visit: Payer: Self-pay

## 2018-05-25 ENCOUNTER — Emergency Department (HOSPITAL_COMMUNITY): Payer: Self-pay

## 2018-05-25 ENCOUNTER — Emergency Department (HOSPITAL_COMMUNITY)
Admission: EM | Admit: 2018-05-25 | Discharge: 2018-05-25 | Discharge status: Against Medical Advice | Payer: Self-pay | Attending: Emergency Medicine | Admitting: Emergency Medicine

## 2018-05-25 ENCOUNTER — Encounter (HOSPITAL_COMMUNITY): Payer: Self-pay | Admitting: Emergency Medicine

## 2018-05-25 DIAGNOSIS — Z86718 Personal history of other venous thrombosis and embolism: Secondary | ICD-10-CM | POA: Insufficient documentation

## 2018-05-25 DIAGNOSIS — Z79899 Other long term (current) drug therapy: Secondary | ICD-10-CM | POA: Insufficient documentation

## 2018-05-25 DIAGNOSIS — J4521 Mild intermittent asthma with (acute) exacerbation: Secondary | ICD-10-CM | POA: Insufficient documentation

## 2018-05-25 DIAGNOSIS — Z7982 Long term (current) use of aspirin: Secondary | ICD-10-CM | POA: Insufficient documentation

## 2018-05-25 DIAGNOSIS — Z87891 Personal history of nicotine dependence: Secondary | ICD-10-CM | POA: Insufficient documentation

## 2018-05-25 LAB — CBC WITH DIFFERENTIAL/PLATELET
BASOS PCT: 1 %
Basophils Absolute: 0.1 10*3/uL (ref 0.0–0.1)
EOS ABS: 1.5 10*3/uL — AB (ref 0.0–0.7)
Eosinophils Relative: 16 %
HEMATOCRIT: 46.8 % — AB (ref 36.0–46.0)
HEMOGLOBIN: 15.4 g/dL — AB (ref 12.0–15.0)
LYMPHS ABS: 3 10*3/uL (ref 0.7–4.0)
Lymphocytes Relative: 33 %
MCH: 28.7 pg (ref 26.0–34.0)
MCHC: 32.9 g/dL (ref 30.0–36.0)
MCV: 87.2 fL (ref 78.0–100.0)
MONO ABS: 0.8 10*3/uL (ref 0.1–1.0)
Monocytes Relative: 9 %
NEUTROS ABS: 3.8 10*3/uL (ref 1.7–7.7)
NEUTROS PCT: 41 %
Platelets: 354 10*3/uL (ref 150–400)
RBC: 5.37 MIL/uL — ABNORMAL HIGH (ref 3.87–5.11)
RDW: 14.2 % (ref 11.5–15.5)
WBC: 9.2 10*3/uL (ref 4.0–10.5)

## 2018-05-25 LAB — TROPONIN I

## 2018-05-25 LAB — COMPREHENSIVE METABOLIC PANEL
ALK PHOS: 82 U/L (ref 38–126)
ALT: 28 U/L (ref 14–54)
AST: 33 U/L (ref 15–41)
Albumin: 3.9 g/dL (ref 3.5–5.0)
Anion gap: 8 (ref 5–15)
BILIRUBIN TOTAL: 0.4 mg/dL (ref 0.3–1.2)
BUN: 12 mg/dL (ref 6–20)
CO2: 26 mmol/L (ref 22–32)
CREATININE: 1.07 mg/dL — AB (ref 0.44–1.00)
Calcium: 9.4 mg/dL (ref 8.9–10.3)
Chloride: 109 mmol/L (ref 101–111)
GFR calc Af Amer: >60 mL/min (ref >60)
GFR, EST NON AFRICAN AMERICAN: 57 mL/min — AB (ref >60)
Glucose, Bld: 83 mg/dL (ref 65–99)
Potassium: 4.4 mmol/L (ref 3.5–5.1)
Sodium: 143 mmol/L (ref 135–145)
TOTAL PROTEIN: 7.6 g/dL (ref 6.5–8.1)

## 2018-05-25 MED ORDER — METHYLPREDNISOLONE SODIUM SUCC 125 MG IJ SOLR
125.0000 mg | Freq: Once | INTRAMUSCULAR | Status: AC
  Administered 2018-05-25: 125 mg via INTRAVENOUS

## 2018-05-25 MED ORDER — ALBUTEROL SULFATE HFA 108 (90 BASE) MCG/ACT IN AERS
2.0000 | INHALATION_SPRAY | Freq: Once | RESPIRATORY_TRACT | Status: AC
  Administered 2018-05-25: 2 via RESPIRATORY_TRACT
  Filled 2018-05-25: qty 6.7

## 2018-05-25 MED ORDER — IPRATROPIUM-ALBUTEROL 0.5-2.5 (3) MG/3ML IN SOLN
3.0000 mL | Freq: Once | RESPIRATORY_TRACT | Status: AC
  Administered 2018-05-25: 3 mL via RESPIRATORY_TRACT

## 2018-05-25 MED ORDER — PREDNISONE 10 MG (21) PO TBPK: ORAL_TABLET | Freq: Every day | ORAL | 0 refills | Status: DC

## 2018-05-25 MED ORDER — MAGNESIUM SULFATE 2 GM/50ML IV SOLN
2.0000 g | Freq: Once | INTRAVENOUS | Status: AC
  Administered 2018-05-25: 2 g via INTRAVENOUS
  Filled 2018-05-25: qty 50

## 2018-05-25 MED ORDER — ALBUTEROL SULFATE (2.5 MG/3ML) 0.083% IN NEBU: 5.0000 mg | INHALATION_SOLUTION | Freq: Once | RESPIRATORY_TRACT | Status: DC

## 2018-05-25 MED ORDER — ONDANSETRON 8 MG PO TBDP: 8.0000 mg | ORAL_TABLET | Freq: Three times a day (TID) | ORAL | 0 refills | Status: DC | PRN

## 2018-05-25 MED ORDER — SODIUM CHLORIDE 0.9 % IV BOLUS
1000.0000 mL | Freq: Once | INTRAVENOUS | Status: AC
Start: 2018-05-25 — End: 2018-05-25
  Administered 2018-05-25: 1000 mL via INTRAVENOUS

## 2018-05-25 MED ORDER — METHYLPREDNISOLONE SODIUM SUCC 125 MG IJ SOLR
INTRAMUSCULAR | Status: AC
  Filled 2018-05-25: qty 2

## 2018-05-25 MED ORDER — ALBUTEROL (5 MG/ML) CONTINUOUS INHALATION SOLN
10.0000 mg/h | INHALATION_SOLUTION | Freq: Once | RESPIRATORY_TRACT | Status: AC
  Administered 2018-05-25: 10 mg/h via RESPIRATORY_TRACT
  Filled 2018-05-25: qty 20

## 2018-05-25 MED ORDER — IPRATROPIUM-ALBUTEROL 0.5-2.5 (3) MG/3ML IN SOLN
RESPIRATORY_TRACT | Status: AC
  Filled 2018-05-25: qty 6

## 2018-05-25 MED ORDER — BENZONATATE 100 MG PO CAPS: 100.0000 mg | ORAL_CAPSULE | Freq: Three times a day (TID) | ORAL | 0 refills | Status: DC

## 2018-05-25 MED ORDER — IPRATROPIUM BROMIDE 0.02 % IN SOLN
0.5000 mg | Freq: Once | RESPIRATORY_TRACT | Status: AC
  Administered 2018-05-25: 0.5 mg via RESPIRATORY_TRACT
  Filled 2018-05-25: qty 2.5

## 2018-05-25 NOTE — ED Notes (Signed)
Pt verbalized understanding that she could return at any time she felt the need.

## 2018-05-25 NOTE — Discharge Instructions (Addendum)
Continue your breathing treatment every 4 hours.  Prednisone as prescribed until all gone.  Tessalon for cough.  Zofran as needed for nausea vomiting.  Follow-up with family doctor.

## 2018-05-25 NOTE — ED Provider Notes (Signed)
[place] DEPT Provider Note   CSN: [id number] Arrival date & time: 05/25/18  1100     History   Chief Complaint Chief Complaint  Patient presents with  . Asthma    HPI Kathleen [name] is a 57 y.o. female.  HPI Kathleen [name] is a 57 y.o. female with history of asthma, bipolar disorder, chronic pain, history of polysubstance abuse in the past, presents to emergency department complaining of shortness of breath.  Patient is telling me that she has had cough for the last few weeks.  She states for the last 2 days, the cough has gotten worse and she has had associated nausea, vomiting, chest pain and abdominal pain.  She has been using at home inhalers with no relief.  She was seen 2 weeks ago to emergency department and prescribed steroids which she states she took but they were not helping.  Reports subjective fever and chills.  Denies any  Past Medical History:  Diagnosis Date  . Asthma    2 sets of PFT's in 04/09 without sign variability. Last set with significant decrease in FEV1 with saline alone, suggesting Asthma but  recommended clinical corelation   . Asthma   . Bipolar 1 disorder [facility])    therapist is Caryl Asp and is followed by [employer]  . Blackout    negative work up including ESR, ANA, opthalmology referral, carotid dopplers, 2D echo, MRI and EEG.  . 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 Bronchitis   . Chronic headache   . Chronic pain    normal work up including TSH, RPR, B12, HIV, plain films, 2 ESR's, ANA, CK, RF along with routine CBC, CMET and UA. Further work up includes CRP, ESR, SPEP/UPEP, hepatitis erology, A1C , repeat ANA  . DUB (dysfunctional uterine bleeding)    and pelvic pain, with negative endometrial bx in 07/09 and transvaginal U/S significant for mild fibroids in 0/09.  Marland Kitchen GERD (gastroesophageal reflux disease)   . Lower extremity edema    Neg ABI's, normal 2D  echo, normal albumin  . Menorrhagia   . Ovarian cyst   . Polysubstance abuse ([place])    none since March 17,2009.  Marland Kitchen Sleep apnea    NO CPAP  . Tubular adenoma of colon     Patient Active Problem List   Diagnosis Date Noted  . Vertigo 01/11/2018  . Odynophagia 08/10/2016  . Partial Achilles tendon tear 08/10/2016  . Lumbar radiculopathy 07/26/2016  . Wound infection 07/20/2016  . Tooth loose 03/24/2016  . Left hip pain 02/05/2016  . Depression 09/19/2015  . Hot flashes 02/05/2015  . Flashing lights seen 02/05/2015  . Abnormal uterine bleeding 07/24/2014  . Left knee pain 06/18/2014  . Screening for breast cancer 06/18/2014  . Menorrhagia 05/24/2014  . Dyspareunia 05/24/2014  . Breast lump in female and tenderness 05/24/2014  . Protein-calorie malnutrition, severe ([place]) 05/15/2014  . Asthma 05/13/2014  . Neck pain on left side 01/19/2012  . DVT 12/21/2010  . ABNORMAL VAGINAL BLEEDING 12/21/2010  . BIPOLAR DISORDER UNSPECIFIED 01/21/2010  . GERD 01/21/2010  . ARTHRITIS, GENERALIZED 10/08/2008  . SNORING 10/08/2008  . DISTURBANCE OF SKIN SENSATION 08/29/2008  . Chronic low back pain 08/08/2008  . INSOMNIA 05/31/2008  . VISION LOSS, SUDDEN 03/25/2008  . BREAST LUMP 03/25/2008  . LEG PAIN, BILATERAL 03/19/2008  . HALLUCINATIONS 03/18/2008  . GENERALIZED ANXIETY DISORDER 03/14/2008  . DRUG  ABUSE 03/14/2008  . MIGRAINE VARIANT 03/14/2008    Past Surgical History:  Procedure Laterality Date  . CESAREAN SECTION    . CESAREAN SECTION    . HERNIA REPAIR    . Left partial oophorectomy    . OOPHORECTOMY     1/2 ovary removed  . VAGINA SURGERY     mesh     OB History    Gravida  3   Para  2   Term  2   Preterm  0   AB  1   Living  2     SAB  0   TAB  1   Ectopic  0   Multiple      Live Births  2            Home Medications    Prior to Admission medications   Medication Sig Start Date End Date Taking? Authorizing Provider  albuterol  (PROVENTIL HFA;VENTOLIN HFA) 108 (90 Base) MCG/ACT inhaler Inhale 2 puffs into the lungs every 4 (four) hours as needed for wheezing or shortness of breath. 04/04/18   Colbert Ewing, MD  albuterol (PROVENTIL) (2.5 MG/3ML) 0.083% nebulizer solution Take 3 mLs (2.5 mg total) by nebulization every 6 (six) hours as needed for wheezing or shortness of breath. 04/04/18   Colbert Ewing, MD  aspirin EC 81 MG tablet Take 1 tablet (81 mg total) by mouth daily. 04/04/18   Colbert Ewing, MD  benzonatate (TESSALON) 100 MG capsule Take 2 capsules (200 mg total) by mouth every 8 (eight) hours. 05/03/18   Khatri, Hina, PA-C  lidocaine (LIDODERM) 5 % Place 1 patch onto the skin daily. Remove & Discard patch within 12 hours or as directed by MD    [provider]  loratadine (CLARITIN) 10 MG tablet Take 1 tablet (10 mg total) by mouth daily. 04/04/18 07/03/18  Colbert Ewing, MD  mometasone-formoterol (DULERA) 200-5 MCG/ACT AERO Inhale 1 puff into the lungs 2 (two) times daily. 04/04/18   Colbert Ewing, MD  naproxen (NAPROSYN) 375 MG tablet Take 1 tablet (375 mg total) by mouth 2 (two) times daily with a meal. 02/28/18 02/28/19  Lorella Nimrod, MD  omeprazole (PRILOSEC) 20 MG capsule Take 1 capsule (20 mg total) by mouth daily. 02/28/18 02/28/19  Lorella Nimrod, MD  oxyCODONE (OXY IR/ROXICODONE) 5 MG immediate release tablet Take 5 mg by mouth every 4 (four) hours as needed for severe pain.    [provider]  sertraline (ZOLOFT) 50 MG tablet Take 1 tablet (50 mg total) by mouth daily. 04/04/18 10/01/18  Colbert Ewing, MD    Family History Family History  Problem Relation Age of Onset  . Colon cancer Mother 60  . Breast cancer Mother 28  . Diabetes Father   . Hypertension Father   . Kidney disease Father   . Colon cancer Father 56  . Prostate cancer Father   . Cancer Father   . Kidney disease Sister   . Cancer Brother   . Breast cancer Maternal Grandmother 52    Social History Social  History   Tobacco Use  . Smoking status: Former Smoker    Packs/day: 0.25    Years: 30.00    Pack years: 7.50    Types: Cigarettes  . Smokeless tobacco: Never Used  Substance Use Topics  . Alcohol use: Yes    Alcohol/week: 0.0 oz    Comment: socailly  . Drug use: No    Types: Marijuana    Comment: former  Allergies   Topiramate and Tramadol   Review of Systems Review of Systems  Constitutional: Negative for chills and fever.  HENT: Negative for congestion.   Respiratory: Positive for cough, shortness of breath and wheezing. Negative for chest tightness.   Cardiovascular: Negative for chest pain, palpitations and leg swelling.  Gastrointestinal: Positive for nausea and vomiting. Negative for abdominal pain and diarrhea.  Genitourinary: Negative for dysuria, flank pain, pelvic pain, vaginal bleeding, vaginal discharge and vaginal pain.  Musculoskeletal: Negative for arthralgias, myalgias, neck pain and neck stiffness.  Skin: Negative for rash.  Neurological: Negative for dizziness, weakness and headaches.  All other systems reviewed and are negative.    Physical Exam Updated Vital Signs BP (!) 152/114   Pulse (!) 113   Resp (!) 36   LMP 11/08/2014   SpO2 100%   Physical Exam  Constitutional: She is oriented to person, place, and time. She appears well-developed and well-nourished. No distress.  HENT:  Head: Normocephalic.  Eyes: Conjunctivae are normal.  Neck: Neck supple.  Cardiovascular: Normal rate, regular rhythm and normal heart sounds.  Pulmonary/Chest: She is in respiratory distress. She has wheezes. She has no rales.  Decreased air movement bilaterally, inspiratory and expiratory wheezes  Abdominal: Soft. Bowel sounds are normal. She exhibits no distension. There is no tenderness. There is no rebound.  Musculoskeletal: She exhibits no edema.  Neurological: She is alert and oriented to person, place, and time.  Skin: Skin is warm and dry.    Psychiatric: She has a normal mood and affect. Her behavior is normal.  Nursing note and vitals reviewed.    ED Treatments / Results  Labs (all labs ordered are listed, but only abnormal results are displayed) Labs Reviewed  CBC WITH DIFFERENTIAL/PLATELET - Abnormal; Notable for the following components:      Result Value   RBC 5.37 (*)    Hemoglobin 15.4 (*)    HCT 46.8 (*)    Eosinophils Absolute 1.5 (*)    All other components within normal limits  COMPREHENSIVE METABOLIC PANEL - Abnormal; Notable for the following components:   Creatinine, Ser 1.07 (*)    GFR calc non Af Amer 57 (*)    All other components within normal limits  TROPONIN I    EKG None  Radiology Dg Chest 1 View  Result Date: 05/25/2018 CLINICAL DATA:  Cough and shortness-of-breath. EXAM: CHEST  1 VIEW COMPARISON:  05/03/2018 and 12/28/2017 FINDINGS: The heart size and mediastinal contours are within normal limits. Both lungs are clear. The visualized skeletal structures are unremarkable. IMPRESSION: No active disease. Electronically Signed   By: Marin Olp M.D.   On: 05/25/2018 13:24    Procedures Procedures (including critical care time)  Medications Ordered in ED Medications  albuterol (PROVENTIL) (2.5 MG/3ML) 0.083% nebulizer solution 5 mg (has no administration in time range)  methylPREDNISolone sodium succinate (SOLU-MEDROL) 125 mg/2 mL injection (has no administration in time range)  albuterol (PROVENTIL,VENTOLIN) solution continuous neb (has no administration in time range)  ipratropium (ATROVENT) nebulizer solution 0.5 mg (has no administration in time range)  magnesium sulfate IVPB 2 g 50 mL (has no administration in time range)  methylPREDNISolone sodium succinate (SOLU-MEDROL) 125 mg/2 mL injection 125 mg (125 mg Intravenous Given 05/25/18 1117)  ipratropium-albuterol (DUONEB) 0.5-2.5 (3) MG/3ML nebulizer solution 3 mL (3 mLs Nebulization Given 05/25/18 1111)     Initial Impression /  Assessment and Plan / ED Course  I have reviewed the triage vital signs and the nursing  notes.  Pertinent labs & imaging results that were available during my care of the patient were reviewed by me and considered in my medical decision making (see chart for details).     Patient with difficulty breathing, history of asthma, feels the same.  Upon arrival to triage, patient only able to speak 1-2 word sentences, diminished breath sounds in all lung fields.  Patient appears to be in significant respiratory distress.  Started on DuoNeb and I was asked to come see her.  Patient has almost finished her DuoNeb and is now speaking full sentences.  She is coughing and having inspiratory and expiratory wheezes.  Will give a an hour-long treatment, check labs and x-ray.  3:29 PM Patient received 1 hour-long treatment.  She is feeling much better.  She is speaking full sentences.  Her wheezing has improved.  Her labs and chest x-ray unremarkable.  She also received 2 g of magnesium and Solu-Medrol.  She ambulated in the hallway, and unfortunately dropped her oxygen saturation to 86% soon as she started walking.  Due to hypoxia, advised patient she probably would need to be admitted to the hospital, however patient refused.  She stated that she had plans tomorrow and did not want to be in the hospital.  I explained to her that her oxygen saturation may drop even lower, and her symptoms may get worse.  She stated that she will come back to the hospital of that is the case.  I will let her go home AMA.  We discussed risks of leaving and return precautions.  I will place her on redness own and give her some nausea and cough medicine however.  Patient was also follow-up with family doctor.  Vitals:   05/25/18 1231 05/25/18 1300 05/25/18 1305 05/25/18 1330  BP: 113/80 117/68  127/75  Pulse: 79 80  88  Resp: 19 (!) 25  16  SpO2: 100% 100%  100%  Height:   '5\' 2"'  (1.575 m)      Final Clinical Impressions(s) / ED  Diagnoses   Final diagnoses:  Mild intermittent asthma with exacerbation    ED Discharge Orders        Ordered    predniSONE (STERAPRED UNI-PAK 21 TAB) 10 MG (21) TBPK tablet  Daily     05/25/18 1526    benzonatate (TESSALON) 100 MG capsule  Every 8 hours     05/25/18 1526    ondansetron (ZOFRAN ODT) 8 MG disintegrating tablet  Every 8 hours PRN     05/25/18 1526       Mozes Sagar, [place], PA-C 05/25/18 1652    Duffy Bruce, MD 05/26/18 0700

## 2018-05-25 NOTE — ED Notes (Addendum)
Pt leaving amicably AMA. This RN witnessed her call and make her follow up appointment prior to leaving. Pt ambulated out of the ED, she declined the wheelchair that was offered.

## 2018-05-25 NOTE — ED Triage Notes (Signed)
Worsening asthma; unable to speak related to SOB; diminished.

## 2018-05-29 ENCOUNTER — Encounter: Payer: Self-pay | Admitting: Internal Medicine

## 2018-05-29 ENCOUNTER — Ambulatory Visit (INDEPENDENT_AMBULATORY_CARE_PROVIDER_SITE_OTHER): Payer: Self-pay | Admitting: Internal Medicine

## 2018-05-29 ENCOUNTER — Other Ambulatory Visit: Payer: Self-pay

## 2018-05-29 VITALS — BP 123/84 | HR 111 | Temp 98.8°F | Ht 62.0 in | Wt 149.5 lb

## 2018-05-29 DIAGNOSIS — J45901 Unspecified asthma with (acute) exacerbation: Secondary | ICD-10-CM

## 2018-05-29 DIAGNOSIS — Z79899 Other long term (current) drug therapy: Secondary | ICD-10-CM

## 2018-05-29 DIAGNOSIS — Z7952 Long term (current) use of systemic steroids: Secondary | ICD-10-CM

## 2018-05-29 DIAGNOSIS — K0889 Other specified disorders of teeth and supporting structures: Secondary | ICD-10-CM

## 2018-05-29 MED ORDER — NAPROXEN 375 MG PO TABS: 375.0000 mg | ORAL_TABLET | Freq: Two times a day (BID) | ORAL | 0 refills | Status: AC

## 2018-05-29 MED ORDER — ALBUTEROL SULFATE (2.5 MG/3ML) 0.083% IN NEBU: 2.5000 mg | INHALATION_SOLUTION | Freq: Four times a day (QID) | RESPIRATORY_TRACT | 2 refills | Status: DC | PRN

## 2018-05-29 MED ORDER — AMOXICILLIN-POT CLAVULANATE 875-125 MG PO TABS: 1.0000 | ORAL_TABLET | Freq: Two times a day (BID) | ORAL | 0 refills | Status: AC

## 2018-05-29 NOTE — Progress Notes (Signed)
CC: asthma exacerbation   HPI:  [name] is a 57 y.o. female with a past medical history listed below here today for follow up of her asthma. The patient was in the ED on 05/25/2018 and was treated for an acute asthma exacerbation. She was in mild respiratory distress and was hypoxic to 80s on RA requiring an hour long breathing treatment. The ED provider recommended hospital admission, but the patient declined. She was discharged on a prednisone taper. She is presenting today for hospital follow up. She states she still does not feel well. Complaining of chest tightness and SOB on exertion, as well as cough. She states is takes her weeks to recover from an asthma exacerbation. She feels a little better since her ED visit, but not back to her baseline.  The patient is also complaining of left tooth pain and swelling. She states she has been unable to eat because of the pain. She reports facial swelling several days prior and is requesting an antibiotic.   For details of today's visit and the status of her chronic medical issues please refer to the assessment and plan.  Past Medical History:  Diagnosis Date  . Asthma    2 sets of PFT's in 04/09 without sign variability. Last set with significant decrease in FEV1 with saline alone, suggesting Asthma but  recommended clinical corelation   . Asthma   . Bipolar 1 disorder [facility])    therapist is Caryl Asp and is followed by [employer]  . Blackout    negative work up including ESR, ANA, opthalmology referral, carotid dopplers, 2D echo, MRI and EEG.  . 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 Bronchitis   . Chronic headache   . Chronic pain    normal work up including TSH, RPR, B12, HIV, plain films, 2 ESR's, ANA, CK, RF along with routine CBC, CMET and UA. Further work up includes CRP, ESR, SPEP/UPEP, hepatitis erology, A1C , repeat ANA  . DUB (dysfunctional uterine bleeding)    and  pelvic pain, with negative endometrial bx in 07/09 and transvaginal U/S significant for mild fibroids in 0/09.  Marland Kitchen GERD (gastroesophageal reflux disease)   . Lower extremity edema    Neg ABI's, normal 2D echo, normal albumin  . Menorrhagia   . Ovarian cyst   . Polysubstance abuse ([place])    none since March 17,2009.  Marland Kitchen Sleep apnea    NO CPAP  . Tubular adenoma of colon    Review of Systems:   Review of Systems  Constitutional: Negative for chills and fever.  Respiratory: Positive for cough, shortness of breath and wheezing.   Cardiovascular: Positive for chest pain (tightness).  Gastrointestinal: Negative for nausea and vomiting.    Physical Exam:  Vitals:   05/29/18 1408  BP: 123/84  Pulse: (!) 111  Temp: 98.8 F (37.1 C)  TempSrc: Oral  SpO2: 98%  Weight: 149 lb 8 oz (67.8 kg)  Height: '5\' 2"'  (1.575 m)   General: Sitting  comfortably, NAD HEENT: [place]/AT, EOMI, no scleral icterus; left canine incisor loose and with surrounding erythema and swelling Cardiac: RRR, No R/M/G appreciated Pulm: normal effort, no increased WOB, diffuse expiratory wheezing in all lung fields, prolonged expiratory phase Ext: extremities well perfused, no peripheral edema Neuro: alert and oriented X3, cranial nerves II-XII grossly intact   Assessment & Plan:   See Encounters Tab for problem based charting.  Patient discussed  with Dr. Lynnae January

## 2018-05-29 NOTE — Assessment & Plan Note (Addendum)
Patient presenting for hospital follow-up after an ED visit for acute asthma exacerbation.  She was treated with Solu-Medrol, magnesium, and prolonged breathing treatments.  She was hypoxic into the 80s and was suggested she be admitted to the hospital, but she declined.  Since that time, she has had mild improvement in her symptoms.  She is currently on a prednisone taper and is taking 40 mg daily.  She is doing albuterol nebulizers every 3 hours.  Vital signs are stable, patient is saturating high 90s on room air.  On exam she is in no acute distress.  She has no increased work of breathing, but does have expiratory wheezing in all lung fields. Will continue prednisone taper and frequent breathing treatments.   Plan: -Continue Prednisone Taper  -Continue Albuterol nebs every 4-6 hours as needed  -RTC if symptoms do not improve after finishing steroid taper  -Refilled patients Naproxen for muscle soreness secondary to cough and asthma exacerbation

## 2018-05-29 NOTE — Assessment & Plan Note (Addendum)
Patient complaining of pain and inability to eat due to tooth pain and surrounding abscess. She was given resources for dental clinic last visit but did not follow up. On exam, the tooth is loose with surrounding erythema and swelling concerning for acute infectious process.   Plan: -Augmentin BID 7 days  -provided information for reduced cost dental clinics

## 2018-05-29 NOTE — Patient Instructions (Addendum)
Ms. Lukins,   It was nice meeting you today.   Continue your steroid taper and albuterol breathing treatments every 4 hours. Your inhalers have refills and you can request them to be filled by your pharmacy.   I have also prescribed you an antibiotic for your tooth infection. Take Augmentin 875 mg twice daily for 7 days.   If your breathing does not improve after you finish your steroid taper, please return to clinic to be evaluated.

## 2018-05-30 NOTE — Progress Notes (Signed)
Internal Medicine Clinic Attending  Case discussed with Dr. LaCroce at the time of the visit.  We reviewed the resident's history and exam and pertinent patient test results.  I agree with the assessment, diagnosis, and plan of care documented in the resident's note.  

## 2018-06-16 ENCOUNTER — Other Ambulatory Visit: Payer: Self-pay

## 2018-06-16 ENCOUNTER — Ambulatory Visit (INDEPENDENT_AMBULATORY_CARE_PROVIDER_SITE_OTHER): Payer: Self-pay | Admitting: Internal Medicine

## 2018-06-16 VITALS — BP 119/85 | HR 92 | Temp 97.4°F | Ht 62.0 in | Wt 149.6 lb

## 2018-06-16 DIAGNOSIS — Z79899 Other long term (current) drug therapy: Secondary | ICD-10-CM

## 2018-06-16 DIAGNOSIS — J45909 Unspecified asthma, uncomplicated: Secondary | ICD-10-CM

## 2018-06-16 DIAGNOSIS — Z7951 Long term (current) use of inhaled steroids: Secondary | ICD-10-CM

## 2018-06-16 DIAGNOSIS — J4541 Moderate persistent asthma with (acute) exacerbation: Secondary | ICD-10-CM

## 2018-06-16 MED ORDER — MONTELUKAST SODIUM 10 MG PO TABS: 10.0000 mg | ORAL_TABLET | Freq: Every day | ORAL | 1 refills | Status: DC

## 2018-06-16 MED ORDER — PREDNISONE 10 MG PO TABS: ORAL_TABLET | ORAL | 0 refills | Status: AC

## 2018-06-16 NOTE — Patient Instructions (Addendum)
It was nice seeing you today. Thank you for choosing [facility] for your Primary Care.   Today we talked about:  1) Asthma: I'm sorry you are continuing to experience symptoms. I think you need a longer steroid course. Please use your daily maintenance inhaler as directed and your albuterol as well. Please take the steroids according to the directions printed on the bottle.  2) Please stop by radiology for a chest x-ray. Because your symptoms are persistent, it's important to make sure you dont have pneumonia.  3) Please see [place] in 1-week if your symptoms are not improving at all. Please schedule an appointment with your primary care physician to discuss the frequency of your asthma attacks.    Please contact the clinic if you have any problems, or need to be seen sooner.

## 2018-06-16 NOTE — Progress Notes (Signed)
CC: persistent cough and wheezing  HPI:  Kathleen [name] is a 57 y.o. F with medical history as noted below who presents today for follow-up of persistent cough and wheezing. Kathleen [name] was seen in ED 6/12 with an acute asthma exacerbation. She was hypoxic and admission was recommended but ultimately patient left AMA. She was provided a prednisone taper, albuterol nebulizer's and instructions for close follow-up with PCP. At follow-up 6/17, she was feeling a little better but not yet back to baseline and mentioned it takes several weeks for her to recover from an asthma exacerbation.   Since that visit she's completed her course of prednisone. She complains of worsened cough, wheezing and sputum production (mostly clear, sometimes yellow or green) since stopping steroids. She also ran out of her [name] a few days ago.   Chart review shows she was seen 5/22 with 3-hr history of wheezing, SOB and cough. Treated with prn albuterol. Presented 6/12 with worsened symptoms and treated as outlined above. Last asthma exacerbation prior to this was 1/19 when she presented with cough, wheezing, productive cough (clear and green sputum). She was treated with steroid taper, breathing treatments  For details regarding today's visit and the status of their chronic medical issues, please refer to the assessment and plan.  Past Medical History:  Diagnosis Date  . Asthma    2 sets of PFT's in 04/09 without sign variability. Last set with significant decrease in FEV1 with saline alone, suggesting Asthma but  recommended clinical corelation   . Asthma   . Bipolar 1 disorder [facility])    therapist is Caryl Asp and is followed by [employer]  . Blackout    negative work up including ESR, ANA, opthalmology referral, carotid dopplers, 2D echo, MRI and EEG.  . 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 Bronchitis   . Chronic headache   . Chronic pain    normal work up  including TSH, RPR, B12, HIV, plain films, 2 ESR's, ANA, CK, RF along with routine CBC, CMET and UA. Further work up includes CRP, ESR, SPEP/UPEP, hepatitis erology, A1C , repeat ANA  . DUB (dysfunctional uterine bleeding)    and pelvic pain, with negative endometrial bx in 07/09 and transvaginal U/S significant for mild fibroids in 0/09.  Marland Kitchen GERD (gastroesophageal reflux disease)   . Lower extremity edema    Neg ABI's, normal 2D echo, normal albumin  . Menorrhagia   . Ovarian cyst   . Polysubstance abuse ([place])    none since March 17,2009.  Marland Kitchen Sleep apnea    NO CPAP  . Tubular adenoma of colon    Review of Systems:   General: Denies fevers, chills, weight loss HEENT: Admits to cough. Denies sneezing, runny nose, sore throat, sinus pressure, acute changes in vision Cardiac: Admits to DOE. Denies CP, palpitations Pulmonary: Admits to wheezing and SOB. Denied PND Abd: Denies abdominal pain, nausea, vomiting or diarrhea.   Physical Exam: General: Alert, in no acute distress. Pleasant and conversant HEENT: No icterus, injection or ptosis. Voice is hoarse. Erythema and irritation of posterior oropharynx but no exudates. No sinus tenderness.  Cardiac: RRR, no MGR Pulmonary: Diffuse expiratory wheezes throughout. ?rales RLL. Speaks in complete sentences. No DOE Abd: Soft, non-tender. +bs Extremities: Warm, perfused. No significant pedal edema.   Vitals:   06/16/18 1339  BP: 119/85  Pulse: 92  Temp: (!) 97.4 F (36.3 C)  TempSrc: Oral  SpO2: 98%  Weight: 149 lb 9.6 oz (67.9 kg)  Height: _0  (1.575 m)   Body mass index is 27.36 kg/m.  Assessment & Plan:   See Encounters Tab for problem based charting.  Patient discussed with Dr. Rebeca Alert

## 2018-06-16 NOTE — Progress Notes (Deleted)
   CC: follow-up of   HPI:  [name] is a 57 y.o.   For details regarding today's visit and the status of their chronic medical issues, please refer to the assessment and plan.  Past Medical History:  Diagnosis Date  . Asthma    2 sets of PFT's in 04/09 without sign variability. Last set with significant decrease in FEV1 with saline alone, suggesting Asthma but  recommended clinical corelation   . Asthma   . Bipolar 1 disorder [facility])    therapist is Caryl Asp and is followed by [employer]  . Blackout    negative work up including ESR, ANA, opthalmology referral, carotid dopplers, 2D echo, MRI and EEG.  . 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 Bronchitis   . Chronic headache   . Chronic pain    normal work up including TSH, RPR, B12, HIV, plain films, 2 ESR's, ANA, CK, RF along with routine CBC, CMET and UA. Further work up includes CRP, ESR, SPEP/UPEP, hepatitis erology, A1C , repeat ANA  . DUB (dysfunctional uterine bleeding)    and pelvic pain, with negative endometrial bx in 07/09 and transvaginal U/S significant for mild fibroids in 0/09.  Marland Kitchen GERD (gastroesophageal reflux disease)   . Lower extremity edema    Neg ABI's, normal 2D echo, normal albumin  . Menorrhagia   . Ovarian cyst   . Polysubstance abuse ([place])    none since March 17,2009.  Marland Kitchen Sleep apnea    NO CPAP  . Tubular adenoma of colon    Review of Systems:   General: Denies fevers, chills, weight loss, fatigue HEENT: Denies acute changes in vision, sore throat, dysphagia Cardiac: Denies CP, SOB, palpitations Pulmonary: Denies cough, wheezes, PND Abd: Denies abdominal pain, changes in bowels Extremities: Denies weakness or swelling  Physical Exam: General: Alert, in no acute distress. Pleasant and conversant HEENT: No icterus, injection or ptosis. No hoarseness or dysarthria  Cardiac: RRR, no MGR appreciated Pulmonary: CTA BL with normal WOB on RA.  Able to speak in complete sentences Abd: Soft, non-tender. +bs Extremities: Warm, perfused. No significant pedal edema.    Vitals:   06/16/18 1339  BP: 119/85  Pulse: 92  Temp: (!) 97.4 F (36.3 C)  TempSrc: Oral  SpO2: 98%  Weight: 149 lb 9.6 oz (67.9 kg)  Height: '5\' 2"'$  (1.575 m)   Body mass index is 27.36 kg/m.  Assessment & Plan:   See Encounters Tab for problem based charting.  Patient {GC/GE:3044014::"discussed with","seen with"} Dr. {NAMES:[id number]::"Butcher","Granfortuna","E. Hoffman","Klima","Mullen","Narendra","Raines","Vincent"}

## 2018-06-16 NOTE — Assessment & Plan Note (Addendum)
Assessment:  Patient here with persistent and worsening cough, SOB and wheezing after completing her prednisone taper. Shes also been out of her [employer] for several days but has been using her albuterol nebulizers 3-4 times a day. She denied any fevers or chills but does report her cough is productive (usually clear, sometimes green or yellow). On exam she is hoarse but speaking in full sentences. Also has diffuse expiratory wheezes but is not in distress but was difficult to exclude rales in RLL. She is afebrile and saturating well on RA.       Plan: Suspect she needs a longer steroid course and daily use of her maintenance inhaler. Discussed need for chest x-ray to evaluate for pneumonia given her worsened symptoms however patient declined noting that it was done last month and was 'fine.'  -Prednisone taper (60mg  x4 days, 40mg  x4 days, 20mg  x4 days and 10mg  x4 days) -Ordered 2-view CXR, encouraged patient to head upstairs for evaluation prior to leaving -Patient given sample of ICS/LABA and SABA with spacer -Also advised to take anti-histamine for possible allergic component -Pt to RTC if symptoms fail to improve, otherwise instructed to make appointment with PCP within 1-2 months to discuss her chronic medical issues and frequency of asthma exacerbations -Instructed patient to schedule follow-up with PCP within 1 month  -She may benefit from pulmonology referral given frequency of exacerbations

## 2018-06-19 ENCOUNTER — Other Ambulatory Visit: Payer: Self-pay | Admitting: Internal Medicine

## 2018-06-19 DIAGNOSIS — J45901 Unspecified asthma with (acute) exacerbation: Secondary | ICD-10-CM

## 2018-06-19 NOTE — Telephone Encounter (Signed)
Needs refill predisone 10mg , all inhales and proventil 2.5mg - needs to go [facility]; pt contact # [phone]  Patient also wants to check on lab results

## 2018-06-19 NOTE — Telephone Encounter (Addendum)
Albuterol with 6 refills and dulera with 11 refills sent 04/04/2018 to [facility]. Paper Rx for prednisone given to patient at [facility] on 06/16/2018. Attempted to reach patient for clarification on requests. No answer. Left message on VM requesting return call. Hubbard Hartshorn, RN, BSN

## 2018-06-20 NOTE — Progress Notes (Signed)
Internal Medicine Clinic Attending  Case discussed with Dr. Danford Bad at the time of the visit.  We reviewed the resident's history and exam and pertinent patient test results.  I agree with the assessment, diagnosis, and plan of care documented in the resident's note.  Here with persistent symptoms of asthma exacerbation. Not in distress, likely incomplete resolution, less likely pneumonia with no fevers and no focality on lung exam. CXR would be helpful, but she has not completed. Given her worsening asthma control, agree with consideration of addition of anti-histamine and montelukast for possible allergic component, consideration of pulmonary referral.   Lenice Pressman, M.D., Ph.D.

## 2018-06-20 NOTE — Telephone Encounter (Signed)
Patient returned call. Instructed patient to take paper Rx for prednisone to [facility]. Patient made aware of refills on inhalers sent to [facility] on 04/04/2018. She will contact them to send her meds. Hubbard Hartshorn, RN, BSN

## 2018-06-21 ENCOUNTER — Encounter (HOSPITAL_COMMUNITY): Payer: Self-pay | Admitting: *Deleted

## 2018-06-21 NOTE — Progress Notes (Signed)
Letter mailed to patient with negative pap smear results. HPV was negative. Next pap smear due in one year.

## 2018-06-28 ENCOUNTER — Ambulatory Visit: Payer: Self-pay

## 2018-07-27 ENCOUNTER — Encounter: Payer: Self-pay | Admitting: Internal Medicine

## 2018-11-11 ENCOUNTER — Other Ambulatory Visit: Payer: Self-pay

## 2018-11-11 ENCOUNTER — Emergency Department (HOSPITAL_COMMUNITY)
Admission: EM | Admit: 2018-11-11 | Discharge: 2018-11-12 | Disposition: A | Discharge status: Home / Self Care | Payer: Self-pay | Attending: Emergency Medicine | Admitting: Emergency Medicine

## 2018-11-11 ENCOUNTER — Encounter (HOSPITAL_COMMUNITY): Payer: Self-pay

## 2018-11-11 DIAGNOSIS — F17210 Nicotine dependence, cigarettes, uncomplicated: Secondary | ICD-10-CM | POA: Insufficient documentation

## 2018-11-11 DIAGNOSIS — Z7982 Long term (current) use of aspirin: Secondary | ICD-10-CM | POA: Insufficient documentation

## 2018-11-11 DIAGNOSIS — R51 Headache: Secondary | ICD-10-CM

## 2018-11-11 DIAGNOSIS — J45909 Unspecified asthma, uncomplicated: Secondary | ICD-10-CM | POA: Insufficient documentation

## 2018-11-11 DIAGNOSIS — M5412 Radiculopathy, cervical region: Secondary | ICD-10-CM | POA: Insufficient documentation

## 2018-11-11 DIAGNOSIS — R519 Headache, unspecified: Secondary | ICD-10-CM

## 2018-11-11 DIAGNOSIS — Z79899 Other long term (current) drug therapy: Secondary | ICD-10-CM | POA: Insufficient documentation

## 2018-11-11 LAB — BASIC METABOLIC PANEL
Anion gap: 8 (ref 5–15)
BUN: 27 mg/dL — AB (ref 6–20)
CALCIUM: 8.9 mg/dL (ref 8.9–10.3)
CO2: 26 mmol/L (ref 22–32)
CREATININE: 0.99 mg/dL (ref 0.44–1.00)
Chloride: 107 mmol/L (ref 98–111)
GFR calc Af Amer: >60 mL/min (ref >60)
GFR calc non Af Amer: >60 mL/min (ref >60)
Glucose, Bld: 81 mg/dL (ref 70–99)
Potassium: 3.4 mmol/L — ABNORMAL LOW (ref 3.5–5.1)
SODIUM: 141 mmol/L (ref 135–145)

## 2018-11-11 LAB — CBC
HCT: 42.9 % (ref 36.0–46.0)
Hemoglobin: 13.8 g/dL (ref 12.0–15.0)
MCH: 28.9 pg (ref 26.0–34.0)
MCHC: 32.2 g/dL (ref 30.0–36.0)
MCV: 89.7 fL (ref 80.0–100.0)
NRBC: 0 % (ref 0.0–0.2)
PLATELETS: 304 10*3/uL (ref 150–400)
RBC: 4.78 MIL/uL (ref 3.87–5.11)
RDW: 13.4 % (ref 11.5–15.5)
WBC: 8.7 10*3/uL (ref 4.0–10.5)

## 2018-11-11 LAB — I-STAT BETA HCG BLOOD, ED (MC, WL, AP ONLY): I-stat hCG, quantitative: <5 m[IU]/mL (ref <5)

## 2018-11-11 LAB — CBG MONITORING, ED: GLUCOSE-CAPILLARY: 90 mg/dL (ref 70–99)

## 2018-11-11 LAB — I-STAT TROPONIN, ED: Troponin i, poc: 0 ng/mL (ref 0.00–0.08)

## 2018-11-11 MED ORDER — METOCLOPRAMIDE HCL 5 MG/ML IJ SOLN
10.0000 mg | Freq: Once | INTRAMUSCULAR | Status: AC
  Administered 2018-11-11: 10 mg via INTRAVENOUS
  Filled 2018-11-11: qty 2

## 2018-11-11 MED ORDER — DIPHENHYDRAMINE HCL 50 MG/ML IJ SOLN
12.5000 mg | Freq: Once | INTRAMUSCULAR | Status: AC
  Administered 2018-11-11: 12.5 mg via INTRAVENOUS
  Filled 2018-11-11: qty 1

## 2018-11-11 MED ORDER — DEXAMETHASONE SODIUM PHOSPHATE 10 MG/ML IJ SOLN
10.0000 mg | Freq: Once | INTRAMUSCULAR | Status: AC
  Administered 2018-11-11: 10 mg via INTRAVENOUS
  Filled 2018-11-11: qty 1

## 2018-11-11 NOTE — ED Notes (Signed)
Patient complaining of weakness, dizziness, and numbness to the right shoulder down to her fingers. Patient also states she started having a headache. Patient is not having any other symptoms at this time.

## 2018-11-11 NOTE — ED Provider Notes (Signed)
[facility] DEPT Provider Note   CSN: [id number] Arrival date & time: 11/11/18  2143     History   Chief Complaint Chief Complaint  Patient presents with  . Headache  . Weakness    R     HPI Kathleen [name] is a 57 y.o. female.  Patient to ED with complaint of right arm pain, weakness and numbness that started tonight while lying on the couch watching TV about 1-2 hours ago. She reports headache, mild nausea. Per chart review she has a history of headaches. She also reports right sided neck pain that has been an ongoing issue for her but the arm symptoms are new tonight. No visual changes. No SOB, chest pain, cough, fever, LE symptoms, abdominal pain.   The history is provided by the patient. No language interpreter was used.  Headache   Associated symptoms include nausea. Pertinent negatives include no fever, no shortness of breath and no vomiting.  Weakness  Associated symptoms include headaches. Pertinent negatives include no shortness of breath, no chest pain and no vomiting.    Past Medical History:  Diagnosis Date  . Asthma    2 sets of PFT's in 04/09 without sign variability. Last set with significant decrease in FEV1 with saline alone, suggesting Asthma but  recommended clinical corelation   . Asthma   . Bipolar 1 disorder [facility])    therapist is Caryl Asp and is followed by [employer]  . Blackout    negative work up including ESR, ANA, opthalmology referral, carotid dopplers, 2D echo, MRI and EEG.  . 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 Bronchitis   . Chronic headache   . Chronic pain    normal work up including TSH, RPR, B12, HIV, plain films, 2 ESR's, ANA, CK, RF along with routine CBC, CMET and UA. Further work up includes CRP, ESR, SPEP/UPEP, hepatitis erology, A1C , repeat ANA  . DUB (dysfunctional uterine bleeding)    and pelvic pain, with negative endometrial bx in 07/09 and  transvaginal U/S significant for mild fibroids in 0/09.  Marland Kitchen GERD (gastroesophageal reflux disease)   . Lower extremity edema    Neg ABI's, normal 2D echo, normal albumin  . Menorrhagia   . Ovarian cyst   . Polysubstance abuse ([place])    none since March 17,2009.  Marland Kitchen Sleep apnea    NO CPAP  . Tubular adenoma of colon     Patient Active Problem List   Diagnosis Date Noted  . Vertigo 01/11/2018  . Odynophagia 08/10/2016  . Lumbar radiculopathy 07/26/2016  . Wound infection 07/20/2016  . Left hip pain 02/05/2016  . Depression 09/19/2015  . Hot flashes 02/05/2015  . Flashing lights seen 02/05/2015  . Left knee pain 06/18/2014  . Screening for breast cancer 06/18/2014  . Menorrhagia 05/24/2014  . Dyspareunia 05/24/2014  . Breast lump in female and tenderness 05/24/2014  . Protein-calorie malnutrition, severe ([place]) 05/15/2014  . Asthma 05/13/2014  . Neck pain on left side 01/19/2012  . ABNORMAL VAGINAL BLEEDING 12/21/2010  . 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 BIPOLAR DISORDER UNSPECIFIED 01/21/2010  . GERD 01/21/2010  . ARTHRITIS, GENERALIZED 10/08/2008  . SNORING 10/08/2008  . DISTURBANCE OF SKIN SENSATION 08/29/2008  . Chronic low back pain 08/08/2008  . INSOMNIA 05/31/2008  . VISION LOSS, SUDDEN 03/25/2008  . BREAST LUMP 03/25/2008  . LEG PAIN, BILATERAL 03/19/2008  . HALLUCINATIONS 03/18/2008  .  GENERALIZED ANXIETY DISORDER 03/14/2008  . DRUG ABUSE 03/14/2008  . MIGRAINE VARIANT 03/14/2008    Past Surgical History:  Procedure Laterality Date  . CESAREAN SECTION    . CESAREAN SECTION    . HERNIA REPAIR    . Left partial oophorectomy    . OOPHORECTOMY     1/2 ovary removed  . VAGINA SURGERY     mesh     OB History    Gravida  3   Para  2   Term  2   Preterm  0   AB  1   Living  2     SAB  0   TAB  1   Ectopic  0   Multiple      Live Births  2            Home Medications    Prior to Admission medications   Medication Sig Start  Date End Date Taking? Authorizing Provider  acetaminophen (TYLENOL) 500 MG tablet Take 1,000 mg by mouth every 6 (six) hours as needed for moderate pain.   Yes [provider]  albuterol (PROVENTIL HFA;VENTOLIN HFA) 108 (90 Base) MCG/ACT inhaler Inhale 2 puffs into the lungs every 4 (four) hours as needed for wheezing or shortness of breath. 04/04/18  Yes Colbert Ewing, MD  albuterol (PROVENTIL) (2.5 MG/3ML) 0.083% nebulizer solution Take 3 mLs (2.5 mg total) by nebulization every 6 (six) hours as needed for wheezing or shortness of breath. 05/29/18  Yes Lacroce, Hulen Shouts, MD  lidocaine (LIDODERM) 5 % Place 1 patch onto the skin daily. Remove & Discard patch within 12 hours or as directed by MD   Yes [provider]  aspirin EC 81 MG tablet Take 1 tablet (81 mg total) by mouth daily. Patient not taking: Reported on 11/11/2018 04/04/18   Colbert Ewing, MD  benzonatate (TESSALON) 100 MG capsule Take 1 capsule (100 mg total) by mouth every 8 (eight) hours. Patient not taking: Reported on 11/11/2018 05/25/18   Jeannett Senior, PA-C  loratadine (CLARITIN) 10 MG tablet Take 1 tablet (10 mg total) by mouth daily. 04/04/18 07/03/18  Colbert Ewing, MD  mometasone-formoterol (DULERA) 200-5 MCG/ACT AERO Inhale 1 puff into the lungs 2 (two) times daily. Patient taking differently: Inhale 1 puff into the lungs 2 (two) times daily as needed for wheezing or shortness of breath.  04/04/18   Colbert Ewing, MD  montelukast (SINGULAIR) 10 MG tablet Take 1 tablet (10 mg total) by mouth at bedtime. Patient taking differently: Take 10 mg by mouth at bedtime as needed (allergies).  06/16/18   Molt, Bethany, DO  omeprazole (PRILOSEC) 20 MG capsule Take 1 capsule (20 mg total) by mouth daily. Patient not taking: Reported on 11/11/2018 02/28/18 02/28/19  Lorella Nimrod, MD  ondansetron (ZOFRAN ODT) 8 MG disintegrating tablet Take 1 tablet (8 mg total) by mouth every 8 (eight) hours as needed for nausea or  vomiting. Patient not taking: Reported on 11/11/2018 05/25/18   Jeannett Senior, PA-C  oxyCODONE (OXY IR/ROXICODONE) 5 MG immediate release tablet Take 5 mg by mouth every 4 (four) hours as needed for severe pain.    [provider]  sertraline (ZOLOFT) 50 MG tablet Take 1 tablet (50 mg total) by mouth daily. 04/04/18 10/01/18  Colbert Ewing, MD    Family History Family History  Problem Relation Age of Onset  . Colon cancer Mother 5  . Breast cancer Mother 47  . Diabetes Father   . Hypertension Father   .  Kidney disease Father   . Colon cancer Father 35  . Prostate cancer Father   . Cancer Father   . Kidney disease Sister   . Cancer Brother   . Breast cancer Maternal Grandmother 19    Social History Social History   Tobacco Use  . Smoking status: Current Every Day Smoker    Packs/day: 0.25    Years: 30.00    Pack years: 7.50    Types: Cigarettes  . Smokeless tobacco: Never Used  Substance Use Topics  . Alcohol use: Yes    Alcohol/week: 0.0 standard drinks    Comment: socailly  . Drug use: No    Types: Marijuana    Comment: former     Allergies   Topiramate and Tramadol   Review of Systems Review of Systems  Constitutional: Negative for chills and fever.  HENT: Negative.   Eyes: Negative for visual disturbance.  Respiratory: Negative.  Negative for shortness of breath.   Cardiovascular: Negative.  Negative for chest pain.  Gastrointestinal: Positive for nausea. Negative for abdominal pain and vomiting.  Musculoskeletal: Positive for neck pain.  Skin: Negative.   Neurological: Positive for weakness, numbness and headaches.     Physical Exam Updated Vital Signs BP 105/78   Pulse 74   Temp 97.9 F (36.6 C) (Oral)   Resp (!) 22   Ht '5\' 3"'  (1.6 m)   Wt 64.9 kg   LMP 11/08/2014   SpO2 98%   BMI 25.33 kg/m   Physical Exam  Constitutional: She is oriented to person, place, and time. She appears well-developed and well-nourished.  HENT:   Head: Normocephalic and atraumatic.  Eyes: Pupils are equal, round, and reactive to light. EOM are normal.  Neck: Normal range of motion. Neck supple.  Cardiovascular: Normal rate and regular rhythm.  No murmur heard. Pulmonary/Chest: Effort normal and breath sounds normal. She has no wheezes. She has no rales. She exhibits no tenderness.  Abdominal: Soft. Bowel sounds are normal. There is no tenderness. There is no rebound and no guarding.  Musculoskeletal: Normal range of motion.  Right paracervical tenderness.  Neurological: She is alert and oriented to person, place, and time. She displays normal reflexes (Upper and lower extremities). Coordination and gait normal. GCS eye subscore is 4. GCS verbal subscore is 5. GCS motor subscore is 6.  4/5 grip strength right upper extremity.  Skin: Skin is warm and dry. No rash noted.  Psychiatric: She has a normal mood and affect.     ED Treatments / Results  Labs (all labs ordered are listed, but only abnormal results are displayed) Labs Reviewed  BASIC METABOLIC PANEL - Abnormal; Notable for the following components:      Result Value   Potassium 3.4 (*)    BUN 27 (*)    All other components within normal limits  CBC  CBG MONITORING, ED  I-STAT BETA HCG BLOOD, ED ([facility], [facility], [facility] ONLY)  I-STAT TROPONIN, ED    EKG EKG Interpretation  Date/Time:  Saturday November 11 2018 22:02:48 EST Ventricular Rate:  67 PR Interval:    QRS Duration: 91 QT Interval:  454 QTC Calculation: 480 R Axis:   -21 Text Interpretation:  Sinus rhythm Borderline left axis deviation Abnormal R-wave progression, early transition Baseline wander in lead(s) V3 Confirmed by Veryl Speak [phone]) on 11/11/2018 10:10:33 PM   Radiology No results found.  Procedures Procedures (including critical care time)  Medications Ordered in ED Medications  metoCLOPramide (REGLAN) injection 10 mg (  10 mg Intravenous Given 11/11/18 2308)  diphenhydrAMINE (BENADRYL)  injection 12.5 mg (12.5 mg Intravenous Given 11/11/18 2308)  dexamethasone (DECADRON) injection 10 mg (10 mg Intravenous Given 11/11/18 2308)     Initial Impression / Assessment and Plan / ED Course  I have reviewed the triage vital signs and the nursing notes.  Pertinent labs & imaging results that were available during my care of the patient were reviewed by me and considered in my medical decision making (see chart for details).     Patient to ED with complaint of right UE weakness, numbness that started earlier this evening. Also complains of headache, right sided, associated with nausea. No visual changes. History of headache.   Neurologic exam is significant for right UE grip weakness. Normal reflexes. Otherwise, normal neurologic exam. She has ongoing right neck pain but has not reported radicular symptoms in the past. Doubt CVA, more likely cervical radiculopathy. Will schedule for outpatient MRI and refer back to PCP for review and appropriate referrals for any indicated care.   Patient medicated with migraine cocktail. On re-evaluation, headache pain is resolved. Patient lying in bed with right arm fully extended and lying across her pillow comfortably. VSS.   Husband at bedside. She can be discharged home in the care of spouse. REcommend PCP follow up for MR review, which will be scheduled for Monday am.    Final Clinical Impressions(s) / ED Diagnoses   Final diagnoses:  None   1. Cervical radiculopathy 2. Headache   ED Discharge Orders    None       Charlann Lange, PA-C 11/11/18 2356    Veryl Speak, MD 11/12/18 1556

## 2018-11-11 NOTE — ED Triage Notes (Addendum)
Pt reports headache, R sided arm pain that started behind her shoulder, R sided weakness, and dizziness that started about 30 mins-1 hour ago. She reports that the area under her shoulder is swollen as well. She states that if she tries to stand up, she gets dizzy and falls down. No facial droop noted. No slurred speech noted.

## 2018-11-11 NOTE — ED Notes (Signed)
Requested urine from patient. 

## 2018-11-12 NOTE — Discharge Instructions (Addendum)
Call your doctor for an appointment to review your MR results later this coming week.

## 2018-12-27 ENCOUNTER — Telehealth: Payer: Self-pay | Admitting: Internal Medicine

## 2018-12-27 NOTE — Telephone Encounter (Signed)
Pls contact pt [phone] nebulizer is broken having problems with her asthma

## 2018-12-28 ENCOUNTER — Encounter: Payer: Self-pay | Admitting: Internal Medicine

## 2018-12-28 ENCOUNTER — Ambulatory Visit: Payer: Self-pay | Admitting: Licensed Clinical Social Worker

## 2018-12-28 ENCOUNTER — Telehealth: Payer: Self-pay | Admitting: *Deleted

## 2018-12-28 ENCOUNTER — Ambulatory Visit (INDEPENDENT_AMBULATORY_CARE_PROVIDER_SITE_OTHER): Payer: Self-pay | Admitting: Internal Medicine

## 2018-12-28 VITALS — BP 119/88 | HR 86 | Temp 98.0°F | Wt 146.2 lb

## 2018-12-28 DIAGNOSIS — F3160 Bipolar disorder, current episode mixed, unspecified: Secondary | ICD-10-CM

## 2018-12-28 DIAGNOSIS — Z7951 Long term (current) use of inhaled steroids: Secondary | ICD-10-CM

## 2018-12-28 DIAGNOSIS — F32A Depression, unspecified: Secondary | ICD-10-CM

## 2018-12-28 DIAGNOSIS — F329 Major depressive disorder, single episode, unspecified: Secondary | ICD-10-CM

## 2018-12-28 DIAGNOSIS — F319 Bipolar disorder, unspecified: Secondary | ICD-10-CM

## 2018-12-28 DIAGNOSIS — J45909 Unspecified asthma, uncomplicated: Secondary | ICD-10-CM

## 2018-12-28 DIAGNOSIS — J45901 Unspecified asthma with (acute) exacerbation: Secondary | ICD-10-CM

## 2018-12-28 DIAGNOSIS — Z79899 Other long term (current) drug therapy: Secondary | ICD-10-CM

## 2018-12-28 DIAGNOSIS — K219 Gastro-esophageal reflux disease without esophagitis: Secondary | ICD-10-CM

## 2018-12-28 DIAGNOSIS — H539 Unspecified visual disturbance: Secondary | ICD-10-CM

## 2018-12-28 DIAGNOSIS — H53123 Transient visual loss, bilateral: Secondary | ICD-10-CM

## 2018-12-28 MED ORDER — OMEPRAZOLE 20 MG PO CPDR: 20.0000 mg | DELAYED_RELEASE_CAPSULE | Freq: Every day | ORAL | 3 refills | Status: DC

## 2018-12-28 MED ORDER — ALBUTEROL SULFATE HFA 108 (90 BASE) MCG/ACT IN AERS: 2.0000 | INHALATION_SPRAY | RESPIRATORY_TRACT | 6 refills | Status: DC | PRN

## 2018-12-28 MED ORDER — MONTELUKAST SODIUM 10 MG PO TABS: 10.0000 mg | ORAL_TABLET | Freq: Every day | ORAL | 1 refills | Status: DC

## 2018-12-28 MED ORDER — SERTRALINE HCL 25 MG PO TABS: 25.0000 mg | ORAL_TABLET | Freq: Every day | ORAL | 1 refills | Status: DC

## 2018-12-28 MED ORDER — MOMETASONE FURO-FORMOTEROL FUM 200-5 MCG/ACT IN AERO: 1.0000 | INHALATION_SPRAY | Freq: Two times a day (BID) | RESPIRATORY_TRACT | 11 refills | Status: DC

## 2018-12-28 MED ORDER — MONTELUKAST SODIUM 10 MG PO TABS: 10.0000 mg | ORAL_TABLET | Freq: Every day | ORAL | 0 refills | Status: DC

## 2018-12-28 MED ORDER — ALBUTEROL SULFATE (2.5 MG/3ML) 0.083% IN NEBU: 2.5000 mg | INHALATION_SOLUTION | Freq: Four times a day (QID) | RESPIRATORY_TRACT | 2 refills | Status: DC | PRN

## 2018-12-28 NOTE — Patient Instructions (Addendum)
FOLLOW-UP INSTRUCTIONS When: Please stop at the front desk and schedule an appointment with Dr. Tarri Abernethy for his next available opening for all of your routine needs and follow-up of your current medical concerns What to bring: All of your medications  I have refilled your asthma medications today. I have stopped the sertraline for now given your history of bipolar disorder, anxiety and depression.  I have placed the referral to ophthalmology for your eyes as well.  I have sent a script for Singular to the [facility] outpatient pharmacy. This is located on the corner of [address]. Please turn into the [place] entrance on [address] and drive around the corner. It is located on the bottom floor by the large open parking lot.   As always if your symptoms worsen, fail to improve, or you develop other concerning symptoms, please notify our office or visit the local ER if we are unavailable. Symptoms including shortness of breath, wheezing, chest pain, should not be ignored and should encourage you to visit the ED if we are unavailable by phone or the symptoms are severe.  Thank you for your visit to the [facility] today. If you have any questions or concerns please call us at [phone].

## 2018-12-28 NOTE — Assessment & Plan Note (Signed)
  Bipolar disorder unspecified: The patient has a history of bipolar disorder unspecified, depression, and anxiety as per chart review.  Patient stated that she had visited with psychiatry as recently as 2016 and was informed that she had what was consistent with manic episodes being bipolar.  However, she insists that she was prescribed sertraline for this disorder which she endorses has provide her with benefit.  She describes episodes of insomnia where she is awake for 3 to 4 days at a time, flight of ideas, spending sprees, irregular Moody behavior this consistent with possible manic episodes.  As this is outside the level of my expertise I feel that a referral to psychiatry is necessary.  However, she is resistant to seeing psychiatry offhand was 1 to speak with someone.  Therefore, we will refer her to our in-house behavioral health specialist to assist [place] with this transition.  Plan:  Discontinue sertraline Referral to [facility], Dessie Coma placed. We appreciate their assistance with [name]

## 2018-12-28 NOTE — Assessment & Plan Note (Signed)
  Asthma: Patient endorses increased wheezing since running out of her asthma medications ~2 wks prior.  She denies notable shortness of breath, cough, chest pain, or congestion.  In addition, her nebulizer has stopped working.  She is able to speak in full sentences without pausing, discussed her conditions without dyspnea and with preserved oxygen sats. Her pulse and blood pressure are within normal limits. She does not appear to be in an acute asthma exacerbation at this time. I do not have a home peak expiratory flow volume to compare.   Plan: Dr. Maudie Mercury has agreed to assist the patient with completing the [facility] application for her asthma medications. I have provided her with PRN Ventolin as well as a Breo (inleu of Dulera) inhaler today Given a one-month prescription for Singulair to the [facility] outpatient pharmacy I have sent prescriptions for Singulair, Dulera, albuterol inhaler and nebulizer solution, to [facility] for long-term medications.
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
## 2018-12-28 NOTE — Assessment & Plan Note (Signed)
Will hold sertraline and have her meet with [facility] given her comorbid conditions.

## 2018-12-28 NOTE — Progress Notes (Signed)
Case discussed with Dr. Berline Lopes at the time of the visit.  We reviewed the resident's history and exam and pertinent patient test results.  I agree with the assessment, diagnosis and plan of care documented in the resident's note.  The 8-9 years of bilateral vision loss is unusual and the only possible organic cause may be transient basilar artery occlusion, but more serious neurologic sequelae would be expected over that time frame, making this unlikely.  As she appears to be hypomanic now and has a history of bipolar disorder will refrain from SSRI initiation and refer to behavioral health.

## 2018-12-28 NOTE — Telephone Encounter (Signed)
Pt would like to get a new nebulizer machine, please call pt back.

## 2018-12-28 NOTE — Telephone Encounter (Signed)
Pt calls c/o asthma flare x 1 week, can hardly speak, REFUSES ED after she c/o "I can hardly breathe but I wont go to the emergency room". States she has been trying to "wait it out" but is worse, appt [facility] for this pm, she is cautioned if she begins to feel worse to call 911 or go to ED, she is agreeable.

## 2018-12-28 NOTE — Assessment & Plan Note (Signed)
Refilled omeprazole.

## 2018-12-28 NOTE — Telephone Encounter (Signed)
Thank you. We will see her.

## 2018-12-28 NOTE — Assessment & Plan Note (Signed)
Transient vision loss: The patient endorses a 7 to 8-year history of bilateral complete vision loss at times.  She denies that this is similar to a curtain closing over her eyes and that it is sudden in onset lasting 20 to 40 seconds prior to spontaneous resolution, has no known trigger, does not improve with any known medication. She denied that this has worsened over time, as no other known association. Has not undergone a recent eye exam. Denied other vision changes. Included in the differential were several processes known to cause unilateral vision changes, bilateral loss may be associated with basilar artery occlusion or bilateral GCA but with this long standing intermittent nature I am uncertain. She denied jaw claudication, denied corresponding headaches endorsing only intermittent random headaches.   Plan:  Will refer to ophthalmology. Would consider a ESR but given the chronicity and stability I did not feel it was indicated today.  Also, consider MRI 

## 2018-12-29 NOTE — Telephone Encounter (Signed)
Spoke with patient. She is on her way to [facility] store on [address] with broken nebulizer as well as Rx for new nebulizer given at yesterday's [facility] visit. She will notify us if she has any difficulty obtaining new machine. Hubbard Hartshorn, RN, BSN

## 2019-01-02 NOTE — BH Specialist Note (Signed)
error 

## 2019-01-04 ENCOUNTER — Other Ambulatory Visit: Payer: Self-pay

## 2019-01-04 ENCOUNTER — Ambulatory Visit (INDEPENDENT_AMBULATORY_CARE_PROVIDER_SITE_OTHER): Payer: Self-pay | Admitting: Internal Medicine

## 2019-01-04 ENCOUNTER — Encounter: Payer: Self-pay | Admitting: Internal Medicine

## 2019-01-04 ENCOUNTER — Ambulatory Visit: Payer: Self-pay | Admitting: Licensed Clinical Social Worker

## 2019-01-04 ENCOUNTER — Encounter: Payer: Self-pay | Admitting: Licensed Clinical Social Worker

## 2019-01-04 VITALS — BP 129/85 | HR 92 | Temp 98.2°F | Ht 62.0 in | Wt 146.1 lb

## 2019-01-04 DIAGNOSIS — G43809 Other migraine, not intractable, without status migrainosus: Secondary | ICD-10-CM

## 2019-01-04 DIAGNOSIS — F3110 Bipolar disorder, current episode manic without psychotic features, unspecified: Secondary | ICD-10-CM

## 2019-01-04 DIAGNOSIS — F3160 Bipolar disorder, current episode mixed, unspecified: Secondary | ICD-10-CM

## 2019-01-04 DIAGNOSIS — Z79899 Other long term (current) drug therapy: Secondary | ICD-10-CM

## 2019-01-04 DIAGNOSIS — J45909 Unspecified asthma, uncomplicated: Secondary | ICD-10-CM

## 2019-01-04 DIAGNOSIS — J45901 Unspecified asthma with (acute) exacerbation: Secondary | ICD-10-CM

## 2019-01-04 DIAGNOSIS — F312 Bipolar disorder, current episode manic severe with psychotic features: Secondary | ICD-10-CM
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
## 2019-01-04 NOTE — Patient Instructions (Signed)
Thank you for allowing [place] to provide your care. We are working to get your to the opthomologist. Please follow-up with [employer] tomorrow. Please come back in 4 weeks.

## 2019-01-04 NOTE — Progress Notes (Signed)
   CC: F/U visual changes and bipolar  HPI:  Kathleen [name] is a 58 y.o. female with PMHx listed below presenting for follow-up on her visual changes and bipolar. Please see the A&P for the status of the patient's chronic medical problems.  Past Medical History:  Diagnosis Date  . Asthma    2 sets of PFT's in 04/09 without sign variability. Last set with significant decrease in FEV1 with saline alone, suggesting Asthma but  recommended clinical corelation   . Asthma   . Bipolar 1 disorder [facility])    therapist is Caryl Asp and is followed by [employer]  . Blackout    negative work up including ESR, ANA, opthalmology referral, carotid dopplers, 2D echo, MRI and EEG.  . 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 Bronchitis   . Chronic headache   . Chronic pain    normal work up including TSH, RPR, B12, HIV, plain films, 2 ESR's, ANA, CK, RF along with routine CBC, CMET and UA. Further work up includes CRP, ESR, SPEP/UPEP, hepatitis erology, A1C , repeat ANA  . DUB (dysfunctional uterine bleeding)    and pelvic pain, with negative endometrial bx in 07/09 and transvaginal U/S significant for mild fibroids in 0/09.  Marland Kitchen GERD (gastroesophageal reflux disease)   . Lower extremity edema    Neg ABI's, normal 2D echo, normal albumin  . Menorrhagia   . Ovarian cyst   . Polysubstance abuse ([place])    none since March 17,2009.  Marland Kitchen Sleep apnea    NO CPAP  . Tubular adenoma of colon    Review of Systems:  Performed and all others negative.  Physical Exam: Vitals:   01/04/19 1505  BP: 129/85  Pulse: 92  Temp: 98.2 F (36.8 C)  TempSrc: Oral  SpO2: 99%  Weight: 146 lb 1.6 oz (66.3 kg)  Height: _0  (1.575 m)   Gen: Well nourished female in no acute distress HENT: Unable to read my name tage from 5 feet away  Psych: Obvious manic behavior with grandiose ideas, tangential thought process, and pressured speech.  Assessment & Plan:   See  Encounters Tab for problem based charting.  Patient discussed with Dr. Evette Doffing

## 2019-01-05 ENCOUNTER — Other Ambulatory Visit: Payer: Self-pay | Admitting: Internal Medicine

## 2019-01-05 MED ORDER — ALBUTEROL SULFATE HFA 108 (90 BASE) MCG/ACT IN AERS: 2.0000 | INHALATION_SPRAY | RESPIRATORY_TRACT | 6 refills | Status: DC | PRN

## 2019-01-05 NOTE — Telephone Encounter (Signed)
Needs refill on loratadine (CLARITIN) 10 MG tablet(Expired) montelukast (SINGULAIR) 10 MG tablet  ondansetron (ZOFRAN ODT) 8 MG disintegrating tablet [facility] [place] [place] [facility] [place] ;pt contact [phone]

## 2019-01-06 ENCOUNTER — Encounter: Payer: Self-pay | Admitting: Internal Medicine

## 2019-01-06 NOTE — Assessment & Plan Note (Signed)
HPI: Patient with asthma on albuterol, dual air, and montelukast. She states that she takes all her medications consistently. No concerns of an asthma flare today. She denies wheezing, cough, congestion.  A/P: Appears to be well controlled today. Will continue current medical management.

## 2019-01-06 NOTE — Assessment & Plan Note (Signed)
HPI:  Patient presented to the clinic with concerns of transient vision loss. She tells me that occasionally she loses vision in her eyes. It does not seem to be both eyes at the same time. It seems to be intermittent. She denies any jaw claudication or pain with eye movements. She denies any other neurological symptoms.  A/P: Patient was urgently sent to ophthalmology to rule out more serious pathology like retinal detachment. Upon further chart review it appears that this is a chronic issue dating back to 2012. At that point she had a fairly extensive workup including an MRI, EEG, echo, carotid Doppler's, evaluation by ophthalmology, and blood work including and ANA and ESR.  She may benefit from a neurology referral to discuss treatments for these migraine variants. I will do further investigation determine if standard migraine prophylaxis would be of benefit.

## 2019-01-06 NOTE — Assessment & Plan Note (Signed)
HPI: Patient with known bipolar disorder. She is not on any mood stabilizing medication. She has [facility]. She states that she has not slept in the past 3 to 4 days. Continues to have grandiose delusions, tangential thought process, and pressured speech.  A/P:  Patient currently not suicidal. Encouraged her to keep her appointment with [facility] tomorrow. She would benefit from mood stabilizing therapy. I do not feel that she needs admitted to the hospital at this point as she is not a danger to herself or others.

## 2019-01-07 MED ORDER — ONDANSETRON 8 MG PO TBDP: 8.0000 mg | ORAL_TABLET | Freq: Three times a day (TID) | ORAL | 0 refills | Status: DC | PRN

## 2019-01-07 MED ORDER — LORATADINE 10 MG PO TABS: 10.0000 mg | ORAL_TABLET | Freq: Every day | ORAL | 0 refills | Status: DC

## 2019-01-08 ENCOUNTER — Other Ambulatory Visit: Payer: Self-pay | Admitting: Internal Medicine

## 2019-01-08 DIAGNOSIS — J45901 Unspecified asthma with (acute) exacerbation: Secondary | ICD-10-CM

## 2019-01-08 NOTE — Progress Notes (Signed)
Internal Medicine Clinic Attending  Case discussed with Dr. Helberg at the time of the visit.  We reviewed the resident's history and exam and pertinent patient test results.  I agree with the assessment, diagnosis, and plan of care documented in the resident's note.    

## 2019-01-10 ENCOUNTER — Other Ambulatory Visit: Payer: Self-pay | Admitting: *Deleted

## 2019-01-10 ENCOUNTER — Ambulatory Visit: Payer: Self-pay

## 2019-01-10 ENCOUNTER — Ambulatory Visit: Payer: Self-pay | Admitting: Licensed Clinical Social Worker

## 2019-01-10 ENCOUNTER — Encounter: Payer: Self-pay | Admitting: *Deleted

## 2019-01-10 MED ORDER — ASPIRIN EC 81 MG PO TBEC: 81.0000 mg | DELAYED_RELEASE_TABLET | Freq: Every day | ORAL | 1 refills | Status: DC

## 2019-01-12 ENCOUNTER — Other Ambulatory Visit: Payer: Self-pay | Admitting: Internal Medicine

## 2019-01-12 DIAGNOSIS — J45901 Unspecified asthma with (acute) exacerbation: Secondary | ICD-10-CM

## 2019-01-12 MED ORDER — ALBUTEROL SULFATE HFA 108 (90 BASE) MCG/ACT IN AERS: 2.0000 | INHALATION_SPRAY | RESPIRATORY_TRACT | 6 refills | Status: DC | PRN

## 2019-01-12 MED ORDER — MOMETASONE FURO-FORMOTEROL FUM 200-5 MCG/ACT IN AERO: 1.0000 | INHALATION_SPRAY | Freq: Two times a day (BID) | RESPIRATORY_TRACT | 11 refills | Status: DC

## 2019-01-12 NOTE — Telephone Encounter (Signed)
Pt is out of town and needs an inhaler really bad, she thinks she will need more than one, need refill on albuterol (PROVENTIL HFA;VENTOLIN HFA) 108 (90 Base) MCG/ACT inhaler, prednisone, please send to [place]; pt contact [phone]

## 2019-01-15 ENCOUNTER — Other Ambulatory Visit: Payer: Self-pay | Admitting: Pharmacist

## 2019-01-15 DIAGNOSIS — J45901 Unspecified asthma with (acute) exacerbation: Secondary | ICD-10-CM

## 2019-01-15 DIAGNOSIS — I82890 Acute embolism and thrombosis of other specified veins: Secondary | ICD-10-CM

## 2019-01-15 MED ORDER — MOMETASONE FURO-FORMOTEROL FUM 200-5 MCG/ACT IN AERO: 1.0000 | INHALATION_SPRAY | Freq: Two times a day (BID) | RESPIRATORY_TRACT | 3 refills | Status: DC

## 2019-01-15 MED ORDER — LORATADINE 10 MG PO TABS: 10.0000 mg | ORAL_TABLET | Freq: Every day | ORAL | 3 refills | Status: DC

## 2019-01-15 MED ORDER — ALBUTEROL SULFATE HFA 108 (90 BASE) MCG/ACT IN AERS: 2.0000 | INHALATION_SPRAY | RESPIRATORY_TRACT | 6 refills | Status: DC | PRN

## 2019-01-15 MED ORDER — ASPIRIN EC 81 MG PO TBEC: 81.0000 mg | DELAYED_RELEASE_TABLET | Freq: Every day | ORAL | 1 refills | Status: DC

## 2019-01-15 NOTE — Progress Notes (Signed)
Patient was approved for free medication access through [facility] pharmacy. Transferred prescriptions.

## 2019-01-31 ENCOUNTER — Ambulatory Visit: Payer: Self-pay

## 2019-02-07 NOTE — Addendum Note (Signed)
Addended by: Hulan Fray on: 02/07/2019 07:09 PM   Modules accepted: Orders

## 2019-02-20 ENCOUNTER — Ambulatory Visit: Payer: Self-pay | Admitting: Licensed Clinical Social Worker

## 2019-02-27 ENCOUNTER — Telehealth: Payer: Self-pay | Admitting: Licensed Clinical Social Worker

## 2019-02-27 ENCOUNTER — Emergency Department (HOSPITAL_COMMUNITY): Payer: Self-pay

## 2019-02-27 ENCOUNTER — Encounter (HOSPITAL_COMMUNITY): Payer: Self-pay | Admitting: Emergency Medicine

## 2019-02-27 ENCOUNTER — Other Ambulatory Visit: Payer: Self-pay

## 2019-02-27 ENCOUNTER — Emergency Department (HOSPITAL_COMMUNITY)
Admission: EM | Admit: 2019-02-27 | Discharge: 2019-02-27 | Discharge status: Against Medical Advice | Payer: Self-pay | Attending: Emergency Medicine | Admitting: Emergency Medicine

## 2019-02-27 ENCOUNTER — Ambulatory Visit: Payer: Self-pay | Admitting: Licensed Clinical Social Worker

## 2019-02-27 DIAGNOSIS — R350 Frequency of micturition: Secondary | ICD-10-CM | POA: Insufficient documentation

## 2019-02-27 DIAGNOSIS — Z5329 Procedure and treatment not carried out because of patient's decision for other reasons: Secondary | ICD-10-CM | POA: Insufficient documentation

## 2019-02-27 DIAGNOSIS — Z87891 Personal history of nicotine dependence: Secondary | ICD-10-CM | POA: Insufficient documentation

## 2019-02-27 DIAGNOSIS — R600 Localized edema: Secondary | ICD-10-CM | POA: Insufficient documentation

## 2019-02-27 DIAGNOSIS — Z79899 Other long term (current) drug therapy: Secondary | ICD-10-CM | POA: Insufficient documentation

## 2019-02-27 DIAGNOSIS — Z7982 Long term (current) use of aspirin: Secondary | ICD-10-CM | POA: Insufficient documentation

## 2019-02-27 DIAGNOSIS — J4541 Moderate persistent asthma with (acute) exacerbation: Secondary | ICD-10-CM | POA: Insufficient documentation

## 2019-02-27 DIAGNOSIS — Z20828 Contact with and (suspected) exposure to other viral communicable diseases: Secondary | ICD-10-CM | POA: Insufficient documentation

## 2019-02-27 HISTORY — DX: Chronic obstructive pulmonary disease, unspecified: J44.9

## 2019-02-27 LAB — CBC WITH DIFFERENTIAL/PLATELET
Abs Immature Granulocytes: 0.03 10*3/uL (ref 0.00–0.07)
Basophils Absolute: 0 10*3/uL (ref 0.0–0.1)
Basophils Relative: 0 %
Eosinophils Absolute: 0.2 10*3/uL (ref 0.0–0.5)
Eosinophils Relative: 3 %
HCT: 38.7 % (ref 36.0–46.0)
Hemoglobin: 12.1 g/dL (ref 12.0–15.0)
Immature Granulocytes: 0 %
Lymphocytes Relative: 27 %
Lymphs Abs: 2.2 10*3/uL (ref 0.7–4.0)
MCH: 28.2 pg (ref 26.0–34.0)
MCHC: 31.3 g/dL (ref 30.0–36.0)
MCV: 90.2 fL (ref 80.0–100.0)
MONOS PCT: 9 %
Monocytes Absolute: 0.7 10*3/uL (ref 0.1–1.0)
Neutro Abs: 4.7 10*3/uL (ref 1.7–7.7)
Neutrophils Relative %: 61 %
Platelets: 293 10*3/uL (ref 150–400)
RBC: 4.29 MIL/uL (ref 3.87–5.11)
RDW: 14.3 % (ref 11.5–15.5)
WBC: 7.9 10*3/uL (ref 4.0–10.5)
nRBC: 0 % (ref 0.0–0.2)

## 2019-02-27 LAB — URINALYSIS, ROUTINE W REFLEX MICROSCOPIC
Bilirubin Urine: NEGATIVE
Glucose, UA: NEGATIVE mg/dL
Hgb urine dipstick: NEGATIVE
Ketones, ur: NEGATIVE mg/dL
Leukocytes,Ua: NEGATIVE
Nitrite: NEGATIVE
PROTEIN: NEGATIVE mg/dL
Specific Gravity, Urine: 1.015 (ref 1.005–1.030)
pH: 7 (ref 5.0–8.0)

## 2019-02-27 LAB — COMPREHENSIVE METABOLIC PANEL
ALT: 27 U/L (ref 0–44)
AST: 29 U/L (ref 15–41)
Albumin: 4.1 g/dL (ref 3.5–5.0)
Alkaline Phosphatase: 73 U/L (ref 38–126)
Anion gap: 6 (ref 5–15)
BUN: 16 mg/dL (ref 6–20)
CALCIUM: 9 mg/dL (ref 8.9–10.3)
CO2: 26 mmol/L (ref 22–32)
Chloride: 106 mmol/L (ref 98–111)
Creatinine, Ser: 0.94 mg/dL (ref 0.44–1.00)
GFR calc Af Amer: >60 mL/min (ref >60)
GFR calc non Af Amer: >60 mL/min (ref >60)
Glucose, Bld: 93 mg/dL (ref 70–99)
Potassium: 4.2 mmol/L (ref 3.5–5.1)
Sodium: 138 mmol/L (ref 135–145)
Total Bilirubin: 0.7 mg/dL (ref 0.3–1.2)
Total Protein: 7.4 g/dL (ref 6.5–8.1)

## 2019-02-27 LAB — INFLUENZA PANEL BY PCR (TYPE A & B)
Influenza A By PCR: NEGATIVE
Influenza B By PCR: NEGATIVE

## 2019-02-27 LAB — LIPASE, BLOOD: Lipase: 40 U/L (ref 11–51)

## 2019-02-27 MED ORDER — IPRATROPIUM-ALBUTEROL 0.5-2.5 (3) MG/3ML IN SOLN
3.0000 mL | Freq: Once | RESPIRATORY_TRACT | Status: AC
  Administered 2019-02-27: 3 mL via RESPIRATORY_TRACT
  Filled 2019-02-27: qty 3

## 2019-02-27 MED ORDER — PREDNISONE 20 MG PO TABS
60.0000 mg | ORAL_TABLET | Freq: Once | ORAL | Status: AC
  Administered 2019-02-27: 60 mg via ORAL
  Filled 2019-02-27: qty 3

## 2019-02-27 MED ORDER — SODIUM CHLORIDE 0.9 % IV BOLUS
1000.0000 mL | Freq: Once | INTRAVENOUS | Status: AC
  Administered 2019-02-27: 1000 mL via INTRAVENOUS

## 2019-02-27 MED ORDER — ONDANSETRON HCL 4 MG/2ML IJ SOLN
4.0000 mg | Freq: Once | INTRAMUSCULAR | Status: AC
  Administered 2019-02-27: 4 mg via INTRAVENOUS
  Filled 2019-02-27: qty 2

## 2019-02-27 MED ORDER — ALBUTEROL SULFATE HFA 108 (90 BASE) MCG/ACT IN AERS
1.0000 | INHALATION_SPRAY | Freq: Once | RESPIRATORY_TRACT | Status: DC
  Filled 2019-02-27: qty 6.7

## 2019-02-27 MED ORDER — KETOROLAC TROMETHAMINE 15 MG/ML IJ SOLN
15.0000 mg | Freq: Once | INTRAMUSCULAR | Status: AC
  Administered 2019-02-27: 15 mg via INTRAVENOUS
  Filled 2019-02-27: qty 1

## 2019-02-27 MED ORDER — PREDNISONE 10 MG PO TABS: 40.0000 mg | ORAL_TABLET | Freq: Every day | ORAL | 0 refills | Status: AC

## 2019-02-27 MED ORDER — ALBUTEROL (5 MG/ML) CONTINUOUS INHALATION SOLN
10.0000 mg/h | INHALATION_SOLUTION | Freq: Once | RESPIRATORY_TRACT | Status: AC
  Administered 2019-02-27: 10 mg/h via RESPIRATORY_TRACT
  Filled 2019-02-27: qty 20

## 2019-02-27 MED ORDER — ONDANSETRON HCL 4 MG PO TABS: 4.0000 mg | ORAL_TABLET | Freq: Three times a day (TID) | ORAL | 0 refills | Status: DC | PRN

## 2019-02-27 NOTE — ED Notes (Signed)
Respiratory paged for breathing tx. 

## 2019-02-27 NOTE — ED Triage Notes (Signed)
Pt recently traveled to [facility] and [place].

## 2019-02-27 NOTE — ED Notes (Signed)
Pt declined discharge vitals. She received prescriptions and left AMA.

## 2019-02-27 NOTE — ED Provider Notes (Cosign Needed)
[facility] DEPT Provider Note   CSN: [id number] Arrival date & time: 02/27/19  1351    History   Chief Complaint Chief Complaint  Patient presents with  . Asthma  . Cough    HPI Kathleen [name] is a 58 y.o. female who presents with fever, cough, wheezing.  Past medical history significant for asthma/COPD, GERD, allergies.  The patient states that over the weekend she started to feel bad and run low-grade low-grade fevers of 100.  She tried to go to work this morning however was having shortness of breath, wheezing, and coughing.  She is having trouble getting the phlegm up and it gets stuck in her throat. She tried her inhaler and nebulizer without relief therefore she came to the ED.  She reports some central substernal chest tightness which she usually gets with her asthma exacerbations.  She also has had some abdominal pain which is sore from coughing, nausea, vomiting, diarrhea.  She reports urinary frequency.  She did travel to [place] and [place] 2 weeks ago.  She denies any known sick contacts. She states she usually gets a continuous albuterol tx and feels better.   HPI  Past Medical History:  Diagnosis Date  . Asthma    2 sets of PFT's in 04/09 without sign variability. Last set with significant decrease in FEV1 with saline alone, suggesting Asthma but  recommended clinical corelation   . Asthma   . Bipolar 1 disorder [facility])    therapist is Caryl Asp and is followed by [employer]  . Blackout    negative work up including ESR, ANA, opthalmology referral, carotid dopplers, 2D echo, MRI and EEG.  . BREAST LUMP 03/25/2008   Annotation: bilaterally Qualifier: Diagnosis of  By: Tomasa Hosteller MD, Edmon Crape.   . 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 Bronchitis   . Chronic headache   . Chronic low back pain 08/08/2008   Qualifier: Diagnosis of  By: Redmond Pulling  MD, Mateo Flow    . Chronic pain    normal  work up including TSH, RPR, B12, HIV, plain films, 2 ESR's, ANA, CK, RF along with routine CBC, CMET and UA. Further work up includes CRP, ESR, SPEP/UPEP, hepatitis erology, A1C , repeat ANA  . COPD (chronic obstructive pulmonary disease) ([place])   . DUB (dysfunctional uterine bleeding)    and pelvic pain, with negative endometrial bx in 07/09 and transvaginal U/S significant for mild fibroids in 0/09.  Marland Kitchen GERD (gastroesophageal reflux disease)   . Hallucinations 03/18/2008   Qualifier: Diagnosis of  By: Redmond Pulling  MD, Mateo Flow    . Lower extremity edema    Neg ABI's, normal 2D echo, normal albumin  . Menorrhagia   . Ovarian cyst   . Polysubstance abuse ([place])    none since March 17,2009.  Marland Kitchen Sleep apnea    NO CPAP  . Tubular adenoma of colon     Patient Active Problem List   Diagnosis Date Noted  . Lumbar radiculopathy 07/26/2016  . Depression 09/19/2015  . Hot flashes 02/05/2015  . Screening for breast cancer 06/18/2014  . Asthma 05/13/2014  . 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 BIPOLAR DISORDER UNSPECIFIED 01/21/2010  . GERD 01/21/2010  . ARTHRITIS, GENERALIZED 10/08/2008  . GENERALIZED ANXIETY DISORDER 03/14/2008  . DRUG ABUSE 03/14/2008  . Migraine variant 03/14/2008    Past Surgical History:  Procedure Laterality Date  . CESAREAN SECTION    .  CESAREAN SECTION    . HERNIA REPAIR    . Left partial oophorectomy    . OOPHORECTOMY     1/2 ovary removed  . VAGINA SURGERY     mesh     OB History    Gravida  3   Para  2   Term  2   Preterm  0   AB  1   Living  2     SAB  0   TAB  1   Ectopic  0   Multiple      Live Births  2            Home Medications    Prior to Admission medications   Medication Sig Start Date End Date Taking? Authorizing Provider  acetaminophen (TYLENOL) 325 MG tablet Take 975 mg by mouth every 6 (six) hours as needed for mild pain or headache.   Yes [provider]  albuterol (PROVENTIL HFA;VENTOLIN HFA) 108 (90  Base) MCG/ACT inhaler Inhale 2 puffs into the lungs every 4 (four) hours as needed for wheezing or shortness of breath. 01/15/19  Yes Helberg, Larkin Ina, MD  albuterol (PROVENTIL) (2.5 MG/3ML) 0.083% nebulizer solution Take 3 mLs (2.5 mg total) by nebulization every 6 (six) hours as needed for wheezing or shortness of breath. 12/28/18  Yes Kathi Ludwig, MD  aspirin EC 81 MG tablet Take 1 tablet (81 mg total) by mouth daily. 01/15/19  Yes Helberg, Larkin Ina, MD  fluticasone (FLONASE) 50 MCG/ACT nasal spray Place 1 spray into both nostrils daily as needed for allergies or rhinitis.   Yes [provider]  lidocaine (LIDODERM) 5 % Place 1 patch onto the skin daily. Remove & Discard patch within 12 hours or as directed by MD   Yes [provider]  loratadine (CLARITIN) 10 MG tablet Take 1 tablet (10 mg total) by mouth daily. 01/15/19 04/15/19 Yes Helberg, Larkin Ina, MD  mometasone-formoterol (DULERA) 200-5 MCG/ACT AERO Inhale 1 puff into the lungs 2 (two) times daily. 01/15/19  Yes Helberg, Larkin Ina, MD  omeprazole (PRILOSEC) 20 MG capsule Take 1 capsule (20 mg total) by mouth daily. 12/28/18 12/28/19 Yes Kathi Ludwig, MD  montelukast (SINGULAIR) 10 MG tablet Take 1 tablet (10 mg total) by mouth at bedtime. Patient not taking: Reported on 02/27/2019 12/28/18   Kathi Ludwig, MD  ondansetron (ZOFRAN ODT) 8 MG disintegrating tablet Take 1 tablet (8 mg total) by mouth every 8 (eight) hours as needed for nausea or vomiting. Patient not taking: Reported on 02/27/2019 01/07/19   [name], MD    Family History Family History  Problem Relation Age of Onset  . Colon cancer Mother 101  . Breast cancer Mother 18  . Diabetes Father   . Hypertension Father   . Kidney disease Father   . Colon cancer Father 55  . Prostate cancer Father   . Cancer Father   . Kidney disease Sister   . Cancer Brother   . Breast cancer Maternal Grandmother 51    Social History Social History   Tobacco Use   . Smoking status: Former Smoker    Packs/day: 0.25    Years: 30.00    Pack years: 7.50    Types: Cigarettes    Last attempt to quit: 12/28/2016    Years since quitting: 2.1  . Smokeless tobacco: Never Used  Substance Use Topics  . Alcohol use: Yes    Alcohol/week: 0.0 standard drinks    Comment: socailly  . Drug use: No  Types: Marijuana    Comment: former     Allergies   Topiramate and Tramadol   Review of Systems Review of Systems  Constitutional: Positive for appetite change and fever.  Respiratory: Positive for cough, shortness of breath and wheezing.   Cardiovascular: Positive for chest pain. Negative for palpitations and leg swelling.  Gastrointestinal: Positive for abdominal pain, diarrhea, nausea and vomiting.  Genitourinary: Positive for frequency. Negative for dysuria.  All other systems reviewed and are negative.    Physical Exam Updated Vital Signs BP (!) 153/112 (BP Location: Left Arm)   Pulse (!) 108   Temp 99.2 F (37.3 C) (Oral)   Resp (!) 22   Ht '5\' 2"'  (1.575 m)   Wt 62.1 kg   LMP 11/08/2014   SpO2 96%   BMI 25.06 kg/m   Physical Exam Vitals signs and nursing note reviewed.  Constitutional:      General: She is not in acute distress.    Appearance: Normal appearance. She is well-developed. She is not ill-appearing.     Comments: Calm, cooperative. Hoarse voice (pt states chronic)  HENT:     Head: Normocephalic and atraumatic.  Eyes:     General: No scleral icterus.       Right eye: No discharge.        Left eye: No discharge.     Conjunctiva/sclera: Conjunctivae normal.     Pupils: Pupils are equal, round, and reactive to light.  Neck:     Musculoskeletal: Normal range of motion.  Cardiovascular:     Rate and Rhythm: Normal rate and regular rhythm.  Pulmonary:     Effort: Pulmonary effort is normal. No respiratory distress.     Breath sounds: Wheezing (diffuse wheezing) present.  Abdominal:     General: There is no distension.      Tenderness: There is abdominal tenderness (generalized, mild).  Musculoskeletal:     Right lower leg: Edema (trace) present.     Left lower leg: Edema (trace) present.     Comments: No lower extremity skin changes  Skin:    General: Skin is warm and dry.  Neurological:     Mental Status: She is alert and oriented to person, place, and time.  Psychiatric:        Behavior: Behavior normal.      ED Treatments / Results  Labs (all labs ordered are listed, but only abnormal results are displayed) Labs Reviewed  COMPREHENSIVE METABOLIC PANEL  LIPASE, BLOOD  CBC WITH DIFFERENTIAL/PLATELET  URINALYSIS, ROUTINE W REFLEX MICROSCOPIC  INFLUENZA PANEL BY PCR (TYPE A & B)    EKG EKG Interpretation  Date/Time:  Tuesday February 27 2019 14:43:46 EDT Ventricular Rate:  91 PR Interval:    QRS Duration: 87 QT Interval:  384 QTC Calculation: 473 R Axis:   -50 Text Interpretation:  Sinus rhythm LAD, consider left anterior fascicular block Abnormal R-wave progression, early transition No significant change since last tracing Confirmed by Blanchie Dessert [phone]) on 02/27/2019 4:10:18 PM   Radiology Dg Chest 2 View  Result Date: 02/27/2019 CLINICAL DATA:  Fever, cough EXAM: CHEST - 2 VIEW COMPARISON:  05/25/2018 FINDINGS: The heart size and mediastinal contours are within normal limits. Both lungs are clear. Disc degenerative disease of the thoracic spine. IMPRESSION: No acute abnormality of the lungs.  No focal airspace opacity. Electronically Signed   By: Eddie Candle M.D.   On: 02/27/2019 15:58    Procedures Procedures (including critical care time)  CRITICAL CARE Performed  by: Recardo Evangelist   Total critical care time: 35 minutes  Critical care time was exclusive of separately billable procedures and treating other patients.  Critical care was necessary to treat or prevent imminent or life-threatening deterioration.  Critical care was time spent personally by me on the  following activities: development of treatment plan with patient and/or surrogate as well as nursing, discussions with consultants, evaluation of patient's response to treatment, examination of patient, obtaining history from patient or surrogate, ordering and performing treatments and interventions, ordering and review of laboratory studies, ordering and review of radiographic studies, pulse oximetry and re-evaluation of patient's condition.   Medications Ordered in ED Medications  predniSONE (DELTASONE) tablet 60 mg (has no administration in time range)  ipratropium-albuterol (DUONEB) 0.5-2.5 (3) MG/3ML nebulizer solution 3 mL (has no administration in time range)  albuterol (PROVENTIL,VENTOLIN) solution continuous neb (10 mg/hr Nebulization Given 02/27/19 1436)  ondansetron (ZOFRAN) injection 4 mg (4 mg Intravenous Given 02/27/19 1532)  sodium chloride 0.9 % bolus 1,000 mL (1,000 mLs Intravenous New Bag/Given 02/27/19 1533)  ketorolac (TORADOL) 15 MG/ML injection 15 mg (15 mg Intravenous Given 02/27/19 1601)     Initial Impression / Assessment and Plan / ED Course  I have reviewed the triage vital signs and the nursing notes.  Pertinent labs & imaging results that were available during my care of the patient were reviewed by me and considered in my medical decision making (see chart for details).  58 year old who presents with low grade temp, cough, SOB, wheezing. Vitals are normal. Heart is regular rate and rhythm. Lung exam is remarkable for diffuse wheezing. Abdomen is soft and minimally tender. Will give CAT, steroids, fluids, Zofran. Will obtain labs, CXR.  CXR is negative. Labs are normal. She is still diffusely wheezing on exam. Will order Duoneb and ambulate. Will add flu test as well. She has recently traveled to [place], [place] and has had sick contacts but no one with COVID that she knows of. Care signed out to [facility] who will dispo.  Final Clinical Impressions(s) / ED Diagnoses    Final diagnoses:  Moderate persistent asthma with exacerbation    ED Discharge Orders    None       Recardo Evangelist, PA-C 02/27/19 1638

## 2019-02-27 NOTE — Telephone Encounter (Signed)
Patient was contacted to notify the patient regarding the transition to a phone session for today's [facility] appointment. Patient could not be reached so a voicemail was left to inform the patient.   Jackalyn Lombard, [facility] Intern

## 2019-02-27 NOTE — ED Provider Notes (Signed)
Received patient at signout from [facility].  Refer to provider note for full history and physical examination.  Briefly, patient is a 58 year old female with history of COPD/asthma, GERD, allergies.  History of the same.  Chest x-ray shows no acute cardiopulmonary abnormalities.  Reports she typically improves with an hour-long nebulizer.  Physical Exam  BP (!) 153/112 (BP Location: Left Arm)   Pulse 100   Temp 99.2 F (37.3 C) (Oral)   Resp (!) 23   Ht 5\' 2"  (1.575 m)   Wt 62.1 kg   LMP 11/08/2014   SpO2 100%   BMI 25.06 kg/m   Physical Exam Vitals signs and nursing note reviewed.  Constitutional:      General: She is not in acute distress.    Appearance: She is well-developed.  HENT:     Head: Normocephalic and atraumatic.  Eyes:     General:        Right eye: No discharge.        Left eye: No discharge.     Conjunctiva/sclera: Conjunctivae normal.  Neck:     Vascular: No JVD.     Trachea: No tracheal deviation.  Cardiovascular:     Rate and Rhythm: Normal rate and regular rhythm.  Pulmonary:     Effort: Pulmonary effort is normal.     Breath sounds: Wheezing present.     Comments: Diffuse expiratory wheezing.  Speaking full sentences without difficulty. Abdominal:     General: There is no distension.  Skin:    Findings: No erythema.  Neurological:     Mental Status: She is alert.  Psychiatric:        Behavior: Behavior normal.       MDM  Patient improved with albuterol nebulizer though she still has diffuse wheezing on auscultation of the lungs.  Patient states that she recently traveled to [place] and [place] 2 weeks ago.  She was in contact with her boyfriend who is a Administrator who had recently traveled to [place]. Given low grade fever, will test for flu and consult infection control for recommendations for possible COVID-19 testing.  5:40 PM Patient does not wish to stay any longer.  She does not want to know the results of her flu test  and does not wish to stay for potential COVID testing is recommended.  She states that she is feeling better and wishes to go home with an albuterol inhaler and a course of oral prednisone. She understands her workup is incomplete and could result in morbidity mortality as a result.  She understands this but would still like to go home.  I strongly encouraged the patient to self-quarantine for her benefit and the benefit of others.  She will call her PCP tomorrow morning to see if they recommend COVID-19 testing. Discussed strict ED return precautions.       Renita Papa, PA-C 02/28/19 0015    Dorie Rank, MD 02/28/19 [phone]

## 2019-02-27 NOTE — ED Notes (Signed)
Inhaler provided for home use

## 2019-02-27 NOTE — Discharge Instructions (Signed)
Use your albuterol inhaler as prescribed.  Take prednisone as prescribed beginning tomorrow.  Plenty water and get plenty of rest.  Take ibuprofen or Tylenol as needed for fevers or pain.  Please isolate yourself at home for the next 2 weeks. You could have had an exposure to Coronavirus.  Wash your hands frequently.  Avoid any contact with old people, young people, or people with medical problems.  Do not leave the house during this time.  Please return to the emergency department immediately if any concerning signs or symptoms develop such as worsening shortness of breath, high fevers, severe chest pains, persistent cough.

## 2019-02-27 NOTE — ED Notes (Signed)
Pt made aware of need for urine sample.  

## 2019-02-27 NOTE — ED Triage Notes (Signed)
Pt c/o asthma/wheezing and cough since last night. States that she has used her neb at home.

## 2019-02-27 NOTE — ED Notes (Signed)
Pt transported to XR.  

## 2019-02-28 ENCOUNTER — Telehealth: Payer: Self-pay | Admitting: *Deleted

## 2019-02-28 ENCOUNTER — Telehealth: Payer: Self-pay

## 2019-02-28 DIAGNOSIS — J988 Other specified respiratory disorders: Principal | ICD-10-CM

## 2019-02-28 DIAGNOSIS — B9789 Other viral agents as the cause of diseases classified elsewhere: Secondary | ICD-10-CM

## 2019-02-28 NOTE — Telephone Encounter (Signed)
Spoke w/ pt, she is [facility]

## 2019-02-28 NOTE — Telephone Encounter (Signed)
Patient returned call, she is willing to be tested for COVID-19.  Given flu negative, respiratory symptoms (fever, new cough, SOB), history of asthma and contact with a person who was travelling in high risk area, testing for COVID-19 is reasonable at this time.  Order placed for drive up testing.  Result will come to our clinic and we will call her with results.  At this time, she should self quarantine, information will be provided at the drive in testing site.    Gilles Chiquito, MD

## 2019-02-28 NOTE — Telephone Encounter (Signed)
In the 14 days prior to your symptom onset, did you travel to an area with a high prevalence of COVID?  In the 14 days prior to your symptom onset, did you have close contact with someone with COVID?  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 Had one Sunday of 101.  Do you have a cough? Yes, greenish  Do you have shortness of breath more than normal? Yes, some, my lungs hurt  Do you have chest pain?no  Are you able to eat and drink normally? No, but got zofran and am better  Have you seen a physician for these symptoms? [facility] 3/17  Does patient need flu / RVP testing? Was tested for flu at ED refused COVID  Does patient need COVID testing?    I have reviewed the patients PMHx and medications.  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

## 2019-02-28 NOTE — Telephone Encounter (Signed)
Reviewed case with Kathleen [name].   Patient is flu negative.  Given contact with high risk individual (travel in [place] and [place]), testing for COVID-19 would be reasonable.   2 options - -  Quarantine at home until she feels better - call the clinic in 2 days with a symptom update  Go to drive up testing station for COVID-19.  If positive, she will need to self quarantine for 2 weeks.   She does have risk factors including respiratory illness, but is otherwise well today, last fever Sunday by report.   Supportive care with cough suppression, hydration and rest at home.  Avoid large crowds or going out in public if not necessary.    Gilles Chiquito, MD

## 2019-02-28 NOTE — Telephone Encounter (Signed)
Pt calling back to speak with a nurse. 

## 2019-02-28 NOTE — Telephone Encounter (Signed)
She states on her way home today she would like to go to [facility] thru testing at [facility], can you place order

## 2019-03-01 ENCOUNTER — Telehealth: Payer: Self-pay | Admitting: Internal Medicine

## 2019-03-01 NOTE — Telephone Encounter (Signed)
Triage, after consultation with an attending, rec pt go to drive through testing for COVID. Order placed and CDC form filled out. I called pt and she states she went and waited 4 hrs but told they had no paper work and didn't complete test.  Pt had been to ED on the 17th and swabbed for flu. [facility] lab found out swabs held for 7 days and COVID can be added on up to 72 hrs after specimen obtained.   Pt indicated her sxs chronic and related to not having her asthma meds and her company was sending her meds.   She has already been told she needed COVID testing, is flu negative, possible exposure, and have fever, cough, and dyspnea I think COVID testing is reasonable. Will test ER swab for COVID.  I think she is overall low risk for COVID and does not need neg pressure room - just masked. Staff can use low risk PPE.

## 2019-03-02 ENCOUNTER — Other Ambulatory Visit: Payer: Self-pay

## 2019-03-02 ENCOUNTER — Ambulatory Visit (INDEPENDENT_AMBULATORY_CARE_PROVIDER_SITE_OTHER): Payer: Self-pay | Admitting: Internal Medicine

## 2019-03-02 VITALS — BP 113/68 | HR 100 | Temp 98.8°F | Ht 62.0 in | Wt 151.5 lb

## 2019-03-02 DIAGNOSIS — R6883 Chills (without fever): Secondary | ICD-10-CM

## 2019-03-02 DIAGNOSIS — R61 Generalized hyperhidrosis: Secondary | ICD-10-CM

## 2019-03-02 DIAGNOSIS — R062 Wheezing: Secondary | ICD-10-CM

## 2019-03-02 DIAGNOSIS — R0602 Shortness of breath: Secondary | ICD-10-CM

## 2019-03-02 DIAGNOSIS — R05 Cough: Secondary | ICD-10-CM

## 2019-03-02 DIAGNOSIS — Z7951 Long term (current) use of inhaled steroids: Secondary | ICD-10-CM

## 2019-03-02 DIAGNOSIS — Z79899 Other long term (current) drug therapy: Secondary | ICD-10-CM

## 2019-03-02 DIAGNOSIS — J45901 Unspecified asthma with (acute) exacerbation: Secondary | ICD-10-CM

## 2019-03-02 DIAGNOSIS — R110 Nausea: Secondary | ICD-10-CM

## 2019-03-02 DIAGNOSIS — J449 Chronic obstructive pulmonary disease, unspecified: Secondary | ICD-10-CM

## 2019-03-02 MED ORDER — IPRATROPIUM-ALBUTEROL 0.5-2.5 (3) MG/3ML IN SOLN: 3.0000 mL | Freq: Four times a day (QID) | RESPIRATORY_TRACT | 0 refills | Status: DC | PRN

## 2019-03-02 NOTE — Assessment & Plan Note (Addendum)
States she has had cough and SOB for the past three days that feels like an asthma exacerbation. She went to the ED for breathing treatment two days ago with previous symptoms in addition to nausea. She requested breathing treatment as she feels her albuterol nebulizer at home does not help as much. She had a temperature of 99.2. She states today that she did feel warm earlier in the day and felt sweaty and had chills. She was tested in the ED for COVID as she recently traveled to [name], [place], and [place]. Her son has also been ill with a cold recently. She ran out of her dulera and albuterol inhaler about a week ago and is waiting for these to arrive through [facility]. She states the breathing treatments helped but she continues to have wheezing.  When seen in the ED she was given 60 MG prednisone and had prednisone sent into [facility]. She has not picked this up because she was worried about price.   - She is high risk for COVID due to her recent travel - her symptoms seem more in line with acute asthma exacerbation. Discussed that while COVID test is pending she should pick up her prednisone to start treatment for asthma exacerbation. Will call with results of her test.  - provided information recommending self isolation while test results pending.   - provided sample of albuterol MDI and Breo - instructed to stop Breo once Dulera arrives in the mail  - prescribed ipratropium-albuterol nebulizer - told patient she does not need albuterol MDI and nebulizer. MDI is only for when she does not have access to her nebulizer. This will take some time to come in the mail, and we will have COVID results by then. She does live at home with her son.  - f/u if symptoms worsen or fail to improve

## 2019-03-02 NOTE — Patient Instructions (Addendum)
Thank you for allowing [place] to provide your care today. Today we discussed your COPD and cough.   Today we made the following changes to your medications:   Please START taking  Breo Ellipta - take one puff in the morning every day You may stop this medication once your [name] arrives in the mail   Albuterol (ventolin HFA) -take two puffs every four hours as needed for shortness of breath  Ipratropium-albuterol (DUONEB) - use every six hours as needed for shortness of breath. Do not use this in addition to your albuterol inhaler. These are the same medications and the albuterol inhaler is a rescue inhaler when you are not able to use your nebulizer.   Please pick up your prednisone prescription as directed by the emergency room and complete this medication. If you are positive for COVID, I will discontinue your prednisone.   Please follow-up if symptoms do not improve. We will call you with the results of your COVID test.    Should you have any questions or concerns please call the internal medicine clinic at [phone].    Coronavirus (COVID-19) Are you at risk?  Are you at risk for the Coronavirus (COVID-19)?  To be considered HIGH RISK for Coronavirus (COVID-19), you have to meet the following criteria:  . Traveled to [place], [place], [place], [place] or [place]; or in the [place] to [place], [place], [place], or [place]; and have fever, cough, and shortness of breath within the last 2 weeks of travel OR . Been in close contact with a person diagnosed with COVID-19 within the last 2 weeks and have fever, cough, and shortness of breath . IF YOU DO NOT MEET THESE CRITERIA, YOU ARE CONSIDERED LOW RISK FOR COVID-19.  What to do if you are HIGH RISK for COVID-19?  Marland Kitchen If you are having a medical emergency, call 911. . Seek medical care right away. Before you go to a doctor's office, urgent care or emergency department, call ahead and tell them about your recent travel,  contact with someone diagnosed with COVID-19, and your symptoms. You should receive instructions from your physician's office regarding next steps of care.  . When you arrive at healthcare provider, tell the healthcare staff immediately you have returned from visiting [place], [place], [place], [place] or [place]; or traveled in the [place] to [place], [place], [place], or [place]; in the last two weeks or you have been in close contact with a person diagnosed with COVID-19 in the last 2 weeks.   . Tell the health care staff about your symptoms: fever, cough and shortness of breath. . After you have been seen by a medical provider, you will be either: o Tested for (COVID-19) and discharged home on quarantine except to seek medical care if symptoms worsen, and asked to  - Stay home and avoid contact with others until you get your results (4-5 days)  - Avoid travel on public transportation if possible (such as bus, train, or airplane) or o Sent to the Emergency Department by EMS for evaluation, COVID-19 testing, and possible admission depending on your condition and test results.  What to do if you are LOW RISK for COVID-19?  Reduce your risk of any infection by using the same precautions used for avoiding the common cold or flu:  Marland Kitchen Wash your hands often with soap and warm water for at least 20 seconds.  If soap and water are not readily available, use an alcohol-based  hand sanitizer with at least 60% alcohol.  . If coughing or sneezing, cover your mouth and nose by coughing or sneezing into the elbow areas of your shirt or coat, into a tissue or into your sleeve (not your hands). . Avoid shaking hands with others and consider head nods or verbal greetings only. . Avoid touching your eyes, nose, or mouth with unwashed hands.  . Avoid close contact with people who are sick. . Avoid places or events with large numbers of people in one location, like concerts or sporting events. . Carefully  consider travel plans you have or are making. . If you are planning any travel outside or inside the [place], visit the CDC's Travelers' Health webpage for the latest health notices. . If you have some symptoms but not all symptoms, continue to monitor at home and seek medical attention if your symptoms worsen. . If you are having a medical emergency, call 911.   [place] / e-Visit: [email or web address]         [facility]: [facility]: [phone]                   [facility]: [phone]

## 2019-03-02 NOTE — Progress Notes (Signed)
   CC: SOB, cough  HPI:  Kathleen [name] is a 58 y.o. with PMH as below.   Please see A&P for assessment of the patient's acute and chronic medical conditions.   Past Medical History:  Diagnosis Date  . Asthma    2 sets of PFT's in 04/09 without sign variability. Last set with significant decrease in FEV1 with saline alone, suggesting Asthma but  recommended clinical corelation   . Asthma   . Bipolar 1 disorder [facility])    therapist is Caryl Asp and is followed by [facility]  . Blackout    negative work up including ESR, ANA, opthalmology referral, carotid dopplers, 2D echo, MRI and EEG.  . BREAST LUMP 03/25/2008   Annotation: bilaterally Qualifier: Diagnosis of  By: Tomasa Hosteller MD, Edmon Crape.   . 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 Bronchitis   . Chronic headache   . Chronic low back pain 08/08/2008   Qualifier: Diagnosis of  By: Redmond Pulling  MD, Mateo Flow    . Chronic pain    normal work up including TSH, RPR, B12, HIV, plain films, 2 ESR's, ANA, CK, RF along with routine CBC, CMET and UA. Further work up includes CRP, ESR, SPEP/UPEP, hepatitis erology, A1C , repeat ANA  . COPD (chronic obstructive pulmonary disease) ([place])   . DUB (dysfunctional uterine bleeding)    and pelvic pain, with negative endometrial bx in 07/09 and transvaginal U/S significant for mild fibroids in 0/09.  Marland Kitchen GERD (gastroesophageal reflux disease)   . Hallucinations 03/18/2008   Qualifier: Diagnosis of  By: Redmond Pulling  MD, Mateo Flow    . Lower extremity edema    Neg ABI's, normal 2D echo, normal albumin  . Menorrhagia   . Ovarian cyst   . Polysubstance abuse ([place])    none since March 17,2009.  Marland Kitchen Sleep apnea    NO CPAP  . Tubular adenoma of colon    Review of Systems:   Review of Systems  Constitutional: Positive for chills and diaphoresis. Negative for fever and malaise/fatigue.  HENT: Negative for congestion, ear pain, sinus pain and sore throat.    Respiratory: Positive for cough, sputum production, shortness of breath and wheezing. Negative for hemoptysis.   Cardiovascular: Negative for chest pain and leg swelling.  Gastrointestinal: Positive for nausea. Negative for constipation, diarrhea and vomiting.  Musculoskeletal: Negative for myalgias.  Neurological: Negative for dizziness and weakness.   Physical Exam:  Constitution: NAD, well-nourished appearing HENT: no pharyngeal erythema, /AT Eyes: no icterus or injection  Cardio: RRR, no m/r/g  Respiratory: bilateral wheezing, no crackles or rales   Neuro: normal affect, pleasant, a&o Skin: c/d/i    Vitals:   03/02/19 1521  BP: 113/68  Pulse: 100  Temp: 98.8 F (37.1 C)  TempSrc: Oral  SpO2: 96%  Weight: 151 lb 8 oz (68.7 kg)  Height: '5\' 2"'$  (1.575 m)     Assessment & Plan:   See Encounters Tab for problem based charting.  Patient discussed with Dr. Angelia Mould

## 2019-03-07 ENCOUNTER — Telehealth: Payer: Self-pay

## 2019-03-07 NOTE — Telephone Encounter (Signed)
Requesting to speak with a nurse about right hand swollen. Please call pt back.

## 2019-03-07 NOTE — Telephone Encounter (Signed)
Pt calls and states Her R hand is more swollen and unable to use, states she cannot bend the hand. Please advise.

## 2019-03-07 NOTE — Telephone Encounter (Signed)
Spoke with her about her hand, no numbness or tingling, diffusely swollen and red and painful. I told her that she should go to the emergency room to have it evaluated, otherwise she can come to clinic early in the morning if she would like to wait. She states she has to babysit tomorrow and likely cannot come until after 3pm, but will try to come in the morning.

## 2019-03-08 ENCOUNTER — Ambulatory Visit (INDEPENDENT_AMBULATORY_CARE_PROVIDER_SITE_OTHER): Payer: Self-pay | Admitting: Internal Medicine

## 2019-03-08 ENCOUNTER — Other Ambulatory Visit: Payer: Self-pay

## 2019-03-08 DIAGNOSIS — G43909 Migraine, unspecified, not intractable, without status migrainosus: Secondary | ICD-10-CM

## 2019-03-08 DIAGNOSIS — J45909 Unspecified asthma, uncomplicated: Secondary | ICD-10-CM

## 2019-03-08 DIAGNOSIS — R202 Paresthesia of skin: Principal | ICD-10-CM

## 2019-03-08 DIAGNOSIS — M25511 Pain in right shoulder: Secondary | ICD-10-CM

## 2019-03-08 DIAGNOSIS — F411 Generalized anxiety disorder: Secondary | ICD-10-CM

## 2019-03-08 DIAGNOSIS — R200 Anesthesia of skin: Secondary | ICD-10-CM

## 2019-03-08 LAB — NOVEL CORONAVIRUS, NAA (HOSP ORDER, SEND-OUT TO REF LAB; TAT 18-24 HRS): SARS-CoV-2, NAA: NOT DETECTED

## 2019-03-08 NOTE — Telephone Encounter (Signed)
rtc to pt, appt set for 1315, pt then adds "I want someone to listen to my lungs when I come" nurse ask lungs? She states I got a little wheezing too from last time, nothing new but I want them to listen, it will only be another 3 mins, ok"

## 2019-03-08 NOTE — Progress Notes (Addendum)
CC: Right hand numbness  HPI:  [name] is a 58 y.o. with migraine, generalized anxiety disorder, asthma who presents for evaluation of right hand numbness. Please see problem based charting for evaluation, assessment, and plan.  Past Medical History:  Diagnosis Date  . Asthma    2 sets of PFT's in 04/09 without sign variability. Last set with significant decrease in FEV1 with saline alone, suggesting Asthma but  recommended clinical corelation   . Asthma   . Bipolar 1 disorder [facility])    therapist is Caryl Asp and is followed by [facility]  . Blackout    negative work up including ESR, ANA, opthalmology referral, carotid dopplers, 2D echo, MRI and EEG.  . BREAST LUMP 03/25/2008   Annotation: bilaterally Qualifier: Diagnosis of  By: Tomasa Hosteller MD, Edmon Crape.   . 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 Bronchitis   . Chronic headache   . Chronic low back pain 08/08/2008   Qualifier: Diagnosis of  By: Redmond Pulling  MD, Mateo Flow    . Chronic pain    normal work up including TSH, RPR, B12, HIV, plain films, 2 ESR's, ANA, CK, RF along with routine CBC, CMET and UA. Further work up includes CRP, ESR, SPEP/UPEP, hepatitis erology, A1C , repeat ANA  . COPD (chronic obstructive pulmonary disease) ([place])   . DUB (dysfunctional uterine bleeding)    and pelvic pain, with negative endometrial bx in 07/09 and transvaginal U/S significant for mild fibroids in 0/09.  Marland Kitchen GERD (gastroesophageal reflux disease)   . Hallucinations 03/18/2008   Qualifier: Diagnosis of  By: Redmond Pulling  MD, Mateo Flow    . Lower extremity edema    Neg ABI's, normal 2D echo, normal albumin  . Menorrhagia   . Ovarian cyst   . Polysubstance abuse ([place])    none since March 17,2009.  Marland Kitchen Sleep apnea    NO CPAP  . Tubular adenoma of colon    Review of Systems:    Review of Systems  Constitutional: Negative for fever.  Respiratory: Negative for shortness of breath.    Gastrointestinal: Negative for nausea and vomiting.  Musculoskeletal: Positive for joint pain. Negative for falls and myalgias.  Neurological: Negative for dizziness.  Psychiatric/Behavioral: Negative for depression.   Physical Exam:  Vitals:   03/08/19 1317  BP: 112/79  Pulse: 97  Temp: 98.7 F (37.1 C)  TempSrc: Oral  SpO2: 100%  Weight: 145 lb 3.2 oz (65.9 kg)  Height: '5\' 2"'  (1.575 m)   Physical Exam  Constitutional: She appears well-developed and well-nourished. No distress.  HENT:  Head: Normocephalic and atraumatic.  Eyes: Conjunctivae are normal.  Cardiovascular: Normal rate, regular rhythm and normal heart sounds.  Respiratory: Effort normal and breath sounds normal. No respiratory distress. She has no wheezes.  GI: Soft. Bowel sounds are normal. She exhibits no distension. There is no abdominal tenderness.  Musculoskeletal:        General: Tenderness (right shoulder) present.     Comments: Decreased ability to abduct right arm. Strength exam limited by ability to partake in exam. Difficulty placing right arm behind back.  Neurological: She is alert.  Skin: She is not diaphoretic.  Psychiatric: She has a normal mood and affect. Her behavior is normal. Judgment and thought content normal.   Assessment & Plan:   See Encounters Tab for problem based charting.  Patient discussed with Dr. Beryle Beams  Medicine attending: Medical history, presenting problems,  physical findings, and medications, reviewed with resident physician Dr Lars Mage on the day of the patient visit and I concur with her evaluation and management plan.

## 2019-03-08 NOTE — Patient Instructions (Signed)
It was a pleasure to see you today Kathleen [name]. Please make the following changes:  I am sorry to hear about your right hand and shoulder pain. I believe your symptoms are due to an inflammation of your shoulder joint.  -Please rest your right arm. Use ice on your right shoulder and use nsaids for pain relief  If you have any questions or concerns, please call our clinic at [phone] between 9am-5pm and after hours call [phone] and ask for the internal medicine resident on call. If you feel you are having a medical emergency please call 911.   Thank you, we look forward to help you remain healthy!  Lars Mage, MD Internal Medicine PGY2   Rotator Cuff Tendinitis  Rotator cuff tendinitis is inflammation of the tough, cord-like bands that connect muscle to bone (tendons) in the rotator cuff. The rotator cuff includes all of the muscles and tendons that connect the arm to the shoulder. The rotator cuff holds the head of the upper arm bone (humerus) in the cup (fossa) of the shoulder blade (scapula). This condition can lead to a long-lasting (chronic) tear. The tear may be partial or complete. What are the causes? This condition is usually caused by overusing the rotator cuff. What increases the risk? This condition is more likely to develop in athletes and workers who frequently use their shoulder or reach over their heads. This can include activities such as:  Tennis.  Baseball or softball.  Swimming.  Construction work.  Painting. What are the signs or symptoms? Symptoms of this condition include:  Pain spreading (radiating) from the shoulder to the upper arm.  Swelling and tenderness in front of the shoulder.  Pain when reaching, pulling, or lifting the arm above the head.  Pain when lowering the arm from above the head.  Minor pain in the shoulder when resting.  Increased pain in the shoulder at night.  Difficulty placing the arm behind the back. How is this  diagnosed? This condition is diagnosed with a medical history and physical exam. Tests may also be done, including:  X-rays.  MRI.  Ultrasounds.  CT or MR arthrogram. During this test, a contrast material is injected and then images are taken. How is this treated? Treatment for this condition depends on the severity of the condition. In less severe cases, treatment may include:  Rest. This may be done with a sling that holds the shoulder still (immobilization). Your health care provider may also recommend avoiding activities that involve lifting your arm over your head.  Icing the shoulder.  Anti-inflammatory medicines, such as aspirin or ibuprofen. In more severe cases, treatment may include:  Physical therapy.  Steroid injections.  Surgery. Follow these instructions at home: If you have a sling:  Wear the sling as told by your health care provider. Remove it only as told by your health care provider.  Loosen the sling if your fingers tingle, become numb, or turn cold and blue.  Keep the sling clean.  If the sling is not waterproof, do not let it get wet. Remove it, if allowed, or cover it with a watertight covering when you take a bath or shower. Managing pain, stiffness, and swelling  If directed, put ice on the injured area. ? If you have a removable sling, remove it as told by your health care provider. ? Put ice in a plastic bag. ? Place a towel between your skin and the bag. ? Leave the ice on for 20 minutes, 2-3  times a day.  Move your fingers often to avoid stiffness and to lessen swelling.  Raise (elevate) the injured area above the level of your heart while you are lying down.  Find a comfortable sleeping position or sleep on a recliner, if available. Driving  Do not drive or use heavy machinery while taking prescription pain medicine.  Ask your health care provider when it is safe to drive if you have a sling on your arm. Activity  Rest your shoulder  as told by your health care provider.  Return to your normal activities as told by your health care provider. Ask your health care provider what activities are safe for you.  Do any exercises or stretches as told by your health care provider.  If you do repetitive overhead tasks, take small breaks in between and include stretching exercises as told by your health care provider. General instructions  Do not use any products that contain nicotine or tobacco, such as cigarettes and e-cigarettes. These can delay healing. If you need help quitting, ask your health care provider.  Take over-the-counter and prescription medicines only as told by your health care provider.  Keep all follow-up visits as told by your health care provider. This is important. Contact a health care provider if:  Your pain gets worse.  You have new pain in your arm, hands, or fingers.  Your pain is not relieved with medicine or does not get better after 6 weeks of treatment.  You have cracking sensations when moving your shoulder in certain directions.  You hear a snapping sound after using your shoulder, followed by severe pain and weakness. Get help right away if:  Your arm, hand, or fingers are numb or tingling.  Your arm, hand, or fingers are swollen or painful or they turn white or blue. Summary  Rotator cuff tendinitis is inflammation of the tough, cord-like bands that connect muscle to bone (tendons) in the rotator cuff.  This condition is usually caused by overusing the rotator cuff, which includes all of the muscles and tendons that connect the arm to the shoulder.  This condition is more likely to develop in athletes and workers who frequently use their shoulder or reach over their heads.  Treatment generally includes rest, anti-inflammatory medicines, and icing. In some cases, physical therapy and steroid injections may be needed. In severe cases, surgery may be needed. This information is not  intended to replace advice given to you by your health care provider. Make sure you discuss any questions you have with your health care provider. Document Released: 02/19/2004 Document Revised: 11/15/2016 Document Reviewed: 11/15/2016 Elsevier Interactive Patient Education  2019 [employer].

## 2019-03-09 DIAGNOSIS — R200 Anesthesia of skin: Secondary | ICD-10-CM | POA: Insufficient documentation

## 2019-03-09 DIAGNOSIS — R202 Paresthesia of skin: Principal | ICD-10-CM

## 2019-03-09 NOTE — Assessment & Plan Note (Signed)
Patient states that she has been having a 1 week history of numbness in her fourth and fifth digits that started acutely.  She notes pain in right shoulder with abduction. She denies any trauma. She has not taken any medication for the pain.  Assessment and plan Patient had limited ability to perform Phalen test and abduct right hand.  She had tenderness to palpation of the right shoulder.  Patient signs and symptoms seem consistent with acute rotator cuff tendinitis that is causing neuropathy on medial aspect of right hand.  Recommended supportive therapy for patient. Instructed to use ibuprofen, ice right shoulder.

## 2019-03-09 NOTE — Progress Notes (Signed)
Internal Medicine Clinic Attending  Case discussed with Dr. Seawell at the time of the visit.  We reviewed the resident's history and exam and pertinent patient test results.  I agree with the assessment, diagnosis, and plan of care documented in the resident's note.    

## 2019-03-31 ENCOUNTER — Emergency Department (HOSPITAL_COMMUNITY): Payer: Self-pay

## 2019-03-31 ENCOUNTER — Encounter (HOSPITAL_COMMUNITY): Payer: Self-pay | Admitting: Emergency Medicine

## 2019-03-31 ENCOUNTER — Other Ambulatory Visit: Payer: Self-pay

## 2019-03-31 ENCOUNTER — Inpatient Hospital Stay (HOSPITAL_COMMUNITY)
Admission: EM | Admit: 2019-03-31 | Discharge: 2019-04-01 | DRG: 190 | Disposition: A | Discharge status: Home / Self Care | Payer: Self-pay | Attending: Internal Medicine | Admitting: Internal Medicine

## 2019-03-31 DIAGNOSIS — G4730 Sleep apnea, unspecified: Secondary | ICD-10-CM | POA: Diagnosis present

## 2019-03-31 DIAGNOSIS — Z79891 Long term (current) use of opiate analgesic: Secondary | ICD-10-CM

## 2019-03-31 DIAGNOSIS — J45901 Unspecified asthma with (acute) exacerbation: Secondary | ICD-10-CM

## 2019-03-31 DIAGNOSIS — Z87891 Personal history of nicotine dependence: Secondary | ICD-10-CM

## 2019-03-31 DIAGNOSIS — Z8249 Family history of ischemic heart disease and other diseases of the circulatory system: Secondary | ICD-10-CM

## 2019-03-31 DIAGNOSIS — J441 Chronic obstructive pulmonary disease with (acute) exacerbation: Secondary | ICD-10-CM

## 2019-03-31 DIAGNOSIS — R600 Localized edema: Secondary | ICD-10-CM | POA: Diagnosis present

## 2019-03-31 DIAGNOSIS — F419 Anxiety disorder, unspecified: Secondary | ICD-10-CM | POA: Diagnosis present

## 2019-03-31 DIAGNOSIS — Z7951 Long term (current) use of inhaled steroids: Secondary | ICD-10-CM

## 2019-03-31 DIAGNOSIS — Z79899 Other long term (current) drug therapy: Secondary | ICD-10-CM

## 2019-03-31 DIAGNOSIS — J9601 Acute respiratory failure with hypoxia: Secondary | ICD-10-CM | POA: Diagnosis present

## 2019-03-31 DIAGNOSIS — M62838 Other muscle spasm: Secondary | ICD-10-CM | POA: Diagnosis present

## 2019-03-31 DIAGNOSIS — Z888 Allergy status to other drugs, medicaments and biological substances status: Secondary | ICD-10-CM

## 2019-03-31 DIAGNOSIS — K219 Gastro-esophageal reflux disease without esophagitis: Secondary | ICD-10-CM | POA: Diagnosis present

## 2019-03-31 DIAGNOSIS — F319 Bipolar disorder, unspecified: Secondary | ICD-10-CM | POA: Diagnosis present

## 2019-03-31 DIAGNOSIS — G8929 Other chronic pain: Secondary | ICD-10-CM | POA: Diagnosis present

## 2019-03-31 DIAGNOSIS — Z7982 Long term (current) use of aspirin: Secondary | ICD-10-CM

## 2019-03-31 HISTORY — DX: Chronic obstructive pulmonary disease with (acute) exacerbation: J44.1

## 2019-03-31 LAB — D-DIMER, QUANTITATIVE: D-Dimer, Quant: 0.34 ug/mL-FEU (ref 0.00–0.50)

## 2019-03-31 LAB — I-STAT BETA HCG BLOOD, ED (MC, WL, AP ONLY): I-stat hCG, quantitative: <5 m[IU]/mL (ref <5)

## 2019-03-31 LAB — CBC
HCT: 44.3 % (ref 36.0–46.0)
Hemoglobin: 14.1 g/dL (ref 12.0–15.0)
MCH: 28.5 pg (ref 26.0–34.0)
MCHC: 31.8 g/dL (ref 30.0–36.0)
MCV: 89.7 fL (ref 80.0–100.0)
Platelets: 288 10*3/uL (ref 150–400)
RBC: 4.94 MIL/uL (ref 3.87–5.11)
RDW: 13.1 % (ref 11.5–15.5)
WBC: 5.9 10*3/uL (ref 4.0–10.5)
nRBC: 0 % (ref 0.0–0.2)

## 2019-03-31 LAB — BASIC METABOLIC PANEL
Anion gap: 8 (ref 5–15)
BUN: 12 mg/dL (ref 6–20)
CO2: 26 mmol/L (ref 22–32)
Calcium: 9.3 mg/dL (ref 8.9–10.3)
Chloride: 106 mmol/L (ref 98–111)
Creatinine, Ser: 0.93 mg/dL (ref 0.44–1.00)
GFR calc Af Amer: >60 mL/min (ref >60)
GFR calc non Af Amer: >60 mL/min (ref >60)
Glucose, Bld: 89 mg/dL (ref 70–99)
Potassium: 3.5 mmol/L (ref 3.5–5.1)
Sodium: 140 mmol/L (ref 135–145)

## 2019-03-31 MED ORDER — MONTELUKAST SODIUM 10 MG PO TABS
10.0000 mg | ORAL_TABLET | Freq: Every day | ORAL | Status: DC
  Administered 2019-03-31: 10 mg via ORAL
  Filled 2019-03-31: qty 1

## 2019-03-31 MED ORDER — PANTOPRAZOLE SODIUM 40 MG PO TBEC
40.0000 mg | DELAYED_RELEASE_TABLET | Freq: Every day | ORAL | Status: DC
  Administered 2019-03-31: 40 mg via ORAL
  Filled 2019-03-31: qty 1

## 2019-03-31 MED ORDER — ORAL CARE MOUTH RINSE
15.0000 mL | Freq: Two times a day (BID) | OROMUCOSAL | Status: DC
  Administered 2019-03-31 – 2019-04-01 (×2): 15 mL via OROMUCOSAL

## 2019-03-31 MED ORDER — ALBUTEROL SULFATE HFA 108 (90 BASE) MCG/ACT IN AERS
10.0000 | INHALATION_SPRAY | Freq: Once | RESPIRATORY_TRACT | Status: AC
  Administered 2019-03-31: 10 via RESPIRATORY_TRACT
  Administered 2019-03-31: 6 via RESPIRATORY_TRACT
  Filled 2019-03-31: qty 6.7

## 2019-03-31 MED ORDER — AZITHROMYCIN 250 MG PO TABS: 250.0000 mg | ORAL_TABLET | Freq: Every day | ORAL | Status: DC

## 2019-03-31 MED ORDER — DOXYCYCLINE HYCLATE 100 MG PO TABS
100.0000 mg | ORAL_TABLET | Freq: Once | ORAL | Status: AC
  Administered 2019-03-31: 100 mg via ORAL
  Filled 2019-03-31: qty 1

## 2019-03-31 MED ORDER — ACETAMINOPHEN 325 MG PO TABS
650.0000 mg | ORAL_TABLET | Freq: Four times a day (QID) | ORAL | Status: DC | PRN
  Administered 2019-04-01 (×2): 650 mg via ORAL
  Filled 2019-03-31 (×2): qty 2

## 2019-03-31 MED ORDER — AZITHROMYCIN 250 MG PO TABS
500.0000 mg | ORAL_TABLET | Freq: Every day | ORAL | Status: AC
  Administered 2019-03-31: 500 mg via ORAL
  Filled 2019-03-31: qty 2

## 2019-03-31 MED ORDER — HYDROXYZINE HCL 10 MG PO TABS
10.0000 mg | ORAL_TABLET | Freq: Three times a day (TID) | ORAL | Status: DC | PRN
  Administered 2019-04-01: 10 mg via ORAL
  Filled 2019-03-31 (×3): qty 1

## 2019-03-31 MED ORDER — SERTRALINE HCL 100 MG PO TABS: 100.0000 mg | ORAL_TABLET | Freq: Two times a day (BID) | ORAL | Status: DC

## 2019-03-31 MED ORDER — AEROCHAMBER PLUS FLO-VU MEDIUM MISC
1.0000 | Freq: Once | Status: AC
  Administered 2019-03-31: 1
  Filled 2019-03-31: qty 1

## 2019-03-31 MED ORDER — METHYLPREDNISOLONE SODIUM SUCC 125 MG IJ SOLR
125.0000 mg | Freq: Once | INTRAMUSCULAR | Status: AC
  Administered 2019-03-31: 125 mg via INTRAVENOUS
  Filled 2019-03-31: qty 2

## 2019-03-31 MED ORDER — MOMETASONE FURO-FORMOTEROL FUM 200-5 MCG/ACT IN AERO
1.0000 | INHALATION_SPRAY | Freq: Two times a day (BID) | RESPIRATORY_TRACT | Status: DC
  Administered 2019-03-31 – 2019-04-01 (×2): 1 via RESPIRATORY_TRACT
  Filled 2019-03-31: qty 8.8

## 2019-03-31 MED ORDER — IPRATROPIUM BROMIDE HFA 17 MCG/ACT IN AERS
2.0000 | INHALATION_SPRAY | RESPIRATORY_TRACT | Status: DC
  Administered 2019-03-31 (×2): 2 via RESPIRATORY_TRACT
  Administered 2019-03-31: 3 via RESPIRATORY_TRACT
  Administered 2019-03-31: 2 via RESPIRATORY_TRACT
  Filled 2019-03-31: qty 12.9

## 2019-03-31 MED ORDER — UMECLIDINIUM BROMIDE 62.5 MCG/INH IN AEPB
1.0000 | INHALATION_SPRAY | Freq: Every day | RESPIRATORY_TRACT | Status: DC
  Filled 2019-03-31: qty 7

## 2019-03-31 MED ORDER — PREDNISONE 20 MG PO TABS
40.0000 mg | ORAL_TABLET | Freq: Every day | ORAL | Status: DC
  Administered 2019-04-01: 40 mg via ORAL
  Filled 2019-03-31: qty 2

## 2019-03-31 MED ORDER — SODIUM CHLORIDE 0.9 % IV BOLUS
500.0000 mL | Freq: Once | INTRAVENOUS | Status: AC
  Administered 2019-03-31: 500 mL via INTRAVENOUS

## 2019-03-31 MED ORDER — ENOXAPARIN SODIUM 40 MG/0.4ML ~~LOC~~ SOLN
40.0000 mg | SUBCUTANEOUS | Status: DC
  Administered 2019-03-31: 40 mg via SUBCUTANEOUS
  Filled 2019-03-31: qty 0.4

## 2019-03-31 MED ORDER — ASPIRIN EC 81 MG PO TBEC
81.0000 mg | DELAYED_RELEASE_TABLET | Freq: Every day | ORAL | Status: DC
  Administered 2019-03-31: 81 mg via ORAL
  Filled 2019-03-31: qty 1

## 2019-03-31 MED ORDER — ALBUTEROL SULFATE (2.5 MG/3ML) 0.083% IN NEBU
5.0000 mg | INHALATION_SOLUTION | Freq: Once | RESPIRATORY_TRACT | Status: DC
  Filled 2019-03-31: qty 6

## 2019-03-31 MED ORDER — TIOTROPIUM BROMIDE MONOHYDRATE 18 MCG IN CAPS: 18.0000 ug | ORAL_CAPSULE | Freq: Every day | RESPIRATORY_TRACT | Status: DC

## 2019-03-31 MED ORDER — ONDANSETRON HCL 4 MG PO TABS
4.0000 mg | ORAL_TABLET | Freq: Three times a day (TID) | ORAL | Status: DC | PRN
  Administered 2019-04-01: 4 mg via ORAL
  Filled 2019-03-31: qty 1

## 2019-03-31 MED ORDER — ACETAMINOPHEN 650 MG RE SUPP: 650.0000 mg | Freq: Four times a day (QID) | RECTAL | Status: DC | PRN

## 2019-03-31 MED ORDER — IPRATROPIUM-ALBUTEROL 20-100 MCG/ACT IN AERS
2.0000 | INHALATION_SPRAY | Freq: Four times a day (QID) | RESPIRATORY_TRACT | Status: DC
  Administered 2019-03-31 – 2019-04-01 (×3): 2 via RESPIRATORY_TRACT
  Filled 2019-03-31: qty 4

## 2019-03-31 MED ORDER — MAGNESIUM SULFATE 2 GM/50ML IV SOLN
2.0000 g | Freq: Once | INTRAVENOUS | Status: AC
  Administered 2019-03-31: 2 g via INTRAVENOUS
  Filled 2019-03-31: qty 50

## 2019-03-31 NOTE — ED Provider Notes (Addendum)
[facility] DEPT Provider Note   CSN: [id number] Arrival date & time: 03/31/19  0848    History   Chief Complaint Chief Complaint  Patient presents with   Shortness of Breath    HPI [name] is a 58 y.o. female.     HPI   Patient is a 58 year old female with history of asthma, bipolar 1 disorder, chronic pain, sexual uterine bleeding, GERD, hallucinations, COPD, polysubstance abuse, who presents the emergency department today for evaluation of shortness of breath that began yesterday.  Patient states that she drove to [place] 5 days ago in a truck.  Yesterday on her way home to [place] she started feeling short of breath.  States she has been using her inhalers and nebulizer treatments at home without relief.  This morning she ran out of her nebulizer treatments.  She reports dry cough.  She reports chest wall pain with cough.  She has minimal pain with inspiration.  She reports muscle spasms to her abdomen.  These occur intermittently and resolve after several seconds to minutes.  Had one episode of vomiting yesterday.  No further vomiting.  No diarrhea or urinary complaints.  No fevers.  Of note, patient seen on 02/27/2019 in the ED for shortness of breath and suspected COPD exacerbation.  At that time had negative flu testing.  She also had a coronavirus testing at that time that was negative.  Past Medical History:  Diagnosis Date   Asthma    2 sets of PFT's in 04/09 without sign variability. Last set with significant decrease in FEV1 with saline alone, suggesting Asthma but  recommended clinical corelation    Asthma    Bipolar 1 disorder [facility])    therapist is Joy and is followed by [facility]   Blackout    negative work up including ESR, ANA, opthalmology referral, carotid dopplers, 2D echo, MRI and EEG.   BREAST LUMP 03/25/2008   Annotation: bilaterally Qualifier: Diagnosis of  By: Tomasa Hosteller MD, Edmon Crape.     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 Further work up includes CRP, ESR, SPEP/UPEP, hepatitis erology, A1C , repeat ANA   COPD (chronic obstructive pulmonary disease) ([place])    DUB (dysfunctional uterine bleeding)    and pelvic pain, with negative endometrial bx in 07/09 and transvaginal U/S significant for mild fibroids in 0/09.   GERD (gastroesophageal reflux disease)    Hallucinations 03/18/2008   Qualifier: Diagnosis of  By: Redmond Pulling  MD, Mateo Flow     Lower extremity edema    Neg ABI's, normal 2D echo, normal albumin   Menorrhagia    Ovarian cyst    Polysubstance abuse ([place])    none since March 17,2009.   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   BIPOLAR DISORDER UNSPECIFIED 01/21/2010   GERD 01/21/2010   ARTHRITIS, GENERALIZED 10/08/2008   GENERALIZED ANXIETY DISORDER 03/14/2008   DRUG ABUSE 03/14/2008   Migraine variant 03/14/2008    Past Surgical History:  Procedure Laterality Date   CESAREAN SECTION     CESAREAN SECTION     HERNIA REPAIR     Left partial oophorectomy     OOPHORECTOMY     1/2 ovary removed   VAGINA SURGERY     mesh     OB History    Gravida  3   Para  2   Term  2   Preterm  0   AB  1   Living  2     SAB  0   TAB  1   Ectopic  0   Multiple      Live Births  2              Home Medications    Prior to Admission medications   Medication Sig Start Date End Date Taking? Authorizing Provider  albuterol (PROVENTIL HFA;VENTOLIN HFA) 108 (90 Base) MCG/ACT inhaler Inhale 2 puffs into the lungs every 4 (four) hours as needed for wheezing or shortness of breath. 01/15/19  Yes [name], MD  aspirin EC 81 MG tablet Take 1 tablet (81 mg total) by mouth daily. 01/15/19  Yes Helberg, Larkin Ina, MD  ipratropium-albuterol (DUONEB) 0.5-2.5 (3) MG/3ML SOLN Take 3 mLs by nebulization every 6 (six) hours as needed. 03/02/19  Yes Seawell, Jaimie A, DO  lidocaine (LIDODERM) 5 % Place 1 patch onto the skin daily. Remove & Discard patch within 12 hours or as directed by MD   Yes [provider]  loratadine (CLARITIN) 10 MG tablet Take 1 tablet (10 mg total) by mouth daily. 01/15/19 04/15/19 Yes Helberg, Larkin Ina, MD  mometasone-formoterol (DULERA) 200-5 MCG/ACT AERO Inhale 1 puff into the lungs 2 (two) times daily. 01/15/19  Yes Helberg, Larkin Ina, MD  montelukast (SINGULAIR) 10 MG tablet Take 1 tablet (10 mg total) by mouth at bedtime. 12/28/18  Yes Kathi Ludwig, MD  omeprazole (PRILOSEC) 20 MG capsule Take 1 capsule (20 mg total) by mouth daily. 12/28/18 12/28/19 Yes Kathi Ludwig, MD  ondansetron (ZOFRAN) 4 MG tablet Take 1 tablet (4 mg total) by mouth every 8 (eight) hours as needed for nausea or vomiting. 02/27/19  Yes Fawze, Mina A, PA-C  oxyCODONE-acetaminophen (PERCOCET/ROXICET) 5-325 MG tablet Take 1 tablet by mouth every 6 (six) hours as needed for severe pain.   Yes [provider]  sertraline (ZOLOFT) 100 MG tablet Take 100 mg by mouth 2 (two) times a day.   Yes [provider]  tiotropium (SPIRIVA) 18 MCG inhalation capsule Place 18 mcg into inhaler and inhale daily.   Yes [provider]  ondansetron (ZOFRAN ODT) 8 MG disintegrating tablet Take 1 tablet (8 mg total) by mouth every 8 (eight) hours as needed for nausea or  vomiting. Patient not taking: Reported on 02/27/2019 01/07/19   [name], MD    Family History Family History  Problem Relation Age of Onset   Colon cancer Mother 17   Breast cancer Mother 41   Diabetes Father    Hypertension Father    Kidney disease Father    Colon cancer Father 25   Prostate cancer Father    Cancer Father    Kidney disease Sister    Cancer Brother    Breast  cancer Maternal Grandmother 6    Social History Social History   Tobacco Use   Smoking status: Former Smoker    Packs/day: 0.25    Years: 30.00    Pack years: 7.50    Types: Cigarettes    Last attempt to quit: 12/28/2016    Years since quitting: 2.2   Smokeless tobacco: Never Used  Substance Use Topics   Alcohol use: Yes    Alcohol/week: 0.0 standard drinks    Comment: socailly   Drug use: No    Types: Marijuana    Comment: former     Allergies   Topiramate and Tramadol   Review of Systems Review of Systems  Constitutional: Negative for fever.  HENT: Negative for ear pain and sore throat.   Eyes: Negative for visual disturbance.  Respiratory: Positive for cough, shortness of breath and wheezing.   Cardiovascular: Positive for chest pain and leg swelling (left ankle).  Gastrointestinal: Positive for abdominal pain. Negative for constipation, diarrhea, nausea and vomiting (resolved).  Genitourinary: Negative for dysuria.  Musculoskeletal: Negative for back pain.  Skin: Negative for rash.  Neurological: Negative for headaches.  All other systems reviewed and are negative.    Physical Exam Updated Vital Signs BP 114/82    Pulse (!) 106    Temp 98.6 F (37 C) (Oral)    Resp (!) 29    LMP 11/08/2014    SpO2 96%   Physical Exam Vitals signs and nursing note reviewed.  Constitutional:      General: She is not in acute distress.    Appearance: She is well-developed.  HENT:     Head: Normocephalic and atraumatic.  Eyes:     Conjunctiva/sclera: Conjunctivae  normal.  Neck:     Musculoskeletal: Neck supple.  Cardiovascular:     Rate and Rhythm: Normal rate and regular rhythm.     Heart sounds: No murmur.  Pulmonary:     Effort: Pulmonary effort is normal. No respiratory distress.     Breath sounds: Examination of the right-upper field reveals wheezing. Examination of the left-upper field reveals wheezing. Examination of the right-middle field reveals wheezing. Examination of the left-middle field reveals wheezing. Examination of the right-lower field reveals wheezing. Examination of the left-lower field reveals wheezing. Wheezing present. No decreased breath sounds, rhonchi or rales.     Comments: Dry cough on exam. Tachypneic. Speaking in short sentences.  Abdominal:     Palpations: Abdomen is soft.     Tenderness: There is no abdominal tenderness.  Skin:    General: Skin is warm and dry.  Neurological:     Mental Status: She is alert.      ED Treatments / Results  Labs (all labs ordered are listed, but only abnormal results are displayed) Labs Reviewed  BASIC METABOLIC PANEL  CBC  D-DIMER, QUANTITATIVE (NOT AT [facility])  I-STAT BETA HCG BLOOD, ED ([facility], [facility], [facility] ONLY)    EKG EKG Interpretation  Date/Time:  Saturday March 31 2019 09:02:04 EDT Ventricular Rate:  96 PR Interval:    QRS Duration: 101 QT Interval:  387 QTC Calculation: 490 R Axis:   -32 Text Interpretation:  Sinus rhythm Ventricular premature complex Left axis deviation Abnormal R-wave progression, early transition Borderline prolonged QT interval Baseline wander and artifact Within limitations, no acute ischemic changes Confirmed by Duffy Bruce [phone]) on 03/31/2019 9:25:48 AM   Radiology Dg Chest Portable 1 View  Result Date: 03/31/2019 CLINICAL DATA:  Short of breath, history of COPD EXAM: PORTABLE  CHEST 1 VIEW COMPARISON:  None. FINDINGS: Normal mediastinum and cardiac silhouette. Normal pulmonary vasculature. No evidence of effusion, infiltrate, or  pneumothorax. No acute bony abnormality. IMPRESSION: Normal chest radiograph. Electronically Signed   By: Suzy Bouchard M.D.   On: 03/31/2019 10:18    Procedures Procedures (including critical care time)  Medications Ordered in ED Medications  ipratropium (ATROVENT HFA) inhaler 2 puff (2 puffs Inhalation Given 03/31/19 1522)  albuterol (VENTOLIN HFA) 108 (90 Base) MCG/ACT inhaler 10 puff (6 puffs Inhalation Given 03/31/19 1052)  AeroChamber Plus Flo-Vu Medium MISC 1 each (1 each Other Given 03/31/19 0942)  methylPREDNISolone sodium succinate (SOLU-MEDROL) 125 mg/2 mL injection 125 mg (125 mg Intravenous Given 03/31/19 0942)  sodium chloride 0.9 % bolus 500 mL (0 mLs Intravenous Stopped 03/31/19 1053)  magnesium sulfate IVPB 2 g 50 mL (0 g Intravenous Stopped 03/31/19 1207)  doxycycline (VIBRA-TABS) tablet 100 mg (100 mg Oral Given 03/31/19 1110)     Initial Impression / Assessment and Plan / ED Course  I have reviewed the triage vital signs and the nursing notes.  Pertinent labs & imaging results that were available during my care of the patient were reviewed by me and considered in my medical decision making (see chart for details).     Final Clinical Impressions(s) / ED Diagnoses   Final diagnoses:  Acute respiratory failure with hypoxia ([place])  COPD exacerbation ([place])   Patient with history of COPD/asthma presented to ED complaining of shortness of breath and cough that started yesterday.  Has tried home nebulizer treatments without relief.  Initially presented to the ED with sats 89% on room air.  She does not normally wear oxygen at home.  Sats increased to 96% on 2 L.  She is also tachypneic and tachycardic.  Afebrile.  Recent reports of extended travel driving to [place] and back this week.  Lungs with diffuse wheezing throughout and dry cough on exam.  No peripheral edema or calf tenderness.    Patient will be given albuterol and Atrovent.  She will be given Solu-Medrol as well  as magnesium.  Will obtain chest x-ray, EKG and laboratory work.  Will add d-dimer given recent reports of extended periods of travel and tachycardia and hypoxia on arrival.  CBC without leukocytosis or anemia BMP  WNL Beta hcg negative ddimer is negative, making PE much less likely  CXR without acute abnormality.   EKG with NSR, PVC's, left axis deviation, abnormal R wave progression, early transition, and borderline prolonged QT interval.   On re-eval after medications she feels somewhat improved but still complains of some wheezing and appears tachypneic, RR 27 on monitor.  Trialed without supplemental oxygen and patient sats dropped to the 90s.  She was also noted to become hypoxic with ambulation to the restroom.  Will plan for admission for COPD/asthma exacerbation.  Consult with Dr. Berline Lopes with internal medicine teaching service who accepts pt for admission and for transfer to [facility]. Attending physician is Dr. Larey Dresser.  4:05 PM [facility] at bedside. Evaluated pt. She states she feels improved. She is in no distress. She is stable for transport.   MIYAH HAMPSHIRE was evaluated in Emergency Department on 03/31/2019 for the symptoms described in the history of present illness. She was evaluated in the context of the global COVID-19 pandemic, which necessitated consideration that the patient might be at risk for infection with the SARS-CoV-2 virus that causes COVID-19. Institutional protocols and algorithms that pertain to the evaluation of patients  at risk for COVID-19 are in a state of rapid change based on information released by regulatory bodies including the CDC and federal and state organizations. These policies and algorithms were followed during the patient's care in the ED.   ED Discharge Orders    None       Rodney Booze, PA-C 03/31/19 [address] Rakhi Romagnoli S, PA-C 03/31/19 1608    Duffy Bruce, MD 04/01/19 [phone]

## 2019-03-31 NOTE — ED Notes (Signed)
[facility] is 15 minutes out.

## 2019-03-31 NOTE — ED Triage Notes (Signed)
Patient here from home with complaints of SOB that started last night. Hx of COPD. Report that she has run out of medication.

## 2019-03-31 NOTE — ED Notes (Signed)
Report called to [facility] for Transport to [facility].

## 2019-03-31 NOTE — Progress Notes (Signed)
Pt received from [facility] ED via [place]. Report obtained from [place]. Pt on 2L. Pt oriented to room and equipment. Call bell within reach. Pt assisted to order dinner. IM notified that pt had arrived.   Loel Dubonnet, RN

## 2019-03-31 NOTE — ED Notes (Signed)
Patient ambulated to restroom, provided urine specimen and ambulated back and O2 dropped down to 87-89% on room air with moderate dyspnea.  RN instructed patient to take another 2 puffs from inhaler.
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
## 2019-03-31 NOTE — ED Notes (Signed)
1041 to 1053, ED PA removed O2 to witness patient O2 Saturation, respirations, and to observe for any dyspnea while at rest / talking.   O2 has been validated in system from 1041 to 1053.

## 2019-03-31 NOTE — ED Notes (Signed)
Report given to [facility] for [facility], Room 17.

## 2019-03-31 NOTE — ED Notes (Signed)
RN has spoken with Internal Medicine Provider regarding patient status for transport to [facility].

## 2019-03-31 NOTE — Care Plan (Signed)
Paged for admission to [facility] from [facility] to the IMTS service for likely COPD exacerbation with increased oxygen requirement as this is an IMTS patient. Patient noted to be stable at this time as per handoff report from ER clinician Cortni Courture. Per report and chart review the patient has been treated with corticosteroids, inhalers, magnesium sulfate and doxycycline with insufficient improvement to permit discharge home. Will likely require continued corticosteroids and prolonged inpatient monitoring prior to discharge. IMTS is glad to accept the patient granted transfer can occur within a reasonable time to permit the admitting team access to evaluate the patient at bedside. A 2 hour limit for arrival of the patient at [facility] has been agreed upon by the appropriate department directors as a rule of thumb to permit a safe handoff. This is with the understanding that the ER clinician continue to monitor the patient until transport arrives at [facility]. That said, should the transfer process not occur within a safe and reasonable time, [facility] are required to admit the patient at [facility] under their care as the IMTS does not have a team at [facility].   Please contact the resident pager below for updates on transportation availability and or  delays to discuss further care.   Kathi Ludwig, MD [facility] Internal Medicine, PGY-2 Pager # [phone]

## 2019-04-01 DIAGNOSIS — F411 Generalized anxiety disorder: Secondary | ICD-10-CM

## 2019-04-01 LAB — BRAIN NATRIURETIC PEPTIDE: B Natriuretic Peptide: 55.9 pg/mL (ref 0.0–100.0)

## 2019-04-01 LAB — BASIC METABOLIC PANEL
Anion gap: 9 (ref 5–15)
BUN: 12 mg/dL (ref 6–20)
CO2: 24 mmol/L (ref 22–32)
Calcium: 9 mg/dL (ref 8.9–10.3)
Chloride: 106 mmol/L (ref 98–111)
Creatinine, Ser: 0.77 mg/dL (ref 0.44–1.00)
GFR calc Af Amer: >60 mL/min (ref >60)
GFR calc non Af Amer: >60 mL/min (ref >60)
Glucose, Bld: 81 mg/dL (ref 70–99)
Potassium: 3.7 mmol/L (ref 3.5–5.1)
Sodium: 139 mmol/L (ref 135–145)

## 2019-04-01 LAB — HIV ANTIBODY (ROUTINE TESTING W REFLEX): HIV Screen 4th Generation wRfx: NONREACTIVE

## 2019-04-01 LAB — CBC
HCT: 38.9 % (ref 36.0–46.0)
Hemoglobin: 12.3 g/dL (ref 12.0–15.0)
MCH: 27.5 pg (ref 26.0–34.0)
MCHC: 31.6 g/dL (ref 30.0–36.0)
MCV: 86.8 fL (ref 80.0–100.0)
Platelets: 293 10*3/uL (ref 150–400)
RBC: 4.48 MIL/uL (ref 3.87–5.11)
RDW: 13 % (ref 11.5–15.5)
WBC: 11.3 10*3/uL — ABNORMAL HIGH (ref 4.0–10.5)
nRBC: 0 % (ref 0.0–0.2)

## 2019-04-01 MED ORDER — PREDNISONE 20 MG PO TABS: 40.0000 mg | ORAL_TABLET | Freq: Every day | ORAL | 0 refills | Status: DC

## 2019-04-01 MED ORDER — TIOTROPIUM BROMIDE MONOHYDRATE 18 MCG IN CAPS: 18.0000 ug | ORAL_CAPSULE | Freq: Every day | RESPIRATORY_TRACT | 1 refills | Status: DC

## 2019-04-01 MED ORDER — AZITHROMYCIN 250 MG PO TABS: ORAL_TABLET | ORAL | 0 refills | Status: DC

## 2019-04-01 MED ORDER — IPRATROPIUM-ALBUTEROL 0.5-2.5 (3) MG/3ML IN SOLN: 3.0000 mL | Freq: Four times a day (QID) | RESPIRATORY_TRACT | 0 refills | Status: DC | PRN

## 2019-04-01 MED ORDER — MOMETASONE FURO-FORMOTEROL FUM 200-5 MCG/ACT IN AERO: 1.0000 | INHALATION_SPRAY | Freq: Two times a day (BID) | RESPIRATORY_TRACT | 0 refills | Status: DC

## 2019-04-01 MED ORDER — GUAIFENESIN ER 600 MG PO TB12
600.0000 mg | ORAL_TABLET | Freq: Two times a day (BID) | ORAL | Status: DC | PRN
  Administered 2019-04-01: 600 mg via ORAL
  Filled 2019-04-01: qty 1

## 2019-04-01 MED ORDER — ALBUTEROL SULFATE HFA 108 (90 BASE) MCG/ACT IN AERS: 2.0000 | INHALATION_SPRAY | RESPIRATORY_TRACT | 0 refills | Status: DC | PRN

## 2019-04-01 NOTE — Progress Notes (Signed)
Date: 04/01/2019  Patient name: Kathleen [name]  Medical record number: [id number]  Date of birth: 03/25/1961   I have seen and evaluated Kathleen [name] and discussed their care with the Residency Team. Kathleen [name] is a 58 year old woman with asthma (PFT's 2009), prior tobacco use, and GERD.  Her breathing was last seen normal in early March and since then she has had episodes of dyspnea and productive cough.  She was treated in mid March in the ED with a prednisone course without complete resolution.  This current episode worsened 9 days ago during a trip with her boyfriend children from [place] and exposure to a dust storm.  Her symptoms on admission including dyspnea, productive cough, chest tightness, abdominal spasms from coughing, lower extremity edema, dyspnea on exertion, sinus congestion, and anxiety.  This morning, she endorses dyspnea on exertion when walking to the toilet.  She is anxious to go home today.  PMHx, Fam Hx, and/or Soc Hx : PFTs in 2009 showed an FEV1 over FVC ratio of 70%; an FEV1 of 99% predicted; a 25% decrease in FEV1 after administration of saline; and an 8% increase in her FEV1 after bronchodilators.  It was noted that she had variability between these PFTs in 2 prior PFTs.  Vitals:   04/01/19 0335 04/01/19 0809  BP:  (!) 134/93  Pulse:  85  Resp:    Temp: 98 F (36.7 C) 97.8 F (36.6 C)  SpO2:  98%  General: Sitting in bed, no respiratory distress, no tachypnea, no accessory muscle use, speaking in full sentences L decent airflow bilaterally but does have wheezing bilaterally  I personally viewed the CXR images and confirmed my reading with the official read.  AP portable semierect, slight overpenetration, no infiltrates or pulmonary edema  I personally viewed the EKG and confirmed my reading with the official read.  Sinus rhythm, normal axis, no interval prolongation, no ST-T wave changes.  Assessment and Plan: I have seen and evaluated the patient as outlined  above. I agree with the formulated Assessment and Plan as detailed in the residents' note, with the following changes: Kathleen [name] is a 58 year old woman with asthma who has not had control of her asthma since the early part of March despite a course of prednisone in mid March.  Her chest x-ray has ruled out a contributing pneumonia.  Her PFT's in 2009 suggest significant bronchial hyperresponsiveness and at most mild COPD.  She is on maximal therapy including LABA, LAMA, ICS, SABA, and montelukast.  She was able to verbalize proper technique with the team.  Affordability is an issue that our clinical pharmacist has been working with her to get affordable medications.  She is on a PPI at home.  BNP is 56, pulmonary auscultation does not reveal crackles, and her chest x-ray does not show pulmonary edema so heart failure exacerbation is highly unlikely.  It is not certain why she does not have good control of her asthma on this therapy.  Patient indicates that she has been referred to pulmonology but I cannot find that referral in our EHR.  Today, even though she still has wheezing she has good airflow, does not have hypoxia at rest, and is able to speak in full sentences.  We need to check her O2 sat with ambulation but there is a chance that she may be able to go home today.  1.  Asthma exacerbation - check oxygen saturation with ambulation.  Likely discharge to home on home regimen and  to complete a course of azithromycin.  Although she does not have an infiltrate and at most mild COPD, due to the duration of her symptoms, a course of antibiotics would be indicated.  She will also complete a course of prednisone 40 mg for a total of 5 days.  She will need follow-up in [facility].  Bartholomew Crews, MD 4/19/20209:24 AM

## 2019-04-01 NOTE — Progress Notes (Signed)
SATURATION QUALIFICATIONS: (This note is used to comply with regulatory documentation for home oxygen)  Patient Saturations on Room Air at Rest 95%  Patient Saturations on Room Air while Ambulating 93%  Patient Saturations on 0 Liters of oxygen while Ambulating 93%  Please briefly explain why patient needs home oxygen :Pt does not need home oxygen

## 2019-04-01 NOTE — Plan of Care (Signed)
°  Problem: Clinical Measurements: °Goal: Respiratory complications will improve °Outcome: Progressing °  °Problem: Pain Managment: °Goal: General experience of comfort will improve °Outcome: Progressing °  °Problem: Safety: °Goal: Ability to remain free from injury will improve °Outcome: Progressing °  °

## 2019-04-01 NOTE — Progress Notes (Addendum)
   Subjective: [name] did not get much rest last night. She felt wired from the steroids. She also had a few episodes of post-tussive emesis overnight but did not feel nauseous. Her cough is productive. No hematemesis. Endorses abdominal soreness from coughing, as well as intermittent abdominal spasms.  This morning she is feeling well. She's able to get up and go to the bathroom on her own. Still having dyspnea with exertion which causes her some anxiety. She was able to eat her breakfast and does feel less short of breath is much improved compared to yesterday.    Objective:  Vital signs in last 24 hours: Vitals:   03/31/19 1642 03/31/19 2200 03/31/19 2305 04/01/19 0335  BP: (!) 127/94 113/76 (!) 114/91   Pulse: (!) 108  89   Resp:  (!) 22 20   Temp: 98.3 F (36.8 C)  98 F (36.7 C) 98 F (36.7 C)  TempSrc: Oral  Oral Oral  SpO2: 100% 93% 100%   Weight:    62 kg  Height:    5\' 3"  (1.6 m)   General: awake, alert, sitting up in bed in NAD Cardio: RRR; no m/r/g Respiratory: improved air movement; bilateral wheezing and rhonchi  MSK: moves all extremities; no edema   Assessment/Plan:  Active Problems:   COPD exacerbation (HCC)  Katrine Coho is a 58yo female with PMH asthma, tobacco use (quit 2018), GERD and genearlized anxiety disorder presenting with nine days of worsening shortness of breath and cough with yellow sputum production presenting with COPD exacerbation after recent treatment for exacerbation which never completely resolved.   COPD Exacerbation Improved air movement with prednisone and azithromcyin. She is able to speak in full sentences and PE shows increased air movement compared to yesterday and is breathing on RA. She should be safe to discharge today after ambulating with O2 reading  - ambulate w/wo oxygen - she is out of her home medications - provided temporary samples including Spiriva Respimat 2.5 mcg 2 puffs per day (this is equivalent to her Spiriva  Handiheld 20mcg) and Breo Ellipta 200/25 (this is equivalent to her Dulera 200-5 mcg) and albuterol prn inhaler - will refill home medications which will be mailed to her - continue azithromycin five days total  - continue prednisone 40 mg five days total   VTE: lovenox IVF: none Diet: regular  Code: full   Dispo: Anticipated discharge in approximately today or tomorrow.   Marty Heck, DO 04/01/2019, 6:24 AM Pager: [phone]

## 2019-04-01 NOTE — Care Management (Signed)
Patient states that she will be able to pay for prescriptions. No other CM needs identified.

## 2019-04-01 NOTE — Discharge Summary (Signed)
Name: Kathleen [name] MRN: [id number] DOB: 07-19-1961 58 y.o. PCP: [name], MD  Date of Admission: 03/31/2019  8:52 AM Date of Discharge:  Attending Physician: Bartholomew Crews, MD  Discharge Diagnosis: 1. COPD Exacerbation   Discharge Medications: Allergies as of 04/01/2019      Reactions   Topiramate Other (See Comments)   Reaction:  Hallucinations    Tramadol Nausea Only      Medication List    TAKE these medications   albuterol 108 (90 Base) MCG/ACT inhaler Commonly known as:  VENTOLIN HFA Inhale 2 puffs into the lungs every 4 (four) hours as needed for wheezing or shortness of breath.   aspirin EC 81 MG tablet Take 1 tablet (81 mg total) by mouth daily.   azithromycin 250 MG tablet Commonly known as:  ZITHROMAX Take one tablet per day   azithromycin 250 MG tablet Commonly known as:  ZITHROMAX Take one tablet daily for four days   ipratropium-albuterol 0.5-2.5 (3) MG/3ML Soln Commonly known as:  DUONEB Take 3 mLs by nebulization every 6 (six) hours as needed.   lidocaine 5 % Commonly known as:  LIDODERM Place 1 patch onto the skin daily. Remove & Discard patch within 12 hours or as directed by MD   loratadine 10 MG tablet Commonly known as:  Claritin Take 1 tablet (10 mg total) by mouth daily.   mometasone-formoterol 200-5 MCG/ACT Aero Commonly known as:  Dulera Inhale 1 puff into the lungs 2 (two) times daily.   montelukast 10 MG tablet Commonly known as:  SINGULAIR Take 1 tablet (10 mg total) by mouth at bedtime.   omeprazole 20 MG capsule Commonly known as:  PRILOSEC Take 1 capsule (20 mg total) by mouth daily.   ondansetron 4 MG tablet Commonly known as:  ZOFRAN Take 1 tablet (4 mg total) by mouth every 8 (eight) hours as needed for nausea or vomiting.   ondansetron 8 MG disintegrating tablet Commonly known as:  Zofran ODT Take 1 tablet (8 mg total) by mouth every 8 (eight) hours as needed for nausea or vomiting.    oxyCODONE-acetaminophen 5-325 MG tablet Commonly known as:  PERCOCET/ROXICET Take 1 tablet by mouth every 6 (six) hours as needed for severe pain.   predniSONE 20 MG tablet Commonly known as:  DELTASONE Take 2 tablets (40 mg total) by mouth daily with breakfast. Start taking on:  April 02, 2019   sertraline 100 MG tablet Commonly known as:  ZOLOFT Take 100 mg by mouth 2 (two) times a day.   tiotropium 18 MCG inhalation capsule Commonly known as:  SPIRIVA Place 1 capsule (18 mcg total) into inhaler and inhale daily. What changed:  when to take this       Disposition and follow-up:   [name] was discharged from [place] in Stable condition.  At the hospital follow up visit please address:  1.  COPD Exacerbation: Likely asthma component as well, see PFTs from 2009. Ran out of meds prior to admission. D/c'd with equivalent medication samples including Breo Ellipta 200/25 mcg 1 puff per day, Spiriva Respimat 2.5 mcg 2 puffs per day, and Albuterol inhaler prn. Her home medications were refilled and will be mailed via [facility]. Also d/c'd with Prednisone 40 mg and azithromycin 250 MG to complete five days total. Previously had pulmonology referral scheduled but was cancelled due to [place]. May need second referral. Also had sleep study scheduled for possible OSA but has not been able  to complete due to COVID. Also needs repeat PFTs  2.  Labs / imaging needed at time of follow-up: PFTs  3.  Pending labs/ test needing follow-up: none  Follow-up Appointments:   Telehealth Appointment Internal Medicine Center April 24th  Hospital Course by problem list: Kathleen [name] is a 58yo female with PMH asthma/COPD, tobacco use (quit 2018), GERD and genearlized anxiety disorder presenting with nine days of worsening shortness of breath and cough with yellow sputum production diagnosed with COPD exacerbation, although likely asthma overlap as well. She required 2L O2 at  admission but was able to decrease to RA on day 2. She was treated one month ago for COPD exacerbation and symptoms never completely resolved. She ran out of her medications prior to admission as she was continuously using them to try and treat her symptoms. She was treated with methylprednisolone 125mg  in the ED and started on prednisone 40 mg and azithromycin and MDI albuterol-ipratropium prn, and symptoms improved significantly. She was ambulated on RA and O2 dropped to 93%. She was provided sample inhalers equivalent to her home medications, including Spiriva Respimat 2.5 mcg 2 puffs per day (this is equivalent to her Spiriva Handiheld 96mcg) and Breo Ellipta 200/25 (this is equivalent to her Dulera 200-5 mcg) and albuterol prn inhaler. Her home medications were ordered through [facility] and will be emailed to her. She will follow-up with the internal medicine center with telehealth visit on April 24th and was discharged with azithromycin and prednisone to complete 5 day dose.   Discharge Vitals:   BP (!) 134/93 (BP Location: Left Arm)   Pulse 85   Temp 97.8 F (36.6 C) (Oral)   Resp 20   Ht 5\' 3"  (1.6 m)   Wt 62 kg   LMP 11/08/2014   SpO2 95%   BMI 24.21 kg/m   Pertinent Labs, Studies, and Procedures:   CXR 4/19  Normal mediastinum and cardiac silhouette. Normal pulmonary vasculature. No evidence of effusion, infiltrate, or pneumothorax. No acute bony abnormality. IMPRESSION: Normal chest radiograph. Electronically Signed   By: Suzy Bouchard M.D.   On: 03/31/2019 10:18  Discharge Instructions: Discharge Instructions    Call MD for:  difficulty breathing, headache or visual disturbances   Complete by:  As directed    Call MD for:  extreme fatigue   Complete by:  As directed    Call MD for:  persistant dizziness or light-headedness   Complete by:  As directed    Diet - low sodium heart healthy   Complete by:  As directed    Discharge instructions   Complete by:  As directed     You were hospitalized for COPD Exacerbation. Thank you for allowing [place] to be part of your care.   We will arrange telehealth follow-up with the Internal Medicine Center. They will call you tomorrow to schedule an appointment.   Please note these changes made to your medications:   Please take prednisone (DELTASONE) 20 MG tablet - take one tablet with breakfast for 4 days starting tomorrow  Please take azithromycin (ZITHROMAX) 250 MG tablet - take one tablet per day for the next 4 days, starting this evening  Until your regular medications come in please take:  Breo Ellipta - take one puffs once per day  Spiriva Respimat - take two puffs once per day   I have provided refills for your regular COPD medications which will be sent in the mail.   Please call our clinic if you  have any questions or concerns, we may be able to help and keep you from a long and expensive emergency room wait. Our clinic and after hours phone number is [phone], the best time to call is Monday through Friday 9 am to 4 pm but there is always someone available 24/7 if you have an emergency. If you need medication refills please notify your pharmacy one week in advance and they will send [place] a request.   Increase activity slowly   Complete by:  As directed       Signed: Molli Hazard A, DO 04/01/2019, 12:16 PM   Pager: [phone]

## 2019-04-05 ENCOUNTER — Ambulatory Visit: Payer: Self-pay

## 2019-04-06 ENCOUNTER — Telehealth: Payer: Self-pay | Admitting: *Deleted

## 2019-04-06 ENCOUNTER — Ambulatory Visit (INDEPENDENT_AMBULATORY_CARE_PROVIDER_SITE_OTHER): Payer: Self-pay | Admitting: Internal Medicine

## 2019-04-06 ENCOUNTER — Other Ambulatory Visit: Payer: Self-pay

## 2019-04-06 DIAGNOSIS — J441 Chronic obstructive pulmonary disease with (acute) exacerbation: Secondary | ICD-10-CM

## 2019-04-06 DIAGNOSIS — R400 Somnolence: Secondary | ICD-10-CM | POA: Insufficient documentation

## 2019-04-06 NOTE — Telephone Encounter (Signed)
SPOKE WITH PATIENT REGARDING HER PULMONARY REFERRAL/ AT THIS TIME SHE HAS NO INSURANCE COVERAGE/ PER PATIENT SHE IS UNABLE TO PAY OUT OF POCKET FOR THIS SERVICE/ STATES THAT CASEWORKER FROM [place] IS WORKING ON GETTING MEDICAID AND DISABILITY FOR HER. STATES SHE ALSO TALKED WITH FC PERSON IN [facility]. SHE WANTS ALL REFERRALS TO BE PUT ON HOLD UNTIL SHE HEARS FROM THE CASE WORKER.

## 2019-04-06 NOTE — Assessment & Plan Note (Signed)
   COPD: Breathing doing well, she is sitting out on the porch today. She can walk about 30 feet without getting short of breath which is still less than her usual 50 feet but overall much improved from hospitalization. She is waiting for her new inhalers she should get them today per [facility].  She inquires about oxygen therapy but on chart review she does not meet qualifications.  She is using her nebulizer machine 4 times per day which is helping.  No fevers or chills.  Still some productive cough but improved.    P: Placed referral for sleep study, PFT's and pulmonology  Follow up in person visit in one month or PRN  As always, pt is advised that if symptoms worsen or new symptoms arise, they should go to an urgent care facility or to to ER for further evaluation.

## 2019-04-06 NOTE — Progress Notes (Signed)
Medicine attending: Medical history, presenting problems and medications, reviewed with resident physician Dr Guadlupe Spanish on the day of the patient telephone consultation and I concur with his evaluation and management plan. Post hospital follow up call recent admit for COPD exacerbation. She is doing well. Close to baseline status.

## 2019-04-06 NOTE — Telephone Encounter (Signed)
Call made to to [facility]-pt needs sleep study (split-night), order has been placed, but due to covid19 pandemic, they are not scheduling any studies until June.  They are able to see pt's order and will contact pt directly with an appt.Despina Hidden Cassady4/24/202010:07 AM  Call made to Respiratory Dept-pt needs PFTs( [phone])-order has been placed, but due to covid19 pandemic, they are not scheduling any studies until June.  They are able to see order in epic and will contact pt directly with an appt.Regenia Skeeter, Darlene Cassady4/24/202010:20 AM   Referral also placed for pulmonologist.

## 2019-04-06 NOTE — Assessment & Plan Note (Signed)
Pt requires refills on medications or re-referral for studies that were canceled during the coronavirus with associated diagnosis above.  Reviewed disease process and find this medication/study to be necessary, will not change dose or alter current therapy.

## 2019-04-06 NOTE — Progress Notes (Addendum)
   [facility] Residency Telephone Encounter  Reason for call:   This telephone encounter was created for [name] on 04/06/2019 for the following purpose/cc HFU COPD.   Pertinent Data:   Recent admission for COPD exacerbation ROS: Pulmonary: pt denies increased work of breathing, shortness of breath is close to baseline  Cardiac: pt denies palpitations, chest pain,   Abdominal: pt denies abdominal pain, nausea, vomiting, or diarrhea   Assessment / Plan / Recommendations:   COPD: Breathing doing well, she is sitting out on the porch today. She can walk about 30 feet without getting short of breath which is still less than her usual 50 feet but overall much improved from hospitalization. She is waiting for her new inhalers she should get them today per [facility].  She inquires about oxygen therapy but on chart review she does not meet qualifications.  She is using her nebulizer machine 4 times per day which is helping.  No fevers or chills.  Still some productive cough but improved.    P: Placed referral for sleep study, PFT's and pulmonology  Follow up in person visit in one month or PRN  As always, pt is advised that if symptoms worsen or new symptoms arise, they should go to an urgent care facility or to to ER for further evaluation.   Consent and Medical Decision Making:   Patient discussed with Dr. Beryle Beams  This is a telephone encounter between Kathleen [name] and Vickki Muff on 04/06/2019 for HFU COPD exacerbation. The visit was conducted with the patient located at home and Vickki Muff at [facility]. The patient's identity was confirmed using their DOB and current address. The patient has consented to being evaluated through a telephone encounter and understands the associated risks (an examination cannot be done and the patient may need to come in for an appointment) / benefits (allows the patient to remain at home, decreasing exposure to coronavirus).  I personally spent 15 minutes on medical discussion.

## 2019-04-16 ENCOUNTER — Other Ambulatory Visit: Payer: Self-pay | Admitting: Internal Medicine

## 2019-04-16 ENCOUNTER — Encounter (HOSPITAL_COMMUNITY): Payer: Self-pay | Admitting: Emergency Medicine

## 2019-04-16 ENCOUNTER — Other Ambulatory Visit: Payer: Self-pay

## 2019-04-16 ENCOUNTER — Emergency Department (HOSPITAL_COMMUNITY)
Admission: EM | Admit: 2019-04-16 | Discharge: 2019-04-16 | Disposition: A | Discharge status: Home / Self Care | Payer: Self-pay | Attending: Emergency Medicine | Admitting: Emergency Medicine

## 2019-04-16 DIAGNOSIS — J45901 Unspecified asthma with (acute) exacerbation: Secondary | ICD-10-CM

## 2019-04-16 DIAGNOSIS — Z7982 Long term (current) use of aspirin: Secondary | ICD-10-CM | POA: Insufficient documentation

## 2019-04-16 DIAGNOSIS — J4541 Moderate persistent asthma with (acute) exacerbation: Secondary | ICD-10-CM | POA: Insufficient documentation

## 2019-04-16 DIAGNOSIS — Z87891 Personal history of nicotine dependence: Secondary | ICD-10-CM | POA: Insufficient documentation

## 2019-04-16 DIAGNOSIS — J449 Chronic obstructive pulmonary disease, unspecified: Secondary | ICD-10-CM | POA: Insufficient documentation

## 2019-04-16 DIAGNOSIS — Z79899 Other long term (current) drug therapy: Secondary | ICD-10-CM | POA: Insufficient documentation

## 2019-04-16 MED ORDER — PREDNISONE 20 MG PO TABS
60.0000 mg | ORAL_TABLET | Freq: Once | ORAL | Status: AC
  Administered 2019-04-16: 60 mg via ORAL
  Filled 2019-04-16: qty 3

## 2019-04-16 MED ORDER — TIOTROPIUM BROMIDE MONOHYDRATE 18 MCG IN CAPS: 18.0000 ug | ORAL_CAPSULE | Freq: Every day | RESPIRATORY_TRACT | 1 refills | Status: DC

## 2019-04-16 MED ORDER — PREDNISONE 20 MG PO TABS: 40.0000 mg | ORAL_TABLET | Freq: Every day | ORAL | 0 refills | Status: DC

## 2019-04-16 MED ORDER — MOMETASONE FURO-FORMOTEROL FUM 200-5 MCG/ACT IN AERO
2.0000 | INHALATION_SPRAY | Freq: Two times a day (BID) | RESPIRATORY_TRACT | Status: DC
  Administered 2019-04-16: 2 via RESPIRATORY_TRACT
  Filled 2019-04-16: qty 8.8

## 2019-04-16 MED ORDER — ALBUTEROL SULFATE HFA 108 (90 BASE) MCG/ACT IN AERS
3.0000 | INHALATION_SPRAY | RESPIRATORY_TRACT | Status: DC | PRN
  Administered 2019-04-16: 3 via RESPIRATORY_TRACT

## 2019-04-16 MED ORDER — ALBUTEROL SULFATE HFA 108 (90 BASE) MCG/ACT IN AERS: 2.0000 | INHALATION_SPRAY | RESPIRATORY_TRACT | 1 refills | Status: DC | PRN

## 2019-04-16 MED ORDER — ALBUTEROL SULFATE HFA 108 (90 BASE) MCG/ACT IN AERS
INHALATION_SPRAY | RESPIRATORY_TRACT | Status: AC
  Filled 2019-04-16: qty 6.7

## 2019-04-16 MED ORDER — IPRATROPIUM-ALBUTEROL 0.5-2.5 (3) MG/3ML IN SOLN: 3.0000 mL | Freq: Four times a day (QID) | RESPIRATORY_TRACT | 1 refills | Status: DC | PRN

## 2019-04-16 NOTE — ED Provider Notes (Signed)
[facility] EMERGENCY DEPARTMENT Provider Note   CSN: [id number] Arrival date & time: 04/16/19  1559    History   Chief Complaint Chief Complaint  Patient presents with  . Asthma    HPI Kathleen [name] is a 58 y.o. female.     HPI Patient presents with shortness of breath.  History of asthma and COPD.  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  States she thinks she got triggered from that.  Chest feels tight but no chest pain.  No fevers.  Occasional cough without production.  States this feels like her asthma.  States she is out of some of her medicines.  States they need to be filled.  States they were sent in for refill but has not gotten them yet.  States if she does get some steroids and inhaler she can feel much better. Past Medical History:  Diagnosis Date  . Asthma    2 sets of PFT's in 04/09 without sign variability. Last set with significant decrease in FEV1 with saline alone, suggesting Asthma but  recommended clinical corelation   . Asthma   . Bipolar 1 disorder [facility])    therapist is Caryl Asp and is followed by [employer]  . Blackout    negative work up including ESR, ANA, opthalmology referral, carotid dopplers, 2D echo, MRI and EEG.  . BREAST LUMP 03/25/2008   Annotation: bilaterally Qualifier: Diagnosis of  By: Tomasa Hosteller MD, Edmon Crape.   . 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 Bronchitis   . Chronic headache   . Chronic low back pain 08/08/2008   Qualifier: Diagnosis of  By: Redmond Pulling  MD, Mateo Flow    . Chronic pain    normal work up including TSH, RPR, B12, HIV, plain films, 2 ESR's, ANA, CK, RF along with routine CBC, CMET and UA. Further work up includes CRP, ESR, SPEP/UPEP, hepatitis erology, A1C , repeat ANA  . COPD (chronic obstructive pulmonary disease) ([place])   . DUB (dysfunctional uterine bleeding)    and pelvic pain, with negative endometrial bx in 07/09 and transvaginal U/S  significant for mild fibroids in 0/09.  Marland Kitchen GERD (gastroesophageal reflux disease)   . Hallucinations 03/18/2008   Qualifier: Diagnosis of  By: Redmond Pulling  MD, Mateo Flow    . Lower extremity edema    Neg ABI's, normal 2D echo, normal albumin  . Menorrhagia   . Ovarian cyst   . Polysubstance abuse ([place])    none since March 17,2009.  Marland Kitchen Sleep apnea    NO CPAP  . Tubular adenoma of colon     Patient Active Problem List   Diagnosis Date Noted  . Daytime somnolence 04/06/2019  . COPD exacerbation ([place]) 03/31/2019  . Numbness and tingling in right hand 03/09/2019  . Lumbar radiculopathy 07/26/2016  . Depression 09/19/2015  . Hot flashes 02/05/2015  . Screening for breast cancer 06/18/2014  . Asthma 05/13/2014  . 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 BIPOLAR DISORDER UNSPECIFIED 01/21/2010  . GERD 01/21/2010  . ARTHRITIS, GENERALIZED 10/08/2008  . GENERALIZED ANXIETY DISORDER 03/14/2008  . DRUG ABUSE 03/14/2008  . Migraine variant 03/14/2008    Past Surgical History:  Procedure Laterality Date  . CESAREAN SECTION    . CESAREAN SECTION    . HERNIA REPAIR    . Left partial oophorectomy    . OOPHORECTOMY     1/2 ovary removed  . VAGINA  SURGERY     mesh     OB History    Gravida  3   Para  2   Term  2   Preterm  0   AB  1   Living  2     SAB  0   TAB  1   Ectopic  0   Multiple      Live Births  2            Home Medications    Prior to Admission medications   Medication Sig Start Date End Date Taking? Authorizing Provider  albuterol (VENTOLIN HFA) 108 (90 Base) MCG/ACT inhaler Inhale 2 puffs into the lungs every 4 (four) hours as needed for wheezing or shortness of breath. 04/16/19   Aldine Contes, MD  aspirin EC 81 MG tablet Take 1 tablet (81 mg total) by mouth daily. 01/15/19   [name], MD  azithromycin (ZITHROMAX) 250 MG tablet Take one tablet per day 04/01/19   Seawell, Jaimie A, DO  azithromycin (ZITHROMAX) 250 MG tablet Take one tablet  daily for four days 04/01/19   Seawell, Jaimie A, DO  ipratropium-albuterol (DUONEB) 0.5-2.5 (3) MG/3ML SOLN Take 3 mLs by nebulization every 6 (six) hours as needed. 04/16/19   Aldine Contes, MD  lidocaine (LIDODERM) 5 % Place 1 patch onto the skin daily. Remove & Discard patch within 12 hours or as directed by MD    [provider]  loratadine (CLARITIN) 10 MG tablet Take 1 tablet (10 mg total) by mouth daily. 01/15/19 04/15/19  [name], MD  mometasone-formoterol (DULERA) 200-5 MCG/ACT AERO Inhale 1 puff into the lungs 2 (two) times daily. 04/01/19   Seawell, Jaimie A, DO  montelukast (SINGULAIR) 10 MG tablet Take 1 tablet (10 mg total) by mouth at bedtime. 12/28/18   Kathi Ludwig, MD  omeprazole (PRILOSEC) 20 MG capsule Take 1 capsule (20 mg total) by mouth daily. 12/28/18 12/28/19  Kathi Ludwig, MD  ondansetron (ZOFRAN ODT) 8 MG disintegrating tablet Take 1 tablet (8 mg total) by mouth every 8 (eight) hours as needed for nausea or vomiting. Patient not taking: Reported on 02/27/2019 01/07/19   [name], MD  ondansetron (ZOFRAN) 4 MG tablet Take 1 tablet (4 mg total) by mouth every 8 (eight) hours as needed for nausea or vomiting. 02/27/19   Fawze, Mina A, PA-C  oxyCODONE-acetaminophen (PERCOCET/ROXICET) 5-325 MG tablet Take 1 tablet by mouth every 6 (six) hours as needed for severe pain.    [provider]  predniSONE (DELTASONE) 20 MG tablet Take 2 tablets (40 mg total) by mouth daily. 04/16/19   Davonna Belling, MD  sertraline (ZOLOFT) 100 MG tablet Take 100 mg by mouth 2 (two) times a day.    [provider]  tiotropium (SPIRIVA) 18 MCG inhalation capsule Place 1 capsule (18 mcg total) into inhaler and inhale daily. 04/16/19   Davonna Belling, MD    Family History Family History  Problem Relation Age of Onset  . Colon cancer Mother 24  . Breast cancer Mother 57  . Diabetes Father   . Hypertension Father   . Kidney disease Father   . Colon  cancer Father 61  . Prostate cancer Father   . Cancer Father   . Kidney disease Sister   . Cancer Brother   . Breast cancer Maternal Grandmother 70    Social History Social History   Tobacco Use  . Smoking status: Former Smoker    Packs/day: 0.25  Years: 30.00    Pack years: 7.50    Types: Cigarettes    Last attempt to quit: 12/28/2016    Years since quitting: 2.2  . Smokeless tobacco: Never Used  Substance Use Topics  . Alcohol use: Yes    Alcohol/week: 0.0 standard drinks    Comment: socailly  . Drug use: No    Types: Marijuana    Comment: former     Allergies   Topiramate and Tramadol   Review of Systems Review of Systems  Constitutional: Negative for appetite change.  HENT: Negative for congestion.   Respiratory: Positive for cough, shortness of breath and wheezing.   Cardiovascular: Negative for chest pain.  Gastrointestinal: Negative for abdominal distention.  Genitourinary: Negative for flank pain.  Musculoskeletal: Negative for back pain.  Neurological: Negative for weakness.  Psychiatric/Behavioral: Negative for confusion.     Physical Exam Updated Vital Signs BP 106/75   Pulse 85   Temp 98.2 F (36.8 C) (Oral)   Resp 18   Ht '5\' 3"'  (1.6 m)   Wt 62 kg   LMP 11/08/2014   SpO2 98%   BMI 24.21 kg/m   Physical Exam Vitals signs and nursing note reviewed.  Constitutional:      Appearance: Normal appearance.  HENT:     Head: Normocephalic.     Mouth/Throat:     Mouth: Mucous membranes are moist.  Eyes:     Extraocular Movements: Extraocular movements intact.  Cardiovascular:     Rate and Rhythm: Regular rhythm.  Pulmonary:     Comments: Fuhs wheezes and prolonged expirations. Abdominal:     Tenderness: There is no abdominal tenderness.  Musculoskeletal:        General: No tenderness.     Right lower leg: No edema.     Left lower leg: No edema.  Skin:    General: Skin is warm.     Capillary Refill: Capillary refill takes less than  2 seconds.  Neurological:     Mental Status: Mental status is at baseline.      ED Treatments / Results  Labs (all labs ordered are listed, but only abnormal results are displayed) Labs Reviewed - No data to display  EKG None  Radiology No results found.  Procedures Procedures (including critical care time)  Medications Ordered in ED Medications  albuterol (VENTOLIN HFA) 108 (90 Base) MCG/ACT inhaler (has no administration in time range)  albuterol (VENTOLIN HFA) 108 (90 Base) MCG/ACT inhaler 3 puff (3 puffs Inhalation Given 04/16/19 1658)  mometasone-formoterol (DULERA) 200-5 MCG/ACT inhaler 2 puff (2 puffs Inhalation Given 04/16/19 1740)  predniSONE (DELTASONE) tablet 60 mg (60 mg Oral Given 04/16/19 1657)     Initial Impression / Assessment and Plan / ED Course  I have reviewed the triage vital signs and the nursing notes.  Pertinent labs & imaging results that were available during my care of the patient were reviewed by me and considered in my medical decision making (see chart for details).        Patient shortness of breath.  Likely triggered by being outside.  Lungs have wheezing but nonfocal.  Feels better after treatment.  Will discharge home with some refills of her medicines and steroids.  Discharge  Final Clinical Impressions(s) / ED Diagnoses   Final diagnoses:  Moderate persistent asthma with exacerbation    ED Discharge Orders         Ordered    tiotropium (SPIRIVA) 18 MCG inhalation capsule  Daily  04/16/19 1747    predniSONE (DELTASONE) 20 MG tablet  Daily     04/16/19 1747           Davonna Belling, MD 04/16/19 1750

## 2019-04-16 NOTE — ED Triage Notes (Signed)
Pt in SOB with audible wheezes present,  Pt is tripoding, pt speaks in short sentences, Charge RN aware of need for room, Ray, MD at bedside

## 2019-04-16 NOTE — Telephone Encounter (Signed)
Refill Request-Pt has not rec'd her medications since her d/c and last visit 04/06/2019. Please call patient backl   [facility] [place], [place] - [address], [address]     albuterol (VENTOLIN HFA) 108 (90 Base) MCG/ACT inhaler  ipratropium-albuterol (DUONEB) 0.5-2.5 (3) MG/3ML SOLN  predniSONE (DELTASONE) 20 MG tablet  tiotropium (SPIRIVA) 18 MCG inhalation capsule

## 2019-04-16 NOTE — ED Notes (Signed)
Pt used albuterol inhaler x 3 puffs in triage with improvement in symptoms

## 2019-04-17 ENCOUNTER — Telehealth: Payer: Self-pay | Admitting: *Deleted

## 2019-04-17 NOTE — Telephone Encounter (Signed)
Spoke to pt r/t ED visit 5/4 and COPD, she states the ED md brought up some issues to her about meds and possibly needing home 02 to have w/ her. telehealth appt set for fri 5/8 at [address] [facility]

## 2019-04-18 NOTE — Telephone Encounter (Signed)
Thank you :)

## 2019-04-20 ENCOUNTER — Ambulatory Visit (INDEPENDENT_AMBULATORY_CARE_PROVIDER_SITE_OTHER): Payer: Self-pay | Admitting: Internal Medicine

## 2019-04-20 ENCOUNTER — Other Ambulatory Visit: Payer: Self-pay

## 2019-04-20 DIAGNOSIS — J441 Chronic obstructive pulmonary disease with (acute) exacerbation: Secondary | ICD-10-CM

## 2019-04-20 NOTE — Progress Notes (Signed)
   This is a telephone encounter between Kathleen [name] and Kathleen [name] on 04/20/2019 for follow-up on her ED visit. The visit was conducted with the patient located at home and [place] at [facility]. The patient's identity was confirmed using their DOB and current address. The patient has consented to being evaluated through a telephone encounter and understands the associated risks (an examination cannot be done and the patient may need to come in for an appointment) / benefits (allows the patient to remain at home, decreasing exposure to coronavirus). I personally spent 10 minutes on medical discussion.   CC: Follow-up of her recent ED visit due to asthma exacerbation.  HPI:  Kathleen [name] is a 58 y.o. with past medical history as listed below was given a call to follow-up on her recent ED visit 2 days ago due to asthma exacerbation.  She was given a course of prednisone which she has not started yet but states that since that breathing treatment and IV prednisone her breathing has been improved. She was also asking for oxygen tank for home, per patient she was told in ED that she needs oxygen at home.  No documentation on ED note. Per patient she does become short of breath with short distance and even with talking.  She was able to talk in full sentences without any difficulty on phone. She denies any worsening cough, fever or chills, sick contact or recent travel.  Please see assessment and plan for her chronic conditions.  Past Medical History:  Diagnosis Date  . Asthma    2 sets of PFT's in 04/09 without sign variability. Last set with significant decrease in FEV1 with saline alone, suggesting Asthma but  recommended clinical corelation   . Asthma   . Bipolar 1 disorder [facility])    therapist is Caryl Asp and is followed by [employer]  . Blackout    negative work up including ESR, ANA, opthalmology referral, carotid dopplers, 2D echo, MRI and EEG.  . BREAST LUMP 03/25/2008   Annotation: bilaterally Qualifier: Diagnosis of  By: Tomasa Hosteller MD, Edmon Crape.   . 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 Bronchitis   . Chronic headache   . Chronic low back pain 08/08/2008   Qualifier: Diagnosis of  By: Redmond Pulling  MD, Mateo Flow    . Chronic pain    normal work up including TSH, RPR, B12, HIV, plain films, 2 ESR's, ANA, CK, RF along with routine CBC, CMET and UA. Further work up includes CRP, ESR, SPEP/UPEP, hepatitis erology, A1C , repeat ANA  . COPD (chronic obstructive pulmonary disease) ([place])   . DUB (dysfunctional uterine bleeding)    and pelvic pain, with negative endometrial bx in 07/09 and transvaginal U/S significant for mild fibroids in 0/09.  Marland Kitchen GERD (gastroesophageal reflux disease)   . Hallucinations 03/18/2008   Qualifier: Diagnosis of  By: Redmond Pulling  MD, Mateo Flow    . Lower extremity edema    Neg ABI's, normal 2D echo, normal albumin  . Menorrhagia   . Ovarian cyst   . Polysubstance abuse ([place])    none since March 17,2009.  Marland Kitchen Sleep apnea    NO CPAP  . Tubular adenoma of colon    Review of Systems: Negative except mentioned in HPI.   Assessment & Plan:   See Encounters Tab for problem based charting.  Patient discussed with Dr. Beryle Beams.

## 2019-04-20 NOTE — Progress Notes (Signed)
Medicine attending: Medical history, presenting problems, physical complaints, and medications, reviewed with resident physician Dr Lorella Nimrod on the day of the patient telephone consultation and I concur with her evaluation and management plan. Follow up call post recent ED visit for asthma exacerbation. Still subjective dyspnea. Talking on phone with no suspicion of respiratory distress. She did not fill Rx for prescribed steroids yet. Encouraged to do so; continue bronchodilators; [facility] visit next week here.

## 2019-04-20 NOTE — Assessment & Plan Note (Signed)
Most likely she has a recent COPD exacerbation. Not started her prednisone yet as she has not received them through her mail-in pharmacy. She was also concerned about her oxygen requirement-no documentation. She was able to communicate well and does not appear short of breath on phone.  She was speaking in full sentences without any difficulty.  -Advised her to call her mail-in pharmacy and take prednisone for 5 days as directed. -Will continue her inhalers as directed. -She was asked to make an [facility] appointment to evaluate for her oxygen needs.

## 2019-04-24 ENCOUNTER — Telehealth: Payer: Self-pay | Admitting: Pharmacy Technician

## 2019-04-24 DIAGNOSIS — J45901 Unspecified asthma with (acute) exacerbation: Secondary | ICD-10-CM

## 2019-04-24 DIAGNOSIS — J441 Chronic obstructive pulmonary disease with (acute) exacerbation: Secondary | ICD-10-CM

## 2019-04-26 ENCOUNTER — Ambulatory Visit: Payer: Self-pay

## 2019-04-26 MED ORDER — FLUTICASONE PROPIONATE 50 MCG/ACT NA SUSP: 1.0000 | Freq: Every day | NASAL | 11 refills | Status: DC

## 2019-04-26 MED ORDER — MONTELUKAST SODIUM 10 MG PO TABS: 10.0000 mg | ORAL_TABLET | Freq: Every day | ORAL | 3 refills | Status: DC

## 2019-04-26 MED ORDER — TIOTROPIUM BROMIDE MONOHYDRATE 2.5 MCG/ACT IN AERS: 2.0000 | INHALATION_SPRAY | Freq: Every day | RESPIRATORY_TRACT | 0 refills | Status: DC

## 2019-04-26 MED ORDER — MOMETASONE FURO-FORMOTEROL FUM 200-5 MCG/ACT IN AERO: 2.0000 | INHALATION_SPRAY | Freq: Two times a day (BID) | RESPIRATORY_TRACT | 3 refills | Status: DC

## 2019-04-26 MED ORDER — LORATADINE 10 MG PO TABS: 10.0000 mg | ORAL_TABLET | Freq: Every day | ORAL | 11 refills | Status: DC

## 2019-04-26 NOTE — Telephone Encounter (Addendum)
Attempted to call patient to see if she would like a sample of Spiriva inhaler and to inform her that we sent a prescription for a one-time free inhaler to her pharmacy. Call was disconnected as she answered. Left message and callback number.  Brendolyn Patty, PharmD PGY1 Pharmacy Resident Phone [phone]  04/26/2019   3:36 PM

## 2019-04-26 NOTE — Progress Notes (Addendum)
Contacted patient for medication management follow up. She reports severe shortness of breath and unable to talk much over the phone. She states she has an [facility] clinic appointment later this afternoon but is trying to present to clinic earlier for support. She was hospitalized 1 month ago for acute respiratory distress with hypoxia and has since completed azithromycin/prednisone course.  She states she is unable to access Spiriva and is currently taking Dulera 3 puffs daily instead of 1 puff BID due to symptoms. Advised patient ok to increase Dulera to 2 puffs BID (COPD/asthma overlap), and will collaborate with clinic team to provide Spiriva samples. I will also enroll patient in Spiriva hospital to home program (1 free inhaler) and manufacturer patient assistance for 1-year free access. Patient provided consent to complete paperwork on her behalf (homeless, living with family, 0 income, does not file taxes).

## 2019-04-27 ENCOUNTER — Emergency Department (HOSPITAL_COMMUNITY)
Admission: EM | Admit: 2019-04-27 | Discharge: 2019-04-27 | Disposition: A | Discharge status: Home / Self Care | Payer: Self-pay | Attending: Emergency Medicine | Admitting: Emergency Medicine

## 2019-04-27 ENCOUNTER — Other Ambulatory Visit: Payer: Self-pay

## 2019-04-27 ENCOUNTER — Encounter (HOSPITAL_COMMUNITY): Payer: Self-pay | Admitting: Emergency Medicine

## 2019-04-27 ENCOUNTER — Emergency Department (HOSPITAL_COMMUNITY): Payer: Self-pay

## 2019-04-27 DIAGNOSIS — R609 Edema, unspecified: Secondary | ICD-10-CM | POA: Insufficient documentation

## 2019-04-27 DIAGNOSIS — Z79899 Other long term (current) drug therapy: Secondary | ICD-10-CM | POA: Insufficient documentation

## 2019-04-27 DIAGNOSIS — Z87891 Personal history of nicotine dependence: Secondary | ICD-10-CM | POA: Insufficient documentation

## 2019-04-27 DIAGNOSIS — J441 Chronic obstructive pulmonary disease with (acute) exacerbation: Secondary | ICD-10-CM | POA: Insufficient documentation

## 2019-04-27 LAB — BASIC METABOLIC PANEL
Anion gap: 11 (ref 5–15)
BUN: 9 mg/dL (ref 6–20)
CO2: 23 mmol/L (ref 22–32)
Calcium: 9.2 mg/dL (ref 8.9–10.3)
Chloride: 106 mmol/L (ref 98–111)
Creatinine, Ser: 1.5 mg/dL — ABNORMAL HIGH (ref 0.44–1.00)
GFR calc Af Amer: 44 mL/min — ABNORMAL LOW (ref >60)
GFR calc non Af Amer: 38 mL/min — ABNORMAL LOW (ref >60)
Glucose, Bld: 90 mg/dL (ref 70–99)
Potassium: 4.1 mmol/L (ref 3.5–5.1)
Sodium: 140 mmol/L (ref 135–145)

## 2019-04-27 LAB — CBC WITH DIFFERENTIAL/PLATELET
Abs Immature Granulocytes: 0.03 10*3/uL (ref 0.00–0.07)
Basophils Absolute: 0.1 10*3/uL (ref 0.0–0.1)
Basophils Relative: 1 %
Eosinophils Absolute: 0.3 10*3/uL (ref 0.0–0.5)
Eosinophils Relative: 3 %
HCT: 41.5 % (ref 36.0–46.0)
Hemoglobin: 13.6 g/dL (ref 12.0–15.0)
Immature Granulocytes: 0 %
Lymphocytes Relative: 38 %
Lymphs Abs: 3.3 10*3/uL (ref 0.7–4.0)
MCH: 29.1 pg (ref 26.0–34.0)
MCHC: 32.8 g/dL (ref 30.0–36.0)
MCV: 88.7 fL (ref 80.0–100.0)
Monocytes Absolute: 0.7 10*3/uL (ref 0.1–1.0)
Monocytes Relative: 8 %
Neutro Abs: 4.4 10*3/uL (ref 1.7–7.7)
Neutrophils Relative %: 50 %
Platelets: 322 10*3/uL (ref 150–400)
RBC: 4.68 MIL/uL (ref 3.87–5.11)
RDW: 13.6 % (ref 11.5–15.5)
WBC: 8.9 10*3/uL (ref 4.0–10.5)
nRBC: 0 % (ref 0.0–0.2)

## 2019-04-27 LAB — BRAIN NATRIURETIC PEPTIDE: B Natriuretic Peptide: 32.1 pg/mL (ref 0.0–100.0)

## 2019-04-27 LAB — D-DIMER, QUANTITATIVE: D-Dimer, Quant: 0.28 ug/mL-FEU (ref 0.00–0.50)

## 2019-04-27 LAB — TROPONIN I: Troponin I: <0.03 ng/mL (ref <0.03)

## 2019-04-27 MED ORDER — METHYLPREDNISOLONE SODIUM SUCC 125 MG IJ SOLR
125.0000 mg | Freq: Once | INTRAMUSCULAR | Status: AC
  Administered 2019-04-27: 125 mg via INTRAVENOUS
  Filled 2019-04-27: qty 2

## 2019-04-27 MED ORDER — AEROCHAMBER PLUS FLO-VU LARGE MISC
Status: AC
  Administered 2019-04-27: 1
  Filled 2019-04-27: qty 1

## 2019-04-27 MED ORDER — EPINEPHRINE 0.3 MG/0.3ML IJ SOAJ: 0.3000 mg | INTRAMUSCULAR | 0 refills | Status: DC | PRN

## 2019-04-27 MED ORDER — PREDNISONE 10 MG PO TABS: ORAL_TABLET | ORAL | 0 refills | Status: AC

## 2019-04-27 MED ORDER — ALBUTEROL SULFATE HFA 108 (90 BASE) MCG/ACT IN AERS
4.0000 | INHALATION_SPRAY | Freq: Once | RESPIRATORY_TRACT | Status: AC
  Administered 2019-04-27: 4 via RESPIRATORY_TRACT
  Filled 2019-04-27: qty 6.7

## 2019-04-27 NOTE — ED Notes (Signed)
E-signature not available, verbalized understanding of DC instructions and prescriptions.  

## 2019-04-27 NOTE — ED Notes (Signed)
Pt pulse oximetry while ambulating was 95%. Pt stated she was SOB and dizzy while ambulating.

## 2019-04-27 NOTE — ED Triage Notes (Signed)
Pt here for eval of asthma exacerbation that has been persistent for about a week. Pt also reports ankle swelling which is new for her. She states she has been using her nebulizer's with no relief.

## 2019-04-27 NOTE — ED Notes (Signed)
Patient refuses rectal temp at this time.  Provider aware.

## 2019-04-27 NOTE — ED Notes (Signed)
Pt informed the nurse that she was stung by bees yesterday morning that precipitated the respiratory event.

## 2019-04-27 NOTE — ED Provider Notes (Addendum)
Kathleen [name]   CSN: [id number] Arrival date & time: 04/27/19  1758    History   Chief Complaint Chief Complaint  Patient presents with  . Shortness of Breath  . Leg Swelling    HPI Kathleen [name] is a 58 y.o. female.     The history is provided by the patient and medical records. No language interpreter was used.  Shortness of Breath   Kathleen [name] is a 58 y.o. female who presents to the Emergency Department complaining of sob. She presents to the emergency department complaining of shortness of breath and asthma exacerbation that began last night. She reports acute on chronic shortness of breath with associated chest tightness as well as lower extremity edema. The edema she noticed this morning. She denies any fevers, cough, hemoptysis. No known sick contacts. She reports four recent negative COVID test. She was admitted to the hospital in the last month and did have a change in her inhalers at that time. She states she is compliant with her home medications. Symptoms are severe and constant in nature. Past Medical History:  Diagnosis Date  . Asthma    2 sets of PFT's in 04/09 without sign variability. Last set with significant decrease in FEV1 with saline alone, suggesting Asthma but  recommended clinical corelation   . Asthma   . Bipolar 1 disorder [facility])    therapist is Caryl Asp and is followed by [employer]  . Blackout    negative work up including ESR, ANA, opthalmology referral, carotid dopplers, 2D echo, MRI and EEG.  . BREAST LUMP 03/25/2008   Annotation: bilaterally Qualifier: Diagnosis of  By: Tomasa Hosteller MD, Edmon Crape.   . 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 Bronchitis   . Chronic headache   . Chronic low back pain 08/08/2008   Qualifier: Diagnosis of  By: Redmond Pulling  MD, Mateo Flow    . Chronic pain    normal work up including TSH, RPR, B12, HIV, plain films, 2  ESR's, ANA, CK, RF along with routine CBC, CMET and UA. Further work up includes CRP, ESR, SPEP/UPEP, hepatitis erology, A1C , repeat ANA  . COPD (chronic obstructive pulmonary disease) ([place])   . DUB (dysfunctional uterine bleeding)    and pelvic pain, with negative endometrial bx in 07/09 and transvaginal U/S significant for mild fibroids in 0/09.  Marland Kitchen GERD (gastroesophageal reflux disease)   . Hallucinations 03/18/2008   Qualifier: Diagnosis of  By: Redmond Pulling  MD, Mateo Flow    . Lower extremity edema    Neg ABI's, normal 2D echo, normal albumin  . Menorrhagia   . Ovarian cyst   . Polysubstance abuse ([place])    none since March 17,2009.  Marland Kitchen Sleep apnea    NO CPAP  . Tubular adenoma of colon     Patient Active Problem List   Diagnosis Date Noted  . Daytime somnolence 04/06/2019  . COPD exacerbation ([place]) 03/31/2019  . Numbness and tingling in right hand 03/09/2019  . Lumbar radiculopathy 07/26/2016  . Depression 09/19/2015  . Hot flashes 02/05/2015  . Screening for breast cancer 06/18/2014  . Asthma 05/13/2014  . 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 BIPOLAR DISORDER UNSPECIFIED 01/21/2010  . GERD 01/21/2010  . ARTHRITIS, GENERALIZED 10/08/2008  . GENERALIZED ANXIETY DISORDER 03/14/2008  . DRUG ABUSE 03/14/2008  . Migraine variant 03/14/2008    Past Surgical History:  Procedure Laterality  Date  . CESAREAN SECTION    . CESAREAN SECTION    . HERNIA REPAIR    . Left partial oophorectomy    . OOPHORECTOMY     1/2 ovary removed  . VAGINA SURGERY     mesh     OB History    Gravida  3   Para  2   Term  2   Preterm  0   AB  1   Living  2     SAB  0   TAB  1   Ectopic  0   Multiple      Live Births  2            Home Medications    Prior to Admission medications   Medication Sig Start Date End Date Taking? Authorizing Provider  albuterol (VENTOLIN HFA) 108 (90 Base) MCG/ACT inhaler Inhale 2 puffs into the lungs every 4 (four) hours as needed for  wheezing or shortness of breath. 04/16/19  Yes Aldine Contes, MD  aspirin EC 81 MG tablet Take 1 tablet (81 mg total) by mouth daily. 01/15/19  Yes Helberg, Larkin Ina, MD  azithromycin (ZITHROMAX) 250 MG tablet Take one tablet per day 04/01/19   Molli Hazard A, DO  azithromycin (ZITHROMAX) 250 MG tablet Take one tablet daily for four days 04/01/19   Seawell, Jaimie A, DO  EPINEPHrine 0.3 mg/0.3 mL IJ SOAJ injection Inject 0.3 mLs (0.3 mg total) into the muscle as needed for anaphylaxis. 04/27/19   Quintella Reichert, MD  fluticasone [facility]) 50 MCG/ACT nasal spray Place 1 spray into both nostrils daily. 04/26/19 04/25/20  Forde Dandy, PharmD  ipratropium-albuterol (DUONEB) 0.5-2.5 (3) MG/3ML SOLN Take 3 mLs by nebulization every 6 (six) hours as needed. 04/16/19   Aldine Contes, MD  lidocaine (LIDODERM) 5 % Place 1 patch onto the skin daily. Remove & Discard patch within 12 hours or as directed by MD    [provider]  loratadine (CLARITIN) 10 MG tablet Take 1 tablet (10 mg total) by mouth daily. 04/26/19   Forde Dandy, PharmD  mometasone-formoterol (DULERA) 200-5 MCG/ACT AERO Inhale 2 puffs into the lungs 2 (two) times daily. 04/26/19   Sid Falcon, MD  montelukast (SINGULAIR) 10 MG tablet Take 1 tablet (10 mg total) by mouth at bedtime. 04/26/19   Sid Falcon, MD  omeprazole (PRILOSEC) 20 MG capsule Take 1 capsule (20 mg total) by mouth daily. 12/28/18 12/28/19  Kathi Ludwig, MD  ondansetron (ZOFRAN ODT) 8 MG disintegrating tablet Take 1 tablet (8 mg total) by mouth every 8 (eight) hours as needed for nausea or vomiting. Patient not taking: Reported on 02/27/2019 01/07/19   [name], MD  ondansetron (ZOFRAN) 4 MG tablet Take 1 tablet (4 mg total) by mouth every 8 (eight) hours as needed for nausea or vomiting. 02/27/19   Fawze, Mina A, PA-C  oxyCODONE-acetaminophen (PERCOCET/ROXICET) 5-325 MG tablet Take 1 tablet by mouth every 6 (six) hours as needed for severe pain.     [provider]  predniSONE (DELTASONE) 10 MG tablet Take 3 tablets (30 mg total) by mouth daily for 3 days, THEN 2 tablets (20 mg total) daily for 3 days, THEN 1 tablet (10 mg total) daily for 3 days. 04/27/19 05/06/19  Quintella Reichert, MD  sertraline (ZOLOFT) 100 MG tablet Take 100 mg by mouth 2 (two) times a day.    [provider]  Tiotropium Bromide Monohydrate (SPIRIVA RESPIMAT) 2.5 MCG/ACT AERS Inhale 2 puffs  into the lungs daily. 04/26/19   Sid Falcon, MD    Family History Family History  Problem Relation Age of Onset  . Colon cancer Mother 66  . Breast cancer Mother 25  . Diabetes Father   . Hypertension Father   . Kidney disease Father   . Colon cancer Father 35  . Prostate cancer Father   . Cancer Father   . Kidney disease Sister   . Cancer Brother   . Breast cancer Maternal Grandmother 33    Social History Social History   Tobacco Use  . Smoking status: Former Smoker    Packs/day: 0.25    Years: 30.00    Pack years: 7.50    Types: Cigarettes    Last attempt to quit: 12/28/2016    Years since quitting: 2.3  . Smokeless tobacco: Never Used  Substance Use Topics  . Alcohol use: Yes    Alcohol/week: 0.0 standard drinks    Comment: socailly  . Drug use: No    Types: Marijuana    Comment: former     Allergies   Topiramate and Tramadol   Review of Systems Review of Systems  Respiratory: Positive for shortness of breath.   All other systems reviewed and are negative.    Physical Exam Updated Vital Signs BP 112/75   Pulse 78   Temp 99.1 F (37.3 C) (Oral)   Resp 20   LMP 11/08/2014   SpO2 94%   Physical Exam Vitals signs and nursing [name] reviewed.  Constitutional:      Appearance: She is well-developed.  HENT:     Head: Normocephalic and atraumatic.  Cardiovascular:     Rate and Rhythm: Normal rate and regular rhythm.     Heart sounds: No murmur.  Pulmonary:     Effort: Pulmonary effort is normal. No respiratory  distress.     Comments: Diffuse wheezing. Slightly hoarse voice (patient states this chronic). Abdominal:     Palpations: Abdomen is soft.     Tenderness: There is no abdominal tenderness. There is no guarding or rebound.  Musculoskeletal:        General: No tenderness.     Comments: 2+ nonpitting edema to BLE  Skin:    General: Skin is warm and dry.     Capillary Refill: Capillary refill takes less than 2 seconds.  Neurological:     Mental Status: She is alert and oriented to person, place, and time.  Psychiatric:        Mood and Affect: Mood normal.        Behavior: Behavior normal.      ED Treatments / Results  Labs (all labs ordered are listed, but only abnormal results are displayed) Labs Reviewed  BASIC METABOLIC PANEL - Abnormal; Notable for the following components:      Result Value   Creatinine, Ser 1.50 (*)    GFR calc non Af Amer 38 (*)    GFR calc Af Amer 44 (*)    All other components within normal limits  CBC WITH DIFFERENTIAL/PLATELET  TROPONIN I  BRAIN NATRIURETIC PEPTIDE  D-DIMER, QUANTITATIVE (NOT AT [facility])    EKG None  Radiology Dg Chest Port 1 View  Result Date: 04/27/2019 CLINICAL DATA:  Shortness of breath. Lower extremity edema. EXAM: PORTABLE CHEST 1 VIEW COMPARISON:  Radiograph 03/31/2019 FINDINGS: The cardiomediastinal contours are normal. The lungs are clear. Pulmonary vasculature is normal. No pulmonary edema. No consolidation, pleural effusion, or pneumothorax. No acute osseous abnormalities are  seen. IMPRESSION: No acute chest findings. Electronically Signed   By: Keith Rake M.D.   On: 04/27/2019 19:21    Procedures Procedures (including critical care time)  Medications Ordered in ED Medications  methylPREDNISolone sodium succinate (SOLU-MEDROL) 125 mg/2 mL injection 125 mg (125 mg Intravenous Given 04/27/19 1828)  albuterol (VENTOLIN HFA) 108 (90 Base) MCG/ACT inhaler 4 puff (4 puffs Inhalation Given 04/27/19 1828)  AeroChamber  Plus Flo-Vu Large MISC (1 each  Given 04/27/19 1832)     Initial Impression / Assessment and Plan / ED Course  I have reviewed the triage vital signs and the nursing notes.  Pertinent labs & imaging results that were available during my care of the patient were reviewed by me and considered in my medical decision making (see chart for details).       Patient with history of COPD here for evaluation of shortness of breath and lower extremity edema. She does have wheezing on examination with no respiratory distress. She is mildly hoarse but states this is her baseline voice due to allergies, no stridor on exam. She does have lower extremity edema with no evidence of acute CHF. On repeat assessment she does state that she was signed by multiple bees yesterday. Her left arm does have multiple small bite areas with minimal local swelling. Current presentation is not consistent with anaphylaxis. Her breathing is improved after treatment in the emergency department. Plan to discharge home with outpatient follow-up and return precautions. Will prescribe EpiPen in the event she gets another bee sting and has significant difficulty breathing from the sting.    Presentation is not consistent with PE, pneumonia, respiratory failure. Discussed with patient importance of outpatient follow-up and return precautions.  Discussed with patient recommendation for COVID test and she declines test.   Katrine Coho was evaluated in Emergency Department on 04/27/2019 for the symptoms described in the history of present illness. She was evaluated in the context of the global COVID-19 pandemic, which necessitated consideration that the patient might be at risk for infection with the SARS-CoV-2 virus that causes COVID-19. Institutional protocols and algorithms that pertain to the evaluation of patients at risk for COVID-19 are in a state of rapid change based on information released by regulatory bodies including the CDC and  federal and state organizations. These policies and algorithms were followed during the patient's care in the ED.  Final Clinical Impressions(s) / ED Diagnoses   Final diagnoses:  Peripheral edema  COPD exacerbation [facility])    ED Discharge Orders         Ordered    predniSONE (DELTASONE) 10 MG tablet     04/27/19 2145    EPINEPHrine 0.3 mg/0.3 mL IJ SOAJ injection  As needed     04/27/19 2145           Quintella Reichert, MD 04/27/19 2307    Quintella Reichert, MD 04/27/19 2309

## 2019-05-04 ENCOUNTER — Encounter: Payer: Self-pay | Admitting: *Deleted

## 2019-05-08 MED ORDER — TIOTROPIUM BROMIDE MONOHYDRATE 2.5 MCG/ACT IN AERS: 2.0000 | INHALATION_SPRAY | Freq: Every day | RESPIRATORY_TRACT | 11 refills | Status: DC

## 2019-05-08 NOTE — Addendum Note (Signed)
Addended by: Forde Dandy on: 05/08/2019 11:39 AM   Modules accepted: Orders

## 2019-05-15 ENCOUNTER — Encounter: Payer: Self-pay | Admitting: Pharmacist

## 2019-05-15 NOTE — Progress Notes (Signed)
Patient was approved for Spiriva patient assistance program for free 1-year supply.

## 2019-05-16 NOTE — Progress Notes (Signed)
Great. Thank you.

## 2019-05-22 ENCOUNTER — Other Ambulatory Visit: Payer: Self-pay

## 2019-05-22 ENCOUNTER — Ambulatory Visit (INDEPENDENT_AMBULATORY_CARE_PROVIDER_SITE_OTHER): Payer: Self-pay | Admitting: Internal Medicine

## 2019-05-22 ENCOUNTER — Telehealth: Payer: Self-pay | Admitting: Internal Medicine

## 2019-05-22 VITALS — BP 110/88 | HR 79 | Temp 98.2°F | Ht 62.0 in | Wt 144.8 lb

## 2019-05-22 DIAGNOSIS — Z79899 Other long term (current) drug therapy: Secondary | ICD-10-CM

## 2019-05-22 DIAGNOSIS — J4541 Moderate persistent asthma with (acute) exacerbation: Secondary | ICD-10-CM

## 2019-05-22 DIAGNOSIS — Z0000 Encounter for general adult medical examination without abnormal findings: Secondary | ICD-10-CM

## 2019-05-22 DIAGNOSIS — J441 Chronic obstructive pulmonary disease with (acute) exacerbation: Secondary | ICD-10-CM

## 2019-05-22 DIAGNOSIS — M533 Sacrococcygeal disorders, not elsewhere classified: Secondary | ICD-10-CM

## 2019-05-22 DIAGNOSIS — Z7951 Long term (current) use of inhaled steroids: Secondary | ICD-10-CM

## 2019-05-22 DIAGNOSIS — G8921 Chronic pain due to trauma: Secondary | ICD-10-CM

## 2019-05-22 DIAGNOSIS — J45909 Unspecified asthma, uncomplicated: Secondary | ICD-10-CM

## 2019-05-22 DIAGNOSIS — Z1239 Encounter for other screening for malignant neoplasm of breast: Secondary | ICD-10-CM

## 2019-05-22 MED ORDER — LIDOCAINE 5 % EX PTCH: 1.0000 | MEDICATED_PATCH | CUTANEOUS | 1 refills | Status: DC

## 2019-05-22 NOTE — Patient Instructions (Addendum)
Thank you for allowing [place] to care for you  For your asthma - Call [phone] and ask [facility] for early refill - Until refill arrive take the following - Breo: 2 puff daily - Spiriva 2-4 puffs daily - Albuterol: As needed  For your back pain - Refill of lidocaine patches provided today  Please follow up with your PCP in about 1-2 months

## 2019-05-22 NOTE — Assessment & Plan Note (Signed)
Screening mammogram ordered

## 2019-05-22 NOTE — Assessment & Plan Note (Signed)
Patient reports a history of sacral back from a fall year ago. She states she uses lidocaine patches for pain and is requesting a refill. She does have an old Rx for these patches. She states they provide relief. Will provide refill and have patient discuss ongoing pain management with PCP - Refill Lidocaine patches - PCP follow up for management of chronic pain

## 2019-05-22 NOTE — Progress Notes (Signed)
   CC: Asthma, Sacral back pain   HPI:  [name] is a 58 y.o. F with PMHx listed below presenting for Asthma, Sacral back pain. Please see the A&P for the status of the patient's chronic medical problems.   Past Medical History:  Diagnosis Date  . Asthma    2 sets of PFT's in 04/09 without sign variability. Last set with significant decrease in FEV1 with saline alone, suggesting Asthma but  recommended clinical corelation   . Asthma   . Bipolar 1 disorder [facility])    therapist is Caryl Asp and is followed by [employer]  . Blackout    negative work up including ESR, ANA, opthalmology referral, carotid dopplers, 2D echo, MRI and EEG.  . BREAST LUMP 03/25/2008   Annotation: bilaterally Qualifier: Diagnosis of  By: Tomasa Hosteller MD, Edmon Crape.   . 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 Bronchitis   . Chronic headache   . Chronic low back pain 08/08/2008   Qualifier: Diagnosis of  By: Redmond Pulling  MD, Mateo Flow    . Chronic pain    normal work up including TSH, RPR, B12, HIV, plain films, 2 ESR's, ANA, CK, RF along with routine CBC, CMET and UA. Further work up includes CRP, ESR, SPEP/UPEP, hepatitis erology, A1C , repeat ANA  . COPD (chronic obstructive pulmonary disease) ([place])   . DUB (dysfunctional uterine bleeding)    and pelvic pain, with negative endometrial bx in 07/09 and transvaginal U/S significant for mild fibroids in 0/09.  Marland Kitchen GERD (gastroesophageal reflux disease)   . Hallucinations 03/18/2008   Qualifier: Diagnosis of  By: Redmond Pulling  MD, Mateo Flow    . Lower extremity edema    Neg ABI's, normal 2D echo, normal albumin  . Menorrhagia   . Ovarian cyst   . Polysubstance abuse ([place])    none since March 17,2009.  Marland Kitchen Sleep apnea    NO CPAP  . Tubular adenoma of colon    Review of Systems:  Performed and all others negative.  Physical Exam:  Vitals:   05/22/19 1322  BP: 110/88  Pulse: 79  Temp: 98.2 F (36.8 C)  TempSrc: Oral   SpO2: 100%  Weight: 144 lb 12.8 oz (65.7 kg)  Height: '5\' 2"'$  (1.575 m)   Physical Exam Constitutional:      General: She is not in acute distress.    Appearance: Normal appearance.  Cardiovascular:     Rate and Rhythm: Normal rate and regular rhythm.     Pulses: Normal pulses.     Heart sounds: Normal heart sounds.  Pulmonary:     Effort: Pulmonary effort is normal. No respiratory distress.     Breath sounds: Wheezing present. No rhonchi or rales.     Comments: Mild diffuse expiratory wheezing Abdominal:     General: Bowel sounds are normal. There is no distension.     Palpations: Abdomen is soft.     Tenderness: There is no abdominal tenderness.  Musculoskeletal:        General: No swelling or deformity.  Skin:    General: Skin is warm and dry.  Neurological:     General: No focal deficit present.     Mental Status: Mental status is at baseline.    Assessment & Plan:    See Encounters Tab for problem based charting.  Patient discussed with Dr. Evette Doffing

## 2019-05-22 NOTE — Telephone Encounter (Signed)
Pt requesting a call back.  Pt seen today in [place].  Pt requesting a COVID-19 letter stating she is a "High Risk" patient for the COVID-19 to be written for her employer as soon as possible.

## 2019-05-22 NOTE — Assessment & Plan Note (Signed)
Patient presenting for refills of her asthma medications. She states she has run out due to first running out of her albuterol and then starting to use her [facility] as a rescue inhaler. She was counseled on the importance of using her daily inhaler's daily and only using her albuterol as a rescue. If she runs out of albuterol early, she is to contact us for treatment of any exacerbation or refill.  She is also working on starting Spiriva, which she was recently approved to receive for free through the manufacturer (but has not received this yet). She is not SOB today and is oxygenating at 100% on RA. There are mild diffuse wheezes bilat. She appears comfortable. Will provide samples today and have patient call [place] for early refill of her regular inhalers - Samples: Breo 2 puff daily, Spiriva 2 puffs daily, Albuterol PRN - Continue Dulera, Spiriva, Singulair, and Albuterol - Call [facility] for early refills

## 2019-05-23 NOTE — Progress Notes (Signed)
Internal Medicine Clinic Attending  Case discussed with Dr. Melvin  at the time of the visit.  We reviewed the resident's history and exam and pertinent patient test results.  I agree with the assessment, diagnosis, and plan of care documented in the resident's note.  

## 2019-05-23 NOTE — Telephone Encounter (Signed)
Letter provided

## 2019-05-23 NOTE — Telephone Encounter (Signed)
Pt is calling regarding letter; pls contact

## 2019-05-30 ENCOUNTER — Ambulatory Visit: Payer: Self-pay

## 2019-06-05 ENCOUNTER — Encounter (HOSPITAL_BASED_OUTPATIENT_CLINIC_OR_DEPARTMENT_OTHER): Payer: Self-pay

## 2019-06-08 ENCOUNTER — Other Ambulatory Visit: Payer: Self-pay

## 2019-06-08 ENCOUNTER — Observation Stay (HOSPITAL_COMMUNITY)
Admission: EM | Admit: 2019-06-08 | Discharge: 2019-06-09 | Disposition: A | Discharge status: Home / Self Care | Payer: Self-pay | Attending: Internal Medicine | Admitting: Internal Medicine

## 2019-06-08 ENCOUNTER — Encounter (HOSPITAL_COMMUNITY): Payer: Self-pay | Admitting: Emergency Medicine

## 2019-06-08 ENCOUNTER — Observation Stay (HOSPITAL_COMMUNITY): Payer: Self-pay

## 2019-06-08 ENCOUNTER — Emergency Department (HOSPITAL_COMMUNITY): Payer: Self-pay

## 2019-06-08 DIAGNOSIS — F1910 Other psychoactive substance abuse, uncomplicated: Secondary | ICD-10-CM | POA: Insufficient documentation

## 2019-06-08 DIAGNOSIS — Z888 Allergy status to other drugs, medicaments and biological substances status: Secondary | ICD-10-CM | POA: Insufficient documentation

## 2019-06-08 DIAGNOSIS — Z716 Tobacco abuse counseling: Secondary | ICD-10-CM | POA: Insufficient documentation

## 2019-06-08 DIAGNOSIS — M545 Low back pain: Secondary | ICD-10-CM | POA: Insufficient documentation

## 2019-06-08 DIAGNOSIS — F411 Generalized anxiety disorder: Secondary | ICD-10-CM | POA: Diagnosis present

## 2019-06-08 DIAGNOSIS — F17210 Nicotine dependence, cigarettes, uncomplicated: Secondary | ICD-10-CM | POA: Insufficient documentation

## 2019-06-08 DIAGNOSIS — Z79899 Other long term (current) drug therapy: Secondary | ICD-10-CM | POA: Insufficient documentation

## 2019-06-08 DIAGNOSIS — Z7982 Long term (current) use of aspirin: Secondary | ICD-10-CM | POA: Insufficient documentation

## 2019-06-08 DIAGNOSIS — R1310 Dysphagia, unspecified: Secondary | ICD-10-CM | POA: Insufficient documentation

## 2019-06-08 DIAGNOSIS — F32A Depression, unspecified: Secondary | ICD-10-CM | POA: Diagnosis present

## 2019-06-08 DIAGNOSIS — Z885 Allergy status to narcotic agent status: Secondary | ICD-10-CM | POA: Insufficient documentation

## 2019-06-08 DIAGNOSIS — J449 Chronic obstructive pulmonary disease, unspecified: Secondary | ICD-10-CM | POA: Diagnosis present

## 2019-06-08 DIAGNOSIS — K219 Gastro-esophageal reflux disease without esophagitis: Secondary | ICD-10-CM | POA: Diagnosis present

## 2019-06-08 DIAGNOSIS — Z1159 Encounter for screening for other viral diseases: Secondary | ICD-10-CM | POA: Insufficient documentation

## 2019-06-08 DIAGNOSIS — G4733 Obstructive sleep apnea (adult) (pediatric): Secondary | ICD-10-CM | POA: Insufficient documentation

## 2019-06-08 DIAGNOSIS — G8929 Other chronic pain: Secondary | ICD-10-CM | POA: Insufficient documentation

## 2019-06-08 DIAGNOSIS — Z7951 Long term (current) use of inhaled steroids: Secondary | ICD-10-CM | POA: Insufficient documentation

## 2019-06-08 DIAGNOSIS — J45901 Unspecified asthma with (acute) exacerbation: Secondary | ICD-10-CM

## 2019-06-08 DIAGNOSIS — F319 Bipolar disorder, unspecified: Secondary | ICD-10-CM | POA: Insufficient documentation

## 2019-06-08 DIAGNOSIS — F329 Major depressive disorder, single episode, unspecified: Secondary | ICD-10-CM | POA: Diagnosis present

## 2019-06-08 DIAGNOSIS — J441 Chronic obstructive pulmonary disease with (acute) exacerbation: Principal | ICD-10-CM

## 2019-06-08 LAB — CBC WITH DIFFERENTIAL/PLATELET
Abs Immature Granulocytes: 0.01 10*3/uL (ref 0.00–0.07)
Basophils Absolute: 0 10*3/uL (ref 0.0–0.1)
Basophils Relative: 1 %
Eosinophils Absolute: 0.5 10*3/uL (ref 0.0–0.5)
Eosinophils Relative: 8 %
HCT: 40.1 % (ref 36.0–46.0)
Hemoglobin: 12.7 g/dL (ref 12.0–15.0)
Immature Granulocytes: 0 %
Lymphocytes Relative: 28 %
Lymphs Abs: 1.8 10*3/uL (ref 0.7–4.0)
MCH: 28.9 pg (ref 26.0–34.0)
MCHC: 31.7 g/dL (ref 30.0–36.0)
MCV: 91.1 fL (ref 80.0–100.0)
Monocytes Absolute: 0.4 10*3/uL (ref 0.1–1.0)
Monocytes Relative: 6 %
Neutro Abs: 3.8 10*3/uL (ref 1.7–7.7)
Neutrophils Relative %: 57 %
Platelets: 253 10*3/uL (ref 150–400)
RBC: 4.4 MIL/uL (ref 3.87–5.11)
RDW: 13 % (ref 11.5–15.5)
WBC: 6.5 10*3/uL (ref 4.0–10.5)
nRBC: 0 % (ref 0.0–0.2)

## 2019-06-08 LAB — BASIC METABOLIC PANEL
Anion gap: 7 (ref 5–15)
BUN: 19 mg/dL (ref 6–20)
CO2: 25 mmol/L (ref 22–32)
Calcium: 8.6 mg/dL — ABNORMAL LOW (ref 8.9–10.3)
Chloride: 110 mmol/L (ref 98–111)
Creatinine, Ser: 1.21 mg/dL — ABNORMAL HIGH (ref 0.44–1.00)
GFR calc Af Amer: 57 mL/min — ABNORMAL LOW (ref >60)
GFR calc non Af Amer: 49 mL/min — ABNORMAL LOW (ref >60)
Glucose, Bld: 97 mg/dL (ref 70–99)
Potassium: 3.9 mmol/L (ref 3.5–5.1)
Sodium: 142 mmol/L (ref 135–145)

## 2019-06-08 LAB — SARS CORONAVIRUS 2 BY RT PCR (HOSPITAL ORDER, PERFORMED IN ~~LOC~~ HOSPITAL LAB): SARS Coronavirus 2: NEGATIVE

## 2019-06-08 MED ORDER — ALBUTEROL SULFATE (2.5 MG/3ML) 0.083% IN NEBU: 2.5000 mg | INHALATION_SOLUTION | Freq: Four times a day (QID) | RESPIRATORY_TRACT | Status: DC | PRN

## 2019-06-08 MED ORDER — IPRATROPIUM-ALBUTEROL 0.5-2.5 (3) MG/3ML IN SOLN: 3.0000 mL | Freq: Four times a day (QID) | RESPIRATORY_TRACT | 1 refills | Status: DC | PRN

## 2019-06-08 MED ORDER — ASPIRIN EC 81 MG PO TBEC
81.0000 mg | DELAYED_RELEASE_TABLET | Freq: Every day | ORAL | Status: DC
  Administered 2019-06-08 – 2019-06-09 (×2): 81 mg via ORAL
  Filled 2019-06-08 (×2): qty 1

## 2019-06-08 MED ORDER — SERTRALINE HCL 50 MG PO TABS
100.0000 mg | ORAL_TABLET | Freq: Two times a day (BID) | ORAL | Status: DC
  Administered 2019-06-08 – 2019-06-09 (×3): 100 mg via ORAL
  Filled 2019-06-08 (×4): qty 2

## 2019-06-08 MED ORDER — PANTOPRAZOLE SODIUM 40 MG PO TBEC
40.0000 mg | DELAYED_RELEASE_TABLET | Freq: Every day | ORAL | Status: DC
  Administered 2019-06-08 – 2019-06-09 (×2): 40 mg via ORAL
  Filled 2019-06-08 (×2): qty 1

## 2019-06-08 MED ORDER — PREDNISONE 20 MG PO TABS: 60.0000 mg | ORAL_TABLET | Freq: Every day | ORAL | 0 refills | Status: DC

## 2019-06-08 MED ORDER — LORATADINE 10 MG PO TABS
10.0000 mg | ORAL_TABLET | Freq: Every day | ORAL | Status: DC
  Administered 2019-06-08 – 2019-06-09 (×2): 10 mg via ORAL
  Filled 2019-06-08 (×2): qty 1

## 2019-06-08 MED ORDER — SODIUM CHLORIDE 0.9% FLUSH: 3.0000 mL | INTRAVENOUS | Status: DC | PRN

## 2019-06-08 MED ORDER — ALBUTEROL SULFATE HFA 108 (90 BASE) MCG/ACT IN AERS
2.0000 | INHALATION_SPRAY | Freq: Once | RESPIRATORY_TRACT | Status: AC
  Administered 2019-06-08: 2 via RESPIRATORY_TRACT
  Filled 2019-06-08: qty 6.7

## 2019-06-08 MED ORDER — MAGNESIUM SULFATE 2 GM/50ML IV SOLN
2.0000 g | Freq: Once | INTRAVENOUS | Status: AC
  Administered 2019-06-08: 2 g via INTRAVENOUS
  Filled 2019-06-08: qty 50

## 2019-06-08 MED ORDER — KETOROLAC TROMETHAMINE 15 MG/ML IJ SOLN
15.0000 mg | Freq: Four times a day (QID) | INTRAMUSCULAR | Status: DC | PRN
  Administered 2019-06-08: 15 mg via INTRAVENOUS
  Filled 2019-06-08: qty 1

## 2019-06-08 MED ORDER — NICOTINE 21 MG/24HR TD PT24
21.0000 mg | MEDICATED_PATCH | Freq: Every day | TRANSDERMAL | Status: DC
  Administered 2019-06-08 – 2019-06-09 (×2): 21 mg via TRANSDERMAL
  Filled 2019-06-08 (×2): qty 1

## 2019-06-08 MED ORDER — ALBUTEROL SULFATE (2.5 MG/3ML) 0.083% IN NEBU: 2.5000 mg | INHALATION_SOLUTION | RESPIRATORY_TRACT | Status: DC | PRN

## 2019-06-08 MED ORDER — LIDOCAINE 5 % EX PTCH
1.0000 | MEDICATED_PATCH | CUTANEOUS | Status: DC
  Administered 2019-06-08 – 2019-06-09 (×2): 1 via TRANSDERMAL
  Filled 2019-06-08 (×2): qty 1

## 2019-06-08 MED ORDER — ALBUTEROL SULFATE (2.5 MG/3ML) 0.083% IN NEBU
2.5000 mg | INHALATION_SOLUTION | Freq: Four times a day (QID) | RESPIRATORY_TRACT | Status: DC
  Administered 2019-06-08: 2.5 mg via RESPIRATORY_TRACT
  Filled 2019-06-08 (×2): qty 3

## 2019-06-08 MED ORDER — UMECLIDINIUM BROMIDE 62.5 MCG/INH IN AEPB
1.0000 | INHALATION_SPRAY | Freq: Every day | RESPIRATORY_TRACT | Status: DC
  Administered 2019-06-09: 1 via RESPIRATORY_TRACT
  Filled 2019-06-08: qty 7

## 2019-06-08 MED ORDER — METHYLPREDNISOLONE SODIUM SUCC 125 MG IJ SOLR
125.0000 mg | Freq: Once | INTRAMUSCULAR | Status: AC
  Administered 2019-06-08: 125 mg via INTRAMUSCULAR
  Filled 2019-06-08: qty 2

## 2019-06-08 MED ORDER — SODIUM CHLORIDE 0.9% FLUSH
3.0000 mL | Freq: Two times a day (BID) | INTRAVENOUS | Status: DC
  Administered 2019-06-08 (×2): 3 mL via INTRAVENOUS

## 2019-06-08 MED ORDER — TIOTROPIUM BROMIDE MONOHYDRATE 2.5 MCG/ACT IN AERS: 2.0000 | INHALATION_SPRAY | Freq: Every day | RESPIRATORY_TRACT | Status: DC

## 2019-06-08 MED ORDER — SODIUM CHLORIDE 0.9 % IV SOLN
500.0000 mg | Freq: Every day | INTRAVENOUS | Status: DC
  Administered 2019-06-08 – 2019-06-09 (×2): 500 mg via INTRAVENOUS
  Filled 2019-06-08 (×2): qty 500

## 2019-06-08 MED ORDER — ALBUTEROL SULFATE (2.5 MG/3ML) 0.083% IN NEBU
5.0000 mg | INHALATION_SOLUTION | Freq: Once | RESPIRATORY_TRACT | Status: AC
  Administered 2019-06-08: 5 mg via RESPIRATORY_TRACT
  Filled 2019-06-08: qty 6

## 2019-06-08 MED ORDER — OXYCODONE-ACETAMINOPHEN 5-325 MG PO TABS
1.0000 | ORAL_TABLET | Freq: Four times a day (QID) | ORAL | Status: DC | PRN
  Administered 2019-06-08 – 2019-06-09 (×2): 1 via ORAL
  Filled 2019-06-08 (×2): qty 1

## 2019-06-08 MED ORDER — SODIUM CHLORIDE 0.9 % IV SOLN: 250.0000 mL | INTRAVENOUS | Status: DC | PRN

## 2019-06-08 MED ORDER — MONTELUKAST SODIUM 10 MG PO TABS
10.0000 mg | ORAL_TABLET | Freq: Every day | ORAL | Status: DC
  Administered 2019-06-08: 10 mg via ORAL
  Filled 2019-06-08: qty 1

## 2019-06-08 MED ORDER — ALBUTEROL SULFATE (2.5 MG/3ML) 0.083% IN NEBU
2.5000 mg | INHALATION_SOLUTION | Freq: Four times a day (QID) | RESPIRATORY_TRACT | Status: DC
  Administered 2019-06-08 – 2019-06-09 (×5): 2.5 mg via RESPIRATORY_TRACT
  Filled 2019-06-08 (×4): qty 3

## 2019-06-08 MED ORDER — ENOXAPARIN SODIUM 40 MG/0.4ML ~~LOC~~ SOLN
40.0000 mg | SUBCUTANEOUS | Status: DC
  Administered 2019-06-08 – 2019-06-09 (×2): 40 mg via SUBCUTANEOUS
  Filled 2019-06-08 (×3): qty 0.4

## 2019-06-08 MED ORDER — FLUTICASONE PROPIONATE 50 MCG/ACT NA SUSP
1.0000 | Freq: Every day | NASAL | Status: DC
  Filled 2019-06-08: qty 16

## 2019-06-08 MED ORDER — METHYLPREDNISOLONE SODIUM SUCC 125 MG IJ SOLR
80.0000 mg | Freq: Three times a day (TID) | INTRAMUSCULAR | Status: DC
  Administered 2019-06-08 – 2019-06-09 (×3): 80 mg via INTRAVENOUS
  Filled 2019-06-08 (×4): qty 2

## 2019-06-08 MED ORDER — MOMETASONE FURO-FORMOTEROL FUM 200-5 MCG/ACT IN AERO
2.0000 | INHALATION_SPRAY | Freq: Two times a day (BID) | RESPIRATORY_TRACT | Status: DC
  Administered 2019-06-08 – 2019-06-09 (×2): 2 via RESPIRATORY_TRACT
  Filled 2019-06-08: qty 8.8

## 2019-06-08 NOTE — H&P (Signed)
TRH H&P    Patient Demographics:    Kathleen [name], is a 58 y.o. female  MRN: [id number]  DOB - [birth year]  Admit Date - 06/08/2019  Referring MD/NP/PA:  Shanon Rosser  Outpatient Primary MD for the patient is [name], MD  Patient coming from: home  Chief complaint- cough, dyspnea, wheezing   HPI:    Kathleen [name]  is a 58 y.o. female, w Bipolar do, chronic back pain, polysubstance abuse, Gerd, OSA, Copd/ Asthma,  Apparently presents with dyspnea, cough (dry), and wheezing. Pt states ran out of her albuterol inhaler.  Pt denies fever, chills, cp, palp, n/v, abd pain, diarrhea, brbpr, black stool.   In Ed,  T 98.2, P 79 R 36  Bp 112/71  Pox 95%  Wt 65.8 kg  CXR IMPRESSION: No active cardiopulmonary disease.  Labs pending  Pt treated with solumedrol , zithromax, albuterol neb, albuterol HFA 2puff and magnesium without relief.   Pt will be admitted for Copd exacerbation.      Review of systems:    In addition to the HPI above,  No Fever-chills, No Headache, No changes with Vision or hearing, No problems swallowing food or Liquids, No Chest pain, No Abdominal pain, No Nausea or Vomiting, bowel movements are regular, No Blood in stool or Urine, No dysuria, No new skin rashes or bruises, No new joints pains-aches,  No new weakness, tingling, numbness in any extremity, No recent weight gain or loss, No polyuria, polydypsia or polyphagia, No significant Mental Stressors.  All other systems reviewed and are negative.    Past History of the following :    Past Medical History:  Diagnosis Date  . Asthma    2 sets of PFT's in 04/09 without sign variability. Last set with significant decrease in FEV1 with saline alone, suggesting Asthma but  recommended clinical corelation   . Asthma   . Bipolar 1 disorder [facility])    therapist is Caryl Asp and is followed by [employer]  .  Blackout    negative work up including ESR, ANA, opthalmology referral, carotid dopplers, 2D echo, MRI and EEG.  . BREAST LUMP 03/25/2008   Annotation: bilaterally Qualifier: Diagnosis of  By: Tomasa Hosteller MD, Edmon Crape.   . 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 Bronchitis   . Chronic headache   . Chronic low back pain 08/08/2008   Qualifier: Diagnosis of  By: Redmond Pulling  MD, Mateo Flow    . Chronic pain    normal work up including TSH, RPR, B12, HIV, plain films, 2 ESR's, ANA, CK, RF along with routine CBC, CMET and UA. Further work up includes CRP, ESR, SPEP/UPEP, hepatitis erology, A1C , repeat ANA  . COPD (chronic obstructive pulmonary disease) ([place])   . COPD exacerbation ([place]) 03/31/2019  . DUB (dysfunctional uterine bleeding)    and pelvic pain, with negative endometrial bx in 07/09 and transvaginal U/S significant for mild fibroids in 0/09.  Marland Kitchen GERD (gastroesophageal reflux disease)   .  Hallucinations 03/18/2008   Qualifier: Diagnosis of  By: Redmond Pulling  MD, Mateo Flow    . Lower extremity edema    Neg ABI's, normal 2D echo, normal albumin  . Menorrhagia   . Ovarian cyst   . Polysubstance abuse ([place])    none since March 17,2009.  Marland Kitchen Sleep apnea    NO CPAP  . Tubular adenoma of colon       Past Surgical History:  Procedure Laterality Date  . CESAREAN SECTION    . CESAREAN SECTION    . HERNIA REPAIR    . Left partial oophorectomy    . OOPHORECTOMY     1/2 ovary removed  . VAGINA SURGERY     mesh      Social History:      Social History   Tobacco Use  . Smoking status: Former Smoker    Packs/day: 0.25    Years: 30.00    Pack years: 7.50    Types: Cigarettes    Quit date: 12/28/2016    Years since quitting: 2.4  . Smokeless tobacco: Never Used  Substance Use Topics  . Alcohol use: Yes    Alcohol/week: 0.0 standard drinks    Comment: socailly       Family History :     Family History  Problem Relation Age of Onset  . Colon  cancer Mother 15  . Breast cancer Mother 25  . Diabetes Father   . Hypertension Father   . Kidney disease Father   . Colon cancer Father 68  . Prostate cancer Father   . Cancer Father   . Kidney disease Sister   . Cancer Brother   . Breast cancer Maternal Grandmother 103       Home Medications:   Prior to Admission medications   Medication Sig Start Date End Date Taking? Authorizing Provider  albuterol (VENTOLIN HFA) 108 (90 Base) MCG/ACT inhaler Inhale 2 puffs into the lungs every 4 (four) hours as needed for wheezing or shortness of breath. 04/16/19   Aldine Contes, MD  aspirin EC 81 MG tablet Take 1 tablet (81 mg total) by mouth daily. 01/15/19   [name], MD  EPINEPHrine 0.3 mg/0.3 mL IJ SOAJ injection Inject 0.3 mLs (0.3 mg total) into the muscle as needed for anaphylaxis. 04/27/19   Quintella Reichert, MD  fluticasone [facility]) 50 MCG/ACT nasal spray Place 1 spray into both nostrils daily. 04/26/19 04/25/20  Forde Dandy, PharmD  ipratropium-albuterol (DUONEB) 0.5-2.5 (3) MG/3ML SOLN Take 3 mLs by nebulization every 6 (six) hours as needed (wheezing or shortness of breath). 06/08/19   Molpus, John, MD  lidocaine (LIDODERM) 5 % Place 1 patch onto the skin daily. Remove & Discard patch within 12 hours or as directed by MD 05/22/19   Neva Seat, MD  loratadine (CLARITIN) 10 MG tablet Take 1 tablet (10 mg total) by mouth daily. 04/26/19   Forde Dandy, PharmD  mometasone-formoterol (DULERA) 200-5 MCG/ACT AERO Inhale 2 puffs into the lungs 2 (two) times daily. 04/26/19   Sid Falcon, MD  montelukast (SINGULAIR) 10 MG tablet Take 1 tablet (10 mg total) by mouth at bedtime. 04/26/19   Sid Falcon, MD  omeprazole (PRILOSEC) 20 MG capsule Take 1 capsule (20 mg total) by mouth daily. 12/28/18 12/28/19  Kathi Ludwig, MD  ondansetron (ZOFRAN) 4 MG tablet Take 1 tablet (4 mg total) by mouth every 8 (eight) hours as needed for nausea or vomiting. 02/27/19   Renita Papa, PA-C  oxyCODONE-acetaminophen (PERCOCET/ROXICET) 5-325 MG tablet Take 1 tablet by mouth every 6 (six) hours as needed for severe pain.    [provider]  predniSONE (DELTASONE) 20 MG tablet Take 3 tablets (60 mg total) by mouth daily. 3 tabs po day one, then 2 po daily x 4 days 06/08/19   Molpus, John, MD  sertraline (ZOLOFT) 100 MG tablet Take 100 mg by mouth 2 (two) times a day.    [provider]  Tiotropium Bromide Monohydrate (SPIRIVA RESPIMAT) 2.5 MCG/ACT AERS Inhale 2 puffs into the lungs daily. 05/08/19   [name], MD     Allergies:     Allergies  Allergen Reactions  . Topiramate Other (See Comments)    Reaction:  Hallucinations   . Tramadol Nausea Only     Physical Exam:   Vitals  Blood pressure 112/71, pulse 79, temperature 98.2 F (36.8 C), temperature source Oral, resp. rate 20, height '5\' 2"'  (1.575 m), weight 65.8 kg, last menstrual period 11/08/2014, SpO2 95 %.  1.  General: axox3  2. Psychiatric: Euthymic   3. Neurologic: cn2-12 intact, reflexes 2+ symmetric, diffuse with no clonus, motor 5/5 in all 4 ext  4. HEENMT:  Anicteric, pupils 1.21m symmetric, direct, consensual, near intact Neck: no jvd  5. Respiratory : + bilateral exp wheezing, no crackles.   6. Cardiovascular : rrr s1, s2  7. Gastrointestinal:  Abd: soft, nt, nd, +bs   8. Skin:  Ext: no c/c/e, no rash  9.Musculoskeletal:  Good ROM  No adenopathy    Data Review:    CBC No results for input(s): WBC, HGB, HCT, PLT, MCV, MCH, MCHC, RDW, LYMPHSABS, MONOABS, EOSABS, BASOSABS, BANDABS in the last 168 hours.  Invalid input(s): NEUTRABS, BANDSABD ------------------------------------------------------------------------------------------------------------------  No results found for this or any previous visit (from the past 468hour(s)).  Chemistries  No results for input(s): NA, K, CL, CO2, GLUCOSE, BUN, CREATININE, CALCIUM, MG, AST, ALT, ALKPHOS, BILITOT in the  last 168 hours.  Invalid input(s): GFRCGP ------------------------------------------------------------------------------------------------------------------  ------------------------------------------------------------------------------------------------------------------ GFR: CrCl cannot be calculated (Patient's most recent lab result is older than the maximum 21 days allowed.). Liver Function Tests: No results for input(s): AST, ALT, ALKPHOS, BILITOT, PROT, ALBUMIN in the last 168 hours. No results for input(s): LIPASE, AMYLASE in the last 168 hours. No results for input(s): AMMONIA in the last 168 hours. Coagulation Profile: No results for input(s): INR, PROTIME in the last 168 hours. Cardiac Enzymes: No results for input(s): CKTOTAL, CKMB, CKMBINDEX, TROPONINI in the last 168 hours. BNP (last 3 results) No results for input(s): PROBNP in the last 8760 hours. HbA1C: No results for input(s): HGBA1C in the last 72 hours. CBG: No results for input(s): GLUCAP in the last 168 hours. Lipid Profile: No results for input(s): CHOL, HDL, LDLCALC, TRIG, CHOLHDL, LDLDIRECT in the last 72 hours. Thyroid Function Tests: No results for input(s): TSH, T4TOTAL, FREET4, T3FREE, THYROIDAB in the last 72 hours. Anemia Panel: No results for input(s): VITAMINB12, FOLATE, FERRITIN, TIBC, IRON, RETICCTPCT in the last 72 hours.  --------------------------------------------------------------------------------------------------------------- Urine analysis:    Component Value Date/Time   COLORURINE YELLOW 02/27/2019 1636   APPEARANCEUR CLEAR 02/27/2019 1636   LABSPEC 1.015 02/27/2019 1636   PHURINE 7.0 02/27/2019 1636   GLUCOSEU NEGATIVE 02/27/2019 1636   GLUCOSEU NEG mg/dL 04/09/2008 2037   HGBUR NEGATIVE 02/27/2019 1636   BILIRUBINUR NEGATIVE 02/27/2019 1636   BILIRUBINUR Negative 05/24/2014 1150   KETONESUR NEGATIVE 02/27/2019 1636   PROTEINUR NEGATIVE 02/27/2019 1636   UROBILINOGEN 0.2  03/11/2015 1323   NITRITE NEGATIVE 02/27/2019 1636   LEUKOCYTESUR NEGATIVE 02/27/2019 1636      Imaging Results:    Dg Chest 2 View  Result Date: 06/08/2019 CLINICAL DATA:  Shortness of breath EXAM: CHEST - 2 VIEW COMPARISON:  04/27/2019 FINDINGS: The heart size and mediastinal contours are within normal limits. Both lungs are clear. The visualized skeletal structures are unremarkable. IMPRESSION: No active cardiopulmonary disease. Electronically Signed   By: Ulyses Jarred M.D.   On: 06/08/2019 01:39    ekg nsr at 48, lad, no st-t changes c/w ischemia   Assessment & Plan:    Active Problems:   COPD exacerbation (HCC)  Copd exacerbation Solumedrol 53m iv q8h Zithromax 5052miv qday Spriva 1puff qday Albuterol neb q6h and q6h prn  Hayfever Cont Claritin 1013mo qday Cotn Singulair 56m69m qday  Gerd Cont PPI  Anxiety Cont Zoloft 100mg100mbid  Chronic back pain Cont Percocet  DVT Prophylaxis-   Lovenox - SCDs   AM Labs Ordered, also please review Full Orders  Family Communication: Admission, patients condition and plan of care including tests being ordered have been discussed with the patient who indicate understanding and agree with the plan and Code Status.  Code Status:  FULL CODE, attempted to contact son, but no response,  Left message, pt will be admitted for Copd exacerbation   Admission status: Observation/: Based on patients clinical presentation and evaluation of above clinical data, I have made determination that patient meets observation criteria at this time.  Time spent in minutes : 55   JamesJani Gravelon 06/08/2019 at 4:44 AM

## 2019-06-08 NOTE — ED Provider Notes (Addendum)
[facility] DEPT Provider Note: Georgena Spurling, MD, FACEP  CSN: [id number] MRN: [id number] ARRIVAL: 06/08/19 at [place]: [id number]   CHIEF COMPLAINT  Shortness of Breath and Cough   HISTORY OF PRESENT ILLNESS  06/08/19 2:46 AM Kathleen [name] is a 58 y.o. female with a history of COPD.  She ran out of her albuterol inhaler and nebulizer medication and has had shortness of breath for the past 2 days.  She had a subjective fever 2 days ago but none currently.  Her shortness of breath is been associated with wheezing and her symptoms have been moderate.  She denies pain apart from soreness of her chest from increased work of breathing.  She was given an albuterol neb treatment as well as a new inhaler with significant improvement.  She continues to have wheezing but is comfortable and her oxygen saturation is presently 98% on room air.  She is requesting a dose of steroids prior to discharge so she can get to work at 6 AM.   Past Medical History:  Diagnosis Date  . Asthma    2 sets of PFT's in 04/09 without sign variability. Last set with significant decrease in FEV1 with saline alone, suggesting Asthma but  recommended clinical corelation   . Asthma   . Bipolar 1 disorder [facility])    therapist is Caryl Asp and is followed by [employer]  . Blackout    negative work up including ESR, ANA, opthalmology referral, carotid dopplers, 2D echo, MRI and EEG.  . BREAST LUMP 03/25/2008   Annotation: bilaterally Qualifier: Diagnosis of  By: Tomasa Hosteller MD, Edmon Crape.   . 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 Bronchitis   . Chronic headache   . Chronic low back pain 08/08/2008   Qualifier: Diagnosis of  By: Redmond Pulling  MD, Mateo Flow    . Chronic pain    normal work up including TSH, RPR, B12, HIV, plain films, 2 ESR's, ANA, CK, RF along with routine CBC, CMET and UA. Further work up includes CRP, ESR, SPEP/UPEP, hepatitis erology, A1C , repeat ANA  .  COPD (chronic obstructive pulmonary disease) ([place])   . COPD exacerbation ([place]) 03/31/2019  . DUB (dysfunctional uterine bleeding)    and pelvic pain, with negative endometrial bx in 07/09 and transvaginal U/S significant for mild fibroids in 0/09.  Marland Kitchen GERD (gastroesophageal reflux disease)   . Hallucinations 03/18/2008   Qualifier: Diagnosis of  By: Redmond Pulling  MD, Mateo Flow    . Lower extremity edema    Neg ABI's, normal 2D echo, normal albumin  . Menorrhagia   . Ovarian cyst   . Polysubstance abuse ([place])    none since March 17,2009.  Marland Kitchen Sleep apnea    NO CPAP  . Tubular adenoma of colon     Past Surgical History:  Procedure Laterality Date  . CESAREAN SECTION    . CESAREAN SECTION    . HERNIA REPAIR    . Left partial oophorectomy    . OOPHORECTOMY     1/2 ovary removed  . VAGINA SURGERY     mesh    Family History  Problem Relation Age of Onset  . Colon cancer Mother 86  . Breast cancer Mother 47  . Diabetes Father   . Hypertension Father   . Kidney disease Father   . Colon cancer Father 39  . Prostate cancer Father   . Cancer Father   . Kidney  disease Sister   . Cancer Brother   . Breast cancer Maternal Grandmother 37    Social History   Tobacco Use  . Smoking status: Former Smoker    Packs/day: 0.25    Years: 30.00    Pack years: 7.50    Types: Cigarettes    Quit date: 12/28/2016    Years since quitting: 2.4  . Smokeless tobacco: Never Used  Substance Use Topics  . Alcohol use: Yes    Alcohol/week: 0.0 standard drinks    Comment: socailly  . Drug use: No    Types: Marijuana    Comment: former    Prior to Admission medications   Medication Sig Start Date End Date Taking? Authorizing Provider  albuterol (VENTOLIN HFA) 108 (90 Base) MCG/ACT inhaler Inhale 2 puffs into the lungs every 4 (four) hours as needed for wheezing or shortness of breath. 04/16/19   Aldine Contes, MD  aspirin EC 81 MG tablet Take 1 tablet (81 mg total) by mouth daily. 01/15/19   [name], MD  azithromycin (ZITHROMAX) 250 MG tablet Take one tablet per day 04/01/19   Molli Hazard A, DO  azithromycin (ZITHROMAX) 250 MG tablet Take one tablet daily for four days 04/01/19   Seawell, Jaimie A, DO  EPINEPHrine 0.3 mg/0.3 mL IJ SOAJ injection Inject 0.3 mLs (0.3 mg total) into the muscle as needed for anaphylaxis. 04/27/19   Quintella Reichert, MD  fluticasone [facility]) 50 MCG/ACT nasal spray Place 1 spray into both nostrils daily. 04/26/19 04/25/20  Forde Dandy, PharmD  ipratropium-albuterol (DUONEB) 0.5-2.5 (3) MG/3ML SOLN Take 3 mLs by nebulization every 6 (six) hours as needed. 04/16/19   Aldine Contes, MD  lidocaine (LIDODERM) 5 % Place 1 patch onto the skin daily. Remove & Discard patch within 12 hours or as directed by MD 05/22/19   Neva Seat, MD  loratadine (CLARITIN) 10 MG tablet Take 1 tablet (10 mg total) by mouth daily. 04/26/19   Forde Dandy, PharmD  mometasone-formoterol (DULERA) 200-5 MCG/ACT AERO Inhale 2 puffs into the lungs 2 (two) times daily. 04/26/19   Sid Falcon, MD  montelukast (SINGULAIR) 10 MG tablet Take 1 tablet (10 mg total) by mouth at bedtime. 04/26/19   Sid Falcon, MD  omeprazole (PRILOSEC) 20 MG capsule Take 1 capsule (20 mg total) by mouth daily. 12/28/18 12/28/19  Kathi Ludwig, MD  ondansetron (ZOFRAN ODT) 8 MG disintegrating tablet Take 1 tablet (8 mg total) by mouth every 8 (eight) hours as needed for nausea or vomiting. Patient not taking: Reported on 02/27/2019 01/07/19   [name], MD  ondansetron (ZOFRAN) 4 MG tablet Take 1 tablet (4 mg total) by mouth every 8 (eight) hours as needed for nausea or vomiting. 02/27/19   Fawze, Mina A, PA-C  oxyCODONE-acetaminophen (PERCOCET/ROXICET) 5-325 MG tablet Take 1 tablet by mouth every 6 (six) hours as needed for severe pain.    [provider]  sertraline (ZOLOFT) 100 MG tablet Take 100 mg by mouth 2 (two) times a day.    [provider]  Tiotropium Bromide  Monohydrate (SPIRIVA RESPIMAT) 2.5 MCG/ACT AERS Inhale 2 puffs into the lungs daily. 05/08/19   [name], MD    Allergies Topiramate and Tramadol   REVIEW OF SYSTEMS  Negative except as noted here or in the History of Present Illness.   PHYSICAL EXAMINATION  Initial Vital Signs Blood pressure 122/81, pulse 80, temperature 98.2 F (36.8 C), temperature source Oral, resp. rate (!) 36, height 5'  2" (1.575 m), weight 65.8 kg, last menstrual period 11/08/2014, SpO2 98 %.  Examination General: Well-developed, well-nourished female in no acute distress; appearance consistent with age of record HENT: normocephalic; atraumatic Eyes: Normal appearance Neck: supple Heart: regular rate and rhythm Lungs: Inspiratory and expiratory wheezes with good air movement; no tachypnea Abdomen: soft; nondistended; nontender; bowel sounds present Extremities: No deformity; full range of motion Neurologic: Awake, alert and oriented; motor function intact in all extremities and symmetric; no facial droop Skin: Warm and dry Psychiatric: Normal mood and affect   RESULTS  Summary of this visit's results, reviewed by myself:   EKG Interpretation  Date/Time:  Friday June 08 2019 01:26:19 EDT Ventricular Rate:  86 PR Interval:    QRS Duration: 95 QT Interval:  397 QTC Calculation: 475 R Axis:   -60 Text Interpretation:  Sinus rhythm Abnormal R-wave progression, early transition No significant change was found Confirmed by Shanon Rosser [phone]) on 06/08/2019 1:30:53 AM      Laboratory Studies: No results found for this or any previous visit (from the past 24 hour(s)). Imaging Studies: Dg Chest 2 View  Result Date: 06/08/2019 CLINICAL DATA:  Shortness of breath EXAM: CHEST - 2 VIEW COMPARISON:  04/27/2019 FINDINGS: The heart size and mediastinal contours are within normal limits. Both lungs are clear. The visualized skeletal structures are unremarkable. IMPRESSION: No active cardiopulmonary  disease. Electronically Signed   By: Ulyses Jarred M.D.   On: 06/08/2019 01:39    ED COURSE and MDM  Nursing notes and initial vitals signs, including pulse oximetry, reviewed.  Vitals:   06/08/19 0209 06/08/19 0212 06/08/19 0216 06/08/19 0230  BP: (!) 130/49   122/81  Pulse: 97  94 80  Resp:      Temp:      TempSrc:      SpO2: 96%  97% 98%  Weight:  65.8 kg    Height:  '5\' 2"'  (1.575 m)     4:16 AM Patient had initially wanted to be discharged so she could go to work this morning.  Her dyspnea has worsened, however, and now believes she needs to be admitted.  She continues to have inspiratory and expiratory wheezes.  We will have her admitted.  PROCEDURES    ED DIAGNOSES     ICD-10-CM   1. COPD exacerbation ([place])  J44.1        Ayron Fillinger, MD 06/08/19 0301    Shanon Rosser, MD 06/08/19 [phone]

## 2019-06-08 NOTE — ED Notes (Signed)
ED TO INPATIENT HANDOFF REPORT  Name/Age/Gender Kathleen [name] 58 y.o. female  Code Status    Code Status Orders  (From admission, onward)         Start     Ordered   06/08/19 0427  Full code  Continuous     06/08/19 0429        Code Status History    Date Active Date Inactive Code Status Order ID Comments User Context   03/31/2019 1653 04/01/2019 1522 Full Code [id number]  Kathi Ludwig, MD Inpatient   05/13/2014 1846 05/16/2014 1549 Full Code [id number]  Charlynne Cousins, MD Inpatient   Advance Care Planning Activity      Home/SNF/Other Home  Chief Complaint Asthma;Shortness of Breath  Level of Care/Admitting Diagnosis ED Disposition    ED Disposition Condition Comment   Admit  Hospital Area: [place] [[id number]]  Level of Care: Med-Surg [16]  Covid Evaluation: Screening Protocol (No Symptoms)  Diagnosis: COPD exacerbation [facility]) [[id number]]  Admitting Physician: Jani Gravel [phone]  Attending Physician: Jani Gravel [[id number]]  PT Class (Do Not Modify): Observation [104]  PT Acc Code (Do Not Modify): Observation [10022]       Medical History Past Medical History:  Diagnosis Date  . Asthma    2 sets of PFT's in 04/09 without sign variability. Last set with significant decrease in FEV1 with saline alone, suggesting Asthma but  recommended clinical corelation   . Asthma   . Bipolar 1 disorder [facility])    therapist is Caryl Asp and is followed by [employer]  . Blackout    negative work up including ESR, ANA, opthalmology referral, carotid dopplers, 2D echo, MRI and EEG.  . BREAST LUMP 03/25/2008   Annotation: bilaterally Qualifier: Diagnosis of  By: Tomasa Hosteller MD, Edmon Crape.   . 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 Bronchitis   . Chronic headache   . Chronic low back pain 08/08/2008   Qualifier: Diagnosis of  By: Redmond Pulling  MD, Mateo Flow    . Chronic pain    normal work up including TSH, RPR, B12,  HIV, plain films, 2 ESR's, ANA, CK, RF along with routine CBC, CMET and UA. Further work up includes CRP, ESR, SPEP/UPEP, hepatitis erology, A1C , repeat ANA  . COPD (chronic obstructive pulmonary disease) ([place])   . COPD exacerbation ([place]) 03/31/2019  . DUB (dysfunctional uterine bleeding)    and pelvic pain, with negative endometrial bx in 07/09 and transvaginal U/S significant for mild fibroids in 0/09.  Marland Kitchen GERD (gastroesophageal reflux disease)   . Hallucinations 03/18/2008   Qualifier: Diagnosis of  By: Redmond Pulling  MD, Mateo Flow    . Lower extremity edema    Neg ABI's, normal 2D echo, normal albumin  . Menorrhagia   . Ovarian cyst   . Polysubstance abuse ([place])    none since March 17,2009.  Marland Kitchen Sleep apnea    NO CPAP  . Tubular adenoma of colon     Allergies Allergies  Allergen Reactions  . Topiramate Other (See Comments)    Reaction:  Hallucinations   . Tramadol Nausea Only    IV Location/Drains/Wounds Patient Lines/Drains/Airways Status   Active Line/Drains/Airways    Name:   Placement date:   Placement time:   Site:   Days:   Peripheral IV 06/08/19 Left Antecubital   06/08/19    0513    Antecubital   less than 1  Labs/Imaging Results for orders placed or performed during the hospital encounter of 06/08/19 (from the past 48 hour(s))  CBC with Differential/Platelet     Status: None   Collection Time: 06/08/19  5:17 AM  Result Value Ref Range   WBC 6.5 4.0 - 10.5 K/uL   RBC 4.40 3.87 - 5.11 MIL/uL   Hemoglobin 12.7 12.0 - 15.0 g/dL   HCT 40.1 36.0 - 46.0 %   MCV 91.1 80.0 - 100.0 fL   MCH 28.9 26.0 - 34.0 pg   MCHC 31.7 30.0 - 36.0 g/dL   RDW 13.0 11.5 - 15.5 %   Platelets 253 150 - 400 K/uL   nRBC 0.0 0.0 - 0.2 %   Neutrophils Relative % 57 %   Neutro Abs 3.8 1.7 - 7.7 K/uL   Lymphocytes Relative 28 %   Lymphs Abs 1.8 0.7 - 4.0 K/uL   Monocytes Relative 6 %   Monocytes Absolute 0.4 0.1 - 1.0 K/uL   Eosinophils Relative 8 %   Eosinophils Absolute 0.5 0.0 -  0.5 K/uL   Basophils Relative 1 %   Basophils Absolute 0.0 0.0 - 0.1 K/uL   Immature Granulocytes 0 %   Abs Immature Granulocytes 0.01 0.00 - 0.07 K/uL    Comment: Performed at [facility], [place] [address], [place], [place] [address]  Basic metabolic panel     Status: Abnormal   Collection Time: 06/08/19  5:17 AM  Result Value Ref Range   Sodium 142 135 - 145 mmol/L   Potassium 3.9 3.5 - 5.1 mmol/L   Chloride 110 98 - 111 mmol/L   CO2 25 22 - 32 mmol/L   Glucose, Bld 97 70 - 99 mg/dL   BUN 19 6 - 20 mg/dL   Creatinine, Ser 1.21 (H) 0.44 - 1.00 mg/dL   Calcium 8.6 (L) 8.9 - 10.3 mg/dL   GFR calc non Af Amer 49 (L) >60 mL/min   GFR calc Af Amer 57 (L) >60 mL/min   Anion gap 7 5 - 15    Comment: Performed at [facility], [place] [address] [place],  [address]  SARS Coronavirus 2 (CEPHEID - Performed in [place] hospital lab), Hosp Order     Status: None   Collection Time: 06/08/19  5:17 AM   Specimen: Nasopharyngeal Swab  Result Value Ref Range   SARS Coronavirus 2 NEGATIVE NEGATIVE    Comment: (NOTE) If result is NEGATIVE SARS-CoV-2 target nucleic acids are NOT DETECTED. The SARS-CoV-2 RNA is generally detectable in upper and lower  respiratory specimens during the acute phase of infection. The lowest  concentration of SARS-CoV-2 viral copies this assay can detect is 250  copies / mL. A negative result does not preclude SARS-CoV-2 infection  and should not be used as the sole basis for treatment or other  patient management decisions.  A negative result may occur with  improper specimen collection / handling, submission of specimen other  than nasopharyngeal swab, presence of viral mutation(s) within the  areas targeted by this assay, and inadequate number of viral copies  (<250 copies / mL). A negative result must be combined with clinical  observations, patient history, and epidemiological information. If result is  POSITIVE SARS-CoV-2 target nucleic acids are DETECTED. The SARS-CoV-2 RNA is generally detectable in upper and lower  respiratory specimens dur ing the acute phase of infection.  Positive  results are indicative of active infection with SARS-CoV-2.  Clinical  correlation with patient history  and other diagnostic information is  necessary to determine patient infection status.  Positive results do  not rule out bacterial infection or co-infection with other viruses. If result is PRESUMPTIVE POSTIVE SARS-CoV-2 nucleic acids MAY BE PRESENT.   A presumptive positive result was obtained on the submitted specimen  and confirmed on repeat testing.  While 2019 novel coronavirus  (SARS-CoV-2) nucleic acids may be present in the submitted sample  additional confirmatory testing may be necessary for epidemiological  and / or clinical management purposes  to differentiate between  SARS-CoV-2 and other Sarbecovirus currently known to infect humans.  If clinically indicated additional testing with an alternate test  methodology [phone]) is advised. The SARS-CoV-2 RNA is generally  detectable in upper and lower respiratory sp ecimens during the acute  phase of infection. The expected result is Negative. Fact Sheet for Patients:  StrictlyIdeas.no Fact Sheet for Healthcare Providers: BankingDealers.co.za This test is not yet approved or cleared by the [place] FDA and has been authorized for detection and/or diagnosis of SARS-CoV-2 by FDA under an Emergency Use Authorization (EUA).  This EUA will remain in effect (meaning this test can be used) for the duration of the COVID-19 declaration under Section 564(b)(1) of the Act, 21 U.S.C. section 360bbb-3(b)(1), unless the authorization is terminated or revoked sooner. Performed at [facility], [place] [address], [place], [place] [address]    Dg Chest 2 View  Result Date:  06/08/2019 CLINICAL DATA:  Shortness of breath EXAM: CHEST - 2 VIEW COMPARISON:  04/27/2019 FINDINGS: The heart size and mediastinal contours are within normal limits. Both lungs are clear. The visualized skeletal structures are unremarkable. IMPRESSION: No active cardiopulmonary disease. Electronically Signed   By: Ulyses Jarred M.D.   On: 06/08/2019 01:39    Pending Labs Unresulted Labs (From admission, onward)    Start     Ordered   06/15/19 0500  Creatinine, serum  (enoxaparin (LOVENOX)    CrCl >/= 30 ml/min)  Weekly,   R    Comments: while on enoxaparin therapy    06/08/19 0429   06/09/19 0500  Comprehensive metabolic panel  Tomorrow morning,   R     06/08/19 0429   06/09/19 0500  CBC  Tomorrow morning,   R     06/08/19 0429          Vitals/Pain Today's Vitals   06/08/19 0600 06/08/19 0630 06/08/19 0700 06/08/19 0758  BP: 118/79 111/81 121/78   Pulse: (!) 103 76 82   Resp:      Temp:      TempSrc:      SpO2: 97% 97% 100%   Weight:      Height:      PainSc:    0-No pain    Isolation Precautions No active isolations  Medications Medications  enoxaparin (LOVENOX) injection 40 mg (has no administration in time range)  sodium chloride flush (NS) 0.9 % injection 3 mL (has no administration in time range)  sodium chloride flush (NS) 0.9 % injection 3 mL (has no administration in time range)  0.9 %  sodium chloride infusion (has no administration in time range)  methylPREDNISolone sodium succinate (SOLU-MEDROL) 125 mg/2 mL injection 80 mg (has no administration in time range)  azithromycin (ZITHROMAX) 500 mg in sodium chloride 0.9 % 250 mL IVPB (500 mg Intravenous New Bag/Given 06/08/19 0723)  albuterol (VENTOLIN HFA) 108 (90 Base) MCG/ACT inhaler 2 puff (2 puffs Inhalation Given 06/08/19 0212)  albuterol (PROVENTIL) (2.5 MG/3ML)  0.083% nebulizer solution 5 mg (5 mg Nebulization Given 06/08/19 0115)  methylPREDNISolone sodium succinate (SOLU-MEDROL) 125 mg/2 mL injection 125  mg (125 mg Intramuscular Given 06/08/19 0326)  magnesium sulfate IVPB 2 g 50 mL (0 g Intravenous Stopped 06/08/19 0623)    Mobility walks

## 2019-06-08 NOTE — Progress Notes (Signed)
This is a brief summary of patient was made earlier today at approximately 5:00 this morning.  58 year old African-American female with a past medical history notable for bipolar disorder chronic pain, polysubstance abuse, GERD, obstructive sleep apnea and COPD and asthma who presented to the ER with shortness of breath.  Her presentation consistent with COPD exacerbation complicated by the fact the patient ran out of her inhaler.  In the ER she was seen by the hospitalist service and admitted for further eval and treatment.  Plan of care  COPD exacerbation continue steroids, nebs, antibiotics,  supplemental O2 as needed.  Intermittent Dysphagia.  Patient report difficulty swallowing intermittently there is no consistency to it.  Will obtain a CT of neck to rule out any underlying mass.  Will likely benefit from outpatient GI follow-up if negative  Tobacco dependence syndrome.  Patient received backslash counseling greater than 10 minutes.  Team patch placed.  Chronic back pain continue patient's home medications as well as Lidoderm patch.

## 2019-06-08 NOTE — ED Notes (Addendum)
The patient said her throat hurts and she is weak. She would like a work note. I asked if she was ready to leave and she said, "I don't know girl, I feel kinda shitty right now." Made Provider aware. Provider will reassess patient

## 2019-06-08 NOTE — ED Triage Notes (Signed)
Patient here from home with complaints of COPD flare. Reports that she ran out of her albuterol inhaler and nebulizer. Hx of same. Denies COVID exposure.

## 2019-06-08 NOTE — ED Notes (Signed)
Pt requested pain medication

## 2019-06-08 NOTE — ED Notes (Signed)
When I entered the pt's room with discharge paperwork, she was sitting on the end of the bed dry heaving and stating she could not breath. I reapplied her nasal canula at 2L. I communicated this information to the provider. The provider requested 4 additional puffs of inhaler.

## 2019-06-09 ENCOUNTER — Encounter (HOSPITAL_COMMUNITY): Payer: Self-pay | Admitting: *Deleted

## 2019-06-09 DIAGNOSIS — F411 Generalized anxiety disorder: Secondary | ICD-10-CM

## 2019-06-09 DIAGNOSIS — F329 Major depressive disorder, single episode, unspecified: Secondary | ICD-10-CM

## 2019-06-09 DIAGNOSIS — K219 Gastro-esophageal reflux disease without esophagitis: Secondary | ICD-10-CM

## 2019-06-09 LAB — COMPREHENSIVE METABOLIC PANEL
ALT: 23 U/L (ref 0–44)
AST: 21 U/L (ref 15–41)
Albumin: 3.3 g/dL — ABNORMAL LOW (ref 3.5–5.0)
Alkaline Phosphatase: 54 U/L (ref 38–126)
Anion gap: 9 (ref 5–15)
BUN: 16 mg/dL (ref 6–20)
CO2: 23 mmol/L (ref 22–32)
Calcium: 9 mg/dL (ref 8.9–10.3)
Chloride: 106 mmol/L (ref 98–111)
Creatinine, Ser: 0.75 mg/dL (ref 0.44–1.00)
GFR calc Af Amer: >60 mL/min (ref >60)
GFR calc non Af Amer: >60 mL/min (ref >60)
Glucose, Bld: 131 mg/dL — ABNORMAL HIGH (ref 70–99)
Potassium: 4.2 mmol/L (ref 3.5–5.1)
Sodium: 138 mmol/L (ref 135–145)
Total Bilirubin: 0.1 mg/dL — ABNORMAL LOW (ref 0.3–1.2)
Total Protein: 6.2 g/dL — ABNORMAL LOW (ref 6.5–8.1)

## 2019-06-09 LAB — CBC
HCT: 43 % (ref 36.0–46.0)
Hemoglobin: 13.7 g/dL (ref 12.0–15.0)
MCH: 28.6 pg (ref 26.0–34.0)
MCHC: 31.9 g/dL (ref 30.0–36.0)
MCV: 89.8 fL (ref 80.0–100.0)
Platelets: 283 10*3/uL (ref 150–400)
RBC: 4.79 MIL/uL (ref 3.87–5.11)
RDW: 12.8 % (ref 11.5–15.5)
WBC: 17.1 10*3/uL — ABNORMAL HIGH (ref 4.0–10.5)
nRBC: 0 % (ref 0.0–0.2)

## 2019-06-09 MED ORDER — IPRATROPIUM-ALBUTEROL 0.5-2.5 (3) MG/3ML IN SOLN: 3.0000 mL | Freq: Four times a day (QID) | RESPIRATORY_TRACT | 1 refills | Status: DC | PRN

## 2019-06-09 MED ORDER — AZITHROMYCIN 250 MG PO TABS: 500.0000 mg | ORAL_TABLET | Freq: Every day | ORAL | Status: DC

## 2019-06-09 MED ORDER — PREDNISONE 20 MG PO TABS: 60.0000 mg | ORAL_TABLET | Freq: Every day | ORAL | 0 refills | Status: DC

## 2019-06-09 NOTE — Progress Notes (Signed)
PHARMACIST - PHYSICIAN COMMUNICATION DR:   Wyonia Hough CONCERNING: Antibiotic IV to Oral Route Change Policy  RECOMMENDATION: This patient is receiving azithromycin by the intravenous route.  Based on criteria approved by the Pharmacy and Therapeutics Committee, the antibiotic(s) is/are being converted to the equivalent oral dose form(s).   DESCRIPTION: These criteria include:  Patient being treated for a respiratory tract infection, urinary tract infection, cellulitis or clostridium difficile associated diarrhea if on metronidazole  The patient is not neutropenic and does not exhibit a GI malabsorption state  The patient is eating (either orally or via tube) and/or has been taking other orally administered medications for a least 24 hours  The patient is improving clinically and has a Tmax < 100.5  If you have questions about this conversion, please contact the Pharmacy Department  []   [phone] )  [facility] []   [phone] )  [facility] []   [phone] )  [facility] []   [phone] )  [facility] [x]   [phone] )  [place]y, PharmD, BCPS 06/09/2019 7:32 AM

## 2019-06-09 NOTE — TOC Initial Note (Signed)
Transition of Care [facility]) - Initial/Assessment Note    Patient Details  Name: Kathleen [name] MRN: [id number] Date of Birth: [birth year]  Transition of Care (TOC) CM/SW Contact:    [name], RN Phone Number: 06/09/2019, 2:50 PM  Clinical Narrative:     Cm spoke with patient at bedside who reports she has difficulty affording her medications. Patient is active with [place] but does not qualify for the orange card program, however she intends to speak with [facility] pharmacy to see if any manufacturer assistance programs apply to her. She does not have any concerns at this time regarding getting her d/c scripts filled.                Expected Discharge Plan: Home/Self Care Barriers to Discharge: No Barriers Identified   Patient Goals and CMS Choice        Expected Discharge Plan and Services Expected Discharge Plan: Home/Self Care   Discharge Planning Services: CM Consult   Living arrangements for the past 2 months: Single Family Home Expected Discharge Date: 06/09/19               DME Arranged: N/A DME Agency: NA       HH Arranged: NA [place] Agency: NA        Prior Living Arrangements/Services Living arrangements for the past 2 months: Single Family Home Lives with:: Self Patient language and need for interpreter reviewed:: Yes Do you feel safe going back to the place where you live?: Yes      Need for Family Participation in Patient Care: Yes (Comment) Care giver support system in place?: Yes (comment)   Criminal Activity/Legal Involvement Pertinent to Current Situation/Hospitalization: No - Comment as needed  Activities of Daily Living Home Assistive Devices/Equipment: Nebulizer ADL Screening (condition at time of admission) Patient's cognitive ability adequate to safely complete daily activities?: Yes Is the patient deaf or have difficulty hearing?: No Does the patient have difficulty seeing, even when wearing glasses/contacts?: No Does the patient have  difficulty concentrating, remembering, or making decisions?: No Patient able to express need for assistance with ADLs?: Yes Does the patient have difficulty dressing or bathing?: No Independently performs ADLs?: Yes (appropriate for developmental age) Does the patient have difficulty walking or climbing stairs?: Yes(secondary to shortness of breath) Weakness of Legs: Both Weakness of Arms/Hands: None  Permission Sought/Granted                  Emotional Assessment Appearance:: Appears stated age Attitude/Demeanor/Rapport: Engaged Affect (typically observed): Accepting Orientation: : Oriented to Self, Oriented to Place, Oriented to  Time, Oriented to Situation   Psych Involvement: No (comment)  Admission diagnosis:  COPD exacerbation ([place]) [J44.1] Exacerbation of asthma, unspecified asthma severity, unspecified whether persistent [J45.901] Patient Active Problem List   Diagnosis Date Noted  . COPD exacerbation ([place]) 06/08/2019  . Daytime somnolence 04/06/2019  . Numbness and tingling in right hand 03/09/2019  . Lumbar radiculopathy 07/26/2016  . Depression 09/19/2015  . Hot flashes 02/05/2015  . Preventative health care 06/18/2014  . Asthma 05/13/2014  . 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 BIPOLAR DISORDER UNSPECIFIED 01/21/2010  . GERD 01/21/2010  . Sacral back pain 11/27/2008  . ARTHRITIS, GENERALIZED 10/08/2008  . Generalized anxiety disorder 03/14/2008  . DRUG ABUSE 03/14/2008  . Migraine variant 03/14/2008   PCP:  [name], MD Pharmacy:   [facility], [place], [place], [place]  [place] [address] Phone: [phone] Fax: [phone]  [facility], [place] [address] Phone: [phone] Fax: [phone]     Social Determinants of Health (SDOH) Interventions    Readmission Risk  Interventions No flowsheet data found.

## 2019-06-09 NOTE — Progress Notes (Signed)
Nurse reviewed discharge instructions with pt.  Pt verbalized understanding of discharge instructions and new medications.  Prescriptions given to pt prior to discharge.

## 2019-06-09 NOTE — Discharge Summary (Addendum)
Physician Discharge Summary  Kathleen [name] WSF:[id number] DOB: Sep 02, 1961 DOA: 06/08/2019  PCP: Kathleen Homes, MD  Admit date: 06/08/2019 Discharge date: 06/09/2019  Admitted From: Observation Disposition: home  Recommendations for Outpatient Follow-up:  1. Follow up with PCP in 1-2 weeks   Home Health:No Equipment/Devices:none  Discharge Condition:Stable CODE STATUS:Full code Diet recommendation: Regular healthy diet  Brief/Interim Summary:  Kathleen [name]  is a 58 y.o. female, w Bipolar do, chronic back pain, polysubstance abuse, Gerd, OSA, Copd/ Asthma,  Apparently presents with dyspnea, cough (dry), and wheezing. Pt states ran out of her albuterol inhaler.  Pt denies fever, chills, cp, palp, n/v, abd pain, diarrhea, brbpr, black stool.   In Ed,  T 98.2, P 79 R 36  Bp 112/71  Pox 95%  Wt 65.8 kg  CXR IMPRESSION: No active cardiopulmonary disease.  Hospital course: COPD exacerbation patient was admitted placed on steroids, Zithromax, Spiriva and albuterol nebulizer scheduled PRN.  Patient responded well was weaned off oxygen to room air.  She will be discharged home to complete a course of steroids, as well as continue nebs prescriptions given.  She will continue home medications for chronic back pain which include Percocet and Lidoderm patch 5%.  Will defer to her primary care physician for continued management and refills were outpatient pain medication regimen.  Should continue home medications for GERD as well as chronic seasonal allergies.  Patient did report continued smoking received tobacco cessation counseling greater than 10 minutes with encouragement for abstinence. Patient also reported intermittent dysphagia.  Was not having any during the hospital stay did obtain a CT of neck which ruled out any underlying mass none found.  Discussed with the patient importance of outpatient follow-up with GI for EGD for further evaluation.  Discharge Diagnoses:  Principal  Problem:   COPD exacerbation ([place]) Active Problems:   Generalized anxiety disorder   GERD   Depression    Discharge Instructions  Discharge Instructions    Call MD for:   Complete by: As directed    Return to local ER for any acute change in medical condition to include but not limited to nausea vomiting fevers chills chest pain or shortness of breath   Diet - low sodium heart healthy   Complete by: As directed    Increase activity slowly   Complete by: As directed      Allergies as of 06/09/2019      Reactions   Topiramate Other (See Comments)   Reaction:  Hallucinations    Tramadol Nausea Only      Medication List    STOP taking these medications   azithromycin 250 MG tablet Commonly known as: ZITHROMAX   ondansetron 8 MG disintegrating tablet Commonly known as: Zofran ODT     TAKE these medications   albuterol 108 (90 Base) MCG/ACT inhaler Commonly known as: VENTOLIN HFA Inhale 2 puffs into the lungs every 4 (four) hours as needed for wheezing or shortness of breath.   EPINEPHrine 0.3 mg/0.3 mL Soaj injection Commonly known as: EPI-PEN Inject 0.3 mLs (0.3 mg total) into the muscle as needed for anaphylaxis.   fluticasone 50 MCG/ACT nasal spray Commonly known as: Flonase Place 1 spray into both nostrils daily.   ipratropium-albuterol 0.5-2.5 (3) MG/3ML Soln Commonly known as: DUONEB Take 3 mLs by nebulization every 6 (six) hours as needed (wheezing or shortness of breath). What changed: reasons to take this   lidocaine 5 % Commonly known as: LIDODERM Place 1 patch onto the skin daily.  Remove & Discard patch within 12 hours or as directed by MD   loratadine 10 MG tablet Commonly known as: Claritin Take 1 tablet (10 mg total) by mouth daily.   mometasone-formoterol 200-5 MCG/ACT Aero Commonly known as: Dulera Inhale 2 puffs into the lungs 2 (two) times daily.   montelukast 10 MG tablet Commonly known as: SINGULAIR Take 1 tablet (10 mg total) by  mouth at bedtime.   omeprazole 20 MG capsule Commonly known as: PRILOSEC Take 1 capsule (20 mg total) by mouth daily.   oxyCODONE-acetaminophen 5-325 MG tablet Commonly known as: PERCOCET/ROXICET Take 1 tablet by mouth every 6 (six) hours as needed for severe pain.   predniSONE 20 MG tablet Commonly known as: DELTASONE Take 3 tablets (60 mg total) by mouth daily. 3 tabs po day one, then 2 po daily x 4 days   sertraline 100 MG tablet Commonly known as: ZOLOFT Take 100 mg by mouth 2 (two) times a day.   Tiotropium Bromide Monohydrate 2.5 MCG/ACT Aers Commonly known as: Spiriva Respimat Inhale 2 puffs into the lungs daily.       Allergies  Allergen Reactions  . Topiramate Other (See Comments)    Reaction:  Hallucinations   . Tramadol Nausea Only    Consultations:  None   Procedures/Studies: Dg Chest 2 View  Result Date: 06/08/2019 CLINICAL DATA:  Shortness of breath EXAM: CHEST - 2 VIEW COMPARISON:  04/27/2019 FINDINGS: The heart size and mediastinal contours are within normal limits. Both lungs are clear. The visualized skeletal structures are unremarkable. IMPRESSION: No active cardiopulmonary disease. Electronically Signed   By: Ulyses Jarred M.D.   On: 06/08/2019 01:39   Ct Soft Tissue Neck Wo Contrast  Result Date: 06/08/2019 CLINICAL DATA:  Dysphagia. Feels like a mass left neck when swallowing. Recent fever EXAM: CT NECK WITHOUT CONTRAST TECHNIQUE: Multidetector CT imaging of the neck was performed following the standard protocol without intravenous contrast. COMPARISON:  None. FINDINGS: Pharynx and larynx: Normal. No mass or swelling. Negative for laryngocele. Salivary glands: No inflammation, mass, or stone. Thyroid: Negative Lymph nodes: No enlarged lymph nodes in the neck. Vascular: Limited vascular evaluation without intravenous contrast. Limited intracranial: Negative Visualized orbits: Negative Mastoids and visualized paranasal sinuses: Mild mucosal edema  paranasal sinuses. Skeleton: Cervical spondylosis.  No acute skeletal abnormality. Upper chest: Lung apices clear bilaterally. Other: None IMPRESSION: No acute abnormality. No mass or adenopathy. No explanation for the patient's symptoms. Cervical spondylosis. Electronically Signed   By: Franchot Gallo M.D.   On: 06/08/2019 15:51       Subjective: No acute events overnight.  Patient requested discharge upon entering the room felt she was recovered and was doing well  Discharge Exam: Vitals:   06/09/19 0600 06/09/19 1045  BP:    Pulse:    Resp:    Temp:    SpO2: 97% 95%   Vitals:   06/09/19 0215 06/09/19 0551 06/09/19 0600 06/09/19 1045  BP:  121/83    Pulse:  76    Resp:  18    Temp:  98 F (36.7 C)    TempSrc:  Oral    SpO2: 94% 94% 97% 95%  Weight:      Height:        General: Pt is alert, awake, not in acute distress Cardiovascular: RRR, S1/S2 +, no rubs, no gallops Respiratory: Mild rhonchi bilaterally significant improvement from prior exam, trace wheezing. Abdominal: Soft, NT, ND, bowel sounds + Extremities: no edema, no cyanosis  The results of significant diagnostics from this hospitalization (including imaging, microbiology, ancillary and laboratory) are listed below for reference.     Microbiology: Recent Results (from the past 240 hour(s))  SARS Coronavirus 2 (CEPHEID - Performed in [place] hospital lab), Hosp Order     Status: None   Collection Time: 06/08/19  5:17 AM   Specimen: Nasopharyngeal Swab  Result Value Ref Range Status   SARS Coronavirus 2 NEGATIVE NEGATIVE Final    Comment: (NOTE) If result is NEGATIVE SARS-CoV-2 target nucleic acids are NOT DETECTED. The SARS-CoV-2 RNA is generally detectable in upper and lower  respiratory specimens during the acute phase of infection. The lowest  concentration of SARS-CoV-2 viral copies this assay can detect is 250  copies / mL. A negative result does not preclude SARS-CoV-2 infection  and  should not be used as the sole basis for treatment or other  patient management decisions.  A negative result may occur with  improper specimen collection / handling, submission of specimen other  than nasopharyngeal swab, presence of viral mutation(s) within the  areas targeted by this assay, and inadequate number of viral copies  (<250 copies / mL). A negative result must be combined with clinical  observations, patient history, and epidemiological information. If result is POSITIVE SARS-CoV-2 target nucleic acids are DETECTED. The SARS-CoV-2 RNA is generally detectable in upper and lower  respiratory specimens dur ing the acute phase of infection.  Positive  results are indicative of active infection with SARS-CoV-2.  Clinical  correlation with patient history and other diagnostic information is  necessary to determine patient infection status.  Positive results do  not rule out bacterial infection or co-infection with other viruses. If result is PRESUMPTIVE POSTIVE SARS-CoV-2 nucleic acids MAY BE PRESENT.   A presumptive positive result was obtained on the submitted specimen  and confirmed on repeat testing.  While 2019 novel coronavirus  (SARS-CoV-2) nucleic acids may be present in the submitted sample  additional confirmatory testing may be necessary for epidemiological  and / or clinical management purposes  to differentiate between  SARS-CoV-2 and other Sarbecovirus currently known to infect humans.  If clinically indicated additional testing with an alternate test  methodology [phone]) is advised. The SARS-CoV-2 RNA is generally  detectable in upper and lower respiratory sp ecimens during the acute  phase of infection. The expected result is Negative. Fact Sheet for Patients:  StrictlyIdeas.no Fact Sheet for Healthcare Providers: BankingDealers.co.za This test is not yet approved or cleared by the [place] FDA and has been  authorized for detection and/or diagnosis of SARS-CoV-2 by FDA under an Emergency Use Authorization (EUA).  This EUA will remain in effect (meaning this test can be used) for the duration of the COVID-19 declaration under Section 564(b)(1) of the Act, 21 U.S.C. section 360bbb-3(b)(1), unless the authorization is terminated or revoked sooner. Performed at [facility], [place] [address] [place],  [address]      Labs: BNP (last 3 results) Recent Labs    04/01/19 0732 04/27/19 1820  BNP 55.9 90.2   Basic Metabolic Panel: Recent Labs  Lab 06/08/19 0517 06/09/19 0318  NA 142 138  K 3.9 4.2  CL 110 106  CO2 25 23  GLUCOSE 97 131*  BUN 19 16  CREATININE 1.21* 0.75  CALCIUM 8.6* 9.0   Liver Function Tests: Recent Labs  Lab 06/09/19 0318  AST 21  ALT 23  ALKPHOS 54  BILITOT 0.1*  PROT 6.2*  ALBUMIN 3.3*  No results for input(s): LIPASE, AMYLASE in the last 168 hours. No results for input(s): AMMONIA in the last 168 hours. CBC: Recent Labs  Lab 06/08/19 0517 06/09/19 0318  WBC 6.5 17.1*  NEUTROABS 3.8  --   HGB 12.7 13.7  HCT 40.1 43.0  MCV 91.1 89.8  PLT 253 283   Cardiac Enzymes: No results for input(s): CKTOTAL, CKMB, CKMBINDEX, TROPONINI in the last 168 hours. BNP: Invalid input(s): POCBNP CBG: No results for input(s): GLUCAP in the last 168 hours. D-Dimer No results for input(s): DDIMER in the last 72 hours. Hgb A1c No results for input(s): HGBA1C in the last 72 hours. Lipid Profile No results for input(s): CHOL, HDL, LDLCALC, TRIG, CHOLHDL, LDLDIRECT in the last 72 hours. Thyroid function studies No results for input(s): TSH, T4TOTAL, T3FREE, THYROIDAB in the last 72 hours.  Invalid input(s): FREET3 Anemia work up No results for input(s): VITAMINB12, FOLATE, FERRITIN, TIBC, IRON, RETICCTPCT in the last 72 hours. Urinalysis    Component Value Date/Time   COLORURINE YELLOW 02/27/2019 1636   APPEARANCEUR CLEAR  02/27/2019 1636   LABSPEC 1.015 02/27/2019 1636   PHURINE 7.0 02/27/2019 1636   GLUCOSEU NEGATIVE 02/27/2019 1636   GLUCOSEU NEG mg/dL 04/09/2008 2037   HGBUR NEGATIVE 02/27/2019 1636   BILIRUBINUR NEGATIVE 02/27/2019 1636   BILIRUBINUR Negative 05/24/2014 1150   KETONESUR NEGATIVE 02/27/2019 1636   PROTEINUR NEGATIVE 02/27/2019 1636   UROBILINOGEN 0.2 03/11/2015 1323   NITRITE NEGATIVE 02/27/2019 1636   LEUKOCYTESUR NEGATIVE 02/27/2019 1636   Sepsis Labs Invalid input(s): PROCALCITONIN,  WBC,  LACTICIDVEN Microbiology Recent Results (from the past 240 hour(s))  SARS Coronavirus 2 (CEPHEID - Performed in [place] hospital lab), Hosp Order     Status: None   Collection Time: 06/08/19  5:17 AM   Specimen: Nasopharyngeal Swab  Result Value Ref Range Status   SARS Coronavirus 2 NEGATIVE NEGATIVE Final    Comment: (NOTE) If result is NEGATIVE SARS-CoV-2 target nucleic acids are NOT DETECTED. The SARS-CoV-2 RNA is generally detectable in upper and lower  respiratory specimens during the acute phase of infection. The lowest  concentration of SARS-CoV-2 viral copies this assay can detect is 250  copies / mL. A negative result does not preclude SARS-CoV-2 infection  and should not be used as the sole basis for treatment or other  patient management decisions.  A negative result may occur with  improper specimen collection / handling, submission of specimen other  than nasopharyngeal swab, presence of viral mutation(s) within the  areas targeted by this assay, and inadequate number of viral copies  (<250 copies / mL). A negative result must be combined with clinical  observations, patient history, and epidemiological information. If result is POSITIVE SARS-CoV-2 target nucleic acids are DETECTED. The SARS-CoV-2 RNA is generally detectable in upper and lower  respiratory specimens dur ing the acute phase of infection.  Positive  results are indicative of active infection with  SARS-CoV-2.  Clinical  correlation with patient history and other diagnostic information is  necessary to determine patient infection status.  Positive results do  not rule out bacterial infection or co-infection with other viruses. If result is PRESUMPTIVE POSTIVE SARS-CoV-2 nucleic acids MAY BE PRESENT.   A presumptive positive result was obtained on the submitted specimen  and confirmed on repeat testing.  While 2019 novel coronavirus  (SARS-CoV-2) nucleic acids may be present in the submitted sample  additional confirmatory testing may be necessary for epidemiological  and / or clinical management purposes  to differentiate between  SARS-CoV-2 and other Sarbecovirus currently known to infect humans.  If clinically indicated additional testing with an alternate test  methodology [phone]) is advised. The SARS-CoV-2 RNA is generally  detectable in upper and lower respiratory sp ecimens during the acute  phase of infection. The expected result is Negative. Fact Sheet for Patients:  StrictlyIdeas.no Fact Sheet for Healthcare Providers: BankingDealers.co.za This test is not yet approved or cleared by the [place] FDA and has been authorized for detection and/or diagnosis of SARS-CoV-2 by FDA under an Emergency Use Authorization (EUA).  This EUA will remain in effect (meaning this test can be used) for the duration of the COVID-19 declaration under Section 564(b)(1) of the Act, 21 U.S.C. section 360bbb-3(b)(1), unless the authorization is terminated or revoked sooner. Performed at [facility], [place] [address] [place], [place] [address]      Time coordinating discharge: Over 30 minutes  SIGNED:   Nicolette Bang, MD  [facility] 06/09/2019, 11:10 AM Pager   If 7PM-7AM, please contact night-coverage [email or web address] Password [email or web address]

## 2019-06-21 ENCOUNTER — Encounter: Payer: Self-pay | Admitting: Internal Medicine

## 2019-06-21 NOTE — Progress Notes (Deleted)
   CC: ***  HPI:  [name] is a 58 y.o. female with PMHx listed below presenting for ***. Please see the A&P for the status of the patient's chronic medical problems.  Past Medical History:  Diagnosis Date  . Asthma    2 sets of PFT's in 04/09 without sign variability. Last set with significant decrease in FEV1 with saline alone, suggesting Asthma but  recommended clinical corelation   . Asthma   . Bipolar 1 disorder [facility])    therapist is Caryl Asp and is followed by [employer]  . Blackout    negative work up including ESR, ANA, opthalmology referral, carotid dopplers, 2D echo, MRI and EEG.  . BREAST LUMP 03/25/2008   Annotation: bilaterally Qualifier: Diagnosis of  By: Tomasa Hosteller MD, Edmon Crape.   . 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 Bronchitis   . Chronic headache   . Chronic low back pain 08/08/2008   Qualifier: Diagnosis of  By: Redmond Pulling  MD, Mateo Flow    . Chronic pain    normal work up including TSH, RPR, B12, HIV, plain films, 2 ESR's, ANA, CK, RF along with routine CBC, CMET and UA. Further work up includes CRP, ESR, SPEP/UPEP, hepatitis erology, A1C , repeat ANA  . COPD (chronic obstructive pulmonary disease) ([place])   . COPD exacerbation ([place]) 03/31/2019  . DUB (dysfunctional uterine bleeding)    and pelvic pain, with negative endometrial bx in 07/09 and transvaginal U/S significant for mild fibroids in 0/09.  Marland Kitchen GERD (gastroesophageal reflux disease)   . Hallucinations 03/18/2008   Qualifier: Diagnosis of  By: Redmond Pulling  MD, Mateo Flow    . Lower extremity edema    Neg ABI's, normal 2D echo, normal albumin  . Menorrhagia   . Ovarian cyst   . Polysubstance abuse ([place])    none since March 17,2009.  Marland Kitchen Sleep apnea    NO CPAP  . Tubular adenoma of colon    Review of Systems:  Performed and all others negative.  Physical Exam: There were no vitals filed for this visit. General: Well nourished *** in no acute distress  HENT: Normocephalic, atraumatic, moist mucus membranes Pulm: Good air movement with no wheezing or crackles  CV: RRR, no murmurs, no rubs  Abdomen: Active bowel sounds, soft, non-distended, no tenderness to palpation  Extremities: Pulses palpable in all extremities, no LE edema  Skin: Warm and dry  Neuro: Alert and oriented x 3  Assessment & Plan:   See Encounters Tab for problem based charting.  Patient {GC/GE:[id number]::"discussed with","seen with"} Dr. {NAMES:[id number]::"Butcher","Granfortuna","E. Hoffman","Mullen","Narendra","Raines","Vincent"}

## 2019-06-27 NOTE — Addendum Note (Signed)
Addended by: Hulan Fray on: 06/27/2019 07:57 PM   Modules accepted: Orders

## 2019-07-11 ENCOUNTER — Inpatient Hospital Stay (HOSPITAL_COMMUNITY)
Admission: EM | Admit: 2019-07-11 | Discharge: 2019-07-15 | DRG: 190 | Disposition: A | Discharge status: Home / Self Care | Payer: Self-pay | Attending: Internal Medicine | Admitting: Internal Medicine

## 2019-07-11 ENCOUNTER — Emergency Department (HOSPITAL_COMMUNITY): Payer: Self-pay

## 2019-07-11 ENCOUNTER — Other Ambulatory Visit: Payer: Self-pay

## 2019-07-11 ENCOUNTER — Encounter (HOSPITAL_COMMUNITY): Payer: Self-pay | Admitting: Emergency Medicine

## 2019-07-11 DIAGNOSIS — G8929 Other chronic pain: Secondary | ICD-10-CM | POA: Diagnosis present

## 2019-07-11 DIAGNOSIS — R1310 Dysphagia, unspecified: Secondary | ICD-10-CM

## 2019-07-11 DIAGNOSIS — J9600 Acute respiratory failure, unspecified whether with hypoxia or hypercapnia: Secondary | ICD-10-CM

## 2019-07-11 DIAGNOSIS — J9601 Acute respiratory failure with hypoxia: Secondary | ICD-10-CM | POA: Diagnosis present

## 2019-07-11 DIAGNOSIS — Z7982 Long term (current) use of aspirin: Secondary | ICD-10-CM

## 2019-07-11 DIAGNOSIS — F319 Bipolar disorder, unspecified: Secondary | ICD-10-CM | POA: Diagnosis present

## 2019-07-11 DIAGNOSIS — K224 Dyskinesia of esophagus: Secondary | ICD-10-CM | POA: Diagnosis present

## 2019-07-11 DIAGNOSIS — R221 Localized swelling, mass and lump, neck: Secondary | ICD-10-CM

## 2019-07-11 DIAGNOSIS — M533 Sacrococcygeal disorders, not elsewhere classified: Secondary | ICD-10-CM | POA: Diagnosis present

## 2019-07-11 DIAGNOSIS — J45901 Unspecified asthma with (acute) exacerbation: Secondary | ICD-10-CM | POA: Diagnosis present

## 2019-07-11 DIAGNOSIS — Z79891 Long term (current) use of opiate analgesic: Secondary | ICD-10-CM

## 2019-07-11 DIAGNOSIS — M545 Low back pain: Secondary | ICD-10-CM | POA: Diagnosis present

## 2019-07-11 DIAGNOSIS — Z79899 Other long term (current) drug therapy: Secondary | ICD-10-CM

## 2019-07-11 DIAGNOSIS — F17200 Nicotine dependence, unspecified, uncomplicated: Secondary | ICD-10-CM | POA: Diagnosis present

## 2019-07-11 DIAGNOSIS — K219 Gastro-esophageal reflux disease without esophagitis: Secondary | ICD-10-CM | POA: Diagnosis present

## 2019-07-11 DIAGNOSIS — G43809 Other migraine, not intractable, without status migrainosus: Secondary | ICD-10-CM | POA: Diagnosis present

## 2019-07-11 DIAGNOSIS — Z888 Allergy status to other drugs, medicaments and biological substances status: Secondary | ICD-10-CM

## 2019-07-11 DIAGNOSIS — J441 Chronic obstructive pulmonary disease with (acute) exacerbation: Principal | ICD-10-CM | POA: Diagnosis present

## 2019-07-11 DIAGNOSIS — Z20828 Contact with and (suspected) exposure to other viral communicable diseases: Secondary | ICD-10-CM | POA: Diagnosis present

## 2019-07-11 DIAGNOSIS — G43909 Migraine, unspecified, not intractable, without status migrainosus: Secondary | ICD-10-CM | POA: Diagnosis present

## 2019-07-11 DIAGNOSIS — J449 Chronic obstructive pulmonary disease, unspecified: Secondary | ICD-10-CM | POA: Diagnosis present

## 2019-07-11 DIAGNOSIS — G4730 Sleep apnea, unspecified: Secondary | ICD-10-CM | POA: Diagnosis present

## 2019-07-11 LAB — BASIC METABOLIC PANEL
Anion gap: 8 (ref 5–15)
BUN: 17 mg/dL (ref 6–20)
CO2: 26 mmol/L (ref 22–32)
Calcium: 9 mg/dL (ref 8.9–10.3)
Chloride: 108 mmol/L (ref 98–111)
Creatinine, Ser: 0.93 mg/dL (ref 0.44–1.00)
GFR calc Af Amer: >60 mL/min (ref >60)
GFR calc non Af Amer: >60 mL/min (ref >60)
Glucose, Bld: 103 mg/dL — ABNORMAL HIGH (ref 70–99)
Potassium: 3.7 mmol/L (ref 3.5–5.1)
Sodium: 142 mmol/L (ref 135–145)

## 2019-07-11 LAB — CBC WITH DIFFERENTIAL/PLATELET
Abs Immature Granulocytes: 0.02 10*3/uL (ref 0.00–0.07)
Basophils Absolute: 0 10*3/uL (ref 0.0–0.1)
Basophils Relative: 1 %
Eosinophils Absolute: 0.3 10*3/uL (ref 0.0–0.5)
Eosinophils Relative: 4 %
HCT: 46.4 % — ABNORMAL HIGH (ref 36.0–46.0)
Hemoglobin: 14.1 g/dL (ref 12.0–15.0)
Immature Granulocytes: 0 %
Lymphocytes Relative: 29 %
Lymphs Abs: 2.1 10*3/uL (ref 0.7–4.0)
MCH: 27.8 pg (ref 26.0–34.0)
MCHC: 30.4 g/dL (ref 30.0–36.0)
MCV: 91.5 fL (ref 80.0–100.0)
Monocytes Absolute: 0.6 10*3/uL (ref 0.1–1.0)
Monocytes Relative: 8 %
Neutro Abs: 4.3 10*3/uL (ref 1.7–7.7)
Neutrophils Relative %: 58 %
Platelets: 274 10*3/uL (ref 150–400)
RBC: 5.07 MIL/uL (ref 3.87–5.11)
RDW: 13.6 % (ref 11.5–15.5)
WBC: 7.3 10*3/uL (ref 4.0–10.5)
nRBC: 0 % (ref 0.0–0.2)

## 2019-07-11 LAB — MRSA PCR SCREENING: MRSA by PCR: NEGATIVE

## 2019-07-11 LAB — SARS CORONAVIRUS 2 BY RT PCR (HOSPITAL ORDER, PERFORMED IN ~~LOC~~ HOSPITAL LAB): SARS Coronavirus 2: NEGATIVE

## 2019-07-11 MED ORDER — IPRATROPIUM-ALBUTEROL 0.5-2.5 (3) MG/3ML IN SOLN
3.0000 mL | Freq: Four times a day (QID) | RESPIRATORY_TRACT | Status: DC
  Administered 2019-07-11 (×2): 3 mL via RESPIRATORY_TRACT
  Filled 2019-07-11 (×2): qty 3

## 2019-07-11 MED ORDER — METHYLPREDNISOLONE SODIUM SUCC 125 MG IJ SOLR
60.0000 mg | Freq: Four times a day (QID) | INTRAMUSCULAR | Status: DC
  Administered 2019-07-11 – 2019-07-12 (×5): 60 mg via INTRAVENOUS
  Filled 2019-07-11 (×5): qty 2

## 2019-07-11 MED ORDER — ALBUTEROL SULFATE HFA 108 (90 BASE) MCG/ACT IN AERS
8.0000 | INHALATION_SPRAY | Freq: Once | RESPIRATORY_TRACT | Status: AC
  Administered 2019-07-11: 8 via RESPIRATORY_TRACT

## 2019-07-11 MED ORDER — PANTOPRAZOLE SODIUM 40 MG PO TBEC
40.0000 mg | DELAYED_RELEASE_TABLET | Freq: Two times a day (BID) | ORAL | Status: DC
  Administered 2019-07-11 – 2019-07-15 (×9): 40 mg via ORAL
  Filled 2019-07-11 (×9): qty 1

## 2019-07-11 MED ORDER — LIDOCAINE 5 % EX PTCH
1.0000 | MEDICATED_PATCH | CUTANEOUS | Status: DC
  Administered 2019-07-11 – 2019-07-15 (×5): 1 via TRANSDERMAL
  Filled 2019-07-11 (×5): qty 1

## 2019-07-11 MED ORDER — ASPIRIN EC 81 MG PO TBEC
81.0000 mg | DELAYED_RELEASE_TABLET | Freq: Every day | ORAL | Status: DC
  Administered 2019-07-11 – 2019-07-15 (×5): 81 mg via ORAL
  Filled 2019-07-11 (×5): qty 1

## 2019-07-11 MED ORDER — SODIUM CHLORIDE 0.9% FLUSH
3.0000 mL | Freq: Two times a day (BID) | INTRAVENOUS | Status: DC
  Administered 2019-07-11 – 2019-07-15 (×8): 3 mL via INTRAVENOUS

## 2019-07-11 MED ORDER — METHYLPREDNISOLONE SODIUM SUCC 125 MG IJ SOLR
125.0000 mg | Freq: Once | INTRAMUSCULAR | Status: AC
  Administered 2019-07-11: 125 mg via INTRAVENOUS
  Filled 2019-07-11: qty 2

## 2019-07-11 MED ORDER — ENOXAPARIN SODIUM 40 MG/0.4ML ~~LOC~~ SOLN
40.0000 mg | SUBCUTANEOUS | Status: DC
  Administered 2019-07-11 – 2019-07-14 (×4): 40 mg via SUBCUTANEOUS
  Filled 2019-07-11 (×4): qty 0.4

## 2019-07-11 MED ORDER — OXYCODONE-ACETAMINOPHEN 5-325 MG PO TABS
1.0000 | ORAL_TABLET | Freq: Four times a day (QID) | ORAL | Status: DC | PRN
  Administered 2019-07-11 – 2019-07-15 (×14): 1 via ORAL
  Filled 2019-07-11 (×14): qty 1

## 2019-07-11 MED ORDER — SODIUM CHLORIDE 0.9% FLUSH: 3.0000 mL | INTRAVENOUS | Status: DC | PRN

## 2019-07-11 MED ORDER — LORATADINE 10 MG PO TABS
10.0000 mg | ORAL_TABLET | Freq: Every day | ORAL | Status: DC
  Administered 2019-07-11 – 2019-07-15 (×5): 10 mg via ORAL
  Filled 2019-07-11 (×5): qty 1

## 2019-07-11 MED ORDER — ONDANSETRON HCL 4 MG/2ML IJ SOLN
4.0000 mg | Freq: Four times a day (QID) | INTRAMUSCULAR | Status: DC | PRN
  Administered 2019-07-11 – 2019-07-13 (×4): 4 mg via INTRAVENOUS
  Filled 2019-07-11 (×4): qty 2

## 2019-07-11 MED ORDER — SODIUM CHLORIDE 0.9 % IV BOLUS
1000.0000 mL | Freq: Once | INTRAVENOUS | Status: AC
  Administered 2019-07-11: 1000 mL via INTRAVENOUS

## 2019-07-11 MED ORDER — MONTELUKAST SODIUM 10 MG PO TABS
10.0000 mg | ORAL_TABLET | Freq: Every day | ORAL | Status: DC
  Administered 2019-07-11 – 2019-07-14 (×4): 10 mg via ORAL
  Filled 2019-07-11 (×5): qty 1

## 2019-07-11 MED ORDER — ORAL CARE MOUTH RINSE
15.0000 mL | Freq: Two times a day (BID) | OROMUCOSAL | Status: DC
  Administered 2019-07-11 – 2019-07-15 (×7): 15 mL via OROMUCOSAL

## 2019-07-11 MED ORDER — IPRATROPIUM-ALBUTEROL 0.5-2.5 (3) MG/3ML IN SOLN
3.0000 mL | RESPIRATORY_TRACT | Status: DC
  Administered 2019-07-12 (×5): 3 mL via RESPIRATORY_TRACT
  Filled 2019-07-11 (×5): qty 3

## 2019-07-11 MED ORDER — IPRATROPIUM-ALBUTEROL 20-100 MCG/ACT IN AERS: 1.0000 | INHALATION_SPRAY | Freq: Four times a day (QID) | RESPIRATORY_TRACT | Status: DC

## 2019-07-11 MED ORDER — SERTRALINE HCL 100 MG PO TABS
100.0000 mg | ORAL_TABLET | Freq: Two times a day (BID) | ORAL | Status: DC
  Administered 2019-07-11 – 2019-07-15 (×8): 100 mg via ORAL
  Filled 2019-07-11 (×8): qty 1

## 2019-07-11 MED ORDER — ACETAMINOPHEN 650 MG RE SUPP: 650.0000 mg | Freq: Four times a day (QID) | RECTAL | Status: DC | PRN

## 2019-07-11 MED ORDER — MAGNESIUM SULFATE 2 GM/50ML IV SOLN
2.0000 g | Freq: Once | INTRAVENOUS | Status: AC
  Administered 2019-07-11: 2 g via INTRAVENOUS
  Filled 2019-07-11: qty 50

## 2019-07-11 MED ORDER — LIDOCAINE 5 % EX PTCH: 1.0000 | MEDICATED_PATCH | CUTANEOUS | Status: DC

## 2019-07-11 MED ORDER — SODIUM CHLORIDE 0.9 % IV SOLN
INTRAVENOUS | Status: AC
  Administered 2019-07-11 – 2019-07-12 (×3): via INTRAVENOUS

## 2019-07-11 MED ORDER — GUAIFENESIN ER 600 MG PO TB12
600.0000 mg | ORAL_TABLET | Freq: Two times a day (BID) | ORAL | Status: DC
  Administered 2019-07-11 – 2019-07-15 (×9): 600 mg via ORAL
  Filled 2019-07-11 (×9): qty 1

## 2019-07-11 MED ORDER — ALBUTEROL SULFATE HFA 108 (90 BASE) MCG/ACT IN AERS
8.0000 | INHALATION_SPRAY | Freq: Once | RESPIRATORY_TRACT | Status: AC
  Administered 2019-07-11: 8 via RESPIRATORY_TRACT
  Filled 2019-07-11: qty 6.7

## 2019-07-11 MED ORDER — SODIUM CHLORIDE 0.9 % IV SOLN: 250.0000 mL | INTRAVENOUS | Status: DC | PRN

## 2019-07-11 MED ORDER — ACETAMINOPHEN 325 MG PO TABS: 650.0000 mg | ORAL_TABLET | Freq: Four times a day (QID) | ORAL | Status: DC | PRN

## 2019-07-11 MED ORDER — ONDANSETRON HCL 4 MG/2ML IJ SOLN
4.0000 mg | Freq: Once | INTRAMUSCULAR | Status: AC
  Administered 2019-07-11: 4 mg via INTRAVENOUS
  Filled 2019-07-11: qty 2

## 2019-07-11 MED ORDER — IPRATROPIUM BROMIDE HFA 17 MCG/ACT IN AERS
2.0000 | INHALATION_SPRAY | Freq: Once | RESPIRATORY_TRACT | Status: AC
  Administered 2019-07-11: 2 via RESPIRATORY_TRACT
  Filled 2019-07-11: qty 12.9

## 2019-07-11 MED ORDER — MOMETASONE FURO-FORMOTEROL FUM 200-5 MCG/ACT IN AERO
2.0000 | INHALATION_SPRAY | Freq: Two times a day (BID) | RESPIRATORY_TRACT | Status: DC
  Administered 2019-07-11 – 2019-07-15 (×8): 2 via RESPIRATORY_TRACT
  Filled 2019-07-11: qty 8.8

## 2019-07-11 NOTE — H&P (Addendum)
° ° °History and Physical ° °Kathleen [name] MRN:[id number] DOB: 11/21/1961 DOA: 07/11/2019 ° °PCP: Helberg, Justin, MD °Patient coming from: Home  ° °I have personally briefly reviewed patient's old medical records in [facility] ° ° °Chief Complaint: Shortness of breath ° °HPI: Kathleen [name] is a 57 y.o. female with past medical history significant for asthma/COPD, current smoker, bipolar disorder, drug use who presents complaining of shortness of breath.  Patient reports worsening shortness of breath over the last 2 days prior to admission.  This is accompanied by nausea and dry cough.  She reports that at her workplace, some coworkers were diagnosed with COVID-19.  She was not in close contact with them, but she went to the same room.  She has been vomiting since the day prior to admission, reported mild abdominal soreness.  She is also complaining of an exacerbation of her back pain and spasm.  She denies chest pain.  She reports post nasal drip, and reflux symptoms.  ° ° °Evaluation in the ED: Sodium 142, potassium 3.7, glucose 103, BUN 17, creatinine 0.93, hemoglobin 14, white blood cell 7.3, platelets 274.  Chest x-ray: No edema or consolidation. °COVID 19 Pending.  °In the ED she received albuterol ipratropium inhale, IV Solu-Medrol, IV magnesium.. Patient appears to be less tachypneic.  He is currently on 4 L of oxygen. ° ° °Review of Systems: All systems reviewed and apart from history of presenting illness, are negative. ° °Past Medical History:  °Diagnosis Date  °• Asthma   ° 2 sets of PFT's in 04/09 without sign variability. Last set with significant decrease in FEV1 with saline alone, suggesting Asthma but  recommended clinical corelation   °• Asthma   °• Bipolar 1 disorder (HCC)   ° therapist is Joy and is followed by [facility]  °• Blackout   ° negative work up including ESR, ANA, opthalmology referral, carotid dopplers, 2D echo, MRI and EEG.  °• BREAST LUMP 03/25/2008  ° Annotation:  bilaterally Qualifier: Diagnosis of  By: Enriquez MD, Veronique D.   °• Breast mass in female   ° s/p mammogram, u/s and biopsy in 05/09 c/w fibroadenoma,  °• Bronchitis   °• Chronic headache   °• Chronic low back pain 08/08/2008  ° Qualifier: Diagnosis of  By: Wilson  MD, Valerie    °• Chronic pain   ° normal work up including TSH, RPR, B12, HIV, plain films, 2 ESR's, ANA, CK, RF along with routine CBC, CMET and UA. Further work up includes CRP, ESR, SPEP/UPEP, hepatitis erology, A1C , repeat ANA  °• COPD (chronic obstructive pulmonary disease) (HCC)   °• COPD exacerbation (HCC) 03/31/2019  °• DUB (dysfunctional uterine bleeding)   ° and pelvic pain, with negative endometrial bx in 07/09 and transvaginal U/S significant for mild fibroids in 0/09.  °• GERD (gastroesophageal reflux disease)   °• Hallucinations 03/18/2008  ° Qualifier: Diagnosis of  By: Wilson  MD, Valerie    °• Lower extremity edema   ° Neg ABI's, normal 2D echo, normal albumin  °• Menorrhagia   °• Ovarian cyst   °• Polysubstance abuse (HCC)   ° none since March 17,2009.  °• Sleep apnea   ° NO CPAP  °• Tubular adenoma of colon   ° °Past Surgical History:  °Procedure Laterality Date  °• CESAREAN SECTION    °• CESAREAN SECTION    °• HERNIA REPAIR    °• Left partial oophorectomy    °• OOPHORECTOMY    °   1/2 ovary removed  °• VAGINA SURGERY    ° mesh  ° °Social History:  reports that she quit smoking about 2 years ago. Her smoking use included cigarettes. She has a 7.50 pack-year smoking history. She has never used smokeless tobacco. She reports current alcohol use. She reports that she does not use drugs. ° ° °Allergies  °Allergen Reactions  °• Topiramate Other (See Comments)  °  Reaction:  Hallucinations   °• Tramadol Nausea Only  ° ° °Family History  °Problem Relation Age of Onset  °• Colon cancer Mother 85  °• Breast cancer Mother 84  °• Diabetes Father   °• Hypertension Father   °• Kidney disease Father   °• Colon cancer Father 79  °• Prostate cancer  Father   °• Cancer Father   °• Kidney disease Sister   °• Cancer Brother   °• Breast cancer Maternal Grandmother 103  ° ° °Prior to Admission medications   °Medication Sig Start Date End Date Taking? Authorizing Provider  °albuterol (VENTOLIN HFA) 108 (90 Base) MCG/ACT inhaler Inhale 2 puffs into the lungs every 4 (four) hours as needed for wheezing or shortness of breath. 04/16/19   Narendra, Nischal, MD  °EPINEPHrine 0.3 mg/0.3 mL IJ SOAJ injection Inject 0.3 mLs (0.3 mg total) into the muscle as needed for anaphylaxis. 04/27/19   Rees, Elizabeth, MD  °fluticasone (FLONASE) 50 MCG/ACT nasal spray Place 1 spray into both nostrils daily. 04/26/19 04/25/20  Kim, Jennifer J, PharmD  °ipratropium-albuterol (DUONEB) 0.5-2.5 (3) MG/3ML SOLN Take 3 mLs by nebulization every 6 (six) hours as needed (wheezing or shortness of breath). 06/09/19   Spongberg, Christopher N, MD  °lidocaine (LIDODERM) 5 % Place 1 patch onto the skin daily. Remove & Discard patch within 12 hours or as directed by MD 05/22/19   Melvin, Alexander, MD  °loratadine (CLARITIN) 10 MG tablet Take 1 tablet (10 mg total) by mouth daily. 04/26/19   Kim, Jennifer J, PharmD  °mometasone-formoterol (DULERA) 200-5 MCG/ACT AERO Inhale 2 puffs into the lungs 2 (two) times daily. 04/26/19   Mullen, Emily B, MD  °montelukast (SINGULAIR) 10 MG tablet Take 1 tablet (10 mg total) by mouth at bedtime. 04/26/19   Mullen, Emily B, MD  °omeprazole (PRILOSEC) 20 MG capsule Take 1 capsule (20 mg total) by mouth daily. 12/28/18 12/28/19  Harbrecht, Lawrence, MD  °oxyCODONE-acetaminophen (PERCOCET/ROXICET) 5-325 MG tablet Take 1 tablet by mouth every 6 (six) hours as needed for severe pain.    [provider]  °predniSONE (DELTASONE) 20 MG tablet Take 3 tablets (60 mg total) by mouth daily. 3 tabs po day one, then 2 po daily x 4 days 06/09/19   Spongberg, Christopher N, MD  °sertraline (ZOLOFT) 100 MG tablet Take 100 mg by mouth 2 (two) times a day.    [provider]    °Tiotropium Bromide Monohydrate (SPIRIVA RESPIMAT) 2.5 MCG/ACT AERS Inhale 2 puffs into the lungs daily. 05/08/19   Helberg, Justin, MD  ° °Physical Exam: °Vitals:  ° 07/11/19 0739 07/11/19 0809 07/11/19 0830 07/11/19 0900  °BP: (!) 138/96 (!) 123/109 122/87 132/81  °Pulse: (!) 112 95 86 92  °Resp: (!) 26 (!) 25 (!) 28 (!) 23  °Temp: 98.5 °F (36.9 °C)     °TempSrc: Oral     °SpO2: 98% 99% 100% 100%  ° ° °· General exam: Moderately built and nourished patient, lying comfortably supine on the gurney in mild distress. °· Head, eyes and ENT: Nontraumatic and normocephalic. Pupils   equally reacting to light and accommodation. Oral mucosa moist.  Neck: Supple. No JVD, carotid bruit or thyromegaly.  Lymphatics: No lymphadenopathy.  Respiratory system: Tachypneic, bilateral rhonchus and expiratory wheezing  Cardiovascular system: S1 and S2 heard, RRR. No JVD, murmurs, gallops, clicks or pedal edema.  Gastrointestinal system: Abdomen is nondistended, soft and nontender. Normal bowel sounds heard. No organomegaly or masses appreciated.  Central nervous system: Alert and oriented. No focal neurological deficits.  Extremities: Symmetric 5 x 5 power. Peripheral pulses symmetrically felt.   Skin: No rashes or acute findings.  Musculoskeletal system: Negative exam.  Psychiatry: Pleasant and cooperative.   Labs on Admission:  Basic Metabolic Panel: Recent Labs  Lab 07/11/19 0809  NA 142  K 3.7  CL 108  CO2 26  GLUCOSE 103*  BUN 17  CREATININE 0.93  CALCIUM 9.0   Liver Function Tests: No results for input(s): AST, ALT, ALKPHOS, BILITOT, PROT, ALBUMIN in the last 168 hours. No results for input(s): LIPASE, AMYLASE in the last 168 hours. No results for input(s): AMMONIA in the last 168 hours. CBC: Recent Labs  Lab 07/11/19 0809  WBC 7.3  NEUTROABS 4.3  HGB 14.1  HCT 46.4*  MCV 91.5  PLT 274   Cardiac Enzymes: No results for input(s): CKTOTAL, CKMB, CKMBINDEX, TROPONINI in the last  168 hours.  BNP (last 3 results) No results for input(s): PROBNP in the last 8760 hours. CBG: No results for input(s): GLUCAP in the last 168 hours.  Radiological Exams on Admission: Dg Chest Portable 1 View  Result Date: 07/11/2019 CLINICAL DATA:  Shortness of breath EXAM: PORTABLE CHEST 1 VIEW COMPARISON:  June 08, 2019 FINDINGS: Lungs are clear. Heart size and pulmonary vascularity are normal. No adenopathy. No pneumothorax. No bone lesions. IMPRESSION: No edema or consolidation. Electronically Signed   By: Lowella Grip III M.D.   On: 07/11/2019 08:31    EKG: Independently reviewed.  Sinus rhythm, left axis deviation.  Assessment/Plan Principal Problem:   COPD exacerbation (HCC) Active Problems:   BIPOLAR DISORDER UNSPECIFIED   GERD   1-Acute hypoxic respiratory failure: Secondary to COPD asthma exacerbation Patient present with shortness of breath, cough.  Current smoker. Admit to the stepdown unit.  If COVID 19  test comes back positive she will need to be admitted to the [facility] with IV Solu-Medrol 60 mg every 6 hours. -Continue with Combivent inhaler for now, until COVID test is negative. -Continue with Singulair, Dulera. Addendum;  COVID test came back negative.  Change inhale to nebulizer.  PRN BIPAP .  2-GERD PPI twice daily. Education provided to patient.  3-Current smoker; Counseling provided.  4-Bipolar Continue with Zoloft.   5-Nausea, vomiting;  Related to acute illness.  PRN Zofran.   6-Chronic Back pain;  Resume percocet, Lidoderm  Patch.   DVT Prophylaxis: Lovenox Code Status: Full code Family Communication: Care discussed with patient who is able to understand plan of care Disposition Plan: Admit under observation for treatment of COPD asthma exacerbation.. Call with test my change observation status.    Time spent: 75 minutes.   Elmarie Shiley MD [facility]   07/11/2019, 9:17 AM

## 2019-07-11 NOTE — ED Notes (Signed)
Report given to [facility] for 2W, Room 1231. Designer, multimedia will come down and get patient to help ICU and ED RN's due to high volume, high acuity.

## 2019-07-11 NOTE — ED Provider Notes (Signed)
[facility] DEPT Provider Note   CSN: [id number] Arrival date & time: 07/11/19  0734    History   Chief Complaint Chief Complaint  Patient presents with  . Asthma  . Shortness of Breath    HPI Kathleen [name] is a 58 y.o. female.     The history is provided by the patient.  Asthma This is a recurrent problem. The current episode started yesterday. The problem occurs constantly. The problem has not changed since onset.Associated symptoms include shortness of breath. Pertinent negatives include no chest pain and no abdominal pain. The symptoms are aggravated by walking. Relieved by: inhaler  She has tried rest for the symptoms. The treatment provided mild relief.  Shortness of Breath Context: activity   Associated symptoms: wheezing   Associated symptoms: no abdominal pain, no chest pain, no cough, no ear pain, no fever, no rash, no sore throat and no vomiting     Past Medical History:  Diagnosis Date  . Asthma    2 sets of PFT's in 04/09 without sign variability. Last set with significant decrease in FEV1 with saline alone, suggesting Asthma but  recommended clinical corelation   . Asthma   . Bipolar 1 disorder [facility])    therapist is Caryl Asp and is followed by [facility]  . Blackout    negative work up including ESR, ANA, opthalmology referral, carotid dopplers, 2D echo, MRI and EEG.  . BREAST LUMP 03/25/2008   Annotation: bilaterally Qualifier: Diagnosis of  By: Tomasa Hosteller MD, Edmon Crape.   . 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 Bronchitis   . Chronic headache   . Chronic low back pain 08/08/2008   Qualifier: Diagnosis of  By: Redmond Pulling  MD, Mateo Flow    . Chronic pain    normal work up including TSH, RPR, B12, HIV, plain films, 2 ESR's, ANA, CK, RF along with routine CBC, CMET and UA. Further work up includes CRP, ESR, SPEP/UPEP, hepatitis erology, A1C , repeat ANA  . COPD (chronic obstructive  pulmonary disease) ([place])   . COPD exacerbation ([place]) 03/31/2019  . DUB (dysfunctional uterine bleeding)    and pelvic pain, with negative endometrial bx in 07/09 and transvaginal U/S significant for mild fibroids in 0/09.  Marland Kitchen GERD (gastroesophageal reflux disease)   . Hallucinations 03/18/2008   Qualifier: Diagnosis of  By: Redmond Pulling  MD, Mateo Flow    . Lower extremity edema    Neg ABI's, normal 2D echo, normal albumin  . Menorrhagia   . Ovarian cyst   . Polysubstance abuse ([place])    none since March 17,2009.  Marland Kitchen Sleep apnea    NO CPAP  . Tubular adenoma of colon     Patient Active Problem List   Diagnosis Date Noted  . COPD exacerbation ([place]) 06/08/2019  . Daytime somnolence 04/06/2019  . Numbness and tingling in right hand 03/09/2019  . Lumbar radiculopathy 07/26/2016  . Depression 09/19/2015  . Hot flashes 02/05/2015  . Preventative health care 06/18/2014  . Asthma 05/13/2014  . 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 BIPOLAR DISORDER UNSPECIFIED 01/21/2010  . GERD 01/21/2010  . Sacral back pain 11/27/2008  . ARTHRITIS, GENERALIZED 10/08/2008  . Generalized anxiety disorder 03/14/2008  . DRUG ABUSE 03/14/2008  . Migraine variant 03/14/2008    Past Surgical History:  Procedure Laterality Date  . CESAREAN SECTION    . CESAREAN SECTION    . HERNIA REPAIR    .  Left partial oophorectomy    . OOPHORECTOMY     1/2 ovary removed  . VAGINA SURGERY     mesh     OB History    Gravida  3   Para  2   Term  2   Preterm  0   AB  1   Living  2     SAB  0   TAB  1   Ectopic  0   Multiple      Live Births  2            Home Medications    Prior to Admission medications   Medication Sig Start Date End Date Taking? Authorizing Provider  albuterol (VENTOLIN HFA) 108 (90 Base) MCG/ACT inhaler Inhale 2 puffs into the lungs every 4 (four) hours as needed for wheezing or shortness of breath. 04/16/19  Yes Aldine Contes, MD  aspirin EC 81 MG tablet Take 81 mg by  mouth daily.   Yes [provider]  ibuprofen (ADVIL) 200 MG tablet Take 400 mg by mouth every 6 (six) hours as needed for fever, headache or moderate pain.   Yes [provider]  ipratropium-albuterol (DUONEB) 0.5-2.5 (3) MG/3ML SOLN Take 3 mLs by nebulization every 6 (six) hours as needed (wheezing or shortness of breath). 06/09/19  Yes Spongberg, Audie Pinto, MD  lidocaine (LIDODERM) 5 % Place 1 patch onto the skin daily. Remove & Discard patch within 12 hours or as directed by MD 05/22/19  Yes Neva Seat, MD  loratadine (CLARITIN) 10 MG tablet Take 1 tablet (10 mg total) by mouth daily. 04/26/19  Yes Forde Dandy, PharmD  mometasone-formoterol [facility]) 200-5 MCG/ACT AERO Inhale 2 puffs into the lungs 2 (two) times daily. 04/26/19  Yes Sid Falcon, MD  montelukast (SINGULAIR) 10 MG tablet Take 1 tablet (10 mg total) by mouth at bedtime. 04/26/19  Yes Sid Falcon, MD  omeprazole (PRILOSEC) 20 MG capsule Take 1 capsule (20 mg total) by mouth daily. 12/28/18 12/28/19 Yes Kathi Ludwig, MD  oxyCODONE-acetaminophen (PERCOCET/ROXICET) 5-325 MG tablet Take 1 tablet by mouth every 6 (six) hours as needed for severe pain.   Yes [provider]  sertraline (ZOLOFT) 100 MG tablet Take 100 mg by mouth 2 (two) times a day.   Yes [provider]  Tiotropium Bromide Monohydrate (SPIRIVA RESPIMAT) 2.5 MCG/ACT AERS Inhale 2 puffs into the lungs daily. 05/08/19  Yes Helberg, Larkin Ina, MD  EPINEPHrine 0.3 mg/0.3 mL IJ SOAJ injection Inject 0.3 mLs (0.3 mg total) into the muscle as needed for anaphylaxis. 04/27/19   Quintella Reichert, MD  fluticasone [facility]) 50 MCG/ACT nasal spray Place 1 spray into both nostrils daily. Patient not taking: Reported on 07/11/2019 04/26/19 04/25/20  Forde Dandy, PharmD  predniSONE (DELTASONE) 20 MG tablet Take 3 tablets (60 mg total) by mouth daily. 3 tabs po day one, then 2 po daily x 4 days Patient not taking: Reported on 07/11/2019  06/09/19   [name], MD    Family History Family History  Problem Relation Age of Onset  . Colon cancer Mother 19  . Breast cancer Mother 59  . Diabetes Father   . Hypertension Father   . Kidney disease Father   . Colon cancer Father 46  . Prostate cancer Father   . Cancer Father   . Kidney disease Sister   . Cancer Brother   . Breast cancer Maternal Grandmother 48    Social History Social History  Tobacco Use  . Smoking status: Former Smoker    Packs/day: 0.25    Years: 30.00    Pack years: 7.50    Types: Cigarettes    Quit date: 12/28/2016    Years since quitting: 2.5  . Smokeless tobacco: Never Used  Substance Use Topics  . Alcohol use: Yes    Alcohol/week: 0.0 standard drinks    Comment: socailly  . Drug use: No    Types: Marijuana    Comment: former     Allergies   Topiramate and Tramadol   Review of Systems Review of Systems  Constitutional: Negative for chills and fever.  HENT: Negative for ear pain and sore throat.   Eyes: Negative for pain and visual disturbance.  Respiratory: Positive for shortness of breath and wheezing. Negative for cough.   Cardiovascular: Negative for chest pain and palpitations.  Gastrointestinal: Negative for abdominal pain and vomiting.  Genitourinary: Negative for dysuria and hematuria.  Musculoskeletal: Negative for arthralgias and back pain.  Skin: Negative for color change and rash.  Neurological: Negative for seizures and syncope.  All other systems reviewed and are negative.    Physical Exam Updated Vital Signs ED Triage Vitals [07/11/19 0739]  Enc Vitals Group     BP (!) 138/96     Pulse Rate (!) 112     Resp (!) 26     Temp 98.5 F (36.9 C)     Temp Source Oral     SpO2 98 %     Weight      Height      Head Circumference      Peak Flow      Pain Score      Pain Loc      Pain Edu?      Excl. in [place]?     Physical Exam Vitals signs and nursing note reviewed.  Constitutional:       General: She is in acute distress.     Appearance: She is well-developed. She is not ill-appearing.  HENT:     Head: Normocephalic and atraumatic.  Eyes:     Extraocular Movements: Extraocular movements intact.     Conjunctiva/sclera: Conjunctivae normal.     Pupils: Pupils are equal, round, and reactive to light.  Neck:     Musculoskeletal: Normal range of motion and neck supple.  Cardiovascular:     Rate and Rhythm: Normal rate and regular rhythm.     Pulses: Normal pulses.     Heart sounds: Normal heart sounds. No murmur.  Pulmonary:     Effort: Tachypnea and accessory muscle usage present. No respiratory distress.     Breath sounds: Wheezing present.  Abdominal:     Palpations: Abdomen is soft.     Tenderness: There is no abdominal tenderness.  Musculoskeletal: Normal range of motion.     Right lower leg: No edema.     Left lower leg: No edema.  Skin:    General: Skin is warm and dry.     Capillary Refill: Capillary refill takes less than 2 seconds.  Neurological:     General: No focal deficit present.     Mental Status: She is alert.  Psychiatric:        Mood and Affect: Mood normal.      ED Treatments / Results  Labs (all labs ordered are listed, but only abnormal results are displayed) Labs Reviewed  CBC WITH DIFFERENTIAL/PLATELET - Abnormal; Notable for the following components:  Result Value   HCT 46.4 (*)    All other components within normal limits  BASIC METABOLIC PANEL - Abnormal; Notable for the following components:   Glucose, Bld 103 (*)    All other components within normal limits  SARS CORONAVIRUS 2 (HOSPITAL ORDER, [place] LAB)    EKG EKG Interpretation  Date/Time:  Wednesday July 11 2019 07:55:05 EDT Ventricular Rate:  93 PR Interval:    QRS Duration: 77 QT Interval:  383 QTC Calculation: 477 R Axis:   -33 Text Interpretation:  Sinus rhythm Left axis deviation Confirmed by Lennice Sites [phone]) on 07/11/2019  7:58:59 AM   Radiology Dg Chest Portable 1 View  Result Date: 07/11/2019 CLINICAL DATA:  Shortness of breath EXAM: PORTABLE CHEST 1 VIEW COMPARISON:  June 08, 2019 FINDINGS: Lungs are clear. Heart size and pulmonary vascularity are normal. No adenopathy. No pneumothorax. No bone lesions. IMPRESSION: No edema or consolidation. Electronically Signed   By: Lowella Grip III M.D.   On: 07/11/2019 08:31    Procedures .Critical Care Performed by: Lennice Sites, DO Authorized by: Lennice Sites, DO   Critical care provider statement:    Critical care time (minutes):  40   Critical care time was exclusive of:  Separately billable procedures and treating other patients and teaching time   Critical care was necessary to treat or prevent imminent or life-threatening deterioration of the following conditions:  Respiratory failure   Critical care was time spent personally by me on the following activities:  Blood draw for specimens, development of treatment plan with patient or surrogate, obtaining history from patient or surrogate, ordering and performing treatments and interventions, ordering and review of radiographic studies, ordering and review of laboratory studies, re-evaluation of patient's condition, pulse oximetry, review of old charts, evaluation of patient's response to treatment, examination of patient and discussions with primary provider   I assumed direction of critical care for this patient from another provider in my specialty: no     (including critical care time)  Medications Ordered in ED Medications  Ipratropium-Albuterol (COMBIVENT) respimat 1 puff (has no administration in time range)  methylPREDNISolone sodium succinate (SOLU-MEDROL) 125 mg/2 mL injection 60 mg (has no administration in time range)  mometasone-formoterol (DULERA) 200-5 MCG/ACT inhaler 2 puff (has no administration in time range)  montelukast (SINGULAIR) tablet 10 mg (has no administration in time range)   albuterol (VENTOLIN HFA) 108 (90 Base) MCG/ACT inhaler 8 puff (8 puffs Inhalation Given 07/11/19 0825)  ipratropium (ATROVENT HFA) inhaler 2 puff (2 puffs Inhalation Given 07/11/19 0825)  magnesium sulfate IVPB 2 g 50 mL (2 g Intravenous New Bag/Given 07/11/19 0825)  methylPREDNISolone sodium succinate (SOLU-MEDROL) 125 mg/2 mL injection 125 mg (125 mg Intravenous Given 07/11/19 0826)  sodium chloride 0.9 % bolus 1,000 mL (1,000 mLs Intravenous New Bag/Given 07/11/19 0824)  albuterol (VENTOLIN HFA) 108 (90 Base) MCG/ACT inhaler 8 puff (8 puffs Inhalation Given 07/11/19 0902)  ondansetron (ZOFRAN) injection 4 mg (4 mg Intravenous Given 07/11/19 0903)     Initial Impression / Assessment and Plan / ED Course  I have reviewed the triage vital signs and the nursing notes.  Pertinent labs & imaging results that were available during my care of the patient were reviewed by me and considered in my medical decision making (see chart for details).     Kathleen [name] is a 58 year old female with history of asthma who presents the ED with wheezing, shortness of breath, asthma exacerbation.  Patient  with overall unremarkable vitals except for mild tachycardia and increased work of breathing.  Patient states that she has been using her inhaler more than usual.  Allergy/weather changes likely cause per patient.  Has not been on steroids recently.  Continues to smoke.  Patient has wheezing on exam but she does have good air movement.  Placed on oxygen for comfort.  We will get basic labs, chest x-ray.  She states that she is negative for coronavirus about a month ago.  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  Will treat with albuterol inhaler, Atrovent, magnesium, Solu-Medrol.  Will reevaluate.  Patient with unremarkable lab work.  Chest x-ray with no signs of pneumonia.  Coronavirus test has been collected as patient needs to be admitted for further respiratory failure in the  setting of COPD exacerbation.  Patient still with increased work of breathing, wheezing despite breathing treatments.  Given additional albuterol treatments and to be admitted to hospital service for further care.  Feels comfortable on several liters of oxygen.  This chart was dictated using voice recognition software.  Despite best efforts to proofread,  errors can occur which can change the documentation meaning.    Final Clinical Impressions(s) / ED Diagnoses   Final diagnoses:  COPD exacerbation ([place])  Acute respiratory failure, unspecified whether with hypoxia or hypercapnia [facility])    ED Discharge Orders    None       Lennice Sites, DO 07/11/19 [phone]

## 2019-07-11 NOTE — ED Notes (Signed)
Patient has ambulated to restroom to provide urine specimen and has done so without complication or assistance. Patient still presents with dyspnea when ambulating.

## 2019-07-11 NOTE — ED Notes (Signed)
ICU RN is providing care for another patient and will call ED RN back ASAP when finished with patient care.

## 2019-07-11 NOTE — ED Triage Notes (Signed)
Pt reports she is out of her asthma medications and waiting for them to come in. Her breathing gotten worse since yesterday and not relieved by inhaler.

## 2019-07-11 NOTE — ED Notes (Signed)
Respiratory has been called and is in route to assist patient ASAP.

## 2019-07-12 ENCOUNTER — Observation Stay (HOSPITAL_COMMUNITY): Payer: Self-pay

## 2019-07-12 DIAGNOSIS — K219 Gastro-esophageal reflux disease without esophagitis: Secondary | ICD-10-CM

## 2019-07-12 DIAGNOSIS — F3160 Bipolar disorder, current episode mixed, unspecified: Secondary | ICD-10-CM

## 2019-07-12 LAB — CBC
HCT: 39.6 % (ref 36.0–46.0)
Hemoglobin: 12.6 g/dL (ref 12.0–15.0)
MCH: 29 pg (ref 26.0–34.0)
MCHC: 31.8 g/dL (ref 30.0–36.0)
MCV: 91 fL (ref 80.0–100.0)
Platelets: 265 10*3/uL (ref 150–400)
RBC: 4.35 MIL/uL (ref 3.87–5.11)
RDW: 13.2 % (ref 11.5–15.5)
WBC: 13.6 10*3/uL — ABNORMAL HIGH (ref 4.0–10.5)
nRBC: 0 % (ref 0.0–0.2)

## 2019-07-12 LAB — COMPREHENSIVE METABOLIC PANEL
ALT: 29 U/L (ref 0–44)
AST: 23 U/L (ref 15–41)
Albumin: 3.4 g/dL — ABNORMAL LOW (ref 3.5–5.0)
Alkaline Phosphatase: 69 U/L (ref 38–126)
Anion gap: 8 (ref 5–15)
BUN: 16 mg/dL (ref 6–20)
CO2: 25 mmol/L (ref 22–32)
Calcium: 8.8 mg/dL — ABNORMAL LOW (ref 8.9–10.3)
Chloride: 104 mmol/L (ref 98–111)
Creatinine, Ser: 0.77 mg/dL (ref 0.44–1.00)
GFR calc Af Amer: >60 mL/min (ref >60)
GFR calc non Af Amer: >60 mL/min (ref >60)
Glucose, Bld: 139 mg/dL — ABNORMAL HIGH (ref 70–99)
Potassium: 4.5 mmol/L (ref 3.5–5.1)
Sodium: 137 mmol/L (ref 135–145)
Total Bilirubin: 0.5 mg/dL (ref 0.3–1.2)
Total Protein: 6.2 g/dL — ABNORMAL LOW (ref 6.5–8.1)

## 2019-07-12 MED ORDER — IPRATROPIUM BROMIDE HFA 17 MCG/ACT IN AERS
2.0000 | INHALATION_SPRAY | Freq: Three times a day (TID) | RESPIRATORY_TRACT | Status: DC
  Filled 2019-07-12: qty 12.9

## 2019-07-12 MED ORDER — IPRATROPIUM-ALBUTEROL 0.5-2.5 (3) MG/3ML IN SOLN
3.0000 mL | Freq: Four times a day (QID) | RESPIRATORY_TRACT | Status: DC | PRN
  Administered 2019-07-13 (×2): 3 mL via RESPIRATORY_TRACT
  Filled 2019-07-12 (×2): qty 3

## 2019-07-12 MED ORDER — IPRATROPIUM-ALBUTEROL 0.5-2.5 (3) MG/3ML IN SOLN: 3.0000 mL | Freq: Three times a day (TID) | RESPIRATORY_TRACT | Status: DC

## 2019-07-12 MED ORDER — NICOTINE 14 MG/24HR TD PT24
14.0000 mg | MEDICATED_PATCH | Freq: Every day | TRANSDERMAL | Status: DC
  Administered 2019-07-12 – 2019-07-15 (×4): 14 mg via TRANSDERMAL
  Filled 2019-07-12 (×4): qty 1

## 2019-07-12 MED ORDER — METHYLPREDNISOLONE SODIUM SUCC 125 MG IJ SOLR
60.0000 mg | Freq: Three times a day (TID) | INTRAMUSCULAR | Status: DC
  Administered 2019-07-12 – 2019-07-13 (×2): 60 mg via INTRAVENOUS
  Filled 2019-07-12 (×2): qty 2

## 2019-07-12 MED ORDER — ALBUTEROL SULFATE HFA 108 (90 BASE) MCG/ACT IN AERS
2.0000 | INHALATION_SPRAY | RESPIRATORY_TRACT | Status: DC | PRN
  Administered 2019-07-12: 3 via RESPIRATORY_TRACT

## 2019-07-12 MED ORDER — IPRATROPIUM BROMIDE HFA 17 MCG/ACT IN AERS: 2.0000 | INHALATION_SPRAY | RESPIRATORY_TRACT | Status: DC

## 2019-07-12 MED ORDER — CHLORHEXIDINE GLUCONATE CLOTH 2 % EX PADS
6.0000 | MEDICATED_PAD | Freq: Every day | CUTANEOUS | Status: DC
  Administered 2019-07-12 – 2019-07-14 (×2): 6 via TOPICAL

## 2019-07-12 NOTE — Progress Notes (Signed)
PROGRESS NOTE    Kathleen [name]  ZOX:[id number] DOB: 1961-10-22 DOA: 07/11/2019 PCP: [name], MD    Brief Narrative:  58 y.o. female with past medical history significant for asthma/COPD, current smoker, bipolar disorder, drug use who presents complaining of shortness of breath.  Patient reports worsening shortness of breath over the last 2 days prior to admission.  This is accompanied by nausea and dry cough.  She reports that at her workplace, some coworkers were diagnosed with COVID-19.  She was not in close contact with them, but she went to the same room.  She has been vomiting since the day prior to admission, reported mild abdominal soreness.  She is also complaining of an exacerbation of her back pain and spasm.  She denies chest pain.  She reports post nasal drip, and reflux symptoms.    Evaluation in the ED: Sodium 142, potassium 3.7, glucose 103, BUN 17, creatinine 0.93, hemoglobin 14, white blood cell 7.3, platelets 274.  Chest x-ray: No edema or consolidation. COVID 19 Pending.  In the ED she received albuterol ipratropium inhale, IV Solu-Medrol, IV magnesium.. Patient appears to be less tachypneic.  He is currently on 4 L of oxygen.  Assessment & Plan:   Principal Problem:   COPD exacerbation ([place]) Active Problems:   BIPOLAR DISORDER UNSPECIFIED   GERD  1-Acute hypoxic respiratory failure: Secondary to COPD asthma exacerbation Patient present with shortness of breath, cough.  Current smoker. -COVID is neg -Still with marked end-expiratory wheezing and decreased BS throughout, presented with 4LNC -Will continue IV steroids, decrease to q8hr dosing -Continue with breathing tx as tolerated -Continue with Singulair, Dulera.  2-GERD -Continue on PPI twice daily. -Education provided to patient at time of presentation  3-Current smoker; -Cessation done -Have ordered nicotine patch  4-Bipolar -Continue with Zoloft as tolerated   5-Nausea, vomiting;   Related to acute illness.  -Seems improved this AM PRN Zofran.   6-Chronic Back pain;  Resume percocet, Lidoderm  Patch.    DVT prophylaxis: Lovenox subq Code Status: Full Family Communication: Pt in room, family not at bedside Disposition Plan: Home when wheezing improves  Consultants:     Procedures:     Antimicrobials: Anti-infectives (From admission, onward)   None       Subjective: Reports feeling somewhat better, still sob and wheezing  Objective: Vitals:   07/12/19 1207 07/12/19 1300 07/12/19 1400 07/12/19 1500  BP:      Pulse:  99 82 (!) 102  Resp:  (!) 21 17 (!) 31  Temp: 97.9 F (36.6 C)     TempSrc: Oral     SpO2:  96% 95% 97%  Weight:      Height:        Intake/Output Summary (Last 24 hours) at 07/12/2019 1508 Last data filed at 07/12/2019 1100 Gross per 24 hour  Intake 1781.25 ml  Output 950 ml  Net 831.25 ml   Filed Weights   07/11/19 1341  Weight: 69.9 kg    Examination:  General exam: Appears calm and comfortable  Respiratory system: increased resp effort, decreased BS, end-expiratory wheezing Cardiovascular system: S1 & S2 heard, RRR Gastrointestinal system: Abdomen is nondistended, soft and nontender. No organomegaly or masses felt. Normal bowel sounds heard. Central nervous system: Alert and oriented. No focal neurological deficits. Extremities: Symmetric 5 x 5 power. Skin: No rashes, lesions  Psychiatry: Judgement and insight appear normal. Mood & affect appropriate.   Data Reviewed: I have personally reviewed following labs and imaging  studies  CBC: Recent Labs  Lab 07/11/19 0809 07/12/19 0225  WBC 7.3 13.6*  NEUTROABS 4.3  --   HGB 14.1 12.6  HCT 46.4* 39.6  MCV 91.5 91.0  PLT 274 147   Basic Metabolic Panel: Recent Labs  Lab 07/11/19 0809 07/12/19 0225  NA 142 137  K 3.7 4.5  CL 108 104  CO2 26 25  GLUCOSE 103* 139*  BUN 17 16  CREATININE 0.93 0.77  CALCIUM 9.0 8.8*   GFR: Estimated Creatinine  Clearance: 70.2 mL/min (by C-G formula based on SCr of 0.77 mg/dL). Liver Function Tests: Recent Labs  Lab 07/12/19 0225  AST 23  ALT 29  ALKPHOS 69  BILITOT 0.5  PROT 6.2*  ALBUMIN 3.4*   No results for input(s): LIPASE, AMYLASE in the last 168 hours. No results for input(s): AMMONIA in the last 168 hours. Coagulation Profile: No results for input(s): INR, PROTIME in the last 168 hours. Cardiac Enzymes: No results for input(s): CKTOTAL, CKMB, CKMBINDEX, TROPONINI in the last 168 hours. BNP (last 3 results) No results for input(s): PROBNP in the last 8760 hours. HbA1C: No results for input(s): HGBA1C in the last 72 hours. CBG: No results for input(s): GLUCAP in the last 168 hours. Lipid Profile: No results for input(s): CHOL, HDL, LDLCALC, TRIG, CHOLHDL, LDLDIRECT in the last 72 hours. Thyroid Function Tests: No results for input(s): TSH, T4TOTAL, FREET4, T3FREE, THYROIDAB in the last 72 hours. Anemia Panel: No results for input(s): VITAMINB12, FOLATE, FERRITIN, TIBC, IRON, RETICCTPCT in the last 72 hours. Sepsis Labs: No results for input(s): PROCALCITON, LATICACIDVEN in the last 168 hours.  Recent Results (from the past 240 hour(s))  SARS Coronavirus 2 (CEPHEID- Performed in [place] hospital lab), Hosp Order     Status: None   Collection Time: 07/11/19  8:52 AM   Specimen: Nasopharyngeal Swab  Result Value Ref Range Status   SARS Coronavirus 2 NEGATIVE NEGATIVE Final    Comment: (NOTE) If result is NEGATIVE SARS-CoV-2 target nucleic acids are NOT DETECTED. The SARS-CoV-2 RNA is generally detectable in upper and lower  respiratory specimens during the acute phase of infection. The lowest  concentration of SARS-CoV-2 viral copies this assay can detect is 250  copies / mL. A negative result does not preclude SARS-CoV-2 infection  and should not be used as the sole basis for treatment or other  patient management decisions.  A negative result may occur with  improper  specimen collection / handling, submission of specimen other  than nasopharyngeal swab, presence of viral mutation(s) within the  areas targeted by this assay, and inadequate number of viral copies  (<250 copies / mL). A negative result must be combined with clinical  observations, patient history, and epidemiological information. If result is POSITIVE SARS-CoV-2 target nucleic acids are DETECTED. The SARS-CoV-2 RNA is generally detectable in upper and lower  respiratory specimens dur ing the acute phase of infection.  Positive  results are indicative of active infection with SARS-CoV-2.  Clinical  correlation with patient history and other diagnostic information is  necessary to determine patient infection status.  Positive results do  not rule out bacterial infection or co-infection with other viruses. If result is PRESUMPTIVE POSTIVE SARS-CoV-2 nucleic acids MAY BE PRESENT.   A presumptive positive result was obtained on the submitted specimen  and confirmed on repeat testing.  While 2019 novel coronavirus  (SARS-CoV-2) nucleic acids may be present in the submitted sample  additional confirmatory testing may be necessary for epidemiological  and / or clinical management purposes  to differentiate between  SARS-CoV-2 and other Sarbecovirus currently known to infect humans.  If clinically indicated additional testing with an alternate test  methodology [phone]) is advised. The SARS-CoV-2 RNA is generally  detectable in upper and lower respiratory sp ecimens during the acute  phase of infection. The expected result is Negative. Fact Sheet for Patients:  StrictlyIdeas.no Fact Sheet for Healthcare Providers: BankingDealers.co.za This test is not yet approved or cleared by the [place] FDA and has been authorized for detection and/or diagnosis of SARS-CoV-2 by FDA under an Emergency Use Authorization (EUA).  This EUA will remain in  effect (meaning this test can be used) for the duration of the COVID-19 declaration under Section 564(b)(1) of the Act, 21 U.S.C. section 360bbb-3(b)(1), unless the authorization is terminated or revoked sooner. Performed at [facility], [place] [address], [place], [place] [address]   MRSA PCR Screening     Status: None   Collection Time: 07/11/19  1:30 PM   Specimen: Nasal Mucosa; Nasopharyngeal  Result Value Ref Range Status   MRSA by PCR NEGATIVE NEGATIVE Final    Comment:        The GeneXpert MRSA Assay (FDA approved for NASAL specimens only), is one component of a comprehensive MRSA colonization surveillance program. It is not intended to diagnose MRSA infection nor to guide or monitor treatment for MRSA infections. Performed at [facility], [place] [address], [place], [place] [address]      Radiology Studies: Dg Chest Portable 1 View  Result Date: 07/11/2019 CLINICAL DATA:  Shortness of breath EXAM: PORTABLE CHEST 1 VIEW COMPARISON:  June 08, 2019 FINDINGS: Lungs are clear. Heart size and pulmonary vascularity are normal. No adenopathy. No pneumothorax. No bone lesions. IMPRESSION: No edema or consolidation. Electronically Signed   By: Lowella Grip III M.D.   On: 07/11/2019 08:31    Scheduled Meds: . aspirin EC  81 mg Oral Daily  . Chlorhexidine Gluconate Cloth  6 each Topical Q0600  . enoxaparin (LOVENOX) injection  40 mg Subcutaneous Q24H  . guaiFENesin  600 mg Oral BID  . ipratropium-albuterol  3 mL Nebulization Q4H  . lidocaine  1 patch Transdermal Q24H  . loratadine  10 mg Oral Daily  . mouth rinse  15 mL Mouth Rinse BID  . methylPREDNISolone (SOLU-MEDROL) injection  60 mg Intravenous Q6H  . mometasone-formoterol  2 puff Inhalation BID  . montelukast  10 mg Oral QHS  . nicotine  14 mg Transdermal Daily  . pantoprazole  40 mg Oral BID  . sertraline  100 mg Oral BID  . sodium chloride flush  3 mL Intravenous Q12H    Continuous Infusions: . sodium chloride       LOS: 0 days   Marylu Lund, MD [facility] Pager On Amion  If 7PM-7AM, please contact night-coverage 07/12/2019, 3:08 PM

## 2019-07-13 MED ORDER — IPRATROPIUM-ALBUTEROL 0.5-2.5 (3) MG/3ML IN SOLN
3.0000 mL | Freq: Three times a day (TID) | RESPIRATORY_TRACT | Status: DC
  Administered 2019-07-14 – 2019-07-15 (×5): 3 mL via RESPIRATORY_TRACT
  Filled 2019-07-13 (×5): qty 3

## 2019-07-13 MED ORDER — IPRATROPIUM-ALBUTEROL 0.5-2.5 (3) MG/3ML IN SOLN
3.0000 mL | Freq: Four times a day (QID) | RESPIRATORY_TRACT | Status: DC
  Administered 2019-07-13: 3 mL via RESPIRATORY_TRACT
  Filled 2019-07-13: qty 3

## 2019-07-13 MED ORDER — IPRATROPIUM BROMIDE HFA 17 MCG/ACT IN AERS: 2.0000 | INHALATION_SPRAY | RESPIRATORY_TRACT | Status: DC | PRN

## 2019-07-13 MED ORDER — KETOROLAC TROMETHAMINE 30 MG/ML IJ SOLN: 30.0000 mg | Freq: Three times a day (TID) | INTRAMUSCULAR | Status: DC | PRN

## 2019-07-13 MED ORDER — ALUM & MAG HYDROXIDE-SIMETH 200-200-20 MG/5ML PO SUSP
30.0000 mL | Freq: Once | ORAL | Status: AC
  Administered 2019-07-13: 30 mL via ORAL
  Filled 2019-07-13: qty 30

## 2019-07-13 MED ORDER — POLYETHYLENE GLYCOL 3350 17 G PO PACK
17.0000 g | PACK | Freq: Every day | ORAL | Status: DC | PRN
  Administered 2019-07-13: 17 g via ORAL
  Filled 2019-07-13 (×3): qty 1

## 2019-07-13 MED ORDER — IPRATROPIUM BROMIDE 0.02 % IN SOLN: 0.5000 mg | RESPIRATORY_TRACT | Status: DC | PRN

## 2019-07-13 MED ORDER — METHYLPREDNISOLONE SODIUM SUCC 40 MG IJ SOLR
40.0000 mg | Freq: Two times a day (BID) | INTRAMUSCULAR | Status: DC
  Administered 2019-07-13 – 2019-07-14 (×2): 40 mg via INTRAVENOUS
  Filled 2019-07-13 (×2): qty 1

## 2019-07-13 NOTE — Progress Notes (Signed)
PROGRESS NOTE    Kathleen [name]  YWV:[id number] DOB: [birth year] DOA: 07/11/2019 PCP: [name], MD    Brief Narrative:  58 y.o. female with past medical history significant for asthma/COPD, current smoker, bipolar disorder, drug use who presents complaining of shortness of breath.  Patient reports worsening shortness of breath over the last 2 days prior to admission.  This is accompanied by nausea and dry cough.  She reports that at her workplace, some coworkers were diagnosed with COVID-19.  She was not in close contact with them, but she went to the same room.  She has been vomiting since the day prior to admission, reported mild abdominal soreness.  She is also complaining of an exacerbation of her back pain and spasm.  She denies chest pain.  She reports post nasal drip, and reflux symptoms.    Evaluation in the ED: Sodium 142, potassium 3.7, glucose 103, BUN 17, creatinine 0.93, hemoglobin 14, white blood cell 7.3, platelets 274.  Chest x-ray: No edema or consolidation. COVID 19 Pending.  In the ED she received albuterol ipratropium inhale, IV Solu-Medrol, IV magnesium.. Patient appears to be less tachypneic.  He is currently on 4 L of oxygen.  Assessment & Plan:   Principal Problem:   COPD exacerbation ([place]) Active Problems:   BIPOLAR DISORDER UNSPECIFIED   GERD  1-Acute hypoxic respiratory failure: Secondary to COPD asthma exacerbation Patient present with shortness of breath, cough.  Current smoker. -COVID is neg -Still with marked end-expiratory wheezing and decreased BS throughout, presented with 4LNC -Still with marked wheezing on exam -Will continue IV steroids, decrease to 40mg  IV q12hrs -Continue with breathing tx as tolerated -Continue with Singulair, Dulera  2-GERD -Continue on PPI twice daily. -Education provided to patient at time of presentation -On further questioning, pt reports significant hx of GERD, was not taking PPI x 2 weeks prior to admit while  taking naproxen regularly -In hospital, patient reports continued GERD symptoms including hoarse voice, epigastric burning, "bad taste" in mouth -Will give trial of GI cocktail and continue BID PPI. Avoid NSAIDs and decrease steroids as tolerated. -Uncontrolled GERD may be contributing to presenting wheezing and sob  3-Current smoker; -Cessation done -continue nicotine patch  4-Bipolar -Continue with Zoloft as tolerated   5-Nausea, vomiting;  Related to acute illness.  -Seems improved this AM Continued with PRN zofran -Suspect contributing GERD per above  6-Chronic Back pain;  Resume percocet, Lidoderm  Patch.    DVT prophylaxis: Lovenox subq Code Status: Full Family Communication: Pt in room, family not at bedside Disposition Plan: Home when wheezing improves  Consultants:     Procedures:     Antimicrobials: Anti-infectives (From admission, onward)   None      Subjective: Has hoarse voice, continued wheezing  Objective: Vitals:   07/13/19 0657 07/13/19 0810 07/13/19 1355 07/13/19 1503  BP: 136/73   117/81  Pulse: 80   79  Resp: 16   17  Temp: 98 F (36.7 C)   98.9 F (37.2 C)  TempSrc: Oral   Oral  SpO2: 100% 93% 93% 96%  Weight:      Height:        Intake/Output Summary (Last 24 hours) at 07/13/2019 1521 Last data filed at 07/13/2019 0854 Gross per 24 hour  Intake 600 ml  Output -  Net 600 ml   Filed Weights   07/11/19 1341  Weight: 69.9 kg    Examination: General exam: Awake, laying in bed, in nad Respiratory system: Normal  respiratory effort, active wheezing throughout Cardiovascular system: regular rate, s1, s2 Gastrointestinal system: Soft, nondistended, positive BS Central nervous system: CN2-12 grossly intact, strength intact Extremities: Perfused, no clubbing Skin: Normal skin turgor, no notable skin lesions seen Psychiatry: Mood normal // no visual hallucinations    Data Reviewed: I have personally reviewed following labs  and imaging studies  CBC: Recent Labs  Lab 07/11/19 0809 07/12/19 0225  WBC 7.3 13.6*  NEUTROABS 4.3  --   HGB 14.1 12.6  HCT 46.4* 39.6  MCV 91.5 91.0  PLT 274 811   Basic Metabolic Panel: Recent Labs  Lab 07/11/19 0809 07/12/19 0225  NA 142 137  K 3.7 4.5  CL 108 104  CO2 26 25  GLUCOSE 103* 139*  BUN 17 16  CREATININE 0.93 0.77  CALCIUM 9.0 8.8*   GFR: Estimated Creatinine Clearance: 70.2 mL/min (by C-G formula based on SCr of 0.77 mg/dL). Liver Function Tests: Recent Labs  Lab 07/12/19 0225  AST 23  ALT 29  ALKPHOS 69  BILITOT 0.5  PROT 6.2*  ALBUMIN 3.4*   No results for input(s): LIPASE, AMYLASE in the last 168 hours. No results for input(s): AMMONIA in the last 168 hours. Coagulation Profile: No results for input(s): INR, PROTIME in the last 168 hours. Cardiac Enzymes: No results for input(s): CKTOTAL, CKMB, CKMBINDEX, TROPONINI in the last 168 hours. BNP (last 3 results) No results for input(s): PROBNP in the last 8760 hours. HbA1C: No results for input(s): HGBA1C in the last 72 hours. CBG: No results for input(s): GLUCAP in the last 168 hours. Lipid Profile: No results for input(s): CHOL, HDL, LDLCALC, TRIG, CHOLHDL, LDLDIRECT in the last 72 hours. Thyroid Function Tests: No results for input(s): TSH, T4TOTAL, FREET4, T3FREE, THYROIDAB in the last 72 hours. Anemia Panel: No results for input(s): VITAMINB12, FOLATE, FERRITIN, TIBC, IRON, RETICCTPCT in the last 72 hours. Sepsis Labs: No results for input(s): PROCALCITON, LATICACIDVEN in the last 168 hours.  Recent Results (from the past 240 hour(s))  SARS Coronavirus 2 (CEPHEID- Performed in [place] hospital lab), Hosp Order     Status: None   Collection Time: 07/11/19  8:52 AM   Specimen: Nasopharyngeal Swab  Result Value Ref Range Status   SARS Coronavirus 2 NEGATIVE NEGATIVE Final    Comment: (NOTE) If result is NEGATIVE SARS-CoV-2 target nucleic acids are NOT DETECTED. The  SARS-CoV-2 RNA is generally detectable in upper and lower  respiratory specimens during the acute phase of infection. The lowest  concentration of SARS-CoV-2 viral copies this assay can detect is 250  copies / mL. A negative result does not preclude SARS-CoV-2 infection  and should not be used as the sole basis for treatment or other  patient management decisions.  A negative result may occur with  improper specimen collection / handling, submission of specimen other  than nasopharyngeal swab, presence of viral mutation(s) within the  areas targeted by this assay, and inadequate number of viral copies  (<250 copies / mL). A negative result must be combined with clinical  observations, patient history, and epidemiological information. If result is POSITIVE SARS-CoV-2 target nucleic acids are DETECTED. The SARS-CoV-2 RNA is generally detectable in upper and lower  respiratory specimens dur ing the acute phase of infection.  Positive  results are indicative of active infection with SARS-CoV-2.  Clinical  correlation with patient history and other diagnostic information is  necessary to determine patient infection status.  Positive results do  not rule out bacterial infection or  co-infection with other viruses. If result is PRESUMPTIVE POSTIVE SARS-CoV-2 nucleic acids MAY BE PRESENT.   A presumptive positive result was obtained on the submitted specimen  and confirmed on repeat testing.  While 2019 novel coronavirus  (SARS-CoV-2) nucleic acids may be present in the submitted sample  additional confirmatory testing may be necessary for epidemiological  and / or clinical management purposes  to differentiate between  SARS-CoV-2 and other Sarbecovirus currently known to infect humans.  If clinically indicated additional testing with an alternate test  methodology [phone]) is advised. The SARS-CoV-2 RNA is generally  detectable in upper and lower respiratory sp ecimens during the acute   phase of infection. The expected result is Negative. Fact Sheet for Patients:  StrictlyIdeas.no Fact Sheet for Healthcare Providers: BankingDealers.co.za This test is not yet approved or cleared by the [place] FDA and has been authorized for detection and/or diagnosis of SARS-CoV-2 by FDA under an Emergency Use Authorization (EUA).  This EUA will remain in effect (meaning this test can be used) for the duration of the COVID-19 declaration under Section 564(b)(1) of the Act, 21 U.S.C. section 360bbb-3(b)(1), unless the authorization is terminated or revoked sooner. Performed at [facility], [place] [address] [place], [place] [address]   MRSA PCR Screening     Status: None   Collection Time: 07/11/19  1:30 PM   Specimen: Nasal Mucosa; Nasopharyngeal  Result Value Ref Range Status   MRSA by PCR NEGATIVE NEGATIVE Final    Comment:        The GeneXpert MRSA Assay (FDA approved for NASAL specimens only), is one component of a comprehensive MRSA colonization surveillance program. It is not intended to diagnose MRSA infection nor to guide or monitor treatment for MRSA infections. Performed at [facility], [place] [address], [place], [place] [address]      Radiology Studies: US Soft Tissue Head & Neck (non-thyroid)  Result Date: 07/12/2019 CLINICAL DATA:  58 year old female with left neck mass. EXAM: ULTRASOUND OF HEAD/NECK SOFT TISSUES TECHNIQUE: Ultrasound examination of the head and neck soft tissues was performed in the area of clinical concern. COMPARISON:  Noncontrast neck CT 06/08/2019. FINDINGS: Grayscale images of the left neck are provided demonstrating normal fibromuscular architecture and occasional small/physiologic left cervical lymph nodes (up to 3 millimeter short axis). No mass or abnormality is identified. IMPRESSION: Normal ultrasound appearance of the neck soft tissues in the area  clinical concern. Electronically Signed   By: Genevie Ann M.D.   On: 07/12/2019 16:15    Scheduled Meds: . aspirin EC  81 mg Oral Daily  . Chlorhexidine Gluconate Cloth  6 each Topical Q0600  . enoxaparin (LOVENOX) injection  40 mg Subcutaneous Q24H  . guaiFENesin  600 mg Oral BID  . ipratropium-albuterol  3 mL Nebulization Q6H  . lidocaine  1 patch Transdermal Q24H  . loratadine  10 mg Oral Daily  . mouth rinse  15 mL Mouth Rinse BID  . methylPREDNISolone (SOLU-MEDROL) injection  40 mg Intravenous Q12H  . mometasone-formoterol  2 puff Inhalation BID  . montelukast  10 mg Oral QHS  . nicotine  14 mg Transdermal Daily  . pantoprazole  40 mg Oral BID  . sertraline  100 mg Oral BID  . sodium chloride flush  3 mL Intravenous Q12H   Continuous Infusions: . sodium chloride       LOS: 1 day   Marylu Lund, MD [facility] Pager On Amion  If 7PM-7AM, please contact night-coverage  07/13/2019, 3:21 PM

## 2019-07-13 NOTE — Evaluation (Signed)
Clinical/Bedside Swallow Evaluation Patient Details  Name: Kathleen [name] MRN: [id number] Date of Birth: 12/08/1961  Today's Date: 07/13/2019 Time: SLP Start Time (ACUTE ONLY): 5035 SLP Stop Time (ACUTE ONLY): 4656 SLP Time Calculation (min) (ACUTE ONLY): 30 min  Past Medical History:  Past Medical History:  Diagnosis Date  . Asthma    2 sets of PFT's in 04/09 without sign variability. Last set with significant decrease in FEV1 with saline alone, suggesting Asthma but  recommended clinical corelation   . Asthma   . Bipolar 1 disorder [facility])    therapist is Caryl Asp and is followed by [employer]  . Blackout    negative work up including ESR, ANA, opthalmology referral, carotid dopplers, 2D echo, MRI and EEG.  . BREAST LUMP 03/25/2008   Annotation: bilaterally Qualifier: Diagnosis of  By: Tomasa Hosteller MD, Edmon Crape.   . 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 Bronchitis   . Chronic headache   . Chronic low back pain 08/08/2008   Qualifier: Diagnosis of  By: Redmond Pulling  MD, Mateo Flow    . Chronic pain    normal work up including TSH, RPR, B12, HIV, plain films, 2 ESR's, ANA, CK, RF along with routine CBC, CMET and UA. Further work up includes CRP, ESR, SPEP/UPEP, hepatitis erology, A1C , repeat ANA  . COPD (chronic obstructive pulmonary disease) ([place])   . COPD exacerbation ([place]) 03/31/2019  . DUB (dysfunctional uterine bleeding)    and pelvic pain, with negative endometrial bx in 07/09 and transvaginal U/S significant for mild fibroids in 0/09.  Marland Kitchen GERD (gastroesophageal reflux disease)   . Hallucinations 03/18/2008   Qualifier: Diagnosis of  By: Redmond Pulling  MD, Mateo Flow    . Lower extremity edema    Neg ABI's, normal 2D echo, normal albumin  . Menorrhagia   . Ovarian cyst   . Polysubstance abuse ([place])    none since March 17,2009.  Marland Kitchen Sleep apnea    NO CPAP  . Tubular adenoma of colon    Past Surgical History:  Past Surgical History:   Procedure Laterality Date  . CESAREAN SECTION    . CESAREAN SECTION    . HERNIA REPAIR    . Left partial oophorectomy    . OOPHORECTOMY     1/2 ovary removed  . VAGINA SURGERY     mesh   HPI:  58 yo female adm to [facility] with dyspnea.  CXR negative.  PMH + for Bipolar I, GERD, TIA, dysphagia, current smoker, cerivcal spondylosis.  Pt  CXR 7/29 negative.  She had an [place] that was normal.  Pt reports she vomited all of her meal today.  She is on a PPI.  Reported symptoms of food lodging on left side of throat, hoarseness and bringing up food particles in 08/13/2016 when she had an MBS.  MBS with mild oral deficits, negative pharyngeal deficits.  Advised at that time to consider esopahgram.  Pt also now states she had a sleep study that showed sleep apnea and that she needed to have a Cpap but she could not afford it   Assessment / Plan / Recommendation Clinical Impression  Pt presents with functional oropharyngeal swallow ability based on clinical swallow evaluation with intake of peaches and water.  No indication of aspiration with intake Yale 3 ounce water test.  Post-swallow belch and expectoration of water noted.  Pt with negative CN exam except her report of "pins and  needles" on left side of head "temporal region" which has been present intermittently since last Wednesday.  Pt underwent an OP MBS in September 2017 = for which she was referred for the same exact symptoms.  She reports symptoms are worse now than they were then (hoarseness, sensation of food lodging in throat on left, bring up food particles, etc).  Recommend consider esophageal evaluation to determine if source that may be mitigated.  Also reports problems with blood sugars since early July - sugars measuring from 40-80-110-220.   She is questioning diabetes?  Will follow up next week x1 to determine if any precautions may be helpful. SLP Visit Diagnosis: Dysphagia, unspecified (R13.10)    Aspiration Risk  Mild aspiration risk     Diet Recommendation Regular   Liquid Administration via: Cup;Straw Medication Administration: Whole meds with liquid Supervision: Patient able to self feed Compensations: Slow rate;Small sips/bites Postural Changes: Seated upright at 90 degrees;Remain upright for at least 30 minutes after po intake    Other  Recommendations Recommended Consults: Consider esophageal assessment Oral Care Recommendations: Oral care QID   Follow up Recommendations None      Frequency and Duration min 1 x/week  1 week       Prognosis Prognosis for Safe Diet Advancement: Good      Swallow Study   General Date of Onset: 07/13/19 HPI: 58 yo female adm to [facility] with dyspnea.  CXR negative.  PMH + for Bipolar I, GERD, TIA, dysphagia, current smoker, cerivcal spondylosis.  Pt  CXR 7/29 negative.  She had an [place] that was normal.  Pt reports she vomited all of her meal today.  She is on a PPI.  Reported symptoms of food lodging on left side of throat, hoarseness and bringing up food particles in 08/13/2016 when she had an MBS.  MBS with mild oral deficits, negative pharyngeal deficits.  Advised at that time to consider esopahgram.  Pt also now states she had a sleep study that showed sleep apnea and that she needed to have a Cpap but she could not afford it Type of Study: Bedside Swallow Evaluation Previous Swallow Assessment: MBS 08/13/2018 negative except mild oral deficits and concerns for esophageal problems Diet Prior to this Study: Regular;Thin liquids Temperature Spikes Noted: No Respiratory Status: Room air History of Recent Intubation: No Behavior/Cognition: Alert;Cooperative;Pleasant mood Oral Cavity Assessment: Within Functional Limits Oral Care Completed by SLP: No Oral Cavity - Dentition: Adequate natural dentition(some dentition missing) Self-Feeding Abilities: Able to feed self Patient Positioning: Upright in bed Baseline Vocal Quality: Hoarse(pt states her voice "comes and goes") Volitional  Cough: Strong Volitional Swallow: Able to elicit    Oral/Motor/Sensory Function Facial Sensation: (pins and needles left upper facial, temportal lobe intermittent)   Ice Chips Ice chips: Not tested   Thin Liquid Thin Liquid: Within functional limits Presentation: Cup Other Comments: 3 ounce water test passed easily, post-swallow exepectoration of minimal amount of secretions which pt states is normal for her, ? esophageal component    Nectar Thick Nectar Thick Liquid: Not tested   Honey Thick Honey Thick Liquid: Not tested   Puree Puree: Not tested   Solid     Solid: Within functional limits Presentation: Self Fed;Spoon      Macario Golds 07/13/2019,4:53 PM   Luanna Salk, [place]ate Pager [phone] Office [phone]

## 2019-07-13 NOTE — Progress Notes (Signed)
Pt asked if she could have miralax to help her bowels move. Request put in to [place].

## 2019-07-14 ENCOUNTER — Inpatient Hospital Stay (HOSPITAL_COMMUNITY): Payer: Self-pay

## 2019-07-14 ENCOUNTER — Encounter: Payer: Self-pay | Admitting: Internal Medicine

## 2019-07-14 LAB — CBC
HCT: 41.3 % (ref 36.0–46.0)
Hemoglobin: 12.7 g/dL (ref 12.0–15.0)
MCH: 28 pg (ref 26.0–34.0)
MCHC: 30.8 g/dL (ref 30.0–36.0)
MCV: 91 fL (ref 80.0–100.0)
Platelets: 272 10*3/uL (ref 150–400)
RBC: 4.54 MIL/uL (ref 3.87–5.11)
RDW: 13.2 % (ref 11.5–15.5)
WBC: 16 10*3/uL — ABNORMAL HIGH (ref 4.0–10.5)
nRBC: 0 % (ref 0.0–0.2)

## 2019-07-14 MED ORDER — METOCLOPRAMIDE HCL 5 MG PO TABS
5.0000 mg | ORAL_TABLET | Freq: Three times a day (TID) | ORAL | Status: DC
  Administered 2019-07-14 – 2019-07-15 (×4): 5 mg via ORAL
  Filled 2019-07-14 (×4): qty 1

## 2019-07-14 MED ORDER — METHYLPREDNISOLONE SODIUM SUCC 40 MG IJ SOLR
40.0000 mg | INTRAMUSCULAR | Status: DC
  Administered 2019-07-15: 40 mg via INTRAVENOUS
  Filled 2019-07-14: qty 1

## 2019-07-14 MED ORDER — LACTULOSE 10 GM/15ML PO SOLN
20.0000 g | Freq: Once | ORAL | Status: AC
  Administered 2019-07-14: 20 g via ORAL
  Filled 2019-07-14: qty 30

## 2019-07-14 MED ORDER — DIPHENHYDRAMINE HCL 50 MG/ML IJ SOLN
12.5000 mg | Freq: Once | INTRAMUSCULAR | Status: AC
  Administered 2019-07-14: 12.5 mg via INTRAVENOUS
  Filled 2019-07-14: qty 1

## 2019-07-14 MED ORDER — METOCLOPRAMIDE HCL 5 MG/ML IJ SOLN
5.0000 mg | Freq: Once | INTRAMUSCULAR | Status: AC
  Administered 2019-07-14: 5 mg via INTRAVENOUS
  Filled 2019-07-14: qty 2

## 2019-07-14 MED ORDER — BENZONATATE 100 MG PO CAPS
100.0000 mg | ORAL_CAPSULE | Freq: Three times a day (TID) | ORAL | Status: DC | PRN
  Administered 2019-07-15: 100 mg via ORAL
  Filled 2019-07-14 (×3): qty 1

## 2019-07-14 MED ORDER — KETOROLAC TROMETHAMINE 15 MG/ML IJ SOLN
15.0000 mg | Freq: Once | INTRAMUSCULAR | Status: AC
  Administered 2019-07-14: 15 mg via INTRAVENOUS
  Filled 2019-07-14: qty 1

## 2019-07-14 NOTE — Progress Notes (Signed)
PROGRESS NOTE    Kathleen [name]  WUX:[id number] DOB: 07/08/1961 DOA: 07/11/2019 PCP: [name], MD    Brief Narrative:  58 y.o. female with past medical history significant for asthma/COPD, current smoker, bipolar disorder, drug use who presents complaining of shortness of breath.  Patient reports worsening shortness of breath over the last 2 days prior to admission.  This is accompanied by nausea and dry cough.  She reports that at her workplace, some coworkers were diagnosed with COVID-19.  She was not in close contact with them, but she went to the same room.  She has been vomiting since the day prior to admission, reported mild abdominal soreness.  She is also complaining of an exacerbation of her back pain and spasm.  She denies chest pain.  She reports post nasal drip, and reflux symptoms.    Evaluation in the ED: Sodium 142, potassium 3.7, glucose 103, BUN 17, creatinine 0.93, hemoglobin 14, white blood cell 7.3, platelets 274.  Chest x-ray: No edema or consolidation. COVID 19 Pending.  In the ED she received albuterol ipratropium inhale, IV Solu-Medrol, IV magnesium.. Patient appears to be less tachypneic.  He is currently on 4 L of oxygen.  Assessment & Plan:   Principal Problem:   COPD exacerbation ([place]) Active Problems:   BIPOLAR DISORDER UNSPECIFIED   GERD  1-Acute hypoxic respiratory failure: Secondary to COPD asthma exacerbation Patient present with shortness of breath, cough.  Current smoker. -COVID is neg -Still with marked end-expiratory wheezing and decreased BS throughout, presented with 4LNC -Still with marked wheezing on exam, seems be upper airway -Will continue IV steroids, decrease to 40mg  IV daily -Continue with breathing tx as tolerated -Continue with Singulair, Dulera  2-GERD -Continue on PPI twice daily. -Education provided to patient at time of presentation -On further questioning, pt reports significant hx of GERD, was not taking PPI x 2  weeks prior to admit while taking naproxen regularly -In hospital, patient reports continued GERD symptoms including hoarse voice, epigastric burning, "bad taste" in mouth -Improvement with trial of GI cocktail and will continue BID PPI. Avoid NSAIDs and decrease steroids as tolerated. -Uncontrolled GERD may be contributing to presenting wheezing and sob  3-Current smoker; -Cessation done -continue nicotine patch as tolerated  4-Bipolar -Continue with Zoloft as tolerated   5-Nausea, vomiting;  Related to acute illness.  -Seems improved this AM Continued with PRN zofran -Suspect contributing GERD per above  6-Chronic Back pain;  Resume percocet, Lidoderm  Patch.   7. Esophageal dysmotility -esophogram personally reviewed. Findings suggestive of dysmotility -Will give trial of reglan  8. Migraine -Pt with headache associated with aura, photophobia and phonophobia -Will give trial of toradol, reglan, benadryl x1   DVT prophylaxis: Lovenox subq Code Status: Full Family Communication: Pt in room, family not at bedside Disposition Plan: Home when wheezing improves  Consultants:     Procedures:     Antimicrobials: Anti-infectives (From admission, onward)   None      Subjective: Still wheezing. Complaining of headache  Objective: Vitals:   07/14/19 0801 07/14/19 0802 07/14/19 1337 07/14/19 1528  BP:    111/60  Pulse:    65  Resp:    14  Temp:    98.6 F (37 C)  TempSrc:    Oral  SpO2: 97% 97% 95% 94%  Weight:      Height:        Intake/Output Summary (Last 24 hours) at 07/14/2019 1817 Last data filed at 07/14/2019 0200 Gross per  24 hour  Intake 120 ml  Output -  Net 120 ml   Filed Weights   07/11/19 1341  Weight: 69.9 kg    Examination: General exam: Conversant, in no acute distress Respiratory system: normal chest rise, loud wheezing, hoarse voice Cardiovascular system: regular rhythm, s1-s2 Gastrointestinal system: Nondistended, nontender,  pos BS Central nervous system: No seizures, no tremors Extremities: No cyanosis, no joint deformities Skin: No rashes, no pallor Psychiatry: Affect normal // no auditory hallucinations   Data Reviewed: I have personally reviewed following labs and imaging studies  CBC: Recent Labs  Lab 07/11/19 0809 07/12/19 0225 07/14/19 0547  WBC 7.3 13.6* 16.0*  NEUTROABS 4.3  --   --   HGB 14.1 12.6 12.7  HCT 46.4* 39.6 41.3  MCV 91.5 91.0 91.0  PLT 274 265 222   Basic Metabolic Panel: Recent Labs  Lab 07/11/19 0809 07/12/19 0225  NA 142 137  K 3.7 4.5  CL 108 104  CO2 26 25  GLUCOSE 103* 139*  BUN 17 16  CREATININE 0.93 0.77  CALCIUM 9.0 8.8*   GFR: Estimated Creatinine Clearance: 70.2 mL/min (by C-G formula based on SCr of 0.77 mg/dL). Liver Function Tests: Recent Labs  Lab 07/12/19 0225  AST 23  ALT 29  ALKPHOS 69  BILITOT 0.5  PROT 6.2*  ALBUMIN 3.4*   No results for input(s): LIPASE, AMYLASE in the last 168 hours. No results for input(s): AMMONIA in the last 168 hours. Coagulation Profile: No results for input(s): INR, PROTIME in the last 168 hours. Cardiac Enzymes: No results for input(s): CKTOTAL, CKMB, CKMBINDEX, TROPONINI in the last 168 hours. BNP (last 3 results) No results for input(s): PROBNP in the last 8760 hours. HbA1C: No results for input(s): HGBA1C in the last 72 hours. CBG: No results for input(s): GLUCAP in the last 168 hours. Lipid Profile: No results for input(s): CHOL, HDL, LDLCALC, TRIG, CHOLHDL, LDLDIRECT in the last 72 hours. Thyroid Function Tests: No results for input(s): TSH, T4TOTAL, FREET4, T3FREE, THYROIDAB in the last 72 hours. Anemia Panel: No results for input(s): VITAMINB12, FOLATE, FERRITIN, TIBC, IRON, RETICCTPCT in the last 72 hours. Sepsis Labs: No results for input(s): PROCALCITON, LATICACIDVEN in the last 168 hours.  Recent Results (from the past 240 hour(s))  SARS Coronavirus 2 (CEPHEID- Performed in [place]  hospital lab), Hosp Order     Status: None   Collection Time: 07/11/19  8:52 AM   Specimen: Nasopharyngeal Swab  Result Value Ref Range Status   SARS Coronavirus 2 NEGATIVE NEGATIVE Final    Comment: (NOTE) If result is NEGATIVE SARS-CoV-2 target nucleic acids are NOT DETECTED. The SARS-CoV-2 RNA is generally detectable in upper and lower  respiratory specimens during the acute phase of infection. The lowest  concentration of SARS-CoV-2 viral copies this assay can detect is 250  copies / mL. A negative result does not preclude SARS-CoV-2 infection  and should not be used as the sole basis for treatment or other  patient management decisions.  A negative result may occur with  improper specimen collection / handling, submission of specimen other  than nasopharyngeal swab, presence of viral mutation(s) within the  areas targeted by this assay, and inadequate number of viral copies  (<250 copies / mL). A negative result must be combined with clinical  observations, patient history, and epidemiological information. If result is POSITIVE SARS-CoV-2 target nucleic acids are DETECTED. The SARS-CoV-2 RNA is generally detectable in upper and lower  respiratory specimens dur ing the  acute phase of infection.  Positive  results are indicative of active infection with SARS-CoV-2.  Clinical  correlation with patient history and other diagnostic information is  necessary to determine patient infection status.  Positive results do  not rule out bacterial infection or co-infection with other viruses. If result is PRESUMPTIVE POSTIVE SARS-CoV-2 nucleic acids MAY BE PRESENT.   A presumptive positive result was obtained on the submitted specimen  and confirmed on repeat testing.  While 2019 novel coronavirus  (SARS-CoV-2) nucleic acids may be present in the submitted sample  additional confirmatory testing may be necessary for epidemiological  and / or clinical management purposes  to differentiate  between  SARS-CoV-2 and other Sarbecovirus currently known to infect humans.  If clinically indicated additional testing with an alternate test  methodology [phone]) is advised. The SARS-CoV-2 RNA is generally  detectable in upper and lower respiratory sp ecimens during the acute  phase of infection. The expected result is Negative. Fact Sheet for Patients:  StrictlyIdeas.no Fact Sheet for Healthcare Providers: BankingDealers.co.za This test is not yet approved or cleared by the [place] FDA and has been authorized for detection and/or diagnosis of SARS-CoV-2 by FDA under an Emergency Use Authorization (EUA).  This EUA will remain in effect (meaning this test can be used) for the duration of the COVID-19 declaration under Section 564(b)(1) of the Act, 21 U.S.C. section 360bbb-3(b)(1), unless the authorization is terminated or revoked sooner. Performed at [facility], [place] [address] [place], [place] [address]   MRSA PCR Screening     Status: None   Collection Time: 07/11/19  1:30 PM   Specimen: Nasal Mucosa; Nasopharyngeal  Result Value Ref Range Status   MRSA by PCR NEGATIVE NEGATIVE Final    Comment:        The GeneXpert MRSA Assay (FDA approved for NASAL specimens only), is one component of a comprehensive MRSA colonization surveillance program. It is not intended to diagnose MRSA infection nor to guide or monitor treatment for MRSA infections. Performed at [facility], [place] [address] [place], [place] [address]      Radiology Studies: Dg Esophagus W Single Cm (sol Or Thin Ba)  Result Date: 07/14/2019 CLINICAL DATA:  Dysphagia, vomiting EXAM: ESOPHOGRAM / BARIUM SWALLOW / BARIUM TABLET STUDY TECHNIQUE: Combined double contrast and single contrast examination performed using effervescent crystals, thick barium liquid, and thin barium liquid. The patient was observed with fluoroscopy  swallowing a 13 mm barium sulphate tablet. FLUOROSCOPY TIME:  Fluoroscopy Time:  2 minutes 48 seconds Radiation Exposure Index (if provided by the fluoroscopic device): 33 mGy COMPARISON:  06/08/2019 CT neck FINDINGS: Initial cervical esophagram demonstrates no obstruction with a normal swallowing initiation. Esophagus is patent without obstruction, irregularity, stricture or narrowing. No significant hiatal hernia. During the exam there is disruption of the primary esophageal wave resulting in esophageal dysmotility/stasis. This persists throughout the study and despite multiple dry swallows to clear her esophagus the esophageal stasis persist. There is associated mild reflux noted. Barium tablet with water passed without difficulty. With the patient LPO, there is again demonstration of esophageal dysmotility and esophageal stasis with some reflux back into the cervical esophagus. This reproduces her symptoms when asked. IMPRESSION: Esophageal dysmotility with esophageal stasis and mild reflux as above. Electronically Signed   By: Jerilynn Mages.  Shick M.D.   On: 07/14/2019 11:50    Scheduled Meds: . aspirin EC  81 mg Oral Daily  . Chlorhexidine Gluconate Cloth  6 each Topical Q0600  .  enoxaparin (LOVENOX) injection  40 mg Subcutaneous Q24H  . guaiFENesin  600 mg Oral BID  . ipratropium-albuterol  3 mL Nebulization TID  . lidocaine  1 patch Transdermal Q24H  . loratadine  10 mg Oral Daily  . mouth rinse  15 mL Mouth Rinse BID  . [START ON 07/15/2019] methylPREDNISolone (SOLU-MEDROL) injection  40 mg Intravenous Q24H  . metoCLOPramide  5 mg Oral TID AC  . mometasone-formoterol  2 puff Inhalation BID  . montelukast  10 mg Oral QHS  . nicotine  14 mg Transdermal Daily  . pantoprazole  40 mg Oral BID  . sertraline  100 mg Oral BID  . sodium chloride flush  3 mL Intravenous Q12H   Continuous Infusions: . sodium chloride       LOS: 2 days   Marylu Lund, MD [facility] Pager On Amion  If 7PM-7AM,  please contact night-coverage 07/14/2019, 6:17 PM

## 2019-07-15 MED ORDER — SUMATRIPTAN SUCCINATE 25 MG PO TABS
25.0000 mg | ORAL_TABLET | ORAL | Status: DC | PRN
  Filled 2019-07-15: qty 1

## 2019-07-15 MED ORDER — SUMATRIPTAN SUCCINATE 25 MG PO TABS: 25.0000 mg | ORAL_TABLET | ORAL | 0 refills | Status: DC | PRN

## 2019-07-15 MED ORDER — PREDNISONE 10 MG PO TABS: ORAL_TABLET | ORAL | Status: DC

## 2019-07-15 MED ORDER — LIDOCAINE 5 % EX PTCH: 1.0000 | MEDICATED_PATCH | CUTANEOUS | 0 refills | Status: DC

## 2019-07-15 MED ORDER — METOCLOPRAMIDE HCL 5 MG PO TABS: 5.0000 mg | ORAL_TABLET | Freq: Three times a day (TID) | ORAL | 0 refills | Status: DC

## 2019-07-15 MED ORDER — EPINEPHRINE 0.3 MG/0.3ML IJ SOAJ: 0.3000 mg | INTRAMUSCULAR | 1 refills | Status: DC | PRN

## 2019-07-15 MED ORDER — PANTOPRAZOLE SODIUM 40 MG PO TBEC: 40.0000 mg | DELAYED_RELEASE_TABLET | Freq: Two times a day (BID) | ORAL | 0 refills | Status: DC

## 2019-07-15 MED ORDER — NICOTINE 14 MG/24HR TD PT24: 14.0000 mg | MEDICATED_PATCH | Freq: Every day | TRANSDERMAL | 0 refills | Status: DC

## 2019-07-15 NOTE — Discharge Instructions (Signed)
Five days of medications prescribed at discharge. Please follow up with your PCP for additional refills through your mail order pharmacy

## 2019-07-15 NOTE — Discharge Summary (Signed)
Physician Discharge Summary  Kathleen [name] GLO:[id number] DOB: Feb 22, 1961 DOA: 07/11/2019  PCP: [name], MD  Admit date: 07/11/2019 Discharge date: 07/15/2019  Admitted From: Home Disposition:  Home  Recommendations for Outpatient Follow-up:  1. Follow up with PCP in 1 week 2. Avoid NSAIDs given concern of significant reflux 3. If patient continues with severe reflux symptoms, consider outpatient referral to GI 4. Please refill medications to mail order pharmacy as needed  Discharge Condition:Improved CODE STATUS:Full Diet recommendation: Regular   Brief/Interim Summary: 58 y.o.femalewith past medical history significant for asthma/COPD, current smoker, bipolar disorder, drug use who presents complaining of shortness of breath.Patient reports worsening shortness of breath over the last 2 days prior to admission. This is accompanied by nausea and dry cough. She reports that at her workplace, some coworkers were diagnosed with COVID-19. She was not in close contact with them,but she went to the same room.She has been vomiting since the day prior to admission, reported mild abdominal soreness. She is also complaining of an exacerbation of her back pain and spasm.She denies chest pain.She reports postnasal drip, and reflux symptoms.   Evaluation in the ED: Sodium 142, potassium 3.7, glucose 103, BUN 17, creatinine 0.93, hemoglobin 14, white blood cell 7.3, platelets 274.Chest x-ray: No edema or consolidation. COVID 19 Pending. In the ED she received albuterol ipratropium inhale, IV Solu-Medrol, IV magnesium..Patient appears to be less tachypneic. He is currently on 4 L of oxygen.  Discharge Diagnoses:  Principal Problem:   COPD exacerbation ([place]) Active Problems:   BIPOLAR DISORDER UNSPECIFIED   Migraine variant   GERD   Sacral back pain  1-Acute hypoxic respiratory failure: Secondary to COPD asthma exacerbation -Patient present with shortness of breath,  cough. Current smoker. -COVID is neg -Still with marked end-expiratory wheezing and decreased BS throughout, presented with 4LNC -Still with marked wheezing on exam, seems be upper airway -Successfully weaned steroids to prednisone taper on d/c -Continue with Singulair, Dulera as tolerated  2-GERD -Continue on PPI twice daily. -Education provided to patient at time of presentation -On further questioning, pt reports significant hx of GERD, was not taking PPI x 2 weeks prior to admit while taking naproxen regularly -In hospital, patient reports continued GERD symptoms including hoarse voice, epigastric burning, "bad taste" in mouth -Improvement with trial of GI cocktail and will continue BID PPI. Avoid NSAIDs and decrease steroids if possible. -Uncontrolled GERD may be contributing to presenting wheezing and sob  3-Current smoker; -Cessation done -continued nicotine patch as tolerated  4-Bipolar -Continue with Zoloft as tolerated  5-Nausea, vomiting;  Related to acute illness.  -Seems improved this AM Continued with PRN zofran while in hospital -Suspect contributing GERD per above  6-Chronic Back pain;  Resumed percocet, Lidoderm Patch.   7. Esophageal dysmotility -esophogram personally reviewed. Findings suggestive of dysmotility -Pt reports improvement with reglan  8. Migraine -Pt with headache associated with aura, photophobia and phonophobia -Improvement with trial of toradol, reglan, benadryl x1 -Have prescribed imitrex on d/c   Discharge Instructions   Allergies as of 07/15/2019      Reactions   Topiramate Other (See Comments)   Reaction:  Hallucinations    Tramadol Nausea Only      Medication List    STOP taking these medications   ibuprofen 200 MG tablet Commonly known as: ADVIL   omeprazole 20 MG capsule Commonly known as: PRILOSEC     TAKE these medications   albuterol 108 (90 Base) MCG/ACT inhaler Commonly known as: VENTOLIN HFA Inhale  2 puffs into the lungs every 4 (four) hours as needed for wheezing or shortness of breath.   aspirin EC 81 MG tablet Take 81 mg by mouth daily.   EPINEPHrine 0.3 mg/0.3 mL Soaj injection Commonly known as: EPI-PEN Inject 0.3 mLs (0.3 mg total) into the muscle as needed for anaphylaxis.   fluticasone 50 MCG/ACT nasal spray Commonly known as: Flonase Place 1 spray into both nostrils daily.   ipratropium-albuterol 0.5-2.5 (3) MG/3ML Soln Commonly known as: DUONEB Take 3 mLs by nebulization every 6 (six) hours as needed (wheezing or shortness of breath).   lidocaine 5 % Commonly known as: LIDODERM Place 1 patch onto the skin daily. Remove & Discard patch within 12 hours or as directed by MD What changed: Another medication with the same name was added. Make sure you understand how and when to take each.   lidocaine 5 % Commonly known as: LIDODERM Place 1 patch onto the skin daily. Remove & Discard patch within 12 hours or as directed by MD What changed: You were already taking a medication with the same name, and this prescription was added. Make sure you understand how and when to take each.   loratadine 10 MG tablet Commonly known as: Claritin Take 1 tablet (10 mg total) by mouth daily.   metoCLOPramide 5 MG tablet Commonly known as: REGLAN Take 1 tablet (5 mg total) by mouth 3 (three) times daily before meals for 5 days.   mometasone-formoterol 200-5 MCG/ACT Aero Commonly known as: Dulera Inhale 2 puffs into the lungs 2 (two) times daily.   montelukast 10 MG tablet Commonly known as: SINGULAIR Take 1 tablet (10 mg total) by mouth at bedtime.   nicotine 14 mg/24hr patch Commonly known as: NICODERM CQ - dosed in mg/24 hours Place 1 patch (14 mg total) onto the skin daily. Start taking on: July 16, 2019   oxyCODONE-acetaminophen 5-325 MG tablet Commonly known as: PERCOCET/ROXICET Take 1 tablet by mouth every 6 (six) hours as needed for severe pain.   pantoprazole 40  MG tablet Commonly known as: PROTONIX Take 1 tablet (40 mg total) by mouth 2 (two) times daily for 5 days.   predniSONE 10 MG tablet Commonly known as: DELTASONE Taper dose: 40mg  po daily x 3 days, then 20mg  po daily x 3 days, then 10mg  po daily x 3 days, then 5mg  po daily x 2 days, then stop. Zero refills What changed:   medication strength  how much to take  how to take this  when to take this  additional instructions   sertraline 100 MG tablet Commonly known as: ZOLOFT Take 100 mg by mouth 2 (two) times a day.   SUMAtriptan 25 MG tablet Commonly known as: IMITREX Take 1 tablet (25 mg total) by mouth every 2 (two) hours as needed for migraine or headache. May repeat in 2 hours if headache persists or recurs.   Tiotropium Bromide Monohydrate 2.5 MCG/ACT Aers Commonly known as: Spiriva Respimat Inhale 2 puffs into the lungs daily.       Allergies  Allergen Reactions  . Topiramate Other (See Comments)    Reaction:  Hallucinations   . Tramadol Nausea Only     Procedures/Studies: US Soft Tissue Head & Neck (non-thyroid)  Result Date: 07/12/2019 CLINICAL DATA:  58 year old female with left neck mass. EXAM: ULTRASOUND OF HEAD/NECK SOFT TISSUES TECHNIQUE: Ultrasound examination of the head and neck soft tissues was performed in the area of clinical concern. COMPARISON:  Noncontrast neck CT 06/08/2019. FINDINGS:  Grayscale images of the left neck are provided demonstrating normal fibromuscular architecture and occasional small/physiologic left cervical lymph nodes (up to 3 millimeter short axis). No mass or abnormality is identified. IMPRESSION: Normal ultrasound appearance of the neck soft tissues in the area clinical concern. Electronically Signed   By: Genevie Ann M.D.   On: 07/12/2019 16:15   Dg Chest Portable 1 View  Result Date: 07/11/2019 CLINICAL DATA:  Shortness of breath EXAM: PORTABLE CHEST 1 VIEW COMPARISON:  June 08, 2019 FINDINGS: Lungs are clear. Heart size and  pulmonary vascularity are normal. No adenopathy. No pneumothorax. No bone lesions. IMPRESSION: No edema or consolidation. Electronically Signed   By: Lowella Grip III M.D.   On: 07/11/2019 08:31   Dg Esophagus W Single Cm (sol Or Thin Ba)  Result Date: 07/14/2019 CLINICAL DATA:  Dysphagia, vomiting EXAM: ESOPHOGRAM / BARIUM SWALLOW / BARIUM TABLET STUDY TECHNIQUE: Combined double contrast and single contrast examination performed using effervescent crystals, thick barium liquid, and thin barium liquid. The patient was observed with fluoroscopy swallowing a 13 mm barium sulphate tablet. FLUOROSCOPY TIME:  Fluoroscopy Time:  2 minutes 48 seconds Radiation Exposure Index (if provided by the fluoroscopic device): 33 mGy COMPARISON:  06/08/2019 CT neck FINDINGS: Initial cervical esophagram demonstrates no obstruction with a normal swallowing initiation. Esophagus is patent without obstruction, irregularity, stricture or narrowing. No significant hiatal hernia. During the exam there is disruption of the primary esophageal wave resulting in esophageal dysmotility/stasis. This persists throughout the study and despite multiple dry swallows to clear her esophagus the esophageal stasis persist. There is associated mild reflux noted. Barium tablet with water passed without difficulty. With the patient LPO, there is again demonstration of esophageal dysmotility and esophageal stasis with some reflux back into the cervical esophagus. This reproduces her symptoms when asked. IMPRESSION: Esophageal dysmotility with esophageal stasis and mild reflux as above. Electronically Signed   By: Jerilynn Mages.  Shick M.D.   On: 07/14/2019 11:50    Subjective: Eager to go home  Discharge Exam: Vitals:   07/15/19 0746 07/15/19 0747  BP:    Pulse:    Resp:    Temp:    SpO2: 98% 98%   Vitals:   07/14/19 2119 07/15/19 0542 07/15/19 0746 07/15/19 0747  BP:  125/77    Pulse:  63    Resp:  14    Temp:  98.5 F (36.9 C)    TempSrc:   Oral    SpO2: 94% 96% 98% 98%  Weight:      Height:        General: Pt is alert, awake, not in acute distress Cardiovascular: RRR, S1/S2 +, no rubs, no gallops Respiratory: CTA bilaterally, wheezing, no rhonchi Abdominal: Soft, NT, ND, bowel sounds + Extremities: no edema, no cyanosis   The results of significant diagnostics from this hospitalization (including imaging, microbiology, ancillary and laboratory) are listed below for reference.     Microbiology: Recent Results (from the past 240 hour(s))  SARS Coronavirus 2 (CEPHEID- Performed in [place] hospital lab), Hosp Order     Status: None   Collection Time: 07/11/19  8:52 AM   Specimen: Nasopharyngeal Swab  Result Value Ref Range Status   SARS Coronavirus 2 NEGATIVE NEGATIVE Final    Comment: (NOTE) If result is NEGATIVE SARS-CoV-2 target nucleic acids are NOT DETECTED. The SARS-CoV-2 RNA is generally detectable in upper and lower  respiratory specimens during the acute phase of infection. The lowest  concentration of SARS-CoV-2 viral copies this  assay can detect is 250  copies / mL. A negative result does not preclude SARS-CoV-2 infection  and should not be used as the sole basis for treatment or other  patient management decisions.  A negative result may occur with  improper specimen collection / handling, submission of specimen other  than nasopharyngeal swab, presence of viral mutation(s) within the  areas targeted by this assay, and inadequate number of viral copies  (<250 copies / mL). A negative result must be combined with clinical  observations, patient history, and epidemiological information. If result is POSITIVE SARS-CoV-2 target nucleic acids are DETECTED. The SARS-CoV-2 RNA is generally detectable in upper and lower  respiratory specimens dur ing the acute phase of infection.  Positive  results are indicative of active infection with SARS-CoV-2.  Clinical  correlation with patient history and other  diagnostic information is  necessary to determine patient infection status.  Positive results do  not rule out bacterial infection or co-infection with other viruses. If result is PRESUMPTIVE POSTIVE SARS-CoV-2 nucleic acids MAY BE PRESENT.   A presumptive positive result was obtained on the submitted specimen  and confirmed on repeat testing.  While 2019 novel coronavirus  (SARS-CoV-2) nucleic acids may be present in the submitted sample  additional confirmatory testing may be necessary for epidemiological  and / or clinical management purposes  to differentiate between  SARS-CoV-2 and other Sarbecovirus currently known to infect humans.  If clinically indicated additional testing with an alternate test  methodology [phone]) is advised. The SARS-CoV-2 RNA is generally  detectable in upper and lower respiratory sp ecimens during the acute  phase of infection. The expected result is Negative. Fact Sheet for Patients:  StrictlyIdeas.no Fact Sheet for Healthcare Providers: BankingDealers.co.za This test is not yet approved or cleared by the [place] FDA and has been authorized for detection and/or diagnosis of SARS-CoV-2 by FDA under an Emergency Use Authorization (EUA).  This EUA will remain in effect (meaning this test can be used) for the duration of the COVID-19 declaration under Section 564(b)(1) of the Act, 21 U.S.C. section 360bbb-3(b)(1), unless the authorization is terminated or revoked sooner. Performed at [facility], [place] [address] [place], [place] [address]   MRSA PCR Screening     Status: None   Collection Time: 07/11/19  1:30 PM   Specimen: Nasal Mucosa; Nasopharyngeal  Result Value Ref Range Status   MRSA by PCR NEGATIVE NEGATIVE Final    Comment:        The GeneXpert MRSA Assay (FDA approved for NASAL specimens only), is one component of a comprehensive MRSA colonization surveillance  program. It is not intended to diagnose MRSA infection nor to guide or monitor treatment for MRSA infections. Performed at [facility], [place] [address] [place], [place] [address]      Labs: BNP (last 3 results) Recent Labs    04/01/19 0732 04/27/19 1820  BNP 55.9 88.4   Basic Metabolic Panel: Recent Labs  Lab 07/11/19 0809 07/12/19 0225  NA 142 137  K 3.7 4.5  CL 108 104  CO2 26 25  GLUCOSE 103* 139*  BUN 17 16  CREATININE 0.93 0.77  CALCIUM 9.0 8.8*   Liver Function Tests: Recent Labs  Lab 07/12/19 0225  AST 23  ALT 29  ALKPHOS 69  BILITOT 0.5  PROT 6.2*  ALBUMIN 3.4*   No results for input(s): LIPASE, AMYLASE in the last 168 hours. No results for input(s): AMMONIA in the last 168  hours. CBC: Recent Labs  Lab 07/11/19 0809 07/12/19 0225 07/14/19 0547  WBC 7.3 13.6* 16.0*  NEUTROABS 4.3  --   --   HGB 14.1 12.6 12.7  HCT 46.4* 39.6 41.3  MCV 91.5 91.0 91.0  PLT 274 265 272   Cardiac Enzymes: No results for input(s): CKTOTAL, CKMB, CKMBINDEX, TROPONINI in the last 168 hours. BNP: Invalid input(s): POCBNP CBG: No results for input(s): GLUCAP in the last 168 hours. D-Dimer No results for input(s): DDIMER in the last 72 hours. Hgb A1c No results for input(s): HGBA1C in the last 72 hours. Lipid Profile No results for input(s): CHOL, HDL, LDLCALC, TRIG, CHOLHDL, LDLDIRECT in the last 72 hours. Thyroid function studies No results for input(s): TSH, T4TOTAL, T3FREE, THYROIDAB in the last 72 hours.  Invalid input(s): FREET3 Anemia work up No results for input(s): VITAMINB12, FOLATE, FERRITIN, TIBC, IRON, RETICCTPCT in the last 72 hours. Urinalysis    Component Value Date/Time   COLORURINE YELLOW 02/27/2019 1636   APPEARANCEUR CLEAR 02/27/2019 1636   LABSPEC 1.015 02/27/2019 1636   PHURINE 7.0 02/27/2019 1636   GLUCOSEU NEGATIVE 02/27/2019 1636   GLUCOSEU NEG mg/dL 04/09/2008 2037   HGBUR NEGATIVE 02/27/2019 1636    BILIRUBINUR NEGATIVE 02/27/2019 1636   BILIRUBINUR Negative 05/24/2014 1150   KETONESUR NEGATIVE 02/27/2019 1636   PROTEINUR NEGATIVE 02/27/2019 1636   UROBILINOGEN 0.2 03/11/2015 1323   NITRITE NEGATIVE 02/27/2019 1636   LEUKOCYTESUR NEGATIVE 02/27/2019 1636   Sepsis Labs Invalid input(s): PROCALCITONIN,  WBC,  LACTICIDVEN Microbiology Recent Results (from the past 240 hour(s))  SARS Coronavirus 2 (CEPHEID- Performed in [place] hospital lab), Hosp Order     Status: None   Collection Time: 07/11/19  8:52 AM   Specimen: Nasopharyngeal Swab  Result Value Ref Range Status   SARS Coronavirus 2 NEGATIVE NEGATIVE Final    Comment: (NOTE) If result is NEGATIVE SARS-CoV-2 target nucleic acids are NOT DETECTED. The SARS-CoV-2 RNA is generally detectable in upper and lower  respiratory specimens during the acute phase of infection. The lowest  concentration of SARS-CoV-2 viral copies this assay can detect is 250  copies / mL. A negative result does not preclude SARS-CoV-2 infection  and should not be used as the sole basis for treatment or other  patient management decisions.  A negative result may occur with  improper specimen collection / handling, submission of specimen other  than nasopharyngeal swab, presence of viral mutation(s) within the  areas targeted by this assay, and inadequate number of viral copies  (<250 copies / mL). A negative result must be combined with clinical  observations, patient history, and epidemiological information. If result is POSITIVE SARS-CoV-2 target nucleic acids are DETECTED. The SARS-CoV-2 RNA is generally detectable in upper and lower  respiratory specimens dur ing the acute phase of infection.  Positive  results are indicative of active infection with SARS-CoV-2.  Clinical  correlation with patient history and other diagnostic information is  necessary to determine patient infection status.  Positive results do  not rule out bacterial infection  or co-infection with other viruses. If result is PRESUMPTIVE POSTIVE SARS-CoV-2 nucleic acids MAY BE PRESENT.   A presumptive positive result was obtained on the submitted specimen  and confirmed on repeat testing.  While 2019 novel coronavirus  (SARS-CoV-2) nucleic acids may be present in the submitted sample  additional confirmatory testing may be necessary for epidemiological  and / or clinical management purposes  to differentiate between  SARS-CoV-2 and other Sarbecovirus currently known  to infect humans.  If clinically indicated additional testing with an alternate test  methodology [phone]) is advised. The SARS-CoV-2 RNA is generally  detectable in upper and lower respiratory sp ecimens during the acute  phase of infection. The expected result is Negative. Fact Sheet for Patients:  StrictlyIdeas.no Fact Sheet for Healthcare Providers: BankingDealers.co.za This test is not yet approved or cleared by the [place] FDA and has been authorized for detection and/or diagnosis of SARS-CoV-2 by FDA under an Emergency Use Authorization (EUA).  This EUA will remain in effect (meaning this test can be used) for the duration of the COVID-19 declaration under Section 564(b)(1) of the Act, 21 U.S.C. section 360bbb-3(b)(1), unless the authorization is terminated or revoked sooner. Performed at [facility], [place] [address], [place], [place] [address]   MRSA PCR Screening     Status: None   Collection Time: 07/11/19  1:30 PM   Specimen: Nasal Mucosa; Nasopharyngeal  Result Value Ref Range Status   MRSA by PCR NEGATIVE NEGATIVE Final    Comment:        The GeneXpert MRSA Assay (FDA approved for NASAL specimens only), is one component of a comprehensive MRSA colonization surveillance program. It is not intended to diagnose MRSA infection nor to guide or monitor treatment for MRSA infections. Performed at [facility], [place] [address], [place], [place] [address]    Time spent: 30 min  SIGNED:   Marylu Lund, MD  [facility] 07/15/2019, 1:35 PM  If 7PM-7AM, please contact night-coverage

## 2019-07-15 NOTE — Plan of Care (Signed)
Expiratory wheezing ausculted, not clearing with cough. OOB independent in room, c/o chronic back pain

## 2019-07-17 ENCOUNTER — Telehealth: Payer: Self-pay | Admitting: Pharmacist

## 2019-07-17 ENCOUNTER — Other Ambulatory Visit: Payer: Self-pay | Admitting: Pharmacist

## 2019-07-17 DIAGNOSIS — K219 Gastro-esophageal reflux disease without esophagitis: Secondary | ICD-10-CM

## 2019-07-17 DIAGNOSIS — J4541 Moderate persistent asthma with (acute) exacerbation: Secondary | ICD-10-CM

## 2019-07-17 MED ORDER — METOCLOPRAMIDE HCL 5 MG PO TABS: 5.0000 mg | ORAL_TABLET | Freq: Three times a day (TID) | ORAL | 0 refills | Status: DC

## 2019-07-17 NOTE — Progress Notes (Signed)
Contacted patient to follow up, patient requests help with medication access, appointment scheduled for 8/6 for [facility] (post hospital discharge) and pharmacy visit.

## 2019-07-19 ENCOUNTER — Encounter: Payer: Self-pay | Admitting: Internal Medicine

## 2019-07-19 ENCOUNTER — Ambulatory Visit: Payer: Self-pay

## 2019-07-19 ENCOUNTER — Other Ambulatory Visit: Payer: Self-pay

## 2019-07-19 ENCOUNTER — Ambulatory Visit (INDEPENDENT_AMBULATORY_CARE_PROVIDER_SITE_OTHER): Payer: Self-pay | Admitting: Internal Medicine

## 2019-07-19 VITALS — BP 110/71 | HR 83 | Temp 99.4°F | Ht 63.0 in | Wt 152.2 lb

## 2019-07-19 DIAGNOSIS — Z720 Tobacco use: Secondary | ICD-10-CM

## 2019-07-19 DIAGNOSIS — J441 Chronic obstructive pulmonary disease with (acute) exacerbation: Secondary | ICD-10-CM

## 2019-07-19 DIAGNOSIS — K219 Gastro-esophageal reflux disease without esophagitis: Secondary | ICD-10-CM

## 2019-07-19 DIAGNOSIS — J449 Chronic obstructive pulmonary disease, unspecified: Secondary | ICD-10-CM

## 2019-07-19 DIAGNOSIS — G43809 Other migraine, not intractable, without status migrainosus: Secondary | ICD-10-CM

## 2019-07-19 DIAGNOSIS — Z79899 Other long term (current) drug therapy: Secondary | ICD-10-CM

## 2019-07-19 DIAGNOSIS — Z7951 Long term (current) use of inhaled steroids: Secondary | ICD-10-CM

## 2019-07-19 MED ORDER — PROPRANOLOL HCL 40 MG PO TABS: 40.0000 mg | ORAL_TABLET | Freq: Every day | ORAL | 1 refills | Status: DC

## 2019-07-19 MED FILL — PROPRANOLOL 40 MG TABLET: 40 | 90 days supply | Qty: 90 | Fill #0

## 2019-07-19 NOTE — Progress Notes (Signed)
   CC: F/u GERD, COPD, and Migraines  HPI:  Ms.Geroldine F Cartwright is a 58 y.o. female with PMHx listed below presenting for F/u GERD, COPD, and Migraines. Please see the A&P for the status of the patient's chronic medical problems.  Past Medical History:  Diagnosis Date  . Asthma    2 sets of PFT's in 04/09 without sign variability. Last set with significant decrease in FEV1 with saline alone, suggesting Asthma but  recommended clinical corelation   . Asthma   . Bipolar 1 disorder [facility])    therapist is Caryl Asp and is followed by [employer]  . Blackout    negative work up including ESR, ANA, opthalmology referral, carotid dopplers, 2D echo, MRI and EEG.  . BREAST LUMP 03/25/2008   Annotation: bilaterally Qualifier: Diagnosis of  By: Tomasa Hosteller MD, Edmon Crape.   . 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 Bronchitis   . Chronic headache   . Chronic low back pain 08/08/2008   Qualifier: Diagnosis of  By: Redmond Pulling  MD, Mateo Flow    . Chronic pain    normal work up including TSH, RPR, B12, HIV, plain films, 2 ESR's, ANA, CK, RF along with routine CBC, CMET and UA. Further work up includes CRP, ESR, SPEP/UPEP, hepatitis erology, A1C , repeat ANA  . COPD (chronic obstructive pulmonary disease) ([place])   . COPD exacerbation ([place]) 03/31/2019  . DUB (dysfunctional uterine bleeding)    and pelvic pain, with negative endometrial bx in 07/09 and transvaginal U/S significant for mild fibroids in 0/09.  Marland Kitchen GERD (gastroesophageal reflux disease)   . Hallucinations 03/18/2008   Qualifier: Diagnosis of  By: Redmond Pulling  MD, Mateo Flow    . Lower extremity edema    Neg ABI's, normal 2D echo, normal albumin  . Menorrhagia   . Ovarian cyst   . Polysubstance abuse ([place])    none since March 17,2009.  Marland Kitchen Sleep apnea    NO CPAP  . Tubular adenoma of colon    Review of Systems:  Performed and all others negative.  Physical Exam: Vitals:   07/19/19 1522 07/19/19 1525  BP:   110/71  Pulse:  83  Temp:  99.4 F (37.4 C)  TempSrc:  Oral  SpO2:  99%  Weight: 152 lb 3.2 oz (69 kg)   Height: '5\' 3"'$  (1.6 m)    General: Well nourished female in no acute distress Pulm: Good air movement with no wheezing or crackles  CV: RRR, no murmurs, no rubs   Assessment & Plan:   See Encounters Tab for problem based charting.  Patient discussed with Dr. Evette Doffing

## 2019-07-19 NOTE — Patient Instructions (Addendum)
Thank you for allowing me to provide your care. Today we are doing the following.   1) For your COPD continue your inhalers as prescribed. I am going to order some tests to evaluate the function of your lungs. It is very important that you complete these.   2) For your acid reflux, continue the protonix 40 mg twice daily and the reglan. I will refer you to GI for further evaluation.   3) For you migraines we are starting a medication called Propanolol. Please take this for a couple weeks and let me know if you migraines do not improve.   Come back to see me in 3 months or sooner if any issues arise.

## 2019-07-20 NOTE — Assessment & Plan Note (Signed)
HPI:  Patient with uncontrolled Asthma vs COPD. She has never had PFTs. She has been hospitalized 3-4 times in the past 2-3 months. She brought in some inhalers today but they are very old. She is prescribed Spiriva, Dulera, and albuterol. She is currently smoking 0-1 cigarettes per day.   A/P: - Refill Spiriva, Dulera, and albuterol. She is working with Dr. Maudie Mercury to get her medications.  - Ordered PFTs

## 2019-07-20 NOTE — Progress Notes (Signed)
Internal Medicine Clinic Attending  Case discussed with Dr. Helberg at the time of the visit.  We reviewed the resident's history and exam and pertinent patient test results.  I agree with the assessment, diagnosis, and plan of care documented in the resident's note.    

## 2019-07-20 NOTE — Assessment & Plan Note (Signed)
HPI:  Patient with recurrent migraines. She reports having 2 to 3 per week. She is prescribed sumitriptan for acute abortion. She reports her headaches that are left sided and throbbing. Associated symptoms include N/V, photophobia, and phonophobia. She denies the excessive use of NSAIDs.   A/P: - Start prophylactic propranolol . Discussed goal is 50% reduction. If this does not work will try Topiramate

## 2019-07-20 NOTE — Assessment & Plan Note (Signed)
HPI:  Patient recently hospitalized. She voiced concerns about metallic taste in her mouth, chest pain, and dysphagia. During her recent hospitalization she had a DG esophagus that illustrated some esophageal dysmotility.   A/P: - Continue protonix and reglan  - Referral to GI

## 2019-07-23 ENCOUNTER — Ambulatory Visit: Payer: Self-pay | Admitting: Pharmacist

## 2019-07-24 ENCOUNTER — Ambulatory Visit (INDEPENDENT_AMBULATORY_CARE_PROVIDER_SITE_OTHER): Payer: Self-pay | Admitting: Internal Medicine

## 2019-07-24 ENCOUNTER — Encounter: Payer: Self-pay | Admitting: Internal Medicine

## 2019-07-24 ENCOUNTER — Telehealth: Payer: Self-pay | Admitting: *Deleted

## 2019-07-24 DIAGNOSIS — F319 Bipolar disorder, unspecified: Secondary | ICD-10-CM

## 2019-07-24 DIAGNOSIS — J449 Chronic obstructive pulmonary disease, unspecified: Secondary | ICD-10-CM

## 2019-07-24 DIAGNOSIS — G43809 Other migraine, not intractable, without status migrainosus: Secondary | ICD-10-CM

## 2019-07-24 DIAGNOSIS — F3160 Bipolar disorder, current episode mixed, unspecified: Secondary | ICD-10-CM

## 2019-07-24 MED ORDER — SUMATRIPTAN SUCCINATE 25 MG PO TABS: 25.0000 mg | ORAL_TABLET | ORAL | 0 refills | Status: DC | PRN

## 2019-07-24 MED ORDER — PROPRANOLOL HCL 40 MG PO TABS: 40.0000 mg | ORAL_TABLET | Freq: Every day | ORAL | 1 refills | Status: DC

## 2019-07-24 MED ORDER — SERTRALINE HCL 100 MG PO TABS: 100.0000 mg | ORAL_TABLET | Freq: Two times a day (BID) | ORAL | 0 refills | Status: DC

## 2019-07-24 MED FILL — SERTRALINE HCL 100 MG TAB: 100 | 30 days supply | Qty: 60 | Fill #0

## 2019-07-24 MED FILL — SUMATRIPTAN SUCC 25 MG TAB: 25 | 30 days supply | Qty: 10 | Fill #0

## 2019-07-24 NOTE — Progress Notes (Signed)
CC: migraines   HPI:  [name] is a 58 y.o. female with PMHx listed below who presents for acute migraine. Please see problem based charting for further details.   Past Medical History:  Diagnosis Date  . Asthma    2 sets of PFT's in 04/09 without sign variability. Last set with significant decrease in FEV1 with saline alone, suggesting Asthma but  recommended clinical corelation   . Asthma   . Bipolar 1 disorder [facility])    therapist is Caryl Asp and is followed by [facility]  . Blackout    negative work up including ESR, ANA, opthalmology referral, carotid dopplers, 2D echo, MRI and EEG.  . BREAST LUMP 03/25/2008   Annotation: bilaterally Qualifier: Diagnosis of  By: Tomasa Hosteller MD, Edmon Crape.   . 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 Bronchitis   . Chronic headache   . Chronic low back pain 08/08/2008   Qualifier: Diagnosis of  By: Redmond Pulling  MD, Mateo Flow    . Chronic pain    normal work up including TSH, RPR, B12, HIV, plain films, 2 ESR's, ANA, CK, RF along with routine CBC, CMET and UA. Further work up includes CRP, ESR, SPEP/UPEP, hepatitis erology, A1C , repeat ANA  . COPD (chronic obstructive pulmonary disease) ([place])   . COPD exacerbation ([place]) 03/31/2019  . DUB (dysfunctional uterine bleeding)    and pelvic pain, with negative endometrial bx in 07/09 and transvaginal U/S significant for mild fibroids in 0/09.  Marland Kitchen GERD (gastroesophageal reflux disease)   . Hallucinations 03/18/2008   Qualifier: Diagnosis of  By: Redmond Pulling  MD, Mateo Flow    . Lower extremity edema    Neg ABI's, normal 2D echo, normal albumin  . Menorrhagia   . Ovarian cyst   . Polysubstance abuse ([place])    none since March 17,2009.  Marland Kitchen Sleep apnea    NO CPAP  . Tubular adenoma of colon    Review of Systems:  Review of Systems  Constitutional: Negative for chills and fever.  Eyes: Positive for photophobia. Negative for double vision.  Respiratory:  Negative for shortness of breath.   Cardiovascular: Negative for chest pain and palpitations.  Neurological: Positive for dizziness, tingling and headaches. Negative for focal weakness and loss of consciousness.     Physical Exam:  Vitals:   07/24/19 0929  BP: (!) 129/96  Pulse: 79  Temp: 98.1 F (36.7 C)  TempSrc: Oral  Weight: 147 lb 11.2 oz (67 kg)   Physical Exam Constitutional:      General: She is not in acute distress.    Appearance: Normal appearance.  Eyes:     Conjunctiva/sclera: Conjunctivae normal.     Pupils: Pupils are equal, round, and reactive to light.  Cardiovascular:     Rate and Rhythm: Normal rate and regular rhythm.  Pulmonary:     Effort: Pulmonary effort is normal.     Breath sounds: Normal breath sounds.  Abdominal:     Palpations: Abdomen is soft.     Tenderness: There is no abdominal tenderness.  Musculoskeletal:     Right lower leg: No edema.     Left lower leg: No edema.  Neurological:     Mental Status: She is alert and oriented to person, place, and time.     Sensory: Sensation is intact.     Motor: Motor function is intact.     Coordination: Heel to [employer]  normal.     Gait: Gait is intact.     Comments: Cranial nerve exam with altered sensation to all three branches of trigeminal nerve. Otherwise, intact.   Psychiatric:        Mood and Affect: Mood normal.        Behavior: Behavior normal.      Assessment & Plan:   See Encounters Tab for problem based charting.  Patient discussed with Dr. Lynnae January

## 2019-07-24 NOTE — Assessment & Plan Note (Signed)
Patient presents for uncontrolled migraines since recent discharge for COPD exacerbation. Headaches are similar to previous. Describes them as left-sided and throbbing with dysesthesia to left side of face, photophobia, n/v, phonophobia. She was prescribed Propranolol at office visit on 8/7, but has not been able to take this due to issues with her pharmacy. She was prescribed Imitrex prn at discharge. She took a pill yesterday after going home from work early with a migraine which relieved her symptoms.  Spent time educating her on prophylactic vs abortive therapy. She understands to take the propranolol every night even if she does not have a headache and only use the Imitrex when she has symptoms of a migraine.

## 2019-07-24 NOTE — Patient Instructions (Addendum)
[name], It was nice seeing you.  Today we discussed your migraine headaches. I have sent in a new prescription to the [facility] for 2 medications. The Propranolol you will take every night as a preventative medication. You can try Excedrine migraine which can be purchased over the counter. If this doesn't work, The Sumatriptan (Imitrex) is to take as soon as you feel a migraine come on. It is normal for this to make you tired and is best to lie down in a dark room after taking it.   Your Bipolar: I'd like you to call the below number for an appointment with [facility] as soon as possible. They also take walk-ins. It is really important that you get stabilized on medications. I have sent in the medication you've been taking into the [facility] until you can be seen.   Please follow-up with your PCP in 2-3 weeks to see how you are doing.   Take care,  Dr. Caffie Pinto: [phone] Physical Address: [address] [place],  [address]

## 2019-07-24 NOTE — Telephone Encounter (Signed)
Walk-in to the office. Pt c/o migraines. Stated she was in [facility] hospital for heat stroke. Now, the migraines are severe; unable to take sumatriptan at work b/c"knocks me out"; also having nausea. And sometimes unable to see (aura) d/t migraines. And cannot remember sometimes. [facility] appt given for this am @ 1015 AM - Kaye informed.

## 2019-07-24 NOTE — Assessment & Plan Note (Signed)
This is likely contributing to her issues with medication adherence. Today she feels like she is "going crazy." She is not currently on any mood stabilizers which she would likely benefit from. She is not actively suicidal or demonstrating symptoms to warrant hospitalization. I have written her a note to remain out of work for the next 2 weeks to allow her to be evaluated by [facility] and hopefully get stabilized on psychiatric medications.

## 2019-07-27 ENCOUNTER — Encounter: Payer: Self-pay | Admitting: *Deleted

## 2019-07-27 ENCOUNTER — Telehealth: Payer: Self-pay | Admitting: *Deleted

## 2019-07-27 NOTE — Telephone Encounter (Signed)
LVM FOR PATIENT TO RETURN CALL TO CLINIC.

## 2019-07-27 NOTE — Progress Notes (Signed)
Internal Medicine Clinic Attending  Case discussed with Dr. Bloomfield at the time of the visit.  We reviewed the resident's history and exam and pertinent patient test results.  I agree with the assessment, diagnosis, and plan of care documented in the resident's note.  

## 2019-07-31 MED ORDER — EPINEPHRINE 0.3 MG/0.3ML IJ SOAJ: 0.3000 mg | INTRAMUSCULAR | 1 refills | Status: DC | PRN

## 2019-07-31 NOTE — Addendum Note (Signed)
Addended by: Forde Dandy on: 07/31/2019 10:54 AM   Modules accepted: Orders

## 2019-08-01 MED ORDER — EPINEPHRINE 0.3 MG/0.3ML IJ SOAJ: 0.3000 mg | INTRAMUSCULAR | 1 refills | Status: DC | PRN

## 2019-08-01 NOTE — Addendum Note (Signed)
Addended by: Forde Dandy on: 08/01/2019 12:43 PM   Modules accepted: Orders

## 2019-08-01 NOTE — Addendum Note (Signed)
Addended by: Forde Dandy on: 08/01/2019 10:12 AM   Modules accepted: Orders

## 2019-08-03 ENCOUNTER — Telehealth: Payer: Self-pay

## 2019-08-03 NOTE — Telephone Encounter (Signed)
Called [employer] Patient Assistance [employer] to get status of application. Associate said it should get official approval on Monday and approval letter and order for shipment will be placed then.

## 2019-08-09 ENCOUNTER — Other Ambulatory Visit: Payer: Self-pay

## 2019-08-09 ENCOUNTER — Encounter: Payer: Self-pay | Admitting: Internal Medicine

## 2019-08-09 ENCOUNTER — Telehealth: Payer: Self-pay

## 2019-08-09 ENCOUNTER — Ambulatory Visit (INDEPENDENT_AMBULATORY_CARE_PROVIDER_SITE_OTHER): Payer: Self-pay | Admitting: Internal Medicine

## 2019-08-09 VITALS — BP 102/57 | HR 88 | Temp 98.2°F | Ht 63.0 in | Wt 148.5 lb

## 2019-08-09 DIAGNOSIS — G43809 Other migraine, not intractable, without status migrainosus: Secondary | ICD-10-CM

## 2019-08-09 DIAGNOSIS — F3160 Bipolar disorder, current episode mixed, unspecified: Secondary | ICD-10-CM

## 2019-08-09 DIAGNOSIS — G43909 Migraine, unspecified, not intractable, without status migrainosus: Secondary | ICD-10-CM

## 2019-08-09 DIAGNOSIS — F3110 Bipolar disorder, current episode manic without psychotic features, unspecified: Secondary | ICD-10-CM

## 2019-08-09 DIAGNOSIS — J4541 Moderate persistent asthma with (acute) exacerbation: Secondary | ICD-10-CM

## 2019-08-09 DIAGNOSIS — Z79899 Other long term (current) drug therapy: Secondary | ICD-10-CM

## 2019-08-09 MED ORDER — DIVALPROEX SODIUM ER 500 MG PO TB24: 500.0000 mg | ORAL_TABLET | Freq: Two times a day (BID) | ORAL | 0 refills | Status: DC

## 2019-08-09 NOTE — Patient Instructions (Addendum)
Thank you for allowing me to provide your care.   Today we are starting you back on Depakote for your bipolar and your migraines. I am also referring you to psychiatry. You need to follow up with them for continued medications management.   STOP the propanolol.   I will see you back in 3 months or sooner if anything arises.

## 2019-08-09 NOTE — Telephone Encounter (Signed)
Called [employer] for update on Epipen application. Told by patient assistance specialist that the case is being reviewed by the legal department and to f/u early next week.   Brenton Grills, Student-PharmD 08/09/2019 10:16 AM

## 2019-08-09 NOTE — Progress Notes (Signed)
   CC: F/u migraines  HPI:  [name] is a 58 y.o. female with PMHx listed below presenting for migraines. Please see the A&P for the status of the patient's chronic medical problems.  Past Medical History:  Diagnosis Date  . Asthma    2 sets of PFT's in 04/09 without sign variability. Last set with significant decrease in FEV1 with saline alone, suggesting Asthma but  recommended clinical corelation   . Asthma   . Bipolar 1 disorder [facility])    therapist is Caryl Asp and is followed by [facility]  . Blackout    negative work up including ESR, ANA, opthalmology referral, carotid dopplers, 2D echo, MRI and EEG.  . BREAST LUMP 03/25/2008   Annotation: bilaterally Qualifier: Diagnosis of  By: Tomasa Hosteller MD, Edmon Crape.   . 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 Bronchitis   . Chronic headache   . Chronic low back pain 08/08/2008   Qualifier: Diagnosis of  By: Redmond Pulling  MD, Mateo Flow    . Chronic pain    normal work up including TSH, RPR, B12, HIV, plain films, 2 ESR's, ANA, CK, RF along with routine CBC, CMET and UA. Further work up includes CRP, ESR, SPEP/UPEP, hepatitis erology, A1C , repeat ANA  . COPD (chronic obstructive pulmonary disease) ([place])   . COPD exacerbation ([place]) 03/31/2019  . DUB (dysfunctional uterine bleeding)    and pelvic pain, with negative endometrial bx in 07/09 and transvaginal U/S significant for mild fibroids in 0/09.  Marland Kitchen GERD (gastroesophageal reflux disease)   . Hallucinations 03/18/2008   Qualifier: Diagnosis of  By: Redmond Pulling  MD, Mateo Flow    . Lower extremity edema    Neg ABI's, normal 2D echo, normal albumin  . Menorrhagia   . Ovarian cyst   . Polysubstance abuse ([place])    none since March 17,2009.  Marland Kitchen Sleep apnea    NO CPAP  . 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 Tubular adenoma of colon    Review of Systems:  Performed and all others negative.  Physical Exam: Vitals:   08/09/19 1454 08/09/19 1458   BP:  (!) 102/57  Pulse:  88  Temp:  98.2 F (36.8 C)  TempSrc:  Oral  SpO2:  100%  Weight: 148 lb 8 oz (67.4 kg)   Height: '5\' 3"'$  (1.6 m)    General: Well nourished female in no acute distress Pulm: Good air movement with no wheezing or crackles  CV: RRR, no murmurs, no rubs   Assessment & Plan:   See Encounters Tab for problem based charting.  Patient discussed with Dr. Dareen Piano

## 2019-08-10 LAB — CBC WITH DIFFERENTIAL/PLATELET
Basophils Absolute: 0.1 10*3/uL (ref 0.0–0.2)
Basos: 1 %
EOS (ABSOLUTE): 0.2 10*3/uL (ref 0.0–0.4)
Eos: 3 %
Hematocrit: 41.5 % (ref 34.0–46.6)
Hemoglobin: 13.6 g/dL (ref 11.1–15.9)
Immature Grans (Abs): 0 10*3/uL (ref 0.0–0.1)
Immature Granulocytes: 1 %
Lymphocytes Absolute: 2.9 10*3/uL (ref 0.7–3.1)
Lymphs: 41 %
MCH: 28.2 pg (ref 26.6–33.0)
MCHC: 32.8 g/dL (ref 31.5–35.7)
MCV: 86 fL (ref 79–97)
Monocytes Absolute: 0.7 10*3/uL (ref 0.1–0.9)
Monocytes: 10 %
Neutrophils Absolute: 3.2 10*3/uL (ref 1.4–7.0)
Neutrophils: 44 %
Platelets: 355 10*3/uL (ref 150–450)
RBC: 4.83 x10E6/uL (ref 3.77–5.28)
RDW: 12.5 % (ref 11.7–15.4)
WBC: 7.2 10*3/uL (ref 3.4–10.8)

## 2019-08-10 LAB — CMP14 + ANION GAP
ALT: 31 IU/L (ref 0–32)
AST: 23 IU/L (ref 0–40)
Albumin/Globulin Ratio: 2 (ref 1.2–2.2)
Albumin: 4.1 g/dL (ref 3.8–4.9)
Alkaline Phosphatase: 89 IU/L (ref 39–117)
Anion Gap: 16 mmol/L (ref 10.0–18.0)
BUN/Creatinine Ratio: 12 (ref 9–23)
BUN: 14 mg/dL (ref 6–24)
Bilirubin Total: <0.2 mg/dL (ref 0.0–1.2)
CO2: 23 mmol/L (ref 20–29)
Calcium: 9.2 mg/dL (ref 8.7–10.2)
Chloride: 102 mmol/L (ref 96–106)
Creatinine, Ser: 1.18 mg/dL — ABNORMAL HIGH (ref 0.57–1.00)
GFR calc Af Amer: 59 mL/min/{1.73_m2} — ABNORMAL LOW (ref >59)
GFR calc non Af Amer: 51 mL/min/{1.73_m2} — ABNORMAL LOW (ref >59)
Globulin, Total: 2.1 g/dL (ref 1.5–4.5)
Glucose: 83 mg/dL (ref 65–99)
Potassium: 4.3 mmol/L (ref 3.5–5.2)
Sodium: 141 mmol/L (ref 134–144)
Total Protein: 6.2 g/dL (ref 6.0–8.5)

## 2019-08-11 NOTE — Assessment & Plan Note (Signed)
HPI:  Patient with history of recurrent migraines. States that she gets them almost daily. Sometimes they include visual blackouts. At our last visit she was started on propranolol for prophylactic treatment. She states that she tried it 1 to 2 times didn't like it so she is not continued. She is asking about alternative medications because her migraines make her miss work.  A/P: - Started Depakote (see Bipolar) which can help with migraines.

## 2019-08-11 NOTE — Assessment & Plan Note (Signed)
HPI:  Patient with known bipolar. She [facility] followed with [facility] however is not been back to see them in several years. She has been hospitalized in the past for bipolar. She was previously on Depakote ER 500 mg BID from 2011 to 2016. In 2016 it looks that it was stopped during one of her hospitalizations and since that time she has had recurrent manic episodes. She is currently taking unopposed sertraline.  A/P: - We discussed restarting her Depakote and getting her plugged in with psychiatry. She is very much interested in this. Will obtain baseline LFTs and CBC.

## 2019-08-12 NOTE — Progress Notes (Signed)
Internal Medicine Clinic Attending  Case discussed with Dr. Helberg at the time of the visit.  We reviewed the resident's history and exam and pertinent patient test results.  I agree with the assessment, diagnosis, and plan of care documented in the resident's note.    

## 2019-08-13 NOTE — Telephone Encounter (Signed)
Epipen application still in legal department. [employer] having meeting later this afternoon about the situation, will f/u tomorrow.   Brenton Grills, Student-PharmD 08/13/2019 10:41 AM

## 2019-08-16 NOTE — Telephone Encounter (Signed)
Application still pending in [place] legal dept. Advised to f/u 08/21/19.   Brenton Grills, Student-PharmD 08/16/2019 11:25 AM

## 2019-08-23 ENCOUNTER — Other Ambulatory Visit: Payer: Self-pay

## 2019-08-23 ENCOUNTER — Emergency Department (HOSPITAL_COMMUNITY)
Admission: EM | Admit: 2019-08-23 | Discharge: 2019-08-23 | Disposition: A | Discharge status: Home / Self Care | Payer: Self-pay | Attending: Emergency Medicine | Admitting: Emergency Medicine

## 2019-08-23 ENCOUNTER — Encounter (HOSPITAL_COMMUNITY): Payer: Self-pay | Admitting: Emergency Medicine

## 2019-08-23 DIAGNOSIS — F17210 Nicotine dependence, cigarettes, uncomplicated: Secondary | ICD-10-CM | POA: Insufficient documentation

## 2019-08-23 DIAGNOSIS — Z7982 Long term (current) use of aspirin: Secondary | ICD-10-CM | POA: Insufficient documentation

## 2019-08-23 DIAGNOSIS — J45901 Unspecified asthma with (acute) exacerbation: Secondary | ICD-10-CM | POA: Insufficient documentation

## 2019-08-23 DIAGNOSIS — Z79899 Other long term (current) drug therapy: Secondary | ICD-10-CM | POA: Insufficient documentation

## 2019-08-23 MED ORDER — ALBUTEROL SULFATE HFA 108 (90 BASE) MCG/ACT IN AERS
2.0000 | INHALATION_SPRAY | Freq: Once | RESPIRATORY_TRACT | Status: AC
  Administered 2019-08-23: 2 via RESPIRATORY_TRACT
  Filled 2019-08-23: qty 6.7

## 2019-08-23 MED ORDER — PREDNISONE 10 MG PO TABS: 20.0000 mg | ORAL_TABLET | Freq: Every day | ORAL | 0 refills | Status: DC

## 2019-08-23 MED ORDER — ALBUTEROL SULFATE (2.5 MG/3ML) 0.083% IN NEBU: 2.5000 mg | INHALATION_SOLUTION | Freq: Four times a day (QID) | RESPIRATORY_TRACT | 12 refills | Status: DC | PRN

## 2019-08-23 MED ORDER — PREDNISONE 20 MG PO TABS
60.0000 mg | ORAL_TABLET | Freq: Once | ORAL | Status: AC
  Administered 2019-08-23: 60 mg via ORAL
  Filled 2019-08-23: qty 3

## 2019-08-23 MED ORDER — AZITHROMYCIN 250 MG PO TABS: 250.0000 mg | ORAL_TABLET | Freq: Every day | ORAL | 0 refills | Status: DC

## 2019-08-23 MED ORDER — ALBUTEROL SULFATE HFA 108 (90 BASE) MCG/ACT IN AERS: 1.0000 | INHALATION_SPRAY | Freq: Four times a day (QID) | RESPIRATORY_TRACT | 0 refills | Status: DC | PRN

## 2019-08-23 NOTE — Discharge Instructions (Signed)
Please return for any problem.  Follow-up with your care provider as instructed.

## 2019-08-23 NOTE — ED Provider Notes (Signed)
[facility] DEPT Provider Note   CSN: [id number] Arrival date & time: 08/23/19  1700     History   Chief Complaint Chief Complaint  Patient presents with  . Medication Refill    HPI ANGLE DIRUSSO is a 57 y.o. female.     58 year old female with prior medical history as detailed below presents for evaluation of wheezing.  Patient reports longstanding history of asthma.  She reports that she is out of her albuterol.  She ran out 2 days prior.  She presents today complaining of persistent wheezing.  She denies associated fever, cough, or significant dyspnea.  She is not currently on prednisone.  She is not taking antibiotics.  She denies known exposure to COVID positive patient.  The history is provided by the patient and medical records.  Illness Location:  Wheezing, out of albuterol  Severity:  Mild Onset quality:  Gradual Duration:  2 days Timing:  Constant Progression:  Worsening Chronicity:  New Associated symptoms: no chest pain, no fever and no shortness of breath     Past Medical History:  Diagnosis Date  . Asthma    2 sets of PFT's in 04/09 without sign variability. Last set with significant decrease in FEV1 with saline alone, suggesting Asthma but  recommended clinical corelation   . Asthma   . Bipolar 1 disorder [facility])    therapist is Caryl Asp and is followed by [facility]  . Blackout    negative work up including ESR, ANA, opthalmology referral, carotid dopplers, 2D echo, MRI and EEG.  . BREAST LUMP 03/25/2008   Annotation: bilaterally Qualifier: Diagnosis of  By: Tomasa Hosteller MD, Edmon Crape.   . 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 Bronchitis   . Chronic headache   . Chronic low back pain 08/08/2008   Qualifier: Diagnosis of  By: Redmond Pulling  MD, Mateo Flow    . Chronic pain    normal work up including TSH, RPR, B12, HIV, plain films, 2 ESR's, ANA, CK, RF along with routine CBC, CMET  and UA. Further work up includes CRP, ESR, SPEP/UPEP, hepatitis erology, A1C , repeat ANA  . COPD (chronic obstructive pulmonary disease) ([place])   . COPD exacerbation ([place]) 03/31/2019  . DUB (dysfunctional uterine bleeding)    and pelvic pain, with negative endometrial bx in 07/09 and transvaginal U/S significant for mild fibroids in 0/09.  Marland Kitchen GERD (gastroesophageal reflux disease)   . Hallucinations 03/18/2008   Qualifier: Diagnosis of  By: Redmond Pulling  MD, Mateo Flow    . Lower extremity edema    Neg ABI's, normal 2D echo, normal albumin  . Menorrhagia   . Ovarian cyst   . Polysubstance abuse ([place])    none since March 17,2009.  Marland Kitchen Sleep apnea    NO CPAP  . 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 Tubular adenoma of colon     Patient Active Problem List   Diagnosis Date Noted  . COPD (chronic obstructive pulmonary disease) ([place]) 06/08/2019  . Daytime somnolence 04/06/2019  . Numbness and tingling in right hand 03/09/2019  . Lumbar radiculopathy 07/26/2016  . Depression 09/19/2015  . Preventative health care 06/18/2014  . Asthma 05/13/2014  . BIPOLAR DISORDER UNSPECIFIED 01/21/2010  . GERD 01/21/2010  . Generalized anxiety disorder 03/14/2008  . DRUG ABUSE 03/14/2008  . Migraine variant 03/14/2008    Past Surgical History:  Procedure Laterality Date  . CESAREAN SECTION    .  CESAREAN SECTION    . HERNIA REPAIR    . Left partial oophorectomy    . OOPHORECTOMY     1/2 ovary removed  . VAGINA SURGERY     mesh     OB History    Gravida  3   Para  2   Term  2   Preterm  0   AB  1   Living  2     SAB  0   TAB  1   Ectopic  0   Multiple      Live Births  2            Home Medications    Prior to Admission medications   Medication Sig Start Date End Date Taking? Authorizing Provider  albuterol (VENTOLIN HFA) 108 (90 Base) MCG/ACT inhaler Inhale 2 puffs into the lungs every 4 (four) hours as needed for wheezing or shortness of breath. 04/16/19   Aldine Contes, MD  aspirin EC 81 MG tablet Take 81 mg by mouth daily.    [provider]  divalproex (DEPAKOTE ER) 500 MG 24 hr tablet Take 1 tablet (500 mg total) by mouth 2 (two) times daily. 08/09/19   [name], MD  EPINEPHrine 0.3 mg/0.3 mL IJ SOAJ injection Inject 0.3 mLs (0.3 mg total) into the muscle as needed for anaphylaxis. 08/01/19   Forde Dandy, PharmD  fluticasone (FLONASE) 50 MCG/ACT nasal spray Place 1 spray into both nostrils daily. Patient not taking: Reported on 07/11/2019 04/26/19 04/25/20  Forde Dandy, PharmD  ipratropium-albuterol (DUONEB) 0.5-2.5 (3) MG/3ML SOLN Take 3 mLs by nebulization every 6 (six) hours as needed (wheezing or shortness of breath). 06/09/19   Spongberg, Audie Pinto, MD  lidocaine (LIDODERM) 5 % Place 1 patch onto the skin daily. Remove & Discard patch within 12 hours or as directed by MD 05/22/19   Neva Seat, MD  loratadine (CLARITIN) 10 MG tablet Take 1 tablet (10 mg total) by mouth daily. 04/26/19   Forde Dandy, PharmD  metoCLOPramide (REGLAN) 5 MG tablet Take 1 tablet (5 mg total) by mouth 3 (three) times daily before meals for 5 days. 07/17/19 07/22/19  [name], MD  mometasone-formoterol (DULERA) 200-5 MCG/ACT AERO Inhale 2 puffs into the lungs 2 (two) times daily. 04/26/19   Sid Falcon, MD  montelukast (SINGULAIR) 10 MG tablet Take 1 tablet (10 mg total) by mouth at bedtime. 04/26/19   Sid Falcon, MD  nicotine (NICODERM CQ - DOSED IN MG/24 HOURS) 14 mg/24hr patch Place 1 patch (14 mg total) onto the skin daily. 07/16/19   [name], MD  oxyCODONE-acetaminophen (PERCOCET/ROXICET) 5-325 MG tablet Take 1 tablet by mouth every 6 (six) hours as needed for severe pain.    [provider]  pantoprazole (PROTONIX) 40 MG tablet Take 1 tablet (40 mg total) by mouth 2 (two) times daily for 5 days. 07/15/19 07/20/19  [name], MD  sertraline (ZOLOFT) 100 MG tablet Take 1 tablet (100 mg total) by mouth 2 (two) times  daily. 07/24/19 08/23/19  Bloomfield, Nila Nephew D, DO  SUMAtriptan (IMITREX) 25 MG tablet Take 1 tablet (25 mg total) by mouth every 2 (two) hours as needed for migraine or headache. 07/24/19   Bloomfield, Carley D, DO  Tiotropium Bromide Monohydrate (SPIRIVA RESPIMAT) 2.5 MCG/ACT AERS Inhale 2 puffs into the lungs daily. 05/08/19   [name], MD    Family History Family History  Problem Relation Age of Onset  .  Colon cancer Mother 33  . Breast cancer Mother 35  . Diabetes Father   . Hypertension Father   . Kidney disease Father   . Colon cancer Father 55  . Prostate cancer Father   . Cancer Father   . Kidney disease Sister   . Cancer Brother   . Breast cancer Maternal Grandmother 53    Social History Social History   Tobacco Use  . Smoking status: Current Some Day Smoker    Packs/day: 0.25    Years: 30.00    Pack years: 7.50    Types: Cigarettes    Last attempt to quit: 12/28/2016    Years since quitting: 2.6  . Smokeless tobacco: Never Used  . Tobacco comment: 2 cigs per day  Substance Use Topics  . Alcohol use: Yes    Alcohol/week: 0.0 standard drinks    Comment: socailly  . Drug use: No    Types: Marijuana    Comment: former     Allergies   Topiramate and Tramadol   Review of Systems Review of Systems  Constitutional: Negative for fever.  Respiratory: Negative for shortness of breath.   Cardiovascular: Negative for chest pain.  All other systems reviewed and are negative.    Physical Exam Updated Vital Signs BP 111/79 (BP Location: Left Arm)   Pulse (!) 102   Temp 99.3 F (37.4 C) (Oral)   Resp 18   LMP 11/08/2014   Physical Exam Vitals signs and nursing note reviewed.  Constitutional:      General: She is not in acute distress.    Appearance: She is well-developed.  HENT:     Head: Normocephalic and atraumatic.  Eyes:     Conjunctiva/sclera: Conjunctivae normal.     Pupils: Pupils are equal, round, and reactive to light.  Neck:      Musculoskeletal: Normal range of motion and neck supple.  Cardiovascular:     Rate and Rhythm: Normal rate and regular rhythm.     Heart sounds: Normal heart sounds.  Pulmonary:     Effort: Pulmonary effort is normal. No respiratory distress.     Breath sounds: Wheezing present.     Comments: Mild diffuse expiratory wheezes in all lung fields Abdominal:     General: There is no distension.     Palpations: Abdomen is soft.     Tenderness: There is no abdominal tenderness.  Musculoskeletal: Normal range of motion.        General: No deformity.  Skin:    General: Skin is warm and dry.  Neurological:     Mental Status: She is alert and oriented to person, place, and time.      ED Treatments / Results  Labs (all labs ordered are listed, but only abnormal results are displayed) Labs Reviewed - No data to display  EKG None  Radiology No results found.  Procedures Procedures (including critical care time)  Medications Ordered in ED Medications  albuterol (VENTOLIN HFA) 108 (90 Base) MCG/ACT inhaler 2 puff (has no administration in time range)  predniSONE (DELTASONE) tablet 60 mg (has no administration in time range)     Initial Impression / Assessment and Plan / ED Course  I have reviewed the triage vital signs and the nursing notes.  Pertinent labs & imaging results that were available during my care of the patient were reviewed by me and considered in my medical decision making (see chart for details).        MDM  Screen complete  AUSHA SIEH was evaluated in Emergency Department on 08/23/2019 for the symptoms described in the history of present illness. She was evaluated in the context of the global COVID-19 pandemic, which necessitated consideration that the patient might be at risk for infection with the SARS-CoV-2 virus that causes COVID-19. Institutional protocols and algorithms that pertain to the evaluation of patients at risk for COVID-19 are in a state of  rapid change based on information released by regulatory bodies including the CDC and federal and state organizations. These policies and algorithms were followed during the patient's care in the ED.  Patient is presenting with symptoms consistent with mild asthma exacerbation.  Patient is improved following her ED evaluation and treatment.  She desires discharge.  Importance of close follow-up was stressed.  Strict return precautions given and understood.     Final Clinical Impressions(s) / ED Diagnoses   Final diagnoses:  Mild asthma with exacerbation, unspecified whether persistent    ED Discharge Orders         Ordered    albuterol (PROVENTIL) (2.5 MG/3ML) 0.083% nebulizer solution  Every 6 hours PRN     08/23/19 1958    albuterol (VENTOLIN HFA) 108 (90 Base) MCG/ACT inhaler  Every 6 hours PRN     08/23/19 1958    predniSONE (DELTASONE) 10 MG tablet  Daily     08/23/19 1958    azithromycin (ZITHROMAX) 250 MG tablet  Daily     08/23/19 1958           Valarie Merino, MD 08/23/19 2001

## 2019-08-23 NOTE — ED Triage Notes (Signed)
Per pt, states she is out of her inhalers and needs a neb treatment-states  History of asthma and is out of meds

## 2019-08-27 NOTE — Addendum Note (Signed)
Addended by: Hulan Fray on: 08/27/2019 06:29 PM   Modules accepted: Orders

## 2019-08-28 ENCOUNTER — Telehealth: Payer: Self-pay | Admitting: *Deleted

## 2019-08-28 ENCOUNTER — Encounter: Payer: Self-pay | Admitting: Internal Medicine

## 2019-08-28 ENCOUNTER — Other Ambulatory Visit: Payer: Self-pay

## 2019-08-28 ENCOUNTER — Telehealth: Payer: Self-pay

## 2019-08-28 ENCOUNTER — Ambulatory Visit (INDEPENDENT_AMBULATORY_CARE_PROVIDER_SITE_OTHER): Payer: Self-pay | Admitting: Internal Medicine

## 2019-08-28 VITALS — BP 109/77 | HR 91 | Temp 98.1°F | Ht 63.0 in | Wt 148.8 lb

## 2019-08-28 DIAGNOSIS — Z7951 Long term (current) use of inhaled steroids: Secondary | ICD-10-CM

## 2019-08-28 DIAGNOSIS — F17210 Nicotine dependence, cigarettes, uncomplicated: Secondary | ICD-10-CM

## 2019-08-28 DIAGNOSIS — J449 Chronic obstructive pulmonary disease, unspecified: Secondary | ICD-10-CM

## 2019-08-28 MED ORDER — PREDNISONE 20 MG PO TABS: 40.0000 mg | ORAL_TABLET | Freq: Every day | ORAL | 0 refills | Status: AC

## 2019-08-28 MED ORDER — METHYLPREDNISOLONE SODIUM SUCC 125 MG IJ SOLR: 125.0000 mg | Freq: Once | INTRAMUSCULAR | Status: DC

## 2019-08-28 MED ORDER — IPRATROPIUM-ALBUTEROL 0.5-2.5 (3) MG/3ML IN SOLN: 3.0000 mL | Freq: Once | RESPIRATORY_TRACT | Status: DC

## 2019-08-28 NOTE — Progress Notes (Signed)
CC: acute on chronic COPD  HPI:  Kathleen [name] is a 58 y.o. female with PMH below.  Today we will address acute on chronic COPD  Please see A&P for status of the patient's chronic medical conditions  Past Medical History:  Diagnosis Date  . Asthma    2 sets of PFT's in 04/09 without sign variability. Last set with significant decrease in FEV1 with saline alone, suggesting Asthma but  recommended clinical corelation   . Asthma   . Bipolar 1 disorder [facility])    therapist is Caryl Asp and is followed by [facility] [facility]  . Blackout    negative work up including ESR, ANA, opthalmology referral, carotid dopplers, 2D echo, MRI and EEG.  . BREAST LUMP 03/25/2008   Annotation: bilaterally Qualifier: Diagnosis of  By: Tomasa Hosteller MD, Edmon Crape.   . 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 Bronchitis   . Chronic headache   . Chronic low back pain 08/08/2008   Qualifier: Diagnosis of  By: Redmond Pulling  MD, Mateo Flow    . Chronic pain    normal work up including TSH, RPR, B12, HIV, plain films, 2 ESR's, ANA, CK, RF along with routine CBC, CMET and UA. Further work up includes CRP, ESR, SPEP/UPEP, hepatitis erology, A1C , repeat ANA  . COPD (chronic obstructive pulmonary disease) ([place])   . COPD exacerbation ([place]) 03/31/2019  . DUB (dysfunctional uterine bleeding)    and pelvic pain, with negative endometrial bx in 07/09 and transvaginal U/S significant for mild fibroids in 0/09.  Marland Kitchen GERD (gastroesophageal reflux disease)   . Hallucinations 03/18/2008   Qualifier: Diagnosis of  By: Redmond Pulling  MD, Mateo Flow    . Lower extremity edema    Neg ABI's, normal 2D echo, normal albumin  . Menorrhagia   . Ovarian cyst   . Polysubstance abuse ([place])    none since March 17,2009.  Marland Kitchen Sleep apnea    NO CPAP  . 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 Tubular adenoma of colon    Review of Systems:  ROS: Pulmonary: pt endorses shortness of breath,  Cardiac: pt denies  palpitations, chest pain,  Abdominal: pt denies abdominal pain, endorses some post tussive emesis  Physical Exam:  Vitals:   08/28/19 1608  BP: 109/77  Pulse: 91  Temp: 98.1 F (36.7 C)  TempSrc: Oral  SpO2: 97%  Weight: 148 lb 12.8 oz (67.5 kg)  Height: _0  (1.6 m)   Cardiac: JVD flat, normal rate and rhythm, clear s1 and s2, no murmurs, rubs or gallops, no LE edema Pulmonary: diffuse wheezing bilaterally, not in distress Abdominal: non distended abdomen, soft and nontender Psych: Alert, conversant, in good spirits   Social History   Socioeconomic History  . Marital status: Single    Spouse name: Not on file  . Number of children: Not on file  . Years of education: Not on file  . Highest education level: Not on file  Occupational History  . Not on file  Social Needs  . Financial resource strain: Not on file  . Food insecurity    Worry: Not on file    Inability: Not on file  . Transportation needs    Medical: Not on file    Non-medical: Not on file  Tobacco Use  . Smoking status: Current Some Day Smoker    Packs/day: 0.25    Years: 30.00    Pack years: 7.50  Types: Cigarettes    Last attempt to quit: 12/28/2016    Years since quitting: 2.6  . Smokeless tobacco: Never Used  . Tobacco comment: 2 cigs per day  Substance and Sexual Activity  . Alcohol use: Yes    Alcohol/week: 0.0 standard drinks    Comment: socailly  . Drug use: No    Types: Marijuana    Comment: former  . Sexual activity: Yes    Birth control/protection: None    Comment: monogamous  Lifestyle  . Physical activity    Days per week: Not on file    Minutes per session: Not on file  . Stress: Not on file  Relationships  . Social Herbalist on phone: Not on file    Gets together: Not on file    Attends religious service: Not on file    Active member of club or organization: Not on file    Attends meetings of clubs or organizations: Not on file    Relationship status: Not  on file  . Intimate partner violence    Fear of current or ex partner: Not on file    Emotionally abused: Not on file    Physically abused: Not on file    Forced sexual activity: Not on file  Other Topics Concern  . Not on file  Social History Narrative   ** Merged History Encounter **       Lives between [place] and boyfriend's house , currently trying to abstain from illegal substances, continues to smoke and says nicotine patch made her sick.    Family History  Problem Relation Age of Onset  . Colon cancer Mother 37  . Breast cancer Mother 31  . Diabetes Father   . Hypertension Father   . Kidney disease Father   . Colon cancer Father 46  . Prostate cancer Father   . Cancer Father   . Kidney disease Sister   . Cancer Brother   . Breast cancer Maternal Grandmother 103    Assessment & Plan:   See Encounters Tab for problem based charting.  Patient discussed with Dr. Lynnae January

## 2019-08-28 NOTE — Patient Instructions (Signed)
Ms. Haker we will treat you today with a nebulizer treatment solumedrol and a short course of steroid.  I have given you a sample inhaler of BREO today.  Please call [facility] and make sure that your inhalers are on their way.

## 2019-08-28 NOTE — Telephone Encounter (Signed)
ERROR

## 2019-08-28 NOTE — Telephone Encounter (Signed)
Pt calls and is having insp/expir wheezes, voice is "squeaky". She states she is out of meds and only has transportation this pm, she needs medicine. Refuses ED again, went earlier this week and states they just couldn't help like her doctors. [facility] 1545 today

## 2019-08-29 NOTE — Assessment & Plan Note (Signed)
Pt presents with acute on chronic COPD vs Asthma.  She has been out of her inhalers since Sunday.  Was set up with [facility] but says they have not been delivered her new inhalers yet. Verified by chart review they have been ordered. Dry cough.  No fevers.  She is short of breath. Some associated sore throat and emesis from cough. Never received her prednisone from the ED either because it was sent to [facility] in [place] as well.  She is eager to get back to work wanted to go back this week but we settled on Monday tentatively depending on improvement.  -solumedrol and duoneb here in clinic with improvement in symptoms -sent a steroid burst to [place] pharmacy -gave her a sample BREO we had here she was instructed to call [facility] and verify the inhalers are on their way.

## 2019-09-03 NOTE — Progress Notes (Signed)
Internal Medicine Clinic Attending  Case discussed with Dr. Winfrey  at the time of the visit.  We reviewed the resident's history and exam and pertinent patient test results.  I agree with the assessment, diagnosis, and plan of care documented in the resident's note.  

## 2019-09-10 ENCOUNTER — Telehealth: Payer: Self-pay | Admitting: *Deleted

## 2019-09-10 NOTE — Telephone Encounter (Signed)
Pt. Forgot about appointment,she was at work,nurse visit and procedure  rescheduled per pt. Request.

## 2019-09-18 ENCOUNTER — Ambulatory Visit (AMBULATORY_SURGERY_CENTER): Payer: Self-pay | Admitting: *Deleted

## 2019-09-18 ENCOUNTER — Other Ambulatory Visit: Payer: Self-pay

## 2019-09-18 VITALS — Temp 97.2°F | Ht 63.0 in | Wt 151.8 lb

## 2019-09-18 DIAGNOSIS — Z86010 Personal history of colonic polyps: Secondary | ICD-10-CM

## 2019-09-18 MED ORDER — PEG 3350-KCL-NA BICARB-NACL 420 G PO SOLR: 4000.0000 mL | Freq: Once | ORAL | 0 refills | Status: AC

## 2019-09-18 NOTE — Progress Notes (Signed)

## 2019-09-20 ENCOUNTER — Encounter: Payer: Self-pay | Admitting: Internal Medicine

## 2019-09-20 ENCOUNTER — Ambulatory Visit (INDEPENDENT_AMBULATORY_CARE_PROVIDER_SITE_OTHER): Payer: Self-pay | Admitting: Internal Medicine

## 2019-09-20 VITALS — BP 108/74 | HR 98 | Wt 152.3 lb

## 2019-09-20 DIAGNOSIS — Z7951 Long term (current) use of inhaled steroids: Secondary | ICD-10-CM

## 2019-09-20 DIAGNOSIS — Z79899 Other long term (current) drug therapy: Secondary | ICD-10-CM

## 2019-09-20 DIAGNOSIS — J4489 Other specified chronic obstructive pulmonary disease: Secondary | ICD-10-CM

## 2019-09-20 DIAGNOSIS — J4541 Moderate persistent asthma with (acute) exacerbation: Secondary | ICD-10-CM

## 2019-09-20 DIAGNOSIS — J449 Chronic obstructive pulmonary disease, unspecified: Secondary | ICD-10-CM

## 2019-09-20 DIAGNOSIS — J441 Chronic obstructive pulmonary disease with (acute) exacerbation: Secondary | ICD-10-CM

## 2019-09-20 MED ORDER — SPIRIVA HANDIHALER 18 MCG IN CAPS: 18.0000 ug | ORAL_CAPSULE | Freq: Every day | RESPIRATORY_TRACT | 0 refills | Status: DC

## 2019-09-20 MED ORDER — METHYLPREDNISOLONE SODIUM SUCC 125 MG IJ SOLR
125.0000 mg | Freq: Once | INTRAMUSCULAR | Status: AC
  Administered 2019-09-20: 125 mg via INTRAMUSCULAR

## 2019-09-20 MED ORDER — PREDNISONE 20 MG PO TABS: 40.0000 mg | ORAL_TABLET | Freq: Every day | ORAL | 0 refills | Status: AC

## 2019-09-20 MED FILL — SPIRIVA 18 MCG CP-HANDIHALE: 18 | 30 days supply | Qty: 30 | Fill #0

## 2019-09-20 MED FILL — predniSONE 20 MG TABS: 20 | 4 days supply | Qty: 8 | Fill #0

## 2019-09-20 NOTE — Assessment & Plan Note (Addendum)
Patient is presenting for Asthma vs COPD in mild exacerbation. She is on Dulera, Spiriva, and PRN albuterol. She is currently out of Spiriva as she was taking this twice a day instead of daily as intended.   She reports 2 days of increase shortness of breath, wheezing, and using her albuterol about every 2 hours. She is oxygenating well on room air and is in mild distress. Exam singifacant for diffuse expiratory wheezing and mild increased work of breathing (but continue to be able to speak in full sentences).  She obtains her medications through [facility] so refill may take some time, will provide one time $4 spiriva refill at [facility] to cover her until she can get her refill. She is to continue [facility] and PRN albuterol and call [facility] for refills. She will also be given a steroid injection follow by 4 days of PO prednisone for her exacerbation. - Dulera 200-5, 2 puffs BID - Spiriva 1 2 puffs Daily, (Will use HandiHaler, 1 capsule Daily, until refill arrives) - Albuterol PRN - Daily Singulair (for allergies and Asthma component) - Methylprednisolone 125mg  IM Once - Prednisone 40mg  Daily for 4 days starting tomorrow - Return precautions given

## 2019-09-20 NOTE — Patient Instructions (Signed)
Thank you for allowing [place] to care for you  For your shortness of breath - This is due to an exacerbation of your chronic lung disease - Steroid injection given, Oral steroids prescribed - Prescription for Spiriva to take daily given, while you wait for refill - Continue with [facility] and as needed albuterol - Return to clinic if symptoms worsen or fail to improve - Call [facility] for refills

## 2019-09-20 NOTE — Progress Notes (Signed)
CC:  Asthma, COPD exacerbation     HPI:  Kathleen [name] is a 58 y.o. F with PMHx listed below presenting for Asthma, COPD exacerbation. Please see the A&P for the status of the patient's chronic medical problems.   Past Medical History:  Diagnosis Date  . Anxiety   . Arthritis    hands  . Asthma    2 sets of PFT's in 04/09 without sign variability. Last set with significant decrease in FEV1 with saline alone, suggesting Asthma but  recommended clinical corelation   . Asthma   . Bipolar 1 disorder [facility])    therapist is Caryl Asp and is followed by [facility]  . Blackout    negative work up including ESR, ANA, opthalmology referral, carotid dopplers, 2D echo, MRI and EEG.  . BREAST LUMP 03/25/2008   Annotation: bilaterally Qualifier: Diagnosis of  By: Tomasa Hosteller MD, Edmon Crape.   . 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 Bronchitis   . Chronic headache   . Chronic low back pain 08/08/2008   Qualifier: Diagnosis of  By: Redmond Pulling  MD, Mateo Flow    . Chronic pain    normal work up including TSH, RPR, B12, HIV, plain films, 2 ESR's, ANA, CK, RF along with routine CBC, CMET and UA. Further work up includes CRP, ESR, SPEP/UPEP, hepatitis erology, A1C , repeat ANA  . COPD (chronic obstructive pulmonary disease) ([place])   . COPD exacerbation ([place]) 03/31/2019  . Depression   . DUB (dysfunctional uterine bleeding)    and pelvic pain, with negative endometrial bx in 07/09 and transvaginal U/S significant for mild fibroids in 0/09.  . Emphysema of lung ([place])   . GERD (gastroesophageal reflux disease)   . Hallucinations 03/18/2008   Qualifier: Diagnosis of  By: Redmond Pulling  MD, Mateo Flow    . Hyperlipidemia   . Lower extremity edema    Neg ABI's, normal 2D echo, normal albumin  . Menorrhagia   . Ovarian cyst   . Polysubstance abuse ([place])    none since March 17,2009.  Marland Kitchen Sleep apnea    NO CPAP  . 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 Thrombosis  of ovarian vein 12/13/2010  . Tubular adenoma of colon    Review of Systems:  Performed and all others negative.  Physical Exam:  Vitals:   09/20/19 1347  BP: 108/74  Pulse: 98  SpO2: 98%  Weight: 152 lb 4.8 oz (69.1 kg)   Physical Exam Constitutional:      General: She is not in acute distress.    Appearance: Normal appearance.  Cardiovascular:     Rate and Rhythm: Normal rate and regular rhythm.     Pulses: Normal pulses.     Heart sounds: Normal heart sounds.  Pulmonary:     Effort: No respiratory distress.     Breath sounds: Wheezing present.     Comments: Mildly increase work of breathing Able to speak in full sentences Diffuse expiratory wheezing Abdominal:     General: Bowel sounds are normal. There is no distension.     Palpations: Abdomen is soft.     Tenderness: There is no abdominal tenderness.  Musculoskeletal:        General: No swelling or deformity.  Skin:    General: Skin is warm and dry.  Neurological:     General: No focal deficit present.     Mental Status: Mental status  is at baseline.     Assessment & Plan:   See Encounters Tab for problem based charting.  Patient discussed with Dr. Daryll Drown

## 2019-09-20 NOTE — Progress Notes (Deleted)
   CC: SHOB  HPI:  [name] is a 58 y.o. female with PMHx listed below presenting for [facility]. Please see the A&P for the status of the patient's chronic medical problems.  Past Medical History:  Diagnosis Date  . Anxiety   . Arthritis    hands  . Asthma    2 sets of PFT's in 04/09 without sign variability. Last set with significant decrease in FEV1 with saline alone, suggesting Asthma but  recommended clinical corelation   . Asthma   . Bipolar 1 disorder [facility])    therapist is Caryl Asp and is followed by [facility]  . Blackout    negative work up including ESR, ANA, opthalmology referral, carotid dopplers, 2D echo, MRI and EEG.  . BREAST LUMP 03/25/2008   Annotation: bilaterally Qualifier: Diagnosis of  By: Tomasa Hosteller MD, Edmon Crape.   . 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 Bronchitis   . Chronic headache   . Chronic low back pain 08/08/2008   Qualifier: Diagnosis of  By: Redmond Pulling  MD, Mateo Flow    . Chronic pain    normal work up including TSH, RPR, B12, HIV, plain films, 2 ESR's, ANA, CK, RF along with routine CBC, CMET and UA. Further work up includes CRP, ESR, SPEP/UPEP, hepatitis erology, A1C , repeat ANA  . COPD (chronic obstructive pulmonary disease) ([place])   . COPD exacerbation ([place]) 03/31/2019  . Depression   . DUB (dysfunctional uterine bleeding)    and pelvic pain, with negative endometrial bx in 07/09 and transvaginal U/S significant for mild fibroids in 0/09.  . Emphysema of lung ([place])   . GERD (gastroesophageal reflux disease)   . Hallucinations 03/18/2008   Qualifier: Diagnosis of  By: Redmond Pulling  MD, Mateo Flow    . Hyperlipidemia   . Lower extremity edema    Neg ABI's, normal 2D echo, normal albumin  . Menorrhagia   . Ovarian cyst   . Polysubstance abuse ([place])    none since March 17,2009.  Marland Kitchen Sleep apnea    NO CPAP  . 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 Tubular  adenoma of colon    Review of Systems:  Performed and all others negative.  Physical Exam: There were no vitals filed for this visit. General: Well nourished *** in no acute distress HENT: Normocephalic, atraumatic, moist mucus membranes Pulm: Good air movement with no wheezing or crackles  CV: RRR, no murmurs, no rubs   Assessment & Plan:   See Encounters Tab for problem based charting.  Patient {GC/GE:3044014::"discussed with","seen with"} Dr. {NAMES:[id number]::"Butcher","Guilloud","Hoffman","Mullen","Narendra","Raines","Vincent"}

## 2019-09-24 MED ORDER — EPINEPHRINE 0.3 MG/0.3ML IJ SOAJ: 0.3000 mg | INTRAMUSCULAR | 1 refills | Status: DC | PRN

## 2019-09-25 NOTE — Addendum Note (Signed)
Addended by: Gilles Chiquito B on: 09/25/2019 02:47 PM   Modules accepted: Level of Service

## 2019-09-25 NOTE — Progress Notes (Signed)
Internal Medicine Clinic Attending  Case discussed with Dr. Melvin  at the time of the visit.  We reviewed the resident's history and exam and pertinent patient test results.  I agree with the assessment, diagnosis, and plan of care documented in the resident's note.  

## 2019-10-08 ENCOUNTER — Other Ambulatory Visit: Payer: Self-pay

## 2019-10-08 ENCOUNTER — Encounter: Payer: Self-pay | Admitting: Internal Medicine

## 2019-10-08 ENCOUNTER — Ambulatory Visit (AMBULATORY_SURGERY_CENTER): Payer: Self-pay | Admitting: Internal Medicine

## 2019-10-08 VITALS — BP 99/70 | HR 83 | Temp 97.8°F | Resp 17 | Ht 63.0 in | Wt 151.8 lb

## 2019-10-08 DIAGNOSIS — K621 Rectal polyp: Secondary | ICD-10-CM

## 2019-10-08 DIAGNOSIS — Z860100 Personal history of colon polyps, unspecified: Secondary | ICD-10-CM

## 2019-10-08 DIAGNOSIS — D120 Benign neoplasm of cecum: Secondary | ICD-10-CM

## 2019-10-08 DIAGNOSIS — D128 Benign neoplasm of rectum: Secondary | ICD-10-CM

## 2019-10-08 DIAGNOSIS — Z800 Family history of malignant neoplasm of digestive organs: Secondary | ICD-10-CM

## 2019-10-08 MED ORDER — SODIUM CHLORIDE 0.9 % IV SOLN: 500.0000 mL | Freq: Once | INTRAVENOUS | Status: DC

## 2019-10-08 NOTE — Patient Instructions (Signed)
HANDOUTS PROVIDED ON: POLYPS THE POLYPS REMOVED TODAY HAVE BEEN SENT FOR PATHOLOGY.  THE RESULTS CAN TAKE 2-3 WEEKS TO RECEIVE.  THE TIMEFRAME BEFORE YOUR NEXT COLONOSCOPY WILL BE DETERMINED BASED ON THE PATHOLOGY RESULTS.    YOU MAY RESUME YOUR PREVIOUS DIET AND MEDICATION SCHEDULE.  [place] YOU FOR ALLOWING [place] TO CARE FOR YOU TODAY!!  YOU HAD AN ENDOSCOPIC PROCEDURE TODAY AT [facility]:   Refer to the procedure report that was given to you for any specific questions about what was found during the examination.  If the procedure report does not answer your questions, please call your gastroenterologist to clarify.  If you requested that your care partner not be given the details of your procedure findings, then the procedure report has been included in a sealed envelope for you to review at your convenience later.  YOU SHOULD EXPECT: Some feelings of bloating in the abdomen. Passage of more gas than usual.  Walking can help get rid of the air that was put into your GI tract during the procedure and reduce the bloating. If you had a lower endoscopy (such as a colonoscopy or flexible sigmoidoscopy) you may notice spotting of blood in your stool or on the toilet paper. If you underwent a bowel prep for your procedure, you may not have a normal bowel movement for a few days.  Please Note:  You might notice some irritation and congestion in your nose or some drainage.  This is from the oxygen used during your procedure.  There is no need for concern and it should clear up in a day or so.  SYMPTOMS TO REPORT IMMEDIATELY:   Following lower endoscopy (colonoscopy or flexible sigmoidoscopy):  Excessive amounts of blood in the stool  Significant tenderness or worsening of abdominal pains  Swelling of the abdomen that is new, acute  Fever of 100F or higher  For urgent or emergent issues, a gastroenterologist can be reached at any hour by calling [phone].   DIET:  We do  recommend a small meal at first, but then you may proceed to your regular diet.  Drink plenty of fluids but you should avoid alcoholic beverages for 24 hours.  ACTIVITY:  You should plan to take it easy for the rest of today and you should NOT DRIVE or use heavy machinery until tomorrow (because of the sedation medicines used during the test).    FOLLOW UP: Our staff will call the number listed on your records 48-72 hours following your procedure to check on you and address any questions or concerns that you may have regarding the information given to you following your procedure. If we do not reach you, we will leave a message.  We will attempt to reach you two times.  During this call, we will ask if you have developed any symptoms of COVID 19. If you develop any symptoms (ie: fever, flu-like symptoms, shortness of breath, cough etc.) before then, please call [phone].  If you test positive for Covid 19 in the 2 weeks post procedure, please call and report this information to [place].    If any biopsies were taken you will be contacted by phone or by letter within the next 1-3 weeks.  Please call us at [phone] if you have not heard about the biopsies in 3 weeks.    SIGNATURES/CONFIDENTIALITY: You and/or your care partner have signed paperwork which will be entered into your electronic medical record.  These signatures attest to the  fact that that the information above on your After Visit Summary has been reviewed and is understood.  Full responsibility of the confidentiality of this discharge information lies with you and/or your care-partner.

## 2019-10-08 NOTE — Op Note (Signed)
[place] Patient Name: Kathleen [name] Procedure Date: 10/08/2019 3:14 PM MRN: [id number] Endoscopist: Jerene Bears , MD Age: 58 Referring MD:  Date of Birth: April 04, 1961 Gender: Female Account #: [id number] Procedure:                Colonoscopy Indications:              High risk colon cancer surveillance: Personal                            history of non-advanced adenoma, Family history of                            colon cancer in multiple first-degree relatives,                            Last colonoscopy 5 years ago Medicines:                Monitored Anesthesia Care Procedure:                Pre-Anesthesia Assessment:                           - Prior to the procedure, a History and Physical                            was performed, and patient medications and                            allergies were reviewed. The patient's tolerance of                            previous anesthesia was also reviewed. The risks                            and benefits of the procedure and the sedation                            options and risks were discussed with the patient.                            All questions were answered, and informed consent                            was obtained. Prior Anticoagulants: The patient has                            taken no previous anticoagulant or antiplatelet                            agents. ASA Grade Assessment: II - A patient with                            mild systemic disease. After reviewing the risks  and benefits, the patient was deemed in                            satisfactory condition to undergo the procedure.                           After obtaining informed consent, the colonoscope                            was passed under direct vision. Throughout the                            procedure, the patient's blood pressure, pulse, and                            oxygen saturations were monitored  continuously. The                            Colonoscope was introduced through the anus and                            advanced to the cecum, identified by appendiceal                            orifice and ileocecal valve. The colonoscopy was                            performed without difficulty. The patient tolerated                            the procedure well. The quality of the bowel                            preparation was good. The ileocecal valve,                            appendiceal orifice, and rectum were photographed. Scope In: 3:20:31 PM Scope Out: 3:37:21 PM Scope Withdrawal Time: 0 hours 14 minutes 1 second  Total Procedure Duration: 0 hours 16 minutes 50 seconds  Findings:                 The digital rectal exam was normal.                           A 4 mm polyp was found in the cecum. The polyp was                            sessile. The polyp was removed with a cold snare.                            Resection and retrieval were complete.                           A 3 mm polyp was found in  the distal rectum (just                            proximal to the dentate line). The polyp was                            sessile. The polyp was removed with a cold snare.                            Resection and retrieval were complete.                           The exam was otherwise without abnormality on                            direct and retroflexion views. Complications:            No immediate complications. Estimated Blood Loss:     Estimated blood loss was minimal. Impression:               - One 4 mm polyp in the cecum, removed with a cold                            snare. Resected and retrieved.                           - One 3 mm polyp in the rectum, removed with a cold                            snare. Resected and retrieved.                           - The examination was otherwise normal on direct                            and retroflexion  views. Recommendation:           - Patient has a contact number available for                            emergencies. The signs and symptoms of potential                            delayed complications were discussed with the                            patient. Return to normal activities tomorrow.                            Written discharge instructions were provided to the                            patient.                           - Resume previous  diet.                           - Continue present medications.                           - Await pathology results.                           - Repeat colonoscopy is recommended for                            surveillance (likely in 5 years). The colonoscopy                            date will be determined after pathology results                            from today's exam become available for review. Jerene Bears, MD 10/08/2019 3:49:53 PM This report has been signed electronically.

## 2019-10-08 NOTE — Progress Notes (Signed)
Report to PACU, RN, vss, BBS= Clear.  

## 2019-10-08 NOTE — Progress Notes (Signed)
Called to room to assist during endoscopic procedure.  Patient ID and intended procedure confirmed with present staff. Received instructions for my participation in the procedure from the performing physician.  

## 2019-10-09 ENCOUNTER — Telehealth: Payer: Self-pay | Admitting: Internal Medicine

## 2019-10-09 MED FILL — predniSONE 20 MG TABS: 20 | 4 days supply | Qty: 8 | Fill #0

## 2019-10-09 NOTE — Telephone Encounter (Signed)
Needs refill on prednisone  ;pt contact [place], [place]

## 2019-10-09 NOTE — Telephone Encounter (Signed)
Previously given prednisone for a COPD exacerbation. This is not a chronic medication. No refills.   Thank you,  [name], MD

## 2019-10-10 ENCOUNTER — Telehealth: Payer: Self-pay

## 2019-10-10 NOTE — Telephone Encounter (Signed)
Follow up call attempted.  NA, no answering machine.

## 2019-10-10 NOTE — Telephone Encounter (Signed)
Second follow up phone call attempt, no answer, unable to leave message

## 2019-10-10 NOTE — Telephone Encounter (Signed)
Pt returned your call stating that she is doing well.

## 2019-10-15 ENCOUNTER — Encounter: Payer: Self-pay | Admitting: Internal Medicine

## 2019-10-17 ENCOUNTER — Other Ambulatory Visit: Payer: Self-pay

## 2019-10-17 NOTE — Telephone Encounter (Signed)
appt made at pt's choosing 11/6 at [facility]

## 2019-10-17 NOTE — Telephone Encounter (Signed)
Requesting to speak with a nurse about getting a refill on prednisone. Please call pt back.

## 2019-10-19 ENCOUNTER — Other Ambulatory Visit: Payer: Self-pay

## 2019-10-19 ENCOUNTER — Ambulatory Visit: Payer: Self-pay | Admitting: Internal Medicine

## 2019-10-19 ENCOUNTER — Encounter: Payer: Self-pay | Admitting: Internal Medicine

## 2019-10-19 VITALS — BP 116/86 | HR 89 | Temp 98.2°F | Ht 63.0 in | Wt 154.6 lb

## 2019-10-19 DIAGNOSIS — Z7951 Long term (current) use of inhaled steroids: Secondary | ICD-10-CM

## 2019-10-19 DIAGNOSIS — R0602 Shortness of breath: Secondary | ICD-10-CM

## 2019-10-19 DIAGNOSIS — J4489 Other specified chronic obstructive pulmonary disease: Secondary | ICD-10-CM

## 2019-10-19 DIAGNOSIS — J4541 Moderate persistent asthma with (acute) exacerbation: Secondary | ICD-10-CM

## 2019-10-19 DIAGNOSIS — J441 Chronic obstructive pulmonary disease with (acute) exacerbation: Secondary | ICD-10-CM

## 2019-10-19 DIAGNOSIS — J45901 Unspecified asthma with (acute) exacerbation: Secondary | ICD-10-CM

## 2019-10-19 DIAGNOSIS — Z79899 Other long term (current) drug therapy: Secondary | ICD-10-CM

## 2019-10-19 DIAGNOSIS — J449 Chronic obstructive pulmonary disease, unspecified: Secondary | ICD-10-CM

## 2019-10-19 DIAGNOSIS — Z20828 Contact with and (suspected) exposure to other viral communicable diseases: Secondary | ICD-10-CM

## 2019-10-19 DIAGNOSIS — R509 Fever, unspecified: Secondary | ICD-10-CM

## 2019-10-19 MED ORDER — PREDNISONE 20 MG PO TABS: 40.0000 mg | ORAL_TABLET | ORAL | 0 refills | Status: AC

## 2019-10-19 MED ORDER — SPIRIVA HANDIHALER 18 MCG IN CAPS: 18.0000 ug | ORAL_CAPSULE | Freq: Every day | RESPIRATORY_TRACT | 2 refills | Status: DC

## 2019-10-19 MED ORDER — ALBUTEROL SULFATE HFA 108 (90 BASE) MCG/ACT IN AERS: 2.0000 | INHALATION_SPRAY | RESPIRATORY_TRACT | 2 refills | Status: DC | PRN

## 2019-10-19 MED ORDER — AZITHROMYCIN 500 MG PO TABS: 500.0000 mg | ORAL_TABLET | ORAL | 0 refills | Status: AC

## 2019-10-19 MED ORDER — ALBUTEROL SULFATE (2.5 MG/3ML) 0.083% IN NEBU: 2.5000 mg | INHALATION_SOLUTION | Freq: Four times a day (QID) | RESPIRATORY_TRACT | 6 refills | Status: DC | PRN

## 2019-10-19 MED ORDER — AZITHROMYCIN 250 MG PO TABS: ORAL_TABLET | ORAL | 0 refills | Status: DC

## 2019-10-19 MED ORDER — DULERA 200-5 MCG/ACT IN AERO: 2.0000 | INHALATION_SPRAY | Freq: Two times a day (BID) | RESPIRATORY_TRACT | 2 refills | Status: DC

## 2019-10-19 MED ORDER — BENZONATATE 100 MG PO CAPS: 100.0000 mg | ORAL_CAPSULE | Freq: Four times a day (QID) | ORAL | 0 refills | Status: DC | PRN

## 2019-10-19 MED FILL — ALBUTEROL SULFATE HFA 108 (: 108 (90 BAS | 17 days supply | Qty: 18 | Fill #0

## 2019-10-19 MED FILL — AZITHROMYCIN 250 MG TABLET: 250 | 4 days supply | Qty: 4 | Fill #0

## 2019-10-19 MED FILL — BENZONATATE 100 MG CAPS: 100 | 7 days supply | Qty: 30 | Fill #0

## 2019-10-19 MED FILL — DULERA 200 MCG/5 MCG INH: 200-5 | 30 days supply | Qty: 13 | Fill #0

## 2019-10-19 MED FILL — SPIRIVA 18 MCG CP-HANDIHALE: 18 | 30 days supply | Qty: 30 | Fill #0

## 2019-10-19 NOTE — Assessment & Plan Note (Addendum)
History: Patient presenting with likely COPD exacerbation.  She reports that she ran out of her dulera and albuterol inhalers 3 days ago.  She reports taking Spiriva as prescribed and taking home duo nebs 4 times per day.  Over the last 3 days patient has noticed increased shortness of breath with a tight chest, increased cough with increased sputum production of thick or yellow-green sputum.  Patient also endorses sore throat, congestion, runny nose, feeling febrile (reports measured temperature of "just below 100").  Patient also reports that her son has just tested positive for coronavirus and she is around him and his children quite often.  A/P: Symptoms consistent with COPD exacerbation, possibly caused by Covid or other viral infection.  Diffuse wheezing on exam. We advised patient that she should be admitted to the hospital for management.  Patient declined this stating she wants to be treated as an outpatient and has financial and social concerns that prevent her from wanting to go to the hospital. *Sent prescriptions to [facility] outpatient pharmacy for inhalers -Dulera, Spiriva, albuterol *5 day course of prednisone and azithromycin - Gave patient 40 mg prednisone and 500 mg azithromycin in clinic.  Gave patient prednisone samples-40 mg daily for 4 more days, starting tomorrow.  Sent prescription for azithromycin 250 mg daily, starting tomorrow (4 more days) and Tessalon Perles for cough. *Advised to go to [facility] testing center for [employer] *Advised patient to call 911 or go to ER if symptoms worsen. *Per patient's request, called work Librarian, academic to inform that she needs to be out of work to be tested for [employer].  Work note provided to patient. *Patient is aware of need to isolate

## 2019-10-19 NOTE — Progress Notes (Signed)
Internal Medicine Clinic Attending  I saw and evaluated the patient.  I personally confirmed the key portions of the history and exam documented by Dr. MacLean and I reviewed pertinent patient test results.  The assessment, diagnosis, and plan were formulated together and I agree with the documentation in the resident's note.  

## 2019-10-19 NOTE — Progress Notes (Signed)
CC: Shortness of breath  HPI: Patient is a 58 year old female with past medical history of COPD versus asthma (PFTs not completed) who presents with a 3-day history of worsening shortness of breath in the setting of known Covid exposure.  [name] is a 58 y.o.   Past Medical History:  Diagnosis Date  . Anxiety   . Arthritis    hands  . Asthma    2 sets of PFT's in 04/09 without sign variability. Last set with significant decrease in FEV1 with saline alone, suggesting Asthma but  recommended clinical corelation   . Asthma   . Bipolar 1 disorder [facility])    therapist is Kathleen [name] and is followed by [employer]  . Blackout    negative work up including ESR, ANA, opthalmology referral, carotid dopplers, 2D echo, MRI and EEG.  . BREAST LUMP 03/25/2008   Annotation: bilaterally Qualifier: Diagnosis of  By: Tomasa Hosteller MD, Edmon Crape.   . 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 Bronchitis   . Chronic headache   . Chronic low back pain 08/08/2008   Qualifier: Diagnosis of  By: Redmond Pulling  MD, Mateo Flow    . Chronic pain    normal work up including TSH, RPR, B12, HIV, plain films, 2 ESR's, ANA, CK, RF along with routine CBC, CMET and UA. Further work up includes CRP, ESR, SPEP/UPEP, hepatitis erology, A1C , repeat ANA  . COPD (chronic obstructive pulmonary disease) ([place])   . COPD exacerbation ([place]) 03/31/2019  . Depression   . DUB (dysfunctional uterine bleeding)    and pelvic pain, with negative endometrial bx in 07/09 and transvaginal U/S significant for mild fibroids in 0/09.  . Emphysema of lung ([place])   . GERD (gastroesophageal reflux disease)   . Hallucinations 03/18/2008   Qualifier: Diagnosis of  By: Redmond Pulling  MD, Mateo Flow    . Hyperlipidemia   . Lower extremity edema    Neg ABI's, normal 2D echo, normal albumin  . Menorrhagia   . Ovarian cyst   . Polysubstance abuse ([place])    none since March 17,2009.  Marland Kitchen Sleep apnea    NO CPAP  .  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 Tubular adenoma of colon    Review of Systems:   Review of Systems  Constitutional: Positive for fever.  HENT: Negative for congestion.   Respiratory: Negative for cough and shortness of breath.   Cardiovascular: Negative for chest pain.       Positive for chest tightness  Gastrointestinal: Negative for abdominal pain, nausea and vomiting.  All other systems reviewed and are negative.   Physical Exam:  Vitals:   10/19/19 0923  BP: 116/86  Pulse: 89  Temp: 98.2 F (36.8 C)  TempSrc: Oral  SpO2: 100%  Weight: 154 lb 9.6 oz (70.1 kg)  Height: '5\' 3"'  (1.6 m)   Physical Exam  Constitutional: She is well-developed, well-nourished, and in no distress.  HENT:  Head: Normocephalic and atraumatic.  Eyes: EOM are normal. Right eye exhibits no discharge. Left eye exhibits no discharge.  Neck: Normal range of motion. No tracheal deviation present.  Cardiovascular: Normal rate and regular rhythm. Exam reveals no gallop and no friction rub.  No murmur heard. Pulmonary/Chest: She is in respiratory distress. She has wheezes (Diffuse, bilaterally). She has no rales.  Increased effort  Abdominal: Soft. She exhibits no distension.  There is no abdominal tenderness. There is no rebound and no guarding.  Musculoskeletal: Normal range of motion.        General: No tenderness, deformity or edema.  Neurological: She is alert. Coordination normal.  Skin: Skin is warm and dry. No rash noted. She is not diaphoretic. No erythema.  Psychiatric: Memory and judgment normal.     Assessment & Plan:   See Encounters Tab for problem based charting.  Patient seen and discussed with Dr. Lynnae January

## 2019-10-19 NOTE — Patient Instructions (Signed)
You were seen for an exacerbation of your COPD.  In clinic we gave you a dose of azithromycin and prednisone.  We want you to take 2 tablets (40 mg) of prednisone once daily starting tomorrow for 4 days.  We also sent a prescription for azithromycin.  We want you to take 1 tablet (250 mg) of azithromycin once daily for 4 days starting tomorrow.  We also sent a prescription for Tessalon Perles for your cough.  We also sent prescriptions for [facility] and Spiriva inhalers.  Please also go to get tested for coronavirus at [facility].  If your symptoms get worse please call 911 and go to the emergency department.  We will call you to see how you are doing on Monday.

## 2019-10-22 ENCOUNTER — Other Ambulatory Visit: Payer: Self-pay | Admitting: Internal Medicine

## 2019-10-22 NOTE — Progress Notes (Unsigned)
Spoke with patient on phone for follow-up on Friday's visit.  Patient reports that she was unable to pick up her inhaler medications as her other son got COVID-19.  Patient reports worsening of her shortness of breath, feels like she cannot breathe when she stands up, reports worsening chest tightness.  At office visit on Friday, patient was advised to be admitted to the hospital (please see that note), but declined.  Patient was again advised on the need to go to the emergency room, and patient agreed and states she will go to the emergency room.  Patient advised that if she feels like she cannot breathe to immediately call 911 and obtain transportation to the emergency room by EMS.

## 2019-10-23 ENCOUNTER — Other Ambulatory Visit: Payer: Self-pay

## 2019-10-23 ENCOUNTER — Telehealth: Payer: Self-pay | Admitting: *Deleted

## 2019-10-23 DIAGNOSIS — Z20822 Contact with and (suspected) exposure to covid-19: Secondary | ICD-10-CM

## 2019-10-23 NOTE — Telephone Encounter (Signed)
Patient walked into [facility] directly after being tested for Covid today at [facility] requesting letter to return to work. Explained that she needs to self-quarantine until she receives results. Patient stated understanding and said she would go directly home. Explained need for wearing mask and keeping hands and surface clean. She lives with fiance and will quarantine away from him. Hubbard Hartshorn, BSN, RN-BC

## 2019-10-24 LAB — NOVEL CORONAVIRUS, NAA: SARS-CoV-2, NAA: NOT DETECTED

## 2019-10-26 ENCOUNTER — Telehealth: Payer: Self-pay | Admitting: Internal Medicine

## 2019-10-26 NOTE — Telephone Encounter (Signed)
RTC, no answer, VM obtained and message left RN was returning call. SChaplin, RN,BSN

## 2019-10-26 NOTE — Telephone Encounter (Signed)
Pls contact pt regarding COVID results [phone]

## 2019-10-29 NOTE — Telephone Encounter (Signed)
Patient called in for results of Covid test taken 6 days ago. Relayed result was negative for Covid. Patient requesting letter stating this and ok to return to work. Will route to PCP. Hubbard Hartshorn, BSN, RN-BC

## 2019-10-29 NOTE — Telephone Encounter (Signed)
Note is in the chart. If she would like to come by the clinic to pick it up it can be printed off otherwise it will be mailed to her.

## 2019-10-29 NOTE — Telephone Encounter (Signed)
Left VM that letter available on MyChart and to call if she would like to pick up a copy or have one mailed to her. Hubbard Hartshorn, BSN, RN-BC

## 2019-11-15 ENCOUNTER — Encounter: Payer: Self-pay | Admitting: Internal Medicine

## 2019-12-03 ENCOUNTER — Other Ambulatory Visit: Payer: Self-pay | Admitting: Internal Medicine

## 2019-12-03 DIAGNOSIS — I82890 Acute embolism and thrombosis of other specified veins: Secondary | ICD-10-CM

## 2019-12-10 ENCOUNTER — Emergency Department (HOSPITAL_COMMUNITY)
Admission: EM | Admit: 2019-12-10 | Discharge: 2019-12-10 | Disposition: A | Discharge status: Home / Self Care | Payer: Self-pay | Attending: Emergency Medicine | Admitting: Emergency Medicine

## 2019-12-10 ENCOUNTER — Encounter (HOSPITAL_COMMUNITY): Payer: Self-pay | Admitting: *Deleted

## 2019-12-10 ENCOUNTER — Other Ambulatory Visit: Payer: Self-pay

## 2019-12-10 ENCOUNTER — Telehealth: Payer: Self-pay | Admitting: Internal Medicine

## 2019-12-10 DIAGNOSIS — Z79899 Other long term (current) drug therapy: Secondary | ICD-10-CM | POA: Insufficient documentation

## 2019-12-10 DIAGNOSIS — F17210 Nicotine dependence, cigarettes, uncomplicated: Secondary | ICD-10-CM | POA: Insufficient documentation

## 2019-12-10 DIAGNOSIS — J449 Chronic obstructive pulmonary disease, unspecified: Secondary | ICD-10-CM | POA: Insufficient documentation

## 2019-12-10 DIAGNOSIS — Z7982 Long term (current) use of aspirin: Secondary | ICD-10-CM | POA: Insufficient documentation

## 2019-12-10 DIAGNOSIS — J45901 Unspecified asthma with (acute) exacerbation: Secondary | ICD-10-CM | POA: Insufficient documentation

## 2019-12-10 DIAGNOSIS — Z8673 Personal history of transient ischemic attack (TIA), and cerebral infarction without residual deficits: Secondary | ICD-10-CM | POA: Insufficient documentation

## 2019-12-10 MED ORDER — ALBUTEROL SULFATE HFA 108 (90 BASE) MCG/ACT IN AERS: 2.0000 | INHALATION_SPRAY | RESPIRATORY_TRACT | 0 refills | Status: DC | PRN

## 2019-12-10 MED ORDER — ALBUTEROL SULFATE HFA 108 (90 BASE) MCG/ACT IN AERS
4.0000 | INHALATION_SPRAY | Freq: Once | RESPIRATORY_TRACT | Status: AC
  Administered 2019-12-10: 4 via RESPIRATORY_TRACT
  Filled 2019-12-10: qty 6.7

## 2019-12-10 MED ORDER — PREDNISONE 20 MG PO TABS: 40.0000 mg | ORAL_TABLET | Freq: Every day | ORAL | 0 refills | Status: AC

## 2019-12-10 MED ORDER — ALBUTEROL SULFATE (2.5 MG/3ML) 0.083% IN NEBU: 2.5000 mg | INHALATION_SOLUTION | Freq: Four times a day (QID) | RESPIRATORY_TRACT | 12 refills | Status: DC | PRN

## 2019-12-10 MED ORDER — DEXAMETHASONE SODIUM PHOSPHATE 10 MG/ML IJ SOLN
10.0000 mg | Freq: Once | INTRAMUSCULAR | Status: AC
  Administered 2019-12-10: 10 mg via INTRAMUSCULAR
  Filled 2019-12-10: qty 1

## 2019-12-10 NOTE — Telephone Encounter (Signed)
Return pt's call; no answer - left message to call the office. 

## 2019-12-10 NOTE — Discharge Instructions (Signed)
Recommend calling your primary doctor to schedule recheck regarding your symptoms you are experiencing today.  If you develop any fever, any increased work of breathing, please return to ER for reassessment.  Use inhaler as instructed, take steroids as instructed.

## 2019-12-10 NOTE — ED Triage Notes (Signed)
Pt with hx of asthma complains of shortness of breath since yesterday. She tried her nebulizer at home w/o relief. Pt states she is supposed to have a dulera inhaler but it has not come yet.

## 2019-12-10 NOTE — ED Provider Notes (Signed)
[facility] DEPT Provider Note   CSN: [id number] Arrival date & time: 12/10/19  1635     History Chief Complaint  Patient presents with  . Asthma    Kathleen [name] is a 58 y.o. female.  Past medical history asthma, bipolar disorder, COPD presents to emergency department chief complaint shortness of breath.  Patient states since yesterday she has noted steadily increasing shortness of breath, with increased wheezing.  States that she ran out of her nebulizer and steroids at home.  States may have had low-grade temperature yesterday, no fever today.  Has had mild cough, mostly nonproductive, did note some mild yellow sputum.  No known Covid exposures.  States this feels similar to prior asthma exacerbations.  No chest pain.  HPI     Past Medical History:  Diagnosis Date  . Anxiety   . Arthritis    hands  . Asthma    2 sets of PFT's in 04/09 without sign variability. Last set with significant decrease in FEV1 with saline alone, suggesting Asthma but  recommended clinical corelation   . Asthma   . Bipolar 1 disorder [facility])    therapist is Caryl Asp and is followed by [employer]  . Blackout    negative work up including ESR, ANA, opthalmology referral, carotid dopplers, 2D echo, MRI and EEG.  . BREAST LUMP 03/25/2008   Annotation: bilaterally Qualifier: Diagnosis of  By: Tomasa Hosteller MD, Edmon Crape.   . 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 Bronchitis   . Chronic headache   . Chronic low back pain 08/08/2008   Qualifier: Diagnosis of  By: Redmond Pulling  MD, Mateo Flow    . Chronic pain    normal work up including TSH, RPR, B12, HIV, plain films, 2 ESR's, ANA, CK, RF along with routine CBC, CMET and UA. Further work up includes CRP, ESR, SPEP/UPEP, hepatitis erology, A1C , repeat ANA  . COPD (chronic obstructive pulmonary disease) ([place])   . COPD exacerbation ([place]) 03/31/2019  . Depression   . DUB (dysfunctional  uterine bleeding)    and pelvic pain, with negative endometrial bx in 07/09 and transvaginal U/S significant for mild fibroids in 0/09.  . Emphysema of lung ([place])   . GERD (gastroesophageal reflux disease)   . Hallucinations 03/18/2008   Qualifier: Diagnosis of  By: Redmond Pulling  MD, Mateo Flow    . Hyperlipidemia   . Lower extremity edema    Neg ABI's, normal 2D echo, normal albumin  . Menorrhagia   . Ovarian cyst   . Polysubstance abuse ([place])    none since March 17,2009.  Marland Kitchen Sleep apnea    NO CPAP  . 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 Tubular adenoma of colon     Patient Active Problem List   Diagnosis Date Noted  . Daytime somnolence 04/06/2019  . Numbness and tingling in right hand 03/09/2019  . Lumbar radiculopathy 07/26/2016  . Depression 09/19/2015  . Preventative health care 06/18/2014  . 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 BIPOLAR DISORDER UNSPECIFIED 01/21/2010  . GERD 01/21/2010  . Generalized anxiety disorder 03/14/2008  . DRUG ABUSE 03/14/2008  . Migraine variant 03/14/2008    Past Surgical History:  Procedure Laterality Date  . CESAREAN SECTION    . CESAREAN SECTION    . COLONOSCOPY    . HERNIA REPAIR    . Left  partial oophorectomy    . OOPHORECTOMY     1/2 ovary removed  . POLYPECTOMY    . VAGINA SURGERY     mesh     OB History    Gravida  3   Para  2   Term  2   Preterm  0   AB  1   Living  2     SAB  0   TAB  1   Ectopic  0   Multiple      Live Births  2           Family History  Problem Relation Age of Onset  . Colon cancer Mother 15  . Breast cancer Mother 78  . Rectal cancer Mother   . Diabetes Father   . Hypertension Father   . Kidney disease Father   . Colon cancer Father 8  . Prostate cancer Father   . Cancer Father   . Kidney disease Sister   . Cancer Brother   . Breast cancer Maternal Grandmother 103  . Esophageal cancer Neg Hx   . Stomach cancer Neg Hx      Social History   Tobacco Use  . Smoking status: Current Some Day Smoker    Packs/day: 0.25    Years: 30.00    Pack years: 7.50    Types: Cigarettes    Last attempt to quit: 12/28/2016    Years since quitting: 2.9  . Smokeless tobacco: Never Used  . Tobacco comment: Occ.  Substance Use Topics  . Alcohol use: Yes    Alcohol/week: 0.0 standard drinks    Comment: rarely  . Drug use: No    Types: Marijuana    Comment: former    Home Medications Prior to Admission medications   Medication Sig Start Date End Date Taking? Authorizing Provider  albuterol (PROVENTIL) (2.5 MG/3ML) 0.083% nebulizer solution Take 3 mLs (2.5 mg total) by nebulization every 6 (six) hours as needed for wheezing or shortness of breath. 12/10/19   Lucrezia Starch, MD  albuterol (VENTOLIN HFA) 108 (90 Base) MCG/ACT inhaler Inhale 2 puffs into the lungs every 4 (four) hours as needed for wheezing or shortness of breath. 12/10/19   Lucrezia Starch, MD  ASPIRIN ADULT LOW STRENGTH 81 MG EC tablet TAKE 1 Tablet BY MOUTH ONCE DAILY 12/03/19   [name], MD  azithromycin (ZITHROMAX) 250 MG tablet Take 1 tablet (250 mg) once daily starting 10/20/2019 10/19/19   Jeanmarie Hubert, MD  benzonatate (TESSALON PERLES) 100 MG capsule Take 1 capsule (100 mg total) by mouth every 6 (six) hours as needed for cough. 10/19/19 10/18/20  Jeanmarie Hubert, MD  divalproex (DEPAKOTE ER) 500 MG 24 hr tablet Take 1 tablet (500 mg total) by mouth 2 (two) times daily. Patient not taking: Reported on 10/08/2019 08/09/19   [name], MD  EPINEPHrine 0.3 mg/0.3 mL IJ SOAJ injection Inject 0.3 mLs (0.3 mg total) into the muscle as needed for anaphylaxis. Patient not taking: Reported on 10/08/2019 09/24/19   Forde Dandy, PharmD  fluticasone [facility]) 50 MCG/ACT nasal spray Place 1 spray into both nostrils daily. Patient not taking: Reported on 07/11/2019 04/26/19 04/25/20  Forde Dandy, PharmD  ipratropium-albuterol (DUONEB)  0.5-2.5 (3) MG/3ML SOLN Take 3 mLs by nebulization every 6 (six) hours as needed (wheezing or shortness of breath). 06/09/19   Spongberg, Audie Pinto, MD  lidocaine (LIDODERM) 5 % Place 1 patch onto the skin daily. Remove & Discard patch within 12  hours or as directed by MD Patient not taking: Reported on 10/08/2019 05/22/19   Neva Seat, MD  loratadine (CLARITIN) 10 MG tablet Take 1 tablet (10 mg total) by mouth daily. 04/26/19   Forde Dandy, PharmD  metoCLOPramide (REGLAN) 5 MG tablet Take 1 tablet (5 mg total) by mouth 3 (three) times daily before meals for 5 days. 07/17/19 07/22/19  [name], MD  mometasone-formoterol (DULERA) 200-5 MCG/ACT AERO Inhale 2 puffs into the lungs 2 (two) times daily. IM Program 10/19/19   Jeanmarie Hubert, MD  montelukast (SINGULAIR) 10 MG tablet Take 1 tablet (10 mg total) by mouth at bedtime. 04/26/19   Sid Falcon, MD  nicotine (NICODERM CQ - DOSED IN MG/24 HOURS) 14 mg/24hr patch Place 1 patch (14 mg total) onto the skin daily. 07/16/19   [name], MD  omeprazole (PRILOSEC) 20 MG capsule Take by mouth daily. 08/24/19   [provider]  oxyCODONE-acetaminophen (PERCOCET/ROXICET) 5-325 MG tablet Take 1 tablet by mouth every 6 (six) hours as needed for severe pain.    [provider]  pantoprazole (PROTONIX) 40 MG tablet Take 1 tablet (40 mg total) by mouth 2 (two) times daily for 5 days. 07/15/19 07/20/19  [name], MD  predniSONE (DELTASONE) 20 MG tablet Take 2 tablets (40 mg total) by mouth daily for 5 days. 12/10/19 12/15/19  Lucrezia Starch, MD  sertraline (ZOLOFT) 100 MG tablet Take 1 tablet (100 mg total) by mouth 2 (two) times daily. 07/24/19 08/23/19  [place], Nila Nephew D, DO  SUMAtriptan (IMITREX) 25 MG tablet Take 1 tablet (25 mg total) by mouth every 2 (two) hours as needed for migraine or headache. Patient not taking: Reported on 10/08/2019 07/24/19   [name], DO  tiotropium (SPIRIVA HANDIHALER) 18 MCG  inhalation capsule Place 1 capsule (18 mcg total) into inhaler and inhale daily. 10/19/19   Jeanmarie Hubert, MD  Tiotropium Bromide Monohydrate (SPIRIVA RESPIMAT) 2.5 MCG/ACT AERS Inhale 2 puffs into the lungs daily. 05/08/19   [name], MD    Allergies    Topiramate and Tramadol  Review of Systems   Review of Systems  Constitutional: Negative for chills and fever.  HENT: Negative for ear pain and sore throat.   Eyes: Negative for pain and visual disturbance.  Respiratory: Positive for cough and shortness of breath.   Cardiovascular: Negative for chest pain and palpitations.  Gastrointestinal: Negative for abdominal pain and vomiting.  Genitourinary: Negative for dysuria and hematuria.  Musculoskeletal: Negative for arthralgias and back pain.  Skin: Negative for color change and rash.  Neurological: Negative for seizures and syncope.  All other systems reviewed and are negative.   Physical Exam Updated Vital Signs BP 100/83 (BP Location: Right Arm)   Pulse 100   Temp 98.3 F (36.8 C) (Oral)   Resp 20   LMP 11/08/2014   SpO2 96%   Physical Exam Vitals and nursing note reviewed.  Constitutional:      General: She is not in acute distress.    Appearance: She is well-developed.  HENT:     Head: Normocephalic and atraumatic.  Eyes:     Conjunctiva/sclera: Conjunctivae normal.  Cardiovascular:     Rate and Rhythm: Normal rate and regular rhythm.     Heart sounds: No murmur.  Pulmonary:     Effort: Pulmonary effort is normal.     Comments: Speaking in full sentences, no accessory muscle use, bilateral expiratory wheeze noted Abdominal:  Palpations: Abdomen is soft.     Tenderness: There is no abdominal tenderness.  Musculoskeletal:     Cervical back: Neck supple.  Skin:    General: Skin is warm and dry.     Capillary Refill: Capillary refill takes less than 2 seconds.  Neurological:     General: No focal deficit present.     Mental Status: She is alert and  oriented to person, place, and time.  Psychiatric:        Mood and Affect: Mood normal.        Behavior: Behavior normal.     ED Results / Procedures / Treatments   Labs (all labs ordered are listed, but only abnormal results are displayed) Labs Reviewed - No data to display  EKG None  Radiology No results found.  Procedures Procedures (including critical care time)  Medications Ordered in ED Medications  albuterol (VENTOLIN HFA) 108 (90 Base) MCG/ACT inhaler 4 puff (4 puffs Inhalation Given 12/10/19 1718)  dexamethasone (DECADRON) injection 10 mg (10 mg Intramuscular Given 12/10/19 1716)    ED Course  I have reviewed the triage vital signs and the nursing notes.  Pertinent labs & imaging results that were available during my care of the patient were reviewed by me and considered in my medical decision making (see chart for details).  Clinical Course as of Dec 09 1749  Mon Dec 10, 2019  1750 Rechecked, breathing improved, will dc   [RD]    Clinical Course User Index [RD] Lucrezia Starch, MD   MDM Rules/Calculators/A&P                      58 year old lady with known history of COPD, asthma presents to ER with shortness of breath.  On exam patient is well-appearing with normal vital signs, noted bilateral wheezing concerning for asthma exacerbation.  Recommended obtaining CXR to rule out pneumonia, patient states that she does not want this test completed and only wants steroids and albuterol prescription.  Additionally recommended checking Covid however patient states she does not want this completed has been tested many times and all have been negative.  Provided IM Decadron, will give Rx for prednisone, albuterol nebulized solution.  Recommend close recheck with her primary doctor.  Reviewed return precautions, will discharge home.    After the discussed management above, the patient was determined to be safe for discharge.  The patient was in agreement with this  plan and all questions regarding their care were answered.  ED return precautions were discussed and the patient will return to the ED with any significant worsening of condition.     Final Clinical Impression(s) / ED Diagnoses Final diagnoses:  Exacerbation of asthma, unspecified asthma severity, unspecified whether persistent    Rx / DC Orders ED Discharge Orders         Ordered    albuterol (PROVENTIL) (2.5 MG/3ML) 0.083% nebulizer solution  Every 6 hours PRN     12/10/19 1745    albuterol (VENTOLIN HFA) 108 (90 Base) MCG/ACT inhaler  Every 4 hours PRN     12/10/19 1745    predniSONE (DELTASONE) 20 MG tablet  Daily     12/10/19 1745           Lucrezia Starch, MD 12/10/19 1750

## 2019-12-10 NOTE — ED Notes (Signed)
ED Provider at bedside. 

## 2019-12-10 NOTE — Telephone Encounter (Signed)
Pt is wanting to come to get an prescription  for inhaler, pls contact  [phone]

## 2019-12-17 NOTE — Telephone Encounter (Signed)
F/U call to check if received refill as requested; no answer, left HIPAA compliant message to call the office if needed.

## 2019-12-19 ENCOUNTER — Other Ambulatory Visit: Payer: Self-pay | Admitting: Internal Medicine

## 2019-12-19 NOTE — Telephone Encounter (Signed)
Pt is needing a be re-cert for [employer] [facility] pls contact back [phone]; regarding her medicine

## 2019-12-19 NOTE — Telephone Encounter (Signed)
After speaking with med assist called pt to inform her that she will need to go on line to med assist and recert or call med assist and ask for a csw to assist her, went straight to vmail, lm for rtc

## 2019-12-19 NOTE — Telephone Encounter (Signed)
Kathleen [name], who will do the [employer] [facility] [employer]ISTapplications and recerts now that dr kim is on leave?

## 2020-01-09 ENCOUNTER — Other Ambulatory Visit: Payer: Self-pay | Admitting: *Deleted

## 2020-01-09 ENCOUNTER — Telehealth: Payer: Self-pay | Admitting: *Deleted

## 2020-01-09 NOTE — Telephone Encounter (Signed)
Pt presents at front desk wanting a letter stating she is physically able to work. 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 She will pick it up

## 2020-01-09 NOTE — Telephone Encounter (Signed)
closed

## 2020-01-10 NOTE — Telephone Encounter (Signed)
Called pt to inform her of letter being ready, lm for rtc

## 2020-01-10 NOTE — Telephone Encounter (Signed)
Note at front desk. Thank you.

## 2020-01-11 ENCOUNTER — Telehealth: Payer: Self-pay | Admitting: Internal Medicine

## 2020-01-11 NOTE — Telephone Encounter (Signed)
Pt is waiting on a note from physician saying that she can't work due to her health; pls contact [phone]

## 2020-01-11 NOTE — Telephone Encounter (Signed)
Have called pt 3 times, just rtc to pt, went straight to vmail, lm again for rtc

## 2020-01-15 ENCOUNTER — Telehealth: Payer: Self-pay

## 2020-01-15 NOTE — Telephone Encounter (Signed)
I do not feel this is appropriate and will not send this letter. Thank you.

## 2020-01-15 NOTE — Telephone Encounter (Signed)
Pt called and is requesting a letter from PCP which states he recommends pt is "no longer able to work due to health conditions".  Pt states she "has applied for disability twice and both times it was denied, but now she has an attorney to help her get disability". As of note, there is a letter in [place] dated 01/10/20 which states pt may return to work, full duties. Will forward to Dr. Tarri Abernethy to advise. Thank you, Higinio Roger, RN,BSN

## 2020-01-15 NOTE — Telephone Encounter (Signed)
Pt is calling back

## 2020-01-15 NOTE — Telephone Encounter (Signed)
Patient called, voicemail obtained which was non-identifiable, unable to leave message. SChaplin, RN,BSN

## 2020-01-16 NOTE — Telephone Encounter (Signed)
Attempted to call patient. Went straight to [employer]. Thank you.

## 2020-01-16 NOTE — Telephone Encounter (Signed)
Attempted to call the patient. Again no answer. HIPAA complaint voicemail left. Thank you.

## 2020-01-16 NOTE — Telephone Encounter (Signed)
Patient is wanting to speak to Dr Tarri Abernethy, pls contact [phone]

## 2020-01-21 NOTE — Telephone Encounter (Signed)
Attempted to call the patient again. No answer. HIPAA compliant voicemail left. Thank you.

## 2020-01-23 ENCOUNTER — Ambulatory Visit: Payer: Self-pay

## 2020-01-23 NOTE — Telephone Encounter (Signed)
Pt is wanting Dr Tarri Abernethy to call her regarding her letter to be out of work [phone]

## 2020-01-24 NOTE — Telephone Encounter (Signed)
Attempted to call the patient. No answer. Rings once and goes to voicemail. Will continue to try. Thank you.

## 2020-03-19 ENCOUNTER — Ambulatory Visit: Payer: Self-pay

## 2020-03-19 ENCOUNTER — Emergency Department (HOSPITAL_COMMUNITY)
Admission: EM | Admit: 2020-03-19 | Discharge: 2020-03-19 | Disposition: A | Discharge status: Home / Self Care | Payer: Medicaid Other | Attending: Emergency Medicine | Admitting: Emergency Medicine

## 2020-03-19 ENCOUNTER — Other Ambulatory Visit: Payer: Self-pay

## 2020-03-19 ENCOUNTER — Encounter (HOSPITAL_COMMUNITY): Payer: Self-pay

## 2020-03-19 ENCOUNTER — Emergency Department (HOSPITAL_COMMUNITY): Payer: Medicaid Other

## 2020-03-19 DIAGNOSIS — Z8616 Personal history of COVID-19: Secondary | ICD-10-CM | POA: Diagnosis not present

## 2020-03-19 DIAGNOSIS — J4551 Severe persistent asthma with (acute) exacerbation: Secondary | ICD-10-CM | POA: Diagnosis not present

## 2020-03-19 DIAGNOSIS — R0602 Shortness of breath: Secondary | ICD-10-CM | POA: Diagnosis present

## 2020-03-19 DIAGNOSIS — F17210 Nicotine dependence, cigarettes, uncomplicated: Secondary | ICD-10-CM | POA: Diagnosis not present

## 2020-03-19 DIAGNOSIS — Z79899 Other long term (current) drug therapy: Secondary | ICD-10-CM | POA: Insufficient documentation

## 2020-03-19 DIAGNOSIS — Z7982 Long term (current) use of aspirin: Secondary | ICD-10-CM | POA: Insufficient documentation

## 2020-03-19 DIAGNOSIS — J449 Chronic obstructive pulmonary disease, unspecified: Secondary | ICD-10-CM | POA: Insufficient documentation

## 2020-03-19 DIAGNOSIS — F319 Bipolar disorder, unspecified: Secondary | ICD-10-CM | POA: Insufficient documentation

## 2020-03-19 LAB — CBC WITH DIFFERENTIAL/PLATELET
Abs Immature Granulocytes: 0.02 10*3/uL (ref 0.00–0.07)
Basophils Absolute: 0.1 10*3/uL (ref 0.0–0.1)
Basophils Relative: 1 %
Eosinophils Absolute: 0.7 10*3/uL — ABNORMAL HIGH (ref 0.0–0.5)
Eosinophils Relative: 9 %
HCT: 45.6 % (ref 36.0–46.0)
Hemoglobin: 14.9 g/dL (ref 12.0–15.0)
Immature Granulocytes: 0 %
Lymphocytes Relative: 32 %
Lymphs Abs: 2.4 10*3/uL (ref 0.7–4.0)
MCH: 28.8 pg (ref 26.0–34.0)
MCHC: 32.7 g/dL (ref 30.0–36.0)
MCV: 88 fL (ref 80.0–100.0)
Monocytes Absolute: 0.6 10*3/uL (ref 0.1–1.0)
Monocytes Relative: 8 %
Neutro Abs: 3.8 10*3/uL (ref 1.7–7.7)
Neutrophils Relative %: 50 %
Platelets: 352 10*3/uL (ref 150–400)
RBC: 5.18 MIL/uL — ABNORMAL HIGH (ref 3.87–5.11)
RDW: 13.6 % (ref 11.5–15.5)
WBC: 7.5 10*3/uL (ref 4.0–10.5)
nRBC: 0 % (ref 0.0–0.2)

## 2020-03-19 LAB — BASIC METABOLIC PANEL
Anion gap: 10 (ref 5–15)
BUN: 15 mg/dL (ref 6–20)
CO2: 23 mmol/L (ref 22–32)
Calcium: 8.9 mg/dL (ref 8.9–10.3)
Chloride: 107 mmol/L (ref 98–111)
Creatinine, Ser: 1.14 mg/dL — ABNORMAL HIGH (ref 0.44–1.00)
GFR calc Af Amer: >60 mL/min (ref >60)
GFR calc non Af Amer: 53 mL/min — ABNORMAL LOW (ref >60)
Glucose, Bld: 95 mg/dL (ref 70–99)
Potassium: 4.2 mmol/L (ref 3.5–5.1)
Sodium: 140 mmol/L (ref 135–145)

## 2020-03-19 MED ORDER — ALBUTEROL SULFATE (2.5 MG/3ML) 0.083% IN NEBU
5.0000 mg | INHALATION_SOLUTION | Freq: Once | RESPIRATORY_TRACT | Status: AC
  Administered 2020-03-19: 5 mg via RESPIRATORY_TRACT

## 2020-03-19 MED ORDER — ALBUTEROL SULFATE HFA 108 (90 BASE) MCG/ACT IN AERS
2.0000 | INHALATION_SPRAY | RESPIRATORY_TRACT | Status: DC | PRN
  Administered 2020-03-19: 2 via RESPIRATORY_TRACT
  Filled 2020-03-19: qty 6.7

## 2020-03-19 MED ORDER — MAGNESIUM SULFATE 2 GM/50ML IV SOLN
2.0000 g | Freq: Once | INTRAVENOUS | Status: AC
  Administered 2020-03-19: 2 g via INTRAVENOUS
  Filled 2020-03-19: qty 50

## 2020-03-19 MED ORDER — METHYLPREDNISOLONE SODIUM SUCC 125 MG IJ SOLR
125.0000 mg | Freq: Once | INTRAMUSCULAR | Status: AC
  Administered 2020-03-19: 125 mg via INTRAVENOUS
  Filled 2020-03-19: qty 2

## 2020-03-19 MED ORDER — PREDNISONE 20 MG PO TABS: 40.0000 mg | ORAL_TABLET | Freq: Every day | ORAL | 0 refills | Status: DC

## 2020-03-19 MED ORDER — IPRATROPIUM BROMIDE 0.02 % IN SOLN
0.5000 mg | Freq: Once | RESPIRATORY_TRACT | Status: AC
  Administered 2020-03-19: 0.5 mg via RESPIRATORY_TRACT

## 2020-03-19 MED ORDER — ALBUTEROL (5 MG/ML) CONTINUOUS INHALATION SOLN
10.0000 mg/h | INHALATION_SOLUTION | RESPIRATORY_TRACT | Status: DC
  Administered 2020-03-19: 10 mg/h via RESPIRATORY_TRACT
  Filled 2020-03-19: qty 20

## 2020-03-19 NOTE — Discharge Instructions (Signed)
Return to the emergency room if your breathing becomes worse despite the therapy you have at home.  If you start running fever having chest pain or other concerns please return to the ER.

## 2020-03-19 NOTE — ED Triage Notes (Signed)
Pt was going to her doctors appointment downstairs when she became short of breath. Pt states that she is having an asthma attack. Pt can only speak one word at a time. Pt has diminished lung sounds, inspiratory and expiratory wheezing. Pt breathing rapidly and using accessory muscles to breathe. Pt unable to lay back in the bed.

## 2020-03-19 NOTE — ED Provider Notes (Signed)
[facility] EMERGENCY DEPARTMENT Provider Note   CSN: [id number] Arrival date & time: 03/19/20  2536     History Chief Complaint  Patient presents with  . Asthma    Kathleen [name] is a 59 y.o. female.  Patient is a 59 year old female with a history of bipolar disease, asthma/COPD, stroke who is presenting today with worsening shortness of breath.  Patient reports that she was actually at the hospital to see her doctor in the clinic when she suddenly developed more shortness of breath.  Patient states recently she has been congested with allergy symptoms with a nonproductive cough and wheezing.  However things were not getting better so she had scheduled an appointment with her doctor.  While she was in the cafeteria at the breathing became so bad that she could not catch her breath and she was brought to the emergency room.  She denies eating any foods or taking any medications that she would have an allergy to and denies any rashes or itching.  She is complaining of shortness of breath and tightness in her chest.  No recent fevers or productive cough.  Reports she had Covid about a year ago but has not been vaccinated.  She denies any abdominal pain, nausea or vomiting.  No diarrhea.  The history is provided by the patient.  Asthma This is a recurrent problem.       Past Medical History:  Diagnosis Date  . Anxiety   . Arthritis    hands  . Asthma    2 sets of PFT's in 04/09 without sign variability. Last set with significant decrease in FEV1 with saline alone, suggesting Asthma but  recommended clinical corelation   . Asthma   . Bipolar 1 disorder [facility])    therapist is Caryl Asp and is followed by [employer]  . Blackout    negative work up including ESR, ANA, opthalmology referral, carotid dopplers, 2D echo, MRI and EEG.  . BREAST LUMP 03/25/2008   Annotation: bilaterally Qualifier: Diagnosis of  By: Tomasa Hosteller MD, Edmon Crape.   . 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 Bronchitis   . Chronic headache   . Chronic low back pain 08/08/2008   Qualifier: Diagnosis of  By: Redmond Pulling  MD, Mateo Flow    . Chronic pain    normal work up including TSH, RPR, B12, HIV, plain films, 2 ESR's, ANA, CK, RF along with routine CBC, CMET and UA. Further work up includes CRP, ESR, SPEP/UPEP, hepatitis erology, A1C , repeat ANA  . COPD (chronic obstructive pulmonary disease) ([place])   . COPD exacerbation ([place]) 03/31/2019  . Depression   . DUB (dysfunctional uterine bleeding)    and pelvic pain, with negative endometrial bx in 07/09 and transvaginal U/S significant for mild fibroids in 0/09.  . Emphysema of lung ([place])   . GERD (gastroesophageal reflux disease)   . Hallucinations 03/18/2008   Qualifier: Diagnosis of  By: Redmond Pulling  MD, Mateo Flow    . Hyperlipidemia   . Lower extremity edema    Neg ABI's, normal 2D echo, normal albumin  . Menorrhagia   . Ovarian cyst   . Polysubstance abuse ([place])    none since March 17,2009.  Marland Kitchen Sleep apnea    NO CPAP  . 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 Tubular adenoma of colon     Patient Active  Problem List   Diagnosis Date Noted  . Daytime somnolence 04/06/2019  . Numbness and tingling in right hand 03/09/2019  . Lumbar radiculopathy 07/26/2016  . Depression 09/19/2015  . Preventative health care 06/18/2014  . 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 BIPOLAR DISORDER UNSPECIFIED 01/21/2010  . GERD 01/21/2010  . Generalized anxiety disorder 03/14/2008  . DRUG ABUSE 03/14/2008  . Migraine variant 03/14/2008    Past Surgical History:  Procedure Laterality Date  . CESAREAN SECTION    . CESAREAN SECTION    . COLONOSCOPY    . HERNIA REPAIR    . Left partial oophorectomy    . OOPHORECTOMY     1/2 ovary removed  . POLYPECTOMY    . VAGINA SURGERY     mesh     OB History    Gravida  3   Para  2   Term  2   Preterm  0    AB  1   Living  2     SAB  0   TAB  1   Ectopic  0   Multiple      Live Births  2           Family History  Problem Relation Age of Onset  . Colon cancer Mother 13  . Breast cancer Mother 49  . Rectal cancer Mother   . Diabetes Father   . Hypertension Father   . Kidney disease Father   . Colon cancer Father 96  . Prostate cancer Father   . Cancer Father   . Kidney disease Sister   . Cancer Brother   . Breast cancer Maternal Grandmother 103  . Esophageal cancer Neg Hx   . Stomach cancer Neg Hx     Social History   Tobacco Use  . Smoking status: Current Some Day Smoker    Packs/day: 0.25    Years: 30.00    Pack years: 7.50    Types: Cigarettes    Last attempt to quit: 12/28/2016    Years since quitting: 3.2  . Smokeless tobacco: Never Used  . Tobacco comment: Occ.  Substance Use Topics  . Alcohol use: Yes    Alcohol/week: 0.0 standard drinks    Comment: rarely  . Drug use: No    Types: Marijuana    Comment: former    Home Medications Prior to Admission medications   Medication Sig Start Date End Date Taking? Authorizing Provider  albuterol (PROVENTIL) (2.5 MG/3ML) 0.083% nebulizer solution Take 3 mLs (2.5 mg total) by nebulization every 6 (six) hours as needed for wheezing or shortness of breath. 12/10/19   Lucrezia Starch, MD  albuterol (VENTOLIN HFA) 108 (90 Base) MCG/ACT inhaler Inhale 2 puffs into the lungs every 4 (four) hours as needed for wheezing or shortness of breath. 12/10/19   Lucrezia Starch, MD  ASPIRIN ADULT LOW STRENGTH 81 MG EC tablet TAKE 1 Tablet BY MOUTH ONCE DAILY 12/03/19   [name], MD  azithromycin (ZITHROMAX) 250 MG tablet Take 1 tablet (250 mg) once daily starting 10/20/2019 10/19/19   Jeanmarie Hubert, MD  benzonatate (TESSALON PERLES) 100 MG capsule Take 1 capsule (100 mg total) by mouth every 6 (six) hours as needed for cough. 10/19/19 10/18/20  Jeanmarie Hubert, MD  divalproex (DEPAKOTE ER) 500 MG 24 hr tablet  Take 1 tablet (500 mg total) by mouth 2 (two) times daily. Patient not taking: Reported on 10/08/2019 08/09/19   [name], MD  EPINEPHrine 0.3 mg/0.3  mL IJ SOAJ injection Inject 0.3 mLs (0.3 mg total) into the muscle as needed for anaphylaxis. Patient not taking: Reported on 10/08/2019 09/24/19   Forde Dandy, PharmD  fluticasone [facility]) 50 MCG/ACT nasal spray Place 1 spray into both nostrils daily. Patient not taking: Reported on 07/11/2019 04/26/19 04/25/20  Forde Dandy, PharmD  ipratropium-albuterol (DUONEB) 0.5-2.5 (3) MG/3ML SOLN Take 3 mLs by nebulization every 6 (six) hours as needed (wheezing or shortness of breath). 06/09/19   Spongberg, Audie Pinto, MD  lidocaine (LIDODERM) 5 % Place 1 patch onto the skin daily. Remove & Discard patch within 12 hours or as directed by MD Patient not taking: Reported on 10/08/2019 05/22/19   Neva Seat, MD  loratadine (CLARITIN) 10 MG tablet Take 1 tablet (10 mg total) by mouth daily. 04/26/19   Forde Dandy, PharmD  metoCLOPramide (REGLAN) 5 MG tablet Take 1 tablet (5 mg total) by mouth 3 (three) times daily before meals for 5 days. 07/17/19 07/22/19  [name], MD  mometasone-formoterol (DULERA) 200-5 MCG/ACT AERO Inhale 2 puffs into the lungs 2 (two) times daily. IM Program 10/19/19   Jeanmarie Hubert, MD  montelukast (SINGULAIR) 10 MG tablet Take 1 tablet (10 mg total) by mouth at bedtime. 04/26/19   Sid Falcon, MD  nicotine (NICODERM CQ - DOSED IN MG/24 HOURS) 14 mg/24hr patch Place 1 patch (14 mg total) onto the skin daily. 07/16/19   [name], MD  omeprazole (PRILOSEC) 20 MG capsule Take by mouth daily. 08/24/19   [provider]  oxyCODONE-acetaminophen (PERCOCET/ROXICET) 5-325 MG tablet Take 1 tablet by mouth every 6 (six) hours as needed for severe pain.    [provider]  pantoprazole (PROTONIX) 40 MG tablet Take 1 tablet (40 mg total) by mouth 2 (two) times daily for 5 days. 07/15/19 07/20/19  [name], MD  sertraline (ZOLOFT) 100 MG tablet Take 1 tablet (100 mg total) by mouth 2 (two) times daily. 07/24/19 08/23/19  [place], Nila Nephew D, DO  SUMAtriptan (IMITREX) 25 MG tablet Take 1 tablet (25 mg total) by mouth every 2 (two) hours as needed for migraine or headache. Patient not taking: Reported on 10/08/2019 07/24/19   [name], DO  tiotropium (SPIRIVA HANDIHALER) 18 MCG inhalation capsule Place 1 capsule (18 mcg total) into inhaler and inhale daily. 10/19/19   Jeanmarie Hubert, MD  Tiotropium Bromide Monohydrate (SPIRIVA RESPIMAT) 2.5 MCG/ACT AERS Inhale 2 puffs into the lungs daily. 05/08/19   [name], MD    Allergies    Topiramate and Tramadol  Review of Systems   Review of Systems  All other systems reviewed and are negative.   Physical Exam Updated Vital Signs BP 105/78   Pulse 80   Resp (!) 22   LMP 11/08/2014   SpO2 100%   Physical Exam Vitals and nursing note reviewed.  Constitutional:      General: She is in acute distress.     Appearance: She is well-developed.  HENT:     Head: Normocephalic and atraumatic.     Mouth/Throat:     Mouth: Mucous membranes are moist.     Comments: No pharyngeal edema or stridor Eyes:     Pupils: Pupils are equal, round, and reactive to light.  Cardiovascular:     Rate and Rhythm: Regular rhythm. Tachycardia present.     Heart sounds: Normal heart sounds. No murmur. No friction rub.  Pulmonary:     Effort: Tachypnea, accessory muscle usage  and respiratory distress present.     Breath sounds: Wheezing present. No rales.  Abdominal:     General: Bowel sounds are normal. There is no distension.     Palpations: Abdomen is soft.     Tenderness: There is no abdominal tenderness. There is no right CVA tenderness, left CVA tenderness, guarding or rebound.  Musculoskeletal:        General: No tenderness. Normal range of motion.     Right lower leg: No edema.     Left lower leg: No edema.     Comments: No  edema  Skin:    General: Skin is warm and dry.     Findings: No rash.  Neurological:     General: No focal deficit present.     Mental Status: She is alert and oriented to person, place, and time. Mental status is at baseline.     Cranial Nerves: No cranial nerve deficit.  Psychiatric:        Mood and Affect: Mood normal.        Behavior: Behavior normal.        Thought Content: Thought content normal.     ED Results / Procedures / Treatments   Labs (all labs ordered are listed, but only abnormal results are displayed) Labs Reviewed  CBC WITH DIFFERENTIAL/PLATELET - Abnormal; Notable for the following components:      Result Value   RBC 5.18 (*)    Eosinophils Absolute 0.7 (*)    All other components within normal limits  BASIC METABOLIC PANEL - Abnormal; Notable for the following components:   Creatinine, Ser 1.14 (*)    GFR calc non Af Amer 53 (*)    All other components within normal limits    EKG EKG Interpretation  Date/Time:  Wednesday March 19 2020 09:30:30 EDT Ventricular Rate:  73 PR Interval:    QRS Duration: 83 QT Interval:  412 QTC Calculation: 454 R Axis:   -39 Text Interpretation: Sinus rhythm Left axis deviation Baseline wander in lead(s) V6 No significant change since last tracing Confirmed by Blanchie Dessert ([id number]) on 03/19/2020 9:32:22 AM   Radiology DG Chest Port 1 View  Result Date: 03/19/2020 CLINICAL DATA:  Shortness of breath.  Asthma attack. EXAM: PORTABLE CHEST 1 VIEW COMPARISON:  06/08/2019 FINDINGS: Heart size is normal. Mediastinal shadows are normal. The lungs are clear. Lung volumes are within normal limits. No objective central bronchial thickening. No effusions. No bony abnormality. IMPRESSION: Normal one view chest radiograph. Electronically Signed   By: Nelson Chimes M.D.   On: 03/19/2020 09:41    Procedures Procedures (including critical care time)  Medications Ordered in ED Medications  methylPREDNISolone sodium succinate  (SOLU-MEDROL) 125 mg/2 mL injection 125 mg (has no administration in time range)  magnesium sulfate IVPB 2 g 50 mL (has no administration in time range)  albuterol (PROVENTIL,VENTOLIN) solution continuous neb (has no administration in time range)  albuterol (PROVENTIL) (2.5 MG/3ML) 0.083% nebulizer solution 5 mg (5 mg Nebulization Given 03/19/20 0921)  ipratropium (ATROVENT) nebulizer solution 0.5 mg (0.5 mg Nebulization Given 03/19/20 7782)    ED Course  I have reviewed the triage vital signs and the nursing notes.  Pertinent labs & imaging results that were available during my care of the patient were reviewed by me and considered in my medical decision making (see chart for details).    MDM Rules/Calculators/A&P  59 year old female presenting today with asthma/COPD exacerbation in extremis with tachypnea, respiratory distress but oxygen saturation remaining at 100%.  Patient does report there has been congestion and allergy symptoms despite her taking Claritin.  She was going to see her doctor today but symptoms became abruptly worse in the cafeteria.  No suspicion of anaphylaxis at this time.  Patient given albuterol/Atrovent/Solu-Medrol and magnesium.  After first nebulizer of albuterol and Atrovent patient is still wheezing diffusely and was started on an hour-long continuous neb.  10:27 AM Patient's labs including CBC, BMP without acute findings.  Chest x-ray without acute findings.  EKG is unchanged from prior.  After 1 hour-long continuous neb patient has some mild wheezing but in general appears more comfortable.  Will discontinue continuous neb and see how patient is feeling.  12:03 PM Patient is feeling much better.  Wheezing has resolved.  She is requesting to go home.  CRITICAL CARE Performed by: Nickoles Gregori Total critical care time: 30 minutes Critical care time was exclusive of separately billable procedures and treating other patients. Critical care  was necessary to treat or prevent imminent or life-threatening deterioration. Critical care was time spent personally by me on the following activities: development of treatment plan with patient and/or surrogate as well as nursing, discussions with consultants, evaluation of patient's response to treatment, examination of patient, obtaining history from patient or surrogate, ordering and performing treatments and interventions, ordering and review of laboratory studies, ordering and review of radiographic studies, pulse oximetry and re-evaluation of patient's condition.  MDM Number of Diagnoses or Management Options   Amount and/or Complexity of Data Reviewed Clinical lab tests: ordered and reviewed Tests in the radiology section of CPT: ordered and reviewed Tests in the medicine section of CPT: ordered and reviewed Decide to obtain previous medical records or to obtain history from someone other than the patient: yes Obtain history from someone other than the patient: no Review and summarize past medical records: yes Independent visualization of images, tracings, or specimens: yes  Risk of Complications, Morbidity, and/or Mortality Presenting problems: high Diagnostic procedures: moderate Management options: low  Critical Care Total time providing critical care: 30-74 minutes  Patient Progress Patient progress: improved    Final Clinical Impression(s) / ED Diagnoses Final diagnoses:  Severe persistent asthma with exacerbation    Rx / DC Orders ED Discharge Orders         Ordered    predniSONE (DELTASONE) 20 MG tablet  Daily     03/19/20 1204           Blanchie Dessert, MD 03/19/20 1204

## 2020-03-19 NOTE — ED Notes (Signed)
Pt was brought from lobby with audible wheezing, after [facility] staff bring her from cafeteria. ED provider saw her within seconds

## 2020-03-20 ENCOUNTER — Encounter: Payer: Self-pay | Admitting: Internal Medicine

## 2020-03-20 ENCOUNTER — Ambulatory Visit (INDEPENDENT_AMBULATORY_CARE_PROVIDER_SITE_OTHER): Payer: Medicaid Other | Admitting: Internal Medicine

## 2020-03-20 ENCOUNTER — Other Ambulatory Visit: Payer: Self-pay

## 2020-03-20 VITALS — BP 120/81 | HR 99 | Temp 98.2°F | Ht 63.0 in | Wt 161.3 lb

## 2020-03-20 DIAGNOSIS — Z79891 Long term (current) use of opiate analgesic: Secondary | ICD-10-CM

## 2020-03-20 DIAGNOSIS — J4489 Other specified chronic obstructive pulmonary disease: Secondary | ICD-10-CM

## 2020-03-20 DIAGNOSIS — J45901 Unspecified asthma with (acute) exacerbation: Secondary | ICD-10-CM

## 2020-03-20 DIAGNOSIS — Z91030 Bee allergy status: Secondary | ICD-10-CM

## 2020-03-20 DIAGNOSIS — M545 Low back pain: Secondary | ICD-10-CM | POA: Diagnosis not present

## 2020-03-20 DIAGNOSIS — Z79899 Other long term (current) drug therapy: Secondary | ICD-10-CM | POA: Diagnosis not present

## 2020-03-20 DIAGNOSIS — Z7951 Long term (current) use of inhaled steroids: Secondary | ICD-10-CM

## 2020-03-20 DIAGNOSIS — G8929 Other chronic pain: Secondary | ICD-10-CM | POA: Diagnosis not present

## 2020-03-20 DIAGNOSIS — Z0000 Encounter for general adult medical examination without abnormal findings: Secondary | ICD-10-CM

## 2020-03-20 DIAGNOSIS — J449 Chronic obstructive pulmonary disease, unspecified: Secondary | ICD-10-CM

## 2020-03-20 DIAGNOSIS — J4541 Moderate persistent asthma with (acute) exacerbation: Secondary | ICD-10-CM

## 2020-03-20 DIAGNOSIS — M533 Sacrococcygeal disorders, not elsewhere classified: Secondary | ICD-10-CM

## 2020-03-20 MED ORDER — LIDOCAINE 5 % EX PTCH: 1.0000 | MEDICATED_PATCH | CUTANEOUS | 1 refills | Status: DC

## 2020-03-20 MED ORDER — SPIRIVA HANDIHALER 18 MCG IN CAPS: 18.0000 ug | ORAL_CAPSULE | Freq: Every day | RESPIRATORY_TRACT | 2 refills | Status: DC

## 2020-03-20 MED ORDER — EPINEPHRINE 0.3 MG/0.3ML IJ SOAJ: 0.3000 mg | INTRAMUSCULAR | 1 refills | Status: DC | PRN

## 2020-03-20 MED ORDER — DULERA 200-5 MCG/ACT IN AERO: 2.0000 | INHALATION_SPRAY | Freq: Two times a day (BID) | RESPIRATORY_TRACT | 2 refills | Status: DC

## 2020-03-20 MED ORDER — ACETAMINOPHEN 325 MG PO TABS: 650.0000 mg | ORAL_TABLET | Freq: Four times a day (QID) | ORAL | 0 refills | Status: AC | PRN

## 2020-03-20 MED ORDER — MONTELUKAST SODIUM 10 MG PO TABS: 10.0000 mg | ORAL_TABLET | Freq: Every day | ORAL | 3 refills | Status: DC

## 2020-03-20 MED ORDER — ALBUTEROL SULFATE (2.5 MG/3ML) 0.083% IN NEBU: 2.5000 mg | INHALATION_SOLUTION | Freq: Four times a day (QID) | RESPIRATORY_TRACT | 12 refills | Status: DC | PRN

## 2020-03-20 NOTE — Progress Notes (Signed)
CC: Asthma/COPD  HPI:  [name] is a 59 y.o. female, with PMH below, who presents to the clinic for a follow up on her ED visit for her asthma/COPD. To see the management of her acute and chronic conditions, please see the A&P note.   Past Medical History:  Diagnosis Date  . Anxiety   . Arthritis    hands  . Asthma    2 sets of PFT's in 04/09 without sign variability. Last set with significant decrease in FEV1 with saline alone, suggesting Asthma but  recommended clinical corelation   . Asthma   . Bipolar 1 disorder [facility])    therapist is Caryl Asp and is followed by [employer]  . Blackout    negative work up including ESR, ANA, opthalmology referral, carotid dopplers, 2D echo, MRI and EEG.  . BREAST LUMP 03/25/2008   Annotation: bilaterally Qualifier: Diagnosis of  By: Tomasa Hosteller MD, Edmon Crape.   . 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 Bronchitis   . Chronic headache   . Chronic low back pain 08/08/2008   Qualifier: Diagnosis of  By: Redmond Pulling  MD, Mateo Flow    . Chronic pain    normal work up including TSH, RPR, B12, HIV, plain films, 2 ESR's, ANA, CK, RF along with routine CBC, CMET and UA. Further work up includes CRP, ESR, SPEP/UPEP, hepatitis erology, A1C , repeat ANA  . COPD (chronic obstructive pulmonary disease) ([place])   . COPD exacerbation ([place]) 03/31/2019  . Depression   . DUB (dysfunctional uterine bleeding)    and pelvic pain, with negative endometrial bx in 07/09 and transvaginal U/S significant for mild fibroids in 0/09.  . Emphysema of lung ([place])   . GERD (gastroesophageal reflux disease)   . Hallucinations 03/18/2008   Qualifier: Diagnosis of  By: Redmond Pulling  MD, Mateo Flow    . Hyperlipidemia   . Lower extremity edema    Neg ABI's, normal 2D echo, normal albumin  . Menorrhagia   . Ovarian cyst   . Polysubstance abuse ([place])    none since March 17,2009.  Marland Kitchen Sleep apnea    NO CPAP  . Stroke [facility])    heat stroke  07/10/19  . 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 Tubular adenoma of colon    Review of Systems:   Review of Systems  Constitutional: Negative for chills, fever and malaise/fatigue.  Eyes: Negative for blurred vision and double vision.  Respiratory: Positive for wheezing. Negative for cough and shortness of breath.   Cardiovascular: Negative for chest pain and palpitations.  Gastrointestinal: Negative for abdominal pain, constipation, diarrhea, nausea and vomiting.  Skin: Negative for itching and rash.  Neurological: Negative for dizziness and headaches.    Physical Exam:  Vitals:   03/20/20 1316  BP: 120/81  Pulse: 99  Temp: 98.2 F (36.8 C)  TempSrc: Oral  SpO2: 99%  Weight: 161 lb 4.8 oz (73.2 kg)  Height: 5' 3" (1.6 m)   Physical Exam Vitals reviewed.  Constitutional:      General: She is not in acute distress.    Appearance: Normal appearance.  HENT:     Head: Normocephalic and atraumatic.  Cardiovascular:     Rate and Rhythm: Normal rate and regular rhythm.     Pulses: Normal pulses.     Heart sounds: Normal heart sounds. No murmur. No friction rub. No gallop.   Pulmonary:     Effort:  Pulmonary effort is normal.     Breath sounds: No stridor. Wheezing present. No rhonchi or rales.     Comments: Faint Wheezing auscultated in the poster RUL, wheezing also present bilaterally along the anterior aspect of the chest wall.  Chest:     Chest wall: No tenderness.  Abdominal:     General: Abdomen is flat. Bowel sounds are normal.     Palpations: Abdomen is soft.  Psychiatric:        Mood and Affect: Mood normal.        Behavior: Behavior normal.     Assessment & Plan:   See Encounters Tab for problem based charting.  Patient discussed with Dr. Mullen 

## 2020-03-20 NOTE — Patient Instructions (Addendum)
To Ms. Szydlo,  It was a pleasure meeting your today. Today we discussed your recent visit to the emergency department, and medication refills. Today [facility] will [facility], epi pen, as well as your lidocaine patches. Please follow up with your primary care provider to address your oxycodone. Have a good day!  Sincerely,  Maudie Mercury, MD

## 2020-03-21 ENCOUNTER — Encounter: Payer: Self-pay | Admitting: Internal Medicine

## 2020-03-21 ENCOUNTER — Telehealth: Payer: Self-pay | Admitting: Internal Medicine

## 2020-03-21 DIAGNOSIS — Z91030 Bee allergy status: Secondary | ICD-10-CM | POA: Insufficient documentation

## 2020-03-21 MED ORDER — DICLOFENAC SODIUM 1 % EX GEL: 2.0000 g | Freq: Four times a day (QID) | CUTANEOUS | 2 refills | Status: DC

## 2020-03-21 MED FILL — MONTELUKAST SOD 10 MG TAB: 10 | 30 days supply | Qty: 30 | Fill #0

## 2020-03-21 MED FILL — DICLOFENAC SODIUM 1 % GEL: 1 | 13 days supply | Qty: 100 | Fill #0

## 2020-03-21 MED FILL — EPINEPHRINE 0.3 MG AUTO-INJ: 0.3 | 2 days supply | Qty: 2 | Fill #0

## 2020-03-21 MED FILL — DULERA 200 MCG/5 MCG INH: 200-5 | 30 days supply | Qty: 13 | Fill #0

## 2020-03-21 MED FILL — SPIRIVA 18 MCG CP-HANDIHALE: 18 | 30 days supply | Qty: 30 | Fill #0

## 2020-03-21 NOTE — Assessment & Plan Note (Signed)
Patient presents to the [facility] after a visit at the [facility] for her Asthma/COPD. Patient states that she has been out of her medications, and scheduled an appointment for refills on her medications and wheezing she has noticed over the last the last 3 days. She was coming to her appointment when she had severe [facility] and went to the ED. She was treated with a steroid taper and nebulizer treatment in the ED. She states that she is breathing much better than yesterday, but is still "a little wheezy."   Assessment:  Patient presents with stable vitals, and minimal wheezing noted on physical examination. She is also out of her current inhalers.   Plan:  - Prescription for albuterol, dulera, singulair, and spiriva sent to the [facility] outpatient pharmacy.  - Instructed to finish steroid taper.  - Instructed to come back to ED if symptoms worsen.

## 2020-03-21 NOTE — Assessment & Plan Note (Signed)
Patient with chronic low back pain presents for refill on oxycodone. Patient has not been prescribed medications for 2 years. Discussed needing to follow with PCP to fulfill pain contract. Patient voiced understanding.  P: - Tylenol 325 mg prescribed - lidocaine patch ordered.   Addendum: Lidocaine not covered by insurance. Diclofenac gel ordered.

## 2020-03-21 NOTE — Telephone Encounter (Signed)
The lidocaine patches are not covered by medicaid they are appr 400.00, [facility]ann at [facility] suggest diclofenac gel.

## 2020-03-21 NOTE — Assessment & Plan Note (Signed)
Patient requesting refill on epi pen after being stung by a bee 2 months ago.  P:  -refill epipen

## 2020-03-23 ENCOUNTER — Other Ambulatory Visit: Payer: Self-pay

## 2020-03-23 ENCOUNTER — Emergency Department (HOSPITAL_COMMUNITY)
Admission: EM | Admit: 2020-03-23 | Discharge: 2020-03-23 | Disposition: A | Discharge status: Home / Self Care | Payer: Medicaid Other | Attending: Emergency Medicine | Admitting: Emergency Medicine

## 2020-03-23 ENCOUNTER — Emergency Department (HOSPITAL_COMMUNITY): Payer: Medicaid Other

## 2020-03-23 ENCOUNTER — Encounter (HOSPITAL_COMMUNITY): Payer: Self-pay | Admitting: Emergency Medicine

## 2020-03-23 DIAGNOSIS — S99922A Unspecified injury of left foot, initial encounter: Secondary | ICD-10-CM | POA: Diagnosis present

## 2020-03-23 DIAGNOSIS — Z760 Encounter for issue of repeat prescription: Secondary | ICD-10-CM | POA: Insufficient documentation

## 2020-03-23 DIAGNOSIS — W2203XA Walked into furniture, initial encounter: Secondary | ICD-10-CM | POA: Diagnosis not present

## 2020-03-23 DIAGNOSIS — J449 Chronic obstructive pulmonary disease, unspecified: Secondary | ICD-10-CM | POA: Diagnosis not present

## 2020-03-23 DIAGNOSIS — Y999 Unspecified external cause status: Secondary | ICD-10-CM | POA: Diagnosis not present

## 2020-03-23 DIAGNOSIS — Z79899 Other long term (current) drug therapy: Secondary | ICD-10-CM | POA: Insufficient documentation

## 2020-03-23 DIAGNOSIS — S92515A Nondisplaced fracture of proximal phalanx of left lesser toe(s), initial encounter for closed fracture: Secondary | ICD-10-CM | POA: Diagnosis not present

## 2020-03-23 DIAGNOSIS — Y929 Unspecified place or not applicable: Secondary | ICD-10-CM | POA: Insufficient documentation

## 2020-03-23 DIAGNOSIS — F17210 Nicotine dependence, cigarettes, uncomplicated: Secondary | ICD-10-CM | POA: Insufficient documentation

## 2020-03-23 DIAGNOSIS — Y939 Activity, unspecified: Secondary | ICD-10-CM | POA: Diagnosis not present

## 2020-03-23 DIAGNOSIS — J45909 Unspecified asthma, uncomplicated: Secondary | ICD-10-CM | POA: Diagnosis not present

## 2020-03-23 MED ORDER — HYDROCODONE-ACETAMINOPHEN 5-325 MG PO TABS
1.0000 | ORAL_TABLET | Freq: Once | ORAL | Status: AC
  Administered 2020-03-23: 1 via ORAL
  Filled 2020-03-23: qty 1

## 2020-03-23 MED ORDER — HYDROCODONE-ACETAMINOPHEN 5-325 MG PO TABS: 1.0000 | ORAL_TABLET | Freq: Four times a day (QID) | ORAL | 0 refills | Status: DC | PRN

## 2020-03-23 MED ORDER — ALBUTEROL SULFATE HFA 108 (90 BASE) MCG/ACT IN AERS
1.0000 | INHALATION_SPRAY | Freq: Once | RESPIRATORY_TRACT | Status: AC
  Administered 2020-03-23: 1 via RESPIRATORY_TRACT
  Filled 2020-03-23: qty 6.7

## 2020-03-23 NOTE — Discharge Instructions (Signed)
Please read and follow all provided instructions.  You have been seen today after an injury to your left 4th toe Your xray shows that you have a fracture We have taped your toes together and given you a post op shoe to help with healing, please continue to tape your toes together at home until you have followed up with your primary care provider or orthopedics.   Home care instructions: -- *PRICE in the first 24-48 hours after injury: Protect with splint Rest Ice- Do not apply ice pack directly to your splint place towel or similar between your splint and ice/ice pack. Apply ice for 20 min, then remove for 40 min while awake Compression- splint Elevate affected extremity above the level of your heart when not walking around for the first 24-48 hours   Medications:   -Norco-this is a narcotic/controlled substance medication that has potential addicting qualities.  We recommend that you take 1 tablet every 6 hours as needed for severe pain.  Do not drive or operate heavy machinery when taking this medicine as it can be sedating. Do not drink alcohol or take other sedating medications when taking this medicine for safety reasons.  Keep this out of reach of small children.  Please be aware this medicine has Tylenol in it (325 mg/tab) do not exceed the maximum dose of Tylenol in a day per over the counter recommendations should you decide to supplement with Tylenol over the counter.   - Inhaler- refill- use 1-2 puffs every 4-6 hours as needed for shortness of breath or wheezing.   We have prescribed you new medication(s) today. Discuss the medications prescribed today with your pharmacist as they can have adverse effects and interactions with your other medicines including over the counter and prescribed medications. Seek medical evaluation if you start to experience new or abnormal symptoms after taking one of these medicines, seek care immediately if you start to experience difficulty breathing,  feeling of your throat closing, facial swelling, or rash as these could be indications of a more serious allergic reaction   Follow-up instructions: Please follow-up with the orthopedic surgeon in your discharge instructions or your primary care provider within 1 week for re-evaluation.   Return instructions:  Please return if your digits or extremity are numb or tingling, appear gray or blue, or you have severe pain (also elevate the extremity and loosen splint or wrap if you were given one) Please return if you have redness or fevers.  Please return if you have trouble breathing, chest pain, or any other concerns.  Please return to the Emergency Department if you experience worsening symptoms.  Please return if you have any other emergent concerns. Additional Information:  Your vital signs today were: BP (!) 139/101 (BP Location: Right Arm)    Pulse 84    Temp 98.8 F (37.1 C) (Oral)    Resp 16    LMP 11/08/2014    SpO2 94%  If your blood pressure (BP) was elevated above 135/85 this visit, please have this repeated by your doctor within one month. ---------------

## 2020-03-23 NOTE — ED Notes (Signed)
Pt transferred to XRAY 

## 2020-03-23 NOTE — ED Provider Notes (Signed)
[facility] DEPT Provider Note   CSN: [id number] Arrival date & time: 03/23/20  1542     History Chief Complaint  Patient presents with  . Toe Injury    Kathleen [name] is a 59 y.o. female with a history of COPD, bipolar disorder, hyperlipidemia, sleep apnea, prior stroke, and chronic pain who presents to the ED with complaints of L 4th toe injury that occurred yesterday. Patient states that she accidentally struck her left foot, especially the fourth toe, on a dresser.  She states since then she has had constant pain, worse with weightbearing, palpation, and movement.  Initially had some swelling but this is improved.  She has noted some associated bruising.  Denies any other areas of injury.  Denies numbness, tingling, or weakness.  Patient is also requesting a rescue inhaler, she is currently on steroids for an asthma exacerbation, she states she is having some improvement, but she ran out of her rescue inhaler, her pharmacy is not open on the weekends and she is requesting for refill here today.  She denies any current shortness of breath or wheezing.   HPI     Past Medical History:  Diagnosis Date  . Anxiety   . Arthritis    hands  . Asthma    2 sets of PFT's in 04/09 without sign variability. Last set with significant decrease in FEV1 with saline alone, suggesting Asthma but  recommended clinical corelation   . Asthma   . Bipolar 1 disorder [facility])    therapist is Caryl Asp and is followed by [employer]  . Blackout    negative work up including ESR, ANA, opthalmology referral, carotid dopplers, 2D echo, MRI and EEG.  . BREAST LUMP 03/25/2008   Annotation: bilaterally Qualifier: Diagnosis of  By: Tomasa Hosteller MD, Edmon Crape.   . 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 Bronchitis   . Chronic headache   . Chronic low back pain 08/08/2008   Qualifier: Diagnosis of  By: Redmond Pulling  MD, Mateo Flow    . Chronic  pain    normal work up including TSH, RPR, B12, HIV, plain films, 2 ESR's, ANA, CK, RF along with routine CBC, CMET and UA. Further work up includes CRP, ESR, SPEP/UPEP, hepatitis erology, A1C , repeat ANA  . COPD (chronic obstructive pulmonary disease) ([place])   . COPD exacerbation ([place]) 03/31/2019  . Depression   . DUB (dysfunctional uterine bleeding)    and pelvic pain, with negative endometrial bx in 07/09 and transvaginal U/S significant for mild fibroids in 0/09.  . Emphysema of lung ([place])   . GERD (gastroesophageal reflux disease)   . Hallucinations 03/18/2008   Qualifier: Diagnosis of  By: Redmond Pulling  MD, Mateo Flow    . Hyperlipidemia   . Lower extremity edema    Neg ABI's, normal 2D echo, normal albumin  . Menorrhagia   . Ovarian cyst   . Polysubstance abuse ([place])    none since March 17,2009.  Marland Kitchen Sleep apnea    NO CPAP  . 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 Tubular adenoma of colon     Patient Active Problem List   Diagnosis Date Noted  . 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 Daytime somnolence 04/06/2019  . Numbness and tingling in right hand 03/09/2019  . Lumbar radiculopathy 07/26/2016  . Depression 09/19/2015  . Preventative health care  06/18/2014  . 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 BIPOLAR DISORDER UNSPECIFIED 01/21/2010  . GERD 01/21/2010  . Generalized anxiety disorder 03/14/2008  . DRUG ABUSE 03/14/2008  . Migraine variant 03/14/2008    Past Surgical History:  Procedure Laterality Date  . CESAREAN SECTION    . CESAREAN SECTION    . COLONOSCOPY    . HERNIA REPAIR    . Left partial oophorectomy    . OOPHORECTOMY     1/2 ovary removed  . POLYPECTOMY    . VAGINA SURGERY     mesh     OB History    Gravida  3   Para  2   Term  2   Preterm  0   AB  1   Living  2     SAB  0   TAB  1   Ectopic  0   Multiple      Live Births  2           Family History  Problem Relation Age of  Onset  . Colon cancer Mother 81  . Breast cancer Mother 78  . Rectal cancer Mother   . Diabetes Father   . Hypertension Father   . Kidney disease Father   . Colon cancer Father 60  . Prostate cancer Father   . Cancer Father   . Kidney disease Sister   . Cancer Brother   . Breast cancer Maternal Grandmother 103  . Esophageal cancer Neg Hx   . Stomach cancer Neg Hx     Social History   Tobacco Use  . Smoking status: Current Some Day Smoker    Packs/day: 0.25    Years: 30.00    Pack years: 7.50    Types: Cigarettes    Last attempt to quit: 12/28/2016    Years since quitting: 3.2  . Smokeless tobacco: Never Used  . Tobacco comment: Occ. Wants Nicotine Patches   Substance Use Topics  . Alcohol use: Yes    Alcohol/week: 0.0 standard drinks    Comment: rarely  . Drug use: No    Types: Marijuana    Comment: former    Home Medications Prior to Admission medications   Medication Sig Start Date End Date Taking? Authorizing Provider  acetaminophen (TYLENOL) 325 MG tablet Take 2 tablets (650 mg total) by mouth every 6 (six) hours as needed for moderate pain. 03/20/20 03/20/21  Maudie Mercury, MD  albuterol (PROVENTIL) (2.5 MG/3ML) 0.083% nebulizer solution Take 3 mLs (2.5 mg total) by nebulization every 6 (six) hours as needed for wheezing or shortness of breath. 03/20/20   Maudie Mercury, MD  albuterol (VENTOLIN HFA) 108 (90 Base) MCG/ACT inhaler Inhale 2 puffs into the lungs every 4 (four) hours as needed for wheezing or shortness of breath. 12/10/19   Lucrezia Starch, MD  ASPIRIN ADULT LOW STRENGTH 81 MG EC tablet TAKE 1 Tablet BY MOUTH ONCE DAILY 12/03/19   [name], MD  azithromycin (ZITHROMAX) 250 MG tablet Take 1 tablet (250 mg) once daily starting 10/20/2019 Patient not taking: Reported on 03/19/2020 10/19/19   Jeanmarie Hubert, MD  benzonatate (TESSALON PERLES) 100 MG capsule Take 1 capsule (100 mg total) by mouth every 6 (six) hours as needed for cough. Patient not  taking: Reported on 03/19/2020 10/19/19 10/18/20  Jeanmarie Hubert, MD  diclofenac Sodium (VOLTAREN) 1 % GEL Apply 2 g topically 4 (four) times daily. 03/21/20   Maudie Mercury, MD  divalproex (DEPAKOTE ER) 500 MG 24  hr tablet Take 1 tablet (500 mg total) by mouth 2 (two) times daily. Patient not taking: Reported on 10/08/2019 08/09/19   [name], MD  EPINEPHrine 0.3 mg/0.3 mL IJ SOAJ injection Inject 0.3 mLs (0.3 mg total) into the muscle as needed for anaphylaxis. 03/20/20   Maudie Mercury, MD  fluticasone (FLONASE) 50 MCG/ACT nasal spray Place 1 spray into both nostrils daily. Patient not taking: Reported on 07/11/2019 04/26/19 04/25/20  Forde Dandy, PharmD  ipratropium-albuterol (DUONEB) 0.5-2.5 (3) MG/3ML SOLN Take 3 mLs by nebulization every 6 (six) hours as needed (wheezing or shortness of breath). Patient not taking: Reported on 03/19/2020 06/09/19   [name], MD  lidocaine (LIDODERM) 5 % Place 1 patch onto the skin daily. Remove & Discard patch within 12 hours or as directed by MD 03/20/20   Maudie Mercury, MD  loratadine (CLARITIN) 10 MG tablet Take 1 tablet (10 mg total) by mouth daily. 04/26/19   Forde Dandy, PharmD  metoCLOPramide (REGLAN) 5 MG tablet Take 1 tablet (5 mg total) by mouth 3 (three) times daily before meals for 5 days. Patient not taking: Reported on 03/19/2020 07/17/19 03/19/20  [name], MD  mometasone-formoterol [facility]) 200-5 MCG/ACT AERO Inhale 2 puffs into the lungs 2 (two) times daily. IM Program 03/20/20   Maudie Mercury, MD  montelukast (SINGULAIR) 10 MG tablet Take 1 tablet (10 mg total) by mouth at bedtime. 03/20/20   Maudie Mercury, MD  nicotine (NICODERM CQ - DOSED IN MG/24 HOURS) 14 mg/24hr patch Place 1 patch (14 mg total) onto the skin daily. Patient not taking: Reported on 03/19/2020 07/16/19   [name], MD  omeprazole (PRILOSEC) 20 MG capsule Take by mouth daily. 08/24/19   [provider]  oxyCODONE-acetaminophen  (PERCOCET/ROXICET) 5-325 MG tablet Take 1 tablet by mouth every 6 (six) hours as needed for severe pain.    [provider]  pantoprazole (PROTONIX) 40 MG tablet Take 1 tablet (40 mg total) by mouth 2 (two) times daily for 5 days. 07/15/19 07/20/19  [name], MD  predniSONE (DELTASONE) 20 MG tablet Take 2 tablets (40 mg total) by mouth daily. 03/19/20   Blanchie Dessert, MD  sertraline (ZOLOFT) 100 MG tablet Take 1 tablet (100 mg total) by mouth 2 (two) times daily. 07/24/19 08/23/19  [place], Nila Nephew D, DO  SUMAtriptan (IMITREX) 25 MG tablet Take 1 tablet (25 mg total) by mouth every 2 (two) hours as needed for migraine or headache. Patient not taking: Reported on 10/08/2019 07/24/19   [name], DO  tiotropium (SPIRIVA HANDIHALER) 18 MCG inhalation capsule Place 1 capsule (18 mcg total) into inhaler and inhale daily. 03/20/20   Maudie Mercury, MD  Tiotropium Bromide Monohydrate (SPIRIVA RESPIMAT) 2.5 MCG/ACT AERS Inhale 2 puffs into the lungs daily. Patient not taking: Reported on 03/19/2020 05/08/19   [name], MD    Allergies    Topiramate and Tramadol  Review of Systems   Review of Systems  Constitutional: Negative for chills and fever.  Respiratory: Negative for shortness of breath and wheezing.   Cardiovascular: Negative for chest pain.  Gastrointestinal: Negative for abdominal pain, nausea and vomiting.  Musculoskeletal: Positive for arthralgias and joint swelling.  Skin: Positive for color change. Negative for wound.  Neurological: Negative for weakness and numbness.  All other systems reviewed and are negative.   Physical Exam Updated Vital Signs BP (!) 139/101 (BP Location: Right Arm)   Pulse 84   Temp 98.8 F (37.1 C) (  Oral)   Resp 16   LMP 11/08/2014   SpO2 94%   Physical Exam Vitals and nursing note reviewed.  Constitutional:      General: She is not in acute distress.    Appearance: She is well-developed. She is not ill-appearing or  toxic-appearing.  HENT:     Head: Normocephalic and atraumatic.  Eyes:     General:        Right eye: No discharge.        Left eye: No discharge.     Conjunctiva/sclera: Conjunctivae normal.  Cardiovascular:     Rate and Rhythm: Normal rate and regular rhythm.     Pulses:          Dorsalis pedis pulses are 2+ on the right side and 2+ on the left side.       Posterior tibial pulses are 2+ on the right side and 2+ on the left side.  Pulmonary:     Effort: Pulmonary effort is normal. No respiratory distress.     Breath sounds: Normal breath sounds. No wheezing, rhonchi or rales.  Abdominal:     General: There is no distension.     Palpations: Abdomen is soft.     Tenderness: There is no abdominal tenderness.  Musculoskeletal:     Cervical back: Neck supple.     Comments: Lower extremities: Bruising noted to the dorsal aspect of the base of the L 4th toe. No open wounds.  No swelling. Able to move all digits. Intact AROM throughout LEs. Tender to palpation to diffuse forefoot with tenderness throughout the 4th digit. Otherwise nontender nails are painted limiting evaluation of nailbed.   Skin:    General: Skin is warm and dry.     Capillary Refill: Capillary refill takes less than 2 seconds.     Findings: No rash.  Neurological:     Mental Status: She is alert.     Comments: Alert. Clear speech. Sensation grossly intact to bilateral lower extremities. 5/5 strength with plantar/dorsiflexion bilaterally. Antalgic gait, ambulatory.   Psychiatric:        Mood and Affect: Mood normal.        Behavior: Behavior normal.    ED Results / Procedures / Treatments   Labs (all labs ordered are listed, but only abnormal results are displayed) Labs Reviewed - No data to display  EKG None  Radiology DG Foot Complete Left  Result Date: 03/23/2020 CLINICAL DATA:  59 year old female with trauma to the left foot. EXAM: LEFT FOOT - COMPLETE 3+ VIEW COMPARISON:  None. FINDINGS: There is a  nondisplaced oblique fracture of the proximal phalanx of the fourth digit. No other acute fracture identified. The bones are well mineralized. No arthritic changes. The soft tissues are unremarkable. No radiopaque foreign object or soft tissue gas. IMPRESSION: Nondisplaced oblique fracture of the proximal phalanx of the fourth digit. Electronically Signed   By: Anner Crete M.D.   On: 03/23/2020 16:49    Procedures Procedures (including critical care time)  Medications Ordered in ED Medications  albuterol (VENTOLIN HFA) 108 (90 Base) MCG/ACT inhaler 1 puff (has no administration in time range)  HYDROcodone-acetaminophen (NORCO/VICODIN) 5-325 MG per tablet 1 tablet (has no administration in time range)    ED Course  I have reviewed the triage vital signs and the nursing notes.  Pertinent labs & imaging results that were available during my care of the patient were reviewed by me and considered in my medical decision making (see chart for  details).    MDM Rules/Calculators/A&P                      Patient presents to the ED with complaints of L 4th toe pain s/p injury yesterday, also requesting inhaler refill. She is nontoxic, resting comfortably, vitals WNL with the exception of elevated BP- doubt HTN emergency.   Additional history obtained:  Additional history obtained from chart review  Imaging Studies ordered:  Ordered imaging studies which included left foot xray, I independently visualized and interpreted imaging which showed Nondisplaced oblique fracture of the proximal phalanx of the fourth digit.  Medicines ordered:  I ordered medication Norco for pain control.  I ordered buddy taping and post op shoe for 4th toe fracture.  I ordered albuterol inhaler as requested- currently lungs are clear, she is asymptomatic, she is not in respiratory distress, receiving steroids.    NVI distally to fracture- buddy taped with post op shoe. Discussed PRICE. Will provide a few tablets  of norco to take as needed for pain as patient's last creatinine was elevated on chart review therefore avoiding NSAIDs. [place] Controlled Substance reporting System queried  I discussed results, treatment plan, need for follow-up, and return precautions with the patient. Provided opportunity for questions, patient confirmed understanding and is in agreement with plan.   Portions of this note were generated with [employer]. Dictation errors may occur despite best attempts at proofreading.  Final Clinical Impression(s) / ED Diagnoses Final diagnoses:  Closed nondisplaced fracture of proximal phalanx of lesser toe of left foot, initial encounter  Medication refill    Rx / DC Orders ED Discharge Orders         Ordered    HYDROcodone-acetaminophen (NORCO/VICODIN) 5-325 MG tablet  Every 6 hours PRN     03/23/20 1710           Wen Munford, [place], PA-C 03/23/20 1710    Drenda Freeze, MD 03/27/20 1615

## 2020-03-23 NOTE — ED Triage Notes (Signed)
Patient here from home reporting 4th left toe pain after hitting it on dresser. Also states that she needs a rescue inhaler.

## 2020-03-26 NOTE — Telephone Encounter (Signed)
diclofenac gel was ordered on 4/9 per Dr Gilford Rile.

## 2020-03-27 NOTE — Progress Notes (Signed)
Internal Medicine Clinic Attending  Case discussed with Dr. Winters at the time of the visit.  We reviewed the resident's history and exam and pertinent patient test results.  I agree with the assessment, diagnosis, and plan of care documented in the resident's note.  

## 2020-04-01 ENCOUNTER — Telehealth: Payer: Self-pay | Admitting: *Deleted

## 2020-04-01 NOTE — Telephone Encounter (Signed)
Information was sent to [employer] for PA for Lidoderm 5% Patches.  Approved 03/26/2020 thru 07/24/2020.  [place] [id number].  I [id number].  Sander Nephew, RN 04/01/2020 2:10 PM.

## 2020-04-17 ENCOUNTER — Encounter: Payer: Self-pay | Admitting: *Deleted

## 2020-05-08 ENCOUNTER — Other Ambulatory Visit: Payer: Self-pay | Admitting: *Deleted

## 2020-05-08 DIAGNOSIS — J45901 Unspecified asthma with (acute) exacerbation: Secondary | ICD-10-CM

## 2020-05-08 DIAGNOSIS — J449 Chronic obstructive pulmonary disease, unspecified: Secondary | ICD-10-CM

## 2020-05-08 MED ORDER — ALBUTEROL SULFATE HFA 108 (90 BASE) MCG/ACT IN AERS: 2.0000 | INHALATION_SPRAY | RESPIRATORY_TRACT | 1 refills | Status: DC | PRN

## 2020-05-19 IMAGING — CR CHEST - 2 VIEW
2 series · 2 of 2 positions shown · non-contrast
Comparison: 05/25/2018

CLINICAL DATA: Fever, cough

EXAM:
CHEST - 2 VIEW

[w chest pa]
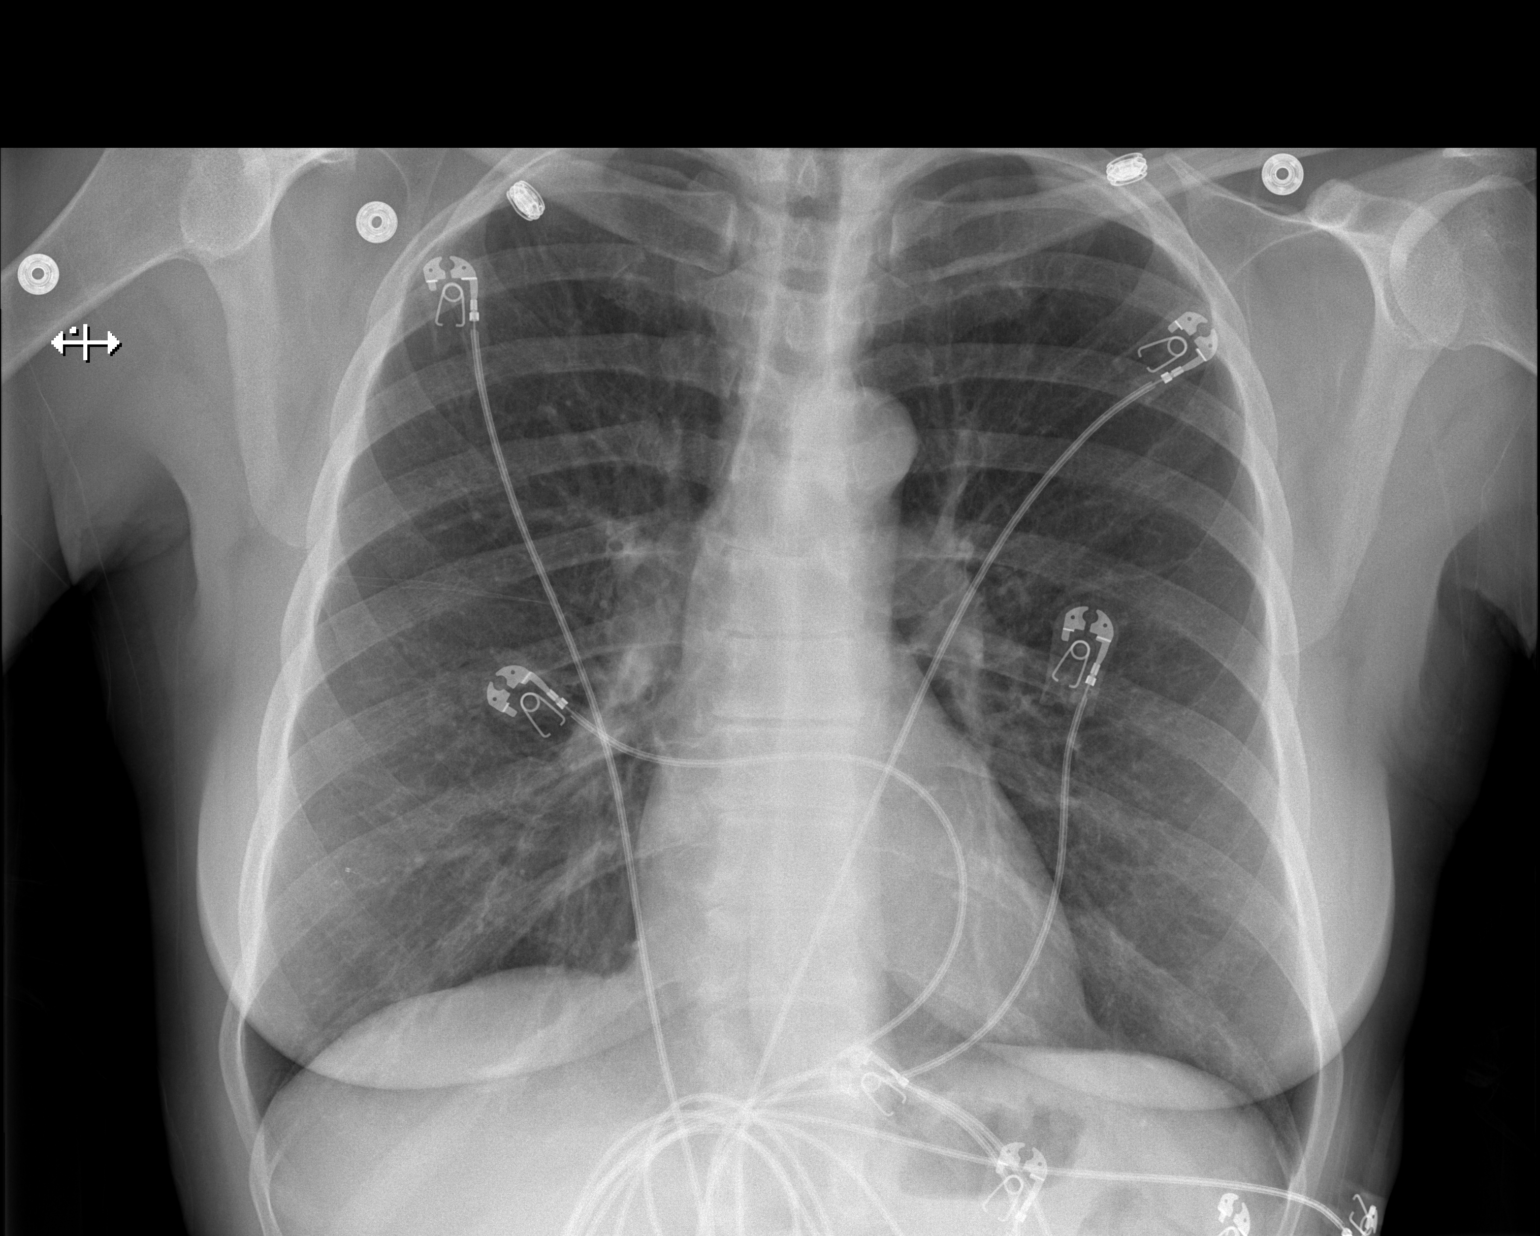

[w chest lat]
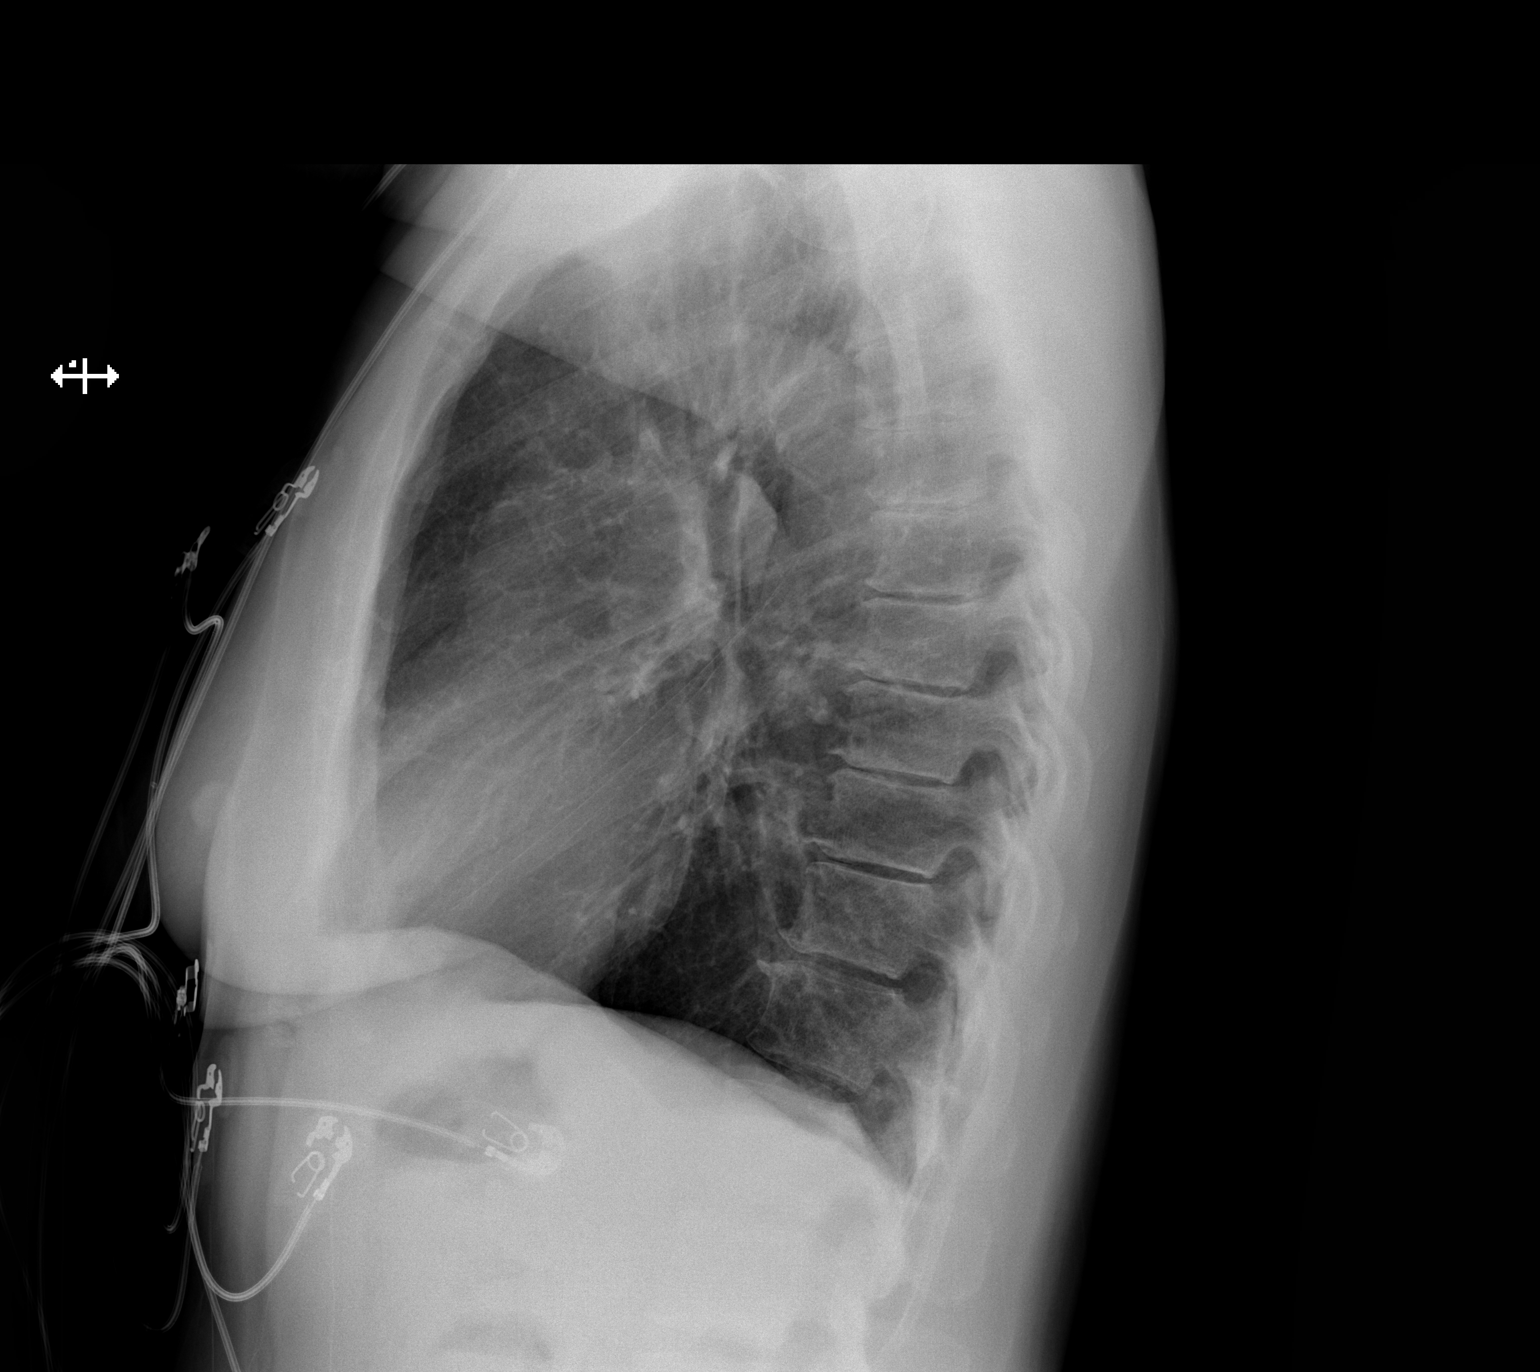

[2 of 2 positions shown; findings below may reference images not displayed]

FINDINGS: The heart size and mediastinal contours are within normal limits.
Both lungs are clear. Disc degenerative disease of the thoracic
spine.
IMPRESSION: No acute abnormality of the lungs.  No focal airspace opacity.

## 2020-05-22 ENCOUNTER — Emergency Department (HOSPITAL_COMMUNITY)
Admission: EM | Admit: 2020-05-22 | Discharge: 2020-05-22 | Disposition: A | Discharge status: Home / Self Care | Payer: Medicaid Other | Attending: Emergency Medicine | Admitting: Emergency Medicine

## 2020-05-22 ENCOUNTER — Other Ambulatory Visit: Payer: Self-pay

## 2020-05-22 ENCOUNTER — Encounter (HOSPITAL_COMMUNITY): Payer: Self-pay | Admitting: Emergency Medicine

## 2020-05-22 DIAGNOSIS — Z8673 Personal history of transient ischemic attack (TIA), and cerebral infarction without residual deficits: Secondary | ICD-10-CM | POA: Insufficient documentation

## 2020-05-22 DIAGNOSIS — F17210 Nicotine dependence, cigarettes, uncomplicated: Secondary | ICD-10-CM | POA: Insufficient documentation

## 2020-05-22 DIAGNOSIS — Z79899 Other long term (current) drug therapy: Secondary | ICD-10-CM | POA: Diagnosis not present

## 2020-05-22 DIAGNOSIS — J4520 Mild intermittent asthma, uncomplicated: Secondary | ICD-10-CM

## 2020-05-22 DIAGNOSIS — Z7982 Long term (current) use of aspirin: Secondary | ICD-10-CM | POA: Diagnosis not present

## 2020-05-22 DIAGNOSIS — R062 Wheezing: Secondary | ICD-10-CM | POA: Diagnosis present

## 2020-05-22 DIAGNOSIS — R05 Cough: Secondary | ICD-10-CM | POA: Diagnosis not present

## 2020-05-22 DIAGNOSIS — J449 Chronic obstructive pulmonary disease, unspecified: Secondary | ICD-10-CM | POA: Diagnosis not present

## 2020-05-22 MED ORDER — ALBUTEROL SULFATE (2.5 MG/3ML) 0.083% IN NEBU: 2.5000 mg | INHALATION_SOLUTION | Freq: Four times a day (QID) | RESPIRATORY_TRACT | 12 refills | Status: DC | PRN

## 2020-05-22 MED ORDER — ALBUTEROL SULFATE HFA 108 (90 BASE) MCG/ACT IN AERS
2.0000 | INHALATION_SPRAY | Freq: Once | RESPIRATORY_TRACT | Status: AC
  Administered 2020-05-22: 2 via RESPIRATORY_TRACT
  Filled 2020-05-22: qty 6.7

## 2020-05-22 MED ORDER — ALBUTEROL SULFATE HFA 108 (90 BASE) MCG/ACT IN AERS
8.0000 | INHALATION_SPRAY | Freq: Once | RESPIRATORY_TRACT | Status: AC
  Administered 2020-05-22: 8 via RESPIRATORY_TRACT
  Filled 2020-05-22: qty 6.7

## 2020-05-22 MED ORDER — IPRATROPIUM BROMIDE HFA 17 MCG/ACT IN AERS
2.0000 | INHALATION_SPRAY | Freq: Once | RESPIRATORY_TRACT | Status: AC
  Administered 2020-05-22: 2 via RESPIRATORY_TRACT
  Filled 2020-05-22: qty 12.9

## 2020-05-22 MED ORDER — PREDNISONE 20 MG PO TABS
60.0000 mg | ORAL_TABLET | Freq: Every day | ORAL | 0 refills | Status: AC
Start: 2020-05-22 — End: 2020-05-27

## 2020-05-22 MED ORDER — PREDNISONE 20 MG PO TABS
60.0000 mg | ORAL_TABLET | Freq: Once | ORAL | Status: AC
  Administered 2020-05-22: 60 mg via ORAL
  Filled 2020-05-22: qty 3

## 2020-05-22 NOTE — ED Provider Notes (Signed)
[facility] DEPT Provider Note   CSN: [id number] Arrival date & time: 05/22/20  1156     History Chief Complaint  Patient presents with  . Wheezing    Kathleen [name] is a 59 y.o. female.  The history is provided by the patient.  Wheezing Severity:  Moderate Severity compared to prior episodes:  Similar Onset quality:  Gradual Duration:  2 days Timing:  Intermittent Progression:  Waxing and waning Chronicity:  Recurrent Relieved by:  Home nebulizer and beta-agonist inhaler Worsened by:  Activity Associated symptoms: cough   Associated symptoms: no chest pain, no chest tightness, no ear pain, no fatigue, no fever, no foot swelling, no headaches, no orthopnea, no PND, no rash, no shortness of breath, no sore throat, no sputum production, no stridor and no swollen glands        Past Medical History:  Diagnosis Date  . Anxiety   . Arthritis    hands  . Asthma    2 sets of PFT's in 04/09 without sign variability. Last set with significant decrease in FEV1 with saline alone, suggesting Asthma but  recommended clinical corelation   . Asthma   . Bipolar 1 disorder [facility])    therapist is Caryl Asp and is followed by [employer]  . Blackout    negative work up including ESR, ANA, opthalmology referral, carotid dopplers, 2D echo, MRI and EEG.  . BREAST LUMP 03/25/2008   Annotation: bilaterally Qualifier: Diagnosis of  By: Tomasa Hosteller MD, Edmon Crape.   . 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 Bronchitis   . Chronic headache   . Chronic low back pain 08/08/2008   Qualifier: Diagnosis of  By: Redmond Pulling  MD, Mateo Flow    . Chronic pain    normal work up including TSH, RPR, B12, HIV, plain films, 2 ESR's, ANA, CK, RF along with routine CBC, CMET and UA. Further work up includes CRP, ESR, SPEP/UPEP, hepatitis erology, A1C , repeat ANA  . COPD (chronic obstructive pulmonary disease) ([place])   . COPD exacerbation  ([place]) 03/31/2019  . Depression   . DUB (dysfunctional uterine bleeding)    and pelvic pain, with negative endometrial bx in 07/09 and transvaginal U/S significant for mild fibroids in 0/09.  . Emphysema of lung ([place])   . GERD (gastroesophageal reflux disease)   . Hallucinations 03/18/2008   Qualifier: Diagnosis of  By: Redmond Pulling  MD, Mateo Flow    . Hyperlipidemia   . Lower extremity edema    Neg ABI's, normal 2D echo, normal albumin  . Menorrhagia   . Ovarian cyst   . Polysubstance abuse ([place])    none since March 17,2009.  Marland Kitchen Sleep apnea    NO CPAP  . 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 Tubular adenoma of colon     Patient Active Problem List   Diagnosis Date Noted  . 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 Daytime somnolence 04/06/2019  . Numbness and tingling in right hand 03/09/2019  . Lumbar radiculopathy 07/26/2016  . Depression 09/19/2015  . Preventative health care 06/18/2014  . 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 BIPOLAR DISORDER UNSPECIFIED 01/21/2010  . GERD 01/21/2010  . Generalized anxiety disorder 03/14/2008  . DRUG ABUSE 03/14/2008  . Migraine variant 03/14/2008    Past Surgical History:  Procedure Laterality Date  . CESAREAN SECTION    .  CESAREAN SECTION    . COLONOSCOPY    . HERNIA REPAIR    . Left partial oophorectomy    . OOPHORECTOMY     1/2 ovary removed  . POLYPECTOMY    . VAGINA SURGERY     mesh     OB History    Gravida  3   Para  2   Term  2   Preterm  0   AB  1   Living  2     SAB  0   TAB  1   Ectopic  0   Multiple      Live Births  2           Family History  Problem Relation Age of Onset  . Colon cancer Mother 34  . Breast cancer Mother 52  . Rectal cancer Mother   . Diabetes Father   . Hypertension Father   . Kidney disease Father   . Colon cancer Father 64  . Prostate cancer Father   . Cancer Father   . Kidney disease Sister   . Cancer Brother   .  Breast cancer Maternal Grandmother 103  . Esophageal cancer Neg Hx   . Stomach cancer Neg Hx     Social History   Tobacco Use  . Smoking status: Current Some Day Smoker    Packs/day: 0.25    Years: 30.00    Pack years: 7.50    Types: Cigarettes    Last attempt to quit: 12/28/2016    Years since quitting: 3.4  . Smokeless tobacco: Never Used  . Tobacco comment: Occ. Wants Nicotine Patches   Vaping Use  . Vaping Use: Never used  Substance Use Topics  . Alcohol use: Yes    Alcohol/week: 0.0 standard drinks    Comment: rarely  . Drug use: No    Types: Marijuana    Comment: former    Home Medications Prior to Admission medications   Medication Sig Start Date End Date Taking? Authorizing Provider  acetaminophen (TYLENOL) 325 MG tablet Take 2 tablets (650 mg total) by mouth every 6 (six) hours as needed for moderate pain. 03/20/20 03/20/21  Maudie Mercury, MD  albuterol (PROVENTIL) (2.5 MG/3ML) 0.083% nebulizer solution Take 3 mLs (2.5 mg total) by nebulization every 6 (six) hours as needed for wheezing or shortness of breath. 05/22/20   Shylynn Bruning, DO  albuterol (VENTOLIN HFA) 108 (90 Base) MCG/ACT inhaler Inhale 2 puffs into the lungs every 4 (four) hours as needed for wheezing or shortness of breath. 05/08/20   Aldine Contes, MD  ASPIRIN ADULT LOW STRENGTH 81 MG EC tablet TAKE 1 Tablet BY MOUTH ONCE DAILY 12/03/19   [name], MD  diclofenac Sodium (VOLTAREN) 1 % GEL Apply 2 g topically 4 (four) times daily. 03/21/20   Maudie Mercury, MD  EPINEPHrine 0.3 mg/0.3 mL IJ SOAJ injection Inject 0.3 mLs (0.3 mg total) into the muscle as needed for anaphylaxis. 03/20/20   Maudie Mercury, MD  HYDROcodone-acetaminophen (NORCO/VICODIN) 5-325 MG tablet Take 1 tablet by mouth every 6 (six) hours as needed. 03/23/20   Petrucelli, Samantha R, PA-C  lidocaine (LIDODERM) 5 % Place 1 patch onto the skin daily. Remove & Discard patch within 12 hours or as directed by MD 03/20/20   Maudie Mercury,  MD  loratadine (CLARITIN) 10 MG tablet Take 1 tablet (10 mg total) by mouth daily. 04/26/19   Forde Dandy, PharmD  mometasone-formoterol (DULERA) 200-5 MCG/ACT AERO Inhale 2 puffs into  the lungs 2 (two) times daily. IM Program 03/20/20   Maudie Mercury, MD  montelukast (SINGULAIR) 10 MG tablet Take 1 tablet (10 mg total) by mouth at bedtime. 03/20/20   Maudie Mercury, MD  omeprazole (PRILOSEC) 20 MG capsule Take by mouth daily. 08/24/19   [provider]  oxyCODONE-acetaminophen (PERCOCET/ROXICET) 5-325 MG tablet Take 1 tablet by mouth every 6 (six) hours as needed for severe pain.    [provider]  pantoprazole (PROTONIX) 40 MG tablet Take 1 tablet (40 mg total) by mouth 2 (two) times daily for 5 days. 07/15/19 07/20/19  [name], MD  predniSONE (DELTASONE) 20 MG tablet Take 2 tablets (40 mg total) by mouth daily. 03/19/20   Blanchie Dessert, MD  predniSONE (DELTASONE) 20 MG tablet Take 3 tablets (60 mg total) by mouth daily for 5 days. 05/22/20 05/27/20  [place] Doig, DO  sertraline (ZOLOFT) 100 MG tablet Take 1 tablet (100 mg total) by mouth 2 (two) times daily. 07/24/19 08/23/19  [place], Carley D, DO  tiotropium (SPIRIVA HANDIHALER) 18 MCG inhalation capsule Place 1 capsule (18 mcg total) into inhaler and inhale daily. 03/20/20   Maudie Mercury, MD  divalproex (DEPAKOTE ER) 500 MG 24 hr tablet Take 1 tablet (500 mg total) by mouth 2 (two) times daily. Patient not taking: Reported on 10/08/2019 08/09/19 03/23/20  [name], MD  fluticasone [facility]) 50 MCG/ACT nasal spray Place 1 spray into both nostrils daily. Patient not taking: Reported on 07/11/2019 04/26/19 03/23/20  Forde Dandy, PharmD  ipratropium-albuterol (DUONEB) 0.5-2.5 (3) MG/3ML SOLN Take 3 mLs by nebulization every 6 (six) hours as needed (wheezing or shortness of breath). Patient not taking: Reported on 03/19/2020 06/09/19 03/23/20  [name], MD  metoCLOPramide (REGLAN) 5 MG tablet Take 1  tablet (5 mg total) by mouth 3 (three) times daily before meals for 5 days. Patient not taking: Reported on 03/19/2020 07/17/19 03/23/20  [name], MD  SUMAtriptan (IMITREX) 25 MG tablet Take 1 tablet (25 mg total) by mouth every 2 (two) hours as needed for migraine or headache. Patient not taking: Reported on 10/08/2019 07/24/19 03/23/20  [name], DO    Allergies    Topiramate and Tramadol  Review of Systems   Review of Systems  Constitutional: Negative for chills, fatigue and fever.  HENT: Negative for ear pain and sore throat.   Eyes: Negative for pain and visual disturbance.  Respiratory: Positive for cough and wheezing. Negative for sputum production, chest tightness, shortness of breath and stridor.   Cardiovascular: Negative for chest pain, palpitations, orthopnea and PND.  Gastrointestinal: Negative for abdominal pain and vomiting.  Genitourinary: Negative for dysuria and hematuria.  Musculoskeletal: Negative for arthralgias and back pain.  Skin: Negative for color change and rash.  Neurological: Negative for seizures, syncope and headaches.  All other systems reviewed and are negative.   Physical Exam Updated Vital Signs  ED Triage Vitals  Enc Vitals Group     BP 05/22/20 1202 (!) 128/95     Pulse Rate 05/22/20 1202 86     Resp 05/22/20 1202 (!) 24     Temp 05/22/20 1202 98.2 F (36.8 C)     Temp Source 05/22/20 1202 Oral     SpO2 05/22/20 1202 96 %     Weight --      Height --      Head Circumference --      Peak Flow --      Pain Score 05/22/20  1203 0     Pain Loc --      Pain Edu? --      Excl. in [place]? --     Physical Exam Vitals and nursing note reviewed.  Constitutional:      General: She is not in acute distress.    Appearance: She is well-developed. She is not ill-appearing.  HENT:     Head: Normocephalic and atraumatic.     Nose: Nose normal.     Mouth/Throat:     Mouth: Mucous membranes are moist.  Eyes:     Extraocular Movements:  Extraocular movements intact.     Conjunctiva/sclera: Conjunctivae normal.     Pupils: Pupils are equal, round, and reactive to light.  Cardiovascular:     Rate and Rhythm: Normal rate and regular rhythm.     Pulses: Normal pulses.     Heart sounds: No murmur heard.   Pulmonary:     Effort: Pulmonary effort is normal. No respiratory distress.     Breath sounds: Wheezing present.  Abdominal:     Palpations: Abdomen is soft.     Tenderness: There is no abdominal tenderness.  Musculoskeletal:     Cervical back: Normal range of motion and neck supple.  Skin:    General: Skin is warm and dry.     Capillary Refill: Capillary refill takes less than 2 seconds.  Neurological:     General: No focal deficit present.     Mental Status: She is alert.  Psychiatric:        Mood and Affect: Mood normal.     ED Results / Procedures / Treatments   Labs (all labs ordered are listed, but only abnormal results are displayed) Labs Reviewed - No data to display  EKG None  Radiology No results found.  Procedures Procedures (including critical care time)  Medications Ordered in ED Medications  albuterol (VENTOLIN HFA) 108 (90 Base) MCG/ACT inhaler 2 puff (2 puffs Inhalation Given 05/22/20 1203)  albuterol (VENTOLIN HFA) 108 (90 Base) MCG/ACT inhaler 8 puff (8 puffs Inhalation Given 05/22/20 1219)  predniSONE (DELTASONE) tablet 60 mg (60 mg Oral Given 05/22/20 1220)  ipratropium (ATROVENT HFA) inhaler 2 puff (2 puffs Inhalation Given 05/22/20 1219)    ED Course  I have reviewed the triage vital signs and the nursing notes.  Pertinent labs & imaging results that were available during my care of the patient were reviewed by me and considered in my medical decision making (see chart for details).    MDM Rules/Calculators/A&P                          EUFELIA VENO is a 59 year old female history of asthma who presents to the ED with wheezing.  Patient with normal vitals.  No fever.  No  hypoxia.  Overall normal work of breathing.  Wheezing on exam but good air movement.  Has been using nebulizer with some improvement.  Thinks that she needs steroids.  I agree.  No sputum production.  No fever.  Doubt infectious process.  Has been vaccinated against coronavirus.  Will give albuterol, Atrovent, prednisone and reevaluate.  Anticipate discharge to home with steroids.  Patient with great improvement after breathing treatment. Feels comfortable going home. Will use Atrovent and albuterol and steroids. Given refill for home nebulizer. She understands return precautions. Discharged in good condition.  This chart was dictated using voice recognition software.  Despite best efforts to proofread,  errors  can occur which can change the documentation meaning.    Final Clinical Impression(s) / ED Diagnoses Final diagnoses:  Wheezing  Mild intermittent asthma, unspecified whether complicated    Rx / DC Orders ED Discharge Orders         Ordered    albuterol (PROVENTIL) (2.5 MG/3ML) 0.083% nebulizer solution  Every 6 hours PRN     Discontinue  Reprint     05/22/20 1304    predniSONE (DELTASONE) 20 MG tablet  Daily     Discontinue  Reprint     05/22/20 [place], [place], DO 05/22/20 1306

## 2020-05-22 NOTE — ED Triage Notes (Signed)
Patient c/o wheezing since last night. Hx asthma. States out of inhaler at home. Denies chest pain.

## 2020-06-05 ENCOUNTER — Other Ambulatory Visit: Payer: Self-pay

## 2020-06-05 ENCOUNTER — Ambulatory Visit: Payer: Medicaid Other | Admitting: Internal Medicine

## 2020-06-05 ENCOUNTER — Encounter: Payer: Self-pay | Admitting: Internal Medicine

## 2020-06-05 NOTE — Progress Notes (Signed)
Patient left prior to being seen

## 2020-06-12 ENCOUNTER — Other Ambulatory Visit: Payer: Self-pay

## 2020-06-12 ENCOUNTER — Encounter: Payer: Self-pay | Admitting: Internal Medicine

## 2020-06-12 ENCOUNTER — Ambulatory Visit: Payer: Medicaid Other | Admitting: Internal Medicine

## 2020-06-12 DIAGNOSIS — F329 Major depressive disorder, single episode, unspecified: Secondary | ICD-10-CM

## 2020-06-12 DIAGNOSIS — K219 Gastro-esophageal reflux disease without esophagitis: Secondary | ICD-10-CM | POA: Diagnosis not present

## 2020-06-12 DIAGNOSIS — M533 Sacrococcygeal disorders, not elsewhere classified: Secondary | ICD-10-CM

## 2020-06-12 DIAGNOSIS — J449 Chronic obstructive pulmonary disease, unspecified: Secondary | ICD-10-CM

## 2020-06-12 DIAGNOSIS — J45998 Other asthma: Secondary | ICD-10-CM | POA: Diagnosis not present

## 2020-06-12 DIAGNOSIS — J45901 Unspecified asthma with (acute) exacerbation: Secondary | ICD-10-CM

## 2020-06-12 DIAGNOSIS — J4541 Moderate persistent asthma with (acute) exacerbation: Secondary | ICD-10-CM

## 2020-06-12 DIAGNOSIS — F32A Depression, unspecified: Secondary | ICD-10-CM

## 2020-06-12 DIAGNOSIS — Z91030 Bee allergy status: Secondary | ICD-10-CM

## 2020-06-12 DIAGNOSIS — J4489 Other specified chronic obstructive pulmonary disease: Secondary | ICD-10-CM

## 2020-06-12 MED ORDER — SPIRIVA HANDIHALER 18 MCG IN CAPS: 18.0000 ug | ORAL_CAPSULE | Freq: Every day | RESPIRATORY_TRACT | 2 refills | Status: DC

## 2020-06-12 MED ORDER — PANTOPRAZOLE SODIUM 40 MG PO TBEC: 40.0000 mg | DELAYED_RELEASE_TABLET | Freq: Every day | ORAL | 0 refills | Status: DC

## 2020-06-12 MED ORDER — ALBUTEROL SULFATE HFA 108 (90 BASE) MCG/ACT IN AERS: 2.0000 | INHALATION_SPRAY | RESPIRATORY_TRACT | 1 refills | Status: DC | PRN

## 2020-06-12 MED ORDER — LIDOCAINE 5 % EX PTCH: 1.0000 | MEDICATED_PATCH | CUTANEOUS | 1 refills | Status: DC

## 2020-06-12 MED ORDER — EPINEPHRINE 0.3 MG/0.3ML IJ SOAJ: 0.3000 mg | INTRAMUSCULAR | 1 refills | Status: DC | PRN

## 2020-06-12 MED ORDER — DULERA 200-5 MCG/ACT IN AERO: 2.0000 | INHALATION_SPRAY | Freq: Two times a day (BID) | RESPIRATORY_TRACT | 2 refills | Status: DC

## 2020-06-12 NOTE — Progress Notes (Signed)
CC: COPD  HPI: Kathleen [name] is a 59 y.o. with PMH listed below presenting with complaint of copd. Please see problem based assessment and plan for further details.  Past Medical History:  Diagnosis Date  . Anxiety   . Arthritis    hands  . Asthma    2 sets of PFT's in 04/09 without sign variability. Last set with significant decrease in FEV1 with saline alone, suggesting Asthma but  recommended clinical corelation   . Asthma   . Bipolar 1 disorder [facility])    therapist is Caryl Asp and is followed by [employer]  . Blackout    negative work up including ESR, ANA, opthalmology referral, carotid dopplers, 2D echo, MRI and EEG.  . BREAST LUMP 03/25/2008   Annotation: bilaterally Qualifier: Diagnosis of  By: Tomasa Hosteller MD, Edmon Crape.   . 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 Bronchitis   . Chronic headache   . Chronic low back pain 08/08/2008   Qualifier: Diagnosis of  By: Redmond Pulling  MD, Mateo Flow    . Chronic pain    normal work up including TSH, RPR, B12, HIV, plain films, 2 ESR's, ANA, CK, RF along with routine CBC, CMET and UA. Further work up includes CRP, ESR, SPEP/UPEP, hepatitis erology, A1C , repeat ANA  . COPD (chronic obstructive pulmonary disease) ([place])   . COPD exacerbation ([place]) 03/31/2019  . Depression   . DUB (dysfunctional uterine bleeding)    and pelvic pain, with negative endometrial bx in 07/09 and transvaginal U/S significant for mild fibroids in 0/09.  . Emphysema of lung ([place])   . GERD (gastroesophageal reflux disease)   . Hallucinations 03/18/2008   Qualifier: Diagnosis of  By: Redmond Pulling  MD, Mateo Flow    . Hyperlipidemia   . Lower extremity edema    Neg ABI's, normal 2D echo, normal albumin  . Menorrhagia   . Ovarian cyst   . Polysubstance abuse ([place])    none since March 17,2009.  Marland Kitchen Sleep apnea    NO CPAP  . 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 Tubular adenoma of  colon    Review of Systems: Review of Systems  Constitutional: Negative for chills, fever and malaise/fatigue.  Eyes: Negative for blurred vision.  Respiratory: Positive for shortness of breath and wheezing. Negative for cough.   Cardiovascular: Negative for chest pain, palpitations and leg swelling.  Gastrointestinal: Negative for constipation, diarrhea, nausea and vomiting.  All other systems reviewed and are negative.    Physical Exam: Vitals:   06/12/20 1535  BP: 108/76  Pulse: 80  Temp: 98.5 F (36.9 C)  TempSrc: Oral  SpO2: 98%  Weight: 162 lb 9.6 oz (73.8 kg)  Height: _0  (1.6 m)    Physical Exam Constitutional:      General: She is not in acute distress.    Appearance: Normal appearance.  HENT:     Head: Normocephalic and atraumatic.     Mouth/Throat:     Mouth: Mucous membranes are moist.     Pharynx: Oropharynx is clear.  Eyes:     Conjunctiva/sclera: Conjunctivae normal.  Cardiovascular:     Rate and Rhythm: Normal rate and regular rhythm.     Pulses: Normal pulses.     Heart sounds: Normal heart sounds. No murmur heard.   Pulmonary:     Effort: Pulmonary effort is normal.  Breath sounds: Normal breath sounds. No wheezing or rales.     Comments: Transmitted upper-airway sounds Abdominal:     General: Abdomen is flat. Bowel sounds are normal. There is no distension.     Tenderness: There is no abdominal tenderness.  Musculoskeletal:        General: No swelling. Normal range of motion.  Skin:    General: Skin is warm and dry.  Neurological:     Mental Status: She is alert and oriented to person, place, and time.      Assessment & Plan:   Asthma-COPD overlap syndrome ([place]) Kathleen [name] is a 59 yo F w/ PMH of GERD, Asthma/COPD overlap, Generalized anxiety disorder, prior crack/cocaine use, GERD and chronic low back pain presenting to [facility] for management of her COPD. She mentions that she ran out of her maintenance inhaler yesterday and she needs  refills. She mentions continuing to endorse chronic shortness of breath with significant shortness of breath with <1 block ambulation. She denies any cough, productive sputum, fevers, or chills. She states her breathing appears better than usual but was worried due to running out of her inhalers and nebulizers. She also requests samples from clinic as she has financial and transportation difficulties in getting her inhalers refilled. She also requests handicap -parking application filled for her dyspnea  A/P Present for continued management of her COPD. Unable to find PFTs. Recently ordered in 2020 but never performed. Per degree of symptoms, consistent w/ GOLD D COPD criteria. Currently on triple therapy. Not in exacerbation at this visit. Mentions interest in pulm rehab  - Discussed samples are reserved for uninsured patients only - Sent refills for maintenance and rescue inhalers to pharmacy - PFTs re-ordered - Referral to pulm rehab  Allergy to bee sting Pt requires refills on medications with associated diagnosis above.  Reviewed disease process and find this medication to be necessary, will not change dose or alter current therapy.  Sacral back pain Pt requires refills on medications with associated diagnosis above.  Reviewed disease process and find this medication to be necessary, will not change dose or alter current therapy.  GERD Pt requires refills on medications with associated diagnosis above.  Reviewed disease process and find this medication to be necessary, will not change dose or alter current therapy.  Depression Previously treated w/ sertraline. She mentions that she has no depressive symptoms this visit. PHQ-2 negative. Mentions that she has been 'weaning off' any depression meds.   - Updated med list    Patient discussed with Dr. Dareen Piano  -Gilberto Better, [facility] Pager: [phone]

## 2020-06-12 NOTE — Assessment & Plan Note (Signed)
Pt requires refills on medications with associated diagnosis above.  Reviewed disease process and find this medication to be necessary, will not change dose or alter current therapy. 

## 2020-06-12 NOTE — Assessment & Plan Note (Signed)
Previously treated w/ sertraline. She mentions that she has no depressive symptoms this visit. PHQ-2 negative. Mentions that she has been 'weaning off' any depression meds.   - Updated med list

## 2020-06-12 NOTE — Patient Instructions (Signed)
Thank you for allowing [place] to provide your care today. Today we discussed your COPD    I have ordered no labs for you. I will call if any are abnormal.    Today we made no changes to your medications.    Please follow-up in 3 months.    Should you have any questions or concerns please call the internal medicine clinic at [phone].     Chronic Obstructive Pulmonary Disease Chronic obstructive pulmonary disease (COPD) is a long-term (chronic) lung problem. When you have COPD, it is hard for air to get in and out of your lungs. Usually the condition gets worse over time, and your lungs will never return to normal. There are things you can do to keep yourself as healthy as possible.  Your doctor may treat your condition with: ? Medicines. ? Oxygen. ? Lung surgery.  Your doctor may also recommend: ? Rehabilitation. This includes steps to make your body work better. It may involve a team of specialists. ? Quitting smoking, if you smoke. ? Exercise and changes to your diet. ? Comfort measures (palliative care). Follow these instructions at home: Medicines  Take over-the-counter and prescription medicines only as told by your doctor.  Talk to your doctor before taking any cough or allergy medicines. You may need to avoid medicines that cause your lungs to be dry. Lifestyle  If you smoke, stop. Smoking makes the problem worse. If you need help quitting, ask your doctor.  Avoid being around things that make your breathing worse. This may include smoke, chemicals, and fumes.  Stay active, but remember to rest as well.  Learn and use tips on how to relax.  Make sure you get enough sleep. Most adults need at least 7 hours of sleep every night.  Eat healthy foods. Eat smaller meals more often. Rest before meals. Controlled breathing Learn and use tips on how to control your breathing as told by your doctor. Try:  Breathing in (inhaling) through your nose for 1 second. Then, pucker  your lips and breath out (exhale) through your lips for 2 seconds.  Putting one hand on your belly (abdomen). Breathe in slowly through your nose for 1 second. Your hand on your belly should move out. Pucker your lips and breathe out slowly through your lips. Your hand on your belly should move in as you breathe out.  Controlled coughing Learn and use controlled coughing to clear mucus from your lungs. Follow these steps: 1. Lean your head a little forward. 2. Breathe in deeply. 3. Try to hold your breath for 3 seconds. 4. Keep your mouth slightly open while coughing 2 times. 5. Spit any mucus out into a tissue. 6. Rest and do the steps again 1 or 2 times as needed. General instructions  Make sure you get all the shots (vaccines) that your doctor recommends. Ask your doctor about a flu shot and a pneumonia shot.  Use oxygen therapy and pulmonary rehabilitation if told by your doctor. If you need home oxygen therapy, ask your doctor if you should buy a tool to measure your oxygen level (oximeter).  Make a COPD action plan with your doctor. This helps you to know what to do if you feel worse than usual.  Manage any other conditions you have as told by your doctor.  Avoid going outside when it is very hot, cold, or humid.  Avoid people who have a sickness you can catch (contagious).  Keep all follow-up visits as told by  your doctor. This is important. Contact a doctor if:  You cough up more mucus than usual.  There is a change in the color or thickness of the mucus.  It is harder to breathe than usual.  Your breathing is faster than usual.  You have trouble sleeping.  You need to use your medicines more often than usual.  You have trouble doing your normal activities such as getting dressed or walking around the house. Get help right away if:  You have shortness of breath while resting.  You have shortness of breath that stops you from: ? Being able to talk. ? Doing  normal activities.  Your chest hurts for longer than 5 minutes.  Your skin color is more blue than usual.  Your pulse oximeter shows that you have low oxygen for longer than 5 minutes.  You have a fever.  You feel too tired to breathe normally. Summary  Chronic obstructive pulmonary disease (COPD) is a long-term lung problem.  The way your lungs work will never return to normal. Usually the condition gets worse over time. There are things you can do to keep yourself as healthy as possible.  Take over-the-counter and prescription medicines only as told by your doctor.  If you smoke, stop. Smoking makes the problem worse. This information is not intended to replace advice given to you by your health care provider. Make sure you discuss any questions you have with your health care provider. Document Revised: 11/11/2017 Document Reviewed: 01/03/2017 Elsevier Patient Education  2020 [employer].

## 2020-06-12 NOTE — Assessment & Plan Note (Addendum)
Kathleen [name] is a 59 yo F w/ PMH of GERD, Asthma/COPD overlap, Generalized anxiety disorder, prior crack/cocaine use, GERD and chronic low back pain presenting to [facility] for management of her COPD. She mentions that she ran out of her maintenance inhaler yesterday and she needs refills. She mentions continuing to endorse chronic shortness of breath with significant shortness of breath with <1 block ambulation. She denies any cough, productive sputum, fevers, or chills. She states her breathing appears better than usual but was worried due to running out of her inhalers and nebulizers. She also requests samples from clinic as she has financial and transportation difficulties in getting her inhalers refilled. She also requests handicap -parking application filled for her dyspnea  A/P Present for continued management of her COPD. Unable to find PFTs. Recently ordered in 2020 but never performed. Per degree of symptoms, consistent w/ GOLD D COPD criteria. Currently on triple therapy. Not in exacerbation at this visit. Mentions interest in pulm rehab  - Discussed samples are reserved for uninsured patients only - Sent refills for maintenance and rescue inhalers to pharmacy - PFTs re-ordered - Referral to pulm rehab

## 2020-06-13 NOTE — Progress Notes (Signed)
Internal Medicine Clinic Attending ° °Case discussed with Dr. Lee at the time of the visit.  We reviewed the resident’s history and exam and pertinent patient test results.  I agree with the assessment, diagnosis, and plan of care documented in the resident’s note.  °

## 2020-06-18 NOTE — Addendum Note (Signed)
Addended by: Mosetta Anis on: 06/18/2020 04:56 PM   Modules accepted: Orders

## 2020-06-19 ENCOUNTER — Telehealth (HOSPITAL_COMMUNITY): Payer: Self-pay

## 2020-06-19 NOTE — Telephone Encounter (Signed)
Called pt insurance to see if her new [employer] insurance covered pulmonary rehab. I was then told that the birthday we have in our system is different than what they had. Danielle the customer representative stated that she will place a ticket and call back to see if it can be fixed. Called pt and left a message to call back so I can advise her that her insurance has the incorrect birthday for her. I pulled up pt insurance on [employer] and they have the birthday that her insurance had, called patient insurance back received the insurance benefits for pulmonary rehab and they do cover the program.

## 2020-06-22 ENCOUNTER — Emergency Department (HOSPITAL_COMMUNITY): Payer: Medicaid Other

## 2020-06-22 ENCOUNTER — Other Ambulatory Visit: Payer: Self-pay

## 2020-06-22 ENCOUNTER — Encounter (HOSPITAL_COMMUNITY): Payer: Self-pay

## 2020-06-22 ENCOUNTER — Emergency Department (HOSPITAL_COMMUNITY)
Admission: EM | Admit: 2020-06-22 | Discharge: 2020-06-22 | Disposition: A | Discharge status: Home / Self Care | Payer: Medicaid Other | Attending: Emergency Medicine | Admitting: Emergency Medicine

## 2020-06-22 DIAGNOSIS — Z7982 Long term (current) use of aspirin: Secondary | ICD-10-CM | POA: Insufficient documentation

## 2020-06-22 DIAGNOSIS — J441 Chronic obstructive pulmonary disease with (acute) exacerbation: Secondary | ICD-10-CM

## 2020-06-22 DIAGNOSIS — Z8673 Personal history of transient ischemic attack (TIA), and cerebral infarction without residual deficits: Secondary | ICD-10-CM | POA: Diagnosis not present

## 2020-06-22 DIAGNOSIS — R0602 Shortness of breath: Secondary | ICD-10-CM | POA: Diagnosis present

## 2020-06-22 DIAGNOSIS — Z79899 Other long term (current) drug therapy: Secondary | ICD-10-CM | POA: Diagnosis not present

## 2020-06-22 DIAGNOSIS — Z87891 Personal history of nicotine dependence: Secondary | ICD-10-CM | POA: Diagnosis not present

## 2020-06-22 DIAGNOSIS — F1210 Cannabis abuse, uncomplicated: Secondary | ICD-10-CM | POA: Insufficient documentation

## 2020-06-22 DIAGNOSIS — Z20822 Contact with and (suspected) exposure to covid-19: Secondary | ICD-10-CM | POA: Diagnosis not present

## 2020-06-22 LAB — CBC WITH DIFFERENTIAL/PLATELET
Abs Immature Granulocytes: 0.02 10*3/uL (ref 0.00–0.07)
Basophils Absolute: 0.1 10*3/uL (ref 0.0–0.1)
Basophils Relative: 1 %
Eosinophils Absolute: 0.4 10*3/uL (ref 0.0–0.5)
Eosinophils Relative: 6 %
HCT: 44.2 % (ref 36.0–46.0)
Hemoglobin: 14.2 g/dL (ref 12.0–15.0)
Immature Granulocytes: 0 %
Lymphocytes Relative: 32 %
Lymphs Abs: 2.2 10*3/uL (ref 0.7–4.0)
MCH: 28.1 pg (ref 26.0–34.0)
MCHC: 32.1 g/dL (ref 30.0–36.0)
MCV: 87.4 fL (ref 80.0–100.0)
Monocytes Absolute: 0.6 10*3/uL (ref 0.1–1.0)
Monocytes Relative: 9 %
Neutro Abs: 3.5 10*3/uL (ref 1.7–7.7)
Neutrophils Relative %: 52 %
Platelets: 343 10*3/uL (ref 150–400)
RBC: 5.06 MIL/uL (ref 3.87–5.11)
RDW: 13.3 % (ref 11.5–15.5)
WBC: 6.8 10*3/uL (ref 4.0–10.5)
nRBC: 0 % (ref 0.0–0.2)

## 2020-06-22 LAB — TROPONIN I (HIGH SENSITIVITY)
Troponin I (High Sensitivity): 2 ng/L (ref <18)
Troponin I (High Sensitivity): <2 ng/L (ref <18)

## 2020-06-22 LAB — BASIC METABOLIC PANEL
Anion gap: 10 (ref 5–15)
BUN: 14 mg/dL (ref 6–20)
CO2: 24 mmol/L (ref 22–32)
Calcium: 8.9 mg/dL (ref 8.9–10.3)
Chloride: 106 mmol/L (ref 98–111)
Creatinine, Ser: 1.18 mg/dL — ABNORMAL HIGH (ref 0.44–1.00)
GFR calc Af Amer: 58 mL/min — ABNORMAL LOW (ref >60)
GFR calc non Af Amer: 50 mL/min — ABNORMAL LOW (ref >60)
Glucose, Bld: 91 mg/dL (ref 70–99)
Potassium: 3.7 mmol/L (ref 3.5–5.1)
Sodium: 140 mmol/L (ref 135–145)

## 2020-06-22 LAB — POC SARS CORONAVIRUS 2 AG -  ED: SARS Coronavirus 2 Ag: NEGATIVE

## 2020-06-22 LAB — SARS CORONAVIRUS 2 BY RT PCR (HOSPITAL ORDER, PERFORMED IN ~~LOC~~ HOSPITAL LAB): SARS Coronavirus 2: NEGATIVE

## 2020-06-22 MED ORDER — ALBUTEROL SULFATE HFA 108 (90 BASE) MCG/ACT IN AERS
8.0000 | INHALATION_SPRAY | Freq: Once | RESPIRATORY_TRACT | Status: AC
  Administered 2020-06-22: 8 via RESPIRATORY_TRACT
  Filled 2020-06-22: qty 6.7

## 2020-06-22 MED ORDER — MORPHINE SULFATE (PF) 4 MG/ML IV SOLN
4.0000 mg | Freq: Once | INTRAVENOUS | Status: AC
  Administered 2020-06-22: 4 mg via INTRAVENOUS
  Filled 2020-06-22: qty 1

## 2020-06-22 MED ORDER — METHYLPREDNISOLONE SODIUM SUCC 125 MG IJ SOLR
80.0000 mg | Freq: Once | INTRAMUSCULAR | Status: AC
  Administered 2020-06-22: 80 mg via INTRAVENOUS
  Filled 2020-06-22: qty 2

## 2020-06-22 MED ORDER — BENZONATATE 100 MG PO CAPS
100.0000 mg | ORAL_CAPSULE | Freq: Three times a day (TID) | ORAL | 0 refills | Status: DC
Start: 2020-06-22 — End: 2021-03-19

## 2020-06-22 MED ORDER — ALBUTEROL (5 MG/ML) CONTINUOUS INHALATION SOLN
15.0000 mg/h | INHALATION_SOLUTION | RESPIRATORY_TRACT | Status: DC
  Administered 2020-06-22: 15 mg/h via RESPIRATORY_TRACT
  Filled 2020-06-22: qty 20

## 2020-06-22 MED ORDER — PREDNISONE 20 MG PO TABS
40.0000 mg | ORAL_TABLET | Freq: Every day | ORAL | 0 refills | Status: DC
Start: 2020-06-22 — End: 2020-06-23

## 2020-06-22 MED ORDER — IPRATROPIUM BROMIDE HFA 17 MCG/ACT IN AERS
4.0000 | INHALATION_SPRAY | Freq: Once | RESPIRATORY_TRACT | Status: AC
  Administered 2020-06-22: 4 via RESPIRATORY_TRACT

## 2020-06-22 MED ORDER — IPRATROPIUM BROMIDE 0.02 % IN SOLN
1.0000 mg | Freq: Once | RESPIRATORY_TRACT | Status: AC
  Administered 2020-06-22: 1 mg via RESPIRATORY_TRACT
  Filled 2020-06-22: qty 5

## 2020-06-22 NOTE — ED Notes (Signed)
Respiratory called per Dr. Wilson Singer.

## 2020-06-22 NOTE — ED Triage Notes (Signed)
Pt c/o SOB since last night. Pt c/o bilateral arm pain. Pt hx COPD, asthma, bronchitis. Pt 86% RA, 100% [place] 2L currently.

## 2020-06-22 NOTE — Progress Notes (Signed)
Pts continuous neb on hold pending short covid test. Inhalers have been given by the RN. Pt is on 2L breathing at 22 rr. Will start neb asap

## 2020-06-22 NOTE — ED Notes (Signed)
An After Visit Summary was printed and given to the patient. Discharge instructions given and no further questions at this time.  

## 2020-06-23 ENCOUNTER — Telehealth: Payer: Self-pay

## 2020-06-23 ENCOUNTER — Ambulatory Visit: Payer: Medicaid Other | Admitting: Internal Medicine

## 2020-06-23 ENCOUNTER — Encounter: Payer: Self-pay | Admitting: Internal Medicine

## 2020-06-23 VITALS — BP 121/81 | HR 103 | Temp 98.3°F | Ht 63.0 in | Wt 161.4 lb

## 2020-06-23 DIAGNOSIS — K219 Gastro-esophageal reflux disease without esophagitis: Secondary | ICD-10-CM

## 2020-06-23 DIAGNOSIS — J441 Chronic obstructive pulmonary disease with (acute) exacerbation: Secondary | ICD-10-CM

## 2020-06-23 DIAGNOSIS — J45901 Unspecified asthma with (acute) exacerbation: Secondary | ICD-10-CM | POA: Diagnosis not present

## 2020-06-23 DIAGNOSIS — J449 Chronic obstructive pulmonary disease, unspecified: Secondary | ICD-10-CM

## 2020-06-23 DIAGNOSIS — J4489 Other specified chronic obstructive pulmonary disease: Secondary | ICD-10-CM

## 2020-06-23 DIAGNOSIS — F32A Depression, unspecified: Secondary | ICD-10-CM

## 2020-06-23 DIAGNOSIS — F329 Major depressive disorder, single episode, unspecified: Secondary | ICD-10-CM

## 2020-06-23 MED ORDER — ALBUTEROL SULFATE (2.5 MG/3ML) 0.083% IN NEBU
2.5000 mg | INHALATION_SOLUTION | Freq: Once | RESPIRATORY_TRACT | Status: AC
  Administered 2020-06-23: 2.5 mg via RESPIRATORY_TRACT

## 2020-06-23 MED ORDER — METHYLPREDNISOLONE SODIUM SUCC 40 MG IJ SOLR: 40.0000 mg | Freq: Once | INTRAMUSCULAR | Status: DC

## 2020-06-23 MED ORDER — IPRATROPIUM-ALBUTEROL 0.5-2.5 (3) MG/3ML IN SOLN: 3.0000 mL | Freq: Once | RESPIRATORY_TRACT | Status: DC

## 2020-06-23 MED ORDER — ONDANSETRON HCL 4 MG PO TABS: 4.0000 mg | ORAL_TABLET | Freq: Every day | ORAL | 0 refills | Status: DC | PRN

## 2020-06-23 MED ORDER — PANTOPRAZOLE SODIUM 40 MG PO TBEC: 40.0000 mg | DELAYED_RELEASE_TABLET | Freq: Every day | ORAL | 0 refills | Status: DC

## 2020-06-23 MED ORDER — MONTELUKAST SODIUM 10 MG PO TABS: 10.0000 mg | ORAL_TABLET | Freq: Every day | ORAL | 3 refills | Status: DC

## 2020-06-23 MED ORDER — METHYLPREDNISOLONE SODIUM SUCC 125 MG IJ SOLR
40.0000 mg | Freq: Once | INTRAMUSCULAR | Status: AC
  Administered 2020-06-23: 40 mg via INTRAMUSCULAR

## 2020-06-23 MED ORDER — SERTRALINE HCL 100 MG PO TABS: 100.0000 mg | ORAL_TABLET | Freq: Every day | ORAL | 3 refills | Status: DC

## 2020-06-23 MED ORDER — PREDNISONE 20 MG PO TABS: 40.0000 mg | ORAL_TABLET | Freq: Every day | ORAL | 0 refills | Status: DC

## 2020-06-23 NOTE — Assessment & Plan Note (Signed)
Pt requires refills on medications with associated diagnosis above.  Reviewed disease process and find this medication to be necessary, will not change dose or alter current therapy. 

## 2020-06-23 NOTE — Telephone Encounter (Signed)
Received TC from patient, states she was seen on 06/12/20 and was told she was going to be contacted about getting a new nebulizer machine d/t her current machine not working.  Pt states she has not heard anything and needs a new machine.   Pt states she was seen in ER yesterday for COPD exacerbation and was told to f/u in clinic.  Pt states she is still coughing and wheezing some, no distress noted from this nurse.  Pt is requesting a clinic appt today.  Appt made for today, [place] team/Dr. Truman Hayward at 1415 per front desk. Will also forward to Lauren and Mamie to f/u on nebulizer machine. Thank you, SChaplin, RN,BSN

## 2020-06-23 NOTE — Assessment & Plan Note (Signed)
Kathleen [name] is a 59 yo F w/ PMH of GERD, COPD gold D w/ Asthma overlap, GAD, GERD and chronic low back pain presenting to [facility] for COPD. She mentions that she was in her usual state of health until her nebulizer 'blew up' when it made a loud noise and it stopped working. She states she has been using it for 15 years and she had required it on almost daily occurrence due to severity of her symptoms. Shortly after, she developed worsening cough and wheezing and went to ED for evaluation. She received nebulizer treatment and IV steroids. She was discharged and was advised to pick up her meds but mentions that pharmacy did not have the prednisone order. She has been continuing to endorse significant wheezing and cough with chest congestion. Denies minimal sputum production.  A/p Present for ED f/u visit for COPD exacerbation. Per chart review, 5 day course of PO prednisone ordered as paper script. Will send prednisone order to her pharmacy. Also noted to have significant wheezing and difficulty getting words out in clinic. Will provide solumedrol and albuterol nebulizer treatment while here. Also will require new nebulizer machine at home to continue management of her COPD exacerbation and prevent re-admission. New order for new nebulizer machine with supplies sent. - New nebulizer machine with supplies - Prednisone 40mg  to complete 5 day course - C/w maintenance inhalers + Singulair - F/u PFT - F/u w/ pulm rehab

## 2020-06-23 NOTE — Patient Instructions (Addendum)
Thank you for allowing [place] to provide your care today. Today we discussed your COPD    I have ordered no labs for you. I will call if any are abnormal.    Today we made the following changes to your medications.    Please start prednisone 40mg  daily Please take zofran as needed for nausea  Please follow-up in as needed.    Should you have any questions or concerns please call the internal medicine clinic at [phone].     Chronic Obstructive Pulmonary Disease Chronic obstructive pulmonary disease (COPD) is a long-term (chronic) lung problem. When you have COPD, it is hard for air to get in and out of your lungs. Usually the condition gets worse over time, and your lungs will never return to normal. There are things you can do to keep yourself as healthy as possible.  Your doctor may treat your condition with: ? Medicines. ? Oxygen. ? Lung surgery.  Your doctor may also recommend: ? Rehabilitation. This includes steps to make your body work better. It may involve a team of specialists. ? Quitting smoking, if you smoke. ? Exercise and changes to your diet. ? Comfort measures (palliative care). Follow these instructions at home: Medicines  Take over-the-counter and prescription medicines only as told by your doctor.  Talk to your doctor before taking any cough or allergy medicines. You may need to avoid medicines that cause your lungs to be dry. Lifestyle  If you smoke, stop. Smoking makes the problem worse. If you need help quitting, ask your doctor.  Avoid being around things that make your breathing worse. This may include smoke, chemicals, and fumes.  Stay active, but remember to rest as well.  Learn and use tips on how to relax.  Make sure you get enough sleep. Most adults need at least 7 hours of sleep every night.  Eat healthy foods. Eat smaller meals more often. Rest before meals. Controlled breathing Learn and use tips on how to control your breathing as told by  your doctor. Try:  Breathing in (inhaling) through your nose for 1 second. Then, pucker your lips and breath out (exhale) through your lips for 2 seconds.  Putting one hand on your belly (abdomen). Breathe in slowly through your nose for 1 second. Your hand on your belly should move out. Pucker your lips and breathe out slowly through your lips. Your hand on your belly should move in as you breathe out.  Controlled coughing Learn and use controlled coughing to clear mucus from your lungs. Follow these steps: 1. Lean your head a little forward. 2. Breathe in deeply. 3. Try to hold your breath for 3 seconds. 4. Keep your mouth slightly open while coughing 2 times. 5. Spit any mucus out into a tissue. 6. Rest and do the steps again 1 or 2 times as needed. General instructions  Make sure you get all the shots (vaccines) that your doctor recommends. Ask your doctor about a flu shot and a pneumonia shot.  Use oxygen therapy and pulmonary rehabilitation if told by your doctor. If you need home oxygen therapy, ask your doctor if you should buy a tool to measure your oxygen level (oximeter).  Make a COPD action plan with your doctor. This helps you to know what to do if you feel worse than usual.  Manage any other conditions you have as told by your doctor.  Avoid going outside when it is very hot, cold, or humid.  Avoid people who have  a sickness you can catch (contagious).  Keep all follow-up visits as told by your doctor. This is important. Contact a doctor if:  You cough up more mucus than usual.  There is a change in the color or thickness of the mucus.  It is harder to breathe than usual.  Your breathing is faster than usual.  You have trouble sleeping.  You need to use your medicines more often than usual.  You have trouble doing your normal activities such as getting dressed or walking around the house. Get help right away if:  You have shortness of breath while  resting.  You have shortness of breath that stops you from: ? Being able to talk. ? Doing normal activities.  Your chest hurts for longer than 5 minutes.  Your skin color is more blue than usual.  Your pulse oximeter shows that you have low oxygen for longer than 5 minutes.  You have a fever.  You feel too tired to breathe normally. Summary  Chronic obstructive pulmonary disease (COPD) is a long-term lung problem.  The way your lungs work will never return to normal. Usually the condition gets worse over time. There are things you can do to keep yourself as healthy as possible.  Take over-the-counter and prescription medicines only as told by your doctor.  If you smoke, stop. Smoking makes the problem worse. This information is not intended to replace advice given to you by your health care provider. Make sure you discuss any questions you have with your health care provider. Document Revised: 11/11/2017 Document Reviewed: 01/03/2017 Elsevier Patient Education  2020 [employer].

## 2020-06-23 NOTE — Progress Notes (Signed)
CC: Shortness of breath  HPI: Kathleen [name] is a 59 y.o. with PMH listed below presenting with complaint of shortness of breath. Please see problem based assessment and plan for further details.  Past Medical History:  Diagnosis Date  . Anxiety   . Arthritis    hands  . Asthma    2 sets of PFT's in 04/09 without sign variability. Last set with significant decrease in FEV1 with saline alone, suggesting Asthma but  recommended clinical corelation   . Asthma   . Bipolar 1 disorder [facility])    therapist is Caryl Asp and is followed by [facility]  . Blackout    negative work up including ESR, ANA, opthalmology referral, carotid dopplers, 2D echo, MRI and EEG.  . BREAST LUMP 03/25/2008   Annotation: bilaterally Qualifier: Diagnosis of  By: Tomasa Hosteller MD, Edmon Crape.   . 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 Bronchitis   . Chronic headache   . Chronic low back pain 08/08/2008   Qualifier: Diagnosis of  By: Redmond Pulling  MD, Mateo Flow    . Chronic pain    normal work up including TSH, RPR, B12, HIV, plain films, 2 ESR's, ANA, CK, RF along with routine CBC, CMET and UA. Further work up includes CRP, ESR, SPEP/UPEP, hepatitis erology, A1C , repeat ANA  . COPD (chronic obstructive pulmonary disease) ([place])   . COPD exacerbation ([place]) 03/31/2019  . Depression   . DUB (dysfunctional uterine bleeding)    and pelvic pain, with negative endometrial bx in 07/09 and transvaginal U/S significant for mild fibroids in 0/09.  . Emphysema of lung ([place])   . GERD (gastroesophageal reflux disease)   . Hallucinations 03/18/2008   Qualifier: Diagnosis of  By: Redmond Pulling  MD, Mateo Flow    . Hyperlipidemia   . Lower extremity edema    Neg ABI's, normal 2D echo, normal albumin  . Menorrhagia   . Ovarian cyst   . Polysubstance abuse ([place])    none since March 17,2009.  Marland Kitchen Sleep apnea    NO CPAP  . 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 Tubular adenoma of colon    Review of Systems: Review of Systems  Constitutional: Positive for malaise/fatigue. Negative for chills and fever.  Respiratory: Positive for cough, shortness of breath and wheezing.   Cardiovascular: Negative for chest pain, palpitations and leg swelling.  Gastrointestinal: Positive for nausea and vomiting. Negative for constipation and diarrhea.  All other systems reviewed and are negative.   Physical Exam: Vitals:   06/23/20 1422  BP: 121/81  Pulse: (!) 103  Temp: 98.3 F (36.8 C)  TempSrc: Oral  SpO2: 100%  Weight: 161 lb 6.4 oz (73.2 kg)  Height: '5\' 3"'  (1.6 m)    Physical Exam Constitutional:      Appearance: She is normal weight. She is ill-appearing.  Cardiovascular:     Rate and Rhythm: Regular rhythm. Tachycardia present.     Heart sounds: No murmur heard.   Pulmonary:     Effort: Respiratory distress (Accessory muscle use with tri-podding) present.     Breath sounds: No stridor. Wheezing (Expiratory wheezes with prolonged exp phase) present. No rales.  Musculoskeletal:        General: No swelling. Normal range of motion.     Cervical back: Normal range of motion.  Skin:    General: Skin is warm  and dry.  Neurological:     Mental Status: She is alert and oriented to person, place, and time.     Assessment & Plan:   GERD Pt requires refills on medications with associated diagnosis above.  Reviewed disease process and find this medication to be necessary, will not change dose or alter current therapy.  Asthma-COPD overlap syndrome ([place]) [name] is a 59 yo F w/ PMH of GERD, COPD gold D w/ Asthma overlap, GAD, GERD and chronic low back pain presenting to [facility] for COPD. She mentions that she was in her usual state of health until her nebulizer 'blew up' when it made a loud noise and it stopped working. She states she has been using it for 15 years and she had required it on almost daily occurrence due to severity of her  symptoms. Shortly after, she developed worsening cough and wheezing and went to ED for evaluation. She received nebulizer treatment and IV steroids. She was discharged and was advised to pick up her meds but mentions that pharmacy did not have the prednisone order. She has been continuing to endorse significant wheezing and cough with chest congestion. Denies minimal sputum production.  A/p Present for ED f/u visit for COPD exacerbation. Per chart review, 5 day course of PO prednisone ordered as paper script. Will send prednisone order to her pharmacy. Also noted to have significant wheezing and difficulty getting words out in clinic. Will provide solumedrol and albuterol nebulizer treatment while here. Also will require new nebulizer machine at home to continue management of her COPD exacerbation and prevent re-admission. New order for new nebulizer machine with supplies sent. - New nebulizer machine with supplies - Prednisone 16m to complete 5 day course - C/w maintenance inhalers + Singulair - F/u PFT - F/u w/ pulm rehab  Depression Pt requires refills on medications with associated diagnosis above.  Reviewed disease process and find this medication to be necessary, will not change dose or alter current therapy.    Patient discussed with Dr. VEvette Doffing -JGilberto Better PRossInternal Medicine Pager: [phone]

## 2020-06-23 NOTE — Telephone Encounter (Signed)
Please let me know if we need new orders for the nebulizer

## 2020-06-24 NOTE — Progress Notes (Signed)
Internal Medicine Clinic Attending  Case discussed with Dr. Lee  At the time of the visit.  We reviewed the resident's history and exam and pertinent patient test results.  I agree with the assessment, diagnosis, and plan of care documented in the resident's note.    

## 2020-06-25 ENCOUNTER — Telehealth: Payer: Self-pay | Admitting: Student

## 2020-06-25 NOTE — Telephone Encounter (Signed)
Community message sent to Skeet Latch at [employer] for nebulizer/supplies with Ref # [id number]. F2F was 06/23/2020.   Hubbard Hartshorn, BSN, RN-BC

## 2020-06-25 NOTE — Telephone Encounter (Signed)
Pt is wanting a nurse to callback, because she still having side affects from the bee sting [phone]

## 2020-06-25 NOTE — Telephone Encounter (Signed)
Theophilus Bones, RN; Rockne Coons, Delaney Meigs, [place]; [place], [place], [place]   Thank you!

## 2020-06-25 NOTE — Telephone Encounter (Signed)
RTC, VM obtained and [place] compliant message left that nurse returning her call and to call back. SChaplin, RN,BSN

## 2020-06-25 NOTE — Telephone Encounter (Signed)
Pt wanted to give a reference number [id number] for nebulizer [phone]

## 2020-06-26 ENCOUNTER — Other Ambulatory Visit: Payer: Self-pay

## 2020-06-26 ENCOUNTER — Emergency Department (HOSPITAL_COMMUNITY): Payer: Medicaid Other

## 2020-06-26 ENCOUNTER — Encounter (HOSPITAL_COMMUNITY): Payer: Self-pay | Admitting: Pediatrics

## 2020-06-26 ENCOUNTER — Emergency Department (HOSPITAL_COMMUNITY)
Admission: EM | Admit: 2020-06-26 | Discharge: 2020-06-26 | Disposition: A | Discharge status: Home / Self Care | Payer: Medicaid Other | Attending: Emergency Medicine | Admitting: Emergency Medicine

## 2020-06-26 DIAGNOSIS — J45901 Unspecified asthma with (acute) exacerbation: Secondary | ICD-10-CM | POA: Insufficient documentation

## 2020-06-26 DIAGNOSIS — J449 Chronic obstructive pulmonary disease, unspecified: Secondary | ICD-10-CM | POA: Diagnosis not present

## 2020-06-26 DIAGNOSIS — Z803 Family history of malignant neoplasm of breast: Secondary | ICD-10-CM | POA: Diagnosis not present

## 2020-06-26 DIAGNOSIS — Z800 Family history of malignant neoplasm of digestive organs: Secondary | ICD-10-CM | POA: Insufficient documentation

## 2020-06-26 DIAGNOSIS — G459 Transient cerebral ischemic attack, unspecified: Secondary | ICD-10-CM | POA: Diagnosis not present

## 2020-06-26 DIAGNOSIS — R0602 Shortness of breath: Secondary | ICD-10-CM | POA: Diagnosis present

## 2020-06-26 DIAGNOSIS — Z87891 Personal history of nicotine dependence: Secondary | ICD-10-CM | POA: Insufficient documentation

## 2020-06-26 LAB — BASIC METABOLIC PANEL
Anion gap: 8 (ref 5–15)
BUN: 16 mg/dL (ref 6–20)
CO2: 25 mmol/L (ref 22–32)
Calcium: 8.6 mg/dL — ABNORMAL LOW (ref 8.9–10.3)
Chloride: 109 mmol/L (ref 98–111)
Creatinine, Ser: 1.06 mg/dL — ABNORMAL HIGH (ref 0.44–1.00)
GFR calc Af Amer: >60 mL/min (ref >60)
GFR calc non Af Amer: 57 mL/min — ABNORMAL LOW (ref >60)
Glucose, Bld: 86 mg/dL (ref 70–99)
Potassium: 3.7 mmol/L (ref 3.5–5.1)
Sodium: 142 mmol/L (ref 135–145)

## 2020-06-26 LAB — CBC
HCT: 45 % (ref 36.0–46.0)
Hemoglobin: 14.2 g/dL (ref 12.0–15.0)
MCH: 28.3 pg (ref 26.0–34.0)
MCHC: 31.6 g/dL (ref 30.0–36.0)
MCV: 89.8 fL (ref 80.0–100.0)
Platelets: 346 10*3/uL (ref 150–400)
RBC: 5.01 MIL/uL (ref 3.87–5.11)
RDW: 13.5 % (ref 11.5–15.5)
WBC: 8 10*3/uL (ref 4.0–10.5)
nRBC: 0 % (ref 0.0–0.2)

## 2020-06-26 MED ORDER — DIPHENHYDRAMINE HCL 50 MG/ML IJ SOLN
25.0000 mg | Freq: Once | INTRAMUSCULAR | Status: AC
  Administered 2020-06-26: 25 mg via INTRAVENOUS
  Filled 2020-06-26: qty 1

## 2020-06-26 MED ORDER — AZITHROMYCIN 250 MG PO TABS
ORAL_TABLET | ORAL | 0 refills | Status: DC
Start: 2020-06-26 — End: 2020-08-27

## 2020-06-26 MED ORDER — ALBUTEROL SULFATE HFA 108 (90 BASE) MCG/ACT IN AERS
2.0000 | INHALATION_SPRAY | RESPIRATORY_TRACT | Status: DC | PRN
  Administered 2020-06-26: 2 via RESPIRATORY_TRACT
  Filled 2020-06-26: qty 6.7

## 2020-06-26 NOTE — ED Notes (Addendum)
Pt removed own IV, left before receiving d/c paperwork, unable to obtain signature.

## 2020-06-26 NOTE — Telephone Encounter (Signed)
Call from pt - stated she has not had a breathing tx since Monday except when she came to our office on Monday; also needs nebulizer machine. Also stated she was stung by a bee,used her Epi pen but arm still swollen. Stated she go to the ER not UC if breathing worsens.  I talked to Lauren about neb machine - she will f/u. Called pt back but no answer.

## 2020-06-26 NOTE — Telephone Encounter (Signed)
Attempted to relay info below to patient. Returned call to patient. No answer. Left message on VM requesting return call. Hubbard Hartshorn, RN, BSN

## 2020-06-26 NOTE — Telephone Encounter (Signed)
Thank you for following up on her nebulizer delivery. I agree that if her breathing worsens in setting of recent bee sting, she should be evaluated in ER. Thank you!

## 2020-06-26 NOTE — Telephone Encounter (Signed)
Thank you :)

## 2020-06-26 NOTE — ED Provider Notes (Signed)
[facility] Emergency Department Provider Note MRN:  [id number]  Arrival date & time: 06/26/20     Chief Complaint   Shortness of breath History of Present Illness   Kathleen [name] is a 59 y.o. year-old female with a history of bipolar disorder, asthma presenting to the ED with chief complaint of shortness of breath  Patient explains she was recently admitted to the hospital for asthma exacerbation.  She has been taking steroids as an outpatient.  She was stung by bee yesterday in the left arm and has noted a flareup of her symptoms.  Shortness of breath, wheezing, dyspnea exertion.  Denies chest pain, abdominal pain, no nausea or vomiting.  Has had laryngitis for loss of voice for the past several days.  Review of Systems  A complete 10 system review of systems was obtained and all systems are negative except as noted in the HPI and PMH.   Patient's Health History    Past Medical History:  Diagnosis Date  . Anxiety   . Arthritis    hands  . Asthma    2 sets of PFT's in 04/09 without sign variability. Last set with significant decrease in FEV1 with saline alone, suggesting Asthma but  recommended clinical corelation   . Asthma   . Bipolar 1 disorder [facility])    therapist is Caryl Asp and is followed by [employer]  . Blackout    negative work up including ESR, ANA, opthalmology referral, carotid dopplers, 2D echo, MRI and EEG.  . BREAST LUMP 03/25/2008   Annotation: bilaterally Qualifier: Diagnosis of  By: Tomasa Hosteller MD, Edmon Crape.   . 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 Bronchitis   . Chronic headache   . Chronic low back pain 08/08/2008   Qualifier: Diagnosis of  By: Redmond Pulling  MD, Mateo Flow    . Chronic pain    normal work up including TSH, RPR, B12, HIV, plain films, 2 ESR's, ANA, CK, RF along with routine CBC, CMET and UA. Further work up includes CRP, ESR, SPEP/UPEP, hepatitis erology, A1C , repeat ANA    . COPD (chronic obstructive pulmonary disease) ([place])   . COPD exacerbation ([place]) 03/31/2019  . Depression   . DUB (dysfunctional uterine bleeding)    and pelvic pain, with negative endometrial bx in 07/09 and transvaginal U/S significant for mild fibroids in 0/09.  . Emphysema of lung ([place])   . GERD (gastroesophageal reflux disease)   . Hallucinations 03/18/2008   Qualifier: Diagnosis of  By: Redmond Pulling  MD, Mateo Flow    . Hyperlipidemia   . Lower extremity edema    Neg ABI's, normal 2D echo, normal albumin  . Menorrhagia   . Ovarian cyst   . Polysubstance abuse ([place])    none since March 17,2009.  Marland Kitchen Sleep apnea    NO CPAP  . 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 Tubular adenoma of colon     Past Surgical History:  Procedure Laterality Date  . CESAREAN SECTION    . CESAREAN SECTION    . COLONOSCOPY    . HERNIA REPAIR    . Left partial oophorectomy    . OOPHORECTOMY     1/2 ovary removed  . POLYPECTOMY    . VAGINA SURGERY     mesh    Family History  Problem Relation Age of Onset  . Colon cancer  Mother 28  . Breast cancer Mother 52  . Rectal cancer Mother   . Diabetes Father   . Hypertension Father   . Kidney disease Father   . Colon cancer Father 35  . Prostate cancer Father   . Cancer Father   . Kidney disease Sister   . Cancer Brother   . Breast cancer Maternal Grandmother 103  . Esophageal cancer Neg Hx   . Stomach cancer Neg Hx     Social History   Socioeconomic History  . Marital status: Single    Spouse name: Not on file  . Number of children: Not on file  . Years of education: Not on file  . Highest education level: Not on file  Occupational History  . Not on file  Tobacco Use  . Smoking status: Former Smoker    Packs/day: 0.25    Years: 30.00    Pack years: 7.50    Types: Cigarettes    Quit date: 12/28/2016    Years since quitting: 3.4  . Smokeless tobacco: Never Used  . Tobacco comment: Occ. Wants Nicotine  Patches   Vaping Use  . Vaping Use: Never used  Substance and Sexual Activity  . Alcohol use: Not Currently    Alcohol/week: 0.0 standard drinks    Comment: rarely  . Drug use: Yes    Types: Marijuana    Comment: Sometimes.  . Sexual activity: Yes    Birth control/protection: None    Comment: monogamous  Other Topics Concern  . Not on file  Social History Narrative   ** Merged History Encounter **       Lives between [place] and boyfriend's house , currently trying to abstain from illegal substances, continues to smoke and says nicotine patch made her sick.   Social Determinants of Health   Financial Resource Strain:   . Difficulty of Paying Living Expenses:   Food Insecurity:   . Worried About Charity fundraiser in the Last Year:   . Arboriculturist in the Last Year:   Transportation Needs:   . Film/video editor (Medical):   Marland Kitchen Lack of Transportation (Non-Medical):   Physical Activity:   . Days of Exercise per Week:   . Minutes of Exercise per Session:   Stress:   . Feeling of Stress :   Social Connections:   . Frequency of Communication with Friends and Family:   . Frequency of Social Gatherings with Friends and Family:   . Attends Religious Services:   . Active Member of Clubs or Organizations:   . Attends Archivist Meetings:   Marland Kitchen Marital Status:   Intimate Partner Violence:   . Fear of Current or Ex-Partner:   . Emotionally Abused:   Marland Kitchen Physically Abused:   . Sexually Abused:      Physical Exam   Vitals:   06/26/20 1230 06/26/20 1300  BP: 115/83 125/88  Pulse: 82 71  Resp: 19 20  Temp:    SpO2: 97% 99%    CONSTITUTIONAL: Well-appearing, NAD NEURO:  Alert and oriented x 3, no focal deficits EYES:  eyes equal and reactive ENT/NECK:  no LAD, no JVD, raspy voice CARDIO: Regular rate, well-perfused, normal S1 and S2 PULM:  CTAB no wheezing or rhonchi GI/GU:  normal bowel sounds, non-distended, non-tender MSK/SPINE:  No gross  deformities, no edema SKIN:  no rash, atraumatic PSYCH:  Appropriate speech and behavior  *Additional and/or pertinent findings included in MDM below  Diagnostic  and Interventional Summary    EKG Interpretation  Date/Time:  June 26, 2020 at 12: 22: 23 Ventricular Rate:  70 PR Interval:    QRS Duration: 90 QT Interval:  413 QTC Calculation: 446   R Axis:     Text Interpretation: Sinus rhythm, no significant change from prior Confirm by Dr. Gerlene Fee at 2:42 PM      Labs Reviewed  BASIC METABOLIC PANEL - Abnormal; Notable for the following components:      Result Value   Creatinine, Ser 1.06 (*)    Calcium 8.6 (*)    GFR calc non Af Amer 57 (*)    All other components within normal limits  SARS CORONAVIRUS 2 BY RT PCR (HOSPITAL ORDER, [facility])  CBC    DG Chest Port 1 View  Final Result      Medications  albuterol (VENTOLIN HFA) 108 (90 Base) MCG/ACT inhaler 2 puff (2 puffs Inhalation Given 06/26/20 1229)  diphenhydrAMINE (BENADRYL) injection 25 mg (25 mg Intravenous Given 06/26/20 1227)     Procedures  /  Critical Care Procedures  ED Course and Medical Decision Making  I have reviewed the triage vital signs, the nursing notes, and pertinent available records from the EMR.  Listed above are laboratory and imaging tests that I personally ordered, reviewed, and interpreted and then considered in my medical decision making (see below for details).      Suspect exacerbation of asthma, patient is without distress, voice change seems consistent with laryngitis and has been present for multiple days, doubt acute emergent allergic reaction.  Patient was stung by bee yesterday and took her EpiPen yesterday.  Does not seem to have helped.  Providing breathing treatments, work-up pending.  On reassessment patient is in no acute distress, lung exam mildly improved, satting 100%, no indication for admission work-up reassuring, appropriate for  discharge.  Will add on azithromycin.  Barth Kirks. [place]onia Small, [place] [email or web address]  Final Clinical Impressions(s) / ED Diagnoses     ICD-10-CM   1. Asthma with acute exacerbation, unspecified asthma severity, unspecified whether persistent  J45.901     ED Discharge Orders         Ordered    azithromycin (ZITHROMAX) 250 MG tablet     Discontinue  Reprint     06/26/20 1439           Discharge Instructions Discussed with and Provided to Patient:     Discharge Instructions     You were evaluated in the Emergency Department and after careful evaluation, we did not find any emergent condition requiring admission or further testing in the hospital.  Your exam/testing today is overall reassuring.  Symptoms seem to be due to continued asthma flare.  Please complete your course of prednisone at home and use the azithromycin antibiotic as directed.  Please return to the Emergency Department if you experience any worsening of your condition.   Thank you for allowing [place] to be a part of your care.       Maudie Flakes, MD 06/26/20 [phone]

## 2020-06-26 NOTE — ED Triage Notes (Signed)
Patient c/o allergic reaction to bees yesterday and used an epi pen. C/o redness on left arm and difficulty breathing. Patient also endorsed cough and congestion recently and was seen at [facility] for this issue.

## 2020-06-26 NOTE — ED Notes (Signed)
Pt refusing covid swab at this time. States she had one on Sunday or Tuesday. Provider made aware.

## 2020-06-26 NOTE — Telephone Encounter (Signed)
Theophilus Bones, RN; Sandi Raveling, [employer]; [place], Trish Fountain, [place]; Jari Pigg!   One of our CSR's called the patient yesterday but was only able to leave a voice mail. The equipment is scheduled to drop ship since we were not able to get in touch with her. She should get it in 1-2 business days. However if she wants to go to the retail store she can pick it up today.   Thanks    ----- Message -----  From: Velora Heckler, RN  Sent: 06/26/2020  9:21 AM EDT  To: Darlina Guys, Roland Earl, *  Subject: RE: nebulizer/supplies              Hi Leah,   Patient has not had a treatment in 3 days. Any idea when she will receive her nebulizer and supplies?

## 2020-06-26 NOTE — Discharge Instructions (Addendum)
You were evaluated in the Emergency Department and after careful evaluation, we did not find any emergent condition requiring admission or further testing in the hospital.  Your exam/testing today is overall reassuring.  Symptoms seem to be due to continued asthma flare.  Please complete your course of prednisone at home and use the azithromycin antibiotic as directed.  Please return to the Emergency Department if you experience any worsening of your condition.   Thank you for allowing [place] to be a part of your care.

## 2020-06-26 NOTE — Telephone Encounter (Signed)
Spoke w/ pt ask her to go to ED for possible reaction asthma/ bee sting. She is agreeable. Ask her to call 911, wheezing and coughing severe from listening point. She states she will have son bring her.

## 2020-06-27 ENCOUNTER — Telehealth: Payer: Self-pay

## 2020-06-27 NOTE — Telephone Encounter (Signed)
Returned call to patient. No answer. Left message on VM requesting return call. L. Elzie Knisley, RN, BSN     

## 2020-06-27 NOTE — Telephone Encounter (Signed)
Please call pt back regarding nebulizer machine and solution.

## 2020-06-29 NOTE — ED Provider Notes (Signed)
[facility] DEPT Provider Note   CSN: [id number] Arrival date & time: 06/22/20  1130     History Chief Complaint  Patient presents with  . Shortness of Breath    Kathleen [name] is a 59 y.o. female.  HPI   60 year old female with shortness of breath.  History of asthma.  Feels like she is having exacerbation.  Worsened throughout the day today.  No fevers or chills.  Chest feels tight.  No pain per se though.  No unusual leg swelling.  No fevers or chills.  Past Medical History:  Diagnosis Date  . Anxiety   . Arthritis    hands  . Asthma    2 sets of PFT's in 04/09 without sign variability. Last set with significant decrease in FEV1 with saline alone, suggesting Asthma but  recommended clinical corelation   . Asthma   . Bipolar 1 disorder [facility])    therapist is Caryl Asp and is followed by [employer]  . Blackout    negative work up including ESR, ANA, opthalmology referral, carotid dopplers, 2D echo, MRI and EEG.  . BREAST LUMP 03/25/2008   Annotation: bilaterally Qualifier: Diagnosis of  By: Tomasa Hosteller MD, Edmon Crape.   . 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 Bronchitis   . Chronic headache   . Chronic low back pain 08/08/2008   Qualifier: Diagnosis of  By: Redmond Pulling  MD, Mateo Flow    . Chronic pain    normal work up including TSH, RPR, B12, HIV, plain films, 2 ESR's, ANA, CK, RF along with routine CBC, CMET and UA. Further work up includes CRP, ESR, SPEP/UPEP, hepatitis erology, A1C , repeat ANA  . COPD (chronic obstructive pulmonary disease) ([place])   . COPD exacerbation ([place]) 03/31/2019  . Depression   . DUB (dysfunctional uterine bleeding)    and pelvic pain, with negative endometrial bx in 07/09 and transvaginal U/S significant for mild fibroids in 0/09.  . Emphysema of lung ([place])   . GERD (gastroesophageal reflux disease)   . Hallucinations 03/18/2008   Qualifier: Diagnosis of  By: Redmond Pulling  MD,  Mateo Flow    . Hyperlipidemia   . Lower extremity edema    Neg ABI's, normal 2D echo, normal albumin  . Menorrhagia   . Ovarian cyst   . Polysubstance abuse ([place])    none since March 17,2009.  Marland Kitchen Sleep apnea    NO CPAP  . 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 Tubular adenoma of colon     Patient Active Problem List   Diagnosis Date Noted  . 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 Daytime somnolence 04/06/2019  . Lumbar radiculopathy 07/26/2016  . Depression 09/19/2015  . Preventative health care 06/18/2014  . 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 GERD 01/21/2010  . Sacral back pain 11/27/2008  . Generalized anxiety disorder 03/14/2008  . Migraine variant 03/14/2008    Past Surgical History:  Procedure Laterality Date  . CESAREAN SECTION    . CESAREAN SECTION    . COLONOSCOPY    . HERNIA REPAIR    . Left partial oophorectomy    . OOPHORECTOMY     1/2 ovary removed  . POLYPECTOMY    . VAGINA SURGERY     mesh     OB History    Gravida  3   Para  2  Term  2   Preterm  0   AB  1   Living  2     SAB  0   TAB  1   Ectopic  0   Multiple      Live Births  2           Family History  Problem Relation Age of Onset  . Colon cancer Mother 64  . Breast cancer Mother 62  . Rectal cancer Mother   . Diabetes Father   . Hypertension Father   . Kidney disease Father   . Colon cancer Father 33  . Prostate cancer Father   . Cancer Father   . Kidney disease Sister   . Cancer Brother   . Breast cancer Maternal Grandmother 103  . Esophageal cancer Neg Hx   . Stomach cancer Neg Hx     Social History   Tobacco Use  . Smoking status: Former Smoker    Packs/day: 0.25    Years: 30.00    Pack years: 7.50    Types: Cigarettes    Quit date: 12/28/2016    Years since quitting: 3.5  . Smokeless tobacco: Never Used  . Tobacco comment: Occ. Wants Nicotine Patches   Vaping Use  . Vaping Use: Never used    Substance Use Topics  . Alcohol use: Not Currently    Alcohol/week: 0.0 standard drinks    Comment: rarely  . Drug use: Yes    Types: Marijuana    Comment: Sometimes.    Home Medications Prior to Admission medications   Medication Sig Start Date End Date Taking? Authorizing Provider  acetaminophen (TYLENOL) 325 MG tablet Take 2 tablets (650 mg total) by mouth every 6 (six) hours as needed for moderate pain. 03/20/20 03/20/21 Yes Maudie Mercury, MD  albuterol (PROVENTIL) (2.5 MG/3ML) 0.083% nebulizer solution Take 3 mLs (2.5 mg total) by nebulization every 6 (six) hours as needed for wheezing or shortness of breath. 05/22/20  Yes [place]olo, Adam, DO  albuterol (VENTOLIN HFA) 108 (90 Base) MCG/ACT inhaler Inhale 2 puffs into the lungs every 4 (four) hours as needed for wheezing or shortness of breath. 06/12/20  Yes Mosetta Anis, MD  ASPIRIN ADULT LOW STRENGTH 81 MG EC tablet TAKE 1 Tablet BY MOUTH ONCE DAILY Patient taking differently: Take 81 mg by mouth daily.  12/03/19  Yes Helberg, Larkin Ina, MD  EPINEPHrine 0.3 mg/0.3 mL IJ SOAJ injection Inject 0.3 mLs (0.3 mg total) into the muscle as needed for anaphylaxis. 06/12/20  Yes Mosetta Anis, MD  lidocaine (LIDODERM) 5 % Place 1 patch onto the skin daily. Remove & Discard patch within 12 hours or as directed by MD 06/12/20  Yes Mosetta Anis, MD  mometasone-formoterol [facility]) 200-5 MCG/ACT AERO Inhale 2 puffs into the lungs 2 (two) times daily. IM Program 06/12/20  Yes Mosetta Anis, MD  tiotropium (SPIRIVA HANDIHALER) 18 MCG inhalation capsule Place 1 capsule (18 mcg total) into inhaler and inhale daily. 06/12/20  Yes Mosetta Anis, MD  azithromycin (ZITHROMAX) 250 MG tablet Take 2 tablets together on the first day, then 1 every day until finished. 06/26/20   Maudie Flakes, MD  benzonatate (TESSALON) 100 MG capsule Take 1 capsule (100 mg total) by mouth every 8 (eight) hours. 06/22/20   Virgel Manifold, MD  diclofenac Sodium (VOLTAREN) 1 % GEL Apply 2 g  topically 4 (four) times daily. Patient not taking: Reported on 06/22/2020 03/21/20   Maudie Mercury, MD  loratadine (  CLARITIN) 10 MG tablet Take 1 tablet (10 mg total) by mouth daily. Patient not taking: Reported on 06/22/2020 04/26/19   Forde Dandy, PharmD  montelukast (SINGULAIR) 10 MG tablet Take 1 tablet (10 mg total) by mouth at bedtime. 06/23/20   Mosetta Anis, MD  ondansetron (ZOFRAN) 4 MG tablet Take 1 tablet (4 mg total) by mouth daily as needed for nausea or vomiting. 06/23/20 06/23/21  Mosetta Anis, MD  pantoprazole (PROTONIX) 40 MG tablet Take 1 tablet (40 mg total) by mouth daily. 06/23/20   Mosetta Anis, MD  predniSONE (DELTASONE) 20 MG tablet Take 2 tablets (40 mg total) by mouth daily. 06/23/20   Mosetta Anis, MD  sertraline (ZOLOFT) 100 MG tablet Take 1 tablet (100 mg total) by mouth daily. 06/23/20   Mosetta Anis, MD  divalproex (DEPAKOTE ER) 500 MG 24 hr tablet Take 1 tablet (500 mg total) by mouth 2 (two) times daily. Patient not taking: Reported on 10/08/2019 08/09/19 03/23/20  [name], MD  fluticasone [facility]) 50 MCG/ACT nasal spray Place 1 spray into both nostrils daily. Patient not taking: Reported on 07/11/2019 04/26/19 03/23/20  Forde Dandy, PharmD  ipratropium-albuterol (DUONEB) 0.5-2.5 (3) MG/3ML SOLN Take 3 mLs by nebulization every 6 (six) hours as needed (wheezing or shortness of breath). Patient not taking: Reported on 03/19/2020 06/09/19 03/23/20  [name], MD  metoCLOPramide (REGLAN) 5 MG tablet Take 1 tablet (5 mg total) by mouth 3 (three) times daily before meals for 5 days. Patient not taking: Reported on 03/19/2020 07/17/19 03/23/20  [name], MD  SUMAtriptan (IMITREX) 25 MG tablet Take 1 tablet (25 mg total) by mouth every 2 (two) hours as needed for migraine or headache. Patient not taking: Reported on 10/08/2019 07/24/19 03/23/20  [name], DO    Allergies    Bee venom, Topiramate, and Tramadol  Review of Systems     Review of Systems All systems reviewed and negative, other than as noted in HPI.  Physical Exam Updated Vital Signs BP 112/79   Pulse 92   Temp 98.2 F (36.8 C)   Resp 19   LMP 11/08/2014   SpO2 98%   Physical Exam Vitals and nursing note reviewed.  Constitutional:      General: She is not in acute distress.    Appearance: She is well-developed.  HENT:     Head: Normocephalic and atraumatic.  Eyes:     General:        Right eye: No discharge.        Left eye: No discharge.     Conjunctiva/sclera: Conjunctivae normal.  Cardiovascular:     Rate and Rhythm: Normal rate and regular rhythm.     Heart sounds: Normal heart sounds. No murmur heard.  No friction rub. No gallop.   Pulmonary:     Effort: Respiratory distress present.     Breath sounds: Wheezing present.     Comments: Tachypnea.  Wheezing. Abdominal:     General: There is no distension.     Palpations: Abdomen is soft.     Tenderness: There is no abdominal tenderness.  Musculoskeletal:        General: No tenderness.     Cervical back: Neck supple.  Skin:    General: Skin is warm and dry.  Neurological:     Mental Status: She is alert.  Psychiatric:        Behavior: Behavior normal.  Thought Content: Thought content normal.     ED Results / Procedures / Treatments   Labs (all labs ordered are listed, but only abnormal results are displayed) Labs Reviewed  BASIC METABOLIC PANEL - Abnormal; Notable for the following components:      Result Value   Creatinine, Ser 1.18 (*)    GFR calc non Af Amer 50 (*)    GFR calc Af Amer 58 (*)    All other components within normal limits  SARS CORONAVIRUS 2 BY RT PCR (HOSPITAL ORDER, [facility])  CBC WITH DIFFERENTIAL/PLATELET  POC SARS CORONAVIRUS 2 AG -  ED  TROPONIN I (HIGH SENSITIVITY)  TROPONIN I (HIGH SENSITIVITY)    EKG EKG Interpretation  Date/Time:  _0 /11/21 1458           Virgel Manifold, MD 06/29/20 1225

## 2020-07-01 ENCOUNTER — Encounter: Payer: Self-pay | Admitting: *Deleted

## 2020-07-03 ENCOUNTER — Other Ambulatory Visit: Payer: Self-pay | Admitting: *Deleted

## 2020-07-03 MED ORDER — ALBUTEROL SULFATE HFA 108 (90 BASE) MCG/ACT IN AERS: 1.0000 | INHALATION_SPRAY | Freq: Four times a day (QID) | RESPIRATORY_TRACT | 1 refills | Status: DC | PRN

## 2020-07-03 NOTE — Telephone Encounter (Signed)
Fax from [facility] -  Albuterol 108 mcg inhaler is not covered by [employer]. Please send new rx for ProAir. Thanks

## 2020-07-03 NOTE — Telephone Encounter (Signed)
Rx for ProAir sent to pharmacy.

## 2020-07-29 ENCOUNTER — Encounter (HOSPITAL_COMMUNITY): Payer: Self-pay | Admitting: *Deleted

## 2020-07-29 NOTE — Progress Notes (Signed)
Received referral from Dr. Georjean Mode with [facility] for this pt to participate in pulmonary rehab with the the diagnosis of Asthma Clinical review of pt follow up appt on 7/12 Primary MD office note. Pt with brief ER visit for bee sting/shortness of breath.  Pt left without discharge paperwork.  No follow up recommended by ED MD.   Pt with Covid Risk Score - 1. Pt appropriate for scheduling for Pulmonary rehab within the Medicaid guidelines for 15 sessions.  Will forward to pulmonary rehab staff  for scheduling. Cherre Huger, BSN Cardiac and Training and development officer

## 2020-08-07 ENCOUNTER — Telehealth (HOSPITAL_COMMUNITY): Payer: Self-pay

## 2020-08-14 ENCOUNTER — Emergency Department (HOSPITAL_COMMUNITY): Payer: Medicaid Other

## 2020-08-14 ENCOUNTER — Other Ambulatory Visit: Payer: Self-pay

## 2020-08-14 ENCOUNTER — Emergency Department (HOSPITAL_COMMUNITY)
Admission: EM | Admit: 2020-08-14 | Discharge: 2020-08-14 | Disposition: A | Discharge status: Home / Self Care | Payer: Medicaid Other | Attending: Emergency Medicine | Admitting: Emergency Medicine

## 2020-08-14 DIAGNOSIS — Z7982 Long term (current) use of aspirin: Secondary | ICD-10-CM | POA: Diagnosis not present

## 2020-08-14 DIAGNOSIS — J45909 Unspecified asthma, uncomplicated: Secondary | ICD-10-CM | POA: Diagnosis not present

## 2020-08-14 DIAGNOSIS — Z20822 Contact with and (suspected) exposure to covid-19: Secondary | ICD-10-CM | POA: Insufficient documentation

## 2020-08-14 DIAGNOSIS — R0603 Acute respiratory distress: Secondary | ICD-10-CM

## 2020-08-14 DIAGNOSIS — Z79899 Other long term (current) drug therapy: Secondary | ICD-10-CM | POA: Insufficient documentation

## 2020-08-14 DIAGNOSIS — J441 Chronic obstructive pulmonary disease with (acute) exacerbation: Secondary | ICD-10-CM | POA: Diagnosis not present

## 2020-08-14 DIAGNOSIS — Z87891 Personal history of nicotine dependence: Secondary | ICD-10-CM | POA: Diagnosis not present

## 2020-08-14 LAB — COMPREHENSIVE METABOLIC PANEL
ALT: 30 U/L (ref 0–44)
AST: 30 U/L (ref 15–41)
Albumin: 3.9 g/dL (ref 3.5–5.0)
Alkaline Phosphatase: 71 U/L (ref 38–126)
Anion gap: 8 (ref 5–15)
BUN: 8 mg/dL (ref 6–20)
CO2: 25 mmol/L (ref 22–32)
Calcium: 9.1 mg/dL (ref 8.9–10.3)
Chloride: 109 mmol/L (ref 98–111)
Creatinine, Ser: 0.96 mg/dL (ref 0.44–1.00)
GFR calc Af Amer: >60 mL/min (ref >60)
GFR calc non Af Amer: >60 mL/min (ref >60)
Glucose, Bld: 96 mg/dL (ref 70–99)
Potassium: 3.8 mmol/L (ref 3.5–5.1)
Sodium: 142 mmol/L (ref 135–145)
Total Bilirubin: 0.3 mg/dL (ref 0.3–1.2)
Total Protein: 6.9 g/dL (ref 6.5–8.1)

## 2020-08-14 LAB — CBC WITH DIFFERENTIAL/PLATELET
Abs Immature Granulocytes: 0.01 10*3/uL (ref 0.00–0.07)
Basophils Absolute: 0.1 10*3/uL (ref 0.0–0.1)
Basophils Relative: 1 %
Eosinophils Absolute: 0.8 10*3/uL — ABNORMAL HIGH (ref 0.0–0.5)
Eosinophils Relative: 9 %
HCT: 45.1 % (ref 36.0–46.0)
Hemoglobin: 14.6 g/dL (ref 12.0–15.0)
Immature Granulocytes: 0 %
Lymphocytes Relative: 28 %
Lymphs Abs: 2.3 10*3/uL (ref 0.7–4.0)
MCH: 28 pg (ref 26.0–34.0)
MCHC: 32.4 g/dL (ref 30.0–36.0)
MCV: 86.4 fL (ref 80.0–100.0)
Monocytes Absolute: 0.9 10*3/uL (ref 0.1–1.0)
Monocytes Relative: 11 %
Neutro Abs: 4.1 10*3/uL (ref 1.7–7.7)
Neutrophils Relative %: 51 %
Platelets: 310 10*3/uL (ref 150–400)
RBC: 5.22 MIL/uL — ABNORMAL HIGH (ref 3.87–5.11)
RDW: 13.4 % (ref 11.5–15.5)
WBC: 8.2 10*3/uL (ref 4.0–10.5)
nRBC: 0 % (ref 0.0–0.2)

## 2020-08-14 LAB — BRAIN NATRIURETIC PEPTIDE: B Natriuretic Peptide: 28.2 pg/mL (ref 0.0–100.0)

## 2020-08-14 LAB — SARS CORONAVIRUS 2 BY RT PCR (HOSPITAL ORDER, PERFORMED IN ~~LOC~~ HOSPITAL LAB): SARS Coronavirus 2: NEGATIVE

## 2020-08-14 MED ORDER — PREDNISONE 20 MG PO TABS: 40.0000 mg | ORAL_TABLET | Freq: Every day | ORAL | 0 refills | Status: DC

## 2020-08-14 MED ORDER — MAGNESIUM SULFATE 2 GM/50ML IV SOLN
2.0000 g | INTRAVENOUS | Status: AC
  Administered 2020-08-14: 2 g via INTRAVENOUS
  Filled 2020-08-14: qty 50

## 2020-08-14 MED ORDER — METHYLPREDNISOLONE SODIUM SUCC 125 MG IJ SOLR
125.0000 mg | Freq: Once | INTRAMUSCULAR | Status: AC
  Administered 2020-08-14: 125 mg via INTRAVENOUS
  Filled 2020-08-14: qty 2

## 2020-08-14 MED ORDER — KETOROLAC TROMETHAMINE 15 MG/ML IJ SOLN
15.0000 mg | Freq: Once | INTRAMUSCULAR | Status: AC
  Administered 2020-08-14: 15 mg via INTRAVENOUS
  Filled 2020-08-14: qty 1

## 2020-08-14 MED ORDER — IPRATROPIUM-ALBUTEROL 0.5-2.5 (3) MG/3ML IN SOLN
3.0000 mL | Freq: Once | RESPIRATORY_TRACT | Status: AC
  Administered 2020-08-14: 3 mL via RESPIRATORY_TRACT
  Filled 2020-08-14: qty 3

## 2020-08-14 MED ORDER — ALBUTEROL SULFATE HFA 108 (90 BASE) MCG/ACT IN AERS
2.0000 | INHALATION_SPRAY | Freq: Once | RESPIRATORY_TRACT | Status: AC
  Administered 2020-08-14: 2 via RESPIRATORY_TRACT
  Filled 2020-08-14: qty 6.7

## 2020-08-14 MED ORDER — ALBUTEROL SULFATE (2.5 MG/3ML) 0.083% IN NEBU: 2.5000 mg | INHALATION_SOLUTION | RESPIRATORY_TRACT | Status: DC | PRN

## 2020-08-14 MED ORDER — ALBUTEROL (5 MG/ML) CONTINUOUS INHALATION SOLN
10.0000 mg/h | INHALATION_SOLUTION | Freq: Once | RESPIRATORY_TRACT | Status: AC
  Administered 2020-08-14: 10 mg/h via RESPIRATORY_TRACT
  Filled 2020-08-14: qty 20

## 2020-08-14 NOTE — Discharge Instructions (Signed)
As discussed, with your presentation today for respiratory distress, and diagnosis of COPD exacerbation is important you monitor your condition carefully and do not hesitate to return here for any concerning changes.  Otherwise, keep your appointment as scheduled next week.  In the interim, please use your albuterol every 4 hours for the next 2 days then as needed.  In addition, please take your prednisone as prescribed.

## 2020-08-14 NOTE — Progress Notes (Signed)
Spoke with MD, Vanita Panda about pt BiPAP, MD stated okay to leave pt off at this time and will be reevaluated in about an hour to see pt progress. RT will continue to monitor.

## 2020-08-14 NOTE — ED Notes (Addendum)
Pt arrived via walk in, asthma attack, states she ran out of medications and inhalers at home. Increased work of breathing, only able to speak in short sentences. Tachypenic.Marland Kitchen

## 2020-08-14 NOTE — ED Provider Notes (Signed)
[facility] DEPT Provider Note   CSN: [id number] Arrival date & time: 08/14/20  1039     History Chief Complaint  Patient presents with  . Asthma    Kathleen [name] is a 59 y.o. female.  HPI     Patient presents in respiratory distress.  She has a history of asthma/COPD.  She can provide only minimal details of her current illness, nodding, breathlessly providing single word responses.  It seems as though she has had breathing difficulty since yesterday.  She has had no relief with asthma, seemingly denies pain. Level 5 caveat secondary to acuity of condition.    Past Medical History:  Diagnosis Date  . Anxiety   . Arthritis    hands  . Asthma    2 sets of PFT's in 04/09 without sign variability. Last set with significant decrease in FEV1 with saline alone, suggesting Asthma but  recommended clinical corelation   . Asthma   . Bipolar 1 disorder [facility])    therapist is Caryl Asp and is followed by [employer]  . Blackout    negative work up including ESR, ANA, opthalmology referral, carotid dopplers, 2D echo, MRI and EEG.  . BREAST LUMP 03/25/2008   Annotation: bilaterally Qualifier: Diagnosis of  By: Tomasa Hosteller MD, Edmon Crape.   . 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 Bronchitis   . Chronic headache   . Chronic low back pain 08/08/2008   Qualifier: Diagnosis of  By: Redmond Pulling  MD, Mateo Flow    . Chronic pain    normal work up including TSH, RPR, B12, HIV, plain films, 2 ESR's, ANA, CK, RF along with routine CBC, CMET and UA. Further work up includes CRP, ESR, SPEP/UPEP, hepatitis erology, A1C , repeat ANA  . COPD (chronic obstructive pulmonary disease) ([place])   . COPD exacerbation ([place]) 03/31/2019  . Depression   . DUB (dysfunctional uterine bleeding)    and pelvic pain, with negative endometrial bx in 07/09 and transvaginal U/S significant for mild fibroids in 0/09.  . Emphysema of lung ([place])     . GERD (gastroesophageal reflux disease)   . Hallucinations 03/18/2008   Qualifier: Diagnosis of  By: Redmond Pulling  MD, Mateo Flow    . Hyperlipidemia   . Lower extremity edema    Neg ABI's, normal 2D echo, normal albumin  . Menorrhagia   . Ovarian cyst   . Polysubstance abuse ([place])    none since March 17,2009.  Marland Kitchen Sleep apnea    NO CPAP  . 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 Tubular adenoma of colon     Patient Active Problem List   Diagnosis Date Noted  . 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 Daytime somnolence 04/06/2019  . Lumbar radiculopathy 07/26/2016  . Depression 09/19/2015  . Preventative health care 06/18/2014  . 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 GERD 01/21/2010  . Sacral back pain 11/27/2008  . Generalized anxiety disorder 03/14/2008  . Migraine variant 03/14/2008    Past Surgical History:  Procedure Laterality Date  . CESAREAN SECTION    . CESAREAN SECTION    . COLONOSCOPY    . HERNIA REPAIR    . Left partial oophorectomy    . OOPHORECTOMY     1/2 ovary removed  . POLYPECTOMY    . VAGINA SURGERY     mesh  OB History    Gravida  3   Para  2   Term  2   Preterm  0   AB  1   Living  2     SAB  0   TAB  1   Ectopic  0   Multiple      Live Births  2           Family History  Problem Relation Age of Onset  . Colon cancer Mother 49  . Breast cancer Mother 60  . Rectal cancer Mother   . Diabetes Father   . Hypertension Father   . Kidney disease Father   . Colon cancer Father 65  . Prostate cancer Father   . Cancer Father   . Kidney disease Sister   . Cancer Brother   . Breast cancer Maternal Grandmother 103  . Esophageal cancer Neg Hx   . Stomach cancer Neg Hx     Social History   Tobacco Use  . Smoking status: Former Smoker    Packs/day: 0.25    Years: 30.00    Pack years: 7.50    Types: Cigarettes    Quit date: 12/28/2016    Years since quitting: 3.6  .  Smokeless tobacco: Never Used  . Tobacco comment: Occ. Wants Nicotine Patches   Vaping Use  . Vaping Use: Never used  Substance Use Topics  . Alcohol use: Not Currently    Alcohol/week: 0.0 standard drinks    Comment: rarely  . Drug use: Yes    Types: Marijuana    Comment: Sometimes.    Home Medications Prior to Admission medications   Medication Sig Start Date End Date Taking? Authorizing Provider  acetaminophen (TYLENOL) 325 MG tablet Take 2 tablets (650 mg total) by mouth every 6 (six) hours as needed for moderate pain. 03/20/20 03/20/21  Maudie Mercury, MD  albuterol [facility] HFA) [phone] Base) MCG/ACT inhaler Inhale 1-2 puffs into the lungs every 6 (six) hours as needed for wheezing or shortness of breath. 07/03/20   Aslam, Loralyn Freshwater, MD  albuterol (PROVENTIL) (2.5 MG/3ML) 0.083% nebulizer solution Take 3 mLs (2.5 mg total) by nebulization every 6 (six) hours as needed for wheezing or shortness of breath. 05/22/20   Curatolo, Adam, DO  ASPIRIN ADULT LOW STRENGTH 81 MG EC tablet TAKE 1 Tablet BY MOUTH ONCE DAILY Patient taking differently: Take 81 mg by mouth daily.  12/03/19   [name], MD  azithromycin (ZITHROMAX) 250 MG tablet Take 2 tablets together on the first day, then 1 every day until finished. 06/26/20   Maudie Flakes, MD  benzonatate (TESSALON) 100 MG capsule Take 1 capsule (100 mg total) by mouth every 8 (eight) hours. 06/22/20   Virgel Manifold, MD  diclofenac Sodium (VOLTAREN) 1 % GEL Apply 2 g topically 4 (four) times daily. Patient not taking: Reported on 06/22/2020 03/21/20   Maudie Mercury, MD  EPINEPHrine 0.3 mg/0.3 mL IJ SOAJ injection Inject 0.3 mLs (0.3 mg total) into the muscle as needed for anaphylaxis. 06/12/20   Mosetta Anis, MD  lidocaine (LIDODERM) 5 % Place 1 patch onto the skin daily. Remove & Discard patch within 12 hours or as directed by MD 06/12/20   Mosetta Anis, MD  loratadine (CLARITIN) 10 MG tablet Take 1 tablet (10 mg total) by mouth daily. Patient not  taking: Reported on 06/22/2020 04/26/19   Forde Dandy, PharmD  mometasone-formoterol [facility]) 200-5 MCG/ACT AERO Inhale 2 puffs into the lungs  2 (two) times daily. IM Program 06/12/20   Mosetta Anis, MD  montelukast (SINGULAIR) 10 MG tablet Take 1 tablet (10 mg total) by mouth at bedtime. 06/23/20   Mosetta Anis, MD  ondansetron (ZOFRAN) 4 MG tablet Take 1 tablet (4 mg total) by mouth daily as needed for nausea or vomiting. 06/23/20 06/23/21  Mosetta Anis, MD  pantoprazole (PROTONIX) 40 MG tablet Take 1 tablet (40 mg total) by mouth daily. 06/23/20   Mosetta Anis, MD  predniSONE (DELTASONE) 20 MG tablet Take 2 tablets (40 mg total) by mouth daily. 06/23/20   Mosetta Anis, MD  sertraline (ZOLOFT) 100 MG tablet Take 1 tablet (100 mg total) by mouth daily. 06/23/20   Mosetta Anis, MD  tiotropium (SPIRIVA HANDIHALER) 18 MCG inhalation capsule Place 1 capsule (18 mcg total) into inhaler and inhale daily. 06/12/20   Mosetta Anis, MD  divalproex (DEPAKOTE ER) 500 MG 24 hr tablet Take 1 tablet (500 mg total) by mouth 2 (two) times daily. Patient not taking: Reported on 10/08/2019 08/09/19 03/23/20  [name], MD  fluticasone [facility]) 50 MCG/ACT nasal spray Place 1 spray into both nostrils daily. Patient not taking: Reported on 07/11/2019 04/26/19 03/23/20  Forde Dandy, PharmD  ipratropium-albuterol (DUONEB) 0.5-2.5 (3) MG/3ML SOLN Take 3 mLs by nebulization every 6 (six) hours as needed (wheezing or shortness of breath). Patient not taking: Reported on 03/19/2020 06/09/19 03/23/20  [name], MD  metoCLOPramide (REGLAN) 5 MG tablet Take 1 tablet (5 mg total) by mouth 3 (three) times daily before meals for 5 days. Patient not taking: Reported on 03/19/2020 07/17/19 03/23/20  [name], MD  SUMAtriptan (IMITREX) 25 MG tablet Take 1 tablet (25 mg total) by mouth every 2 (two) hours as needed for migraine or headache. Patient not taking: Reported on 10/08/2019 07/24/19 03/23/20  [name], DO    Allergies    Bee venom, Topiramate, and Tramadol  Review of Systems   Review of Systems  Unable to perform ROS: Acuity of condition    Physical Exam Updated Vital Signs BP (!) 145/100   Pulse (!) 107   Temp 97.9 F (36.6 C) (Oral)   Resp (!) 27   LMP 11/08/2014   SpO2 95%   Physical Exam Vitals and nursing note reviewed.  Constitutional:      General: She is in acute distress.     Appearance: She is well-developed. She is ill-appearing and diaphoretic.  HENT:     Head: Normocephalic and atraumatic.  Eyes:     Conjunctiva/sclera: Conjunctivae normal.  Cardiovascular:     Rate and Rhythm: Regular rhythm. Tachycardia present.  Pulmonary:     Effort: Respiratory distress present.     Breath sounds: No stridor. Wheezing and rhonchi present.  Abdominal:     General: There is no distension.  Skin:    General: Skin is warm.  Neurological:     Mental Status: She is alert and oriented to person, place, and time.     Cranial Nerves: No cranial nerve deficit.  Psychiatric:        Mood and Affect: Mood is anxious.      ED Results / Procedures / Treatments   Labs (all labs ordered are listed, but only abnormal results are displayed) Labs Reviewed  CBC WITH DIFFERENTIAL/PLATELET - Abnormal; Notable for the following components:      Result Value   RBC 5.22 (*)    Eosinophils Absolute 0.8 (*)  All other components within normal limits  SARS CORONAVIRUS 2 BY RT PCR (HOSPITAL ORDER, [facility])  COMPREHENSIVE METABOLIC PANEL  BRAIN NATRIURETIC PEPTIDE  URINALYSIS, ROUTINE W REFLEX MICROSCOPIC    EKG EKG Interpretation  Date/Time:  Thursday August 14 2020 11:27:48 EDT Ventricular Rate:  76 PR Interval:    QRS Duration: 90 QT Interval:  417 QTC Calculation: 469 R Axis:   -55 Text Interpretation: Sinus rhythm Left axis deviation Abnormal R-wave progression, early transition Abnormal ECG Confirmed by Carmin Muskrat  [phone]) on 08/14/2020 2:57:26 PM   Radiology DG Chest Port 1 View  Result Date: 08/14/2020 CLINICAL DATA:  Shortness of breath EXAM: PORTABLE CHEST 1 VIEW COMPARISON:  06/26/2020 FINDINGS: The heart size and mediastinal contours are within normal limits. Both lungs are clear. The visualized skeletal structures are unremarkable. IMPRESSION: No active disease. Electronically Signed   By: Davina Poke D.O.   On: 08/14/2020 11:26    Procedures Procedures (including critical care time)  Medications Ordered in ED Medications  albuterol (PROVENTIL,VENTOLIN) solution continuous neb (has no administration in time range)  magnesium sulfate IVPB 2 g 50 mL (2 g Intravenous New Bag/Given 08/14/20 1059)  methylPREDNISolone sodium succinate (SOLU-MEDROL) 125 mg/2 mL injection 125 mg (125 mg Intravenous Given 08/14/20 1100)    ED Course  I have reviewed the triage vital signs and the nursing notes.  Pertinent labs & imaging results that were available during my care of the patient were reviewed by me and considered in my medical decision making (see chart for details).     12:02 PM Patient now receiving continuous neb, no longer tripoding, still remains breathless, and capable of answering questions beyond brief responses.  2:55 PM Now following continuous neb, initial substantial supplemental oxygen, and secondary DuoNeb, provision of IV steroids, the patient has improved substantially, sitting upright, speaking much more clearly apparent she does have mild persistent wheezing, but on discussing her case with our respiratory therapist to his family with the patient, she notes that this is the patient's baseline. Patient no longer has an oxygen requirement, is awake and alert, breathing easily, other labs reviewed, generally reassuring, no evidence for coronavirus, no evidence for pneumonia. No evidence for atypical ACS with a nonischemic EKG. Patient discharged to follow-up closely with her  pulmonologist next week.  MDM Number of Diagnoses or Management Options COPD exacerbation ([place]): new, needed workup Respiratory distress: new, needed workup   Amount and/or Complexity of Data Reviewed Clinical lab tests: reviewed Tests in the radiology section of CPT: reviewed Tests in the medicine section of CPT: reviewed Decide to obtain previous medical records or to obtain history from someone other than the patient: yes Obtain history from someone other than the patient: yes Review and summarize past medical records: yes Discuss the patient with other providers: yes Independent visualization of images, tracings, or specimens: yes  Risk of Complications, Morbidity, and/or Mortality Presenting problems: high Diagnostic procedures: high Management options: high  Critical Care Total time providing critical care: 30-74 minutes (35)  Patient Progress Patient progress: improved  Final Clinical Impression(s) / ED Diagnoses Final diagnoses:  Respiratory distress  COPD exacerbation ([place])    Rx / DC Orders ED Discharge Orders         Ordered    predniSONE (DELTASONE) 20 MG tablet  Daily with breakfast        08/14/20 1455           Carmin Muskrat, MD 08/14/20 1459

## 2020-08-18 ENCOUNTER — Other Ambulatory Visit: Payer: Self-pay | Admitting: Internal Medicine

## 2020-08-20 ENCOUNTER — Other Ambulatory Visit: Payer: Self-pay | Admitting: Internal Medicine

## 2020-08-21 ENCOUNTER — Telehealth: Payer: Self-pay | Admitting: *Deleted

## 2020-08-21 NOTE — Telephone Encounter (Addendum)
Information sent through CoverMyMeds for PA for Lidocaine 5% Patches.  Awaiting determination within 72 hours.  Sander Nephew, RN 08/21/2020 9:26 AM. Information was resent through CoverMyMeds.  Awaiting determination within 72 hours.   Sander Nephew, RN 08/28/2020 11:53 AM.  Call from patient requesting Lidocaine Patches.  Call to patient's insurance company PA was denied as patient will need to try and fail or contraindication for not taking Duloxetine, Lyrica or Gabapentin.  Message to be sent to the Yellow Team to make changes if appropriate.  Sander Nephew, RN 09/17/2020 10:46 AM.

## 2020-08-26 ENCOUNTER — Telehealth (HOSPITAL_COMMUNITY): Payer: Self-pay

## 2020-08-26 NOTE — Telephone Encounter (Signed)
Per pulmonary rehab staff, closed pulmonary rehab referral.

## 2020-08-27 ENCOUNTER — Other Ambulatory Visit: Payer: Self-pay

## 2020-08-27 ENCOUNTER — Ambulatory Visit (INDEPENDENT_AMBULATORY_CARE_PROVIDER_SITE_OTHER): Payer: Medicaid Other | Admitting: Internal Medicine

## 2020-08-27 ENCOUNTER — Encounter: Payer: Self-pay | Admitting: Internal Medicine

## 2020-08-27 ENCOUNTER — Telehealth (HOSPITAL_COMMUNITY): Payer: Self-pay

## 2020-08-27 VITALS — BP 114/96 | HR 74 | Temp 98.3°F | Ht 63.5 in | Wt 156.9 lb

## 2020-08-27 DIAGNOSIS — Z91030 Bee allergy status: Secondary | ICD-10-CM

## 2020-08-27 DIAGNOSIS — J441 Chronic obstructive pulmonary disease with (acute) exacerbation: Secondary | ICD-10-CM | POA: Diagnosis present

## 2020-08-27 DIAGNOSIS — J4489 Other specified chronic obstructive pulmonary disease: Secondary | ICD-10-CM

## 2020-08-27 DIAGNOSIS — J449 Chronic obstructive pulmonary disease, unspecified: Secondary | ICD-10-CM

## 2020-08-27 DIAGNOSIS — Z720 Tobacco use: Secondary | ICD-10-CM | POA: Diagnosis not present

## 2020-08-27 DIAGNOSIS — M533 Sacrococcygeal disorders, not elsewhere classified: Secondary | ICD-10-CM

## 2020-08-27 MED ORDER — DULERA 200-5 MCG/ACT IN AERO: 2.0000 | INHALATION_SPRAY | Freq: Two times a day (BID) | RESPIRATORY_TRACT | 2 refills | Status: DC

## 2020-08-27 MED ORDER — AZITHROMYCIN 250 MG PO TABS: ORAL_TABLET | ORAL | 0 refills | Status: AC

## 2020-08-27 MED ORDER — EPINEPHRINE 0.3 MG/0.3ML IJ SOAJ: 0.3000 mg | INTRAMUSCULAR | 3 refills | Status: DC | PRN

## 2020-08-27 MED ORDER — ALBUTEROL SULFATE (2.5 MG/3ML) 0.083% IN NEBU: 2.5000 mg | INHALATION_SOLUTION | Freq: Four times a day (QID) | RESPIRATORY_TRACT | 12 refills | Status: DC | PRN

## 2020-08-27 MED ORDER — PROAIR HFA 108 (90 BASE) MCG/ACT IN AERS
INHALATION_SPRAY | RESPIRATORY_TRACT | 0 refills | Status: DC
Start: 2020-08-27 — End: 2020-10-20

## 2020-08-27 MED ORDER — PREDNISONE 20 MG PO TABS: 40.0000 mg | ORAL_TABLET | Freq: Every day | ORAL | 0 refills | Status: AC

## 2020-08-27 MED ORDER — NICOTINE 7 MG/24HR TD PT24: 7.0000 mg | MEDICATED_PATCH | TRANSDERMAL | 3 refills | Status: AC

## 2020-08-27 MED ORDER — LIDOCAINE 5 % EX PTCH: 1.0000 | MEDICATED_PATCH | CUTANEOUS | 1 refills | Status: DC

## 2020-08-27 NOTE — Assessment & Plan Note (Signed)
Mild COPD exacerbation: She states that for the past week she has been experiencing cough productive of green/yellow sputum that is typically worse in the morning.  She denies any shortness of breath.  On physical exams, she did have wheezes on lung auscultation.  Plan: -Prednisone 40 mg x 5 days, Z-Pak

## 2020-08-27 NOTE — Progress Notes (Signed)
   CC: Hospital follow-up, COPD/asthma  HPI:  Kathleen [name] is a 59 y.o. with medical history significant for COPD, asthma and bee venom allergy presenting after recent ED visit.  Please see problem based charting for further details.  Past Medical History:  Diagnosis Date  . Anxiety   . Arthritis    hands  . Asthma    2 sets of PFT's in 04/09 without sign variability. Last set with significant decrease in FEV1 with saline alone, suggesting Asthma but  recommended clinical corelation   . Asthma   . Bipolar 1 disorder [facility])    therapist is Caryl Asp and is followed by [employer]  . Blackout    negative work up including ESR, ANA, opthalmology referral, carotid dopplers, 2D echo, MRI and EEG.  . BREAST LUMP 03/25/2008   Annotation: bilaterally Qualifier: Diagnosis of  By: Tomasa Hosteller MD, Edmon Crape.   . 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 Bronchitis   . Chronic headache   . Chronic low back pain 08/08/2008   Qualifier: Diagnosis of  By: Redmond Pulling  MD, Mateo Flow    . Chronic pain    normal work up including TSH, RPR, B12, HIV, plain films, 2 ESR's, ANA, CK, RF along with routine CBC, CMET and UA. Further work up includes CRP, ESR, SPEP/UPEP, hepatitis erology, A1C , repeat ANA  . COPD (chronic obstructive pulmonary disease) ([place])   . COPD exacerbation ([place]) 03/31/2019  . Depression   . DUB (dysfunctional uterine bleeding)    and pelvic pain, with negative endometrial bx in 07/09 and transvaginal U/S significant for mild fibroids in 0/09.  . Emphysema of lung ([place])   . GERD (gastroesophageal reflux disease)   . Hallucinations 03/18/2008   Qualifier: Diagnosis of  By: Redmond Pulling  MD, Mateo Flow    . Hyperlipidemia   . Lower extremity edema    Neg ABI's, normal 2D echo, normal albumin  . Menorrhagia   . Ovarian cyst   . Polysubstance abuse ([place])    none since March 17,2009.  Marland Kitchen Sleep apnea    NO CPAP  . Stroke [facility])    heat stroke  07/10/19  . 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 Tubular adenoma of colon    Review of Systems:  As per HPI  Physical Exam:  Vitals:   08/27/20 1122  BP: (!) 114/96  Pulse: 74  Temp: 98.3 F (36.8 C)  TempSrc: Oral  SpO2: 99%  Weight: 156 lb 14.4 oz (71.2 kg)  Height: 5' 3.5" (1.613 m)   Physical Exam Vitals and nursing note reviewed.  Cardiovascular:     Rate and Rhythm: Normal rate.     Heart sounds: No murmur heard.   Pulmonary:     Effort: Pulmonary effort is normal. No respiratory distress.     Breath sounds: No stridor. Wheezing present. No rales.  Neurological:     Mental Status: She is alert.  Psychiatric:        Mood and Affect: Mood normal.        Behavior: Behavior normal.     Assessment & Plan:   See Encounters Tab for problem based charting.  Patient discussed with Dr. Rebeca Alert

## 2020-08-27 NOTE — Patient Instructions (Addendum)
Ms. Hagadorn,  It was a pleasure taking care of you here in the clinic today.  I am glad you are doing better from the anaphylactic reaction.  I will refill other medication she requested.  You also have a mild COPD exacerbation with your cough and the wheezing.  I will prescribe you prednisone 40 mg for 5 days and azithromycin.  Take care! Dr. Eileen Stanford  Please call the internal medicine center clinic if you have any questions or concerns, we may be able to help and keep you from a long and expensive emergency room wait. Our clinic and after hours phone number is [phone], the best time to call is Monday through Friday 9 am to 4 pm but there is always someone available 24/7 if you have an emergency. If you need medication refills please notify your pharmacy one week in advance and they will send [place] a request.

## 2020-08-27 NOTE — Assessment & Plan Note (Signed)
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  She was in her garden when she realized that she was bitten by a bee on her right foot which subsequently swelled up.  She initially injected herself with her EpiPen and called 911.  In the emergency department she received supplemental oxygen, albuterol, DuoNeb, magnesium and IV methylprednisolone.  CBC showed eosinophilia.  She was subsequently discharged on 4 days of prednisone.  Since then, she has been doing well and denies shortness of breath.  She still continues to work in her garden.  Plan: -Given refills for EpiPen

## 2020-08-27 NOTE — Telephone Encounter (Signed)
Pt called back stating she wants to schedule for pulmonary rehab. Pt stated that she was sick and had to go to ER but she is okay now to schedule for pulmonary rehab. Opened pt pulmonary rehab referral back up and passed pt ppw to the PR staff for scheduling.

## 2020-08-27 NOTE — Progress Notes (Signed)
Internal Medicine Clinic Attending  Case discussed with Dr. Agyei at the time of the visit.  We reviewed the resident's history and exam and pertinent patient test results.  I agree with the assessment, diagnosis, and plan of care documented in the resident's note.  Kendrik Mcshan, M.D., Ph.D.  

## 2020-09-05 ENCOUNTER — Encounter (HOSPITAL_COMMUNITY): Payer: Self-pay

## 2020-09-05 ENCOUNTER — Telehealth (HOSPITAL_COMMUNITY): Payer: Self-pay

## 2020-09-05 NOTE — Telephone Encounter (Signed)
Attempted to call pt in regards to Pulmonary Rehab LMTCB, Mailed letter

## 2020-09-17 ENCOUNTER — Telehealth: Payer: Self-pay | Admitting: Student

## 2020-09-17 ENCOUNTER — Other Ambulatory Visit: Payer: Self-pay | Admitting: Student

## 2020-09-17 NOTE — Telephone Encounter (Signed)
Received a note that patient was unable to pick up 5% Lidocaine patches through her pharmacy due to coverage issues. Left a voicemail for patient that she may pick up over the counter 1% lidocaine patches if needed and to call back [facility] with any questions or concerns.  Jeralyn Bennett, PGY1 Internal Medicine [phone]

## 2020-09-18 ENCOUNTER — Telehealth: Payer: Self-pay | Admitting: *Deleted

## 2020-09-18 ENCOUNTER — Telehealth (HOSPITAL_COMMUNITY): Payer: Self-pay

## 2020-09-18 NOTE — Telephone Encounter (Signed)
Pt returned Pr phone call and stated she is interested in PR. Patient will come in for orientation on 11/24/20 @ 930AM and will attend the 11AM exercise class.  Mailed letter

## 2020-09-18 NOTE — Telephone Encounter (Signed)
Pt stated it is difficulty for her to wear a mask while exercise. Adv pt she would have to wear a mask at all times per [facility]. She stated "well when I get there you all will see".

## 2020-10-18 ENCOUNTER — Other Ambulatory Visit: Payer: Self-pay | Admitting: Internal Medicine

## 2020-10-20 ENCOUNTER — Telehealth: Payer: Self-pay | Admitting: *Deleted

## 2020-10-20 NOTE — Telephone Encounter (Signed)
Call from [place] - states Kathleen [name] is on backorder. Do you want to change to a different medication, if so, send new rx? Thanks

## 2020-10-22 ENCOUNTER — Other Ambulatory Visit: Payer: Self-pay | Admitting: Internal Medicine

## 2020-10-22 MED ORDER — BUDESONIDE-FORMOTEROL FUMARATE 160-4.5 MCG/ACT IN AERO: 2.0000 | INHALATION_SPRAY | Freq: Two times a day (BID) | RESPIRATORY_TRACT | 3 refills | Status: DC

## 2020-10-22 NOTE — Telephone Encounter (Signed)
Hello. [name] has been swapped for Symbicort. Please let patient know. Thank you!

## 2020-10-22 NOTE — Telephone Encounter (Signed)
I called pt about new rx for Symbicort- no answer; left message to call the office.

## 2020-10-23 NOTE — Telephone Encounter (Signed)
Called pt again - goes straight to vm.

## 2020-10-28 NOTE — Telephone Encounter (Signed)
Information was sent through CoverMyMeds for PA for Lidocaine Patches.  Approved 10/24/2020 through 10/24/2021. [id number]. [facility] was called at [phone] and notified of.  Sander Nephew, RN 10/28/2020 11:35 AM.

## 2020-11-20 ENCOUNTER — Telehealth (HOSPITAL_COMMUNITY): Payer: Self-pay | Admitting: *Deleted

## 2020-11-24 ENCOUNTER — Ambulatory Visit (HOSPITAL_COMMUNITY): Payer: Medicaid Other

## 2020-12-02 ENCOUNTER — Ambulatory Visit (HOSPITAL_COMMUNITY): Payer: Medicaid Other

## 2020-12-04 ENCOUNTER — Ambulatory Visit (HOSPITAL_COMMUNITY): Payer: Medicaid Other

## 2020-12-09 ENCOUNTER — Ambulatory Visit (HOSPITAL_COMMUNITY): Payer: Medicaid Other

## 2020-12-11 ENCOUNTER — Ambulatory Visit (HOSPITAL_COMMUNITY): Payer: Medicaid Other

## 2020-12-16 ENCOUNTER — Ambulatory Visit (HOSPITAL_COMMUNITY): Payer: Medicaid Other

## 2020-12-18 ENCOUNTER — Ambulatory Visit (HOSPITAL_COMMUNITY): Payer: Medicaid Other

## 2020-12-23 ENCOUNTER — Ambulatory Visit (HOSPITAL_COMMUNITY): Payer: Medicaid Other

## 2020-12-24 ENCOUNTER — Emergency Department (HOSPITAL_COMMUNITY): Payer: Medicaid Other

## 2020-12-24 ENCOUNTER — Encounter (HOSPITAL_COMMUNITY): Payer: Self-pay

## 2020-12-24 ENCOUNTER — Other Ambulatory Visit: Payer: Self-pay

## 2020-12-24 ENCOUNTER — Emergency Department (HOSPITAL_COMMUNITY)
Admission: EM | Admit: 2020-12-24 | Discharge: 2020-12-24 | Disposition: A | Discharge status: Home / Self Care | Payer: Medicaid Other | Attending: Emergency Medicine | Admitting: Emergency Medicine

## 2020-12-24 DIAGNOSIS — Z20822 Contact with and (suspected) exposure to covid-19: Secondary | ICD-10-CM | POA: Diagnosis not present

## 2020-12-24 DIAGNOSIS — J45909 Unspecified asthma, uncomplicated: Secondary | ICD-10-CM | POA: Diagnosis not present

## 2020-12-24 DIAGNOSIS — Z7951 Long term (current) use of inhaled steroids: Secondary | ICD-10-CM | POA: Insufficient documentation

## 2020-12-24 DIAGNOSIS — J441 Chronic obstructive pulmonary disease with (acute) exacerbation: Secondary | ICD-10-CM | POA: Diagnosis not present

## 2020-12-24 DIAGNOSIS — Z7982 Long term (current) use of aspirin: Secondary | ICD-10-CM | POA: Insufficient documentation

## 2020-12-24 DIAGNOSIS — Z87891 Personal history of nicotine dependence: Secondary | ICD-10-CM | POA: Insufficient documentation

## 2020-12-24 DIAGNOSIS — R0602 Shortness of breath: Secondary | ICD-10-CM | POA: Diagnosis present

## 2020-12-24 LAB — COMPREHENSIVE METABOLIC PANEL
ALT: 25 U/L (ref 0–44)
AST: 24 U/L (ref 15–41)
Albumin: 3.7 g/dL (ref 3.5–5.0)
Alkaline Phosphatase: 68 U/L (ref 38–126)
Anion gap: 10 (ref 5–15)
BUN: 10 mg/dL (ref 6–20)
CO2: 26 mmol/L (ref 22–32)
Calcium: 8.8 mg/dL — ABNORMAL LOW (ref 8.9–10.3)
Chloride: 107 mmol/L (ref 98–111)
Creatinine, Ser: 1.01 mg/dL — ABNORMAL HIGH (ref 0.44–1.00)
GFR, Estimated: >60 mL/min (ref >60)
Glucose, Bld: 77 mg/dL (ref 70–99)
Potassium: 3.7 mmol/L (ref 3.5–5.1)
Sodium: 143 mmol/L (ref 135–145)
Total Bilirubin: 0.6 mg/dL (ref 0.3–1.2)
Total Protein: 6.5 g/dL (ref 6.5–8.1)

## 2020-12-24 LAB — CBC WITH DIFFERENTIAL/PLATELET
Abs Immature Granulocytes: 0.02 10*3/uL (ref 0.00–0.07)
Basophils Absolute: 0 10*3/uL (ref 0.0–0.1)
Basophils Relative: 1 %
Eosinophils Absolute: 0.4 10*3/uL (ref 0.0–0.5)
Eosinophils Relative: 5 %
HCT: 42 % (ref 36.0–46.0)
Hemoglobin: 13.5 g/dL (ref 12.0–15.0)
Immature Granulocytes: 0 %
Lymphocytes Relative: 35 %
Lymphs Abs: 2.7 10*3/uL (ref 0.7–4.0)
MCH: 28.2 pg (ref 26.0–34.0)
MCHC: 32.1 g/dL (ref 30.0–36.0)
MCV: 87.9 fL (ref 80.0–100.0)
Monocytes Absolute: 0.6 10*3/uL (ref 0.1–1.0)
Monocytes Relative: 8 %
Neutro Abs: 3.9 10*3/uL (ref 1.7–7.7)
Neutrophils Relative %: 51 %
Platelets: 320 10*3/uL (ref 150–400)
RBC: 4.78 MIL/uL (ref 3.87–5.11)
RDW: 13.7 % (ref 11.5–15.5)
WBC: 7.7 10*3/uL (ref 4.0–10.5)
nRBC: 0 % (ref 0.0–0.2)

## 2020-12-24 LAB — RESP PANEL BY RT-PCR (FLU A&B, COVID) ARPGX2
Influenza A by PCR: NEGATIVE
Influenza B by PCR: NEGATIVE
SARS Coronavirus 2 by RT PCR: NEGATIVE

## 2020-12-24 MED ORDER — METHYLPREDNISOLONE SODIUM SUCC 125 MG IJ SOLR
125.0000 mg | Freq: Once | INTRAMUSCULAR | Status: AC
  Administered 2020-12-24: 125 mg via INTRAVENOUS
  Filled 2020-12-24: qty 2

## 2020-12-24 MED ORDER — BUDESONIDE-FORMOTEROL FUMARATE 160-4.5 MCG/ACT IN AERO: 2.0000 | INHALATION_SPRAY | Freq: Once | RESPIRATORY_TRACT | 12 refills | Status: DC

## 2020-12-24 MED ORDER — ALBUTEROL SULFATE HFA 108 (90 BASE) MCG/ACT IN AERS: 1.0000 | INHALATION_SPRAY | Freq: Four times a day (QID) | RESPIRATORY_TRACT | 0 refills | Status: DC | PRN

## 2020-12-24 MED ORDER — ONDANSETRON HCL 4 MG/2ML IJ SOLN
4.0000 mg | Freq: Once | INTRAMUSCULAR | Status: AC
  Administered 2020-12-24: 4 mg via INTRAVENOUS
  Filled 2020-12-24: qty 2

## 2020-12-24 MED ORDER — PREDNISONE 10 MG PO TABS: 20.0000 mg | ORAL_TABLET | Freq: Every day | ORAL | 0 refills | Status: DC

## 2020-12-24 MED ORDER — ALBUTEROL SULFATE HFA 108 (90 BASE) MCG/ACT IN AERS
2.0000 | INHALATION_SPRAY | Freq: Once | RESPIRATORY_TRACT | Status: AC
  Administered 2020-12-24: 2 via RESPIRATORY_TRACT
  Filled 2020-12-24: qty 6.7

## 2020-12-24 MED ORDER — ALBUTEROL SULFATE (2.5 MG/3ML) 0.083% IN NEBU
2.5000 mg | INHALATION_SOLUTION | Freq: Once | RESPIRATORY_TRACT | Status: AC
  Administered 2020-12-24: 2.5 mg via RESPIRATORY_TRACT
  Filled 2020-12-24: qty 3

## 2020-12-24 MED ORDER — HYDROMORPHONE HCL 1 MG/ML IJ SOLN
0.5000 mg | Freq: Once | INTRAMUSCULAR | Status: AC
  Administered 2020-12-24: 0.5 mg via INTRAVENOUS
  Filled 2020-12-24: qty 1

## 2020-12-24 MED ORDER — IPRATROPIUM-ALBUTEROL 0.5-2.5 (3) MG/3ML IN SOLN
3.0000 mL | Freq: Once | RESPIRATORY_TRACT | Status: AC
  Administered 2020-12-24: 3 mL via RESPIRATORY_TRACT
  Filled 2020-12-24: qty 3

## 2020-12-24 MED ORDER — SODIUM CHLORIDE 0.9 % IV BOLUS
1000.0000 mL | Freq: Once | INTRAVENOUS | Status: AC
  Administered 2020-12-24: 1000 mL via INTRAVENOUS

## 2020-12-24 NOTE — ED Provider Notes (Signed)
[facility] DEPT Provider Note   CSN: [id number] Arrival date & time: 12/24/20  1246     History Chief Complaint  Patient presents with  . Shortness of Breath    Kathleen [name] is a 60 y.o. female.  Patient complains of shortness of breath.  Patient was exposed to [place].  She has a history of COPD. No fever no chills  The history is provided by the patient. No language interpreter was used.  Shortness of Breath Severity:  Moderate Onset quality:  Sudden Timing:  Constant Progression:  Waxing and waning Chronicity:  New Context: activity   Relieved by:  Nothing Worsened by:  Nothing Ineffective treatments:  None tried Associated symptoms: no abdominal pain, no chest pain, no cough, no headaches and no rash   Risk factors: no recent alcohol use        Past Medical History:  Diagnosis Date  . Anxiety   . Arthritis    hands  . Asthma    2 sets of PFT's in 04/09 without sign variability. Last set with significant decrease in FEV1 with saline alone, suggesting Asthma but  recommended clinical corelation   . Asthma   . Bipolar 1 disorder [facility])    therapist is Caryl Asp and is followed by [employer]  . Blackout    negative work up including ESR, ANA, opthalmology referral, carotid dopplers, 2D echo, MRI and EEG.  . BREAST LUMP 03/25/2008   Annotation: bilaterally Qualifier: Diagnosis of  By: Tomasa Hosteller MD, Edmon Crape.   . 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 Bronchitis   . Chronic headache   . Chronic low back pain 08/08/2008   Qualifier: Diagnosis of  By: Redmond Pulling  MD, Mateo Flow    . Chronic pain    normal work up including TSH, RPR, B12, HIV, plain films, 2 ESR's, ANA, CK, RF along with routine CBC, CMET and UA. Further work up includes CRP, ESR, SPEP/UPEP, hepatitis erology, A1C , repeat ANA  . COPD (chronic obstructive pulmonary disease) ([place])   . COPD exacerbation ([place]) 03/31/2019  .  Depression   . DUB (dysfunctional uterine bleeding)    and pelvic pain, with negative endometrial bx in 07/09 and transvaginal U/S significant for mild fibroids in 0/09.  . Emphysema of lung ([place])   . GERD (gastroesophageal reflux disease)   . Hallucinations 03/18/2008   Qualifier: Diagnosis of  By: Redmond Pulling  MD, Mateo Flow    . Hyperlipidemia   . Lower extremity edema    Neg ABI's, normal 2D echo, normal albumin  . Menorrhagia   . Ovarian cyst   . Polysubstance abuse ([place])    none since March 17,2009.  Marland Kitchen Sleep apnea    NO CPAP  . 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 Tubular adenoma of colon     Patient Active Problem List   Diagnosis Date Noted  . 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 Daytime somnolence 04/06/2019  . Lumbar radiculopathy 07/26/2016  . Depression 09/19/2015  . Preventative health care 06/18/2014  . 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 GERD 01/21/2010  . Sacral back pain 11/27/2008  . Generalized anxiety disorder 03/14/2008  . Migraine variant 03/14/2008    Past Surgical History:  Procedure Laterality Date  . CESAREAN SECTION    . CESAREAN SECTION    . COLONOSCOPY    .  HERNIA REPAIR    . Left partial oophorectomy    . OOPHORECTOMY     1/2 ovary removed  . POLYPECTOMY    . VAGINA SURGERY     mesh     OB History    Gravida  3   Para  2   Term  2   Preterm  0   AB  1   Living  2     SAB  0   IAB  1   Ectopic  0   Multiple      Live Births  2           Family History  Problem Relation Age of Onset  . Colon cancer Mother 29  . Breast cancer Mother 35  . Rectal cancer Mother   . Diabetes Father   . Hypertension Father   . Kidney disease Father   . Colon cancer Father 102  . Prostate cancer Father   . Cancer Father   . Kidney disease Sister   . Cancer Brother   . Breast cancer Maternal Grandmother 103  . Esophageal cancer Neg Hx   . Stomach cancer Neg Hx     Social  History   Tobacco Use  . Smoking status: Former Smoker    Packs/day: 0.25    Years: 30.00    Pack years: 7.50    Types: Cigarettes    Quit date: 12/28/2016    Years since quitting: 3.9  . Smokeless tobacco: Never Used  . Tobacco comment: Occ. Wants Nicotine Patches   Vaping Use  . Vaping Use: Never used  Substance Use Topics  . Alcohol use: Not Currently    Alcohol/week: 0.0 standard drinks    Comment: rarely  . Drug use: Yes    Types: Marijuana    Comment: Sometimes.    Home Medications Prior to Admission medications   Medication Sig Start Date End Date Taking? Authorizing Provider  acetaminophen (TYLENOL) 325 MG tablet Take 2 tablets (650 mg total) by mouth every 6 (six) hours as needed for moderate pain. 03/20/20 03/20/21 Yes Maudie Mercury, MD  albuterol (VENTOLIN HFA) 108 (90 Base) MCG/ACT inhaler Inhale 1-2 puffs into the lungs every 6 (six) hours as needed for wheezing or shortness of breath. 12/24/20  Yes Milton Ferguson, MD  ASPIRIN ADULT LOW STRENGTH 81 MG EC tablet TAKE 1 Tablet BY MOUTH ONCE DAILY Patient taking differently: Take 81 mg by mouth daily. 12/03/19  Yes Helberg, Larkin Ina, MD  benzonatate (TESSALON) 100 MG capsule Take 1 capsule (100 mg total) by mouth every 8 (eight) hours. 06/22/20  Yes Virgel Manifold, MD  budesonide-formoterol [facility]) 160-4.5 MCG/ACT inhaler Inhale 2 puffs into the lungs once for 1 dose. 12/24/20 12/24/20 Yes Milton Ferguson, MD  DULERA 200-5 MCG/ACT AERO Inhale 2 puffs into the lungs every 6 (six) hours. 10/31/20  Yes [provider]  EPINEPHrine 0.3 mg/0.3 mL IJ SOAJ injection Inject 0.3 mg into the muscle as needed for anaphylaxis. Patient taking differently: Inject 0.3 mg into the muscle daily as needed for anaphylaxis. 08/27/20  Yes Agyei, Caprice Kluver, MD  lidocaine (LIDODERM) 5 % Place 1 patch onto the skin daily. Remove & Discard patch within 12 hours or as directed by MD 08/27/20  Yes Agyei, Caprice Kluver, MD  montelukast (SINGULAIR) 10 MG  tablet Take 1 tablet (10 mg total) by mouth at bedtime. 06/23/20  Yes Mosetta Anis, MD  pantoprazole (PROTONIX) 40 MG tablet Take 1 tablet (40 mg total) by  mouth daily. 06/23/20  Yes Mosetta Anis, MD  predniSONE (DELTASONE) 10 MG tablet Take 2 tablets (20 mg total) by mouth daily. 12/24/20  Yes Milton Ferguson, MD  diclofenac Sodium (VOLTAREN) 1 % GEL Apply 2 g topically 4 (four) times daily. Patient not taking: No sig reported 03/21/20   Maudie Mercury, MD  loratadine (CLARITIN) 10 MG tablet Take 1 tablet (10 mg total) by mouth daily. Patient not taking: No sig reported 04/26/19   Forde Dandy, PharmD  nicotine (NICODERM CQ - DOSED IN MG/24 HR) 7 mg/24hr patch Place 1 patch (7 mg total) onto the skin daily. Patient not taking: No sig reported 08/27/20 12/25/20  Jean Rosenthal, MD  ondansetron (ZOFRAN) 4 MG tablet Take 1 tablet (4 mg total) by mouth daily as needed for nausea or vomiting. Patient not taking: No sig reported 06/23/20 06/23/21  Mosetta Anis, MD  sertraline (ZOLOFT) 100 MG tablet Take 1 tablet (100 mg total) by mouth daily. Patient not taking: No sig reported 06/23/20   Mosetta Anis, MD  tiotropium (SPIRIVA HANDIHALER) 18 MCG inhalation capsule Place 1 capsule (18 mcg total) into inhaler and inhale daily. Patient not taking: No sig reported 06/12/20   Mosetta Anis, MD  divalproex (DEPAKOTE ER) 500 MG 24 hr tablet Take 1 tablet (500 mg total) by mouth 2 (two) times daily. Patient not taking: Reported on 10/08/2019 08/09/19 03/23/20  [name], MD  fluticasone [facility]) 50 MCG/ACT nasal spray Place 1 spray into both nostrils daily. Patient not taking: Reported on 07/11/2019 04/26/19 03/23/20  Forde Dandy, PharmD  ipratropium-albuterol (DUONEB) 0.5-2.5 (3) MG/3ML SOLN Take 3 mLs by nebulization every 6 (six) hours as needed (wheezing or shortness of breath). Patient not taking: Reported on 03/19/2020 06/09/19 03/23/20  [name], MD  metoCLOPramide (REGLAN) 5 MG tablet Take  1 tablet (5 mg total) by mouth 3 (three) times daily before meals for 5 days. Patient not taking: Reported on 03/19/2020 07/17/19 03/23/20  [name], MD  SUMAtriptan (IMITREX) 25 MG tablet Take 1 tablet (25 mg total) by mouth every 2 (two) hours as needed for migraine or headache. Patient not taking: Reported on 10/08/2019 07/24/19 03/23/20  [name], DO    Allergies    Bee venom, Peach [prunus persica], Topiramate, and Tramadol  Review of Systems   Review of Systems  Constitutional: Negative for appetite change and fatigue.  HENT: Negative for congestion, ear discharge and sinus pressure.   Eyes: Negative for discharge.  Respiratory: Positive for shortness of breath. Negative for cough.   Cardiovascular: Negative for chest pain.  Gastrointestinal: Negative for abdominal pain and diarrhea.  Genitourinary: Negative for frequency and hematuria.  Musculoskeletal: Negative for back pain.  Skin: Negative for rash.  Neurological: Negative for seizures and headaches.  Psychiatric/Behavioral: Negative for hallucinations.    Physical Exam Updated Vital Signs BP (!) 126/104   Pulse 77   Temp 99.1 F (37.3 C) (Oral)   Resp 18   LMP 11/08/2014   SpO2 97%   Physical Exam Vitals reviewed.  Constitutional:      Appearance: She is well-developed.  HENT:     Head: Normocephalic.     Nose: Nose normal.  Eyes:     General: No scleral icterus.    Extraocular Movements: EOM normal.     Conjunctiva/sclera: Conjunctivae normal.  Neck:     Thyroid: No thyromegaly.  Cardiovascular:     Rate and Rhythm: Normal rate and regular  rhythm.     Heart sounds: No murmur heard. No friction rub. No gallop.   Pulmonary:     Breath sounds: No stridor. Wheezing present. No rales.  Chest:     Chest wall: No tenderness.  Abdominal:     General: There is no distension.     Tenderness: There is no abdominal tenderness. There is no rebound.  Musculoskeletal:        General: No edema.  Normal range of motion.     Cervical back: Neck supple.  Lymphadenopathy:     Cervical: No cervical adenopathy.  Skin:    Findings: No erythema or rash.  Neurological:     Mental Status: She is alert and oriented to person, place, and time.     Motor: No abnormal muscle tone.     Coordination: Coordination normal.  Psychiatric:        Mood and Affect: Mood and affect normal.        Behavior: Behavior normal.     ED Results / Procedures / Treatments   Labs (all labs ordered are listed, but only abnormal results are displayed) Labs Reviewed  COMPREHENSIVE METABOLIC PANEL - Abnormal; Notable for the following components:      Result Value   Creatinine, Ser 1.01 (*)    Calcium 8.8 (*)    All other components within normal limits  RESP PANEL BY RT-PCR (FLU A&B, COVID) ARPGX2  CBC WITH DIFFERENTIAL/PLATELET    EKG None  Radiology DG Chest Port 1 View  Result Date: 12/24/2020 CLINICAL DATA:  Shortness of breath. EXAM: PORTABLE CHEST 1 VIEW COMPARISON:  08/14/2020. FINDINGS: Mediastinum and hilar structures normal. Heart size normal. Lungs are clear. No pleural effusion or pneumothorax. Degenerative change thoracic spine. IMPRESSION: No acute cardiopulmonary disease. Electronically Signed   By: Marcello Moores  Register   On: 12/24/2020 14:23    Procedures Procedures (including critical care time)  Medications Ordered in ED Medications  albuterol (VENTOLIN HFA) 108 (90 Base) MCG/ACT inhaler 2 puff (2 puffs Inhalation Given 12/24/20 1340)  sodium chloride 0.9 % bolus 1,000 mL (0 mLs Intravenous Stopped 12/24/20 1522)  methylPREDNISolone sodium succinate (SOLU-MEDROL) 125 mg/2 mL injection 125 mg (125 mg Intravenous Given 12/24/20 1341)  HYDROmorphone (DILAUDID) injection 0.5 mg (0.5 mg Intravenous Given 12/24/20 1354)  ondansetron (ZOFRAN) injection 4 mg (4 mg Intravenous Given 12/24/20 1354)  ipratropium-albuterol (DUONEB) 0.5-2.5 (3) MG/3ML nebulizer solution 3 mL (3 mLs Nebulization Given  12/24/20 1544)  albuterol (PROVENTIL) (2.5 MG/3ML) 0.083% nebulizer solution 2.5 mg (2.5 mg Nebulization Given 12/24/20 1544)    ED Course  I have reviewed the triage vital signs and the nursing notes.  Pertinent labs & imaging results that were available during my care of the patient were reviewed by me and considered in my medical decision making (see chart for details).    MDM Rules/Calculators/A&P                          Patient with exacerbation of COPD and negative COVID test.  She will be discharged home with her Symbicort and albuterol and prednisone.  Patient improved with treatment Final Clinical Impression(s) / ED Diagnoses Final diagnoses:  COPD exacerbation ([place])    Rx / DC Orders ED Discharge Orders         Ordered    predniSONE (DELTASONE) 10 MG tablet  Daily        12/24/20 1639    budesonide-formoterol (SYMBICORT) 160-4.5 MCG/ACT inhaler  Once        12/24/20 1639    albuterol (VENTOLIN HFA) 108 (90 Base) MCG/ACT inhaler  Every 6 hours PRN        12/24/20 1639           Milton Ferguson, MD 12/27/20 [phone]

## 2020-12-24 NOTE — Addendum Note (Signed)
Addended by: Hulan Fray on: 12/24/2020 05:34 PM   Modules accepted: Orders

## 2020-12-24 NOTE — Discharge Instructions (Addendum)
Follow up with your doctor for recheck.  °

## 2020-12-24 NOTE — ED Triage Notes (Addendum)
Pt arrived via walk in, c/o worsening SOB. States exposure to COVID at home from son. Pt states hx of asthma, ran out of all prescriptions. Has not been tested for COVID yet. Also c/o fevers, chills, fatigue, diarrhea. Speaking in full sentences, but labored at this time.

## 2020-12-25 ENCOUNTER — Ambulatory Visit (HOSPITAL_COMMUNITY): Payer: Medicaid Other

## 2020-12-30 ENCOUNTER — Ambulatory Visit (HOSPITAL_COMMUNITY): Payer: Medicaid Other

## 2021-01-01 ENCOUNTER — Ambulatory Visit (HOSPITAL_COMMUNITY): Payer: Medicaid Other

## 2021-01-06 ENCOUNTER — Ambulatory Visit (HOSPITAL_COMMUNITY): Payer: Medicaid Other

## 2021-01-08 ENCOUNTER — Ambulatory Visit (HOSPITAL_COMMUNITY): Payer: Medicaid Other

## 2021-01-13 ENCOUNTER — Ambulatory Visit (HOSPITAL_COMMUNITY): Payer: Medicaid Other

## 2021-01-15 ENCOUNTER — Ambulatory Visit (HOSPITAL_COMMUNITY): Payer: Medicaid Other

## 2021-01-20 ENCOUNTER — Ambulatory Visit (HOSPITAL_COMMUNITY): Payer: Medicaid Other

## 2021-02-18 ENCOUNTER — Other Ambulatory Visit: Payer: Self-pay | Admitting: Internal Medicine

## 2021-02-18 DIAGNOSIS — J449 Chronic obstructive pulmonary disease, unspecified: Secondary | ICD-10-CM

## 2021-02-18 DIAGNOSIS — J4489 Other specified chronic obstructive pulmonary disease: Secondary | ICD-10-CM

## 2021-02-18 NOTE — Telephone Encounter (Signed)
Spoke with the patient.  Appt has been sch on 02/27/2021 with Dr. Collene Gobble.

## 2021-02-27 ENCOUNTER — Encounter: Payer: Medicaid Other | Admitting: Student

## 2021-03-12 ENCOUNTER — Other Ambulatory Visit: Payer: Self-pay | Admitting: Internal Medicine

## 2021-03-12 DIAGNOSIS — Z91030 Bee allergy status: Secondary | ICD-10-CM

## 2021-03-12 NOTE — Telephone Encounter (Signed)
Can you please refill this medication? Patient has medicaid. Thank you

## 2021-03-17 ENCOUNTER — Emergency Department (HOSPITAL_COMMUNITY)
Admission: EM | Admit: 2021-03-17 | Discharge: 2021-03-17 | Disposition: A | Discharge status: Home / Self Care | Payer: Medicaid Other | Attending: Emergency Medicine | Admitting: Emergency Medicine

## 2021-03-17 ENCOUNTER — Emergency Department (HOSPITAL_COMMUNITY): Payer: Medicaid Other

## 2021-03-17 ENCOUNTER — Other Ambulatory Visit: Payer: Self-pay

## 2021-03-17 ENCOUNTER — Encounter (HOSPITAL_COMMUNITY): Payer: Self-pay

## 2021-03-17 DIAGNOSIS — R112 Nausea with vomiting, unspecified: Secondary | ICD-10-CM

## 2021-03-17 DIAGNOSIS — Z87891 Personal history of nicotine dependence: Secondary | ICD-10-CM | POA: Diagnosis not present

## 2021-03-17 DIAGNOSIS — Z20822 Contact with and (suspected) exposure to covid-19: Secondary | ICD-10-CM | POA: Insufficient documentation

## 2021-03-17 DIAGNOSIS — J441 Chronic obstructive pulmonary disease with (acute) exacerbation: Secondary | ICD-10-CM | POA: Insufficient documentation

## 2021-03-17 DIAGNOSIS — M62830 Muscle spasm of back: Secondary | ICD-10-CM | POA: Diagnosis not present

## 2021-03-17 DIAGNOSIS — Z2839 Other underimmunization status: Secondary | ICD-10-CM | POA: Insufficient documentation

## 2021-03-17 DIAGNOSIS — J4521 Mild intermittent asthma with (acute) exacerbation: Secondary | ICD-10-CM | POA: Insufficient documentation

## 2021-03-17 DIAGNOSIS — R0602 Shortness of breath: Secondary | ICD-10-CM | POA: Diagnosis present

## 2021-03-17 LAB — CBC WITH DIFFERENTIAL/PLATELET
Abs Immature Granulocytes: 0.02 10*3/uL (ref 0.00–0.07)
Basophils Absolute: 0 10*3/uL (ref 0.0–0.1)
Basophils Relative: 0 %
Eosinophils Absolute: 0.2 10*3/uL (ref 0.0–0.5)
Eosinophils Relative: 2 %
HCT: 40 % (ref 36.0–46.0)
Hemoglobin: 13 g/dL (ref 12.0–15.0)
Immature Granulocytes: 0 %
Lymphocytes Relative: 22 %
Lymphs Abs: 2 10*3/uL (ref 0.7–4.0)
MCH: 28.4 pg (ref 26.0–34.0)
MCHC: 32.5 g/dL (ref 30.0–36.0)
MCV: 87.5 fL (ref 80.0–100.0)
Monocytes Absolute: 0.9 10*3/uL (ref 0.1–1.0)
Monocytes Relative: 10 %
Neutro Abs: 5.8 10*3/uL (ref 1.7–7.7)
Neutrophils Relative %: 66 %
Platelets: 290 10*3/uL (ref 150–400)
RBC: 4.57 MIL/uL (ref 3.87–5.11)
RDW: 13.2 % (ref 11.5–15.5)
WBC: 8.9 10*3/uL (ref 4.0–10.5)
nRBC: 0 % (ref 0.0–0.2)

## 2021-03-17 LAB — RESP PANEL BY RT-PCR (FLU A&B, COVID) ARPGX2
Influenza A by PCR: NEGATIVE
Influenza B by PCR: NEGATIVE
SARS Coronavirus 2 by RT PCR: NEGATIVE

## 2021-03-17 LAB — COMPREHENSIVE METABOLIC PANEL
ALT: 19 U/L (ref 0–44)
AST: 25 U/L (ref 15–41)
Albumin: 3.9 g/dL (ref 3.5–5.0)
Alkaline Phosphatase: 71 U/L (ref 38–126)
Anion gap: 7 (ref 5–15)
BUN: 21 mg/dL — ABNORMAL HIGH (ref 6–20)
CO2: 22 mmol/L (ref 22–32)
Calcium: 8.7 mg/dL — ABNORMAL LOW (ref 8.9–10.3)
Chloride: 110 mmol/L (ref 98–111)
Creatinine, Ser: 1.09 mg/dL — ABNORMAL HIGH (ref 0.44–1.00)
GFR, Estimated: 58 mL/min — ABNORMAL LOW (ref >60)
Glucose, Bld: 102 mg/dL — ABNORMAL HIGH (ref 70–99)
Potassium: 3.6 mmol/L (ref 3.5–5.1)
Sodium: 139 mmol/L (ref 135–145)
Total Bilirubin: 0.3 mg/dL (ref 0.3–1.2)
Total Protein: 7 g/dL (ref 6.5–8.1)

## 2021-03-17 LAB — TROPONIN I (HIGH SENSITIVITY): Troponin I (High Sensitivity): 3 ng/L (ref <18)

## 2021-03-17 MED ORDER — ONDANSETRON HCL 4 MG/2ML IJ SOLN
4.0000 mg | Freq: Once | INTRAMUSCULAR | Status: AC
  Administered 2021-03-17: 4 mg via INTRAVENOUS
  Filled 2021-03-17: qty 2

## 2021-03-17 MED ORDER — IPRATROPIUM-ALBUTEROL 0.5-2.5 (3) MG/3ML IN SOLN
3.0000 mL | Freq: Once | RESPIRATORY_TRACT | Status: AC
  Administered 2021-03-17: 3 mL via RESPIRATORY_TRACT
  Filled 2021-03-17: qty 3

## 2021-03-17 MED ORDER — METHYLPREDNISOLONE SODIUM SUCC 125 MG IJ SOLR
125.0000 mg | Freq: Once | INTRAMUSCULAR | Status: AC
  Administered 2021-03-17: 125 mg via INTRAVENOUS
  Filled 2021-03-17: qty 2

## 2021-03-17 MED ORDER — ALBUTEROL (5 MG/ML) CONTINUOUS INHALATION SOLN
10.0000 mg/h | INHALATION_SOLUTION | RESPIRATORY_TRACT | Status: DC
  Filled 2021-03-17: qty 20

## 2021-03-17 MED ORDER — PREDNISONE 10 MG (21) PO TBPK: ORAL_TABLET | ORAL | 0 refills | Status: DC

## 2021-03-17 MED ORDER — ONDANSETRON 4 MG PO TBDP: 4.0000 mg | ORAL_TABLET | Freq: Three times a day (TID) | ORAL | 0 refills | Status: DC | PRN

## 2021-03-17 MED ORDER — AEROCHAMBER Z-STAT PLUS/MEDIUM MISC: 1.0000 | Freq: Once | Status: DC

## 2021-03-17 MED ORDER — MORPHINE SULFATE (PF) 4 MG/ML IV SOLN
4.0000 mg | Freq: Once | INTRAVENOUS | Status: AC
Start: 2021-03-17 — End: 2021-03-17
  Administered 2021-03-17: 4 mg via INTRAVENOUS
  Filled 2021-03-17: qty 1

## 2021-03-17 MED ORDER — ALBUTEROL SULFATE HFA 108 (90 BASE) MCG/ACT IN AERS
6.0000 | INHALATION_SPRAY | Freq: Once | RESPIRATORY_TRACT | Status: AC
  Administered 2021-03-17: 6 via RESPIRATORY_TRACT
  Filled 2021-03-17: qty 6.7

## 2021-03-17 MED ORDER — SODIUM CHLORIDE 0.9 % IV BOLUS
1000.0000 mL | Freq: Once | INTRAVENOUS | Status: AC
Start: 2021-03-17 — End: 2021-03-17
  Administered 2021-03-17: 1000 mL via INTRAVENOUS

## 2021-03-17 MED ORDER — HYDROCODONE-ACETAMINOPHEN 5-325 MG PO TABS: 1.0000 | ORAL_TABLET | ORAL | 0 refills | Status: DC | PRN

## 2021-03-17 NOTE — ED Provider Notes (Signed)
[facility] DEPT Provider Note   CSN: [id number] Arrival date & time: 03/17/21  1607     History Chief Complaint  Patient presents with  . Asthma    Kathleen [name] is a 60 y.o. female.  Pt presents to the ED today with sob, n/v, back pain.  The pt has been sob for 2 hrs.  Pt said she ran out of her inhaler.  She also has nausea and a lot of back pain.  Pt denies any fevers or sick contacts.  She's had the covid vaccine, but not the booster.        Past Medical History:  Diagnosis Date  . Anxiety   . Arthritis    hands  . Asthma    2 sets of PFT's in 04/09 without sign variability. Last set with significant decrease in FEV1 with saline alone, suggesting Asthma but  recommended clinical corelation   . Asthma   . Bipolar 1 disorder [facility])    therapist is Caryl Asp and is followed by [employer]  . Blackout    negative work up including ESR, ANA, opthalmology referral, carotid dopplers, 2D echo, MRI and EEG.  . BREAST LUMP 03/25/2008   Annotation: bilaterally Qualifier: Diagnosis of  By: Tomasa Hosteller MD, Edmon Crape.   . 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 Bronchitis   . Chronic headache   . Chronic low back pain 08/08/2008   Qualifier: Diagnosis of  By: Redmond Pulling  MD, Mateo Flow    . Chronic pain    normal work up including TSH, RPR, B12, HIV, plain films, 2 ESR's, ANA, CK, RF along with routine CBC, CMET and UA. Further work up includes CRP, ESR, SPEP/UPEP, hepatitis erology, A1C , repeat ANA  . COPD (chronic obstructive pulmonary disease) ([place])   . COPD exacerbation ([place]) 03/31/2019  . Depression   . DUB (dysfunctional uterine bleeding)    and pelvic pain, with negative endometrial bx in 07/09 and transvaginal U/S significant for mild fibroids in 0/09.  . Emphysema of lung ([place])   . GERD (gastroesophageal reflux disease)   . Hallucinations 03/18/2008   Qualifier: Diagnosis of  By: Redmond Pulling  MD, Mateo Flow     . Hyperlipidemia   . Lower extremity edema    Neg ABI's, normal 2D echo, normal albumin  . Menorrhagia   . Ovarian cyst   . Polysubstance abuse ([place])    none since March 17,2009.  Marland Kitchen Sleep apnea    NO CPAP  . 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 Tubular adenoma of colon     Patient Active Problem List   Diagnosis Date Noted  . 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 Daytime somnolence 04/06/2019  . Lumbar radiculopathy 07/26/2016  . Depression 09/19/2015  . Preventative health care 06/18/2014  . 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 GERD 01/21/2010  . Sacral back pain 11/27/2008  . Generalized anxiety disorder 03/14/2008  . Migraine variant 03/14/2008    Past Surgical History:  Procedure Laterality Date  . CESAREAN SECTION    . CESAREAN SECTION    . COLONOSCOPY    . HERNIA REPAIR    . Left partial oophorectomy    . OOPHORECTOMY     1/2 ovary removed  . POLYPECTOMY    . VAGINA SURGERY     mesh     OB History  Gravida  3   Para  2   Term  2   Preterm  0   AB  1   Living  2     SAB  0   IAB  1   Ectopic  0   Multiple      Live Births  2           Family History  Problem Relation Age of Onset  . Colon cancer Mother 24  . Breast cancer Mother 32  . Rectal cancer Mother   . Diabetes Father   . Hypertension Father   . Kidney disease Father   . Colon cancer Father 64  . Prostate cancer Father   . Cancer Father   . Kidney disease Sister   . Cancer Brother   . Breast cancer Maternal Grandmother 103  . Esophageal cancer Neg Hx   . Stomach cancer Neg Hx     Social History   Tobacco Use  . Smoking status: Former Smoker    Packs/day: 0.25    Years: 30.00    Pack years: 7.50    Types: Cigarettes    Quit date: 12/28/2016    Years since quitting: 4.2  . Smokeless tobacco: Never Used  . Tobacco comment: Occ. Wants Nicotine Patches   Vaping Use  . Vaping Use: Never used  Substance  Use Topics  . Alcohol use: Yes    Alcohol/week: 0.0 standard drinks    Comment: rarely  . Drug use: Not Currently    Types: Marijuana    Comment: Sometimes.    Home Medications Prior to Admission medications   Medication Sig Start Date End Date Taking? Authorizing Provider  HYDROcodone-acetaminophen (NORCO/VICODIN) 5-325 MG tablet Take 1 tablet by mouth every 4 (four) hours as needed. 03/17/21  Yes [name], MD  ondansetron (ZOFRAN ODT) 4 MG disintegrating tablet Take 1 tablet (4 mg total) by mouth every 8 (eight) hours as needed for nausea or vomiting. 03/17/21  Yes [name], MD  predniSONE (STERAPRED UNI-PAK 21 TAB) 10 MG (21) TBPK tablet Take 6 tabs for 2 days, then 5 for 2 days, then 4 for 2 days, then 3 for 2 days, 2 for 2 days, then 1 for 2 days 03/17/21  Yes [name], MD  acetaminophen (TYLENOL) 325 MG tablet Take 2 tablets (650 mg total) by mouth every 6 (six) hours as needed for moderate pain. 03/20/20 03/20/21  Maudie Mercury, MD  ASPIRIN ADULT LOW STRENGTH 81 MG EC tablet TAKE 1 Tablet BY MOUTH ONCE DAILY Patient taking differently: Take 81 mg by mouth daily. 12/03/19   [name], MD  benzonatate (TESSALON) 100 MG capsule Take 1 capsule (100 mg total) by mouth every 8 (eight) hours. 06/22/20   Virgel Manifold, MD  budesonide-formoterol [facility]) 160-4.5 MCG/ACT inhaler Inhale 2 puffs into the lungs once for 1 dose. 12/24/20 12/24/20  Milton Ferguson, MD  diclofenac Sodium (VOLTAREN) 1 % GEL Apply 2 g topically 4 (four) times daily. Patient not taking: No sig reported 03/21/20   Maudie Mercury, MD  DULERA 200-5 MCG/ACT AERO Inhale 2 puffs into the lungs every 6 (six) hours. 10/31/20   [provider]  EPINEPHRINE 0.3 mg/0.3 mL IJ SOAJ injection INJECT CONTENTS OF 1 PEN AS NEEDED FOR ALLERGIC REACTION 03/12/21   Velna Ochs, MD  lidocaine (LIDODERM) 5 % Place 1 patch onto the skin daily. Remove & Discard patch within 12 hours or as directed by MD 08/27/20    Agyei, Caprice Kluver,  MD  loratadine (CLARITIN) 10 MG tablet Take 1 tablet (10 mg total) by mouth daily. Patient not taking: No sig reported 04/26/19   Forde Dandy, PharmD  montelukast (SINGULAIR) 10 MG tablet Take 1 tablet (10 mg total) by mouth at bedtime. 06/23/20   Mosetta Anis, MD  ondansetron (ZOFRAN) 4 MG tablet TAKE 1 TABLET BY MOUTH ONCE DAILY AS NEEDED FOR NAUSEA FOR VOMITING 02/18/21   [name], MD  pantoprazole (PROTONIX) 40 MG tablet Take 1 tablet (40 mg total) by mouth daily. 06/23/20   Mosetta Anis, MD  PROAIR HFA 108 [phone] Base) MCG/ACT inhaler INHALE 1 TO 2 PUFFS BY MOUTH EVERY 6 HOURS AS NEEDED FOR WHEEZING FOR SHORTNESS OF BREATH 02/18/21   [name], MD  sertraline (ZOLOFT) 100 MG tablet Take 1 tablet (100 mg total) by mouth daily. Patient not taking: No sig reported 06/23/20   Mosetta Anis, MD  tiotropium (SPIRIVA HANDIHALER) 18 MCG inhalation capsule Place 1 capsule (18 mcg total) into inhaler and inhale daily. Patient not taking: No sig reported 06/12/20   Mosetta Anis, MD  divalproex (DEPAKOTE ER) 500 MG 24 hr tablet Take 1 tablet (500 mg total) by mouth 2 (two) times daily. Patient not taking: Reported on 10/08/2019 08/09/19 03/23/20  [name], MD  fluticasone [facility]) 50 MCG/ACT nasal spray Place 1 spray into both nostrils daily. Patient not taking: Reported on 07/11/2019 04/26/19 03/23/20  Forde Dandy, PharmD  ipratropium-albuterol (DUONEB) 0.5-2.5 (3) MG/3ML SOLN Take 3 mLs by nebulization every 6 (six) hours as needed (wheezing or shortness of breath). Patient not taking: Reported on 03/19/2020 06/09/19 03/23/20  [name], MD  metoCLOPramide (REGLAN) 5 MG tablet Take 1 tablet (5 mg total) by mouth 3 (three) times daily before meals for 5 days. Patient not taking: Reported on 03/19/2020 07/17/19 03/23/20  [name], MD  SUMAtriptan (IMITREX) 25 MG tablet Take 1 tablet (25 mg total) by mouth every 2 (two) hours as needed for migraine or  headache. Patient not taking: Reported on 10/08/2019 07/24/19 03/23/20  [name], DO    Allergies    Bee venom, Peach [prunus persica], Topiramate, and Tramadol  Review of Systems   Review of Systems  Respiratory: Positive for cough, shortness of breath and wheezing.   Gastrointestinal: Positive for nausea.  Musculoskeletal: Positive for back pain.  All other systems reviewed and are negative.   Physical Exam Updated Vital Signs BP 106/65   Pulse 93   Temp 98.2 F (36.8 C) (Oral)   Resp (!) 33   Ht _0  (1.575 m)   Wt 68.9 kg   LMP 11/08/2014   SpO2 98%   BMI 27.80 kg/m   Physical Exam Vitals and nursing note reviewed.  Constitutional:      General: She is in acute distress.  HENT:     Head: Normocephalic and atraumatic.     Right Ear: External ear normal.     Left Ear: External ear normal.     Nose: Nose normal.     Mouth/Throat:     Mouth: Mucous membranes are dry.  Eyes:     Extraocular Movements: Extraocular movements intact.     Conjunctiva/sclera: Conjunctivae normal.     Pupils: Pupils are equal, round, and reactive to light.  Cardiovascular:     Rate and Rhythm: Regular rhythm. Tachycardia present.     Pulses: Normal pulses.     Heart sounds: Normal heart sounds.  Pulmonary:  Effort: Respiratory distress present.     Breath sounds: Wheezing present.  Abdominal:     General: Abdomen is flat. Bowel sounds are normal.     Palpations: Abdomen is soft.  Musculoskeletal:        General: Normal range of motion.     Cervical back: Normal range of motion and neck supple.  Skin:    General: Skin is warm.     Capillary Refill: Capillary refill takes less than 2 seconds.  Neurological:     General: No focal deficit present.     Mental Status: She is alert and oriented to person, place, and time.  Psychiatric:        Mood and Affect: Mood normal.        Behavior: Behavior normal.        Thought Content: Thought content normal.         Judgment: Judgment normal.     ED Results / Procedures / Treatments   Labs (all labs ordered are listed, but only abnormal results are displayed) Labs Reviewed  COMPREHENSIVE METABOLIC PANEL - Abnormal; Notable for the following components:      Result Value   Glucose, Bld 102 (*)    BUN 21 (*)    Creatinine, Ser 1.09 (*)    Calcium 8.7 (*)    GFR, Estimated 58 (*)    All other components within normal limits  RESP PANEL BY RT-PCR (FLU A&B, COVID) ARPGX2  CBC WITH DIFFERENTIAL/PLATELET  TROPONIN I (HIGH SENSITIVITY)  TROPONIN I (HIGH SENSITIVITY)    EKG EKG Interpretation  Date/Time:  Tuesday March 17 2021 16:41:08 EDT Ventricular Rate:  97 PR Interval:  144 QRS Duration: 88 QT Interval:  380 QTC Calculation: 483 R Axis:   -64 Text Interpretation: Sinus rhythm Left anterior fascicular block Abnormal R-wave progression, early transition No significant change since last tracing Confirmed by [name] [phone]) on 03/17/2021 5:27:32 PM   Radiology DG Chest Port 1 View  Result Date: 03/17/2021 CLINICAL DATA:  60 year old female with shortness of breath. EXAM: PORTABLE CHEST 1 VIEW COMPARISON:  Chest radiograph dated 12/24/2020. FINDINGS: The heart size and mediastinal contours are within normal limits. Both lungs are clear. The visualized skeletal structures are unremarkable. IMPRESSION: No active disease. Electronically Signed   By: Anner Crete M.D.   On: 03/17/2021 16:54    Procedures Procedures   Medications Ordered in ED Medications  aerochamber Z-Stat Plus/medium 1 each (1 each Other Not Given 03/17/21 1652)  albuterol (PROVENTIL,VENTOLIN) solution continuous neb (10 mg/hr Nebulization Not Given 03/17/21 1838)  albuterol (VENTOLIN HFA) 108 (90 Base) MCG/ACT inhaler 6 puff (6 puffs Inhalation Given 03/17/21 1620)  sodium chloride 0.9 % bolus 1,000 mL (0 mLs Intravenous Stopped 03/17/21 1751)  morphine 4 MG/ML injection 4 mg (4 mg Intravenous Given 03/17/21 1705)   ondansetron (ZOFRAN) injection 4 mg (4 mg Intravenous Given 03/17/21 1706)  methylPREDNISolone sodium succinate (SOLU-MEDROL) 125 mg/2 mL injection 125 mg (125 mg Intravenous Given 03/17/21 1706)  ipratropium-albuterol (DUONEB) 0.5-2.5 (3) MG/3ML nebulizer solution 3 mL (3 mLs Nebulization Given 03/17/21 1705)    ED Course  I have reviewed the triage vital signs and the nursing notes.  Pertinent labs & imaging results that were available during my care of the patient were reviewed by me and considered in my medical decision making (see chart for details).    MDM Rules/Calculators/A&P  Pt is Covid negative.  She is given an inhaler and given a duoneb.  She is given IV solumedrol, zofran, and morphine.  She is still wheezing, but said she needs to go home.  She refused the continuous neb from respiratory.  She asked the nurse to take out her IV.  She said she needs to pick up her daughter.  She is told to return if worse and to f/u with her pcp.  Kathleen [name] was evaluated in Emergency Department on 03/17/2021 for the symptoms described in the history of present illness. She was evaluated in the context of the global COVID-19 pandemic, which necessitated consideration that the patient might be at risk for infection with the SARS-CoV-2 virus that causes COVID-19. Institutional protocols and algorithms that pertain to the evaluation of patients at risk for COVID-19 are in a state of rapid change based on information released by regulatory bodies including the CDC and federal and state organizations. These policies and algorithms were followed during the patient's care in the ED. Final Clinical Impression(s) / ED Diagnoses Final diagnoses:  Mild intermittent asthma with exacerbation  Back muscle spasm  Non-intractable vomiting with nausea, unspecified vomiting type    Rx / DC Orders ED Discharge Orders         Ordered    predniSONE (STERAPRED UNI-PAK 21 TAB) 10 MG (21) TBPK  tablet        03/17/21 1852    ondansetron (ZOFRAN ODT) 4 MG disintegrating tablet  Every 8 hours PRN        03/17/21 1852    HYDROcodone-acetaminophen (NORCO/VICODIN) 5-325 MG tablet  Every 4 hours PRN        03/17/21 1852           [name], MD 03/17/21 1858

## 2021-03-17 NOTE — ED Notes (Addendum)
Pt refused hour long neb. States "I need to leave". Notified MD Haviland. Requesting to remove IV. IV removed states "I cannot wait I have to pick up my kid". MD Haviland at bedside.

## 2021-03-17 NOTE — ED Triage Notes (Signed)
Emergency Medicine Provider Triage Evaluation Note  Kathleen [name] , a 60 y.o. female  was evaluated in triage.  Pt complains of shortness of breath.  She ran out of her inhaler about 2 hours ago when she was outside.  She has had worsening shortness of breath and vomiting since.      Physical Exam  LMP 11/08/2014  Patient is awake and alert.  She is vomiting and hand.  She has obvious shortness of breath with diffuse rhonchi and wheezes in all lung fields.  Her voice is quiet due to poor air movement.  She is vomiting.   Medical Decision Making  Medically screening exam initiated at 4:15 PM.  Appropriate orders placed.  KENNIS BUELL was informed that the remainder of the evaluation will be completed by another provider, this initial triage assessment does not replace that evaluation, and the importance of remaining in the ED until their evaluation is complete.  Lab work and albuterol inhaler ordered.  Charge RN informed that patient needs a room in the back.      Lorin Glass, [place] 03/17/21 1635

## 2021-03-17 NOTE — ED Triage Notes (Signed)
Pt c/o asthma attack x 2 hrs, states she ran out of inhaler and having dyspnea. Pt 99% ra. Pt has non-productive cough

## 2021-03-19 ENCOUNTER — Other Ambulatory Visit: Payer: Self-pay

## 2021-03-19 ENCOUNTER — Ambulatory Visit (INDEPENDENT_AMBULATORY_CARE_PROVIDER_SITE_OTHER): Payer: Medicaid Other | Admitting: Internal Medicine

## 2021-03-19 ENCOUNTER — Encounter: Payer: Self-pay | Admitting: Internal Medicine

## 2021-03-19 VITALS — BP 112/75 | HR 102 | Temp 98.1°F | Ht 63.0 in | Wt 159.4 lb

## 2021-03-19 DIAGNOSIS — J449 Chronic obstructive pulmonary disease, unspecified: Secondary | ICD-10-CM | POA: Diagnosis present

## 2021-03-19 DIAGNOSIS — K219 Gastro-esophageal reflux disease without esophagitis: Secondary | ICD-10-CM | POA: Diagnosis not present

## 2021-03-19 DIAGNOSIS — J4489 Other specified chronic obstructive pulmonary disease: Secondary | ICD-10-CM

## 2021-03-19 MED ORDER — DULERA 200-5 MCG/ACT IN AERO: 2.0000 | INHALATION_SPRAY | Freq: Four times a day (QID) | RESPIRATORY_TRACT | 11 refills | Status: DC

## 2021-03-19 MED ORDER — SPIRIVA HANDIHALER 18 MCG IN CAPS: 18.0000 ug | ORAL_CAPSULE | Freq: Every day | RESPIRATORY_TRACT | 2 refills | Status: DC

## 2021-03-19 MED ORDER — DEXTROMETHORPHAN HBR 15 MG/5ML PO SYRP: 10.0000 mL | ORAL_SOLUTION | Freq: Four times a day (QID) | ORAL | 0 refills | Status: DC | PRN

## 2021-03-19 MED ORDER — MONTELUKAST SODIUM 10 MG PO TABS: 10.0000 mg | ORAL_TABLET | Freq: Every day | ORAL | 3 refills | Status: DC

## 2021-03-19 MED ORDER — BUDESONIDE-FORMOTEROL FUMARATE 160-4.5 MCG/ACT IN AERO: 2.0000 | INHALATION_SPRAY | Freq: Once | RESPIRATORY_TRACT | 12 refills | Status: DC

## 2021-03-19 MED ORDER — PROAIR HFA 108 (90 BASE) MCG/ACT IN AERS: INHALATION_SPRAY | RESPIRATORY_TRACT | 2 refills | Status: DC

## 2021-03-19 MED ORDER — PANTOPRAZOLE SODIUM 40 MG PO TBEC: 40.0000 mg | DELAYED_RELEASE_TABLET | Freq: Every day | ORAL | 1 refills | Status: DC

## 2021-03-19 MED ORDER — ALBUTEROL SULFATE (2.5 MG/3ML) 0.083% IN NEBU
2.5000 mg | INHALATION_SOLUTION | RESPIRATORY_TRACT | 2 refills | Status: DC | PRN
Start: 2021-03-19 — End: 2021-03-24

## 2021-03-19 NOTE — Progress Notes (Addendum)
CC: Shortness of breath  HPI: Kathleen [name] is a 60 y.o. with PMH listed below presenting with complaint of . Please see problem based assessment and plan for further details.  Past Medical History:  Diagnosis Date  . Anxiety   . Arthritis    hands  . Asthma    2 sets of PFT's in 04/09 without sign variability. Last set with significant decrease in FEV1 with saline alone, suggesting Asthma but  recommended clinical corelation   . Asthma   . Bipolar 1 disorder [facility])    therapist is Caryl Asp and is followed by [employer]  . Blackout    negative work up including ESR, ANA, opthalmology referral, carotid dopplers, 2D echo, MRI and EEG.  . BREAST LUMP 03/25/2008   Annotation: bilaterally Qualifier: Diagnosis of  By: Tomasa Hosteller MD, Edmon Crape.   . 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 Bronchitis   . Chronic headache   . Chronic low back pain 08/08/2008   Qualifier: Diagnosis of  By: Redmond Pulling  MD, Mateo Flow    . Chronic pain    normal work up including TSH, RPR, B12, HIV, plain films, 2 ESR's, ANA, CK, RF along with routine CBC, CMET and UA. Further work up includes CRP, ESR, SPEP/UPEP, hepatitis erology, A1C , repeat ANA  . COPD (chronic obstructive pulmonary disease) ([place])   . COPD exacerbation ([place]) 03/31/2019  . Depression   . DUB (dysfunctional uterine bleeding)    and pelvic pain, with negative endometrial bx in 07/09 and transvaginal U/S significant for mild fibroids in 0/09.  . Emphysema of lung ([place])   . GERD (gastroesophageal reflux disease)   . Hallucinations 03/18/2008   Qualifier: Diagnosis of  By: Redmond Pulling  MD, Mateo Flow    . Hyperlipidemia   . Lower extremity edema    Neg ABI's, normal 2D echo, normal albumin  . Menorrhagia   . Ovarian cyst   . Polysubstance abuse ([place])    none since March 17,2009.  Marland Kitchen Sleep apnea    NO CPAP  . 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 Tubular  adenoma of colon     Review of Systems: Review of Systems  Constitutional: Negative for chills, fever and malaise/fatigue.  Eyes: Negative for blurred vision.  Respiratory: Positive for shortness of breath and wheezing.   Cardiovascular: Negative for chest pain, palpitations and leg swelling.  Gastrointestinal: Negative for constipation, diarrhea, nausea and vomiting.  Genitourinary: Negative for dysuria.  Neurological: Negative for dizziness and headaches.  All other systems reviewed and are negative.    Physical Exam: Vitals:   03/19/21 1415  BP: 112/75  Pulse: (!) 102  Temp: 98.1 F (36.7 C)  TempSrc: Oral  SpO2: 97%  Weight: 159 lb 6.4 oz (72.3 kg)  Height: _0  (1.6 m)   Gen: Well-developed, well nourished, NAD HEENT: NCAT head, hearing intact CV: Tachycardic, regular rhythm, S1, S2 normal Pulm: Prolonged expiratory phase with wheezing, no rales, tachypneic Extm: ROM intact, Peripheral pulses intact, No peripheral edema Skin: Dry, Warm, normal turgor, no wounds, no rashes, no lesions  Assessment & Plan:   Asthma-COPD overlap syndrome ([place]) Kathleen [name] is a 60 yo F w/ PMH of GERD, COPD gold D w/ Asthma overlap, GAD, GERD presenting to [facility] for COPD. She mentions that she has ran out of her maintenance inhaler due to being very busy with  taking care of her daughter and had to go to the ED 3 days prior for shortness of breath. She was evaluated in the ED after receiving solumedrol, duoneb treatment and magnesium. She did not stay to complete treatment as she had to leave quickly to pick up her daughter. She mentions that since leaving, she has been taking her prednisone as prescribed but she has not picked up her maintenance inhalers. She also needs additional refills for her nebulizer solution. Denies fevers, chills, sputum production. Endorsing dyspnea on exertion and wheezing.  A/P Presents to clinic after ED visit for COPD exacerbation. Prescribed prednisone taper. Has no  inhaler at home. 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 - Refills provided for Dulera, Spiriva, montelukast - Work letter provided - Referral to [facility] for medication assistance - Discussed need to follow up on previously ordered PFT but pt mentions difficulty with current social barriers, including financial situation.  GERD Pt requires refills on medications with associated diagnosis above.  Reviewed disease process and find this medication to be necessary, will not change dose or alter current therapy.     Patient discussed with Dr. Dareen Piano  -Gilberto Better, [facility] Pager: [phone]

## 2021-03-19 NOTE — Assessment & Plan Note (Signed)
Pt requires refills on medications with associated diagnosis above.  Reviewed disease process and find this medication to be necessary, will not change dose or alter current therapy. 

## 2021-03-19 NOTE — Assessment & Plan Note (Addendum)
Kathleen [name] is a 61 yo F w/ PMH of GERD, COPD gold D w/ Asthma overlap, GAD, GERD presenting to [facility] for COPD. She mentions that she has ran out of her maintenance inhaler due to being very busy with taking care of her daughter and had to go to the ED 3 days prior for shortness of breath. She was evaluated in the ED after receiving solumedrol, duoneb treatment and magnesium. She did not stay to complete treatment as she had to leave quickly to pick up her daughter. She mentions that since leaving, she has been taking her prednisone as prescribed but she has not picked up her maintenance inhalers. She also needs additional refills for her nebulizer solution. Denies fevers, chills, sputum production. Endorsing dyspnea on exertion and wheezing.  A/P Presents to clinic after ED visit for COPD exacerbation. Prescribed prednisone taper. Has no inhaler at home. 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 - Refills provided for Dulera, Spiriva, montelukast - Work letter provided - Referral to [facility] for medication assistance - Discussed need to follow up on previously ordered PFT but pt mentions difficulty with current social barriers, including financial situation.

## 2021-03-19 NOTE — Patient Instructions (Signed)
Dear [name],  Thank you for allowing [place] to provide your care today. Today we discussed your asthma/copd    I have ordered no labs for you. I will call if any are abnormal.    Today we made no changes to your medications:    Please use your inhalers as prescribed  Please follow-up as needed.    Please call the internal medicine center clinic if you have any questions or concerns, we may be able to help and keep you from a long and expensive emergency room wait. Our clinic and after hours phone number is [phone], the best time to call is Monday through Friday 9 am to 4 pm but there is always someone available 24/7 if you have an emergency. If you need medication refills please notify your pharmacy one week in advance and they will send [place] a request.    If you have not gotten the COVID vaccine, I recommend doing so:  You may get it at your local [facility] or [facility] OR To schedule an appointment for a COVID vaccine or be added to the vaccine wait list: Go to [email or web address]   OR Go to [email or web address]                  OR Call [phone]                                     OR Call [phone] and select Option 2  Thank you for choosing [facility]   Chronic Obstructive Pulmonary Disease Exacerbation Chronic obstructive pulmonary disease (COPD) is a long-term (chronic) lung problem. In COPD, the flow of air from the lungs is limited. COPD exacerbations are times that breathing gets worse and you need more than your normal treatment. Without treatment, they can be life threatening. If they happen often, your lungs can become more damaged. If your COPD gets worse, your doctor may treat you with:  Medicines.  Oxygen.  Different ways to clear your airway, such as using a mask. Follow these instructions at home: Medicines  Take over-the-counter and prescription medicines only as told by your doctor.  If you take an antibiotic or steroid medicine, do  not stop taking the medicine even if you start to feel better.  Keep up with shots (vaccinations) as told by your doctor. Be sure to get a yearly (annual) flu shot. Lifestyle  Do not smoke. If you need help quitting, ask your doctor.  Eat healthy foods.  Exercise regularly.  Get plenty of sleep.  Avoid tobacco smoke and other things that can bother your lungs.  Wash your hands often with soap and water. This will help keep you from getting an infection. If you cannot use soap and water, use hand sanitizer.  During flu season, avoid areas that are crowded with people. General instructions  Drink enough fluid to keep your pee (urine) clear or pale yellow. Do not do this if your doctor has told you not to.  Use a cool mist machine (vaporizer).  If you use oxygen or a machine that turns medicine into a mist (nebulizer), continue to use it as told.  Follow all instructions for rehabilitation. These are steps you can take to make your body work better.  Keep all follow-up visits as told by your doctor. This is important. Contact a doctor if:  Your COPD symptoms get worse than normal. Get help right  away if:  You are short of breath and it gets worse.  You have trouble talking.  You have chest pain.  You cough up blood.  You have a fever.  You keep throwing up (vomiting).  You feel weak or you pass out (faint).  You feel confused.  You are not able to sleep because of your symptoms.  You are not able to do daily activities. Summary  COPD exacerbations are times that breathing gets worse and you need more treatment than normal.  COPD exacerbations can be very serious and may cause your lungs to become more damaged.  Do not smoke. If you need help quitting, ask your doctor.  Stay up-to-date on your shots. Get a flu shot every year. This information is not intended to replace advice given to you by your health care provider. Make sure you discuss any questions you  have with your health care provider. Document Revised: 11/11/2017 Document Reviewed: 01/03/2017 Elsevier Patient Education  2021 [employer].

## 2021-03-23 NOTE — Progress Notes (Signed)
Internal Medicine Clinic Attending  Case discussed with Dr. Lee  At the time of the visit.  We reviewed the resident's history and exam and pertinent patient test results.  I agree with the assessment, diagnosis, and plan of care documented in the resident's note.    

## 2021-03-24 ENCOUNTER — Ambulatory Visit (INDEPENDENT_AMBULATORY_CARE_PROVIDER_SITE_OTHER): Payer: Medicaid Other | Admitting: Internal Medicine

## 2021-03-24 ENCOUNTER — Encounter: Payer: Self-pay | Admitting: Internal Medicine

## 2021-03-24 ENCOUNTER — Telehealth: Payer: Self-pay | Admitting: *Deleted

## 2021-03-24 ENCOUNTER — Other Ambulatory Visit: Payer: Self-pay

## 2021-03-24 VITALS — BP 104/69 | HR 96 | Temp 98.7°F | Ht 63.0 in | Wt 157.6 lb

## 2021-03-24 DIAGNOSIS — J4489 Other specified chronic obstructive pulmonary disease: Secondary | ICD-10-CM

## 2021-03-24 DIAGNOSIS — J45901 Unspecified asthma with (acute) exacerbation: Secondary | ICD-10-CM | POA: Diagnosis not present

## 2021-03-24 DIAGNOSIS — J302 Other seasonal allergic rhinitis: Secondary | ICD-10-CM

## 2021-03-24 DIAGNOSIS — J449 Chronic obstructive pulmonary disease, unspecified: Secondary | ICD-10-CM | POA: Diagnosis present

## 2021-03-24 DIAGNOSIS — J441 Chronic obstructive pulmonary disease with (acute) exacerbation: Secondary | ICD-10-CM

## 2021-03-24 MED ORDER — ALBUTEROL SULFATE (2.5 MG/3ML) 0.083% IN NEBU: 2.5000 mg | INHALATION_SOLUTION | RESPIRATORY_TRACT | 2 refills | Status: DC | PRN

## 2021-03-24 MED ORDER — PREDNISONE 20 MG PO TABS: 40.0000 mg | ORAL_TABLET | Freq: Every day | ORAL | 0 refills | Status: AC

## 2021-03-24 MED ORDER — AZITHROMYCIN 250 MG PO TABS: ORAL_TABLET | ORAL | 0 refills | Status: AC

## 2021-03-24 NOTE — Progress Notes (Signed)
CC: COPD exacerbation   HPI:  [name] is a 60 y.o. female with a past medical history stated below and presents today for COPD. Please see problem based assessment and plan for additional details.  Past Medical History:  Diagnosis Date  . Anxiety   . Arthritis    hands  . Asthma    2 sets of PFT's in 04/09 without sign variability. Last set with significant decrease in FEV1 with saline alone, suggesting Asthma but  recommended clinical corelation   . Asthma   . Bipolar 1 disorder [facility])    therapist is Caryl Asp and is followed by [facility] [facility]  . Blackout    negative work up including ESR, ANA, opthalmology referral, carotid dopplers, 2D echo, MRI and EEG.  . BREAST LUMP 03/25/2008   Annotation: bilaterally Qualifier: Diagnosis of  By: Tomasa Hosteller MD, Edmon Crape.   . 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 Bronchitis   . Chronic headache   . Chronic low back pain 08/08/2008   Qualifier: Diagnosis of  By: Redmond Pulling  MD, Mateo Flow    . Chronic pain    normal work up including TSH, RPR, B12, HIV, plain films, 2 ESR's, ANA, CK, RF along with routine CBC, CMET and UA. Further work up includes CRP, ESR, SPEP/UPEP, hepatitis erology, A1C , repeat ANA  . COPD (chronic obstructive pulmonary disease) ([place])   . COPD exacerbation ([place]) 03/31/2019  . Depression   . DUB (dysfunctional uterine bleeding)    and pelvic pain, with negative endometrial bx in 07/09 and transvaginal U/S significant for mild fibroids in 0/09.  . Emphysema of lung ([place])   . GERD (gastroesophageal reflux disease)   . Hallucinations 03/18/2008   Qualifier: Diagnosis of  By: Redmond Pulling  MD, Mateo Flow    . Hyperlipidemia   . Lower extremity edema    Neg ABI's, normal 2D echo, normal albumin  . Menorrhagia   . Ovarian cyst   . Polysubstance abuse ([place])    none since March 17,2009.  Marland Kitchen Sleep apnea    NO CPAP  . Stroke [facility])    heat stroke 07/10/19  . 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 Tubular adenoma of colon     Current Outpatient Medications on File Prior to Visit  Medication Sig Dispense Refill  . ASPIRIN ADULT LOW STRENGTH 81 MG EC tablet TAKE 1 Tablet BY MOUTH ONCE DAILY (Patient taking differently: Take 81 mg by mouth daily.) 90 tablet 1  . dextromethorphan 15 MG/5ML syrup Take 10 mLs (30 mg total) by mouth 4 (four) times daily as needed for cough. 120 mL 0  . DULERA 200-5 MCG/ACT AERO Inhale 2 puffs into the lungs every 6 (six) hours. 8.8 each 11  . EPINEPHRINE 0.3 mg/0.3 mL IJ SOAJ injection INJECT CONTENTS OF 1 PEN AS NEEDED FOR ALLERGIC REACTION 2 each 0  . HYDROcodone-acetaminophen (NORCO/VICODIN) 5-325 MG tablet Take 1 tablet by mouth every 4 (four) hours as needed. 10 tablet 0  . lidocaine (LIDODERM) 5 % Place 1 patch onto the skin daily. Remove & Discard patch within 12 hours or as directed by MD 30 patch 1  . loratadine (CLARITIN) 10 MG tablet Take 1 tablet (10 mg total) by mouth daily. (Patient not taking: No sig reported) 30 tablet 11  . montelukast (SINGULAIR) 10 MG tablet Take 1 tablet (10 mg total) by mouth at bedtime. 90 tablet 3  . ondansetron (  ZOFRAN) 4 MG tablet TAKE 1 TABLET BY MOUTH ONCE DAILY AS NEEDED FOR NAUSEA FOR VOMITING 30 tablet 0  . pantoprazole (PROTONIX) 40 MG tablet Take 1 tablet (40 mg total) by mouth daily. 90 tablet 1  . PROAIR HFA 108 (90 Base) MCG/ACT inhaler INHALE 1 TO 2 PUFFS BY MOUTH EVERY 6 HOURS AS NEEDED FOR WHEEZING FOR SHORTNESS OF BREATH 8 g 2  . tiotropium (SPIRIVA HANDIHALER) 18 MCG inhalation capsule Place 1 capsule (18 mcg total) into inhaler and inhale daily. 30 capsule 2  . [DISCONTINUED] divalproex (DEPAKOTE ER) 500 MG 24 hr tablet Take 1 tablet (500 mg total) by mouth 2 (two) times daily. (Patient not taking: Reported on 10/08/2019) 180 tablet 0  . [DISCONTINUED] fluticasone (FLONASE) 50 MCG/ACT nasal spray Place 1 spray into both nostrils daily. (Patient not taking: Reported on 07/11/2019) 16 g 11  .  [DISCONTINUED] ipratropium-albuterol (DUONEB) 0.5-2.5 (3) MG/3ML SOLN Take 3 mLs by nebulization every 6 (six) hours as needed (wheezing or shortness of breath). (Patient not taking: Reported on 03/19/2020) 360 mL 1  . [DISCONTINUED] metoCLOPramide (REGLAN) 5 MG tablet Take 1 tablet (5 mg total) by mouth 3 (three) times daily before meals for 5 days. (Patient not taking: Reported on 03/19/2020) 15 tablet 0  . [DISCONTINUED] SUMAtriptan (IMITREX) 25 MG tablet Take 1 tablet (25 mg total) by mouth every 2 (two) hours as needed for migraine or headache. (Patient not taking: Reported on 10/08/2019) 10 tablet 0   No current facility-administered medications on file prior to visit.    Family History  Problem Relation Age of Onset  . Colon cancer Mother 9  . Breast cancer Mother 42  . Rectal cancer Mother   . Diabetes Father   . Hypertension Father   . Kidney disease Father   . Colon cancer Father 43  . Prostate cancer Father   . Cancer Father   . Kidney disease Sister   . Cancer Brother   . Breast cancer Maternal Grandmother 103  . Esophageal cancer Neg Hx   . Stomach cancer Neg Hx     Social History   Socioeconomic History  . Marital status: Single    Spouse name: Not on file  . Number of children: Not on file  . Years of education: Not on file  . Highest education level: Not on file  Occupational History  . Not on file  Tobacco Use  . Smoking status: Former Smoker    Packs/day: 0.25    Years: 30.00    Pack years: 7.50    Types: Cigarettes    Quit date: 12/28/2016    Years since quitting: 4.2  . Smokeless tobacco: Never Used  . Tobacco comment: Occ. Wants Nicotine Patches   Vaping Use  . Vaping Use: Never used  Substance and Sexual Activity  . Alcohol use: Yes    Alcohol/week: 0.0 standard drinks    Comment: rarely  . Drug use: Not Currently    Types: Marijuana    Comment: Sometimes.  . Sexual activity: Yes    Birth control/protection: None    Comment: monogamous  Other  Topics Concern  . Not on file  Social History Narrative   ** Merged History Encounter **       Lives between [place] and boyfriend's house , currently trying to abstain from illegal substances, continues to smoke and says nicotine patch made her sick.   Social Determinants of Health   Financial Resource Strain: Not on file  Food Insecurity: Not on file  Transportation Needs: Not on file  Physical Activity: Not on file  Stress: Not on file  Social Connections: Not on file  Intimate Partner Violence: Not on file    Review of Systems: ROS negative except for what is noted on the assessment and plan.  Vitals:   03/24/21 1432  BP: 104/69  Pulse: 96  Temp: 98.7 F (37.1 C)  TempSrc: Oral  SpO2: 97%  Weight: 157 lb 9.6 oz (71.5 kg)  Height: '5\' 3"'  (1.6 m)    Physical Exam: Gen: A&O x3 and appears anxious and in some distress due to wheezing HEENT: Head - normocephalic, atraumatic. Eye -  visual acuity grossly intact, conjunctiva clear, sclera non-icteric, EOM intact. Mouth - No obvious caries or periodontal disease. Neck: no obvious masses or nodules, AROM intact. CV: RRR, no murmurs, rubs, or gallops. S1/S2 presents  Resp: Diffuse expiratory wheezing without overt respiratory distress Abd: BS (+) x4, soft, non-tender, without obvious hepatosplenomegaly or masses MSK: Grossly normal AROM and strength x4 extremities. Skin: good skin turgor, no rashes, unusual bruising, or prominent lesions.  Neuro: No focal deficits, grossly normal sensation and coordination.  Psych: Oriented x3 and responding appropriately. Intact recent and remote memory, normal mood, judgement, affect , and insight.    Assessment & Plan:   See Encounters Tab for problem based charting.  Patient discussed with Dr. Bertell Maria, D.O. [facility], PGY-2 Pager: [phone], Phone: [phone] Date 03/26/2021 Time 5:04 AM

## 2021-03-24 NOTE — Patient Instructions (Signed)
Thank you, [name] for allowing [place] to provide your care today. Today we discussed COPD.    I have ordered the following labs for you:  Lab Orders  No laboratory test(s) ordered today     Tests ordered today:  none  Referrals ordered today:   Referral Orders  No referral(s) requested today     Medication Changes:   Medications Discontinued During This Encounter  Medication Reason  . predniSONE (STERAPRED UNI-PAK 21 TAB) 10 MG (21) TBPK tablet   . albuterol (PROVENTIL) (2.5 MG/3ML) 0.083% nebulizer solution Reorder     Meds ordered this encounter  Medications  . azithromycin (ZITHROMAX) 250 MG tablet    Sig: Take 2 tablets (500 mg total) by mouth daily for 1 day, THEN 1 tablet (250 mg total) daily for 4 days. Take 2 tablets (500 mg) on  Day 1,  followed by 1 tablet (250 mg) once daily on Days 2 through 5..    Dispense:  6 tablet    Refill:  0  . predniSONE (DELTASONE) 20 MG tablet    Sig: Take 2 tablets (40 mg total) by mouth daily with breakfast for 5 days.    Dispense:  10 tablet    Refill:  0  . albuterol (PROVENTIL) (2.5 MG/3ML) 0.083% nebulizer solution    Sig: Take 3 mLs (2.5 mg total) by nebulization every 4 (four) hours as needed for wheezing or shortness of breath.    Dispense:  75 mL    Refill:  2     Instructions:  - Please take the medications prescribed - Please get your pulmonary function testing done as we discussed.   Follow up: as needed   Remember:   Should you have any questions or concerns please call the internal medicine clinic at [phone].     [name], D.O. [place]

## 2021-03-24 NOTE — Telephone Encounter (Signed)
Walk-in Pt state she was seen last week and no feeling any better. Audible wheezes and sob noted. No appts available on yellow team. Appt scheduled with Dr [name] @ 1445 PM. Regino Schultze RN informed.

## 2021-03-25 ENCOUNTER — Telehealth: Payer: Self-pay

## 2021-03-25 NOTE — Telephone Encounter (Signed)
Pt is requesting a call back .. she stated that she is out of mask for her nebulizer  And she is needed help to find out  Where she can get some

## 2021-03-25 NOTE — Telephone Encounter (Signed)
Call to [facility]-pt can contact [facility] @ [phone] to order masks.  [employer] went ahead and placed new order today with CMA-mask will ship out today via [employer] with expected delivery in 1-2 days.  Pt aware and given number to call for future order. Pt thankful. No further action needed at this time, phone call complete.Despina Hidden Cassady4/13/202211:33 AM

## 2021-03-26 ENCOUNTER — Encounter: Payer: Self-pay | Admitting: Internal Medicine

## 2021-03-26 MED ORDER — FLUTICASONE PROPIONATE 50 MCG/ACT NA SUSP: 1.0000 | Freq: Every day | NASAL | 11 refills | Status: DC

## 2021-03-26 MED ORDER — LORATADINE 10 MG PO TABS: 10.0000 mg | ORAL_TABLET | Freq: Every day | ORAL | 11 refills | Status: DC

## 2021-03-26 NOTE — Assessment & Plan Note (Addendum)
Patient presents for reevaluation of COPD/Asthma. Patient has had multiple visits for COPD in the past few weeks. She was seen in the ED on the 4th and then here in [facility] on the 7th. At her last clinic visit she was given a 5 day course of steroids and a refill on her inhalers.   She states that she has had shortness of breath, increased productive cough, myalgias, and fever as high as 101 that has improved with tylenol. She denies any recent sick contacts or travel. She denies rhinorrhea, congestion, or sinus pressure, but believes that many of her symptoms were triggered by the change in seasons/pollen. She is not on supplemental oxygen at home and she has not had PFTs preformed. She is currently taking Albuterol inhaler/nebulizer, Spiriva inhaler, Montelukast and was sent to our clinical pharmacist for further medication management.   Physical exam confirms diffuse expiratory wheezing.  Assessment: Patient likely has a COPD exacerbation.   Plan: 1. Prescribe 5 day course of azithromycin and prednisone  2. Will refill her albuterol nebulizers 3. Counseled her on the importance of PFTs as she may need an inhaler corticosteroid for better control of her symptoms.  4. Refilled her Claritin and flonase

## 2021-03-29 NOTE — Progress Notes (Signed)
Internal Medicine Clinic Attending  Case discussed with Dr. Coe  At the time of the visit.  We reviewed the resident's history and exam and pertinent patient test results.  I agree with the assessment, diagnosis, and plan of care documented in the resident's note.  

## 2021-03-31 ENCOUNTER — Telehealth: Payer: Self-pay

## 2021-03-31 NOTE — Telephone Encounter (Signed)
Need refill on peridsone, azithromycin 250mg   ;pt contact [place], [place]

## 2021-03-31 NOTE — Telephone Encounter (Addendum)
Return pt's call who stated she needs another rxs for prednisone and azithromycin. Stated the first course is working but she needs "more". Informed she may need another an appt to be evaluated. Stated  she was seen by Dr [name] on 4//12.

## 2021-04-01 NOTE — Telephone Encounter (Signed)
Called pt - no answer. Left message on self-identified vm to call the office to schedule an appt to be re-evaluate for more meds per Dr [name].

## 2021-04-01 NOTE — Telephone Encounter (Signed)
She will need to be reevaluated prior to more medications.

## 2021-04-02 ENCOUNTER — Other Ambulatory Visit: Payer: Self-pay

## 2021-04-02 ENCOUNTER — Encounter: Payer: Self-pay | Admitting: Internal Medicine

## 2021-04-02 ENCOUNTER — Ambulatory Visit (INDEPENDENT_AMBULATORY_CARE_PROVIDER_SITE_OTHER): Payer: Medicaid Other | Admitting: Internal Medicine

## 2021-04-02 VITALS — BP 113/81 | HR 64 | Temp 98.7°F | Ht 63.0 in | Wt 158.5 lb

## 2021-04-02 DIAGNOSIS — K219 Gastro-esophageal reflux disease without esophagitis: Secondary | ICD-10-CM

## 2021-04-02 DIAGNOSIS — J449 Chronic obstructive pulmonary disease, unspecified: Secondary | ICD-10-CM | POA: Diagnosis present

## 2021-04-02 DIAGNOSIS — J4489 Other specified chronic obstructive pulmonary disease: Secondary | ICD-10-CM

## 2021-04-02 MED ORDER — ALBUTEROL SULFATE (2.5 MG/3ML) 0.083% IN NEBU: 2.5000 mg | INHALATION_SOLUTION | RESPIRATORY_TRACT | 2 refills | Status: DC | PRN

## 2021-04-02 MED ORDER — PANTOPRAZOLE SODIUM 40 MG PO TBEC: 40.0000 mg | DELAYED_RELEASE_TABLET | Freq: Every day | ORAL | 1 refills | Status: DC

## 2021-04-02 NOTE — Patient Instructions (Addendum)
Thank you for allowing [place] to provide your care today. Today we discussed your chest tightness    I have ordered no labs for you. I will call if any are abnormal.    Today we made no changes to your medications.    Please follow-up as needed.    Should you have any questions or concerns please call the internal medicine clinic at [phone].     Asthma, Adult  Asthma is a long-term (chronic) condition in which the airways get tight and narrow. The airways are the breathing passages that lead from the nose and mouth down into the lungs. A person with asthma will have times when symptoms get worse. These are called asthma attacks. They can cause coughing, whistling sounds when you breathe (wheezing), shortness of breath, and chest pain. They can make it hard to breathe. There is no cure for asthma, but medicines and lifestyle changes can help control it. There are many things that can bring on an asthma attack or make asthma symptoms worse (triggers). Common triggers include:  Mold.  Dust.  Cigarette smoke.  Cockroaches.  Things that can cause allergy symptoms (allergens). These include animal skin flakes (dander) and pollen from trees or grass.  Things that pollute the air. These may include household cleaners, wood smoke, smog, or chemical odors.  Cold air, weather changes, and wind.  Crying or laughing hard.  Stress.  Certain medicines or drugs.  Certain foods such as dried fruit, potato chips, and grape juice.  Infections, such as a cold or the flu.  Certain medical conditions or diseases.  Exercise or tiring activities. Asthma may be treated with medicines and by staying away from the things that cause asthma attacks. Types of medicines may include:  Controller medicines. These help prevent asthma symptoms. They are usually taken every day.  Fast-acting reliever or rescue medicines. These quickly relieve asthma symptoms. They are used as needed and provide  short-term relief.  Allergy medicines if your attacks are brought on by allergens.  Medicines to help control the body's defense (immune) system. Follow these instructions at home: Avoiding triggers in your home  Change your heating and air conditioning filter often.  Limit your use of fireplaces and wood stoves.  Get rid of pests (such as roaches and mice) and their droppings.  Throw away plants if you see mold on them.  Clean your floors. Dust regularly. Use cleaning products that do not smell.  Have someone vacuum when you are not home. Use a vacuum cleaner with a HEPA filter if possible.  Replace carpet with wood, tile, or vinyl flooring. Carpet can trap animal skin flakes and dust.  Use allergy-proof pillows, mattress covers, and box spring covers.  Wash bed sheets and blankets every week in hot water. Dry them in a dryer.  Keep your bedroom free of any triggers.  Avoid pets and keep windows closed when things that cause allergy symptoms are in the air.  Use blankets that are made of polyester or cotton.  Clean bathrooms and kitchens with bleach. If possible, have someone repaint the walls in these rooms with mold-resistant paint. Keep out of the rooms that are being cleaned and painted.  Wash your hands often with soap and water. If soap and water are not available, use hand sanitizer.  Do not allow anyone to smoke in your home. General instructions  Take over-the-counter and prescription medicines only as told by your doctor. ? Talk with your doctor if you have questions  about how or when to take your medicines. ? Make note if you need to use your medicines more often than usual.  Do not use any products that contain nicotine or tobacco, such as cigarettes and e-cigarettes. If you need help quitting, ask your doctor.  Stay away from secondhand smoke.  Avoid doing things outdoors when allergen counts are high and when air quality is low.  Wear a ski mask when  doing outdoor activities in the winter. The mask should cover your nose and mouth. Exercise indoors on cold days if you can.  Warm up before you exercise. Take time to cool down after exercise.  Use a peak flow meter as told by your doctor. A peak flow meter is a tool that measures how well the lungs are working.  Keep track of the peak flow meter's readings. Write them down.  Follow your asthma action plan. This is a written plan for taking care of your asthma and treating your attacks.  Make sure you get all the shots (vaccines) that your doctor recommends. Ask your doctor about a flu shot and a pneumonia shot.  Keep all follow-up visits as told by your doctor. This is important. Contact a doctor if:  You have wheezing, shortness of breath, or a cough even while taking medicine to prevent attacks.  The mucus you cough up (sputum) is thicker than usual.  The mucus you cough up changes from clear or white to yellow, green, gray, or bloody.  You have problems from the medicine you are taking, such as: ? A rash. ? Itching. ? Swelling. ? Trouble breathing.  You need reliever medicines more than 2-3 times a week.  Your peak flow reading is still at 50-79% of your personal best after following the action plan for 1 hour.  You have a fever. Get help right away if:  You seem to be worse and are not responding to medicine during an asthma attack.  You are short of breath even at rest.  You get short of breath when doing very little activity.  You have trouble eating, drinking, or talking.  You have chest pain or tightness.  You have a fast heartbeat.  Your lips or fingernails start to turn blue.  You are light-headed or dizzy, or you faint.  Your peak flow is less than 50% of your personal best.  You feel too tired to breathe normally. Summary  Asthma is a long-term (chronic) condition in which the airways get tight and narrow. An asthma attack can make it hard to  breathe.  Asthma cannot be cured, but medicines and lifestyle changes can help control it.  Make sure you understand how to avoid triggers and how and when to use your medicines. This information is not intended to replace advice given to you by your health care provider. Make sure you discuss any questions you have with your health care provider. Document Revised: 04/02/2020 Document Reviewed: 04/02/2020 Elsevier Patient Education  2021 [employer].

## 2021-04-02 NOTE — Progress Notes (Signed)
CC: Chest congestion  HPI: Kathleen [name] is a 60 y.o. with PMH listed below presenting with complaint of chest congestion. Please see problem based assessment and plan for further details.  Past Medical History:  Diagnosis Date  . Anxiety   . Arthritis    hands  . Asthma    2 sets of PFT's in 04/09 without sign variability. Last set with significant decrease in FEV1 with saline alone, suggesting Asthma but  recommended clinical corelation   . Asthma   . Bipolar 1 disorder Kathleen [name])    therapist is Kathleen [name] and is followed by [facility]  . Blackout    negative work up including ESR, ANA, opthalmology referral, carotid dopplers, 2D echo, MRI and EEG.  . BREAST LUMP 03/25/2008   Annotation: bilaterally Qualifier: Diagnosis of  By: Kathleen Hosteller MD, Kathleen [name].   . 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 Bronchitis   . Chronic headache   . Chronic low back pain 08/08/2008   Qualifier: Diagnosis of  By: Kathleen Pulling  MD, Mateo [name]    . Chronic pain    normal work up including TSH, RPR, B12, HIV, plain films, 2 ESR's, ANA, CK, RF along with routine CBC, CMET and UA. Further work up includes CRP, ESR, SPEP/UPEP, hepatitis erology, A1C , repeat ANA  . COPD (chronic obstructive pulmonary disease) (Kathleen [name])   . COPD exacerbation ([place]) 03/31/2019  . Depression   . DUB (dysfunctional uterine bleeding)    and pelvic pain, with negative endometrial bx in 07/09 and transvaginal U/S significant for mild fibroids in 0/09.  . Emphysema of lung ([place])   . GERD (gastroesophageal reflux disease)   . Hallucinations 03/18/2008   Qualifier: Diagnosis of  By: Kathleen Pulling  MD, Mateo [name]    . Hyperlipidemia   . Lower extremity edema    Neg ABI's, normal 2D echo, normal albumin  . Menorrhagia   . Ovarian cyst   . Polysubstance abuse ([place])    none since March 17,2009.  Marland Kitchen Sleep apnea    NO CPAP  . 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 Tubular adenoma of colon    Review of Systems: Review of Systems  Constitutional: Negative for chills, fever and malaise/fatigue.  Eyes: Negative for blurred vision.  Respiratory: Positive for cough and wheezing. Negative for shortness of breath.   Cardiovascular: Negative for chest pain, palpitations and leg swelling.  Gastrointestinal: Positive for heartburn. Negative for constipation, diarrhea, nausea and vomiting.  Neurological: Negative for dizziness and headaches.  All other systems reviewed and are negative.   Physical Exam: Vitals:   04/02/21 1315  BP: 113/81  Pulse: 64  Temp: 98.7 F (37.1 C)  TempSrc: Oral  SpO2: 100%  Weight: 158 lb 8 oz (71.9 kg)  Height: _0  (1.6 m)   Gen: Well-developed, well nourished, NAD HEENT: NCAT head, hearing intact CV: RRR, S1, S2 normal Pulm: Expiratory wheeze with prolonged expiration Extm: ROM intact, Peripheral pulses intact, No peripheral edema Skin: Dry, Warm, normal turgor  Assessment & Plan:   GERD Pt requires refills on medications with associated diagnosis above.  Reviewed disease process and find this medication to be necessary, will not change dose or alter current therapy.  Asthma-COPD overlap syndrome ([place]) KathleenCruz is a 60 yo F w/ PMH of COPD/ASthma presenting to [facility] for follow up management. She was last seen in clinic  and treated with 5 days of azithromycin and prednisone 1 week prior. She mentions that her symptoms have significantly improved although she continues to use her nebulizers 4 times a day. She continues to endorse chest congestion and inquires to whether additional doses of azithomycin may improve her symptoms. She mentions decrease in her dyspnea, sputum production and resolution of her fevers and myalgias.  A/P Present for f/u evaluation after COPD exacerbation. Continues to have diffuse expiratory wheezing but significantly improved compared to prior exam 2 weeks ago. Discussed importance of  adherence to her maintenance inhalers and need to get PFTs now that her exacerbation appears to be over. Also discussed option of pulmonary referral as she is having multiple exacerbations despite being on triple therapy w/ Spiriva and Dulera. KathleenCruz expressed understanding.  - C/w mometasone, formeterol 200-28m 2 puffs BID - C/w tiotropium 174m daily - C/w albuterol neb prn - C/w Claritin - PFTs re-ordered - Pulmonology referral    Patient discussed with Kathleen [name]

## 2021-04-02 NOTE — Assessment & Plan Note (Signed)
Pt requires refills on medications with associated diagnosis above.  Reviewed disease process and find this medication to be necessary, will not change dose or alter current therapy. 

## 2021-04-02 NOTE — Assessment & Plan Note (Addendum)
Kathleen [name] is a 60 yo F w/ PMH of COPD/ASthma presenting to [facility] for follow up management. She was last seen in clinic and treated with 5 days of azithromycin and prednisone 1 week prior. She mentions that her symptoms have significantly improved although she continues to use her nebulizers 4 times a day. She continues to endorse chest congestion and inquires to whether additional doses of azithomycin may improve her symptoms. She mentions decrease in her dyspnea, sputum production and resolution of her fevers and myalgias.  A/P Present for f/u evaluation after COPD exacerbation. Continues to have diffuse expiratory wheezing but significantly improved compared to prior exam 2 weeks ago. Discussed importance of adherence to her maintenance inhalers and need to get PFTs now that her exacerbation appears to be over. Also discussed option of pulmonary referral as she is having multiple exacerbations despite being on triple therapy w/ Spiriva and Dulera. Kathleen [name] expressed understanding.  - C/w mometasone, formeterol 200-2mg  2 puffs BID - C/w tiotropium 64mcg daily - C/w albuterol neb prn - C/w Claritin - PFTs re-ordered - Pulmonology referral

## 2021-04-03 NOTE — Progress Notes (Signed)
Internal Medicine Clinic Attending  Case discussed with Dr. Lee  At the time of the visit.  We reviewed the resident's history and exam and pertinent patient test results.  I agree with the assessment, diagnosis, and plan of care documented in the resident's note.    

## 2021-04-27 ENCOUNTER — Institutional Professional Consult (permissible substitution): Payer: Medicaid Other | Admitting: Pulmonary Disease

## 2021-05-05 ENCOUNTER — Telehealth: Payer: Self-pay

## 2021-05-05 NOTE — Telephone Encounter (Signed)
Return pt's call - states she was bitten by a tick on Friday; she pulled it off w/tweezers. States site is red and swollen; she had used alcohol and peroxide. Sates she's having h/a's and diarrhea - no available appts today. Advise pt to go to UC - states she will.

## 2021-05-05 NOTE — Telephone Encounter (Signed)
Pt states she got bite by tick, requesting an appt for today no appt available.

## 2021-05-15 ENCOUNTER — Emergency Department (HOSPITAL_COMMUNITY)
Admission: EM | Admit: 2021-05-15 | Discharge: 2021-05-15 | Disposition: A | Discharge status: Home / Self Care | Payer: Medicaid Other | Attending: Emergency Medicine | Admitting: Emergency Medicine

## 2021-05-15 ENCOUNTER — Encounter (HOSPITAL_COMMUNITY): Payer: Self-pay

## 2021-05-15 ENCOUNTER — Other Ambulatory Visit: Payer: Self-pay

## 2021-05-15 ENCOUNTER — Emergency Department (HOSPITAL_COMMUNITY): Payer: Medicaid Other

## 2021-05-15 DIAGNOSIS — T782XXA Anaphylactic shock, unspecified, initial encounter: Secondary | ICD-10-CM | POA: Insufficient documentation

## 2021-05-15 DIAGNOSIS — J45909 Unspecified asthma, uncomplicated: Secondary | ICD-10-CM | POA: Insufficient documentation

## 2021-05-15 DIAGNOSIS — Z87891 Personal history of nicotine dependence: Secondary | ICD-10-CM | POA: Insufficient documentation

## 2021-05-15 DIAGNOSIS — T7840XA Allergy, unspecified, initial encounter: Secondary | ICD-10-CM | POA: Diagnosis present

## 2021-05-15 DIAGNOSIS — Z7951 Long term (current) use of inhaled steroids: Secondary | ICD-10-CM | POA: Insufficient documentation

## 2021-05-15 DIAGNOSIS — J441 Chronic obstructive pulmonary disease with (acute) exacerbation: Secondary | ICD-10-CM | POA: Insufficient documentation

## 2021-05-15 DIAGNOSIS — Z7982 Long term (current) use of aspirin: Secondary | ICD-10-CM | POA: Diagnosis not present

## 2021-05-15 LAB — BASIC METABOLIC PANEL
Anion gap: 3 — ABNORMAL LOW (ref 5–15)
BUN: 17 mg/dL (ref 6–20)
CO2: 27 mmol/L (ref 22–32)
Calcium: 8.9 mg/dL (ref 8.9–10.3)
Chloride: 110 mmol/L (ref 98–111)
Creatinine, Ser: 1.12 mg/dL — ABNORMAL HIGH (ref 0.44–1.00)
GFR, Estimated: 56 mL/min — ABNORMAL LOW (ref >60)
Glucose, Bld: 92 mg/dL (ref 70–99)
Potassium: 4 mmol/L (ref 3.5–5.1)
Sodium: 140 mmol/L (ref 135–145)

## 2021-05-15 LAB — CBC WITH DIFFERENTIAL/PLATELET
Abs Immature Granulocytes: 0.03 10*3/uL (ref 0.00–0.07)
Basophils Absolute: 0 10*3/uL (ref 0.0–0.1)
Basophils Relative: 1 %
Eosinophils Absolute: 0.6 10*3/uL — ABNORMAL HIGH (ref 0.0–0.5)
Eosinophils Relative: 8 %
HCT: 45.4 % (ref 36.0–46.0)
Hemoglobin: 14.5 g/dL (ref 12.0–15.0)
Immature Granulocytes: 0 %
Lymphocytes Relative: 30 %
Lymphs Abs: 2.3 10*3/uL (ref 0.7–4.0)
MCH: 28.1 pg (ref 26.0–34.0)
MCHC: 31.9 g/dL (ref 30.0–36.0)
MCV: 88 fL (ref 80.0–100.0)
Monocytes Absolute: 0.6 10*3/uL (ref 0.1–1.0)
Monocytes Relative: 8 %
Neutro Abs: 4 10*3/uL (ref 1.7–7.7)
Neutrophils Relative %: 53 %
Platelets: 333 10*3/uL (ref 150–400)
RBC: 5.16 MIL/uL — ABNORMAL HIGH (ref 3.87–5.11)
RDW: 13.8 % (ref 11.5–15.5)
WBC: 7.6 10*3/uL (ref 4.0–10.5)
nRBC: 0 % (ref 0.0–0.2)

## 2021-05-15 MED ORDER — DIPHENHYDRAMINE HCL 50 MG/ML IJ SOLN: 50.0000 mg | Freq: Once | INTRAMUSCULAR | Status: AC

## 2021-05-15 MED ORDER — EPINEPHRINE 0.3 MG/0.3ML IJ SOAJ: 0.3000 mg | INTRAMUSCULAR | 0 refills | Status: DC | PRN

## 2021-05-15 MED ORDER — EPINEPHRINE 0.3 MG/0.3ML IJ SOAJ: 0.3000 mg | Freq: Once | INTRAMUSCULAR | Status: DC

## 2021-05-15 MED ORDER — METHYLPREDNISOLONE SODIUM SUCC 125 MG IJ SOLR
125.0000 mg | Freq: Once | INTRAMUSCULAR | Status: AC
  Administered 2021-05-15: 125 mg via INTRAVENOUS
  Filled 2021-05-15: qty 2

## 2021-05-15 MED ORDER — FAMOTIDINE IN NACL 20-0.9 MG/50ML-% IV SOLN
20.0000 mg | Freq: Once | INTRAVENOUS | Status: AC
  Administered 2021-05-15: 20 mg via INTRAVENOUS
  Filled 2021-05-15: qty 50

## 2021-05-15 MED ORDER — DIPHENHYDRAMINE HCL 50 MG/ML IJ SOLN
INTRAMUSCULAR | Status: AC
  Administered 2021-05-15: 50 mg via INTRAVENOUS
  Filled 2021-05-15: qty 1

## 2021-05-15 MED ORDER — SODIUM CHLORIDE 0.9 % IV BOLUS
1000.0000 mL | Freq: Once | INTRAVENOUS | Status: AC
  Administered 2021-05-15: 1000 mL via INTRAVENOUS

## 2021-05-15 MED ORDER — PREDNISONE 20 MG PO TABS: 20.0000 mg | ORAL_TABLET | Freq: Two times a day (BID) | ORAL | 0 refills | Status: AC

## 2021-05-15 MED ORDER — EPINEPHRINE 0.3 MG/0.3ML IJ SOAJ
INTRAMUSCULAR | Status: AC
  Administered 2021-05-15: 0.3 mg
  Filled 2021-05-15: qty 0.3

## 2021-05-15 NOTE — ED Notes (Signed)
Gave pt discharge papers, rx sent to pharmacy, advised pt to return for any worsening s/s. Pt understands, steady gait, A&O x 4.

## 2021-05-15 NOTE — ED Provider Notes (Signed)
[place] DEPT Provider Note   CSN: [id number] Arrival date & time: 05/15/21  1134     History Chief Complaint  Patient presents with  . Allergic Reaction    Kathleen [name] is a 60 y.o. female.  Patient presents chief complaint of allergic reaction.  She states that she was stung by a bee which she is allergic to just prior to arrival on her left lower extremity.  Since then she is been having tongue swelling shortness of breath feeling very anxious.  Denies recent illnesses such as fever vomiting cough or diarrhea.  She did not have her EpiPen with her and was not able to administer it prior to arrival but she states that she feels like she needs to.        Past Medical History:  Diagnosis Date  . Anxiety   . Arthritis    hands  . Asthma    2 sets of PFT's in 04/09 without sign variability. Last set with significant decrease in FEV1 with saline alone, suggesting Asthma but  recommended clinical corelation   . Asthma   . Bipolar 1 disorder [facility])    therapist is Caryl Asp and is followed by [facility] [facility] [employer]  . Blackout    negative work up including ESR, ANA, opthalmology referral, carotid dopplers, 2D echo, MRI and EEG.  . BREAST LUMP 03/25/2008   Annotation: bilaterally Qualifier: Diagnosis of  By: Tomasa Hosteller MD, Edmon Crape.   . 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 Bronchitis   . Chronic headache   . Chronic low back pain 08/08/2008   Qualifier: Diagnosis of  By: Redmond Pulling  MD, Mateo Flow    . Chronic pain    normal work up including TSH, RPR, B12, HIV, plain films, 2 ESR's, ANA, CK, RF along with routine CBC, CMET and UA. Further work up includes CRP, ESR, SPEP/UPEP, hepatitis erology, A1C , repeat ANA  . COPD (chronic obstructive pulmonary disease) ([place])   . COPD exacerbation ([place]) 03/31/2019  . Depression   . DUB (dysfunctional uterine bleeding)    and pelvic pain, with negative endometrial bx in  07/09 and transvaginal U/S significant for mild fibroids in 0/09.  . Emphysema of lung ([place])   . GERD (gastroesophageal reflux disease)   . Hallucinations 03/18/2008   Qualifier: Diagnosis of  By: Redmond Pulling  MD, Mateo Flow    . Hyperlipidemia   . Lower extremity edema    Neg ABI's, normal 2D echo, normal albumin  . Menorrhagia   . Ovarian cyst   . Polysubstance abuse ([place])    none since March 17,2009.  Marland Kitchen Sleep apnea    NO CPAP  . 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 Tubular adenoma of colon     Patient Active Problem List   Diagnosis Date Noted  . 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 Daytime somnolence 04/06/2019  . Lumbar radiculopathy 07/26/2016  . Depression 09/19/2015  . Preventative health care 06/18/2014  . Asthma-COPD overlap syndrome ([place]) 05/13/2014  . GERD 01/21/2010  . Sacral back pain 11/27/2008  . Generalized anxiety disorder 03/14/2008  . Migraine variant 03/14/2008    Past Surgical History:  Procedure Laterality Date  . CESAREAN SECTION    . CESAREAN SECTION    . COLONOSCOPY    . HERNIA REPAIR    . Left partial oophorectomy    .  OOPHORECTOMY     1/2 ovary removed  . POLYPECTOMY    . VAGINA SURGERY     mesh     OB History    Gravida  3   Para  2   Term  2   Preterm  0   AB  1   Living  2     SAB  0   IAB  1   Ectopic  0   Multiple      Live Births  2           Family History  Problem Relation Age of Onset  . Colon cancer Mother 63  . Breast cancer Mother 32  . Rectal cancer Mother   . Diabetes Father   . Hypertension Father   . Kidney disease Father   . Colon cancer Father 60  . Prostate cancer Father   . Cancer Father   . Kidney disease Sister   . Cancer Brother   . Breast cancer Maternal Grandmother 103  . Esophageal cancer Neg Hx   . Stomach cancer Neg Hx     Social History   Tobacco Use  . Smoking status: Former Smoker    Packs/day: 0.25    Years: 30.00    Pack years:  7.50    Types: Cigarettes    Quit date: 12/28/2016    Years since quitting: 4.3  . Smokeless tobacco: Never Used  . Tobacco comment: Occ. Wants Nicotine Patches   Vaping Use  . Vaping Use: Never used  Substance Use Topics  . Alcohol use: Yes    Alcohol/week: 0.0 standard drinks    Comment: rarely  . Drug use: Not Currently    Types: Marijuana    Comment: Sometimes.    Home Medications Prior to Admission medications   Medication Sig Start Date End Date Taking? Authorizing Provider  EPINEPHrine 0.3 mg/0.3 mL IJ SOAJ injection Inject 0.3 mg into the muscle as needed for anaphylaxis. 05/15/21  Yes Luna Fuse, MD  predniSONE (DELTASONE) 20 MG tablet Take 1 tablet (20 mg total) by mouth 2 (two) times daily with a meal for 5 days. 05/15/21 05/20/21 Yes Luna Fuse, MD  albuterol (PROVENTIL) (2.5 MG/3ML) 0.083% nebulizer solution Take 3 mLs (2.5 mg total) by nebulization every 4 (four) hours as needed for wheezing or shortness of breath. 04/02/21 04/02/22  Mosetta Anis, MD  ASPIRIN ADULT LOW STRENGTH 81 MG EC tablet TAKE 1 Tablet BY MOUTH ONCE DAILY Patient taking differently: Take 81 mg by mouth daily. 12/03/19   Helberg, Larkin Ina, MD  DULERA 200-5 MCG/ACT AERO Inhale 2 puffs into the lungs every 6 (six) hours. 03/19/21   Mosetta Anis, MD  EPINEPHRINE 0.3 mg/0.3 mL IJ SOAJ injection INJECT CONTENTS OF 1 PEN AS NEEDED FOR ALLERGIC REACTION 03/12/21   Velna Ochs, MD  fluticasone [facility]) 50 MCG/ACT nasal spray Place 1 spray into both nostrils daily. 03/26/21 03/26/22  [name], MD  HYDROcodone-acetaminophen (NORCO/VICODIN) 5-325 MG tablet Take 1 tablet by mouth every 4 (four) hours as needed. 03/17/21   [name], MD  lidocaine (LIDODERM) 5 % Place 1 patch onto the skin daily. Remove & Discard patch within 12 hours or as directed by MD 08/27/20   Jean Rosenthal, MD  loratadine (CLARITIN) 10 MG tablet Take 1 tablet (10 mg total) by mouth daily. 03/26/21   [name], MD  montelukast  (SINGULAIR) 10 MG tablet Take 1 tablet (10 mg total) by mouth at bedtime.  03/19/21   Mosetta Anis, MD  ondansetron (ZOFRAN) 4 MG tablet TAKE 1 TABLET BY MOUTH ONCE DAILY AS NEEDED FOR NAUSEA FOR VOMITING 02/18/21   [name], MD  pantoprazole (PROTONIX) 40 MG tablet Take 1 tablet (40 mg total) by mouth daily. 04/02/21   Mosetta Anis, MD  PROAIR HFA 108 [phone] Base) MCG/ACT inhaler INHALE 1 TO 2 PUFFS BY MOUTH EVERY 6 HOURS AS NEEDED FOR WHEEZING FOR SHORTNESS OF BREATH 03/19/21   Mosetta Anis, MD  tiotropium (SPIRIVA HANDIHALER) 18 MCG inhalation capsule Place 1 capsule (18 mcg total) into inhaler and inhale daily. 03/19/21   Mosetta Anis, MD  divalproex (DEPAKOTE ER) 500 MG 24 hr tablet Take 1 tablet (500 mg total) by mouth 2 (two) times daily. Patient not taking: Reported on 10/08/2019 08/09/19 03/23/20  [name], MD  ipratropium-albuterol (DUONEB) 0.5-2.5 (3) MG/3ML SOLN Take 3 mLs by nebulization every 6 (six) hours as needed (wheezing or shortness of breath). Patient not taking: Reported on 03/19/2020 06/09/19 03/23/20  [name], MD  metoCLOPramide (REGLAN) 5 MG tablet Take 1 tablet (5 mg total) by mouth 3 (three) times daily before meals for 5 days. Patient not taking: Reported on 03/19/2020 07/17/19 03/23/20  [name], MD  SUMAtriptan (IMITREX) 25 MG tablet Take 1 tablet (25 mg total) by mouth every 2 (two) hours as needed for migraine or headache. Patient not taking: Reported on 10/08/2019 07/24/19 03/23/20  [name], DO    Allergies    Bee venom, Peach [prunus persica], Topiramate, and Tramadol  Review of Systems   Review of Systems  Constitutional: Negative for fever.  HENT: Negative for ear pain.   Eyes: Negative for pain.  Respiratory: Positive for shortness of breath. Negative for cough.   Cardiovascular: Negative for chest pain.  Gastrointestinal: Negative for abdominal pain.  Genitourinary: Negative for flank pain.  Musculoskeletal: Negative for  back pain.  Skin: Negative for rash.  Neurological: Negative for headaches.    Physical Exam Updated Vital Signs BP 133/85 (BP Location: Left Arm)   Pulse 88   Resp (!) 22   Ht _0  (1.6 m)   Wt 68 kg   LMP 11/08/2014   SpO2 100%   BMI 26.57 kg/m   Physical Exam Constitutional:      General: She is not in acute distress.    Appearance: Normal appearance.  HENT:     Head: Normocephalic.     Nose: Nose normal.  Eyes:     Extraocular Movements: Extraocular movements intact.  Cardiovascular:     Rate and Rhythm: Normal rate.  Pulmonary:     Effort: Pulmonary effort is normal.  Musculoskeletal:        General: Normal range of motion.     Cervical back: Normal range of motion.  Skin:    Comments: 3 x 4 cm area of erythema and mild tenderness in the left lower extremity.  No residual stinger noted on exam with magnifying glass and tweezers.  Neurological:     General: No focal deficit present.     Mental Status: She is alert. Mental status is at baseline.     ED Results / Procedures / Treatments   Labs (all labs ordered are listed, but only abnormal results are displayed) Labs Reviewed  CBC WITH DIFFERENTIAL/PLATELET - Abnormal; Notable for the following components:      Result Value   RBC 5.16 (*)    Eosinophils Absolute 0.6 (*)  All other components within normal limits  BASIC METABOLIC PANEL - Abnormal; Notable for the following components:   Creatinine, Ser 1.12 (*)    GFR, Estimated 56 (*)    Anion gap 3 (*)    All other components within normal limits    EKG None  Radiology DG Chest Port 1 View  Result Date: 05/15/2021 CLINICAL DATA:  Shortness of breath. EXAM: PORTABLE CHEST 1 VIEW COMPARISON:  March 17, 2021. FINDINGS: The heart size and mediastinal contours are within normal limits. Both lungs are clear. The visualized skeletal structures are unremarkable. IMPRESSION: No active disease. Electronically Signed   By: Marijo Conception M.D.   On: 05/15/2021  13:19    Procedures .Critical Care Performed by: Luna Fuse, MD Authorized by: Luna Fuse, MD   Critical care provider statement:    Critical care time (minutes):  40   Critical care time was exclusive of:  Separately billable procedures and treating other patients Comments:     Anaphylactic reaction     Medications Ordered in ED Medications  EPINEPHrine (EPI-PEN) injection 0.3 mg (0 mg Intramuscular Hold 05/15/21 1221)  sodium chloride 0.9 % bolus 1,000 mL (1,000 mLs Intravenous New Bag/Given 05/15/21 1158)  diphenhydrAMINE (BENADRYL) injection 50 mg (50 mg Intravenous Given 05/15/21 1149)  methylPREDNISolone sodium succinate (SOLU-MEDROL) 125 mg/2 mL injection 125 mg (125 mg Intravenous Given 05/15/21 1150)  famotidine (PEPCID) IVPB 20 mg premix (20 mg Intravenous New Bag/Given 05/15/21 1159)  EPINEPHrine (EPI-PEN) 0.3 mg/0.3 mL injection (0.3 mg  Given 05/15/21 1149)    ED Course  I have reviewed the triage vital signs and the nursing notes.  Pertinent labs & imaging results that were available during my care of the patient were reviewed by me and considered in my medical decision making (see chart for details).    MDM Rules/Calculators/A&P                          I was immediately called to her bedside by nursing team due to concern for acute anaphylactic reaction.  Labs otherwise unremarkable patient improved with epi IM and Benadryl and Pepcid Solu-Medrol which was provided.  She was observed in the ER for several hours with significant improvement with no symptoms at this time.  Patient insisting on chest x-ray which was performed and unremarkable.    Will be discharged home in stable condition, advised follow-up with her doctors in 3 to 4 days.  Advised immediate return for worsening symptoms fevers cough or any additional concerns.  Final Clinical Impression(s) / ED Diagnoses Final diagnoses:  Anaphylaxis, initial encounter    Rx / DC Orders ED Discharge Orders          Ordered    predniSONE (DELTASONE) 20 MG tablet  2 times daily with meals        05/15/21 1334    EPINEPHrine 0.3 mg/0.3 mL IJ SOAJ injection  As needed        05/15/21 1334           Luna Fuse, MD 05/15/21 1334

## 2021-05-15 NOTE — ED Triage Notes (Signed)
Pt was stung by wasp on left lower leg/ankle, has allergy to bee venom, did not have epi pen in purse, c/o pain in leg, chest pain and has mild wheezing. C/o "weird feeling in top of mouth" O2 is 100

## 2021-05-15 NOTE — ED Notes (Signed)
Pt ambulated to restroom independently, steady gait

## 2021-05-15 NOTE — Discharge Instructions (Signed)
Call your primary care doctor or specialist as discussed in the next 2-3 days.   Return immediately back to the ER if:  Your symptoms worsen within the next 12-24 hours. You develop new symptoms such as new fevers, persistent vomiting, new pain, shortness of breath, or new weakness or numbness, or if you have any other concerns.  

## 2021-05-20 ENCOUNTER — Other Ambulatory Visit: Payer: Self-pay

## 2021-05-20 ENCOUNTER — Ambulatory Visit (INDEPENDENT_AMBULATORY_CARE_PROVIDER_SITE_OTHER): Payer: Medicaid Other | Admitting: Pulmonary Disease

## 2021-05-20 ENCOUNTER — Encounter: Payer: Self-pay | Admitting: Pulmonary Disease

## 2021-05-20 VITALS — BP 112/78 | HR 85 | Ht 63.0 in | Wt 157.0 lb

## 2021-05-20 DIAGNOSIS — J4489 Other specified chronic obstructive pulmonary disease: Secondary | ICD-10-CM

## 2021-05-20 DIAGNOSIS — J4540 Moderate persistent asthma, uncomplicated: Secondary | ICD-10-CM | POA: Diagnosis not present

## 2021-05-20 DIAGNOSIS — J449 Chronic obstructive pulmonary disease, unspecified: Secondary | ICD-10-CM

## 2021-05-20 DIAGNOSIS — R0600 Dyspnea, unspecified: Secondary | ICD-10-CM

## 2021-05-20 DIAGNOSIS — R0609 Other forms of dyspnea: Secondary | ICD-10-CM

## 2021-05-20 MED ORDER — SPIRIVA HANDIHALER 18 MCG IN CAPS: 18.0000 ug | ORAL_CAPSULE | Freq: Every day | RESPIRATORY_TRACT | 11 refills | Status: DC

## 2021-05-20 MED ORDER — PROAIR HFA 108 (90 BASE) MCG/ACT IN AERS: INHALATION_SPRAY | RESPIRATORY_TRACT | 11 refills | Status: DC

## 2021-05-20 MED ORDER — EPINEPHRINE 0.3 MG/0.3ML IJ SOAJ
0.3000 mg | INTRAMUSCULAR | 3 refills | Status: DC | PRN
Start: 2021-05-20 — End: 2021-05-27

## 2021-05-20 MED ORDER — DULERA 200-5 MCG/ACT IN AERO: 2.0000 | INHALATION_SPRAY | Freq: Two times a day (BID) | RESPIRATORY_TRACT | 11 refills | Status: DC

## 2021-05-20 NOTE — Progress Notes (Signed)
_0  ID: Kathleen [name], female    DOB: [birth year], 60 y.o.   MRN: [id number]  Chief Complaint  Patient presents with  . Consult    Asthma/copd, multiple ER visits    Referring provider: Aldine Contes, MD  HPI:   60 year old whom we are seeing in consultation for evaluation of dyspnea exertion, poorly controlled asthma/COPD after multiple ER visits.  ER note x3 reviewed.  Patient has longstanding history of dyspnea exertion, wheeze, chest tightness.  Worse with temperature changes, hot and cold.  Unclear if seasonal changes,, allergies, pollens contribute.  She seemed to indicate breathing does tend to get worse when her allergy symptoms flare.  She has significant atopic symptoms including anaphylaxis to bee stings.  Breaks out in with the sound like hives or eczema to multiple different triggers.  No time during day when her symptoms dyspnea is better or worse.  Uses Dulera which seems to be the most effective inhaled agent for her symptoms.  In addition she takes [place] daily, Qvar, Symbicort, Anoro and uses as needed albuterol.  Mild cough.  No environmental changes that she can identify the triggers breathing elsewise.  No other alleviating or exacerbating factors.  3 most recent chest x-rays personally reviewed and all interpreted as clear lungs, no effusion, pneumothorax, infiltrate.  Overall look the same.  PMH: Asthma, hyperlipidemia, seasonal allergies, headaches, sleep apnea Surgical history: Reviewed with patient, denies any Family history: First relatives with emphysema, seasonal allergies, asthma, cancer Social history: Lives in [place], former cigarette smoking, approximately 40-pack-year history, used to smoke marijuana but quit several months ago  Licensed conveyancer / Pulmonary Flowsheets:   ACT:  No flowsheet data found.  MMRC: No flowsheet data found.  Epworth:  No flowsheet data found.  Tests:   FENO:  No results found for: NITRICOXIDE  PFT: No  flowsheet data found.  WALK:  No flowsheet data found.  Imaging: Personally reviewed and as per EMR discussion this note DG Chest Port 1 View  Result Date: 05/15/2021 CLINICAL DATA:  Shortness of breath. EXAM: PORTABLE CHEST 1 VIEW COMPARISON:  March 17, 2021. FINDINGS: The heart size and mediastinal contours are within normal limits. Both lungs are clear. The visualized skeletal structures are unremarkable. IMPRESSION: No active disease. Electronically Signed   By: Marijo Conception M.D.   On: 05/15/2021 13:19    Lab Results: Personally reviewed, chronically elevated eosinophils as high as 1.5K but often in the several hundred range dating back to 2012 CBC    Component Value Date/Time   WBC 7.6 05/15/2021 1145   RBC 5.16 (H) 05/15/2021 1145   HGB 14.5 05/15/2021 1145   HGB 13.6 08/09/2019 1538   HCT 45.4 05/15/2021 1145   HCT 41.5 08/09/2019 1538   PLT 333 05/15/2021 1145   PLT 355 08/09/2019 1538   MCV 88.0 05/15/2021 1145   MCV 86 08/09/2019 1538   MCH 28.1 05/15/2021 1145   MCHC 31.9 05/15/2021 1145   RDW 13.8 05/15/2021 1145   RDW 12.5 08/09/2019 1538   LYMPHSABS 2.3 05/15/2021 1145   LYMPHSABS 2.9 08/09/2019 1538   MONOABS 0.6 05/15/2021 1145   EOSABS 0.6 (H) 05/15/2021 1145   EOSABS 0.2 08/09/2019 1538   BASOSABS 0.0 05/15/2021 1145   BASOSABS 0.1 08/09/2019 1538    BMET    Component Value Date/Time   NA 140 05/15/2021 1145   NA 141 08/09/2019 1538   K 4.0 05/15/2021 1145   CL 110 05/15/2021 1145   CO2 27 05/15/2021  1145   GLUCOSE 92 05/15/2021 1145   BUN 17 05/15/2021 1145   BUN 14 08/09/2019 1538   CREATININE 1.12 (H) 05/15/2021 1145   CREATININE 1.01 05/24/2014 1140   CALCIUM 8.9 05/15/2021 1145   GFRNONAA 56 (L) 05/15/2021 1145   GFRNONAA 64 05/24/2014 1140   GFRAA >60 08/14/2020 1102   GFRAA 73 05/24/2014 1140    BNP    Component Value Date/Time   BNP 28.2 08/14/2020 1102    ProBNP    Component Value Date/Time   PROBNP 69.0 04/29/2008 1933     Specialty Problems      Pulmonary Problems   Asthma-COPD overlap syndrome (HCC)      Allergies  Allergen Reactions  . Bee Venom   . Peach [Prunus Persica] Other (See Comments)    Throat swells , breakout  . Topiramate Other (See Comments)    Reaction:  Hallucinations   . Tramadol Nausea Only    Immunization History  Administered Date(s) Administered  . Influenza Whole 10/08/2008, 11/25/2009  . PFIZER(Purple Top)SARS-COV-2 Vaccination 03/20/2020, 04/10/2020  . Pneumococcal Polysaccharide-23 11/25/2009  . Td 11/25/2009  . Tdap 07/08/2016    Past Medical History:  Diagnosis Date  . Anxiety   . Arthritis    hands  . Asthma    2 sets of PFT's in 04/09 without sign variability. Last set with significant decrease in FEV1 with saline alone, suggesting Asthma but  recommended clinical corelation   . Asthma   . Bipolar 1 disorder [facility])    therapist is Caryl Asp and is followed by [facility] [facility]  . Blackout    negative work up including ESR, ANA, opthalmology referral, carotid dopplers, 2D echo, MRI and EEG.  . BREAST LUMP 03/25/2008   Annotation: bilaterally Qualifier: Diagnosis of  By: Tomasa Hosteller MD, Edmon Crape.   . 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 Bronchitis   . Chronic headache   . Chronic low back pain 08/08/2008   Qualifier: Diagnosis of  By: Redmond Pulling  MD, Mateo Flow    . Chronic pain    normal work up including TSH, RPR, B12, HIV, plain films, 2 ESR's, ANA, CK, RF along with routine CBC, CMET and UA. Further work up includes CRP, ESR, SPEP/UPEP, hepatitis erology, A1C , repeat ANA  . COPD (chronic obstructive pulmonary disease) ([place])   . COPD exacerbation ([place]) 03/31/2019  . Depression   . DUB (dysfunctional uterine bleeding)    and pelvic pain, with negative endometrial bx in 07/09 and transvaginal U/S significant for mild fibroids in 0/09.  . Emphysema of lung ([place])   . GERD (gastroesophageal reflux disease)   .  Hallucinations 03/18/2008   Qualifier: Diagnosis of  By: Redmond Pulling  MD, Mateo Flow    . Hyperlipidemia   . Lower extremity edema    Neg ABI's, normal 2D echo, normal albumin  . Menorrhagia   . Ovarian cyst   . Polysubstance abuse ([place])    none since March 17,2009.  Marland Kitchen Sleep apnea    NO CPAP  . 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 Tubular adenoma of colon     Tobacco History: Social History   Tobacco Use  Smoking Status Former Smoker  . Packs/day: 0.25  . Years: 30.00  . Pack years: 7.50  . Types: Cigarettes  . Quit date: 12/28/2016  . Years since quitting: 4.3  Smokeless Tobacco Never  Used  Tobacco Comment   Occ. Wants Nicotine Patches    Counseling given: Not Answered Comment: Occ. Wants Nicotine Patches    Continue to not smoke  Outpatient Encounter Medications as of 05/20/2021  Medication Sig  . fluticasone (FLONASE) 50 MCG/ACT nasal spray Place 1 spray into both nostrils daily.  Marland Kitchen HYDROcodone-acetaminophen (NORCO/VICODIN) 5-325 MG tablet Take 1 tablet by mouth every 4 (four) hours as needed.  . lidocaine (LIDODERM) 5 % Place 1 patch onto the skin daily. Remove & Discard patch within 12 hours or as directed by MD  . loratadine (CLARITIN) 10 MG tablet Take 1 tablet (10 mg total) by mouth daily.  . montelukast (SINGULAIR) 10 MG tablet Take 1 tablet (10 mg total) by mouth at bedtime.  . ondansetron (ZOFRAN) 4 MG tablet TAKE 1 TABLET BY MOUTH ONCE DAILY AS NEEDED FOR NAUSEA FOR VOMITING  . pantoprazole (PROTONIX) 40 MG tablet Take 1 tablet (40 mg total) by mouth daily.  . predniSONE (DELTASONE) 20 MG tablet Take 1 tablet (20 mg total) by mouth 2 (two) times daily with a meal for 5 days.  . [DISCONTINUED] EPINEPHRINE 0.3 mg/0.3 mL IJ SOAJ injection INJECT CONTENTS OF 1 PEN AS NEEDED FOR ALLERGIC REACTION  . [DISCONTINUED] EPINEPHrine 0.3 mg/0.3 mL IJ SOAJ injection Inject 0.3 mg into the muscle as needed for anaphylaxis.  . [DISCONTINUED]  PROAIR HFA 108 (90 Base) MCG/ACT inhaler INHALE 1 TO 2 PUFFS BY MOUTH EVERY 6 HOURS AS NEEDED FOR WHEEZING FOR SHORTNESS OF BREATH  . [DISCONTINUED] tiotropium (SPIRIVA HANDIHALER) 18 MCG inhalation capsule Place 1 capsule (18 mcg total) into inhaler and inhale daily.  Marland Kitchen albuterol (PROVENTIL) (2.5 MG/3ML) 0.083% nebulizer solution Take 3 mLs (2.5 mg total) by nebulization every 4 (four) hours as needed for wheezing or shortness of breath.  . ASPIRIN ADULT LOW STRENGTH 81 MG EC tablet TAKE 1 Tablet BY MOUTH ONCE DAILY (Patient taking differently: Take 81 mg by mouth daily.)  . DULERA 200-5 MCG/ACT AERO Inhale 2 puffs into the lungs in the morning and at bedtime.  Marland Kitchen EPINEPHrine 0.3 mg/0.3 mL IJ SOAJ injection Inject 0.3 mg into the muscle as needed for anaphylaxis.  Marland Kitchen PROAIR HFA 108 (90 Base) MCG/ACT inhaler INHALE 1 TO 2 PUFFS BY MOUTH EVERY 6 HOURS AS NEEDED FOR WHEEZING FOR SHORTNESS OF BREATH  . tiotropium (SPIRIVA HANDIHALER) 18 MCG inhalation capsule Place 1 capsule (18 mcg total) into inhaler and inhale daily.  . [DISCONTINUED] divalproex (DEPAKOTE ER) 500 MG 24 hr tablet Take 1 tablet (500 mg total) by mouth 2 (two) times daily. (Patient not taking: Reported on 10/08/2019)  . [DISCONTINUED] DULERA 200-5 MCG/ACT AERO Inhale 2 puffs into the lungs every 6 (six) hours.  . [DISCONTINUED] ipratropium-albuterol (DUONEB) 0.5-2.5 (3) MG/3ML SOLN Take 3 mLs by nebulization every 6 (six) hours as needed (wheezing or shortness of breath). (Patient not taking: Reported on 03/19/2020)  . [DISCONTINUED] metoCLOPramide (REGLAN) 5 MG tablet Take 1 tablet (5 mg total) by mouth 3 (three) times daily before meals for 5 days. (Patient not taking: Reported on 03/19/2020)  . [DISCONTINUED] SUMAtriptan (IMITREX) 25 MG tablet Take 1 tablet (25 mg total) by mouth every 2 (two) hours as needed for migraine or headache. (Patient not taking: Reported on 10/08/2019)   No facility-administered encounter medications on file as of  05/20/2021.     Review of Systems  Review of Systems  No chest pain with exertion.  No orthopnea or PND.  No lower extremity swelling.  Comprehensive review of systems otherwise negative. Physical Exam  BP 112/78   Pulse 85   Ht _0  (1.6 m)   Wt 157 lb (71.2 kg)   LMP 11/08/2014   SpO2 96%   BMI 27.81 kg/m   Wt Readings from Last 5 Encounters:  05/20/21 157 lb (71.2 kg)  05/15/21 150 lb (68 kg)  04/02/21 158 lb 8 oz (71.9 kg)  03/24/21 157 lb 9.6 oz (71.5 kg)  03/19/21 159 lb 6.4 oz (72.3 kg)    BMI Readings from Last 5 Encounters:  05/20/21 27.81 kg/m  05/15/21 26.57 kg/m  04/02/21 28.08 kg/m  03/24/21 27.92 kg/m  03/19/21 28.24 kg/m     Physical Exam General: Well-appearing, in Eyes: EOMI, no icterus Neck: Supple, no JVP Cardiovascular: Regular rhythm, no murmur Pulmonary: Clear to auscultation bilaterally, no wheeze, normal work of breathing Abdomen: Nondistended, bowel sounds present MSK: No synovitis, no joint effusion Neuro: Normal gait, no weakness Psych: Normal mood, full affect   Assessment & Plan:   Dyspnea on exertion: Suspect related to COPD and asthma overlap.  Highest concern for uncontrolled asthma given her significant atopic and allergic symptoms.  PFTs for further evaluation.  COPD/asthma overlap: FEV1/FVC ratio borderline at 70% in 2009.  Poorly controlled symptoms requiring multiple rounds of steroids, at least 2 or 3 just in 2022 alone.  A lot of confusion regarding her inhaled medication, she is on multiple duplicates.  Stop Anoro Qvar and Symbicort.  Continue Dulera and Spiriva as prescribed for triple inhaled therapy.  Continue ProAir as needed.  Scripts for all of these refilled today.  She has chronically elevated eosinophils.  Suspect she will need biologic therapy.  IgE and RAST panel ordered today to complete work-up and help determine most appropriate biologic medication.  Esophageal stasis/dysmotility: At risk for reflux,  microaspiration which can contribute to poorly controlled asthma.   Return in about 3 months (around 08/20/2021).   Lanier Clam, MD 05/20/2021

## 2021-05-20 NOTE — Patient Instructions (Addendum)
Continue to use the Proair every 4-6 hours AS NEEDED  Contiue the Dulear twice a day  Continue the Spiriva once a day  Put away the Qvar and the Anoro and the Symbicort  We will get lab work today.  We will get breathing or pulmonary function tests in the coming days.  We may need to use injectables to treat the asthma. We can make that decision in the near future.   Follow up with Dr. Silas Flood in 3 months or sooner as needed

## 2021-05-27 ENCOUNTER — Other Ambulatory Visit: Payer: Self-pay

## 2021-05-27 ENCOUNTER — Ambulatory Visit (HOSPITAL_COMMUNITY)
Admission: RE | Admit: 2021-05-27 | Discharge: 2021-05-27 | Disposition: A | Discharge status: Home / Self Care | Payer: Medicaid Other | Source: Ambulatory Visit | Attending: Internal Medicine | Admitting: Internal Medicine

## 2021-05-27 ENCOUNTER — Ambulatory Visit (INDEPENDENT_AMBULATORY_CARE_PROVIDER_SITE_OTHER): Payer: Medicaid Other | Admitting: Student

## 2021-05-27 ENCOUNTER — Encounter: Payer: Self-pay | Admitting: Student

## 2021-05-27 VITALS — BP 91/73 | HR 88 | Temp 98.6°F | Ht 63.0 in | Wt 155.0 lb

## 2021-05-27 DIAGNOSIS — J4540 Moderate persistent asthma, uncomplicated: Secondary | ICD-10-CM | POA: Diagnosis not present

## 2021-05-27 DIAGNOSIS — M533 Sacrococcygeal disorders, not elsewhere classified: Secondary | ICD-10-CM | POA: Diagnosis present

## 2021-05-27 DIAGNOSIS — Z91030 Bee allergy status: Secondary | ICD-10-CM

## 2021-05-27 DIAGNOSIS — M62838 Other muscle spasm: Secondary | ICD-10-CM | POA: Diagnosis present

## 2021-05-27 DIAGNOSIS — M545 Low back pain, unspecified: Secondary | ICD-10-CM | POA: Diagnosis not present

## 2021-05-27 MED ORDER — LIDOCAINE 5 % EX PTCH: 1.0000 | MEDICATED_PATCH | CUTANEOUS | 1 refills | Status: DC

## 2021-05-27 MED ORDER — EPINEPHRINE 0.3 MG/0.3ML IJ SOAJ: 0.3000 mg | INTRAMUSCULAR | 3 refills | Status: DC | PRN

## 2021-05-27 MED ORDER — CYCLOBENZAPRINE HCL 5 MG PO TABS: 5.0000 mg | ORAL_TABLET | Freq: Three times a day (TID) | ORAL | 0 refills | Status: AC | PRN

## 2021-05-27 NOTE — Patient Instructions (Signed)
Ms.Sydnee JOYEL CHENETTE, it was a pleasure seeing you today!  I have ordered the following labs today:  Lab Orders  No laboratory test(s) ordered today     Tests ordered today:  LUMBAR SPINE XR  Referrals ordered today:   Referral Orders  No referral(s) requested today     I have ordered the following medication/changed the following medications:   Stop the following medications: Medications Discontinued During This Encounter  Medication Reason   lidocaine (LIDODERM) 5 % Reorder   EPINEPHrine 0.3 mg/0.3 mL IJ SOAJ injection Reorder     Start the following medications: Meds ordered this encounter  Medications   lidocaine (LIDODERM) 5 %    Sig: Place 1 patch onto the skin daily. Remove & Discard patch within 12 hours or as directed by MD    Dispense:  30 patch    Refill:  1   EPINEPHrine 0.3 mg/0.3 mL IJ SOAJ injection    Sig: Inject 0.3 mg into the muscle as needed for anaphylaxis.    Dispense:  1 each    Refill:  3   cyclobenzaprine (FLEXERIL) 5 MG tablet    Sig: Take 1 tablet (5 mg total) by mouth 3 (three) times daily as needed for up to 10 days for muscle spasms.    Dispense:  30 tablet    Refill:  0     Follow-up:  6 months     Today we discussed: -Back pain: We will image your back and give you a course of muscle relaxers. We will call you with the results of the image. In the meantime, you can take Tylenol and ibuprofen as well as ice/warm compress.  Please make sure to arrive 15 minutes prior to your next appointment. If you arrive late, you may be asked to reschedule.   We look forward to seeing you next time. Please call our clinic at [phone] if you have any questions or concerns. The best time to call is Monday-Friday from 9am-4pm, but there is someone available 24/7. If after hours or the weekend, call the main hospital number and ask for the Internal Medicine Resident On-Call. If you need medication refills, please notify your pharmacy one week in advance  and they will send [place] a request.  Thank you for letting us take part in your care. Wishing you the best!  Thank you, [name], MD

## 2021-05-28 ENCOUNTER — Telehealth: Payer: Self-pay

## 2021-05-28 DIAGNOSIS — M545 Low back pain, unspecified: Secondary | ICD-10-CM | POA: Insufficient documentation

## 2021-05-28 LAB — ALLERGEN PROFILE, PERENNIAL ALLERGEN IGE
Alternaria Alternata IgE: <0.1 kU/L
Aspergillus Fumigatus IgE: <0.1 kU/L
Aureobasidi Pullulans IgE: <0.1 kU/L
Candida Albicans IgE: 0.14 kU/L — AB
Cat Dander IgE: <0.1 kU/L
Chicken Feathers IgE: <0.1 kU/L
Cladosporium Herbarum IgE: <0.1 kU/L
Cow Dander IgE: 0.1 kU/L — AB
D Farinae IgE: 0.19 kU/L — AB
D Pteronyssinus IgE: 0.2 kU/L — AB
Dog Dander IgE: <0.1 kU/L
Duck Feathers IgE: <0.1 kU/L
Goose Feathers IgE: <0.1 kU/L
Mouse Urine IgE: <0.1 kU/L
Mucor Racemosus IgE: <0.1 kU/L
Penicillium Chrysogen IgE: <0.1 kU/L
Phoma Betae IgE: <0.1 kU/L
Setomelanomma Rostrat: <0.1 kU/L
Stemphylium Herbarum IgE: <0.1 kU/L

## 2021-05-28 NOTE — Telephone Encounter (Signed)
PT  is requesting a call back about her xray results   from her Last OV

## 2021-05-28 NOTE — Progress Notes (Signed)
   CC: back pain  HPI:  Ms.Cashlynn Wanda Plump Kalp is a 60 y.o. with medical history as below presenting to [facility] for back pain.  Please see problem-based list for further details, assessments, and plans.  Past Medical History:  Diagnosis Date   Anxiety    Arthritis    hands   Asthma    2 sets of PFT's in 04/09 without sign variability. Last set with significant decrease in FEV1 with saline alone, suggesting Asthma but  recommended clinical corelation    Asthma    Bipolar 1 disorder [facility])    therapist is Joy and is followed by [facility]   Blackout    negative work up including ESR, ANA, opthalmology referral, carotid dopplers, 2D echo, MRI and EEG.   BREAST LUMP 03/25/2008   Annotation: bilaterally Qualifier: Diagnosis of  By: Tomasa Hosteller MD, Edmon Crape.    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 Further work up includes CRP, ESR, SPEP/UPEP, hepatitis erology, A1C , repeat ANA   COPD (chronic obstructive pulmonary disease) ([place])    COPD exacerbation ([place]) 03/31/2019   Depression    DUB (dysfunctional uterine bleeding)    and pelvic pain, with negative endometrial bx in 07/09 and transvaginal U/S significant for mild fibroids in 0/09.   Emphysema of lung ([place])    GERD (gastroesophageal reflux disease)    Hallucinations 03/18/2008   Qualifier: Diagnosis of  By: Redmond Pulling  MD, Mateo Flow     Hyperlipidemia    Lower extremity edema    Neg ABI's, normal 2D echo, normal albumin   Menorrhagia    Ovarian cyst    Polysubstance abuse ([place])    none since March 17,2009.   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 No acute distress CV: Regular rate, rhythm. No murmurs, rubs, gallops appreciated. Pulm: Normal work of breathing on room air. MSK: Lumbar bony tenderness, lumbar paraspinal tenderness. Normal bulk, tone. ROM of hips in tact.  Neuro: Awake, alert, no focal deficits. Motor 5/5, sensation in tact bilateral lower extremities.   Assessment & Plan:   See Encounters Tab for problem based charting.  Patient discussed with Dr. Evette Doffing

## 2021-05-28 NOTE — Assessment & Plan Note (Signed)
Kathleen [name] recently experienced two wasp/yellow jacket stings on her right ankle. At the time she experienced tongue swelling and shortness of breath. She was taken to the Emergency Department where she was treated for anaphylactic reaction. Since then Kathleen [name] reports her symptoms have resolved. - Refill for EpiPen

## 2021-05-28 NOTE — Assessment & Plan Note (Addendum)
Ms. Espey reports that she has a long history of balance issues and falls. Today she is reporting that she experienced a fall a couple of days ago. Describes stepping backwards and tripping over her dog, which subsequently led her to falling on her buttocks. Since then she reports lumbar pain that has not been relieved with over the counter medications. She is also experiencing some intermittent muscle cramping in her lumbar region. Denies radiation of pain, fevers, urinary/bowel incontinence, or other red flags. Ms. Galindez does mention that she has chronic back pain, but this pain is different since her fall.  On exam, there is bony and muscular tenderness in her lumbar region. She is neurologically in tact in her lower extremities. Ms. Inthavong is requesting pain medication for her symptoms. Discussed that back pain is best treated with conservative management, including exercise, NSAIDs, and ice/warm compress. Will obtain lumbar XR for further evaluation and prescribe short-course of muscle relaxer for symptom relief. - Lumbar XR today - Flexeril 5mg  three times daily as needed for ten days - Lidocaine patch  - Ice/warm compress

## 2021-05-28 NOTE — Progress Notes (Signed)
Internal Medicine Clinic Attending ? ?Case discussed with Dr. Braswell  At the time of the visit.  We reviewed the resident?s history and exam and pertinent patient test results.  I agree with the assessment, diagnosis, and plan of care documented in the resident?s note.  ?

## 2021-05-29 LAB — SPECIMEN STATUS REPORT

## 2021-05-29 LAB — IGE: IgE (Immunoglobulin E), Serum: 2682 IU/mL — ABNORMAL HIGH (ref 6–495)

## 2021-05-29 NOTE — Telephone Encounter (Signed)
Requesting x-ray results, please call back.

## 2021-05-29 NOTE — Telephone Encounter (Signed)
Please call pt back for X-ray results.

## 2021-06-01 ENCOUNTER — Other Ambulatory Visit: Payer: Self-pay | Admitting: Student

## 2021-06-01 DIAGNOSIS — M545 Low back pain, unspecified: Secondary | ICD-10-CM

## 2021-06-01 NOTE — Telephone Encounter (Signed)
Discussed result with patient. I have placed a referral for physical therapy. Kathleen [name] inquired about further pain medication. We discussed trialing conservative management with physical therapy prior to escalating medications. Patient verbalized understanding.

## 2021-06-01 NOTE — Telephone Encounter (Signed)
Pt rtn call a bout her X- Ray results.

## 2021-06-13 IMAGING — CR DG FOOT COMPLETE 3+V*L*
3 series · 3 of 3 positions shown · non-contrast
Comparison: None.

CLINICAL DATA: 58-year-old female with trauma to the left foot.

EXAM:
LEFT FOOT - COMPLETE 3+ VIEW

[x foot ap left (1 of 2)]
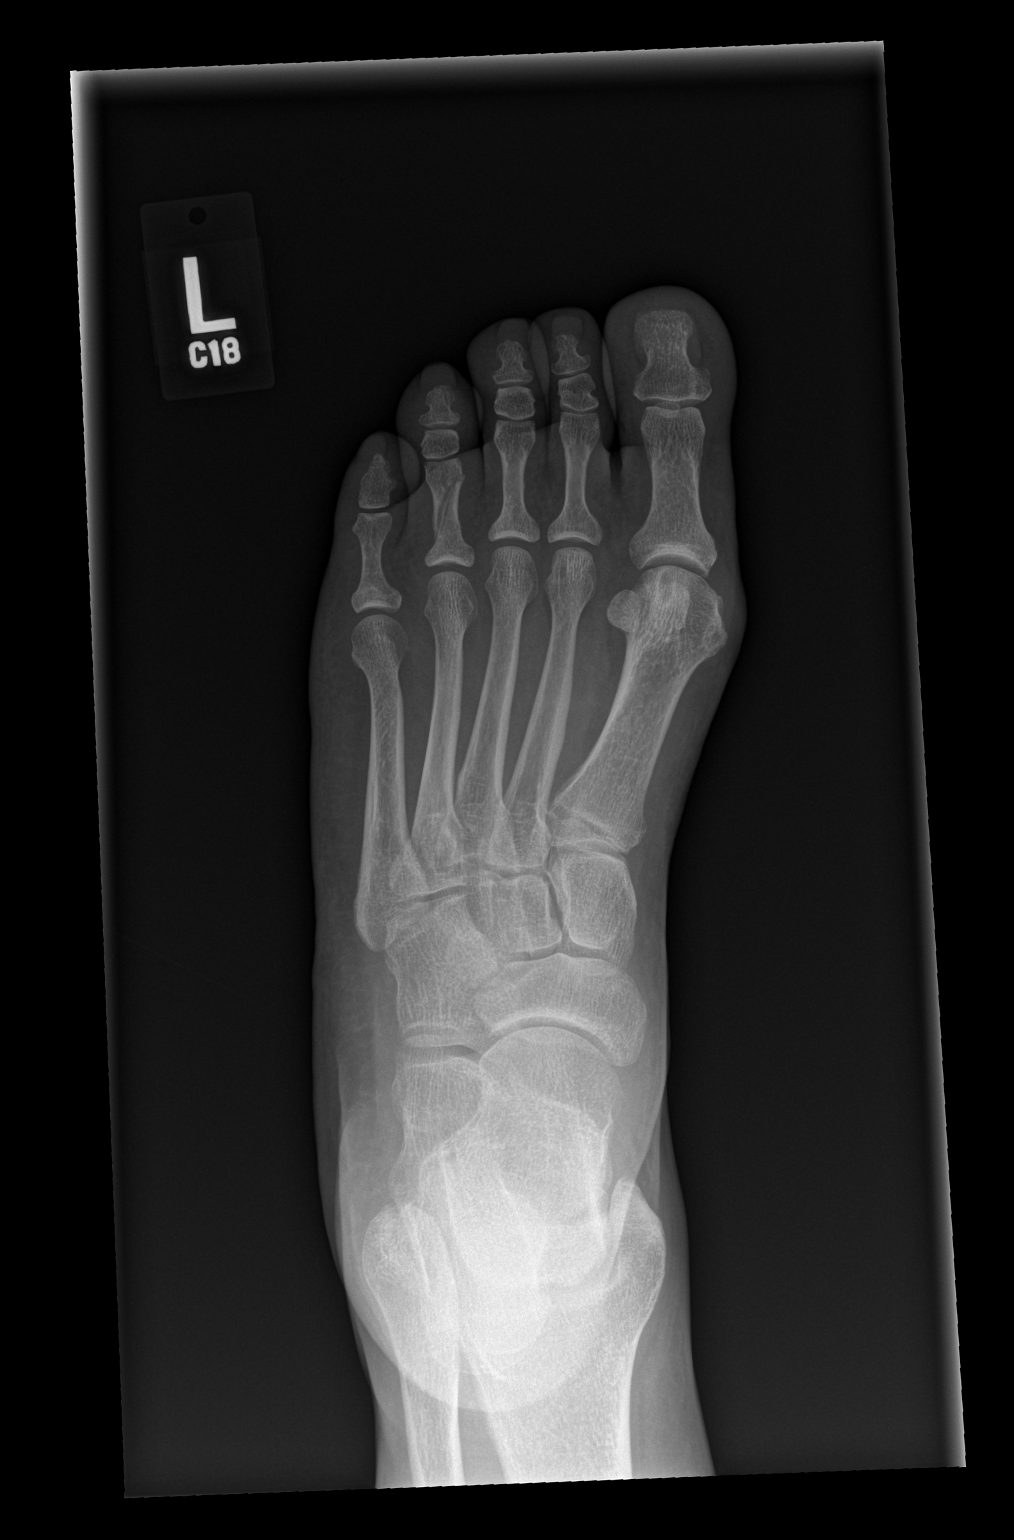

[x foot ap left (2 of 2)]
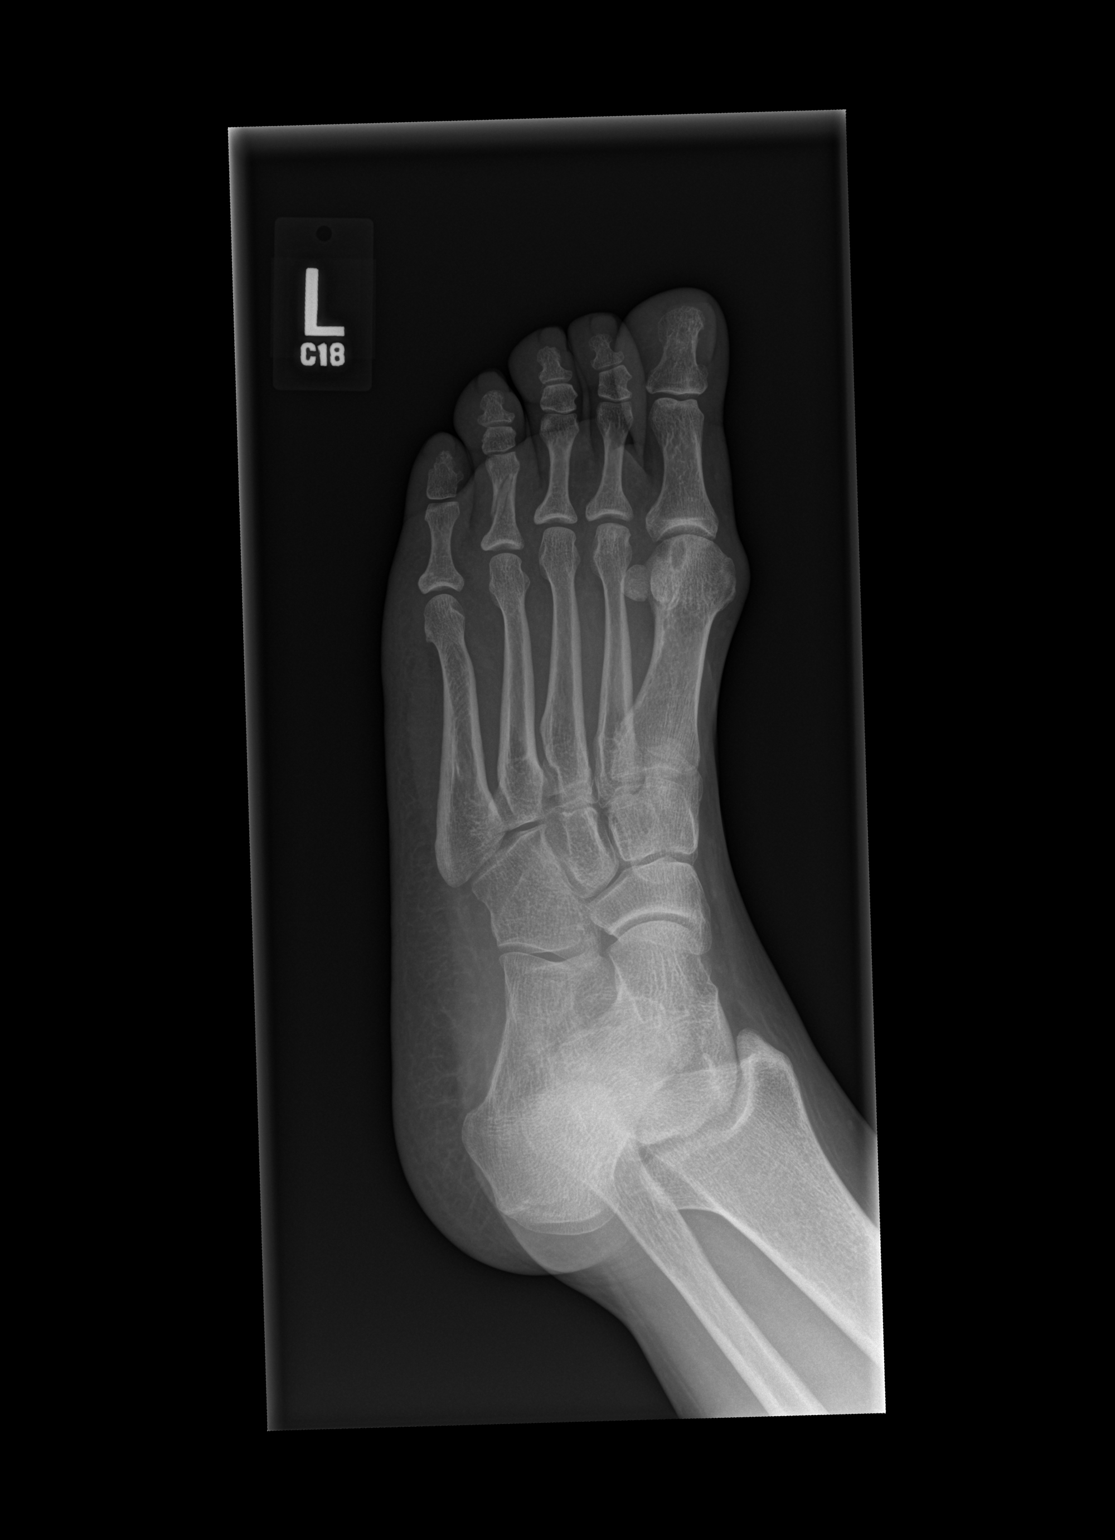

[x foot lat left]
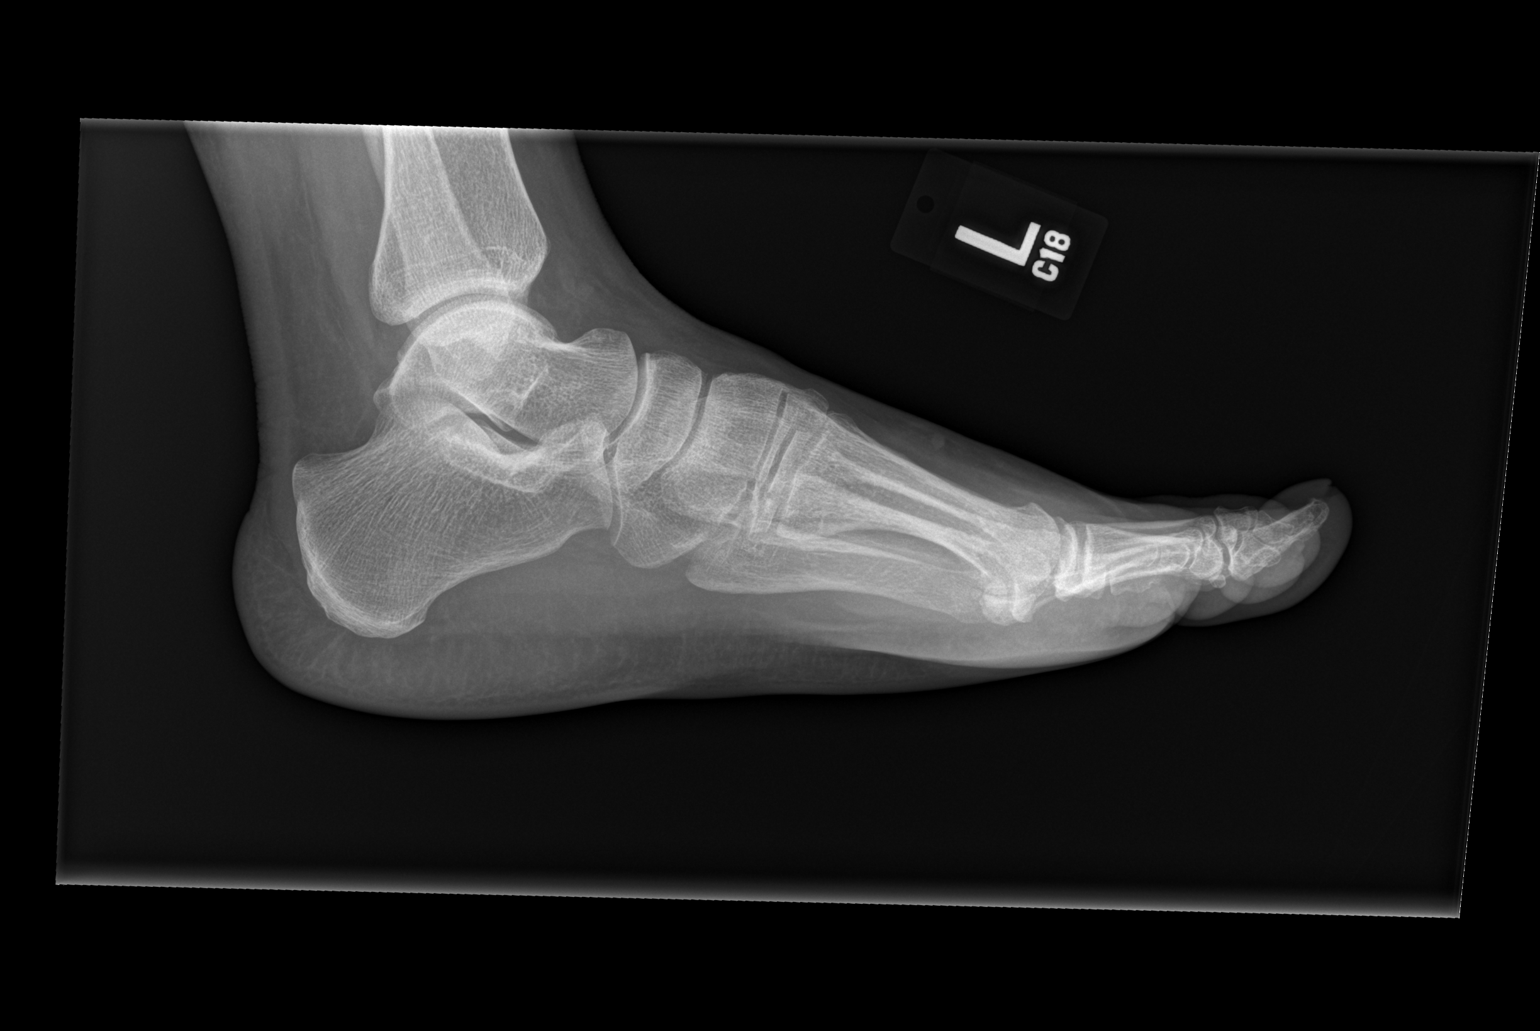

[3 of 3 positions shown; findings below may reference images not displayed]

FINDINGS: There is a nondisplaced oblique fracture of the proximal phalanx of
the fourth digit. No other acute fracture identified. The bones are
well mineralized. No arthritic changes. The soft tissues are
unremarkable. No radiopaque foreign object or soft tissue gas.
IMPRESSION: Nondisplaced oblique fracture of the proximal phalanx of the fourth
digit.

## 2021-06-17 ENCOUNTER — Ambulatory Visit: Payer: Medicaid Other | Attending: Internal Medicine | Admitting: Physical Therapy

## 2021-06-17 ENCOUNTER — Other Ambulatory Visit: Payer: Self-pay

## 2021-06-17 ENCOUNTER — Encounter: Payer: Self-pay | Admitting: Physical Therapy

## 2021-06-17 DIAGNOSIS — G8929 Other chronic pain: Secondary | ICD-10-CM | POA: Insufficient documentation

## 2021-06-17 DIAGNOSIS — M545 Low back pain, unspecified: Secondary | ICD-10-CM | POA: Diagnosis present

## 2021-06-17 DIAGNOSIS — M6281 Muscle weakness (generalized): Secondary | ICD-10-CM

## 2021-06-17 DIAGNOSIS — R262 Difficulty in walking, not elsewhere classified: Secondary | ICD-10-CM

## 2021-06-17 DIAGNOSIS — R252 Cramp and spasm: Secondary | ICD-10-CM | POA: Diagnosis present

## 2021-06-17 NOTE — Patient Instructions (Signed)
Access Code: [email or web address] URL: [email or web address] Date: 06/17/2021 Prepared by: Amador Cunas  Exercises Supine Lower Trunk Rotation - 1 x daily - 7 x weekly - 2 sets - 10 reps - 5 sec hold Supine Posterior Pelvic Tilt - 1 x daily - 7 x weekly - 2 sets - 10 reps - 3 sec hold Seated Hamstring Stretch - 1 x daily - 7 x weekly - 2 sets - 2 reps - 20 sec hold

## 2021-06-17 NOTE — Therapy (Signed)
[facility]. [place], [place], [address] Phone: [phone]   Fax:  [phone]  Physical Therapy Evaluation  Patient Details  Name: Kathleen [name] MRN: [id number] Date of Birth: [birth year] Referring Provider (PT): Lauro Franklin Date: 06/17/2021   PT End of Session - 06/17/21 1726     Visit Number 1    Date for PT Re-Evaluation 09/09/21    PT Start Time 3419    PT Stop Time 1718    PT Time Calculation (min) 35 min    Activity Tolerance Patient limited by pain    Behavior During Therapy [facility] for tasks assessed/performed             Past Medical History:  Diagnosis Date   Anxiety    Arthritis    hands   Asthma    2 sets of PFT's in 04/09 without sign variability. Last set with significant decrease in FEV1 with saline alone, suggesting Asthma but  recommended clinical corelation    Asthma    Bipolar 1 disorder [facility])    therapist is Joy and is followed by [facility]   Blackout    negative work up including ESR, ANA, opthalmology referral, carotid dopplers, 2D echo, MRI and EEG.   BREAST LUMP 03/25/2008   Annotation: bilaterally Qualifier: Diagnosis of  By: Tomasa Hosteller MD, Edmon Crape.    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 Further work up includes CRP, ESR, SPEP/UPEP, hepatitis erology, A1C , repeat ANA   COPD (chronic obstructive pulmonary disease) ([place])    COPD exacerbation ([place]) 03/31/2019   Depression    DUB (dysfunctional uterine bleeding)    and pelvic pain, with negative endometrial bx in 07/09 and transvaginal U/S significant for mild fibroids in 0/09.   Emphysema of lung ([place])    GERD  (gastroesophageal reflux disease)    Hallucinations 03/18/2008   Qualifier: Diagnosis of  By: Redmond Pulling  MD, Mateo Flow     Hyperlipidemia    Lower extremity edema    Neg ABI's, normal 2D echo, normal albumin   Menorrhagia    Ovarian cyst    Polysubstance abuse ([place])    none since March 17,2009.   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    Subjective Assessment - 06/17/21 1645     Subjective Pt reports that she has been having LBP for the past 3-4 years, worsening intermittently. Pt states that it started after a fall down the stairs several years ago (2018). Pt also  reports a fall posteriorly over her dog a few weeks ago with worsening LBP since then. Pt denies N/T, saddle anesthesia, and bowel/bladder changes. Pt reports that sitting down, prolonged standing, and bending over aggravate pain. Pain is relieved by pain meds and heat. 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 She is also reporting 20+ falls in the past six months secondary to increased dizziness. Did not have time for full vestibular screen but basic CN screen clear and unable to reproduce dizziness with seated positional testing or when lying supine. Pt states she has had MRI through [facility] and will be reviewing on Friday; instructed to bring report to next rx because it is not showing in our system.    Pertinent History bipolar, chronic LBP, reports of falls/vertigo, asthma, COPD     Limitations Sitting;Lifting;Standing;Walking    How long can you sit comfortably? immediately uncomfortable    How long can you stand comfortably? <5 min    Diagnostic tests xrays lower back, states getting MRI of head Friday 06/19/21 (has been having some vertigo/falls recently)    Patient Stated Goals reduce LBP    Currently in Pain? Yes    Pain Score 10-Worst pain ever    Pain Location Back    Pain Orientation Lower    Pain Descriptors / Indicators Aching;Constant;Throbbing;Sharp    Pain Type Chronic pain    Pain Radiating Towards upper buttocks    Pain Onset Other (comment)   2018   Pain Frequency Constant    Aggravating Factors  prolonged sitting, standing, bending    Pain Relieving Factors pain meds, hot shower, massage                [facility] PT Assessment - 06/18/21 0001       Assessment   Medical Diagnosis LBP    Referring Provider (PT) Williams    Onset Date/Surgical Date --   2018   Hand Dominance Right    Next MD Visit 06/19/2021    Prior Therapy none      Precautions   Precautions None      Restrictions   Weight Bearing Restrictions No      Balance Screen   Has the patient fallen in the past 6 months Yes    How many times? 20    Has the patient had a decrease in activity level because of a fear of falling?  No    Is the patient reluctant to leave their home because of a fear of falling?  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; has another family member coming home from SNF soon    Leisure playing basketball, golf, walking dog      Sensation   Light Touch Appears Intact      Functional Tests   Functional tests Sit to Stand      Sit to Stand   Comments painful, slow movements with leaning to R side and thigh  walking to rise to standing  Posture/Postural Control   Posture Comments guarded posture, stiff with little rotation with walking      ROM / Strength   AROM / PROM / Strength AROM;Strength      AROM   Overall AROM Comments patient did not want to test lumbar flexion/extension d/t reports of vertigo; 50% limited lumbar rotation/SB. No dizziness reproduced with forward bending seated      Strength   Overall Strength Comments 4/5 BLE + pain      Flexibility   Soft Tissue Assessment /Muscle Length yes    Hamstrings very tight L>R    ITB tight    Piriformis tight      Palpation   Palpation comment ttp lumbar paraspinals      Special Tests   Other special tests SLR 40 deg LLE, 60 deg RLE      Transfers   Five time sit to stand comments  unable secondary to pain      Ambulation/Gait   Gait Comments antalgic gait with limited rotation and decreased reciprocal arm swing                        Objective measurements completed on examination: See above findings.               PT Education - 06/17/21 1725     Education Details Pt educated on POC and HEP    Person(s) Educated Patient    Methods Explanation;Demonstration;Handout    Comprehension Verbalized understanding;Returned demonstration              PT Short Term Goals - 06/17/21 1726       PT SHORT TERM GOAL #1   Title Pt will be I with initial HEP    Time 2    Period Weeks    Status New    Target Date 07/01/21               PT Long Term Goals - 06/17/21 1726       PT LONG TERM GOAL #1   Title Pt will be I with advanced HEP    Time 8    Period Weeks    Status New    Target Date 08/12/21      PT LONG TERM GOAL #2   Title Pt will report 50% reduced LBP    Time 8    Period Weeks    Status New    Target Date 08/13/21      PT LONG TERM GOAL #3   Title Pt will report able to perform ADLs with </= 2/10 LBP    Time 8    Period Weeks    Status New    Target Date  08/13/21      PT LONG TERM GOAL #4   Title Pt will report able to sit >30 min with no increase in LBP    Time 8    Period Weeks    Status New    Target Date 08/13/21                    Plan - 06/17/21 1726     Clinical Impression Statement Pt presents to clinic with reports of chronic LBP present for several years since a fall down the stairs in 2018 and worsening since recent fall tripping posteriorly over her dog. Pt demos limited lumbar AROM, + SLR B L>R, LE flexibility deficits, functional LE weakness, tenderness B lumbar parapinals, and  very guarded/slow/painful transitional movements. Pt demos difficulty walking with slight forward flexed posture and stiff movements; limited reciprocal arm swing and trunk rotation with ambulation. Additionally, pt is reporting 20+ falls in the past six months d/t frequent episodes of vertigo. Unable to reproduce vertigo with positional testing, CN screen clear, and no red flag sx present. May need more in depth neuro screen in future rx. Pt is reporting that she has had an MRI and is getting the results on Friday; however, none is seen in our system. Requested that she bring in the report next rx with VU and agreement. Follow up on this and proceed as indicated. 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 Pt would benefit from skilled PT to address the above impairments.    Personal Factors and Comorbidities Comorbidity 3+    Comorbidities bipolar, reports of falls/vertigo, asthma, COPD    Examination-Activity Limitations Caring for Others;Lift;Stand;Sit;Locomotion Level    Examination-Participation Restrictions Community Activity;Interpersonal Relationship    Stability/Clinical Decision Making Evolving/Moderate complexity    Clinical Decision Making Low    Rehab Potential Good    PT Frequency 2x / week   may decrease to 1x/wee after 1 month to preserve visits if indicated   PT Duration 8  weeks    PT Treatment/Interventions ADLs/Self Care Home Management;Electrical Stimulation;Iontophoresis 36m/ml Dexamethasone;Moist Heat;Neuromuscular re-education;Therapeutic exercise;Therapeutic activities;Patient/family education;Balance training;Manual techniques;Dry needling;Passive range of motion;Vestibular    PT Next Visit Plan follow up on reports of MRI/falls/vestibular issues, very gentle progression to TE, manual/modalities as indicated    PT Home Exercise Plan see pt instructions    Consulted and Agree with Plan of Care Patient             Patient will benefit from skilled therapeutic intervention in order to improve the following deficits and impairments:     Visit Diagnosis: Chronic bilateral low back pain without sciatica  Muscle weakness (generalized)  Cramp and spasm  Difficulty in walking, not elsewhere classified     Problem List Patient Active Problem List   Diagnosis Date Noted   Lumbar back pain 05/28/2021   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   Daytime somnolence 04/06/2019   Lumbar radiculopathy 07/26/2016   Depression 09/19/2015   Preventative health care 06/18/2014   Asthma-COPD overlap syndrome ([place] 05/13/2014   GERD 01/21/2010   Sacral back pain 11/27/2008   Generalized anxiety disorder 03/14/2008   Migraine variant 03/14/2008   AAmador Cunas PT, DPT ADonald ProseSugg 06/17/2021, 5:27 PM  [place] [address]Phone: [phone]  Fax:  [phone] Name: Kathleen BAXLEYMRN: [id number] of Birth: [birth year]

## 2021-06-26 ENCOUNTER — Ambulatory Visit: Payer: Medicaid Other | Admitting: Physical Therapy

## 2021-06-26 ENCOUNTER — Other Ambulatory Visit: Payer: Self-pay

## 2021-06-26 DIAGNOSIS — R262 Difficulty in walking, not elsewhere classified: Secondary | ICD-10-CM

## 2021-06-26 DIAGNOSIS — M545 Low back pain, unspecified: Secondary | ICD-10-CM | POA: Diagnosis not present

## 2021-06-26 DIAGNOSIS — R252 Cramp and spasm: Secondary | ICD-10-CM

## 2021-06-26 DIAGNOSIS — G8929 Other chronic pain: Secondary | ICD-10-CM

## 2021-06-26 DIAGNOSIS — M6281 Muscle weakness (generalized): Secondary | ICD-10-CM

## 2021-06-26 NOTE — Therapy (Signed)
[facility]. [place], [place], [address] Phone: [phone]   Fax:  [phone]  Physical Therapy Treatment  Patient Details  Name: Kathleen [name] MRN: [id number] Date of Birth: 09/30/1961 Referring Provider (PT): Lauro Franklin Date: 06/26/2021   PT End of Session - 06/26/21 1044     Visit Number 2    Date for PT Re-Evaluation 09/09/21    PT Start Time 1010    PT Stop Time 1100    PT Time Calculation (min) 50 min             Past Medical History:  Diagnosis Date   Anxiety    Arthritis    hands   Asthma    2 sets of PFT's in 04/09 without sign variability. Last set with significant decrease in FEV1 with saline alone, suggesting Asthma but  recommended clinical corelation    Asthma    Bipolar 1 disorder [facility])    therapist is Joy and is followed by [facility]   Blackout    negative work up including ESR, ANA, opthalmology referral, carotid dopplers, 2D echo, MRI and EEG.   BREAST LUMP 03/25/2008   Annotation: bilaterally Qualifier: Diagnosis of  By: Tomasa Hosteller MD, Edmon Crape.    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 Further work up includes CRP, ESR, SPEP/UPEP, hepatitis erology, A1C , repeat ANA   COPD (chronic obstructive pulmonary disease) ([place])    COPD exacerbation ([place]) 03/31/2019   Depression    DUB (dysfunctional uterine bleeding)    and pelvic pain, with negative endometrial bx in 07/09 and transvaginal U/S significant for mild fibroids in 0/09.   Emphysema of lung ([place])    GERD (gastroesophageal reflux disease)    Hallucinations 03/18/2008   Qualifier: Diagnosis of  By: Redmond Pulling  MD,  Mateo Flow     Hyperlipidemia    Lower extremity edema    Neg ABI's, normal 2D echo, normal albumin   Menorrhagia    Ovarian cyst    Polysubstance abuse ([place])    none since March 17,2009.   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   Subjective Assessment - 06/26/21 1014     Subjective doing HEP. pain always    Currently in Pain? Yes    Pain Score 9     Pain Location Back  [place] Adult PT Treatment/Exercise - 06/26/21 0001       Exercises   Exercises Lumbar;Knee/Hip      Lumbar Exercises: Aerobic   Nustep L 3 5 min      Lumbar Exercises: Standing   Other Standing Lumbar Exercises red tband 3 way scap stab 10 each      Lumbar Exercises: Seated   Sit to Stand 10 reps   wt ball chest press   Other Seated Lumbar Exercises pelvic ROM on dyna disc 10 x 4 way   LAQ and marching on dyna disc     Lumbar Exercises: Supine   Ab Set 10 reps;3 seconds    Clam 10 reps   red tband   Bent Knee Raise 10 reps   red tband   Other Supine Lumbar Exercises feet on ball bridge,KTC and obl 10 each      Modalities   Modalities Electrical Stimulation;Moist Heat      Moist Heat Therapy   Number Minutes Moist Heat 15 Minutes    Moist Heat Location Lumbar Spine      Electrical Stimulation   Electrical Stimulation Location LBP    Electrical Stimulation Action IFC    Electrical Stimulation Parameters sitting    Electrical Stimulation Goals Pain      Manual Therapy   Manual Therapy Passive ROM    Manual therapy comments guarded and pain limited    Passive ROM LEs  and trunk                      PT Short Term Goals - 06/26/21 1044       PT SHORT TERM GOAL #1   Title Pt will be I with initial HEP    Status Achieved               PT Long Term Goals - 06/17/21 1726       PT LONG TERM GOAL #1   Title Pt will be I with advanced HEP    Time 8    Period Weeks    Status New    Target Date 08/12/21      PT LONG TERM GOAL #2   Title Pt will report 50% reduced LBP    Time 8    Period Weeks    Status New    Target Date 08/13/21      PT LONG TERM GOAL #3   Title Pt will report able to perform ADLs with </= 2/10 LBP    Time 8    Period Weeks    Status New    Target Date 08/13/21      PT LONG TERM GOAL #4   Title Pt will report able to sit >30 min with no increase in LBP    Time 8    Period Weeks    Status New    Target Date 08/13/21                   Plan - 06/26/21 1044     Clinical Impression Statement STG met. Pt with high rating ut tolerted ther ex well- did verb pain but able to complete. Cuing needed for control for mvmt and stab. Added estim and MH to see if helped pain.    PT Treatment/Interventions ADLs/Self Care Home Management;Electrical Stimulation;Iontophoresis 55m/ml Dexamethasone;Moist Heat;Neuromuscular re-education;Therapeutic exercise;Therapeutic activities;Patient/family education;Balance training;Manual techniques;Dry needling;Passive range of motion;Vestibular    PT Next Visit Plan pt denies falls since eval/dizziness. pt states  no MRI results yet as she had asthma attack and went to ER. assess and progress as tolerated             Patient will benefit from skilled therapeutic intervention in order to improve the following deficits and impairments:  Abnormal gait, Decreased range of motion, Difficulty walking, Increased muscle spasms, Decreased activity tolerance, Pain, Hypomobility, Impaired flexibility, Decreased mobility, Decreased strength, Postural dysfunction  Visit  Diagnosis: Chronic bilateral low back pain without sciatica  Muscle weakness (generalized)  Cramp and spasm  Difficulty in walking, not elsewhere classified     Problem List Patient Active Problem List   Diagnosis Date Noted   Lumbar back pain 05/28/2021   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   Daytime somnolence 04/06/2019   Lumbar radiculopathy 07/26/2016   Depression 09/19/2015   Preventative health care 06/18/2014   Asthma-COPD overlap syndrome ([place]) 05/13/2014   GERD 01/21/2010   Sacral back pain 11/27/2008   Generalized anxiety disorder 03/14/2008   Migraine variant 03/14/2008    Rich Paprocki,ANGIE PTA 06/26/2021, 10:51 AM  [facility] [address]. [place], [place], [address] Phone: [phone]   Fax:  [phone]  Name: Kathleen [name] MRN: [id number] Date of Birth: Mar 26, 1961

## 2021-06-30 ENCOUNTER — Emergency Department (HOSPITAL_COMMUNITY)
Admission: EM | Admit: 2021-06-30 | Discharge: 2021-06-30 | Disposition: A | Discharge status: Home / Self Care | Payer: Medicaid Other | Attending: Emergency Medicine | Admitting: Emergency Medicine

## 2021-06-30 ENCOUNTER — Other Ambulatory Visit: Payer: Self-pay

## 2021-06-30 ENCOUNTER — Emergency Department (HOSPITAL_COMMUNITY): Payer: Medicaid Other

## 2021-06-30 ENCOUNTER — Encounter: Payer: Self-pay | Admitting: Physical Therapy

## 2021-06-30 ENCOUNTER — Ambulatory Visit: Payer: Medicaid Other | Admitting: Physical Therapy

## 2021-06-30 ENCOUNTER — Encounter (HOSPITAL_COMMUNITY): Payer: Self-pay | Admitting: Emergency Medicine

## 2021-06-30 VITALS — BP 104/79 | HR 103

## 2021-06-30 DIAGNOSIS — J45901 Unspecified asthma with (acute) exacerbation: Secondary | ICD-10-CM

## 2021-06-30 DIAGNOSIS — J45909 Unspecified asthma, uncomplicated: Secondary | ICD-10-CM | POA: Diagnosis not present

## 2021-06-30 DIAGNOSIS — J441 Chronic obstructive pulmonary disease with (acute) exacerbation: Secondary | ICD-10-CM | POA: Diagnosis not present

## 2021-06-30 DIAGNOSIS — R1110 Vomiting, unspecified: Secondary | ICD-10-CM | POA: Insufficient documentation

## 2021-06-30 DIAGNOSIS — R252 Cramp and spasm: Secondary | ICD-10-CM

## 2021-06-30 DIAGNOSIS — R0602 Shortness of breath: Secondary | ICD-10-CM

## 2021-06-30 DIAGNOSIS — R059 Cough, unspecified: Secondary | ICD-10-CM | POA: Insufficient documentation

## 2021-06-30 DIAGNOSIS — Z7982 Long term (current) use of aspirin: Secondary | ICD-10-CM | POA: Insufficient documentation

## 2021-06-30 DIAGNOSIS — G8929 Other chronic pain: Secondary | ICD-10-CM

## 2021-06-30 DIAGNOSIS — M6281 Muscle weakness (generalized): Secondary | ICD-10-CM

## 2021-06-30 DIAGNOSIS — M545 Low back pain, unspecified: Secondary | ICD-10-CM

## 2021-06-30 DIAGNOSIS — Z7951 Long term (current) use of inhaled steroids: Secondary | ICD-10-CM | POA: Diagnosis not present

## 2021-06-30 DIAGNOSIS — R262 Difficulty in walking, not elsewhere classified: Secondary | ICD-10-CM

## 2021-06-30 DIAGNOSIS — Z87891 Personal history of nicotine dependence: Secondary | ICD-10-CM | POA: Diagnosis not present

## 2021-06-30 LAB — COMPREHENSIVE METABOLIC PANEL
ALT: 27 U/L (ref 0–44)
AST: 40 U/L (ref 15–41)
Albumin: 4.1 g/dL (ref 3.5–5.0)
Alkaline Phosphatase: 100 U/L (ref 38–126)
Anion gap: 9 (ref 5–15)
BUN: 18 mg/dL (ref 6–20)
CO2: 27 mmol/L (ref 22–32)
Calcium: 9.1 mg/dL (ref 8.9–10.3)
Chloride: 106 mmol/L (ref 98–111)
Creatinine, Ser: 1.38 mg/dL — ABNORMAL HIGH (ref 0.44–1.00)
GFR, Estimated: 44 mL/min — ABNORMAL LOW (ref >60)
Glucose, Bld: 73 mg/dL (ref 70–99)
Potassium: 5.1 mmol/L (ref 3.5–5.1)
Sodium: 142 mmol/L (ref 135–145)
Total Bilirubin: 0.9 mg/dL (ref 0.3–1.2)
Total Protein: 7.5 g/dL (ref 6.5–8.1)

## 2021-06-30 LAB — CBC
HCT: 46.7 % — ABNORMAL HIGH (ref 36.0–46.0)
Hemoglobin: 14.9 g/dL (ref 12.0–15.0)
MCH: 28 pg (ref 26.0–34.0)
MCHC: 31.9 g/dL (ref 30.0–36.0)
MCV: 87.6 fL (ref 80.0–100.0)
Platelets: 353 10*3/uL (ref 150–400)
RBC: 5.33 MIL/uL — ABNORMAL HIGH (ref 3.87–5.11)
RDW: 13.4 % (ref 11.5–15.5)
WBC: 8.1 10*3/uL (ref 4.0–10.5)
nRBC: 0 % (ref 0.0–0.2)

## 2021-06-30 MED ORDER — ALBUTEROL SULFATE HFA 108 (90 BASE) MCG/ACT IN AERS
2.0000 | INHALATION_SPRAY | Freq: Once | RESPIRATORY_TRACT | Status: AC
  Administered 2021-06-30: 2 via RESPIRATORY_TRACT
  Filled 2021-06-30: qty 6.7

## 2021-06-30 MED ORDER — IPRATROPIUM BROMIDE HFA 17 MCG/ACT IN AERS
2.0000 | INHALATION_SPRAY | Freq: Once | RESPIRATORY_TRACT | Status: AC
  Administered 2021-06-30: 2 via RESPIRATORY_TRACT
  Filled 2021-06-30: qty 12.9

## 2021-06-30 MED ORDER — MAGNESIUM SULFATE 2 GM/50ML IV SOLN
2.0000 g | Freq: Once | INTRAVENOUS | Status: AC
  Administered 2021-06-30: 2 g via INTRAVENOUS
  Filled 2021-06-30: qty 50

## 2021-06-30 MED ORDER — METHYLPREDNISOLONE SODIUM SUCC 125 MG IJ SOLR
125.0000 mg | Freq: Once | INTRAMUSCULAR | Status: AC
  Administered 2021-06-30: 125 mg via INTRAVENOUS
  Filled 2021-06-30: qty 2

## 2021-06-30 MED ORDER — OXYCODONE-ACETAMINOPHEN 5-325 MG PO TABS
1.0000 | ORAL_TABLET | Freq: Once | ORAL | Status: AC
  Administered 2021-06-30: 1 via ORAL
  Filled 2021-06-30: qty 1

## 2021-06-30 MED ORDER — PREDNISONE 10 MG (21) PO TBPK: ORAL_TABLET | Freq: Every day | ORAL | 0 refills | Status: DC

## 2021-06-30 NOTE — ED Notes (Signed)
Patient says "I dont want that covid test again yall shove it up to my brain I cant go through this anymore."

## 2021-06-30 NOTE — ED Triage Notes (Addendum)
Pt arrived POV c/o shob. Hx of asthma. States it feels like an asthma attack. Pt attempted a breathing treatment and inhaler at home with no relief. Also c/o N/V and HA from being shob.

## 2021-06-30 NOTE — ED Notes (Signed)
X-ray at bedside

## 2021-06-30 NOTE — Therapy (Signed)
[facility]. [place], [place], [address] Phone: [phone]   Fax:  [phone]  Physical Therapy Treatment  Patient Details  Name: Kathleen [name] MRN: [id number] Date of Birth: Jun 26, 1961 Referring Provider (PT): Lauro Franklin Date: 06/30/2021   PT End of Session - 06/30/21 1827     Visit Number 2    Date for PT Re-Evaluation 09/09/21    PT Start Time 1815    PT Stop Time 1822    PT Time Calculation (min) 7 min             Past Medical History:  Diagnosis Date   Anxiety    Arthritis    hands   Asthma    2 sets of PFT's in 04/09 without sign variability. Last set with significant decrease in FEV1 with saline alone, suggesting Asthma but  recommended clinical corelation    Asthma    Bipolar 1 disorder [facility])    therapist is Joy and is followed by [facility]   Blackout    negative work up including ESR, ANA, opthalmology referral, carotid dopplers, 2D echo, MRI and EEG.   BREAST LUMP 03/25/2008   Annotation: bilaterally Qualifier: Diagnosis of  By: Tomasa Hosteller MD, Edmon Crape.    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 Further work up includes CRP, ESR, SPEP/UPEP, hepatitis erology, A1C , repeat ANA   COPD (chronic obstructive pulmonary disease) ([place])    COPD exacerbation ([place]) 03/31/2019   Depression    DUB (dysfunctional uterine bleeding)    and pelvic pain, with negative endometrial bx in 07/09 and transvaginal U/S significant for mild fibroids in 0/09.   Emphysema of lung ([place])    GERD (gastroesophageal reflux disease)    Hallucinations 03/18/2008   Qualifier: Diagnosis of  By: Redmond Pulling  MD,  Mateo Flow     Hyperlipidemia    Lower extremity edema    Neg ABI's, normal 2D echo, normal albumin   Menorrhagia    Ovarian cyst    Polysubstance abuse ([place])    none since March 17,2009.   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 States she just completed a breathing treatment and feels like she is okay to complete PT.    Currently in Pain? Yes    Pain Score 7     Pain Location  Back                                         PT Short Term Goals - 06/26/21 1044       PT SHORT TERM GOAL #1   Title Pt will be I with initial HEP    Status Achieved               PT Long Term Goals - 06/17/21 1726       PT LONG TERM GOAL #1   Title Pt will be I with advanced HEP    Time 8    Period Weeks    Status New    Target Date 08/12/21      PT LONG TERM GOAL #2   Title Pt will report 50% reduced LBP    Time 8    Period Weeks    Status New    Target Date 08/13/21      PT LONG TERM GOAL #3   Title Pt will report able to perform ADLs with </= 2/10 LBP    Time 8    Period Weeks    Status New    Target Date 08/13/21      PT LONG TERM GOAL #4   Title Pt will report able to sit >30 min with no increase in LBP    Time 8    Period Weeks    Status New    Target Date 08/13/21                   Plan - 06/30/21 1822     Clinical Impression Statement No charge visit today. Pt presents to clinic report exacerbation of asthma.  Demos wheezing with inspiration/expiration, c/o SOB, and anterior trunk lean for full breath. SPO2 93-94% and mild elevated resting HR. Pt states she just completed a breathing treatment but forgot inhaler at home. Increasing SOB while speaking with PT. Pt states she frequently must go to ED for asthma attacks. Advised pt to go to ED immediately. Pt denies lightheadedness, changes in vision, states this is "her normal." Offered to call ambulance, pt denied. Pt ambulated out of clinic stating she was going straight to ED.    PT Treatment/Interventions ADLs/Self Care Home Management;Electrical Stimulation;Iontophoresis 39m/ml Dexamethasone;Moist Heat;Neuromuscular re-education;Therapeutic exercise;Therapeutic activities;Patient/family education;Balance training;Manual techniques;Dry needling;Passive range of motion;Vestibular    PT Next Visit Plan resume PT if indicated, follow up on asthma attack. assess and progress as tolerated    Consulted and Agree with Plan of Care Patient             Patient will benefit from skilled therapeutic intervention in order to improve the following deficits and impairments:  Abnormal gait, Decreased range of motion, Difficulty walking, Increased muscle spasms, Decreased activity tolerance, Pain, Hypomobility, Impaired flexibility, Decreased mobility, Decreased strength, Postural dysfunction  Visit Diagnosis: Chronic bilateral low back pain without sciatica  Muscle weakness (generalized)  Cramp and spasm  Difficulty in walking, not elsewhere classified     Problem List Patient Active Problem List   Diagnosis Date Noted   Lumbar back pain 05/28/2021   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   Daytime somnolence 04/06/2019   Lumbar radiculopathy 07/26/2016   Depression 09/19/2015   Preventative health care 06/18/2014   [place] 05/13/2014   GERD 01/21/2010   Sacral back pain 11/27/2008   Generalized anxiety disorder  03/14/2008    Migraine variant 03/14/2008   Amador Cunas, PT, DPT Donald Prose Trygg Mantz 06/30/2021, 6:27 PM  [facility]. [place], [place], [address] Phone: [phone]   Fax:  [phone]  Name: Kathleen [name] MRN: [id number] Date of Birth: [birth year]

## 2021-06-30 NOTE — ED Provider Notes (Signed)
[facility] DEPT Provider Note   CSN: [id number] Arrival date & time: 06/30/21  1841     History Chief Complaint  Patient presents with   Shortness of Breath   Emesis    Kathleen [name] is a 60 y.o. female.  60 year old female with history of asthma presents with 2 days of shortness of breath with increased cough.  No fever or chills.  She has had posttussive emesis.  Denies any chest pain.  Patient has been using her home nebulizer as well as her inhaler without relief.  Has been hospitalized for the past for this.      Past Medical History:  Diagnosis Date   Anxiety    Arthritis    hands   Asthma    2 sets of PFT's in 04/09 without sign variability. Last set with significant decrease in FEV1 with saline alone, suggesting Asthma but  recommended clinical corelation    Asthma    Bipolar 1 disorder [facility])    therapist is Joy and is followed by [facility]   Blackout    negative work up including ESR, ANA, opthalmology referral, carotid dopplers, 2D echo, MRI and EEG.   BREAST LUMP 03/25/2008   Annotation: bilaterally Qualifier: Diagnosis of  By: Tomasa Hosteller MD, Edmon Crape.    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 Further work up includes CRP, ESR, SPEP/UPEP, hepatitis erology, A1C , repeat ANA   COPD (chronic obstructive pulmonary disease) ([place])    COPD exacerbation ([place]) 03/31/2019   Depression    DUB (dysfunctional uterine bleeding)    and pelvic pain, with negative endometrial bx in 07/09 and transvaginal U/S significant for mild fibroids in 0/09.   Emphysema of lung ([place])    GERD (gastroesophageal reflux disease)    Hallucinations  03/18/2008   Qualifier: Diagnosis of  By: Redmond Pulling  MD, Mateo Flow     Hyperlipidemia    Lower extremity edema    Neg ABI's, normal 2D echo, normal albumin   Menorrhagia    Ovarian cyst    Polysubstance abuse ([place])    none since March 17,2009.   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   GERD 01/21/2010   Sacral back pain 11/27/2008   Generalized anxiety disorder 03/14/2008   Migraine variant 03/14/2008    Past Surgical History:  Procedure Laterality Date   CESAREAN SECTION     CESAREAN SECTION     COLONOSCOPY     HERNIA REPAIR     Left partial oophorectomy     OOPHORECTOMY     1/2 ovary removed   POLYPECTOMY     VAGINA SURGERY  mesh     OB History     Gravida  3   Para  2   Term  2   Preterm  0   AB  1   Living  2      SAB  0   IAB  1   Ectopic  0   Multiple      Live Births  2           Family History  Problem Relation Age of Onset   Colon cancer Mother 61   Breast cancer Mother 79   Rectal cancer Mother    Diabetes Father    Hypertension Father    Kidney disease Father    Colon cancer Father 47   Prostate cancer Father    Cancer Father    Kidney disease Sister    Cancer Brother    Breast cancer Maternal Grandmother 84   Esophageal cancer Neg Hx    Stomach cancer Neg Hx     Social History   Tobacco Use   Smoking status: Former    Packs/day: 0.25    Years: 30.00    Pack years: 7.50    Types: Cigarettes    Quit date: 12/28/2016    Years since quitting: 4.5   Smokeless tobacco: Never   Tobacco comments:    Occ. Wants Nicotine Patches    Vaping Use   Vaping Use: Never used  Substance Use Topics   Alcohol use: Yes    Alcohol/week: 0.0 standard drinks    Comment: rarely   Drug use: Not Currently    Types: Marijuana    Comment: Sometimes.    Home Medications Prior to Admission medications   Medication Sig Start Date End Date Taking? Authorizing Provider  albuterol (PROVENTIL) (2.5 MG/3ML) 0.083% nebulizer solution Take 3 mLs (2.5 mg total) by nebulization every 4 (four) hours as needed for wheezing or shortness of breath. 04/02/21 04/02/22  Mosetta Anis, MD  ASPIRIN ADULT LOW STRENGTH 81 MG EC tablet TAKE 1 Tablet BY MOUTH ONCE DAILY Patient taking differently: Take 81 mg by mouth daily. 12/03/19   Helberg, Larkin Ina, MD  DULERA 200-5 MCG/ACT AERO Inhale 2 puffs into the lungs in the morning and at bedtime. 05/20/21   Hunsucker, [name], MD  EPINEPHrine 0.3 mg/0.3 mL IJ SOAJ injection Inject 0.3 mg into the muscle as needed for anaphylaxis. 05/27/21   [name], MD  fluticasone (FLONASE) 50 MCG/ACT nasal spray Place 1 spray into both nostrils daily. 03/26/21 03/26/22  [name], MD  HYDROcodone-acetaminophen (NORCO/VICODIN) 5-325 MG tablet Take 1 tablet by mouth every 4 (four) hours as needed. 03/17/21   [name], MD  lidocaine (LIDODERM) 5 % Place 1 patch onto the skin daily. Remove & Discard patch within 12 hours or as directed by MD 05/27/21   [name], MD  loratadine (CLARITIN) 10 MG tablet Take 1 tablet (10 mg total) by mouth daily. 03/26/21   [name], MD  montelukast (SINGULAIR) 10 MG tablet Take 1 tablet (10 mg total) by mouth at bedtime. 03/19/21   Mosetta Anis, MD  ondansetron (ZOFRAN) 4 MG tablet TAKE 1 TABLET BY MOUTH ONCE DAILY AS NEEDED FOR NAUSEA FOR VOMITING 02/18/21   [name], MD  pantoprazole (PROTONIX) 40 MG tablet Take 1 tablet (40 mg total) by mouth daily. 04/02/21   Mosetta Anis, MD  PROAIR HFA 108 [phone] Base) MCG/ACT inhaler INHALE 1 TO 2 PUFFS BY MOUTH EVERY 6 HOURS AS  NEEDED FOR WHEEZING FOR SHORTNESS OF BREATH 05/20/21   Hunsucker, [name], MD  tiotropium (SPIRIVA HANDIHALER) 18 MCG inhalation capsule Place 1 capsule (18 mcg total) into inhaler and inhale daily. 05/20/21   Hunsucker, [name], MD  divalproex (DEPAKOTE ER) 500 MG 24 hr tablet Take 1 tablet (500 mg total) by mouth 2 (two) times daily. Patient not taking: Reported on 10/08/2019 08/09/19 03/23/20  [name], MD  ipratropium-albuterol (DUONEB) 0.5-2.5 (3) MG/3ML SOLN Take 3 mLs by nebulization every 6 (six) hours as needed (wheezing or shortness of breath). Patient not taking: Reported on 03/19/2020 06/09/19 03/23/20  [name], MD  metoCLOPramide (REGLAN) 5 MG tablet Take 1 tablet (5 mg total) by mouth 3 (three) times daily before meals for 5 days. Patient not taking: Reported on 03/19/2020 07/17/19 03/23/20  [name], MD  SUMAtriptan (IMITREX) 25 MG tablet Take 1 tablet (25 mg total) by mouth every 2 (two) hours as needed for migraine or headache. Patient not taking: Reported on 10/08/2019 07/24/19 03/23/20  [name], DO    Allergies    Bee venom, Peach [prunus persica], Topiramate, and Tramadol  Review of Systems   Review of Systems  All other systems reviewed and are negative.  Physical Exam Updated Vital Signs BP 101/86   Pulse 93   Temp 98.1 F (36.7 C) (Oral)   Resp (!) 23   LMP 11/08/2014   SpO2 97%   Physical Exam Vitals and nursing note reviewed.  Constitutional:      General: She is not in acute distress.    Appearance: Normal appearance. She is well-developed. She is not toxic-appearing.  HENT:     Head: Normocephalic and atraumatic.  Eyes:     General: Lids are normal.     Conjunctiva/sclera: Conjunctivae normal.     Pupils: Pupils are equal, round, and reactive to light.  Neck:     Thyroid: No thyroid mass.     Trachea: No tracheal deviation.  Cardiovascular:     Rate and Rhythm: Normal rate and regular rhythm.     Heart sounds:  Normal heart sounds. No murmur heard.   No gallop.  Pulmonary:     Effort: Tachypnea and respiratory distress present.     Breath sounds: No stridor. Decreased breath sounds and wheezing present. No rhonchi or rales.  Abdominal:     General: There is no distension.     Palpations: Abdomen is soft.     Tenderness: There is no abdominal tenderness. There is no rebound.  Musculoskeletal:        General: No tenderness. Normal range of motion.     Cervical back: Normal range of motion and neck supple.  Skin:    General: Skin is warm and dry.     Findings: No abrasion or rash.  Neurological:     Mental Status: She is alert and oriented to person, place, and time. Mental status is at baseline.     GCS: GCS eye subscore is 4. GCS verbal subscore is 5. GCS motor subscore is 6.     Cranial Nerves: Cranial nerves are intact. No cranial nerve deficit.     Sensory: No sensory deficit.     Motor: Motor function is intact.  Psychiatric:        Attention and Perception: Attention normal.        Speech: Speech normal.        Behavior: Behavior normal.    ED Results / Procedures /  Treatments   Labs (all labs ordered are listed, but only abnormal results are displayed) Labs Reviewed  CBC - Abnormal; Notable for the following components:      Result Value   RBC 5.33 (*)    HCT 46.7 (*)    All other components within normal limits  RESP PANEL BY RT-PCR (FLU A&B, COVID) ARPGX2  COMPREHENSIVE METABOLIC PANEL  URINALYSIS, ROUTINE W REFLEX MICROSCOPIC    EKG EKG Interpretation  Date/Time:  Tuesday June 30 2021 18:48:28 EDT Ventricular Rate:  93 PR Interval:  136 QRS Duration: 86 QT Interval:  377 QTC Calculation: 469 R Axis:   -57 Text Interpretation: Sinus rhythm LAD, consider left anterior fascicular block Abnormal R-wave progression, early transition ST elevation, consider inferior injury Baseline wander in lead(s) V1 Confirmed by Lacretia Leigh ([id number]) on 06/30/2021 8:48:37  PM  Radiology No results found.  Procedures Procedures   Medications Ordered in ED Medications  methylPREDNISolone sodium succinate (SOLU-MEDROL) 125 mg/2 mL injection 125 mg (has no administration in time range)  magnesium sulfate IVPB 2 g 50 mL (has no administration in time range)  albuterol (VENTOLIN HFA) 108 (90 Base) MCG/ACT inhaler 2 puff (has no administration in time range)  ipratropium (ATROVENT HFA) inhaler 2 puff (has no administration in time range)    ED Course  I have reviewed the triage vital signs and the nursing notes.  Pertinent labs & imaging results that were available during my care of the patient were reviewed by me and considered in my medical decision making (see chart for details).    MDM Rules/Calculators/A&P                           Patient treated with Solu-Medrol, magnesium, and albuterol as well as Atrovent.  Chest x-ray without acute findings.  Labs reassuring.  Patient has deferred her COVID test.  Feels that this is likely just an asthma is observation.  Placed on prednisone.  She has a home nebulizer and was encouraged to continue that.  She is also encouraged to return if worse  Final Clinical Impression(s) / ED Diagnoses Final diagnoses:  None    Rx / DC Orders ED Discharge Orders     None        Lacretia Leigh, MD 06/30/21 2053

## 2021-07-02 ENCOUNTER — Ambulatory Visit: Payer: Medicaid Other | Admitting: Physical Therapy

## 2021-07-07 ENCOUNTER — Ambulatory Visit: Payer: Medicaid Other

## 2021-07-09 ENCOUNTER — Ambulatory Visit: Payer: Medicaid Other | Admitting: Physical Therapy

## 2021-07-14 ENCOUNTER — Other Ambulatory Visit (HOSPITAL_COMMUNITY): Payer: Medicaid Other

## 2021-07-15 ENCOUNTER — Other Ambulatory Visit: Payer: Self-pay

## 2021-07-15 ENCOUNTER — Ambulatory Visit: Payer: Medicaid Other | Attending: Internal Medicine

## 2021-07-15 DIAGNOSIS — R252 Cramp and spasm: Secondary | ICD-10-CM | POA: Diagnosis present

## 2021-07-15 DIAGNOSIS — M6281 Muscle weakness (generalized): Secondary | ICD-10-CM | POA: Diagnosis present

## 2021-07-15 DIAGNOSIS — G8929 Other chronic pain: Secondary | ICD-10-CM | POA: Insufficient documentation

## 2021-07-15 DIAGNOSIS — M545 Low back pain, unspecified: Secondary | ICD-10-CM | POA: Insufficient documentation

## 2021-07-15 DIAGNOSIS — R262 Difficulty in walking, not elsewhere classified: Secondary | ICD-10-CM | POA: Diagnosis present

## 2021-07-15 NOTE — Therapy (Signed)
[place]. [place], [place], [address] Phone: [phone]   Fax:  [phone]  Physical Therapy Treatment  Patient Details  Name: Kathleen [name] MRN: [id number] Date of Birth: October 07, 1961 Referring Provider (PT): Lauro Franklin Date: 07/15/2021   PT End of Session - 07/15/21 1054     Visit Number 3    Date for PT Re-Evaluation 09/09/21    PT Start Time 1048    PT Stop Time 1130    PT Time Calculation (min) 42 min    Activity Tolerance Patient limited by pain    Behavior During Therapy [facility] for tasks assessed/performed             Past Medical History:  Diagnosis Date   Anxiety    Arthritis    hands   Asthma    2 sets of PFT's in 04/09 without sign variability. Last set with significant decrease in FEV1 with saline alone, suggesting Asthma but  recommended clinical corelation    Asthma    Bipolar 1 disorder [facility])    therapist is Joy and is followed by [facility]   Blackout    negative work up including ESR, ANA, opthalmology referral, carotid dopplers, 2D echo, MRI and EEG.   BREAST LUMP 03/25/2008   Annotation: bilaterally Qualifier: Diagnosis of  By: Tomasa Hosteller MD, Edmon Crape.    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 Further work up includes CRP, ESR, SPEP/UPEP, hepatitis erology, A1C , repeat ANA   COPD (chronic obstructive pulmonary disease) ([place])    COPD exacerbation ([place]) 03/31/2019   Depression    DUB (dysfunctional uterine bleeding)    and pelvic pain, with negative endometrial bx in 07/09 and transvaginal U/S significant for mild fibroids in 0/09.   Emphysema of lung ([place])    GERD  (gastroesophageal reflux disease)    Hallucinations 03/18/2008   Qualifier: Diagnosis of  By: Redmond Pulling  MD, Mateo Flow     Hyperlipidemia    Lower extremity edema    Neg ABI's, normal 2D echo, normal albumin   Menorrhagia    Ovarian cyst    Polysubstance abuse ([place])    none since March 17,2009.   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   Subjective Assessment - 07/15/21 1050     Subjective "back is all wacked" fell again, fell yesterday off balance    Currently in Pain? Yes    Pain Score 10-Worst pain ever    Pain Location Back  [facility] Adult PT Treatment/Exercise - 07/15/21 0001       Lumbar Exercises: Stretches   Pelvic Tilt --   x 10 M/L, x 15 A-P. seated on dynadisc. LAQ and marching on dynadisc 2 x 10 each     Lumbar Exercises: Aerobic   Nustep L2 x 6 min      Lumbar Exercises: Seated   Sit to Stand 10 reps     Lumbar Exercises: Supine   Other Supine Lumbar Exercises feet on ball bridge,KTC and obl  15 each      Modalities   Modalities Electrical Stimulation;Moist Heat      Moist Heat Therapy   Number Minutes Moist Heat 13 Minutes    Moist Heat Location Lumbar Spine      Electrical Stimulation   Electrical Stimulation Location LBP    Electrical Stimulation Action IFC    Electrical Stimulation Parameters sitting    Electrical Stimulation Goals Pain                      PT Short Term Goals - 06/26/21 1044       PT SHORT TERM GOAL #1   Title Pt will be I with initial HEP    Status Achieved               PT Long  Term Goals - 06/17/21 1726       PT LONG TERM GOAL #1   Title Pt will be I with advanced HEP    Time 8    Period Weeks    Status New    Target Date 08/12/21      PT LONG TERM GOAL #2   Title Pt will report 50% reduced LBP    Time 8    Period Weeks    Status New    Target Date 08/13/21      PT LONG TERM GOAL #3   Title Pt will report able to perform ADLs with </= 2/10 LBP    Time 8    Period Weeks    Status New    Target Date 08/13/21      PT LONG TERM GOAL #4   Title Pt will report able to sit >30 min with no increase in LBP    Time 8    Period Weeks    Status New    Target Date 08/13/21                   Plan - 07/15/21 1054     Clinical Impression Statement Last visit 06/30/21 pt came in with exacerbation of asthma and subsequently went to ED for exacerbation. Pt reports she has been more stable with asthma, uses nebulizer at home as needed, does okay indoor with air conditioning but worse if outside. Does report she has had a few falls since last here most recent yesterday. She tolerated exercises today with some pain, so end of session focused with modalities to help decrease mms guarding and for some relief as she reported this helped previously.    PT Treatment/Interventions ADLs/Self Care Home Management;Electrical Stimulation;Iontophoresis 68m/ml Dexamethasone;Moist Heat;Neuromuscular re-education;Therapeutic exercise;Therapeutic activities;Patient/family education;Balance training;Manual techniques;Dry needling;Passive range of motion;Vestibular    PT Next Visit Plan assess and progress as tolerated. follow up on any new asthma attacks    Consulted and Agree with Plan of Care Patient             Patient will benefit from skilled therapeutic intervention in order  to improve the following deficits and impairments:  Abnormal gait, Decreased range of motion, Difficulty walking, Increased muscle spasms, Decreased activity tolerance, Pain, Hypomobility,  Impaired flexibility, Decreased mobility, Decreased strength, Postural dysfunction  Visit Diagnosis: Chronic bilateral low back pain without sciatica  Muscle weakness (generalized)  Cramp and spasm  Difficulty in walking, not elsewhere classified     Problem List Patient Active Problem List   Diagnosis Date Noted   Lumbar back pain 05/28/2021   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   Daytime somnolence 04/06/2019   Lumbar radiculopathy 07/26/2016   Depression 09/19/2015   Preventative health care 06/18/2014   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 [place], [place], [address] Phone: [phone]   Fax:  [phone]  Name: HENDRIX CONSOLE MRN: [id number] Date of Birth: 1961/05/31

## 2021-07-17 ENCOUNTER — Other Ambulatory Visit: Payer: Self-pay

## 2021-07-17 ENCOUNTER — Ambulatory Visit (INDEPENDENT_AMBULATORY_CARE_PROVIDER_SITE_OTHER): Payer: Medicaid Other | Admitting: Pulmonary Disease

## 2021-07-17 DIAGNOSIS — R0609 Other forms of dyspnea: Secondary | ICD-10-CM

## 2021-07-17 DIAGNOSIS — R0600 Dyspnea, unspecified: Secondary | ICD-10-CM | POA: Diagnosis not present

## 2021-07-17 LAB — PULMONARY FUNCTION TEST
DL/VA % pred: 74 %
DL/VA: 3.2 ml/min/mmHg/L
DLCO cor % pred: 76 %
DLCO cor: 14.44 ml/min/mmHg
DLCO unc % pred: 79 %
DLCO unc: 15.06 ml/min/mmHg
FEF 25-75 Post: 1.62 L/sec
FEF 25-75 Pre: 0.98 L/sec
FEF2575-%Change-Post: 65 %
FEF2575-%Pred-Post: 83 %
FEF2575-%Pred-Pre: 50 %
FEV1-%Change-Post: 13 %
FEV1-%Pred-Post: 95 %
FEV1-%Pred-Pre: 84 %
FEV1-Post: 1.83 L
FEV1-Pre: 1.62 L
FEV1FVC-%Change-Post: 0 %
FEV1FVC-%Pred-Pre: 87 %
FEV6-%Change-Post: 12 %
FEV6-%Pred-Post: 110 %
FEV6-%Pred-Pre: 98 %
FEV6-Post: 2.59 L
FEV6-Pre: 2.3 L
FEV6FVC-%Change-Post: 0 %
FEV6FVC-%Pred-Post: 103 %
FEV6FVC-%Pred-Pre: 102 %
FVC-%Change-Post: 12 %
FVC-%Pred-Post: 107 %
FVC-%Pred-Pre: 95 %
FVC-Post: 2.61 L
FVC-Pre: 2.32 L
Post FEV1/FVC ratio: 70 %
Post FEV6/FVC ratio: 99 %
Pre FEV1/FVC ratio: 70 %
Pre FEV6/FVC Ratio: 99 %
RV % pred: 130 %
RV: 2.46 L
TLC % pred: 114 %
TLC: 5.45 L

## 2021-07-17 NOTE — Progress Notes (Signed)
PFT done today. 

## 2021-07-20 ENCOUNTER — Other Ambulatory Visit: Payer: Self-pay

## 2021-07-20 ENCOUNTER — Ambulatory Visit: Payer: Medicaid Other

## 2021-07-20 DIAGNOSIS — M545 Low back pain, unspecified: Secondary | ICD-10-CM

## 2021-07-20 DIAGNOSIS — R262 Difficulty in walking, not elsewhere classified: Secondary | ICD-10-CM

## 2021-07-20 DIAGNOSIS — G8929 Other chronic pain: Secondary | ICD-10-CM

## 2021-07-20 DIAGNOSIS — R252 Cramp and spasm: Secondary | ICD-10-CM

## 2021-07-20 DIAGNOSIS — M6281 Muscle weakness (generalized): Secondary | ICD-10-CM

## 2021-07-20 NOTE — Therapy (Signed)
[place]. [place], [place], [address] Phone: [phone]   Fax:  [phone]  Physical Therapy Treatment  Patient Details  Name: Kathleen [name] MRN: [id number] Date of Birth: 1961-11-17 Referring Provider (PT): Lauro Franklin Date: 07/20/2021   PT End of Session - 07/20/21 1815     Visit Number 4    Date for PT Re-Evaluation 09/09/21    PT Start Time 1811    PT Stop Time 1855    PT Time Calculation (min) 44 min    Activity Tolerance Patient limited by pain    Behavior During Therapy [facility] for tasks assessed/performed             Past Medical History:  Diagnosis Date   Anxiety    Arthritis    hands   Asthma    2 sets of PFT's in 04/09 without sign variability. Last set with significant decrease in FEV1 with saline alone, suggesting Asthma but  recommended clinical corelation    Asthma    Bipolar 1 disorder [facility])    therapist is Joy and is followed by [facility]   Blackout    negative work up including ESR, ANA, opthalmology referral, carotid dopplers, 2D echo, MRI and EEG.   BREAST LUMP 03/25/2008   Annotation: bilaterally Qualifier: Diagnosis of  By: Tomasa Hosteller MD, Edmon Crape.    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 Further work up includes CRP, ESR, SPEP/UPEP, hepatitis erology, A1C , repeat ANA   COPD (chronic obstructive pulmonary disease) ([place])    COPD exacerbation ([place]) 03/31/2019   Depression    DUB (dysfunctional uterine bleeding)    and pelvic pain, with negative endometrial bx in 07/09 and transvaginal U/S significant for mild fibroids in 0/09.   Emphysema of lung ([place])    GERD  (gastroesophageal reflux disease)    Hallucinations 03/18/2008   Qualifier: Diagnosis of  By: Redmond Pulling  MD, Mateo Flow     Hyperlipidemia    Lower extremity edema    Neg ABI's, normal 2D echo, normal albumin   Menorrhagia    Ovarian cyst    Polysubstance abuse ([place])    none since March 17,2009.   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   Subjective Assessment - 07/20/21 1814     Subjective took some medication before I came and its starting to kick in.    Currently in Pain? Yes    Pain Score 8     Pain Location Back  Pain Orientation Lower                               [facility] Adult PT Treatment/Exercise - 07/20/21 0001       Lumbar Exercises: Aerobic   Nustep L2 x 6 min      Lumbar Exercises: Supine   Ab Set 10 reps;3 seconds    Pelvic Tilt 10 reps    Clam 10 reps   red tband   Bent Knee Raise 10 reps   red tband   Other Supine Lumbar Exercises feet on ball bridge,KTC and obl , iso abs with pball 15 each      Modalities   Modalities Electrical Stimulation;Moist Heat      Moist Heat Therapy   Number Minutes Moist Heat 10 Minutes    Moist Heat Location Lumbar Spine      Electrical Stimulation   Electrical Stimulation Location LBP    Electrical Stimulation Action IFC    Electrical Stimulation Parameters sitting    Electrical Stimulation Goals Pain      Manual Therapy   Manual therapy comments guarded and pain limited    Passive ROM LEs and trunk. Trialed sheet traction 1 minute on/ 1 min off x 2                      PT Short Term Goals - 06/26/21 1044        PT SHORT TERM GOAL #1   Title Pt will be I with initial HEP    Status Achieved               PT Long Term Goals - 06/17/21 1726       PT LONG TERM GOAL #1   Title Pt will be I with advanced HEP    Time 8    Period Weeks    Status New    Target Date 08/12/21      PT LONG TERM GOAL #2   Title Pt will report 50% reduced LBP    Time 8    Period Weeks    Status New    Target Date 08/13/21      PT LONG TERM GOAL #3   Title Pt will report able to perform ADLs with </= 2/10 LBP    Time 8    Period Weeks    Status New    Target Date 08/13/21      PT LONG TERM GOAL #4   Title Pt will report able to sit >30 min with no increase in LBP    Time 8    Period Weeks    Status New    Target Date 08/13/21                   Plan - 07/20/21 1845     Clinical Impression Statement Back pain continues to be irritable and pt rating high, this limited tolerance to exercises. Focused on supine flexibility, mobility and core stab as tolerated. Anterior pelvic tilt exacerbated pain while PPT not as much. trialed sheet traction total 4 minutes (1 min on/off x 2) with good tolerance with first minute but did get a spasm with the 2nd that dissipated once stopped.  Pt requested modalities end of session d/t previous short term relief.    PT Treatment/Interventions ADLs/Self Care Home Management;Electrical Stimulation;Iontophoresis 37m/ml Dexamethasone;Moist Heat;Neuromuscular re-education;Therapeutic exercise;Therapeutic activities;Patient/family education;Balance training;Manual techniques;Dry needling;Passive range of  motion;Vestibular    PT Next Visit Plan assess and progress as tolerated. follow up on any new asthma attacks    Consulted and Agree with Plan of Care Patient             Patient will benefit from skilled therapeutic intervention in order to improve the following deficits and impairments:  Abnormal gait, Decreased range of motion, Difficulty walking, Increased  muscle spasms, Decreased activity tolerance, Pain, Hypomobility, Impaired flexibility, Decreased mobility, Decreased strength, Postural dysfunction  Visit Diagnosis: Chronic bilateral low back pain without sciatica  Muscle weakness (generalized)  Cramp and spasm  Difficulty in walking, not elsewhere classified     Problem List Patient Active Problem List   Diagnosis Date Noted   Lumbar back pain 05/28/2021   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   Daytime somnolence 04/06/2019   Lumbar radiculopathy 07/26/2016   Depression 09/19/2015   Preventative health care 06/18/2014   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 [place], [place], [address] Phone: [phone]   Fax:  [phone]  Name: Kathleen [name] MRN: [id number] Date of Birth: 1961-02-23

## 2021-07-22 ENCOUNTER — Ambulatory Visit: Payer: Medicaid Other | Admitting: Physical Therapy

## 2021-07-27 ENCOUNTER — Ambulatory Visit: Payer: Medicaid Other

## 2021-07-30 ENCOUNTER — Ambulatory Visit: Payer: Medicaid Other

## 2021-08-03 ENCOUNTER — Encounter: Payer: Self-pay | Admitting: Physical Therapy

## 2021-08-03 ENCOUNTER — Other Ambulatory Visit: Payer: Self-pay

## 2021-08-03 ENCOUNTER — Ambulatory Visit: Payer: Medicaid Other | Admitting: Physical Therapy

## 2021-08-03 VITALS — HR 82

## 2021-08-03 DIAGNOSIS — R262 Difficulty in walking, not elsewhere classified: Secondary | ICD-10-CM

## 2021-08-03 DIAGNOSIS — R252 Cramp and spasm: Secondary | ICD-10-CM

## 2021-08-03 DIAGNOSIS — M6281 Muscle weakness (generalized): Secondary | ICD-10-CM

## 2021-08-03 DIAGNOSIS — M545 Low back pain, unspecified: Secondary | ICD-10-CM | POA: Diagnosis not present

## 2021-08-03 DIAGNOSIS — G8929 Other chronic pain: Secondary | ICD-10-CM

## 2021-08-03 NOTE — Therapy (Signed)
[place]. [place], [place], [address] Phone: [phone]   Fax:  [phone]  Physical Therapy Treatment  Patient Details  Name: Kathleen [name] MRN: [id number] Date of Birth: 01-Dec-1961 Referring Provider (PT): Lauro Franklin Date: 08/03/2021   PT End of Session - 08/03/21 1657     Visit Number 5    Date for PT Re-Evaluation 09/09/21    PT Start Time 1629   pt late   PT Stop Time 1710    PT Time Calculation (min) 41 min    Activity Tolerance Patient limited by pain    Behavior During Therapy [facility] for tasks assessed/performed             Past Medical History:  Diagnosis Date   Anxiety    Arthritis    hands   Asthma    2 sets of PFT's in 04/09 without sign variability. Last set with significant decrease in FEV1 with saline alone, suggesting Asthma but  recommended clinical corelation    Asthma    Bipolar 1 disorder [facility])    therapist is Joy and is followed by [facility]   Blackout    negative work up including ESR, ANA, opthalmology referral, carotid dopplers, 2D echo, MRI and EEG.   BREAST LUMP 03/25/2008   Annotation: bilaterally Qualifier: Diagnosis of  By: Tomasa Hosteller MD, Edmon Crape.    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 Further work up includes CRP, ESR, SPEP/UPEP, hepatitis erology, A1C , repeat ANA   COPD (chronic obstructive pulmonary disease) ([place])    COPD exacerbation ([place]) 03/31/2019   Depression    DUB (dysfunctional uterine bleeding)    and pelvic pain, with negative endometrial bx in 07/09 and transvaginal U/S significant for mild fibroids in 0/09.   Emphysema of lung ([place])     GERD (gastroesophageal reflux disease)    Hallucinations 03/18/2008   Qualifier: Diagnosis of  By: Redmond Pulling  MD, Mateo Flow     Hyperlipidemia    Lower extremity edema    Neg ABI's, normal 2D echo, normal albumin   Menorrhagia    Ovarian cyst    Polysubstance abuse ([place])    none since March 17,2009.   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 Having troube breathing and speaking. Been taking her breathing treatments.    Pertinent History  bipolar, chronic LBP, reports of falls/vertigo, asthma, COPD    Diagnostic tests xrays lower back, states getting MRI of head Friday 06/19/21 (has been having some vertigo/falls recently)    Patient Stated Goals reduce LBP    Currently in Pain? Yes    Pain Score 10-Worst pain ever    Pain Location Back    Pain Orientation Right;Left;Lower    Pain Descriptors / Indicators Aching;Sharp    Pain Type Chronic pain                               [facility] Adult PT Treatment/Exercise - 08/03/21 0001       Lumbar Exercises: Stretches   Lower Trunk Rotation --   10x to tolerance   Pelvic Tilt 10 reps    Pelvic Tilt Limitations limited anterior pelvic tilt    Other Lumbar Stretch Exercise prayer stretch with pball 10x5"   cues to avoid pushing into pain     Lumbar Exercises: Supine   Bridge 10 reps    Bridge Limitations very limited ROM      Lumbar Exercises: Sidelying   Hip Abduction Right;Left    Hip Abduction Limitations 5x each with  very limited ROM   c/o LBP   Other Sidelying Lumbar Exercises R/L open book stretch 10x each   ROM to tolerance     Moist Heat Therapy   Number Minutes Moist Heat 15 Minutes    Moist Heat Location Lumbar Spine      Electrical Stimulation   Electrical Stimulation Location LBP    Electrical Stimulation Action pre-mod    Electrical Stimulation Parameters prone; output to tolerance; 15 min    Electrical Stimulation Goals Pain                    PT Education - 08/03/21 1703     Education Details advised patient to schedule appointment with her PCP d/t report of SOB and no benefit from breathing treatments    Person(s) Educated Patient    Methods Explanation    Comprehension Verbalized understanding              PT Short Term Goals - 06/26/21 1044       PT SHORT TERM GOAL #1   Title Pt will be I with initial HEP    Status Achieved               PT Long Term Goals - 08/03/21 1752       PT LONG TERM GOAL #1   Title Pt will be I with advanced HEP    Time 8    Period Weeks    Status On-going      PT LONG TERM GOAL #2   Title Pt will report 50% reduced LBP    Time 8    Period Weeks    Status On-going      PT LONG TERM GOAL #3   Title Pt will report able to perform ADLs with </= 2/10 LBP    Time 8    Period Weeks    Status On-going      PT LONG TERM GOAL #4   Title Pt will report able to sit >30 min with no increase in LBP    Time 8    Period Weeks    Status On-going  Plan - 08/03/21 1658     Clinical Impression Statement Patient arrived to session with report of being sick with asthmatic bronchitis for the past 2 days, however patient chart reveals ED visit for SOB and asthma exacerbation on 06/30/21. Patient noting that her breathing treatments are not helping, thus advised patient to make an appointment to discuss this with her PCP. Vitals WFL, however patient intermittently with slightly quick respiration rate, thus  monitored symptoms but proceeded with session. Patient reported 10/10 pain at start of session today. Performed gentle lumbopelvic ROM and core/hip strengthening to tolerance. Very limited hip ROM was evident with hip strengthening, limited by weakness/pain. Patient requested e-stim and heat mid-session d/t pain levels and poor tolerance for ther-ex. Upon removal of electrodes, patient noted itching and remarked on the fact that heat "breaks me out." Did observe redness along thoracic paraspinals but no changes appeared under electrodes themselves. Patient noted improved pain at end of session.    Comorbidities bipolar, reports of falls/vertigo, asthma, COPD    PT Treatment/Interventions ADLs/Self Care Home Management;Electrical Stimulation;Iontophoresis 37m/ml Dexamethasone;Moist Heat;Neuromuscular re-education;Therapeutic exercise;Therapeutic activities;Patient/family education;Balance training;Manual techniques;Dry needling;Passive range of motion;Vestibular    PT Next Visit Plan assess and progress as tolerated. follow up on any new asthma attacks    Consulted and Agree with Plan of Care Patient             Patient will benefit from skilled therapeutic intervention in order to improve the following deficits and impairments:  Abnormal gait, Decreased range of motion, Difficulty walking, Increased muscle spasms, Decreased activity tolerance, Pain, Hypomobility, Impaired flexibility, Decreased mobility, Decreased strength, Postural dysfunction  Visit Diagnosis: Chronic bilateral low back pain without sciatica  Muscle weakness (generalized)  Cramp and spasm  Difficulty in walking, not elsewhere classified     Problem List Patient Active Problem List   Diagnosis Date Noted   Lumbar back pain 05/28/2021   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

## 2021-08-05 ENCOUNTER — Ambulatory Visit: Payer: Medicaid Other | Admitting: Physical Therapy

## 2021-08-20 ENCOUNTER — Ambulatory Visit: Payer: Medicaid Other | Admitting: Pulmonary Disease

## 2021-08-24 ENCOUNTER — Ambulatory Visit: Payer: Medicaid Other | Admitting: Internal Medicine

## 2021-08-24 ENCOUNTER — Telehealth: Payer: Self-pay | Admitting: Internal Medicine

## 2021-08-24 NOTE — Progress Notes (Deleted)
  [facility] Internal Medicine Residency Telephone Encounter Continuity Care Appointment  HPI:  This telephone encounter was created for Ms. Katrine Coho on 08/24/2021 for the following purpose/cc ***.   Past Medical History:  Past Medical History:  Diagnosis Date   Anxiety    Arthritis    hands   Asthma    2 sets of PFT's in 04/09 without sign variability. Last set with significant decrease in FEV1 with saline alone, suggesting Asthma but  recommended clinical corelation    Asthma    Bipolar 1 disorder [facility])    therapist is Joy and is followed by [facility]   Blackout    negative work up including ESR, ANA, opthalmology referral, carotid dopplers, 2D echo, MRI and EEG.   BREAST LUMP 03/25/2008   Annotation: bilaterally Qualifier: Diagnosis of  By: Tomasa Hosteller MD, Edmon Crape.    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 Further work up includes CRP, ESR, SPEP/UPEP, hepatitis erology, A1C , repeat ANA   COPD (chronic obstructive pulmonary disease) ([place])    COPD exacerbation ([place]) 03/31/2019   Depression    DUB (dysfunctional uterine bleeding)    and pelvic pain, with negative endometrial bx in 07/09 and transvaginal U/S significant for mild fibroids in 0/09.   Emphysema of lung ([place])    GERD (gastroesophageal reflux disease)    Hallucinations 03/18/2008   Qualifier: Diagnosis of  By: Redmond Pulling  MD, Mateo Flow     Hyperlipidemia    Lower extremity edema    Neg ABI's, normal 2D echo, normal albumin   Menorrhagia    Ovarian cyst    Polysubstance abuse ([place])    none since March 17,2009.   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  As always, pt is advised that if symptoms worsen or new symptoms arise, they should go to an urgent care facility or to to ER for further evaluation.   Consent and Medical Decision Making:  Patient {GC/GE:3044014::"discussed with","seen with"} Dr. {NAMES:3044014::"Guilloud","E. Hoffman","Mullen","Narendra","Raines","Vincent","Williams"} This is a telephone encounter between NEKEYA BRISKI and Mike Craze on 08/24/2021 for ***. The visit was conducted with the patient located at {NAMES:3044014::"home"} and Laurrie Toppin N [address]ary Sosa at {NAMES:3044014::"[facility]","Home"}. The patient's identity was confirmed using their DOB and current address. The {WHO:3044014::"patient","his/her legal guardian","***"} has consented to being evaluated through a telephone encounter and understands the associated risks (an examination cannot be done and the patient may need to come in for an appointment) / benefits (allows the patient to remain at home, decreasing exposure to coronavirus). I personally spent {Numbers; [phone]} minutes on medical discussion.

## 2021-08-24 NOTE — Telephone Encounter (Signed)
Attempted calling patient x2, no answer. Left voicemail

## 2021-08-26 ENCOUNTER — Ambulatory Visit (INDEPENDENT_AMBULATORY_CARE_PROVIDER_SITE_OTHER): Payer: Medicaid Other | Admitting: Student

## 2021-08-26 DIAGNOSIS — R1032 Left lower quadrant pain: Secondary | ICD-10-CM

## 2021-08-26 DIAGNOSIS — K219 Gastro-esophageal reflux disease without esophagitis: Secondary | ICD-10-CM | POA: Diagnosis not present

## 2021-08-26 DIAGNOSIS — D492 Neoplasm of unspecified behavior of bone, soft tissue, and skin: Secondary | ICD-10-CM

## 2021-08-26 DIAGNOSIS — R300 Dysuria: Secondary | ICD-10-CM | POA: Diagnosis not present

## 2021-08-26 DIAGNOSIS — J449 Chronic obstructive pulmonary disease, unspecified: Secondary | ICD-10-CM

## 2021-08-26 DIAGNOSIS — R238 Other skin changes: Secondary | ICD-10-CM

## 2021-08-27 ENCOUNTER — Other Ambulatory Visit: Payer: Self-pay | Admitting: *Deleted

## 2021-08-27 ENCOUNTER — Telehealth: Payer: Self-pay | Admitting: Pharmacist

## 2021-08-27 ENCOUNTER — Ambulatory Visit (INDEPENDENT_AMBULATORY_CARE_PROVIDER_SITE_OTHER): Payer: Medicaid Other | Admitting: Pulmonary Disease

## 2021-08-27 ENCOUNTER — Other Ambulatory Visit: Payer: Medicaid Other

## 2021-08-27 ENCOUNTER — Other Ambulatory Visit: Payer: Self-pay

## 2021-08-27 ENCOUNTER — Encounter: Payer: Self-pay | Admitting: Pulmonary Disease

## 2021-08-27 VITALS — BP 118/76 | HR 73 | Temp 98.1°F | Ht 63.0 in | Wt 156.6 lb

## 2021-08-27 DIAGNOSIS — J449 Chronic obstructive pulmonary disease, unspecified: Secondary | ICD-10-CM

## 2021-08-27 DIAGNOSIS — J4489 Other specified chronic obstructive pulmonary disease: Secondary | ICD-10-CM

## 2021-08-27 DIAGNOSIS — R300 Dysuria: Secondary | ICD-10-CM

## 2021-08-27 DIAGNOSIS — R0609 Other forms of dyspnea: Secondary | ICD-10-CM

## 2021-08-27 DIAGNOSIS — R0600 Dyspnea, unspecified: Secondary | ICD-10-CM

## 2021-08-27 MED ORDER — ALBUTEROL SULFATE (2.5 MG/3ML) 0.083% IN NEBU: 2.5000 mg | INHALATION_SOLUTION | RESPIRATORY_TRACT | 2 refills | Status: DC | PRN

## 2021-08-27 MED ORDER — PANTOPRAZOLE SODIUM 40 MG PO TBEC: 40.0000 mg | DELAYED_RELEASE_TABLET | Freq: Every day | ORAL | 1 refills | Status: DC

## 2021-08-27 MED ORDER — EPINEPHRINE 0.3 MG/0.3ML IJ SOAJ: 0.3000 mg | INTRAMUSCULAR | 3 refills | Status: DC | PRN

## 2021-08-27 NOTE — Progress Notes (Signed)
Xolair BIV started in separate telephone encounter where f/u will occur. Dose would be '375mg'$  every 2 weeks  Knox Saliva, PharmD, MPH, BCPS Clinical Pharmacist (Rheumatology and Pulmonology)

## 2021-08-27 NOTE — Telephone Encounter (Signed)
Please start Xolair BIV.  Dose: '375mg'$  every 14 days (max as patient's weight of 71kg is > 60kg)  Dx: J44.9  IgE on 05/20/21 - 2682 Abs eosinophils on 05/20/21 - 600  Previously tried therapies: Dulera, fluticasone nasal spray, montelukast  Knox Saliva, PharmD, MPH, BCPS Clinical Pharmacist (Rheumatology and Pulmonology)

## 2021-08-27 NOTE — Patient Instructions (Signed)
Nice to see you again, I am sorry the breathing is not doing well  I think is time to start an injection for your asthma.  We will start the process for Xolair.  Given how high her IgE is I am confident the dose will be every 2 weeks.  We will do the first dose in clinic and monitor you.  Teach you how to use the autoinjector.  Then you can do subsequent doses at home.  I hopeful we can get this process taken care of in a couple weeks.  I sent a refill for your EpiPen.  There is a small chance of having anaphylaxis or severe allergic reaction to the Xolair.  Return to clinic in 3 months or sooner as needed r

## 2021-08-27 NOTE — Progress Notes (Signed)
'@Patient'  ID: Kathleen [name], female    DOB: 1961/03/30, 60 y.o.   MRN: [id number]  Chief Complaint  Patient presents with   Follow-up    3 mo f/u for DOE. States she is still having SOB episodes. Wants an order for a nebulizer mask, is not currently established with a DME company.     Referring provider: Riesa Pope, *  HPI:   60 y.o. whom we are seeing in follow-up of dyspnea exertion felt to be related to poorly controlled asthma.  PCP note reviewed.  ED note 06/30/2021 reviewed.  Overall, breathing is poorly controlled.  Still significant dyspnea exertion walking flat surfaces over short distances.  Hoarse voice.  Cough.  Reports good adherence to high-dose ICS/LABA as well as Spiriva.  Reviewed in detail her PFTs at bedtime 07/17/21 that demonstrates normal spirometry, significant bronchodilator response in both FEV1 and FVC, normal lung volumes with the exception of evidence of gas trapping with elevated RV to TLC ratio, normal DLCO.  Discussed in detail the role of biologic therapy given her unresolved symptoms despite aggressive inhaler therapies.  She expressed understanding is eager to move forward.  HPI initial visit: Patient has longstanding history of dyspnea exertion, wheeze, chest tightness.  Worse with temperature changes, hot and cold.  Unclear if seasonal changes,, allergies, pollens contribute.  She seemed to indicate breathing does tend to get worse when her allergy symptoms flare.  She has significant atopic symptoms including anaphylaxis to bee stings.  Breaks out in with the sound like hives or eczema to multiple different triggers.  No time during day when her symptoms dyspnea is better or worse.  Uses Dulera which seems to be the most effective inhaled agent for her symptoms.  In addition she takes [place] daily, Qvar, Symbicort, Anoro and uses as needed albuterol.  Mild cough.  No environmental changes that she can identify the triggers breathing elsewise.  No other  alleviating or exacerbating factors.  3 most recent chest x-rays personally reviewed and all interpreted as clear lungs, no effusion, pneumothorax, infiltrate.  Overall look the same.  PMH: Asthma, hyperlipidemia, seasonal allergies, headaches, sleep apnea Surgical history: Reviewed with patient, denies any Family history: First relatives with emphysema, seasonal allergies, asthma, cancer Social history: Lives in [place], former cigarette smoking, approximately 40-pack-year history, used to smoke marijuana but quit several months ago  Licensed conveyancer / Pulmonary Flowsheets:   ACT:  No flowsheet data found.  MMRC: mMRC Dyspnea Scale mMRC Score  08/27/2021 1    Epworth:  No flowsheet data found.  Tests:   FENO:  No results found for: NITRICOXIDE  PFT: PFT Results Latest Ref Rng & Units 07/17/2021  FVC-Pre L 2.32  FVC-Predicted Pre % 95  FVC-Post L 2.61  FVC-Predicted Post % 107  Pre FEV1/FVC % % 70  Post FEV1/FCV % % 70  FEV1-Pre L 1.62  FEV1-Predicted Pre % 84  FEV1-Post L 1.83  DLCO uncorrected ml/min/mmHg 15.06  DLCO UNC% % 79  DLCO corrected ml/min/mmHg 14.44  DLCO COR %Predicted % 76  DLVA Predicted % 74  TLC L 5.45  TLC % Predicted % 114  RV % Predicted % 130  Personally reviewed interpreted as normal spirometry, significant bronchodilator response in both FEV1 and FVC, TLC within the limits, gas trapping given RV to TLC ratio is elevated, normal DLCO.  Consistent with asthma.  WALK:  No flowsheet data found.  Imaging: Personally reviewed and as per EMR discussion this note   Lab Results: Personally  reviewed, IgE 2606 June 2022, eosinophils 600 June 2022 CBC    Component Value Date/Time   WBC 8.1 06/30/2021 1858   RBC 5.33 (H) 06/30/2021 1858   HGB 14.9 06/30/2021 1858   HGB 13.6 08/09/2019 1538   HCT 46.7 (H) 06/30/2021 1858   HCT 41.5 08/09/2019 1538   PLT 353 06/30/2021 1858   PLT 355 08/09/2019 1538   MCV 87.6 06/30/2021 1858   MCV 86  08/09/2019 1538   MCH 28.0 06/30/2021 1858   MCHC 31.9 06/30/2021 1858   RDW 13.4 06/30/2021 1858   RDW 12.5 08/09/2019 1538   LYMPHSABS 2.3 05/15/2021 1145   LYMPHSABS 2.9 08/09/2019 1538   MONOABS 0.6 05/15/2021 1145   EOSABS 0.6 (H) 05/15/2021 1145   EOSABS 0.2 08/09/2019 1538   BASOSABS 0.0 05/15/2021 1145   BASOSABS 0.1 08/09/2019 1538    BMET    Component Value Date/Time   NA 142 06/30/2021 1858   NA 141 08/09/2019 1538   K 5.1 06/30/2021 1858   CL 106 06/30/2021 1858   CO2 27 06/30/2021 1858   GLUCOSE 73 06/30/2021 1858   BUN 18 06/30/2021 1858   BUN 14 08/09/2019 1538   CREATININE 1.38 (H) 06/30/2021 1858   CREATININE 1.01 05/24/2014 1140   CALCIUM 9.1 06/30/2021 1858   GFRNONAA 44 (L) 06/30/2021 1858   GFRNONAA 64 05/24/2014 1140   GFRAA >60 08/14/2020 1102   GFRAA 73 05/24/2014 1140    BNP    Component Value Date/Time   BNP 28.2 08/14/2020 1102    ProBNP    Component Value Date/Time   PROBNP 69.0 04/29/2008 1933    Specialty Problems       Pulmonary Problems   Asthma-COPD overlap syndrome (HCC)    Allergies  Allergen Reactions   Bee Venom    Peach [Prunus Persica] Other (See Comments)    Throat swells , breakout   Topiramate Other (See Comments)    Reaction:  Hallucinations    Tramadol Nausea Only    Immunization History  Administered Date(s) Administered   Influenza Whole 10/08/2008, 11/25/2009   PFIZER(Purple Top)SARS-COV-2 Vaccination 03/20/2020, 04/10/2020   Pneumococcal Polysaccharide-23 11/25/2009   Td 11/25/2009   Tdap 07/08/2016    Past Medical History:  Diagnosis Date   Anxiety    Arthritis    hands   Asthma    2 sets of PFT's in 04/09 without sign variability. Last set with significant decrease in FEV1 with saline alone, suggesting Asthma but  recommended clinical corelation    Asthma    Bipolar 1 disorder [facility])    therapist is Joy and is followed by [facility]   Blackout    negative work up including  ESR, ANA, opthalmology referral, carotid dopplers, 2D echo, MRI and EEG.   BREAST LUMP 03/25/2008   Annotation: bilaterally Qualifier: Diagnosis of  By: Tomasa Hosteller MD, Edmon Crape.    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 Further work up includes CRP, ESR, SPEP/UPEP, hepatitis erology, A1C , repeat ANA   COPD (chronic obstructive pulmonary disease) ([place])    COPD exacerbation ([place]) 03/31/2019  Depression    DUB (dysfunctional uterine bleeding)    and pelvic pain, with negative endometrial bx in 07/09 and transvaginal U/S significant for mild fibroids in 0/09.   Emphysema of lung ([place])    GERD (gastroesophageal reflux disease)    Hallucinations 03/18/2008   Qualifier: Diagnosis of  By: Redmond Pulling  MD, Mateo Flow     Hyperlipidemia    Lower extremity edema    Neg ABI's, normal 2D echo, normal albumin   Menorrhagia    Ovarian cyst    Polysubstance abuse ([place])    none since March 17,2009.   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 Wants Nicotine Patches    Counseling given: Not Answered Tobacco comments: Occ. Wants Nicotine Patches    Continue to not smoke  Outpatient Encounter Medications as of 08/27/2021  Medication Sig   albuterol (PROVENTIL) (2.5 MG/3ML) 0.083% nebulizer solution Take 3 mLs (2.5 mg total) by nebulization every 4 (four) hours as needed  for wheezing or shortness of breath.   ASPIRIN ADULT LOW STRENGTH 81 MG EC tablet TAKE 1 Tablet BY MOUTH ONCE DAILY (Patient taking differently: Take 81 mg by mouth daily.)   DULERA 200-5 MCG/ACT AERO Inhale 2 puffs into the lungs in the morning and at bedtime.   EPINEPHrine (EPIPEN 2-PAK) 0.3 mg/0.3 mL IJ SOAJ injection Inject 0.3 mg into the muscle as needed for anaphylaxis.   fluticasone (FLONASE) 50 MCG/ACT nasal spray Place 1 spray into both nostrils daily.   HYDROcodone-acetaminophen (NORCO/VICODIN) 5-325 MG tablet Take 1 tablet by mouth every 4 (four) hours as needed.   lidocaine (LIDODERM) 5 % Place 1 patch onto the skin daily. Remove & Discard patch within 12 hours or as directed by MD   loratadine (CLARITIN) 10 MG tablet Take 1 tablet (10 mg total) by mouth daily.   montelukast (SINGULAIR) 10 MG tablet Take 1 tablet (10 mg total) by mouth at bedtime.   ondansetron (ZOFRAN) 4 MG tablet TAKE 1 TABLET BY MOUTH ONCE DAILY AS NEEDED FOR NAUSEA FOR VOMITING   pantoprazole (PROTONIX) 40 MG tablet Take 1 tablet (40 mg total) by mouth daily.   PROAIR HFA 108 (90 Base) MCG/ACT inhaler INHALE 1 TO 2 PUFFS BY MOUTH EVERY 6 HOURS AS NEEDED FOR WHEEZING FOR SHORTNESS OF BREATH   tiotropium (SPIRIVA HANDIHALER) 18 MCG inhalation capsule Place 1 capsule (18 mcg total) into inhaler and inhale daily.   [DISCONTINUED] EPINEPHrine 0.3 mg/0.3 mL IJ SOAJ injection Inject 0.3 mg into the muscle as needed for anaphylaxis.   [DISCONTINUED] divalproex (DEPAKOTE ER) 500 MG 24 hr tablet Take 1 tablet (500 mg total) by mouth 2 (two) times daily. (Patient not taking: Reported on 10/08/2019)   [DISCONTINUED] ipratropium-albuterol (DUONEB) 0.5-2.5 (3) MG/3ML SOLN Take 3 mLs by nebulization every 6 (six) hours as needed (wheezing or shortness of breath). (Patient not taking: Reported on 03/19/2020)   [DISCONTINUED] metoCLOPramide (REGLAN) 5 MG tablet Take 1 tablet (5 mg total) by mouth 3 (three) times daily before meals for  5 days. (Patient not taking: Reported on 03/19/2020)   [  DISCONTINUED] predniSONE (STERAPRED UNI-PAK 21 TAB) 10 MG (21) TBPK tablet Take by mouth daily. Take 6 tabs by mouth daily  for 2 days, then 5 tabs for 2 days, then 4 tabs for 2 days, then 3 tabs for 2 days, 2 tabs for 2 days, then 1 tab by mouth daily for 2 days   [DISCONTINUED] SUMAtriptan (IMITREX) 25 MG tablet Take 1 tablet (25 mg total) by mouth every 2 (two) hours as needed for migraine or headache. (Patient not taking: Reported on 10/08/2019)   No facility-administered encounter medications on file as of 08/27/2021.     Review of Systems  Review of Systems  N/a Physical Exam  BP 118/76 (BP Location: Right Arm, Patient Position: Sitting, Cuff Size: Normal)   Pulse 73   Temp 98.1 F (36.7 C) (Oral)   Ht '5\' 3"'  (1.6 m)   Wt 156 lb 9.6 oz (71 kg)   LMP 11/08/2014   SpO2 99% Comment: on RA  BMI 27.74 kg/m   Wt Readings from Last 5 Encounters:  08/27/21 156 lb 9.6 oz (71 kg)  05/27/21 155 lb (70.3 kg)  05/20/21 157 lb (71.2 kg)  05/15/21 150 lb (68 kg)  04/02/21 158 lb 8 oz (71.9 kg)    BMI Readings from Last 5 Encounters:  08/27/21 27.74 kg/m  05/27/21 27.46 kg/m  05/20/21 27.81 kg/m  05/15/21 26.57 kg/m  04/02/21 28.08 kg/m     Physical Exam General: Well-appearing, in no acute distress Eyes: EOMI, no icterus Neck: Supple, no JVP Cardiovascular: Regular rhythm, no murmur Pulmonary: Clear to auscultation bilaterally, and expiratory wheeze bilaterally Abdomen: Nondistended, bowel sounds present MSK: No synovitis, no joint effusion Neuro: Normal gait, no weakness, hoarse voice Psych: Normal mood, full affect   Assessment & Plan:   Dyspnea on exertion: Suspect related to COPD and asthma overlap.  Highest concern for uncontrolled asthma given her significant atopic and allergic symptoms.  PFTs consistent with asthma.  See below for escalation of therapy.  COPD/asthma overlap: FEV1/FVC ratio borderline at  70% in 2009.  Similar recently 07/2021 with significant bronchodilator response.  Poorly controlled symptom  Continue Dulera and Spiriva as prescribed for triple inhaled therapy.  Continue ProAir as needed.  Eosinophils chronically elevated most recently 600.  Recent IgE 2600.  Given elevation of IgE out of portion eosinophils as well as significant histaminergic symptoms such as hives etc., recommend starting Xolair.  EpiPen refilled today.  Paperwork today.  Anticipate starting in the coming weeks.  Will require intensive drug monitoring for first dose.  She gives her self injections at home so confident she can do autoinjector after first injection.  Esophageal stasis/dysmotility: At risk for reflux, microaspiration which can contribute to poorly controlled asthma.   Return in about 3 months (around 11/26/2021).   Lanier Clam, MD 08/27/2021  I spent 45 minutes in the care of this patient including review of records, face-to-face visit, coordination of care.

## 2021-08-28 ENCOUNTER — Other Ambulatory Visit: Payer: Self-pay | Admitting: Student

## 2021-08-28 DIAGNOSIS — R300 Dysuria: Secondary | ICD-10-CM | POA: Insufficient documentation

## 2021-08-28 DIAGNOSIS — N6002 Solitary cyst of left breast: Secondary | ICD-10-CM

## 2021-08-28 DIAGNOSIS — D492 Neoplasm of unspecified behavior of bone, soft tissue, and skin: Secondary | ICD-10-CM | POA: Insufficient documentation

## 2021-08-28 LAB — URINALYSIS, ROUTINE W REFLEX MICROSCOPIC
Bilirubin, UA: NEGATIVE
Glucose, UA: NEGATIVE
Ketones, UA: NEGATIVE
Leukocytes,UA: NEGATIVE
Nitrite, UA: NEGATIVE
Protein,UA: NEGATIVE
RBC, UA: NEGATIVE
Specific Gravity, UA: 1.026 (ref 1.005–1.030)
Urobilinogen, Ur: 0.2 mg/dL (ref 0.2–1.0)
pH, UA: 5.5 (ref 5.0–7.5)

## 2021-08-28 NOTE — Assessment & Plan Note (Signed)
Patient states she had been feeling more short of breath while at the beach last week and required more of her albuterol than usual.  She would like a refill on her albuterol nebulizer solution.  Appears that Dr. Silas Flood has recently refilled all her other inhalers.  Today she does not feel more short of breath denies wheezing, cough and increased sputum production.  She is also requesting to have a mask rather than a mouth piece for her nebulizer device.  She has pulmonology follow-up tomorrow September 15.  Plan Refilled albuterol DME order placed for nebulizer mask Follow-up with pulmonology

## 2021-08-28 NOTE — Progress Notes (Signed)
   CC: Asthma/COPD follow up, medication refill, skin growth under left eye, dysuria  This is a telephone encounter between DARCEY CARDY and [name] on 08/28/2021 for medication refills, dysuria, and skin growth under her left eye. The visit was conducted with the patient located at home and [name] at [facility]. The patient's identity was confirmed using their DOB and current address. The patient has consented to being evaluated through a telephone encounter and understands the associated risks (an examination cannot be done and the patient may need to come in for an appointment) / benefits (allows the patient to remain at home, decreasing exposure to coronavirus). I personally spent 25 minutes on medical discussion.   HPI:  Ms.Deneane Wanda Plump Landers is a 60 y.o. with PMH as below.   Please see A&P for assessment of the patient's acute and chronic medical conditions.   Past Medical History:  Diagnosis Date   Anxiety    Arthritis    hands   Asthma    2 sets of PFT's in 04/09 without sign variability. Last set with significant decrease in FEV1 with saline alone, suggesting Asthma but  recommended clinical corelation    Asthma    Bipolar 1 disorder [facility])    therapist is Joy and is followed by [facility]   Blackout    negative work up including ESR, ANA, opthalmology referral, carotid dopplers, 2D echo, MRI and EEG.   BREAST LUMP 03/25/2008   Annotation: bilaterally Qualifier: Diagnosis of  By: Tomasa Hosteller MD, Edmon Crape.    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 Further work up includes CRP, ESR, SPEP/UPEP, hepatitis erology, A1C , repeat ANA   COPD (chronic obstructive pulmonary  disease) ([place])    COPD exacerbation ([place]) 03/31/2019   Depression    DUB (dysfunctional uterine bleeding)    and pelvic pain, with negative endometrial bx in 07/09 and transvaginal U/S significant for mild fibroids in 0/09.   Emphysema of lung ([place])    GERD (gastroesophageal reflux disease)    Hallucinations 03/18/2008   Qualifier: Diagnosis of  By: Redmond Pulling  MD, Mateo Flow     Hyperlipidemia    Lower extremity edema    Neg ABI's, normal 2D echo, normal albumin   Menorrhagia    Ovarian cyst    Polysubstance abuse ([place])    none since March 17,2009.   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  Patient discussed with Dr. Daryll Drown

## 2021-08-28 NOTE — Assessment & Plan Note (Addendum)
Patient reports dysuria for the past several days.  States there is slight pinkness to her urine when she urinates and also is having left-sided flank pain.  She does have a history of kidney stones.  She denies fevers or chills.  She feels like she is urinating a little bit more frequent than usual.  She has not noticed passing any stones. her symptoms seem consistent with possible urinary stone.    Plan Tylenol as needed for pain Instruct patient to remain well-hydrated We will have her come to clinic to provide urine sample for UA and urine culture Instructed her to make a in person visit in about 1 week if symptoms are worsening or do not improve or she develops fevers chills  Addendum UA appears unremarkable

## 2021-08-28 NOTE — Telephone Encounter (Addendum)
Submitted a Prior Authorization request to [facility] for XOLAIR '75mg'$  and '150mg'$  via CoverMyMeds. Will update once we receive a response.  '150mg'$ - Key: [id number] - PA Case ID: [id number]   '75mg'$ - (KeyZE:[id number]) [id number]

## 2021-08-28 NOTE — Assessment & Plan Note (Addendum)
Patient reports that there is a skin growth under her left eye along her lower eyelid.  States this has been present for quite a long time.  She feels that this growth is been increasing in size recently and constantly sees out of the corner of her eye.  She denies any eye pain, drainage, or visual changes.  Unable to evaluate the skin growth over the phone at today's visit.  Will place referral to dermatology for further evaluation and possible removal.

## 2021-08-31 ENCOUNTER — Other Ambulatory Visit: Payer: Self-pay | Admitting: Student

## 2021-08-31 ENCOUNTER — Other Ambulatory Visit (HOSPITAL_COMMUNITY): Payer: Self-pay

## 2021-08-31 DIAGNOSIS — N6002 Solitary cyst of left breast: Secondary | ICD-10-CM

## 2021-08-31 NOTE — Telephone Encounter (Signed)
Forwarding back, in case I accidentally closed this message out.

## 2021-08-31 NOTE — Telephone Encounter (Signed)
Received notification from [facility] regarding a prior authorization for XOLAIR 75 and 150mg . Authorization has been APPROVED from 08/28/21 to 08/28/22.   150mg - Authorization# [id number] 75mg - Authorization # [id number]  Plan Phone # [phone]

## 2021-09-01 ENCOUNTER — Other Ambulatory Visit (HOSPITAL_COMMUNITY): Payer: Self-pay

## 2021-09-01 LAB — URINE CULTURE

## 2021-09-01 NOTE — Telephone Encounter (Signed)
ATC patient to schedule Xolair new start - phone went straight to VM. Unable to reach. Left VM requesting return call to direct office number  Knox Saliva, PharmD, MPH, BCPS Clinical Pharmacist (Rheumatology and Pulmonology)

## 2021-09-01 NOTE — Telephone Encounter (Signed)
Per test claim, copay for 28 days supply is $4.00 each150mg  & 75mg )  Patient can fill through [facility]: [phone]   Test claim also reveals that [place] Medicaid has patient's DoB incorrect on their end (listed as 1961). Patient will need to reach out to them directly in order to have this issue resolved and avoid potential issues in the future.   DUR can be overridden using option [id number] and typing in 11/09/1960 allowing successful billing until DoB can be corrected.

## 2021-09-02 NOTE — Telephone Encounter (Signed)
Called patient to schedule Xolair new start. She states her brother just passed away and she would like return in 2 weeks to schedule. She is very interested on getting started as soon as possible but would like to wait for now. Expressed condolences to her and told her I'd reach back out in 2 weeks.  Knox Saliva, PharmD, MPH, BCPS Clinical Pharmacist (Rheumatology and Pulmonology)

## 2021-09-05 NOTE — Progress Notes (Signed)
Internal Medicine Clinic Attending ? ?Case discussed with Dr. Liang  At the time of the visit.  We reviewed the resident?s history and exam and pertinent patient test results.  I agree with the assessment, diagnosis, and plan of care documented in the resident?s note. ? ?

## 2021-09-21 ENCOUNTER — Other Ambulatory Visit (HOSPITAL_COMMUNITY): Payer: Self-pay

## 2021-09-21 ENCOUNTER — Telehealth: Payer: Self-pay | Admitting: Pulmonary Disease

## 2021-09-21 MED ORDER — OMALIZUMAB 75 MG/0.5ML ~~LOC~~ SOSY
75.0000 mg | PREFILLED_SYRINGE | SUBCUTANEOUS | 1 refills | Status: DC
Start: 2021-09-21 — End: 2022-08-17
  Filled 2021-09-21 – 2021-09-22 (×2): qty 1, 28d supply, fill #0
  Filled 2022-01-05: qty 1, 28d supply, fill #1

## 2021-09-21 MED ORDER — OMALIZUMAB 150 MG/ML ~~LOC~~ SOSY
300.0000 mg | PREFILLED_SYRINGE | SUBCUTANEOUS | 1 refills | Status: DC
  Filled 2021-09-21 – 2021-09-22 (×2): qty 4, 28d supply, fill #0
  Filled 2022-01-05: qty 4, 28d supply, fill #1

## 2021-09-21 NOTE — Telephone Encounter (Signed)
Will route to pharmacy to get her scheduled and refill the epipen.  Thanks

## 2021-09-21 NOTE — Telephone Encounter (Addendum)
Rx was sent to [facility] in Sept with 3 refills. Patient can go to pharmacy and refill. She needs New Start visit scheduled.

## 2021-09-21 NOTE — Telephone Encounter (Signed)
Rx for Xolair 375mg  sent to [facility] to be couriered to clinic. Patient does not have CC on file and will have to come to pharmacy to pay in cash.  Epipen being filled with [facility] currently with latest pick up date on Thursday, 09/24/21  Knox Saliva, PharmD, MPH, BCPS Clinical Pharmacist (Rheumatology and Pulmonology)

## 2021-09-21 NOTE — Telephone Encounter (Signed)
Called pharmacy - patient's insurance locks her into brand-name Epipen. Pharmacy states the latest they will be able to fill is Thursday. Called patient to notify.  Knox Saliva, PharmD, MPH, BCPS Clinical Pharmacist (Rheumatology and Pulmonology)

## 2021-09-22 ENCOUNTER — Other Ambulatory Visit (HOSPITAL_COMMUNITY): Payer: Self-pay

## 2021-09-22 ENCOUNTER — Telehealth: Payer: Self-pay | Admitting: *Deleted

## 2021-09-22 NOTE — Telephone Encounter (Signed)
Shipment set up to courier to clinic, by later this week.

## 2021-09-22 NOTE — Telephone Encounter (Signed)
PA request was received from (pharmacy): [facility] Phone:  [phone]   Fax:  [phone] Medication name and strength: Epipen0.3mg /0.64ml auto injector Ordering Provider: [place]  Was PA started with CMM?: yes If yes, please enter KEY: [id number] Medication tried and failed: NA Covered Alternatives: NA  PA sent to plan, time frame for approval / denial: 72 hour Routing to [place] for follow-up

## 2021-09-23 NOTE — Telephone Encounter (Signed)
Xolair received by clinic. Will call patient on Friday, 09/25/21 to schedule Xolair new start visit  Knox Saliva, PharmD, MPH, BCPS Clinical Pharmacist (Rheumatology and Pulmonology)

## 2021-09-25 ENCOUNTER — Telehealth: Payer: Self-pay

## 2021-09-25 NOTE — Telephone Encounter (Signed)
ATC patient to schedule Xolair new start but she requests return call on Monday, 09/28/21 to schedule because she has lost her voice and is not feeling well. She states that her Epipen is ready to be picked up from [place] without any issues.  Will call patient on Monday as requested  Knox Saliva, PharmD, MPH, BCPS Clinical Pharmacist (Rheumatology and Pulmonology)

## 2021-09-25 NOTE — Telephone Encounter (Signed)
Prior auth not needed. Patient needed brand-name Epipen filled per her [place] Medicaid restrictions. Closing encounter as this was reviewed with the patient already  Knox Saliva, PharmD, MPH, BCPS Clinical Pharmacist (Rheumatology and Pulmonology)

## 2021-09-25 NOTE — Telephone Encounter (Signed)
Will route to pharmacy since this is related to her Xolair.

## 2021-09-25 NOTE — Telephone Encounter (Signed)
Received paperwork stating Ventolin is now covered by insurance. Pharmacy requesting to send in Ventolin instead of pro-air.   MH please advise if okay to sent in ventolin in place of Pro-air. If okay will contact patient. Thanks :)

## 2021-09-28 MED ORDER — ALBUTEROL SULFATE HFA 108 (90 BASE) MCG/ACT IN AERS: 1.0000 | INHALATION_SPRAY | Freq: Four times a day (QID) | RESPIRATORY_TRACT | 6 refills | Status: DC | PRN

## 2021-09-28 NOTE — Telephone Encounter (Signed)
ATC patient to schedule Xolair new start and see how she's feeling since I spoke to her last week. Unable to reach. Left VM requesting return call with direct office number. Will f/u  Knox Saliva, PharmD, MPH, BCPS Clinical Pharmacist (Rheumatology and Pulmonology)

## 2021-09-28 NOTE — Telephone Encounter (Signed)
Yes, change to Ventolin ok. Thanks!

## 2021-09-28 NOTE — Telephone Encounter (Signed)
I have sent the ventolin HFA into the pharmacy for the pt.  Nothing further is needed.

## 2021-09-30 NOTE — Telephone Encounter (Signed)
New start Xolair scheduled for 10/06/21. Patient aware of Epipen requirement (she has picked up from pharmacy already). Patient aware of 2 hour monitoring period.  Knox Saliva, PharmD, MPH, BCPS Clinical Pharmacist (Rheumatology and Pulmonology)

## 2021-10-05 ENCOUNTER — Other Ambulatory Visit: Payer: Medicaid Other

## 2021-10-06 ENCOUNTER — Ambulatory Visit: Payer: Medicaid Other | Admitting: Pharmacist

## 2021-10-06 ENCOUNTER — Telehealth: Payer: Self-pay | Admitting: Pulmonary Disease

## 2021-10-06 ENCOUNTER — Encounter (HOSPITAL_COMMUNITY): Payer: Self-pay | Admitting: Emergency Medicine

## 2021-10-06 ENCOUNTER — Other Ambulatory Visit: Payer: Self-pay

## 2021-10-06 ENCOUNTER — Emergency Department (HOSPITAL_COMMUNITY)
Admission: EM | Admit: 2021-10-06 | Discharge: 2021-10-06 | Discharge status: Against Medical Advice | Payer: Medicaid Other | Attending: Emergency Medicine | Admitting: Emergency Medicine

## 2021-10-06 DIAGNOSIS — R0600 Dyspnea, unspecified: Secondary | ICD-10-CM | POA: Insufficient documentation

## 2021-10-06 DIAGNOSIS — R0602 Shortness of breath: Secondary | ICD-10-CM | POA: Diagnosis present

## 2021-10-06 DIAGNOSIS — Z5321 Procedure and treatment not carried out due to patient leaving prior to being seen by health care provider: Secondary | ICD-10-CM | POA: Insufficient documentation

## 2021-10-06 DIAGNOSIS — Z79899 Other long term (current) drug therapy: Secondary | ICD-10-CM

## 2021-10-06 DIAGNOSIS — I889 Nonspecific lymphadenitis, unspecified: Secondary | ICD-10-CM

## 2021-10-06 MED ORDER — AMOXICILLIN-POT CLAVULANATE 875-125 MG PO TABS: 1.0000 | ORAL_TABLET | Freq: Two times a day (BID) | ORAL | 0 refills | Status: AC

## 2021-10-06 NOTE — Telephone Encounter (Signed)
Called and spoke with patient. She stated that she did go to the ED as instructed by MH after her injection appt this afternoon. They advised her of the wait and that they did not have a room ready for her yet. She did not feel safe waiting in the lobby since a lot of the other patients had covid, therefore she left and went home. Her symptoms have not gotten worse since leaving the ED.   She stated that Needles offered to send in abx for her earlier today and she wants to know if he will do this. Pharmacy is [facility] on [address]   MH, can you please advise? Thanks!

## 2021-10-06 NOTE — Telephone Encounter (Signed)
Called patient but she did not answer. Left message for her to call back.  

## 2021-10-06 NOTE — Progress Notes (Signed)
Patient presented to [facility] Pulmonary pharmacy clinic today with her brother to receive first dose of Xolair. She reported swelling in her throat - which was visible once she removed mask. States she does feel pain when she swallows. States the swelling was worse in the morning when she woke up but has decreased since morning. States she did not have swelling last night. She does  No exposure to others with Strep. She did have her brother's funeral in the past 2 weeks but reports no exposure to others with similar symptoms. Her brother that is accompanying her today also reports no symptoms.  She does not report N/V. Used Epipen yesterday because she had itching/hives with no obvious trigger  Dr. Silas Flood completed physical exam and advised that he would be willing to prescribe her oral antibiotics to empirically cover for Strep but he recommended seeking care in ED for more immediate and comprehensive workup  She will plan to reach back out to the clinic once her throat swelling has resolved  Knox Saliva, PharmD, MPH, BCPS Clinical Pharmacist (Rheumatology and Pulmonology)

## 2021-10-06 NOTE — ED Provider Notes (Signed)
Emergency Medicine Provider Triage Evaluation Note  Kathleen [name] , a 60 y.o. female  was evaluated in triage.  Pt complains of throat swelling and difficulty breathing onset this morning.  Patient reports she was seen by her primary care doctor today to have her asthma injections started, but because of her swelling around her throat she was recommended to come to the emergency room she denies any wheezing, fever or drooling.  Review of Systems  Positive: Swelling around her throat, dyspnea Negative: Fever, drooling, wheezing  Physical Exam  BP 120/82 (BP Location: Right Arm)   Pulse 77   Temp 99 F (37.2 C) (Oral)   Resp 20   LMP 11/08/2014   SpO2 97%  Gen:   Awake, without acute distress Resp:  Normal effort, clear to auscultation bilaterally without wheezes MSK:   Moves extremities without difficulty  Other:  Swollen and tender submandibular lymph nodes present bilaterally  Medical Decision Making  Medically screening exam initiated at 3:30 PM.  Appropriate orders placed.  [name] was informed that the remainder of the evaluation will be completed by another provider, this initial triage assessment does not replace that evaluation, and the importance of remaining in the ED until their evaluation is complete.  Patient is resting in recliner comfortably without acute distress.  She is satting 100% on room air with a pulse rate of 82, and without tachypnea. Discussed with patient that we will get work-up started, however patient states she does not want to go back out to the lobby and unless she is brought back to her room at this time she will leave.  Discussed that all of the normal work-up will be started, but we do not have an available room right now. She plans on leaving.    Evlyn Courier, PA-C 10/06/21 1536    Carmin Muskrat, MD 10/06/21 1700

## 2021-10-06 NOTE — ED Triage Notes (Signed)
Per pt, states she was suppose to get her asthma injection today-states she woke this am with a sore throat and her neck glands were swollen-states her PCP sent her here-states she needs a breathing treatment-states she can's swallow her secretions

## 2021-10-06 NOTE — Telephone Encounter (Signed)
Patient was seen and evaluated in clinic.  Bilateral submandibular swelling right greater than left.  Denied tooth pain.  Did have significant posterior oropharynx erythema without frank purulence that I can appreciate.  I recommend she go to the emergency room given concern for deep neck versus odontogenic infection as she was endorsing symptoms of throat tightness.  Strep throat possible but could not test for this in office. I strongly recommend she go to the emergency room as if symptoms worsen and her airway closed off it can lead to death.  If she understands this risk and decides not to do that then that is her decision.  I sent a prescription for Augmentin 875-125 1 pill twice a day for 14 days. Referred to oral surgery. If swelling worsens she must go to ED.

## 2021-10-07 NOTE — Telephone Encounter (Signed)
I have tried to schedule this order that was placed for Oral Maxillofacial Surgery I have called 2 different ones [facility] and Katina Dung, DDS, MD who is with [facility]. [place] wouldn't take her due to they do not have privilages at [facility] if they were to need to do surgery and Dr Benson [place] office thought she should go to the ER

## 2021-10-07 NOTE — Telephone Encounter (Signed)
[facility] discussed with oral surgeons office (referral placed by me yesterday). The oral surgeon's office has recommended going to emergency room. Can we check to see how she is doing and relay this information? Thanks!

## 2021-10-07 NOTE — Telephone Encounter (Signed)
Called and spoke with pt to see how she was doing. Pt said that the swelling is still present but she is not hurting like she was as she has been taking tylenol to help with the pain.  Pt said that she has not picked up abx yet but states will go pick that up today to begin taking it. Nothing further needed.

## 2021-10-15 ENCOUNTER — Other Ambulatory Visit (HOSPITAL_COMMUNITY): Payer: Self-pay

## 2021-10-15 ENCOUNTER — Telehealth: Payer: Self-pay | Admitting: Pulmonary Disease

## 2021-10-15 DIAGNOSIS — J4489 Other specified chronic obstructive pulmonary disease: Secondary | ICD-10-CM

## 2021-10-15 DIAGNOSIS — J449 Chronic obstructive pulmonary disease, unspecified: Secondary | ICD-10-CM

## 2021-10-16 ENCOUNTER — Other Ambulatory Visit (HOSPITAL_COMMUNITY): Payer: Self-pay

## 2021-10-16 NOTE — Telephone Encounter (Signed)
The tablets can be cut in half but should not be crushed. I recommend she discuss any ongoing issues, imaging with her PCP. I advised she go to the ED at initial evaluation for imaging and recommended she see an oral surgeon.

## 2021-10-16 NOTE — Telephone Encounter (Signed)
I called the pt and advised of MH recs.    Pt stated that she has appt with her PCP on 11/9 and could not get in before then.  She did go to the ER for eval but she stated that there was a pt there that had covid and was throwing up everywhere so she left the ER due to her immune system and she stated that she still cannot swallow those pills even if she cuts them in half as it irritates her throat too much.  Please advise. thanks

## 2021-10-16 NOTE — Telephone Encounter (Signed)
The antibiotics were prescribed 10 days ago for 14 days. How many doses are left?

## 2021-10-16 NOTE — Telephone Encounter (Signed)
MH please advise on the amoxicillin.  The pt is requesting something smaller as she cannot swallow the larger pills.    Is there an imaging order that she is to have?

## 2021-10-21 ENCOUNTER — Ambulatory Visit (INDEPENDENT_AMBULATORY_CARE_PROVIDER_SITE_OTHER): Payer: Medicaid Other | Admitting: Student

## 2021-10-21 VITALS — BP 116/86 | HR 71 | Temp 98.2°F | Wt 158.3 lb

## 2021-10-21 DIAGNOSIS — J988 Other specified respiratory disorders: Secondary | ICD-10-CM | POA: Insufficient documentation

## 2021-10-21 DIAGNOSIS — B9789 Other viral agents as the cause of diseases classified elsewhere: Secondary | ICD-10-CM | POA: Diagnosis not present

## 2021-10-21 DIAGNOSIS — R059 Cough, unspecified: Secondary | ICD-10-CM

## 2021-10-21 DIAGNOSIS — M549 Dorsalgia, unspecified: Secondary | ICD-10-CM

## 2021-10-21 DIAGNOSIS — U071 COVID-19: Secondary | ICD-10-CM

## 2021-10-21 DIAGNOSIS — R112 Nausea with vomiting, unspecified: Secondary | ICD-10-CM

## 2021-10-21 DIAGNOSIS — J029 Acute pharyngitis, unspecified: Secondary | ICD-10-CM

## 2021-10-21 DIAGNOSIS — J4489 Other specified chronic obstructive pulmonary disease: Secondary | ICD-10-CM

## 2021-10-21 DIAGNOSIS — J45901 Unspecified asthma with (acute) exacerbation: Secondary | ICD-10-CM | POA: Diagnosis not present

## 2021-10-21 DIAGNOSIS — R197 Diarrhea, unspecified: Secondary | ICD-10-CM

## 2021-10-21 DIAGNOSIS — J449 Chronic obstructive pulmonary disease, unspecified: Secondary | ICD-10-CM | POA: Diagnosis not present

## 2021-10-21 MED ORDER — ALBUTEROL SULFATE (2.5 MG/3ML) 0.083% IN NEBU: 2.5000 mg | INHALATION_SOLUTION | RESPIRATORY_TRACT | 2 refills | Status: DC | PRN

## 2021-10-21 NOTE — Telephone Encounter (Signed)
l °

## 2021-10-21 NOTE — Assessment & Plan Note (Addendum)
Assessment: Patient with multiple symptoms concerning for viral infection.  She was exposed to a friend who tested positive for COVID on 10/31.  On 11/4 the patient woke up and had difficulty swallowing secondary to pain.  She describes it as a raw sensation in her throat.  She also endorsed bilateral back pain, increased cough with sputum production, vomiting, diarrhea, episodes of dizziness.  She also had episodes of burning with urination.  Over the past few days most of these have improved.  She has continued to have a sore throat and bilateral back pain.  She was seen by her pulmonologist last week she was post to receive her first injectable for her asthma however because of her throat pain and swelling they deferred this at the time.  They wrote her a prescription for amoxicillin however she was unable to take the medication due to the size of the pills.  She denies feeling short of breath or having any episodes of chest pain.  On physical exam she does have tender bilateral submandibular lymphadenopathy.  She also with diffuse expiratory wheezing.  Also exquisitely tender bilateral paraspinal muscles.  With her recent exposure to a friend with COVID-19 infection, I suspect she also has this.  She did however test negative on 11/7.  She states that she did not swab the inside of her nose just the outer portion.  Her partner is also been sick over the past few days with similar symptoms.  Offered flu and COVID testing with nasopharyngeal swab however patient refused she would prefer nasal swab.  Also perform BMP as well as urinalysis.  Due to her history of severe asthma she has high risk to needing oxygen supplementation and possible admission, patient given strict return precautions.  Because she is so late in the course I do not believe she would benefit from antivirals and she is not hypoxic so I do not believe steroids are appropriate at this time.  Conservative treatment, patient states she is  unable to tolerate Robitussin.  Overall patient states she is feeling better.  Low on my differential is pyelonephritis with her systemic symptoms of back pain, pharyngitis, pharyngeal abscess or Ludwick's angina.  Plan: -Conservative management with Tylenol, Mucinex, -Flu, COVID test pending as well as BMP and urinalysis -Refilled albuterol for nebulizer, she has called her pulmonary physician for refills on her Dulera -Strict return precautions given -Follow-up in 1 month to assure resolution of lymphadenopathy -Isolate for 3 more days.  Addendum:  BMP, UA unremarkable. She was positive for COVID-19 infection. She needs to isolate until Nov 10th and can go out without a mask on 11/14. Called and updated her. She continues to have a sore throat, denies any shortness of breath. Encouraged her to call with any questions and if she has difficulty breathing or other concerning symptoms to go to the ED.

## 2021-10-21 NOTE — Telephone Encounter (Signed)
I called pt to check on her and see how she is doing.  She stated that she is at [facility] now to check on the issues with her throat and they told her that she is positive for covid.    She stated that she needs a PA done on the [facility] as her insurance will not cover this.  Will forward to the PA pool.    Pt stated that she will need to reschedule her appt to get her injection as well.  Will send over to the pharmacy to make them aware.

## 2021-10-21 NOTE — Telephone Encounter (Signed)
Called patient to notify that we wil have to hold on starting Xolair until she is COVID negative and symptom free. She verbalized understanding. Requested f/u in 2 weeks to determine if she is clinically stable to start Ada, PharmD, MPH, BCPS Clinical Pharmacist (Rheumatology and Pulmonology)

## 2021-10-21 NOTE — Patient Instructions (Addendum)
Thank you, [name] for allowing [place] to provide your care today. Today we discussed .  Viral Infection I suspect that you have covid 19 infection or influenza. Please  wear a high quality mask out in public and around others until 10/24/21. Please continue over the counter remedies such as tylenol (1 pill every 6 hours), mucinex and robitussin if you are able to tolerate it. I will call you in the next few days with your test results. I have ordered you refills of your albuterol to put in your breathing treatment machine. For any continued sore that, please use hot tea, honey to help soothe the area. Continue to eat soups and food that you are able to tolerate. Please avoid soft drinks and try to consume as much water that you can.   Unfortunately we do not have samples of dulera. Please call Dr. Daryl [name] office to discuss samples or refills of this medication.   In the next few days if you feel as though your symptoms worsen or you have more difficulty breathing, please call 911 or go to the emergency department. I will call you with the test results in the next few days.   I have ordered the following labs for you:   Lab Orders         Resp panel by RT-PCR (RSV, Flu A&B, Covid) Nasopharyngeal Swab         BMP8+Anion Gap         Urinalysis, Reflex Microscopic       Referrals ordered today:   Referral Orders  No referral(s) requested today     I have ordered the following medication/changed the following medications:   Stop the following medications: There are no discontinued medications.   Start the following medications: No orders of the defined types were placed in this encounter.    Follow up: 1 month follow up    Should you have any questions or concerns please call the internal medicine clinic at [phone].    Kathleen [name], D.O. [place]

## 2021-10-21 NOTE — Progress Notes (Addendum)
CC: Sore throat, COVID exposure  HPI: [name] is a 60 y.o. female with a past medical history stated below and presents today for productive cough, chills, sore throat, nausea vomiting diarrhea after recent exposure to a friend who tested positive for COVID.Marland Kitchen Please see problem based assessment and plan for additional details.  Past Medical History:  Diagnosis Date   Anxiety    Arthritis    hands   Asthma    2 sets of PFT's in 04/09 without sign variability. Last set with significant decrease in FEV1 with saline alone, suggesting Asthma but  recommended clinical corelation    Asthma    Bipolar 1 disorder [facility])    therapist is Joy and is followed by [facility]   Blackout    negative work up including ESR, ANA, opthalmology referral, carotid dopplers, 2D echo, MRI and EEG.   BREAST LUMP 03/25/2008   Annotation: bilaterally Qualifier: Diagnosis of  By: Tomasa Hosteller MD, Edmon Crape.    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 Further work up includes CRP, ESR, SPEP/UPEP, hepatitis erology, A1C , repeat ANA   COPD (chronic obstructive pulmonary disease) ([place])    COPD exacerbation ([place]) 03/31/2019   Depression    DUB (dysfunctional uterine bleeding)    and pelvic pain, with negative endometrial bx in 07/09 and transvaginal U/S significant for mild fibroids in 0/09.   Emphysema of lung ([place])    GERD (gastroesophageal reflux disease)    Hallucinations 03/18/2008   Qualifier: Diagnosis of  By: Redmond Pulling  MD, Mateo Flow     Hyperlipidemia    Lower extremity edema    Neg ABI's, normal 2D echo, normal albumin   Menorrhagia    Ovarian cyst    Polysubstance abuse ([place])    none since  March 17,2009.   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 8 g 6   ASPIRIN ADULT LOW STRENGTH 81 MG EC tablet TAKE 1 Tablet BY MOUTH ONCE DAILY (Patient taking differently: Take 81 mg by mouth daily.) 90 tablet 1   DULERA 200-5 MCG/ACT AERO Inhale 2 puffs into the lungs in the morning and at bedtime. 8.8 each 11   EPINEPHrine (EPIPEN 2-PAK) 0.3 mg/0.3 mL IJ SOAJ injection Inject 0.3 mg into the muscle as needed for anaphylaxis. 1 each 3   fluticasone (FLONASE) 50 MCG/ACT nasal spray Place 1 spray into both nostrils daily. 16 g 11   HYDROcodone-acetaminophen (NORCO/VICODIN) 5-325 MG tablet Take 1 tablet by mouth every 4 (four) hours as needed. 10 tablet 0   lidocaine (LIDODERM) 5 % Place 1 patch onto the skin daily. Remove & Discard patch within 12 hours or as directed by MD 30 patch 1  loratadine (CLARITIN) 10 MG tablet Take 1 tablet (10 mg total) by mouth daily. 30 tablet 11   montelukast (SINGULAIR) 10 MG tablet Take 1 tablet (10 mg total) by mouth at bedtime. 90 tablet 3   omalizumab (XOLAIR) 150 MG/ML prefilled syringe Inject 300 mg into the skin every 14 (fourteen) days. TOTAL DOSE = 349m every 14 days) 4 mL 1   omalizumab (XOLAIR) 75 MG/0.5ML prefilled syringe Inject 75 mg into the skin every 14 (fourteen) days. TOTAL DOSE = 3760mevery 14 days 1 mL 1   ondansetron (ZOFRAN) 4 MG tablet TAKE 1 TABLET BY MOUTH ONCE DAILY AS NEEDED FOR NAUSEA FOR VOMITING 30 tablet 0   pantoprazole (PROTONIX) 40 MG tablet Take 1 tablet (40 mg total) by mouth daily. 90 tablet 1   tiotropium (SPIRIVA HANDIHALER) 18 MCG inhalation capsule Place 1 capsule (18 mcg total) into inhaler  and inhale daily. 30 capsule 11   [DISCONTINUED] divalproex (DEPAKOTE ER) 500 MG 24 hr tablet Take 1 tablet (500 mg total) by mouth 2 (two) times daily. (Patient not taking: Reported on 10/08/2019) 180 tablet 0   [DISCONTINUED] ipratropium-albuterol (DUONEB) 0.5-2.5 (3) MG/3ML SOLN Take 3 mLs by nebulization every 6 (six) hours as needed (wheezing or shortness of breath). (Patient not taking: Reported on 03/19/2020) 360 mL 1   [DISCONTINUED] metoCLOPramide (REGLAN) 5 MG tablet Take 1 tablet (5 mg total) by mouth 3 (three) times daily before meals for 5 days. (Patient not taking: Reported on 03/19/2020) 15 tablet 0   [DISCONTINUED] SUMAtriptan (IMITREX) 25 MG tablet Take 1 tablet (25 mg total) by mouth every 2 (two) hours as needed for migraine or headache. (Patient not taking: Reported on 10/08/2019) 10 tablet 0   No current facility-administered medications on file prior to visit.    Family History  Problem Relation Age of Onset   Colon cancer Mother 8579 Breast cancer Mother 8429 Rectal cancer Mother    Diabetes Father    Hypertension Father    Kidney disease Father    Colon cancer Father 7971 Prostate cancer Father    Cancer Father    Kidney disease Sister    Cancer Brother    Breast cancer Maternal Grandmother 1024 Esophageal cancer Neg Hx    Stomach cancer Neg Hx     Social History   Socioeconomic History   Marital status: Single    Spouse name: Not on file   Number of children: Not on file   Years of education: Not on file   Highest education level: Not on file  Occupational History   Not on file  Tobacco Use   Smoking status: Former    Packs/day: 0.25    Years: 30.00    Pack years: 7.50    Types: Cigarettes    Quit date: 12/28/2016    Years since quitting: 4.8   Smokeless tobacco: Never   Tobacco comments:    Occ. Wants Nicotine Patches   Vaping Use   Vaping Use: Never used  Substance and Sexual Activity   Alcohol use: Yes    Alcohol/week: 0.0 standard drinks     Comment: rarely   Drug use: Not Currently    Types: Marijuana    Comment: Sometimes.   Sexual activity: Yes    Birth control/protection: None    Comment: monogamous  Other Topics Concern   Not on file  Social History Narrative   ** Merged History Encounter **  Lives between [place] and boyfriend's house , currently trying to abstain from illegal substances, continues to smoke and says nicotine patch made her sick.   Social Determinants of Health   Financial Resource Strain: Not on file  Food Insecurity: Not on file  Transportation Needs: Not on file  Physical Activity: Not on file  Stress: Not on file  Social Connections: Not on file  Intimate Partner Violence: Not on file    Review of Systems: ROS negative except for what is noted on the assessment and plan.  Vitals:   10/21/21 0849  BP: 116/86  Pulse: 71  Temp: 98.2 F (36.8 C)  TempSrc: Oral  SpO2: 98%  Weight: 158 lb 4.8 oz (71.8 kg)     Physical Exam: Constitutional: Ill-appearing, no acute distress HENT: normocephalic atraumatic, mucous membranes moist.  No tonsillar exudates, erythema, or cobblestoning. Eyes: conjunctiva non-erythematous Neck: supple.  Tender bilateral submandibular lymphadenopathy. Cardiovascular: regular rate and rhythm, no m/r/g Pulmonary/Chest: normal work of breathing on room air, expiratory wheezing diffusely MSK: normal bulk and tone.  Bilateral paraspinal muscle tenderness Neurological: alert & oriented x 3 Skin: warm and dry Psych: Normal mood and thought process   Assessment & Plan:   See Encounters Tab for problem based charting.  Patient discussed with Dr. Caffie Damme, D.O. [facility], PGY-2 Pager: [phone], Phone: [phone] Date 10/21/2021 Time 1:15 PM

## 2021-10-21 NOTE — Telephone Encounter (Signed)
Routing to [facility] to get pt scheduled

## 2021-10-22 ENCOUNTER — Other Ambulatory Visit (HOSPITAL_COMMUNITY): Payer: Self-pay

## 2021-10-22 LAB — URINALYSIS, ROUTINE W REFLEX MICROSCOPIC
Bilirubin, UA: NEGATIVE
Glucose, UA: NEGATIVE
Ketones, UA: NEGATIVE
Leukocytes,UA: NEGATIVE
Nitrite, UA: NEGATIVE
Protein,UA: NEGATIVE
RBC, UA: NEGATIVE
Specific Gravity, UA: 1.029 (ref 1.005–1.030)
Urobilinogen, Ur: 0.2 mg/dL (ref 0.2–1.0)
pH, UA: 5 (ref 5.0–7.5)

## 2021-10-22 LAB — COVID-19, FLU A+B AND RSV
Influenza A, NAA: NOT DETECTED
Influenza B, NAA: NOT DETECTED
RSV, NAA: NOT DETECTED
SARS-CoV-2, NAA: DETECTED — AB

## 2021-10-22 LAB — BMP8+ANION GAP
Anion Gap: 13 mmol/L (ref 10.0–18.0)
BUN/Creatinine Ratio: 13 (ref 12–28)
BUN: 11 mg/dL (ref 8–27)
CO2: 22 mmol/L (ref 20–29)
Calcium: 8.8 mg/dL (ref 8.7–10.3)
Chloride: 107 mmol/L — ABNORMAL HIGH (ref 96–106)
Creatinine, Ser: 0.85 mg/dL (ref 0.57–1.00)
Glucose: 76 mg/dL (ref 70–99)
Potassium: 4.2 mmol/L (ref 3.5–5.2)
Sodium: 142 mmol/L (ref 134–144)
eGFR: 78 mL/min/{1.73_m2} (ref >59)

## 2021-10-22 NOTE — Progress Notes (Signed)
Internal Medicine Clinic Attending  Case discussed with Dr. Katsadouros  At the time of the visit.  We reviewed the resident's history and exam and pertinent patient test results.  I agree with the assessment, diagnosis, and plan of care documented in the resident's note.  

## 2021-10-23 ENCOUNTER — Telehealth: Payer: Self-pay | Admitting: Pulmonary Disease

## 2021-10-23 DIAGNOSIS — U071 COVID-19: Secondary | ICD-10-CM

## 2021-10-23 MED ORDER — ONDANSETRON 4 MG PO TBDP: 4.0000 mg | ORAL_TABLET | Freq: Three times a day (TID) | ORAL | 0 refills | Status: DC | PRN

## 2021-10-23 MED ORDER — MOLNUPIRAVIR EUA 200MG CAPSULE: 4.0000 | ORAL_CAPSULE | Freq: Two times a day (BID) | ORAL | 0 refills | Status: AC

## 2021-10-23 NOTE — Telephone Encounter (Signed)
I have called and LM on VM to make her aware of meds sent to the pharamacy.  Nothing further is needed.

## 2021-10-23 NOTE — Telephone Encounter (Signed)
Called and spoke with Patient. Patient stated she has been feeling bad since Monday and went to her PCP. Patient stated she was tested at PCP 10/21/21 for covid and was positive.  Patient stated she received call with results today.  Patient stated PCP did not prescribe any medications for treatment. Patient stated she has sinus pressure, headache, ear pain, body aches, sore throat, nonproductive cough, nausea, and some vomiting.  Patient stated she would like Zofran to help with nausea/vomiting, and something to help relieve her symptoms.  Patient stated she has a albuterol inhaler, but it is at the pharmacy to be picked up.  Patient requested [facility] for any prescriptions.  Message routed to Dr. Silas Flood to advise

## 2021-10-23 NOTE — Telephone Encounter (Signed)
LMTCB

## 2021-10-23 NOTE — Telephone Encounter (Signed)
Molnupiravir 800 mg BID  x 5 days for covid. Zofran ODT for nausea. Both sent to local pharmacy.

## 2021-10-27 ENCOUNTER — Telehealth: Payer: Self-pay

## 2021-10-27 NOTE — Telephone Encounter (Signed)
Received TC from patient who states she tested positive for Covid on 11/9 at her office visit.  She states she is getting worse and now c/o continued coughing, diarrhea, N/V, fever, body aches, and SOB.  She denies chest pain, but states "my chest feels like it is fluttering at times".  She was instructed to present to ED for evaluation, she verbalized understanding. SChaplin, RN,BSN

## 2021-10-27 NOTE — Telephone Encounter (Signed)
Agree she needs to be evaluated. If she's having fluttering she'll need an EKG etc. She called her pulmonologist last week and they started an anti-viral.

## 2021-10-29 ENCOUNTER — Telehealth: Payer: Self-pay | Admitting: Pulmonary Disease

## 2021-10-29 DIAGNOSIS — J4489 Other specified chronic obstructive pulmonary disease: Secondary | ICD-10-CM

## 2021-10-29 DIAGNOSIS — J449 Chronic obstructive pulmonary disease, unspecified: Secondary | ICD-10-CM

## 2021-10-29 MED ORDER — BUDESONIDE-FORMOTEROL FUMARATE 160-4.5 MCG/ACT IN AERO: 2.0000 | INHALATION_SPRAY | Freq: Two times a day (BID) | RESPIRATORY_TRACT | 6 refills | Status: DC

## 2021-10-29 MED ORDER — SPIRIVA HANDIHALER 18 MCG IN CAPS: 18.0000 ug | ORAL_CAPSULE | Freq: Every day | RESPIRATORY_TRACT | 6 refills | Status: DC

## 2021-10-29 NOTE — Telephone Encounter (Signed)
Call made to patient, confirmed DOB. She requested a refill of any inhaler on her list. She reports since she was diagnosed with covid she is significantly more SOB. She is considering going to the hospital. She reports chills, sweats, body aches, and fever. Most recent temp 100. She reports the swelling in neck has remained and she is becoming concerned. Patient is vomiting and has diarrhea. Wet cough with thick mucous. She is very hoarse while speaking and is unable to complete a sentence without coughing or stopping to catch her breathe. She was unable to get [facility] due to insurance not covering it. She is using her nebulizer routinely every 4 hours which she reports helps for very short periods of time. She has 4 more doses left of the Molnupiravir left to take. I asked if she was using the symbicort and spiriva and she stated she was not using anything because she ran out of everything.   MH please advise should patient be using Duler, Symbicort, and spiriva? Patient is unsure which inhaler she should be using. We can send a message to pharmacy team to inquire about what is covered once we hear back from you. Thanks :)

## 2021-10-29 NOTE — Telephone Encounter (Signed)
Call returned to patient, confirmed DOB. Made aware of MH recommendations. She states she will go ahead and go to ED. She confirms that she has 4 pills left. Confirmed pharmacy. Spiriva and Symbicort sent in.   Nothing further needed at this time.   Will route message to provider as FYI regarding the medication.

## 2021-10-29 NOTE — Telephone Encounter (Signed)
Spiriva and [name] is what she was on previously. Spiriva and Symbicort 160-4.5 is fine. Or any other combination of Spiriva and high dose ICS/LABA.  Can you clarify with her, she has 4 more pills or 4 more doses (16 pills) left of mulnupiravir?   Sounds like ED evaluation is warranted, worried about post viral bacterial pneumonia.

## 2021-10-30 ENCOUNTER — Telehealth: Payer: Self-pay | Admitting: *Deleted

## 2021-10-30 ENCOUNTER — Emergency Department (HOSPITAL_COMMUNITY)
Admission: EM | Admit: 2021-10-30 | Discharge: 2021-10-30 | Discharge status: Against Medical Advice | Payer: Medicaid Other

## 2021-10-30 ENCOUNTER — Other Ambulatory Visit: Payer: Self-pay

## 2021-10-30 ENCOUNTER — Encounter (HOSPITAL_COMMUNITY): Payer: Self-pay

## 2021-10-30 ENCOUNTER — Emergency Department (HOSPITAL_COMMUNITY): Payer: Medicaid Other

## 2021-10-30 ENCOUNTER — Emergency Department (HOSPITAL_COMMUNITY)
Admission: EM | Admit: 2021-10-30 | Discharge: 2021-10-30 | Disposition: A | Discharge status: Home / Self Care | Payer: Medicaid Other | Attending: Emergency Medicine | Admitting: Emergency Medicine

## 2021-10-30 DIAGNOSIS — U071 COVID-19: Secondary | ICD-10-CM | POA: Diagnosis not present

## 2021-10-30 DIAGNOSIS — R0602 Shortness of breath: Secondary | ICD-10-CM | POA: Diagnosis present

## 2021-10-30 DIAGNOSIS — R1110 Vomiting, unspecified: Secondary | ICD-10-CM | POA: Insufficient documentation

## 2021-10-30 DIAGNOSIS — Z5321 Procedure and treatment not carried out due to patient leaving prior to being seen by health care provider: Secondary | ICD-10-CM | POA: Diagnosis not present

## 2021-10-30 DIAGNOSIS — J45909 Unspecified asthma, uncomplicated: Secondary | ICD-10-CM | POA: Insufficient documentation

## 2021-10-30 LAB — CBC WITH DIFFERENTIAL/PLATELET
Abs Immature Granulocytes: 0.02 10*3/uL (ref 0.00–0.07)
Basophils Absolute: 0 10*3/uL (ref 0.0–0.1)
Basophils Relative: 1 %
Eosinophils Absolute: 0.3 10*3/uL (ref 0.0–0.5)
Eosinophils Relative: 5 %
HCT: 42.5 % (ref 36.0–46.0)
Hemoglobin: 13.6 g/dL (ref 12.0–15.0)
Immature Granulocytes: 0 %
Lymphocytes Relative: 33 %
Lymphs Abs: 2.2 10*3/uL (ref 0.7–4.0)
MCH: 28.5 pg (ref 26.0–34.0)
MCHC: 32 g/dL (ref 30.0–36.0)
MCV: 88.9 fL (ref 80.0–100.0)
Monocytes Absolute: 0.5 10*3/uL (ref 0.1–1.0)
Monocytes Relative: 8 %
Neutro Abs: 3.7 10*3/uL (ref 1.7–7.7)
Neutrophils Relative %: 53 %
Platelets: 292 10*3/uL (ref 150–400)
RBC: 4.78 MIL/uL (ref 3.87–5.11)
RDW: 13.6 % (ref 11.5–15.5)
WBC: 6.8 10*3/uL (ref 4.0–10.5)
nRBC: 0 % (ref 0.0–0.2)

## 2021-10-30 LAB — COMPREHENSIVE METABOLIC PANEL
ALT: 31 U/L (ref 0–44)
AST: 24 U/L (ref 15–41)
Albumin: 4 g/dL (ref 3.5–5.0)
Alkaline Phosphatase: 78 U/L (ref 38–126)
Anion gap: 6 (ref 5–15)
BUN: 10 mg/dL (ref 6–20)
CO2: 26 mmol/L (ref 22–32)
Calcium: 8.8 mg/dL — ABNORMAL LOW (ref 8.9–10.3)
Chloride: 108 mmol/L (ref 98–111)
Creatinine, Ser: 0.94 mg/dL (ref 0.44–1.00)
GFR, Estimated: >60 mL/min (ref >60)
Glucose, Bld: 65 mg/dL — ABNORMAL LOW (ref 70–99)
Potassium: 3.4 mmol/L — ABNORMAL LOW (ref 3.5–5.1)
Sodium: 140 mmol/L (ref 135–145)
Total Bilirubin: 0.5 mg/dL (ref 0.3–1.2)
Total Protein: 7 g/dL (ref 6.5–8.1)

## 2021-10-30 LAB — LACTIC ACID, PLASMA: Lactic Acid, Venous: 1.9 mmol/L (ref 0.5–1.9)

## 2021-10-30 LAB — BRAIN NATRIURETIC PEPTIDE: B Natriuretic Peptide: 41.7 pg/mL (ref 0.0–100.0)

## 2021-10-30 LAB — C-REACTIVE PROTEIN: CRP: 0.6 mg/dL (ref <1.0)

## 2021-10-30 LAB — PROCALCITONIN: Procalcitonin: <0.1 ng/mL

## 2021-10-30 LAB — TROPONIN I (HIGH SENSITIVITY): Troponin I (High Sensitivity): 3 ng/L (ref <18)

## 2021-10-30 MED ORDER — ALBUTEROL SULFATE HFA 108 (90 BASE) MCG/ACT IN AERS
6.0000 | INHALATION_SPRAY | Freq: Once | RESPIRATORY_TRACT | Status: AC
  Administered 2021-10-30: 6 via RESPIRATORY_TRACT
  Filled 2021-10-30: qty 6.7

## 2021-10-30 NOTE — ED Provider Notes (Signed)
Emergency Medicine Provider Triage Evaluation Note  Kathleen [name] , a 60 y.o. female  was evaluated in triage.  Pt complains of shortness of breath.  She tested positive for covid and started having symptoms 9 days ago.  She eports that she has run out of her albuterol neb solution 2 days ago.  She has a rx waiting but has been unable to get it.   She took paxlovid but states she didn't take 4 pills of it.  Vaccinated but not boosted.   She hasn't had any albuterol since yesterday.    Last fever last night  Was told to come in by her pulm.   Review of Systems  Positive: Shortness of breath, fever, chest pain Negative: Syncope  Physical Exam  BP 129/82 (BP Location: Right Arm)   Pulse 79   Temp 98.3 F (36.8 C) (Oral)   Resp (!) 24   LMP 11/08/2014   SpO2 100%  Gen:   Awake, frequent cough, increased work of breathing Resp:  Increased work of breathing, frequent cough.  Diffuse decreased air movement and wheezes bilaterally with associated rhonchi. MSK:   Moves extremities without difficulty  Other:  Patient is not an extremis  Medical Decision Making  Medically screening exam initiated at 12:42 PM.  Appropriate orders placed.  ANGLES TREVIZO was informed that the remainder of the evaluation will be completed by another provider, this initial triage assessment does not replace that evaluation, and the importance of remaining in the ED until their evaluation is complete.     Lorin Glass, PA-C 10/30/21 1258    Regan Lemming, MD 10/30/21 1544

## 2021-10-30 NOTE — ED Triage Notes (Signed)
Pt presents with increasing SHOB over the past few days. She reports being diagnosed with COVID last Monday. She has hx of asthma, and was told to come here by pulmonologist. She also endorses vomiting.

## 2021-10-30 NOTE — ED Notes (Signed)
Pt called for rooming x2. No answer 

## 2021-10-30 NOTE — Telephone Encounter (Signed)
Call to patient.  VM obtained.  Msg left reiterating instructions to present to ER for evaluation.  Als asked patient to RTC to [facility].  Sander Nephew, RN 10/30/2021 9:42 AM.

## 2021-10-30 NOTE — ED Notes (Signed)
Pt left ED.

## 2021-11-03 NOTE — Telephone Encounter (Signed)
Left VM for patient to assess how she is feeling since COVID19 diagnosis and if she'd like to schedule Xolair new start. Requested return call.  Will f/u  Knox Saliva, PharmD, MPH, BCPS Clinical Pharmacist (Rheumatology and Pulmonology)

## 2021-11-03 NOTE — Telephone Encounter (Signed)
Monchell, there is an 'incomplete' note by you. Can this note be closed? Thanks

## 2021-11-13 ENCOUNTER — Other Ambulatory Visit (HOSPITAL_COMMUNITY): Payer: Self-pay

## 2021-11-13 NOTE — Telephone Encounter (Signed)
Patient was incorrectly scheduled for infusion center for [place]. R/s with pharmacy clinic on 11/17/21. Patient aware of Epipen requirement and 2-hour monitoring period for first dose  Knox Saliva, PharmD, MPH, BCPS Clinical Pharmacist (Rheumatology and Pulmonology)

## 2021-11-17 ENCOUNTER — Ambulatory Visit: Payer: Medicaid Other

## 2021-11-17 ENCOUNTER — Other Ambulatory Visit: Payer: Medicaid Other | Admitting: Pharmacist

## 2021-11-17 NOTE — Telephone Encounter (Signed)
Patient was no-show to Xolair new start visit. She states she wasn't feeling well and wants to hold on r/s. States that she plans to reach out to PCP today to make appt and will reach back out to pulm clinic when she is ready to start Isabel, PharmD, MPH, BCPS Clinical Pharmacist (Rheumatology and Pulmonology)

## 2021-11-18 ENCOUNTER — Other Ambulatory Visit: Payer: Self-pay

## 2021-11-18 ENCOUNTER — Ambulatory Visit (INDEPENDENT_AMBULATORY_CARE_PROVIDER_SITE_OTHER): Payer: Medicaid Other | Admitting: Internal Medicine

## 2021-11-18 ENCOUNTER — Encounter: Payer: Self-pay | Admitting: Internal Medicine

## 2021-11-18 ENCOUNTER — Telehealth: Payer: Self-pay | Admitting: *Deleted

## 2021-11-18 VITALS — BP 124/80 | HR 92 | Temp 98.4°F | Resp 24 | Ht 63.0 in | Wt 162.0 lb

## 2021-11-18 DIAGNOSIS — J449 Chronic obstructive pulmonary disease, unspecified: Secondary | ICD-10-CM

## 2021-11-18 DIAGNOSIS — H539 Unspecified visual disturbance: Secondary | ICD-10-CM

## 2021-11-18 DIAGNOSIS — R590 Localized enlarged lymph nodes: Secondary | ICD-10-CM

## 2021-11-18 DIAGNOSIS — Z0000 Encounter for general adult medical examination without abnormal findings: Secondary | ICD-10-CM

## 2021-11-18 DIAGNOSIS — D492 Neoplasm of unspecified behavior of bone, soft tissue, and skin: Secondary | ICD-10-CM | POA: Diagnosis not present

## 2021-11-18 DIAGNOSIS — H9313 Tinnitus, bilateral: Secondary | ICD-10-CM | POA: Diagnosis not present

## 2021-11-18 DIAGNOSIS — J4489 Other specified chronic obstructive pulmonary disease: Secondary | ICD-10-CM

## 2021-11-18 DIAGNOSIS — Z87892 Personal history of anaphylaxis: Secondary | ICD-10-CM

## 2021-11-18 DIAGNOSIS — K115 Sialolithiasis: Secondary | ICD-10-CM | POA: Insufficient documentation

## 2021-11-18 DIAGNOSIS — D229 Melanocytic nevi, unspecified: Secondary | ICD-10-CM | POA: Diagnosis not present

## 2021-11-18 MED ORDER — DULERA 200-5 MCG/ACT IN AERO: 2.0000 | INHALATION_SPRAY | Freq: Two times a day (BID) | RESPIRATORY_TRACT | 11 refills | Status: DC

## 2021-11-18 MED ORDER — ALBUTEROL SULFATE HFA 108 (90 BASE) MCG/ACT IN AERS: 1.0000 | INHALATION_SPRAY | Freq: Four times a day (QID) | RESPIRATORY_TRACT | 6 refills | Status: DC | PRN

## 2021-11-18 MED ORDER — EPINEPHRINE 0.3 MG/0.3ML IJ SOAJ: 0.3000 mg | INTRAMUSCULAR | 3 refills | Status: DC | PRN

## 2021-11-18 MED ORDER — ALBUTEROL SULFATE (2.5 MG/3ML) 0.083% IN NEBU: 2.5000 mg | INHALATION_SOLUTION | RESPIRATORY_TRACT | 2 refills | Status: DC | PRN

## 2021-11-18 MED ORDER — SPIRIVA HANDIHALER 18 MCG IN CAPS: 18.0000 ug | ORAL_CAPSULE | Freq: Every day | RESPIRATORY_TRACT | 6 refills | Status: DC

## 2021-11-18 NOTE — Addendum Note (Signed)
Addended byGaylan Gerold on: 11/18/2021 04:35 PM   Modules accepted: Orders

## 2021-11-18 NOTE — Assessment & Plan Note (Signed)
Ophthalmology referral placed for eye glasses Prescriptions refilled

## 2021-11-18 NOTE — Addendum Note (Signed)
Addended byLajean Manes on: 11/18/2021 04:36 PM   Modules accepted: Orders

## 2021-11-18 NOTE — Patient Instructions (Signed)
We have placed referral for ENT, dermatology and ophthalmology  I have ordered a CT scan for you to receive.   Medication requests refilled  I will call you with the results once we get them.

## 2021-11-18 NOTE — Assessment & Plan Note (Addendum)
Persistent submandibular lymphadenopathy x several months. She reports associated dysphagia to solids and liquids, cough, hoarse voice, neck swelling, and severe tenderness. Sxs were initially seen 1 mo ago during clinic visit in the setting of acute viral illness. She is here today for 1 mo f/u and sxs are persistent. She has no family hx of head and neck cancer that she is aware about. She reports to be taking zofran prn for nausea and occasional associated vomiting. She denies weight loss, new night sweats, fever, and chills; no B sxs. Vitals stable, afebrile. On exam, bilateral tender submandibular lymphadenopathy that is extremely TTP and mobile; no axillary or supraclavicular or auricular lymphadenopathy appreciated. CBC from 1 mo ago completely wnl. Ddx includes reactive lymphadenopathy, lymphadenitis, head and neck carcinoma given 30 pack year smoking hx, sarcoidosis, pharyngeal abscess, and ludwigs angina. Given persistent sxs, will proceed with stat head and neck soft tissue CT w wo contrast and urgent ENT referal. Will f/u with pt in clinic in 2 weeks.   F/u on Ct head and neck  ENT referral placed F/u in clinic in 2 weeks

## 2021-11-18 NOTE — Assessment & Plan Note (Signed)
Referral placed again today as pt did not appropriately f/u during initial referral from 9/16.   Dermatology referral placed

## 2021-11-18 NOTE — Progress Notes (Signed)
CC: 1 mo f/u for submandibular lymphadenopathy   HPI:  [name] is a 60 y.o. female with a PMHx stated below and presents today for stated above. Please see the Encounters tab for problem-based Assessment & Plan for additional details.   Past Medical History:  Diagnosis Date   Anxiety    Arthritis    hands   Asthma    2 sets of PFT's in 04/09 without sign variability. Last set with significant decrease in FEV1 with saline alone, suggesting Asthma but  recommended clinical corelation    Asthma    Bipolar 1 disorder [facility])    therapist is Joy and is followed by [facility]   Blackout    negative work up including ESR, ANA, opthalmology referral, carotid dopplers, 2D echo, MRI and EEG.   BREAST LUMP 03/25/2008   Annotation: bilaterally Qualifier: Diagnosis of  By: Tomasa Hosteller MD, Edmon Crape.    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 Further work up includes CRP, ESR, SPEP/UPEP, hepatitis erology, A1C , repeat ANA   COPD (chronic obstructive pulmonary disease) ([place])    COPD exacerbation ([place]) 03/31/2019   Depression    DUB (dysfunctional uterine bleeding)    and pelvic pain, with negative endometrial bx in 07/09 and transvaginal U/S significant for mild fibroids in 0/09.   Emphysema of lung ([place])    GERD (gastroesophageal reflux disease)    Hallucinations 03/18/2008   Qualifier: Diagnosis of  By: Redmond Pulling  MD, Mateo Flow     Hyperlipidemia    Lower extremity edema    Neg ABI's, normal 2D echo, normal albumin   Menorrhagia    Ovarian cyst    Polysubstance abuse ([place])    none since March 17,2009.   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 10.2 g 6   fluticasone (FLONASE) 50 MCG/ACT nasal spray Place 1 spray into both nostrils daily. 16 g 11   HYDROcodone-acetaminophen (NORCO/VICODIN) 5-325 MG tablet Take 1 tablet by mouth every 4 (four) hours as needed. 10 tablet 0   lidocaine (LIDODERM) 5 % Place 1 patch onto the skin daily. Remove & Discard patch within 12 hours or as directed by MD 30 patch 1   loratadine (CLARITIN) 10 MG tablet Take 1 tablet (10 mg total) by mouth daily. 30 tablet 11   montelukast (SINGULAIR) 10 MG tablet Take 1 tablet (10 mg total) by mouth at bedtime. 90 tablet 3   omalizumab (XOLAIR) 150 MG/ML prefilled syringe Inject 300 mg into the skin every 14 (fourteen) days. TOTAL DOSE = 371m every 14 days) 4 mL 1  omalizumab [name]) 75 MG/0.5ML prefilled syringe Inject 75 mg into the skin every 14 (fourteen) days. TOTAL DOSE = 32m every 14 days 1 mL 1   ondansetron (ZOFRAN ODT) 4 MG disintegrating tablet Take 1 tablet (4 mg total) by mouth every 8 (eight) hours as needed for nausea or vomiting. 20 tablet 0   ondansetron (ZOFRAN) 4 MG tablet TAKE 1 TABLET BY MOUTH ONCE DAILY AS NEEDED FOR NAUSEA FOR VOMITING 30 tablet 0   pantoprazole (PROTONIX) 40 MG tablet Take 1 tablet (40 mg total) by mouth daily. 90 tablet 1   [DISCONTINUED] divalproex (DEPAKOTE ER) 500 MG 24 hr tablet Take 1 tablet (500 mg total) by mouth 2 (two) times daily. (Patient not taking: Reported on 10/08/2019) 180 tablet 0   [DISCONTINUED] ipratropium-albuterol (DUONEB) 0.5-2.5 (3) MG/3ML SOLN Take 3 mLs by nebulization every 6 (six) hours as needed  (wheezing or shortness of breath). (Patient not taking: Reported on 03/19/2020) 360 mL 1   [DISCONTINUED] metoCLOPramide (REGLAN) 5 MG tablet Take 1 tablet (5 mg total) by mouth 3 (three) times daily before meals for 5 days. (Patient not taking: Reported on 03/19/2020) 15 tablet 0   [DISCONTINUED] SUMAtriptan (IMITREX) 25 MG tablet Take 1 tablet (25 mg total) by mouth every 2 (two) hours as needed for migraine or headache. (Patient not taking: Reported on 10/08/2019) 10 tablet 0   No current facility-administered medications on file prior to visit.    Family History  Problem Relation Age of Onset   Colon cancer Mother 861  Breast cancer Mother 823  Rectal cancer Mother    Diabetes Father    Hypertension Father    Kidney disease Father    Colon cancer Father 766  Prostate cancer Father    Cancer Father    Kidney disease Sister    Cancer Brother    Breast cancer Maternal Grandmother 141  Esophageal cancer Neg Hx    Stomach cancer Neg Hx     Social History   Socioeconomic History   Marital status: Single    Spouse name: Not on file   Number of children: Not on file   Years of education: Not on file   Highest education level: Not on file  Occupational History   Not on file  Tobacco Use   Smoking status: Former    Packs/day: 0.25    Years: 30.00    Pack years: 7.50    Types: Cigarettes    Quit date: 12/28/2016    Years since quitting: 4.8   Smokeless tobacco: Never   Tobacco comments:    Occ. Wants Nicotine Patches   Vaping Use   Vaping Use: Never used  Substance and Sexual Activity   Alcohol use: Yes    Alcohol/week: 0.0 standard drinks    Comment: rarely   Drug use: Not Currently    Types: Marijuana    Comment: Sometimes.   Sexual activity: Yes    Birth control/protection: None    Comment: monogamous  Other Topics Concern   Not on file  Social History Narrative   ** Merged History Encounter **       [place]  trying to abstain from illegal substances, continues to smoke and says nicotine patch made her sick.   Social Determinants of Health   Financial Resource Strain: Not on file  Food Insecurity: Not on file  Transportation Needs: Not on file  Physical Activity: Not on file  Stress: Not on file  Social Connections: Not on file  Intimate Partner Violence: Not on file    Review of Systems: ROS negative except for what is noted on the assessment and plan.  Vitals:   11/18/21 1442  BP: 124/80  Pulse: 92  Resp: (!) 24  Temp: 98.4 F (36.9 C)  TempSrc: Oral  SpO2: 99%  Weight: 162 lb (73.5 kg)  Height: '5\' 3"'  (1.6 m)     Physical Exam: Constitutional: alert, well-appearing, in NAD HENT: normocephalic, atraumatic, mucous membranes moist Neck: bilateral tender submandibular lymphadenopathy that is extremely TTP and mobile. No axillary or supraclavicular or auricular lymphadenopathy appreciated. Cardiovascular: RRR, no m/r/g, non-edematous bilateral LE Pulmonary/Chest: normal work of breathing on RA, LCTAB Abdominal: soft, non-tender to palpation, non-distended MSK: normal bulk and tone  Neurological: A&O x 3, 5/5 strength in bilateral upper and lower extremities    Assessment & Plan:   See Encounters Tab for problem based charting.  Patient seen with Dr. Lavonda Jumbo, MD  Internal Medicine Resident, PGY-1 [facility]

## 2021-11-18 NOTE — Telephone Encounter (Signed)
Received call from [place] stating Rx for ventolin will need to be resent as Dr. Posey Pronto is not yet certified with [facility].

## 2021-11-19 ENCOUNTER — Telehealth: Payer: Self-pay

## 2021-11-19 MED ORDER — ALBUTEROL SULFATE HFA 108 (90 BASE) MCG/ACT IN AERS: 1.0000 | INHALATION_SPRAY | Freq: Four times a day (QID) | RESPIRATORY_TRACT | 6 refills | Status: DC | PRN

## 2021-11-19 NOTE — Telephone Encounter (Signed)
Ventolin sent to patient's pharmacy.

## 2021-11-19 NOTE — Telephone Encounter (Signed)
PA  for pt ( EPINEPHRINE 0.3 MG/0.3ML AUTO INJECTOR) came through on cover my meds  was done and submitted with office notes from 4/12.Marland Kitchen awaiting approval or denial

## 2021-11-19 NOTE — Telephone Encounter (Signed)
DEICISON ::     Denied  reason brand name drug is covered without a PA .Marland Kitchen please  send RX for brand  name and send to pharmacy ...    DR MADE AWARE

## 2021-11-20 ENCOUNTER — Other Ambulatory Visit: Payer: Self-pay

## 2021-11-20 DIAGNOSIS — J449 Chronic obstructive pulmonary disease, unspecified: Secondary | ICD-10-CM

## 2021-11-20 DIAGNOSIS — J4489 Other specified chronic obstructive pulmonary disease: Secondary | ICD-10-CM

## 2021-11-20 NOTE — Telephone Encounter (Signed)
Dr. Saverio Danker refilled the prescription yesterday morning after Dr. Serita Grit prescription failed to be sent. Were there issues with the prescription from Dr. Saverio Danker as well? Thanks

## 2021-11-23 NOTE — Progress Notes (Signed)
Internal Medicine Clinic Attending  I saw and evaluated the patient.  I personally confirmed the key portions of the history and exam documented by Dr. Patel and I reviewed pertinent patient test results.  The assessment, diagnosis, and plan were formulated together and I agree with the documentation in the resident's note.  

## 2021-12-08 ENCOUNTER — Other Ambulatory Visit (HOSPITAL_COMMUNITY): Payer: Self-pay

## 2021-12-22 ENCOUNTER — Other Ambulatory Visit (HOSPITAL_COMMUNITY): Payer: Self-pay

## 2021-12-23 ENCOUNTER — Other Ambulatory Visit (HOSPITAL_COMMUNITY): Payer: Self-pay

## 2021-12-28 ENCOUNTER — Ambulatory Visit (HOSPITAL_COMMUNITY): Admission: RE | Admit: 2021-12-28 | Payer: Medicaid Other | Source: Ambulatory Visit

## 2021-12-28 ENCOUNTER — Ambulatory Visit (HOSPITAL_COMMUNITY): Payer: Medicaid Other

## 2021-12-28 NOTE — Addendum Note (Signed)
Addended by: Lalla Brothers T on: 12/28/2021 08:57 AM   Modules accepted: Orders

## 2021-12-29 ENCOUNTER — Other Ambulatory Visit (HOSPITAL_COMMUNITY): Payer: Self-pay

## 2022-01-04 ENCOUNTER — Ambulatory Visit (HOSPITAL_COMMUNITY)
Admission: RE | Admit: 2022-01-04 | Discharge: 2022-01-04 | Disposition: A | Discharge status: Home / Self Care | Payer: Medicaid Other | Source: Ambulatory Visit | Attending: Student in an Organized Health Care Education/Training Program | Admitting: Student in an Organized Health Care Education/Training Program

## 2022-01-04 ENCOUNTER — Other Ambulatory Visit: Payer: Self-pay

## 2022-01-04 DIAGNOSIS — R590 Localized enlarged lymph nodes: Secondary | ICD-10-CM | POA: Insufficient documentation

## 2022-01-04 LAB — POCT I-STAT CREATININE: Creatinine, Ser: 1 mg/dL (ref 0.44–1.00)

## 2022-01-04 MED ORDER — IOHEXOL 300 MG/ML  SOLN
75.0000 mL | Freq: Once | INTRAMUSCULAR | Status: AC | PRN
  Administered 2022-01-04: 75 mL via INTRAVENOUS

## 2022-01-05 ENCOUNTER — Telehealth: Payer: Self-pay

## 2022-01-05 ENCOUNTER — Other Ambulatory Visit (HOSPITAL_COMMUNITY): Payer: Self-pay

## 2022-01-05 NOTE — Telephone Encounter (Signed)
Requesting CT scan test result, please call pt back.

## 2022-01-07 ENCOUNTER — Ambulatory Visit (INDEPENDENT_AMBULATORY_CARE_PROVIDER_SITE_OTHER): Payer: Medicaid Other | Admitting: Student

## 2022-01-07 DIAGNOSIS — J449 Chronic obstructive pulmonary disease, unspecified: Secondary | ICD-10-CM | POA: Diagnosis not present

## 2022-01-07 DIAGNOSIS — K115 Sialolithiasis: Secondary | ICD-10-CM | POA: Diagnosis not present

## 2022-01-07 DIAGNOSIS — J4489 Other specified chronic obstructive pulmonary disease: Secondary | ICD-10-CM

## 2022-01-07 DIAGNOSIS — M533 Sacrococcygeal disorders, not elsewhere classified: Secondary | ICD-10-CM

## 2022-01-07 DIAGNOSIS — R221 Localized swelling, mass and lump, neck: Secondary | ICD-10-CM

## 2022-01-07 MED ORDER — OXYCODONE HCL 5 MG PO TABS: 5.0000 mg | ORAL_TABLET | Freq: Four times a day (QID) | ORAL | 0 refills | Status: DC | PRN

## 2022-01-07 MED ORDER — DULERA 200-5 MCG/ACT IN AERO: 2.0000 | INHALATION_SPRAY | Freq: Two times a day (BID) | RESPIRATORY_TRACT | 11 refills | Status: DC

## 2022-01-07 MED ORDER — LIDOCAINE 5 % EX PTCH: 1.0000 | MEDICATED_PATCH | CUTANEOUS | 1 refills | Status: DC

## 2022-01-07 NOTE — Patient Instructions (Addendum)
Salivary stone Please start using sour hard candies, hot compress, and massages to the area Take Ibuprofen 400mg  4 times daily for pain, can increase to 800 mg 3 times daily You can also use tylenol in addition for pain I have sent a short course of oxycodone if pain is still unbearable Please follow up with the ENT doctor, Dr. Benjamine Mola on 2/15 about this   I have sent refill for dulera as well, follow up with your pulmonologist in 2 weeks.

## 2022-01-08 ENCOUNTER — Other Ambulatory Visit: Payer: Self-pay | Admitting: *Deleted

## 2022-01-08 ENCOUNTER — Encounter: Payer: Self-pay | Admitting: Physical Therapy

## 2022-01-08 DIAGNOSIS — J449 Chronic obstructive pulmonary disease, unspecified: Secondary | ICD-10-CM

## 2022-01-08 DIAGNOSIS — J4489 Other specified chronic obstructive pulmonary disease: Secondary | ICD-10-CM

## 2022-01-08 NOTE — Therapy (Signed)
[place]. [place], [place], [address] Phone: [phone]   Fax:  [phone]  Patient Details  Name: Kathleen [name] MRN: [id number] Date of Birth: [birth year] Referring Provider:  No ref. provider found  Encounter Date: 01/08/2022  PHYSICAL THERAPY DISCHARGE SUMMARY  Visits from Start of Care: 5  Current functional level related to goals / functional outcomes: Did not return since last visit in August 2022; FOTO DC complete 01/08/22   Remaining deficits: Unable to assess    Education / Equipment: N/A    Patient agrees to discharge. Patient goals were not met. Patient is being discharged due to not returning since the last visit.  Windell Norfolk, DPT, PN2   Supplemental Physical Therapist [place]    Pager [phone] Acute Rehab Office [facility]. [place], [place], [address] Phone: [phone]   Fax:  [phone]

## 2022-01-08 NOTE — Telephone Encounter (Signed)
Call from patient was able to get only 10 of the Oxycodone 5 mg  prescribed on yesterday as pharmacy was out.  Would like to get a prescription for the remaining 10.  Also wants a refill on Zofran.

## 2022-01-09 ENCOUNTER — Telehealth: Payer: Self-pay | Admitting: Student

## 2022-01-09 DIAGNOSIS — R590 Localized enlarged lymph nodes: Secondary | ICD-10-CM

## 2022-01-09 DIAGNOSIS — K115 Sialolithiasis: Secondary | ICD-10-CM

## 2022-01-09 MED ORDER — OXYCODONE HCL 5 MG PO TABS: 5.0000 mg | ORAL_TABLET | Freq: Four times a day (QID) | ORAL | 0 refills | Status: DC | PRN

## 2022-01-09 MED ORDER — ONDANSETRON 4 MG PO TBDP: 4.0000 mg | ORAL_TABLET | Freq: Three times a day (TID) | ORAL | 0 refills | Status: DC | PRN

## 2022-01-09 NOTE — Addendum Note (Signed)
Addended bySanjuan Dame on: 01/09/2022 03:15 PM   Modules accepted: Orders

## 2022-01-09 NOTE — Telephone Encounter (Signed)
Received another after-hours page from Ms. Fisch. She reports she was only able to pick up 10 tablets of the oxycodone at [facility] due to supply issues. Mentions she continues to have pain in the area that is significant. She states that she feels it can be hard to swallow right now, but denies difficulty breathing. No objective fevers, but she does report she feels warm. She is inquiring about more penicillin. Ms. Abdelrahman is also requesting a re-fill for Zofran.  Patient recently had CT soft tissue neck that revealed stone on left submandibular duct. Discussed with Ms. Braaksma that I would fill the rest of the oxycodone she was unable to pick up along with the Zofran. PDMP reviewed, appropriate. Given I have not seen her in person, I felt that it would not be appropriate to give more antibiotics at this time. I will message the front desk to see if we can see Ms. Weiland on Monday for further evaluation. Discussed with Ms. Zahradnik she should go to the Emergency Department if she begins to feel like she is having difficulty breathing. Patient verbalized understanding.  - Oxycodone 5mg  every 6 hours as needed #10 tablets - Zofran 4mg  every 8 hours as needed #20 tablets - Will send message to front desk for f/u appointment  [name], MD Internal Medicine PGY-2 [phone]

## 2022-01-09 NOTE — Telephone Encounter (Signed)
Received after-hours page from Ms. Tramell. Patient did not answer, unable to leave voicemail.   [name], MD Internal Medicine PGY-2 [phone]

## 2022-01-09 NOTE — Addendum Note (Signed)
Addended bySanjuan Dame on: 01/09/2022 07:00 PM   Modules accepted: Orders

## 2022-01-11 NOTE — Assessment & Plan Note (Addendum)
Patient presents to follow-up on neck pain.  CT soft tissue neck on 1/23 reveals left submandibular stones 4 mm and 2 mm.  No enlarged lymph nodes no other masses otherwise appears normal.  Patient continues to endorse significant symptoms of pain, nausea, occasional vomiting.  Denies any fevers or chills.  Continues to be very tender to palpation in the submandibular neck more so on the left.  Discussed CT findings and that likely her pain is secondary to stones in the salivary duct.  Discussed secretagogues with sour candies, warm compresses, and massaging the area to help with mobilizing stone.  She does have follow-up with ENT on 2/15 which she may likely need given the stone has not passed in the last several months.  We will give her a short course of Oxy 5 mg every 6 hours PRN for 5 days to get her to her ENT appointment.  Discussed that we will not be refilling this and this is only short-term medication.  Patient agreeable to plan.

## 2022-01-11 NOTE — Progress Notes (Signed)
CC: follow up or left neck mass  HPI:  [name] is a 61 y.o. F presents for CT neck results for left neck mass with associated pain. Please refer to problem based charting for further details and assessment and plan of current problem and chronic medical conditions.   Past Medical History:  Diagnosis Date   Anxiety    Arthritis    hands   Asthma    2 sets of PFT's in 04/09 without sign variability. Last set with significant decrease in FEV1 with saline alone, suggesting Asthma but  recommended clinical corelation    Asthma    Bipolar 1 disorder [facility])    therapist is Joy and is followed by [facility]   Blackout    negative work up including ESR, ANA, opthalmology referral, carotid dopplers, 2D echo, MRI and EEG.   BREAST LUMP 03/25/2008   Annotation: bilaterally Qualifier: Diagnosis of  By: Tomasa Hosteller MD, Edmon Crape.    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 Further work up includes CRP, ESR, SPEP/UPEP, hepatitis erology, A1C , repeat ANA   COPD (chronic obstructive pulmonary disease) ([place])    COPD exacerbation ([place]) 03/31/2019   Depression    DUB (dysfunctional uterine bleeding)    and pelvic pain, with negative endometrial bx in 07/09 and transvaginal U/S significant for mild fibroids in 0/09.   Emphysema of lung ([place])    GERD (gastroesophageal reflux disease)    Hallucinations 03/18/2008   Qualifier: Diagnosis of  By: Redmond Pulling  MD, Mateo Flow     Hyperlipidemia    Lower extremity edema    Neg ABI's, normal 2D echo, normal albumin   Menorrhagia    Ovarian cyst    Polysubstance abuse ([place])    none since March 17,2009.   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    Appearance: Normal appearance.  HENT:     Head: Normocephalic and atraumatic.     Mouth/Throat:     Mouth: Mucous membranes are moist.     Pharynx: Oropharynx is clear.     Comments: Mass of the left submandibular area Cardiovascular:     Rate and Rhythm: Normal rate and regular rhythm.  Pulmonary:     Effort: Pulmonary effort is normal.     Breath sounds: Normal breath sounds.  Abdominal:     General: Abdomen is flat. Bowel sounds are normal.     Palpations: Abdomen is soft.  Musculoskeletal:        General: Normal range of motion.  Skin:    General: Skin is warm and  dry.     Capillary Refill: Capillary refill takes less than 2 seconds.  Neurological:     General: No focal deficit present.     Mental Status: She is alert and oriented to person, place, and time.     Assessment & Plan:   See Encounters Tab for problem based charting.  Patient discussed with Dr. Dareen Piano

## 2022-01-18 ENCOUNTER — Telehealth: Payer: Self-pay | Admitting: Pharmacist

## 2022-01-18 NOTE — Telephone Encounter (Signed)
Patient was scheduled with pharmacy clinic for first Xolair injection tomrrow, 01/19/22 at 3:20p. Unsure who scheduled this and when she was scheduled. First injection requires 2 hours of monitoring. Patient can only be scheduled in a 1:20 or 2:20p time slow. Tomorrow's appt has been cancelled.  ATC patient x 5 but phone rings busy.   Per chart review, patient has salivary stone that is still being managed by internal medicine clinic. Patient's initial first dose of Xolair was cancelled d/t to swelling in neck. Routing to Dr. Silas Flood for clearance to continue with Xolair of if he'd like to see her in clinic before deciding on plan.  Knox Saliva, PharmD, MPH, BCPS Clinical Pharmacist (Rheumatology and Pulmonology)

## 2022-01-19 ENCOUNTER — Other Ambulatory Visit: Payer: Medicaid Other | Admitting: Pharmacist

## 2022-01-20 ENCOUNTER — Encounter: Payer: Self-pay | Admitting: Pulmonary Disease

## 2022-01-20 NOTE — Telephone Encounter (Signed)
Lets hold on Xolair injections until she sees ENT later this month.    April, can we get her scheduled to see me sometime late March and decide Xolair injections at that time?

## 2022-01-22 NOTE — Telephone Encounter (Signed)
Called patient and she was at the dentist and could not talk.

## 2022-01-25 ENCOUNTER — Other Ambulatory Visit (HOSPITAL_COMMUNITY): Payer: Self-pay

## 2022-01-26 NOTE — Progress Notes (Signed)
Internal Medicine Clinic Attending ? ?Case discussed with Dr. Liang  At the time of the visit.  We reviewed the resident?s history and exam and pertinent patient test results.  I agree with the assessment, diagnosis, and plan of care documented in the resident?s note. ? ?

## 2022-01-26 NOTE — Telephone Encounter (Signed)
Called and spoke with patient and got her scheduled for March 28th at 2:30pm with Dr Silas Flood.   Nothing further

## 2022-02-05 ENCOUNTER — Emergency Department (HOSPITAL_COMMUNITY): Payer: Medicaid Other

## 2022-02-05 ENCOUNTER — Encounter (HOSPITAL_COMMUNITY): Payer: Self-pay | Admitting: Emergency Medicine

## 2022-02-05 ENCOUNTER — Other Ambulatory Visit: Payer: Self-pay

## 2022-02-05 ENCOUNTER — Emergency Department (HOSPITAL_COMMUNITY)
Admission: EM | Admit: 2022-02-05 | Discharge: 2022-02-05 | Discharge status: Against Medical Advice | Payer: Medicaid Other | Attending: Emergency Medicine | Admitting: Emergency Medicine

## 2022-02-05 DIAGNOSIS — J45909 Unspecified asthma, uncomplicated: Secondary | ICD-10-CM | POA: Insufficient documentation

## 2022-02-05 DIAGNOSIS — Z5321 Procedure and treatment not carried out due to patient leaving prior to being seen by health care provider: Secondary | ICD-10-CM | POA: Insufficient documentation

## 2022-02-05 DIAGNOSIS — R0602 Shortness of breath: Secondary | ICD-10-CM | POA: Diagnosis present

## 2022-02-05 MED ORDER — IPRATROPIUM-ALBUTEROL 0.5-2.5 (3) MG/3ML IN SOLN
3.0000 mL | Freq: Once | RESPIRATORY_TRACT | Status: AC
  Administered 2022-02-05: 3 mL via RESPIRATORY_TRACT
  Filled 2022-02-05: qty 3

## 2022-02-05 MED ORDER — ALBUTEROL SULFATE HFA 108 (90 BASE) MCG/ACT IN AERS
2.0000 | INHALATION_SPRAY | RESPIRATORY_TRACT | Status: DC | PRN
  Administered 2022-02-05: 2 via RESPIRATORY_TRACT
  Filled 2022-02-05: qty 6.7

## 2022-02-05 NOTE — ED Notes (Signed)
Patient told to please go back to the waiting room after being triaged. Patient said "I am just going home I am not going out in that waiting room again." Patient told that we would be running tests and getting blood work. Patient still said I dont care I am leaving bye.

## 2022-02-05 NOTE — ED Triage Notes (Signed)
Pt reports SHOB since Wednesday, worsening due to her asthma. Denies inhaler usage.

## 2022-02-05 NOTE — ED Provider Triage Note (Signed)
Emergency Medicine Provider Triage Evaluation Note  Kathleen [name] , a 60 y.o. female  was evaluated in triage.  Pt complains of shortness of breath since Wednesday.  Reports albuterol inhaler helps.  States she was sick with gastroenteritis last week and still has residual vomiting.  Reports fever yesterday of 102.  Denies chest pain or abdominal pain.  Review of Systems  Positive: As above Negative: As above  Physical Exam  BP (!) 152/133    Pulse 100    Temp 98.6 F (37 C)    Resp (!) 22    LMP 11/08/2014    SpO2 96%  Gen:   Awake, no distress   Resp:  Normal effort.  Expiratory wheezing appreciated. MSK:   Moves extremities without difficulty  Other:    Medical Decision Making  Medically screening exam initiated at 7:28 PM.  Appropriate orders placed.  Kathleen [name] was informed that the remainder of the evaluation will be completed by another provider, this initial triage assessment does not replace that evaluation, and the importance of remaining in the ED until their evaluation is complete.     Evlyn Courier, PA-C 02/05/22 1930

## 2022-03-09 ENCOUNTER — Encounter: Payer: Self-pay | Admitting: Pulmonary Disease

## 2022-03-09 ENCOUNTER — Other Ambulatory Visit: Payer: Self-pay

## 2022-03-09 ENCOUNTER — Ambulatory Visit (INDEPENDENT_AMBULATORY_CARE_PROVIDER_SITE_OTHER): Payer: Medicaid Other | Admitting: Pulmonary Disease

## 2022-03-09 DIAGNOSIS — J441 Chronic obstructive pulmonary disease with (acute) exacerbation: Secondary | ICD-10-CM | POA: Diagnosis not present

## 2022-03-09 DIAGNOSIS — J45901 Unspecified asthma with (acute) exacerbation: Secondary | ICD-10-CM

## 2022-03-09 DIAGNOSIS — Z87892 Personal history of anaphylaxis: Secondary | ICD-10-CM | POA: Diagnosis not present

## 2022-03-09 DIAGNOSIS — J449 Chronic obstructive pulmonary disease, unspecified: Secondary | ICD-10-CM

## 2022-03-09 DIAGNOSIS — J4489 Other specified chronic obstructive pulmonary disease: Secondary | ICD-10-CM

## 2022-03-09 MED ORDER — SPIRIVA HANDIHALER 18 MCG IN CAPS: 18.0000 ug | ORAL_CAPSULE | Freq: Every day | RESPIRATORY_TRACT | 6 refills | Status: DC

## 2022-03-09 MED ORDER — DULERA 200-5 MCG/ACT IN AERO: 2.0000 | INHALATION_SPRAY | Freq: Two times a day (BID) | RESPIRATORY_TRACT | 11 refills | Status: DC

## 2022-03-09 MED ORDER — EPINEPHRINE 0.3 MG/0.3ML IJ SOAJ: 0.3000 mg | INTRAMUSCULAR | 3 refills | Status: DC | PRN

## 2022-03-09 MED ORDER — MONTELUKAST SODIUM 10 MG PO TABS: 10.0000 mg | ORAL_TABLET | Freq: Every day | ORAL | 3 refills | Status: DC

## 2022-03-09 MED ORDER — ALBUTEROL SULFATE HFA 108 (90 BASE) MCG/ACT IN AERS: 1.0000 | INHALATION_SPRAY | Freq: Four times a day (QID) | RESPIRATORY_TRACT | 6 refills | Status: DC | PRN

## 2022-03-09 MED ORDER — LORATADINE 10 MG PO TABS: 10.0000 mg | ORAL_TABLET | Freq: Every day | ORAL | 11 refills | Status: DC

## 2022-03-09 NOTE — Patient Instructions (Addendum)
Ncie to see you again ? ?I refilled the medications as requested ? ?I sent a script for montelukast and claritin - resume these medications and see if it helps with the rash in the sun ? ?Stop by the front desk and request appointment with Knox Saliva, RPH at 220 pm for first xolair injection ? ?RTC in 3 months or sooner as needed ?

## 2022-03-09 NOTE — Telephone Encounter (Signed)
Patient scheduled for Xolair first dose tomorrow, 03/10/22 ? ?Knox Saliva, PharmD, MPH, BCPS ?Clinical Pharmacist (Rheumatology and Pulmonology) ?

## 2022-03-09 NOTE — Progress Notes (Signed)
? ?_0  ID: Kathleen [name], female    DOB: [birth year], 61 y.o.   MRN: [id number] ? ?Chief Complaint  ?Patient presents with  ? Follow-up  ?  Here to discuss Xolair with MH   ? ? ?Referring provider: ?Katsadouros, Vasilios, * ? ?HPI:  ? ?61 y.o. whom we are seeing in follow-up of dyspnea exertion felt to be related to poorly controlled asthma.  Multiple PCP notes reviewed.   ? ?Last seen by me many months ago in the setting of new start Xolair injection.  Unfortunate that time she had significant swelling of her neck and oropharynx reported dysphagia.  She instructed go to the ED given concern for deep space infection.  She never did this.  Prescribed Augmentin.  Took some of Anoro.  Long story short through multiple referrals turns out she has stones in her salivary glands.  Has a follow-up appoint with ENT to address this.  Also in the interim she had multiple teeth pulled due to infection, chronic decay.  Still has some dysphagia.  Her breathing seems actually pretty stable in the interim.  She reports good adherence to her inhaler therapy.  Discussed at length again the role and rationale for biologic therapy which I do think is reasonable to move forward with even a sign of well-controlled symptoms recently given her historically poorly controlled disease and need for recurrent courses of prednisone. ? ?HPI initial visit: ?Patient has longstanding history of dyspnea exertion, wheeze, chest tightness.  Worse with temperature changes, hot and cold.  Unclear if seasonal changes,, allergies, pollens contribute.  She seemed to indicate breathing does tend to get worse when her allergy symptoms flare.  She has significant atopic symptoms including anaphylaxis to bee stings.  Breaks out in with the sound like hives or eczema to multiple different triggers.  No time during day when her symptoms dyspnea is better or worse.  Uses Dulera which seems to be the most effective inhaled agent for her symptoms.  In addition  she takes [place] daily, Qvar, Symbicort, Anoro and uses as needed albuterol.  Mild cough.  No environmental changes that she can identify the triggers breathing elsewise.  No other alleviating or exacerbating factors. ? ?3 most recent chest x-rays personally reviewed and all interpreted as clear lungs, no effusion, pneumothorax, infiltrate.  Overall look the same. ? ?PMH: Asthma, hyperlipidemia, seasonal allergies, headaches, sleep apnea ?Surgical history: Reviewed with patient, denies any ?Family history: First relatives with emphysema, seasonal allergies, asthma, cancer ?Social history: Lives in [place], former cigarette smoking, approximately 40-pack-year history, used to smoke marijuana but quit several months ago ? ?Questionaires / Pulmonary Flowsheets:  ? ?ACT:  ?   ? View : No data to display.  ?  ?  ?  ? ? ?MMRC: ?mMRC Dyspnea Scale mMRC Score  ?08/27/2021 ?10:39 AM 1  ? ? ?Epworth:  ?   ? View : No data to display.  ?  ?  ?  ? ? ?Tests:  ? ?FENO:  ?No results found for: NITRICOXIDE ? ?PFT: ? ?  Latest Ref Rng & Units 07/17/2021  ?  1:01 PM  ?PFT Results  ?FVC-Pre L 2.32    ?FVC-Predicted Pre % 95    ?FVC-Post L 2.61    ?FVC-Predicted Post % 107    ?Pre FEV1/FVC % % 70    ?Post FEV1/FCV % % 70    ?FEV1-Pre L 1.62    ?FEV1-Predicted Pre % 84    ?FEV1-Post L 1.83    ?  DLCO uncorrected ml/min/mmHg 15.06    ?DLCO UNC% % 79    ?DLCO corrected ml/min/mmHg 14.44    ?DLCO COR %Predicted % 76    ?DLVA Predicted % 74    ?TLC L 5.45    ?TLC % Predicted % 114    ?RV % Predicted % 130    ?Personally reviewed interpreted as normal spirometry, significant bronchodilator response in both FEV1 and FVC, TLC within the limits, gas trapping given RV to TLC ratio is elevated, normal DLCO.  Consistent with asthma. ? ?WALK:  ?   ? View : No data to display.  ?  ?  ?  ? ? ?Imaging: ?Personally reviewed and as per EMR discussion this note ? ? ?Lab Results: ?Personally reviewed, IgE 2606 June 2022, eosinophils 600 June 2022 ?CBC ?    ?Component Value Date/Time  ? WBC 6.8 10/30/2021 1349  ? RBC 4.78 10/30/2021 1349  ? HGB 13.6 10/30/2021 1349  ? HGB 13.6 08/09/2019 1538  ? HCT 42.5 10/30/2021 1349  ? HCT 41.5 08/09/2019 1538  ? PLT 292 10/30/2021 1349  ? PLT 355 08/09/2019 1538  ? MCV 88.9 10/30/2021 1349  ? MCV 86 08/09/2019 1538  ? MCH 28.5 10/30/2021 1349  ? MCHC 32.0 10/30/2021 1349  ? RDW 13.6 10/30/2021 1349  ? RDW 12.5 08/09/2019 1538  ? LYMPHSABS 2.2 10/30/2021 1349  ? LYMPHSABS 2.9 08/09/2019 1538  ? MONOABS 0.5 10/30/2021 1349  ? EOSABS 0.3 10/30/2021 1349  ? EOSABS 0.2 08/09/2019 1538  ? BASOSABS 0.0 10/30/2021 1349  ? BASOSABS 0.1 08/09/2019 1538  ? ? ?BMET ?   ?Component Value Date/Time  ? NA 140 10/30/2021 1349  ? NA 142 10/21/2021 0945  ? K 3.4 (L) 10/30/2021 1349  ? CL 108 10/30/2021 1349  ? CO2 26 10/30/2021 1349  ? GLUCOSE 65 (L) 10/30/2021 1349  ? BUN 10 10/30/2021 1349  ? BUN 11 10/21/2021 0945  ? CREATININE 1.00 01/04/2022 1124  ? CREATININE 1.01 05/24/2014 1140  ? CALCIUM 8.8 (L) 10/30/2021 1349  ? GFRNONAA >60 10/30/2021 1349  ? GFRNONAA 64 05/24/2014 1140  ? GFRAA >60 08/14/2020 1102  ? GFRAA 73 05/24/2014 1140  ? ? ?BNP ?   ?Component Value Date/Time  ? BNP 41.7 10/30/2021 1349  ? ? ?ProBNP ?   ?Component Value Date/Time  ? PROBNP 69.0 04/29/2008 1933  ? ? ?Specialty Problems   ? ?  ? Pulmonary Problems  ? Asthma-COPD overlap syndrome ([place])  ? ? ?Allergies  ?Allergen Reactions  ? Bee Venom   ? Peach [Prunus Persica] Other (See Comments)  ?  Throat swells , breakout  ? Topiramate Other (See Comments)  ?  Reaction:  Hallucinations   ? Tramadol Nausea Only  ? ? ?Immunization History  ?Administered Date(s) Administered  ? Influenza Whole 10/08/2008, 11/25/2009  ? PFIZER(Purple Top)SARS-COV-2 Vaccination 03/20/2020, 04/10/2020  ? Pneumococcal Polysaccharide-23 11/25/2009  ? Td 11/25/2009  ? Tdap 07/08/2016  ? ? ?Past Medical History:  ?Diagnosis Date  ? Anxiety   ? Arthritis   ? hands  ? Asthma   ? 2 sets of PFT's in 04/09  without sign variability. Last set with significant decrease in FEV1 with saline alone, suggesting Asthma but  recommended clinical corelation   ? Asthma   ? Bipolar 1 disorder ([place])   ? therapist is Caryl Asp and is followed by [facility]  ? Blackout   ? negative work up including ESR, ANA, opthalmology referral, carotid dopplers, 2D echo,  MRI and EEG.  ? BREAST LUMP 03/25/2008  ? Annotation: bilaterally Qualifier: Diagnosis of  By: Tomasa Hosteller MD, Edmon Crape.   ? Breast mass in female   ? s/p mammogram, u/s and biopsy in 05/09 c/w fibroadenoma,  ? Bronchitis   ? Chronic headache   ? Chronic low back pain 08/08/2008  ? Qualifier: Diagnosis of  By: Redmond Pulling  MD, Mateo Flow    ? Chronic pain   ? normal work up including TSH, RPR, B12, HIV, plain films, 2 ESR's, ANA, CK, RF along with routine CBC, CMET and UA. Further work up includes CRP, ESR, SPEP/UPEP, hepatitis erology, A1C , repeat ANA  ? COPD (chronic obstructive pulmonary disease) ([place])   ? COPD exacerbation ([place]) 03/31/2019  ? Depression   ? DUB (dysfunctional uterine bleeding)   ? and pelvic pain, with negative endometrial bx in 07/09 and transvaginal U/S significant for mild fibroids in 0/09.  ? Emphysema of lung ([place])   ? GERD (gastroesophageal reflux disease)   ? Hallucinations 03/18/2008  ? Qualifier: Diagnosis of  By: Redmond Pulling  MD, Mateo Flow    ? Hyperlipidemia   ? Lower extremity edema   ? Neg ABI's, normal 2D echo, normal albumin  ? Menorrhagia   ? Ovarian cyst   ? Polysubstance abuse ([place])   ? none since March 17,2009.  ? Sleep apnea   ? NO CPAP  ? Stroke [facility])   ? 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 Tubular adenoma of colon   ? ? ?Tobacco History: ?Social History  ? ?Tobacco Use  ?Smoking Status Former  ? Packs/day: 0.25  ? Years: 30.00  ? Pack years: 7.50  ? Types: Cigarettes  ? Quit date: 12/28/2016  ? Years since quitting: 5.1  ?Smokeless Tobacco Never  ?Tobacco Comments  ? Occ. Wants Nicotine Patches   ? ?Counseling given: Not  Answered ?Tobacco comments: Occ. Wants Nicotine Patches  ? ? ?Continue to not smoke ? ?Outpatient Encounter Medications as of 03/09/2022  ?Medication Sig  ? ASPIRIN ADULT LOW STRENGTH 81 MG EC tablet TAKE 1 Tabl

## 2022-03-10 ENCOUNTER — Other Ambulatory Visit: Payer: Medicaid Other | Admitting: Pharmacist

## 2022-03-10 ENCOUNTER — Other Ambulatory Visit (HOSPITAL_COMMUNITY): Payer: Self-pay

## 2022-03-10 NOTE — Telephone Encounter (Signed)
Patient unable to secure Epipen since she has maxed out what Medicaid will cover - 6 pens in 180 days. She will be reaching back out once she is able to fill - Epipen is required for Xolair ? ?Knox Saliva, PharmD, MPH, BCPS ?Clinical Pharmacist (Rheumatology and Pulmonology)  ?

## 2022-03-16 DIAGNOSIS — K1120 Sialoadenitis, unspecified: Secondary | ICD-10-CM | POA: Insufficient documentation

## 2022-03-18 ENCOUNTER — Encounter (HOSPITAL_COMMUNITY): Payer: Self-pay

## 2022-03-18 ENCOUNTER — Other Ambulatory Visit: Payer: Self-pay

## 2022-03-18 ENCOUNTER — Emergency Department (HOSPITAL_COMMUNITY)
Admission: EM | Admit: 2022-03-18 | Discharge: 2022-03-18 | Disposition: A | Discharge status: Home / Self Care | Payer: Medicaid Other | Attending: Emergency Medicine | Admitting: Emergency Medicine

## 2022-03-18 DIAGNOSIS — R221 Localized swelling, mass and lump, neck: Secondary | ICD-10-CM | POA: Diagnosis present

## 2022-03-18 DIAGNOSIS — K1120 Sialoadenitis, unspecified: Secondary | ICD-10-CM

## 2022-03-18 DIAGNOSIS — Z7982 Long term (current) use of aspirin: Secondary | ICD-10-CM | POA: Insufficient documentation

## 2022-03-18 MED ORDER — OXYCODONE-ACETAMINOPHEN 5-325 MG PO TABS
2.0000 | ORAL_TABLET | Freq: Once | ORAL | Status: AC
  Administered 2022-03-18: 2 via ORAL
  Filled 2022-03-18: qty 2

## 2022-03-18 MED ORDER — CEPHALEXIN 500 MG PO CAPS: 500.0000 mg | ORAL_CAPSULE | Freq: Four times a day (QID) | ORAL | 0 refills | Status: DC

## 2022-03-18 MED ORDER — KETOROLAC TROMETHAMINE 30 MG/ML IJ SOLN
30.0000 mg | Freq: Once | INTRAMUSCULAR | Status: AC
  Administered 2022-03-18: 30 mg via INTRAVENOUS
  Filled 2022-03-18: qty 1

## 2022-03-18 MED ORDER — CEFTRIAXONE SODIUM 1 G IJ SOLR
500.0000 mg | Freq: Once | INTRAMUSCULAR | Status: AC
  Administered 2022-03-18: 500 mg via INTRAMUSCULAR
  Filled 2022-03-18: qty 10

## 2022-03-18 MED ORDER — STERILE WATER FOR INJECTION IJ SOLN
INTRAMUSCULAR | Status: AC
  Filled 2022-03-18: qty 10

## 2022-03-18 MED ORDER — IBUPROFEN 800 MG PO TABS: 800.0000 mg | ORAL_TABLET | Freq: Once | ORAL | Status: DC

## 2022-03-18 MED ORDER — CEPHALEXIN 250 MG PO CAPS
250.0000 mg | ORAL_CAPSULE | Freq: Once | ORAL | Status: DC
Start: 2022-03-18 — End: 2022-03-19

## 2022-03-18 MED ORDER — ONDANSETRON 8 MG PO TBDP
8.0000 mg | ORAL_TABLET | Freq: Once | ORAL | Status: AC
  Administered 2022-03-18: 8 mg via ORAL
  Filled 2022-03-18: qty 1

## 2022-03-18 NOTE — ED Triage Notes (Signed)
Patient said she has 2 neck stones on the left side of her neck. She said she cannot swallow and is in pain. She is supposed to get them in 2 weeks because it was not bothering her previously. But tonight it is. She needs something for pain. ?

## 2022-03-18 NOTE — Discharge Instructions (Signed)
Please continue tart candy such as lemon drops ?Take antibiotics and ibuprofen as needed for pain ?Follow up with your ENT tomorrow if not better ?

## 2022-03-18 NOTE — ED Notes (Addendum)
Patient vomited clear liquid into floor.  No whole pills seen in vomitus.  Patient states "they should have given me something for pain in my arm because I'm throwing up everything I eat and drink."  ?

## 2022-03-18 NOTE — ED Provider Notes (Signed)
?[facility] DEPT ?Provider Note ? ? ?CSN: [id number] ?Arrival date & time: 03/18/22  2000 ? ?  ? ?History ? ?Chief Complaint  ?Patient presents with  ? Neck Pain  ? ? ?Kathleen [name] is a 61 y.o. female. ? ?HPI ?61 year old female known history of sialoadenitis presents today complaining of pain in the left submandibular area.  She states that she was seen by the ENT.  She is scheduled to have the stones removed.  She has been having intermittent swelling.  Today she had swelling in the left submandibular area and tried some sour pickles and candy.  The swelling has decreased but she continues to have pain.  She has not noted any redness, pus, or discharge.  Has not had fever or chills. ? ?  ? ?Home Medications ?Prior to Admission medications   ?Medication Sig Start Date End Date Taking? Authorizing Provider  ?cephALEXin (KEFLEX) 500 MG capsule Take 1 capsule (500 mg total) by mouth 4 (four) times daily. 03/18/22  Yes [name], MD  ?albuterol (VENTOLIN HFA) 108 (90 Base) MCG/ACT inhaler Inhale 1-2 puffs into the lungs every 6 (six) hours as needed for wheezing or shortness of breath. 03/09/22   Hunsucker, [name], MD  ?ASPIRIN ADULT LOW STRENGTH 81 MG EC tablet TAKE 1 Tablet BY MOUTH ONCE DAILY ?Patient taking differently: Take 81 mg by mouth daily. 12/03/19   [name], MD  ?[name] 200-5 MCG/ACT AERO Inhale 2 puffs into the lungs in the morning and at bedtime. 03/09/22   Hunsucker, [name], MD  ?EPINEPHrine (EPIPEN 2-PAK) 0.3 mg/0.3 mL IJ SOAJ injection Inject 0.3 mg into the muscle as needed for anaphylaxis. 03/09/22   Hunsucker, [name], MD  ?fluticasone (FLONASE) 50 MCG/ACT nasal spray Place 1 spray into both nostrils daily. 03/26/21 03/26/22  [name], MD  ?HYDROcodone-acetaminophen (NORCO/VICODIN) 5-325 MG tablet Take 1 tablet by mouth every 4 (four) hours as needed. 03/17/21   [name], MD  ?lidocaine (LIDODERM) 5 % Place 1 patch onto the skin daily. Remove &  Discard patch within 12 hours or as directed by MD 01/07/22   [name], MD  ?loratadine (CLARITIN) 10 MG tablet Take 1 tablet (10 mg total) by mouth daily. 03/09/22   Hunsucker, [name], MD  ?montelukast (SINGULAIR) 10 MG tablet Take 1 tablet (10 mg total) by mouth at bedtime. 03/09/22   Hunsucker, [name], MD  ?omalizumab [name]) 150 MG/ML prefilled syringe Inject 300 mg into the skin every 14 (fourteen) days. TOTAL DOSE = '375mg'$  every 14 days) 09/21/21   Hunsucker, [name], MD  ?omalizumab [name]) 75 MG/0.5ML prefilled syringe Inject 75 mg into the skin every 14 (fourteen) days. TOTAL DOSE = '375mg'$  every 14 days 09/21/21   Hunsucker, [name], MD  ?ondansetron (ZOFRAN ODT) 4 MG disintegrating tablet Take 1 tablet (4 mg total) by mouth every 8 (eight) hours as needed for nausea or vomiting. 01/09/22   [name], MD  ?ondansetron (ZOFRAN) 4 MG tablet TAKE 1 TABLET BY MOUTH ONCE DAILY AS NEEDED FOR NAUSEA FOR VOMITING 02/18/21   [name], MD  ?oxyCODONE (ROXICODONE) 5 MG immediate release tablet Take 1 tablet (5 mg total) by mouth every 6 (six) hours as needed for breakthrough pain. Short course until she follow with ENT. Not to be refilled 01/09/22   [name], MD  ?pantoprazole (PROTONIX) 40 MG tablet Take 1 tablet (40 mg total) by mouth daily. 08/27/21   [name], MD  ?tiotropium [facility])  18 MCG inhalation capsule Place 1 capsule (18 mcg total) into inhaler and inhale daily. 03/09/22 04/08/22  Hunsucker, [name], MD  ?divalproex (DEPAKOTE ER) 500 MG 24 hr tablet Take 1 tablet (500 mg total) by mouth 2 (two) times daily. ?Patient not taking: Reported on 10/08/2019 08/09/19 03/23/20  [name], MD  ?ipratropium-albuterol (DUONEB) 0.5-2.5 (3) MG/3ML SOLN Take 3 mLs by nebulization every 6 (six) hours as needed (wheezing or shortness of breath). ?Patient not taking: Reported on 03/19/2020 06/09/19 03/23/20  [name], MD  ?metoCLOPramide (REGLAN) 5 MG  tablet Take 1 tablet (5 mg total) by mouth 3 (three) times daily before meals for 5 days. ?Patient not taking: Reported on 03/19/2020 07/17/19 03/23/20  [name], MD  ?SUMAtriptan (IMITREX) 25 MG tablet Take 1 tablet (25 mg total) by mouth every 2 (two) hours as needed for migraine or headache. ?Patient not taking: Reported on 10/08/2019 07/24/19 03/23/20  [name], DO  ?   ? ?Allergies    ?Bee venom, Peach [prunus persica], Topiramate, and Tramadol   ? ?Review of Systems   ?Review of Systems  ?All other systems reviewed and are negative. ? ?Physical Exam ?Updated Vital Signs ?BP (!) 131/92 (BP Location: Right Arm)   Pulse 91   Temp 98.9 ?F (37.2 ?C) (Oral)   Resp 19   Ht 1.6 m ('5\' 3"'$ )   Wt 64 kg   LMP 11/08/2014   SpO2 100%   BMI 24.98 kg/m?  ?Physical Exam ?Vitals reviewed.  ?Constitutional:   ?   General: She is in acute distress.  ?   Appearance: Normal appearance. She is not ill-appearing.  ?HENT:  ?   Head: Normocephalic.  ?   Right Ear: External ear normal.  ?   Left Ear: External ear normal.  ?   Nose: Nose normal.  ?   Mouth/Throat:  ?   Mouth: Mucous membranes are moist.  ?   Comments: Left submandibular area palpated and diffusely tender but no swelling noted ?Visualized intraoral area and no stone or pus noted at ducts ?Eyes:  ?   Extraocular Movements: Extraocular movements intact.  ?   Pupils: Pupils are equal, round, and reactive to light.  ?Cardiovascular:  ?   Rate and Rhythm: Normal rate.  ?   Pulses: Normal pulses.  ?Pulmonary:  ?   Effort: Pulmonary effort is normal.  ?Musculoskeletal:  ?   Cervical back: Normal range of motion.  ?Skin: ?   General: Skin is warm.  ?   Capillary Refill: Capillary refill takes less than 2 seconds.  ?Neurological:  ?   General: No focal deficit present.  ?   Mental Status: She is alert.  ? ? ?ED Results / Procedures / Treatments   ?Labs ?(all labs ordered are listed, but only abnormal results are displayed) ?Labs Reviewed - No data to  display ? ?EKG ?None ? ?Radiology ?No results found. ? ?Procedures ?Procedures  ? ? ?Medications Ordered in ED ?Medications  ?ibuprofen (ADVIL) tablet 800 mg (800 mg Oral Not Given 03/18/22 2129)  ?cephALEXin (KEFLEX) capsule 250 mg (250 mg Oral Not Given 03/18/22 2128)  ?oxyCODONE-acetaminophen (PERCOCET/ROXICET) 5-325 MG per tablet 2 tablet (2 tablets Oral Given 03/18/22 2038)  ?ondansetron (ZOFRAN-ODT) disintegrating tablet 8 mg (8 mg Oral Given 03/18/22 2104)  ?ketorolac (TORADOL) 30 MG/ML injection 30 mg (30 mg Intravenous Given 03/18/22 2138)  ?cefTRIAXone (ROCEPHIN) injection 500 mg (500 mg Intramuscular Given 03/18/22 2138)  ?sterile water (preservative free) injection (  Given 03/18/22 2143)  ? ? ?ED Course/ Medical Decision Making/ A&P ?  ?                        ?Medical Decision Making ?-Patient with known sialoadenitis presents today complaining of worsening pain.  No swelling, redness, or abnormal discharge is noted.  Although the differential diagnosis of stones includes infection viral or bacterial, patient does not have any signs or symptoms of infection on my exam here.  However given nondistended increase in pain, will treat with antibiotics patient has follow-up for with ENT already scheduled ?Will review medications for any with anticholinergic effects ?Received 2 Percocet and then reported that she vomited.  Patient received IM Toradol and Rocephin.  Patient is now resting comfortably ?Plan outpatient Keflex ? ?Amount and/or Complexity of Data Reviewed ?External Data Reviewed: radiology. ?   Details: From recent CT at [facility] of 02/11/2022 the oropharynx and oral cavity are normal. The parapharyngeal spaces are clear. Asymmetric bilateral submandibular glands, right being larger than the left. There is a prominent intraglandular ductal dilatation of bilateral submandibular glands. There is a 2 mm calcific density within the intraglandular portion of the duct of the left submandibular gland (7:54). Additional 4 mm  calcific density is noted along the anterior medial margin of the left submandibular gland (7:48) ?No definite inflammatory changes adjacent to the left submandibular gland. Mildly prominent bilateral submandibular lymph nodes

## 2022-03-18 NOTE — ED Notes (Addendum)
Patient given warm blanket.  Patient reports she vomited up pain medicine into the trash can and reports she cannot take pills.  Nothing visibly seen in the trash, MD Ray made aware.  ?

## 2022-03-28 ENCOUNTER — Telehealth: Payer: Self-pay | Admitting: Internal Medicine

## 2022-03-28 DIAGNOSIS — K115 Sialolithiasis: Secondary | ICD-10-CM

## 2022-03-28 DIAGNOSIS — R590 Localized enlarged lymph nodes: Secondary | ICD-10-CM

## 2022-03-28 MED ORDER — OXYCODONE HCL 5 MG PO TABS: 5.0000 mg | ORAL_TABLET | Freq: Four times a day (QID) | ORAL | 0 refills | Status: AC | PRN

## 2022-03-28 NOTE — Telephone Encounter (Signed)
Dr. Darrick Meigs unable to send in prescription, I sent in for her. PDMP reviewed and appropriate.  ?

## 2022-03-28 NOTE — Telephone Encounter (Signed)
?  INTERNAL MEDICINE [name] URGENT LINE DOCUMENTATION ? ?Patient name: Kathleen [name]  ?DOB: 03/28/1961 ?MRN: [id number] ? ?Reason for call:  ?31 yof who has been following with [name] for sialoadenitis who called this morning for oxycodone refill. She notes that she has been scheduled for surgery on 4/26, but has been struggling with ongoing pain in the interm. She was seen in the [name] on 4/6, and was prescribed a course of antibiotics. She received oxycodone '5mg'$  #10 from her ENT on 4/12. She denies fevers, chills, inability to handle oral secretions, difficulty breathing, or neck swelling.  ? ? ?Assessment/Plan  ?I explained the limitations to recommendations based on telephone visits. Based on her report, suspicion for infection is low. Will send in another short course oxycodone for her. She was instructed to follow up in our clinic prior to seeking out another refill or if she develops fevers/chills. She was instructed to seek emergent evaluation if she develops difficulty with handling oral secretions, difficulty breathing, or neck swelling. She is agreeable with the above plan and will call the clinic to schedule an appointment later next week.  ? ?As always, patients are encouraged to seek further evaluation in the ED or urgent care if symptoms worsen prior to being seen in the clinic. Patients are encouraged to use this line for urgent needs only and advice does not replace a formal evaluation by a medical provider. If symptoms are considered life threatening, they are encouraged to call 911. For all other non-urgent needs, they are encouraged to call between regular clinic hours. ? ?Kathleen Hansen, MD ?Internal Medicine Resident PGY-3 ?[name] ?03/28/2022 8:55 AM  ?   ?

## 2022-03-31 ENCOUNTER — Other Ambulatory Visit: Payer: Self-pay

## 2022-03-31 DIAGNOSIS — R590 Localized enlarged lymph nodes: Secondary | ICD-10-CM

## 2022-03-31 DIAGNOSIS — K115 Sialolithiasis: Secondary | ICD-10-CM

## 2022-03-31 NOTE — Telephone Encounter (Signed)
Dr. Darrick Meigs called her in pain medication after a phone visit over the weekend and instructed her to follow up in person in the clinic prior to another refill. It seems as though she has had multiple since being seen in the ED. She called her surgeon and asked for pain medication refills as well. I would prefer her be seen in our clinic prior to another refill.  ?

## 2022-04-02 ENCOUNTER — Telehealth: Payer: Self-pay | Admitting: Pulmonary Disease

## 2022-04-02 NOTE — Telephone Encounter (Signed)
Called and spoke with patient's pharmacy to see if they received the several prescriptions that were sent in for on her 03/09/22. They confirmed they did and that she picked up the Epi pen and Dulera already. Called and spoke with patient to see if she needs refill on all her other medications what were sent in and she said yes. I called the pharmacy back to let them know that she needs refills on all the other medications. They expressed understanding. Nothing further needed at this time. ?

## 2022-04-04 ENCOUNTER — Telehealth: Payer: Self-pay | Admitting: Student

## 2022-04-04 NOTE — Telephone Encounter (Signed)
Patient called after hours pager requesting more pain medication. States she did not follow up in clinic as instructed by Dr. Johnney Ou or Dr. Andi Hence. States she is currently in [place]. Has ENT follow up next week. No difficutly in swallowing and tolerating PO intake. Discussed need to follow up in clinic prior refilling pain medications. Patient states she will go to ED and hung up.  ?

## 2022-04-11 ENCOUNTER — Telehealth: Payer: Self-pay | Admitting: Internal Medicine

## 2022-04-11 NOTE — Telephone Encounter (Signed)
?  [facility] URGENT LINE DOCUMENTATION ? ?Patient name: Kathleen [name]  ?DOB: 03/29/1961 ?MRN: [id number] ? ?Reason for call: opioid refill  ? ?Kathleen [name] is a 49 yof with sialoadenitis. She had called the urgent care line on 4/16 requesting a refill on oxycodone, which had been previously filled by her ENT at [facility] for the above condition. I agreed to send in a short course oxycodone '5mg'$ , #20, and notified her that she would require an office visit prior to further refills through our clinic, as she had not been seen since January.  ? ?Refill was requested again on 4/19 and declined by her PCP due to recent refill, requesting refills from multiple providers, and not yet being seen in our office.  ? ?She called our urgent care line again on 4/23, requesting opioid refill, and spoke with my colleague Dr. Lisabeth Devoid. Dr. Lisabeth Devoid reiterated that pt needed an office visit prior to it being refilled. After this was said, pt hung up.  ? ?She called again today, requesting a refill. Pt notes that she underwent surgery on 4/26 and is out of pain meds. I explained that narcotics will not be filled through our office until she has an appointment. She states she was never told this. I confronted her on this, explaining I was confident that she had because I was the provider she spoke to on 4/16. She became upset, stating "You know, ya'll are f**king full of shit" and subsequently hung up. ? ?Assessment/Plan  ? ?No refill was provided today or should be in future through our office. Would consider her inability to follow recommendations, including adherence to follow up appointments, and her inappropriate behavior on 2 separate occassions red flags. Will kindly ask our office manager/clinic director to mail her an outline of expectations, including behavioral, that she will need to follow if she wishes to continue to follow at [facility]. ? ?As always, patients are encouraged to seek further evaluation in the ED or urgent care  if symptoms worsen prior to being seen in the clinic. Patients are encouraged to use this line for urgent needs only and advice does not replace a formal evaluation by a medical provider. If symptoms are considered life threatening, they are encouraged to call 911. For all other non-urgent needs, they are encouraged to call between regular clinic hours. ? ?Mitzi Hansen, MD ?Internal Medicine Resident PGY-3 ?[facility] ?04/11/2022 1:41 PM  ?   ?

## 2022-04-16 ENCOUNTER — Other Ambulatory Visit (HOSPITAL_COMMUNITY): Payer: Self-pay

## 2022-04-16 NOTE — Telephone Encounter (Signed)
Spoke with patient to check on Epipen status. She states she did pick up Epipen last month and then used it. I inquired why she is using Epipen so frequently because it should be used for anaphylactic reactions and not asthma exacerbations. She states that she breaks out in the sun and uses Epipen. She states it minimally helps. I advised that she should take diphenhydramine or hydrocortisone if she is having hives. She states diphenhydramine makes her sleepy. I did review that there is an OTC Benadryl cream available that she should consider using. ? ?I urged her to call her pharmacy to see if Epipen can be filled. I reiterated that we are not able to start her on Xolair without her having Epipen on hand in clinic and at home especially if she keeps using it for inappropriate reasons. She will plan to call pharmacy and get back to me if they are able to fill. If so, she is aware, she can get started on Xolair as soon as next week ? ?Knox Saliva, PharmD, MPH, BCPS, CPP ?Clinical Pharmacist (Rheumatology and Pulmonology) ?

## 2022-04-22 ENCOUNTER — Emergency Department (HOSPITAL_COMMUNITY): Payer: Medicaid Other

## 2022-04-22 ENCOUNTER — Emergency Department (HOSPITAL_COMMUNITY)
Admission: EM | Admit: 2022-04-22 | Discharge: 2022-04-22 | Disposition: A | Discharge status: Home / Self Care | Payer: Medicaid Other | Attending: Emergency Medicine | Admitting: Emergency Medicine

## 2022-04-22 ENCOUNTER — Encounter (HOSPITAL_COMMUNITY): Payer: Self-pay

## 2022-04-22 ENCOUNTER — Other Ambulatory Visit: Payer: Self-pay

## 2022-04-22 DIAGNOSIS — J45901 Unspecified asthma with (acute) exacerbation: Secondary | ICD-10-CM | POA: Diagnosis not present

## 2022-04-22 DIAGNOSIS — K449 Diaphragmatic hernia without obstruction or gangrene: Secondary | ICD-10-CM | POA: Diagnosis not present

## 2022-04-22 DIAGNOSIS — F17200 Nicotine dependence, unspecified, uncomplicated: Secondary | ICD-10-CM | POA: Insufficient documentation

## 2022-04-22 DIAGNOSIS — J449 Chronic obstructive pulmonary disease, unspecified: Secondary | ICD-10-CM | POA: Insufficient documentation

## 2022-04-22 DIAGNOSIS — R0602 Shortness of breath: Secondary | ICD-10-CM

## 2022-04-22 DIAGNOSIS — Z20822 Contact with and (suspected) exposure to covid-19: Secondary | ICD-10-CM | POA: Diagnosis not present

## 2022-04-22 DIAGNOSIS — R062 Wheezing: Secondary | ICD-10-CM

## 2022-04-22 DIAGNOSIS — Z7951 Long term (current) use of inhaled steroids: Secondary | ICD-10-CM | POA: Diagnosis not present

## 2022-04-22 LAB — BASIC METABOLIC PANEL
Anion gap: 8 (ref 5–15)
BUN: 7 mg/dL — ABNORMAL LOW (ref 8–23)
CO2: 23 mmol/L (ref 22–32)
Calcium: 8.9 mg/dL (ref 8.9–10.3)
Chloride: 110 mmol/L (ref 98–111)
Creatinine, Ser: 0.96 mg/dL (ref 0.44–1.00)
GFR, Estimated: >60 mL/min (ref >60)
Glucose, Bld: 104 mg/dL — ABNORMAL HIGH (ref 70–99)
Potassium: 3.4 mmol/L — ABNORMAL LOW (ref 3.5–5.1)
Sodium: 141 mmol/L (ref 135–145)

## 2022-04-22 LAB — URINALYSIS, ROUTINE W REFLEX MICROSCOPIC
Bacteria, UA: NONE SEEN
Bilirubin Urine: NEGATIVE
Glucose, UA: NEGATIVE mg/dL
Hgb urine dipstick: NEGATIVE
Ketones, ur: NEGATIVE mg/dL
Nitrite: NEGATIVE
Protein, ur: NEGATIVE mg/dL
Specific Gravity, Urine: 1.005 (ref 1.005–1.030)
pH: 7 (ref 5.0–8.0)

## 2022-04-22 LAB — CBC WITH DIFFERENTIAL/PLATELET
Abs Immature Granulocytes: 0.02 10*3/uL (ref 0.00–0.07)
Basophils Absolute: 0.1 10*3/uL (ref 0.0–0.1)
Basophils Relative: 1 %
Eosinophils Absolute: 0.6 10*3/uL — ABNORMAL HIGH (ref 0.0–0.5)
Eosinophils Relative: 9 %
HCT: 43.5 % (ref 36.0–46.0)
Hemoglobin: 14.2 g/dL (ref 12.0–15.0)
Immature Granulocytes: 0 %
Lymphocytes Relative: 36 %
Lymphs Abs: 2.5 10*3/uL (ref 0.7–4.0)
MCH: 28.9 pg (ref 26.0–34.0)
MCHC: 32.6 g/dL (ref 30.0–36.0)
MCV: 88.6 fL (ref 80.0–100.0)
Monocytes Absolute: 0.7 10*3/uL (ref 0.1–1.0)
Monocytes Relative: 10 %
Neutro Abs: 3 10*3/uL (ref 1.7–7.7)
Neutrophils Relative %: 44 %
Platelets: 297 10*3/uL (ref 150–400)
RBC: 4.91 MIL/uL (ref 3.87–5.11)
RDW: 14 % (ref 11.5–15.5)
WBC: 6.9 10*3/uL (ref 4.0–10.5)
nRBC: 0 % (ref 0.0–0.2)

## 2022-04-22 LAB — LIPASE, BLOOD: Lipase: 54 U/L — ABNORMAL HIGH (ref 11–51)

## 2022-04-22 LAB — RESP PANEL BY RT-PCR (FLU A&B, COVID) ARPGX2
Influenza A by PCR: NEGATIVE
Influenza B by PCR: NEGATIVE
SARS Coronavirus 2 by RT PCR: NEGATIVE

## 2022-04-22 MED ORDER — IPRATROPIUM-ALBUTEROL 0.5-2.5 (3) MG/3ML IN SOLN
3.0000 mL | Freq: Once | RESPIRATORY_TRACT | Status: AC
Start: 2022-04-22 — End: 2022-04-22
  Administered 2022-04-22: 3 mL via RESPIRATORY_TRACT
  Filled 2022-04-22: qty 3

## 2022-04-22 MED ORDER — DICYCLOMINE HCL 10 MG/ML IM SOLN
20.0000 mg | Freq: Once | INTRAMUSCULAR | Status: AC
  Administered 2022-04-22: 20 mg via INTRAMUSCULAR
  Filled 2022-04-22: qty 2

## 2022-04-22 MED ORDER — KETOROLAC TROMETHAMINE 15 MG/ML IJ SOLN
15.0000 mg | Freq: Once | INTRAMUSCULAR | Status: AC
  Administered 2022-04-22: 15 mg via INTRAVENOUS
  Filled 2022-04-22: qty 1

## 2022-04-22 MED ORDER — ALBUTEROL SULFATE (5 MG/ML) 0.5% IN NEBU: 2.5000 mg | INHALATION_SOLUTION | Freq: Four times a day (QID) | RESPIRATORY_TRACT | 12 refills | Status: DC | PRN

## 2022-04-22 MED ORDER — DEXAMETHASONE SODIUM PHOSPHATE 10 MG/ML IJ SOLN
10.0000 mg | Freq: Once | INTRAMUSCULAR | Status: AC
  Administered 2022-04-22: 10 mg via INTRAVENOUS
  Filled 2022-04-22: qty 1

## 2022-04-22 MED ORDER — ONDANSETRON HCL 4 MG/2ML IJ SOLN
4.0000 mg | Freq: Once | INTRAMUSCULAR | Status: AC
  Administered 2022-04-22: 4 mg via INTRAVENOUS
  Filled 2022-04-22: qty 2

## 2022-04-22 MED ORDER — ALBUTEROL SULFATE (2.5 MG/3ML) 0.083% IN NEBU
2.5000 mg/h | INHALATION_SOLUTION | Freq: Once | RESPIRATORY_TRACT | Status: AC
  Administered 2022-04-22: 2.5 mg/h via RESPIRATORY_TRACT
  Filled 2022-04-22: qty 3

## 2022-04-22 MED ORDER — ALBUTEROL SULFATE HFA 108 (90 BASE) MCG/ACT IN AERS
2.0000 | INHALATION_SPRAY | Freq: Once | RESPIRATORY_TRACT | Status: AC
  Administered 2022-04-22: 2 via RESPIRATORY_TRACT
  Filled 2022-04-22: qty 6.7

## 2022-04-22 MED ORDER — PREDNISONE 10 MG PO TABS: 40.0000 mg | ORAL_TABLET | Freq: Every day | ORAL | 0 refills | Status: DC

## 2022-04-22 MED ORDER — ONDANSETRON HCL 4 MG/2ML IJ SOLN
4.0000 mg | Freq: Once | INTRAMUSCULAR | Status: AC
Start: 2022-04-22 — End: 2022-04-22
  Administered 2022-04-22: 4 mg via INTRAVENOUS
  Filled 2022-04-22: qty 2

## 2022-04-22 NOTE — Discharge Instructions (Addendum)
Take 40 mg of prednisone once a day for the next 4 days.  The pills, 10 mg tablets for you to take for of the pills in the morning.  You can take ibuprofen/Tylenol as needed for stomach aches.  Try eliminating certain foods from your diet to see if that is what is causing your pain.  Also prescribed albuterol nebulizer to refill your home meds.  You were discharged with an albuterol inhaler to take as needed.  Please visit your PCP for further asthma treatment and evaluation -especially since it seems like it is relatively uncontrolled right now.  Please do not hesitate to return to the emergency department if the worrisome signs and symptoms we discussed become apparent. ?

## 2022-04-22 NOTE — ED Provider Triage Note (Signed)
Emergency Medicine Provider Triage Evaluation Note ? ?Kathleen [name] , a 61 y.o. female  was evaluated in triage.  Pt complains of asthma exacerbation.  Patient states that symptoms began this morning.  She states that she has also run out of her albuterol inhaler.  She states that she has been nauseated as well throughout the day.  She is having ongoing wheezing and intermittent coughing.  She denies any recent fevers. ? ?Review of Systems  ?Positive: See above ?Negative:  ? ?Physical Exam  ?BP 135/76 (BP Location: Left Arm)   Pulse 98   Temp 98.7 ?F (37.1 ?C) (Oral)   Resp (!) 22   Ht '5\' 3"'$  (1.6 m)   Wt 60.3 kg   LMP 11/08/2014   SpO2 97%   BMI 23.56 kg/m?  ?Gen:   Awake, moderate distress ?Resp:  Tachypneic, diffuse wheezing and decreased air movement ?MSK:   Moves extremities without difficulty  ?Other:   ? ?Medical Decision Making  ?Medically screening exam initiated at 8:25 PM.  Appropriate orders placed.  Kathleen [name] was informed that the remainder of the evaluation will be completed by another provider, this initial triage assessment does not replace that evaluation, and the importance of remaining in the ED until their evaluation is complete. ? ? ?  ?Mickie Hillier, PA-C ?04/22/22 2026 ? ?

## 2022-04-22 NOTE — ED Provider Notes (Signed)
?[facility] DEPT ?Provider Note ? ? ?CSN: [id number] ?Arrival date & time: 04/22/22  2009 ? ?  ? ?History ? ?Chief Complaint  ?Patient presents with  ? Asthma  ? ? ?Kathleen [name] is a 61 y.o. female. ? ? ?Asthma ?Associated symptoms include abdominal pain and shortness of breath. Pertinent negatives include no chest pain and no headaches.  ?62 year old female presents emergency department with complaint of asthma exacerbation.  She states that her symptoms began since Tuesday.  She takes an at home albuterol nebulizer as well as inhaler, but she is out of both.  She also been taking Zofran as needed for nausea/vomiting.  She states that since Tuesday she has had multiple episodes of vomiting as well as multiple episodes of loose bowel.  She is complaining of right lower quadrant abdominal pain with radiation to the back.  He is also complaining of urinary frequency.  Denies chest pain, shortness of breath, hematuria, dysuria, vaginal symptoms, hematochezia or black tarry stools, fever, chills, night sweats ? ?Past medical history significant for COPD, chronic low back pain, asthma, bipolar 1, hyperlipidemia, polysubstance abuse, sleep apnea, stroke, GERD ? ?Home Medications ?Prior to Admission medications   ?Medication Sig Start Date End Date Taking? Authorizing Provider  ?albuterol (PROVENTIL) (5 MG/ML) 0.5% nebulizer solution Take 0.5 mLs (2.5 mg total) by nebulization every 6 (six) hours as needed for wheezing or shortness of breath. 04/22/22  Yes Dion Saucier A, PA  ?predniSONE (DELTASONE) 10 MG tablet Take 4 tablets (40 mg total) by mouth daily. 04/22/22  Yes Wilnette Kales, PA  ?ASPIRIN ADULT LOW STRENGTH 81 MG EC tablet TAKE 1 Tablet BY MOUTH ONCE DAILY ?Patient taking differently: Take 81 mg by mouth daily. 12/03/19   [name], MD  ?cephALEXin (KEFLEX) 500 MG capsule Take 1 capsule (500 mg total) by mouth 4 (four) times daily. 03/18/22   [name], MD  ?[name] 200-5  MCG/ACT AERO Inhale 2 puffs into the lungs in the morning and at bedtime. 03/09/22   Hunsucker, [name], MD  ?EPINEPHrine (EPIPEN 2-PAK) 0.3 mg/0.3 mL IJ SOAJ injection Inject 0.3 mg into the muscle as needed for anaphylaxis. 03/09/22   Hunsucker, [name], MD  ?fluticasone (FLONASE) 50 MCG/ACT nasal spray Place 1 spray into both nostrils daily. 03/26/21 03/26/22  [name], MD  ?lidocaine (LIDODERM) 5 % Place 1 patch onto the skin daily. Remove & Discard patch within 12 hours or as directed by MD 01/07/22   [name], MD  ?loratadine (CLARITIN) 10 MG tablet Take 1 tablet (10 mg total) by mouth daily. 03/09/22   Hunsucker, [name], MD  ?montelukast (SINGULAIR) 10 MG tablet Take 1 tablet (10 mg total) by mouth at bedtime. 03/09/22   Hunsucker, [name], MD  ?omalizumab [name]) 150 MG/ML prefilled syringe Inject 300 mg into the skin every 14 (fourteen) days. TOTAL DOSE = '375mg'$  every 14 days) 09/21/21   Hunsucker, [name], MD  ?omalizumab [name]) 75 MG/0.5ML prefilled syringe Inject 75 mg into the skin every 14 (fourteen) days. TOTAL DOSE = '375mg'$  every 14 days 09/21/21   Hunsucker, [name], MD  ?ondansetron (ZOFRAN ODT) 4 MG disintegrating tablet Take 1 tablet (4 mg total) by mouth every 8 (eight) hours as needed for nausea or vomiting. 01/09/22   [name], MD  ?ondansetron (ZOFRAN) 4 MG tablet TAKE 1 TABLET BY MOUTH ONCE DAILY AS NEEDED FOR NAUSEA FOR VOMITING 02/18/21   [name], MD  ?pantoprazole (PROTONIX) 40 MG tablet  Take 1 tablet (40 mg total) by mouth daily. 08/27/21   [name], MD  ?tiotropium (SPIRIVA HANDIHALER) 18 MCG inhalation capsule Place 1 capsule (18 mcg total) into inhaler and inhale daily. 03/09/22 04/08/22  Hunsucker, [name], MD  ?divalproex (DEPAKOTE ER) 500 MG 24 hr tablet Take 1 tablet (500 mg total) by mouth 2 (two) times daily. ?Patient not taking: Reported on 10/08/2019 08/09/19 03/23/20  [name], MD  ?ipratropium-albuterol (DUONEB) 0.5-2.5 (3) MG/3ML  SOLN Take 3 mLs by nebulization every 6 (six) hours as needed (wheezing or shortness of breath). ?Patient not taking: Reported on 03/19/2020 06/09/19 03/23/20  [name], MD  ?metoCLOPramide (REGLAN) 5 MG tablet Take 1 tablet (5 mg total) by mouth 3 (three) times daily before meals for 5 days. ?Patient not taking: Reported on 03/19/2020 07/17/19 03/23/20  [name], MD  ?SUMAtriptan (IMITREX) 25 MG tablet Take 1 tablet (25 mg total) by mouth every 2 (two) hours as needed for migraine or headache. ?Patient not taking: Reported on 10/08/2019 07/24/19 03/23/20  [name], DO  ?   ? ?Allergies    ?Bee venom, Peach [prunus persica], Topiramate, and Tramadol   ? ?Review of Systems   ?Review of Systems  ?Constitutional:  Negative for chills and fever.  ?HENT:  Negative for congestion and sore throat.   ?Respiratory:  Positive for shortness of breath and wheezing.   ?Cardiovascular:  Negative for chest pain and palpitations.  ?Gastrointestinal:  Positive for abdominal pain, diarrhea, nausea and vomiting. Negative for abdominal distention and blood in stool.  ?Genitourinary:  Positive for frequency. Negative for dysuria and vaginal pain.  ?Musculoskeletal:  Negative for arthralgias.  ?Skin:  Negative for color change, pallor, rash and wound.  ?Neurological:  Negative for seizures, weakness and headaches.  ?All other systems reviewed and are negative. ? ?Physical Exam ?Updated Vital Signs ?BP 112/70   Pulse 77   Temp 98.6 ?F (37 ?C) (Oral)   Resp 20   Ht '5\' 3"'$  (1.6 m)   Wt 60.3 kg   LMP 11/08/2014   SpO2 94%   BMI 23.56 kg/m?  ?Physical Exam ?Vitals and nursing note reviewed.  ?Constitutional:   ?   General: She is not in acute distress. ?   Appearance: Normal appearance. She is obese. She is not ill-appearing, toxic-appearing or diaphoretic.  ?HENT:  ?   Head: Normocephalic and atraumatic.  ?   Nose: Nose normal.  ?   Mouth/Throat:  ?   Mouth: Mucous membranes are moist.  ?   Pharynx: Oropharynx  is clear.  ?Eyes:  ?   General:     ?   Right eye: No discharge.     ?   Left eye: No discharge.  ?   Extraocular Movements: Extraocular movements intact.  ?   Conjunctiva/sclera: Conjunctivae normal.  ?   Pupils: Pupils are equal, round, and reactive to light.  ?Cardiovascular:  ?   Rate and Rhythm: Normal rate and regular rhythm.  ?   Pulses: Normal pulses.  ?   Heart sounds: Normal heart sounds. No murmur heard. ?Pulmonary:  ?   Effort: Pulmonary effort is normal.  ?   Breath sounds: No stridor. Wheezing present. No rales.  ?   Comments: Wheeze auscultated diffusely throughout all lung fields. ?Abdominal:  ?   General: Abdomen is flat. Bowel sounds are normal. There is no distension.  ?   Palpations: Abdomen is soft. There is no mass.  ?  Tenderness: There is abdominal tenderness. There is right CVA tenderness, guarding and rebound. There is no left CVA tenderness.  ?   Hernia: No hernia is present.  ?   Comments: Tenderness in the right lower quadrant with pain radiating to back.  No overlying skin abnormalities including erythema, ecchymosis, lesions, areas of induration/fluctuance.  ?Musculoskeletal:     ?   General: Normal range of motion.  ?   Cervical back: Normal range of motion and neck supple. No tenderness.  ?Skin: ?   General: Skin is warm and dry.  ?   Capillary Refill: Capillary refill takes less than 2 seconds.  ?Neurological:  ?   General: No focal deficit present.  ?   Mental Status: She is alert and oriented to person, place, and time.  ?Psychiatric:     ?   Mood and Affect: Mood normal.     ?   Behavior: Behavior normal. Behavior is cooperative.     ?   Thought Content: Thought content normal.     ?   Judgment: Judgment normal.  ? ? ?ED Results / Procedures / Treatments   ?Labs ?(all labs ordered are listed, but only abnormal results are displayed) ?Labs Reviewed  ?BASIC METABOLIC PANEL - Abnormal; Notable for the following components:  ?    Result Value  ? Potassium 3.4 (*)   ? Glucose, Bld  104 (*)   ? BUN 7 (*)   ? All other components within normal limits  ?CBC WITH DIFFERENTIAL/PLATELET - Abnormal; Notable for the following components:  ? Eosinophils Absolute 0.6 (*)   ? All other components wi

## 2022-04-22 NOTE — ED Triage Notes (Signed)
Patient said she is having an asthma exacerbation. Noticed it around 12pm, when she couldn't find her medications. She said she ran out. Needs inhaler. Said she is nauseous, needs zofran. Threw up 3 times today.  ?

## 2022-05-03 ENCOUNTER — Encounter: Payer: Medicaid Other | Admitting: Internal Medicine

## 2022-05-03 ENCOUNTER — Telehealth: Payer: Self-pay | Admitting: *Deleted

## 2022-05-03 NOTE — Telephone Encounter (Addendum)
Patient called in c/o [facility]. States she was seen in ED for this on 5/11. At that visit she was prescribed prednisone 40 mg x 5 days. Patient was not aware of this Rx so she never picked it up. Patient able to speak in full sentences w/o sounding SHOB during conversation. Scheduled for today at 1:45.

## 2022-05-03 NOTE — Telephone Encounter (Signed)
Received call from Gildardo Cranker at [place]. States ED Provider sent in Rx for albuterol 0.5% nebulizer solution which is not available. Requesting new Rx for 0.83%. Explained patient has appt today at 1345 and new Rx can be sent at that time. Requested she hold Rx for prednisone till after today's appt as well.

## 2022-05-22 ENCOUNTER — Emergency Department (HOSPITAL_COMMUNITY)
Admission: EM | Admit: 2022-05-22 | Discharge: 2022-05-22 | Discharge status: Against Medical Advice | Payer: Medicaid Other | Attending: Emergency Medicine | Admitting: Emergency Medicine

## 2022-05-22 ENCOUNTER — Other Ambulatory Visit: Payer: Self-pay

## 2022-05-22 ENCOUNTER — Emergency Department (HOSPITAL_COMMUNITY): Payer: Medicaid Other

## 2022-05-22 ENCOUNTER — Encounter (HOSPITAL_COMMUNITY): Payer: Self-pay

## 2022-05-22 DIAGNOSIS — S40862A Insect bite (nonvenomous) of left upper arm, initial encounter: Secondary | ICD-10-CM | POA: Insufficient documentation

## 2022-05-22 DIAGNOSIS — Y9389 Activity, other specified: Secondary | ICD-10-CM | POA: Diagnosis not present

## 2022-05-22 DIAGNOSIS — W57XXXA Bitten or stung by nonvenomous insect and other nonvenomous arthropods, initial encounter: Secondary | ICD-10-CM | POA: Insufficient documentation

## 2022-05-22 DIAGNOSIS — R0602 Shortness of breath: Secondary | ICD-10-CM | POA: Insufficient documentation

## 2022-05-22 DIAGNOSIS — Y999 Unspecified external cause status: Secondary | ICD-10-CM | POA: Diagnosis not present

## 2022-05-22 DIAGNOSIS — R0789 Other chest pain: Secondary | ICD-10-CM | POA: Diagnosis not present

## 2022-05-22 DIAGNOSIS — Z5321 Procedure and treatment not carried out due to patient leaving prior to being seen by health care provider: Secondary | ICD-10-CM | POA: Insufficient documentation

## 2022-05-22 DIAGNOSIS — Y92481 Parking lot as the place of occurrence of the external cause: Secondary | ICD-10-CM | POA: Insufficient documentation

## 2022-05-22 DIAGNOSIS — J45909 Unspecified asthma, uncomplicated: Secondary | ICD-10-CM | POA: Diagnosis not present

## 2022-05-22 LAB — BRAIN NATRIURETIC PEPTIDE: B Natriuretic Peptide: 47.7 pg/mL (ref 0.0–100.0)

## 2022-05-22 LAB — BASIC METABOLIC PANEL
Anion gap: 9 (ref 5–15)
BUN: 12 mg/dL (ref 8–23)
CO2: 24 mmol/L (ref 22–32)
Calcium: 9.2 mg/dL (ref 8.9–10.3)
Chloride: 106 mmol/L (ref 98–111)
Creatinine, Ser: 1.01 mg/dL — ABNORMAL HIGH (ref 0.44–1.00)
GFR, Estimated: >60 mL/min (ref >60)
Glucose, Bld: 82 mg/dL (ref 70–99)
Potassium: 3.9 mmol/L (ref 3.5–5.1)
Sodium: 139 mmol/L (ref 135–145)

## 2022-05-22 MED ORDER — IPRATROPIUM-ALBUTEROL 0.5-2.5 (3) MG/3ML IN SOLN
3.0000 mL | Freq: Once | RESPIRATORY_TRACT | Status: DC
Start: 2022-05-22 — End: 2022-05-22

## 2022-05-22 NOTE — ED Triage Notes (Signed)
Pt states she was exiting her car when an unknown insect bit her on the L arm. Pt then became SOB, states she thinks she has had an asthma attack. Pt with rash on L arm that she says is itching.

## 2022-05-22 NOTE — ED Provider Triage Note (Signed)
Emergency Medicine Provider Triage Evaluation Note  Kathleen [name] , a 61 y.o. female  was evaluated in triage.  Pt complains of shortness of breath onset prior to arrival.  She notes that she was getting out of the car to visit her sister in the emergency department when something bit her and she began to feel short of breath with chest tightness.  Has a past medical history of asthma.  No meds tried prior to arrival to the ED.  Denies chest pain, trouble swallowing.  Review of Systems  Positive: As per HPI above Negative:   Physical Exam  BP (!) 122/95 (BP Location: Left Arm)   Pulse 98   Temp 98.3 F (36.8 C) (Oral)   Resp 18   LMP 11/08/2014   SpO2 98%  Gen:   Awake, no distress   Resp:  Normal effort, wheezing noted throughout all lung fields. MSK:   Moves extremities without difficulty  Other:  Erythema noted to left arm without visible area of insect bite.  Medical Decision Making  Medically screening exam initiated at 3:29 PM.  Appropriate orders placed.  Kathleen [name] was informed that the remainder of the evaluation will be completed by another provider, this initial triage assessment does not replace that evaluation, and the importance of remaining in the ED until their evaluation is complete.  Work-up initiated   Kathleen [name] A, PA-C 05/22/22 1531

## 2022-06-09 ENCOUNTER — Ambulatory Visit (INDEPENDENT_AMBULATORY_CARE_PROVIDER_SITE_OTHER): Payer: Medicaid Other | Admitting: Pulmonary Disease

## 2022-06-09 ENCOUNTER — Encounter: Payer: Self-pay | Admitting: Pulmonary Disease

## 2022-06-09 VITALS — BP 108/74 | HR 93 | Temp 99.7°F | Ht 63.0 in | Wt 140.6 lb

## 2022-06-09 DIAGNOSIS — J449 Chronic obstructive pulmonary disease, unspecified: Secondary | ICD-10-CM | POA: Diagnosis not present

## 2022-06-09 DIAGNOSIS — J4489 Other specified chronic obstructive pulmonary disease: Secondary | ICD-10-CM

## 2022-06-09 MED ORDER — TRELEGY ELLIPTA 200-62.5-25 MCG/ACT IN AEPB: 1.0000 | INHALATION_SPRAY | Freq: Every day | RESPIRATORY_TRACT | 0 refills | Status: DC

## 2022-06-09 MED ORDER — ALBUTEROL SULFATE HFA 108 (90 BASE) MCG/ACT IN AERS: 2.0000 | INHALATION_SPRAY | Freq: Four times a day (QID) | RESPIRATORY_TRACT | 2 refills | Status: DC | PRN

## 2022-06-09 NOTE — Patient Instructions (Signed)
Remaining appointment on July 10 at 1:20 PM with our pharmacist to start Xolair injection.  Please bring your EpiPen.  We provided a 4-week supply of Trelegy, 1 puff once a day.  This replaces Dulera and Spiriva.  Once the samples run out please return to using Dulera 2 puffs twice a day and Spiriva 2 puffs once daily.  I sent a refill for albuterol rescue inhaler, 2 puffs every 4-6 hours as needed for wheeze or shortness of breath.  Return to clinic in 3 months or sooner as needed with Dr. Silas Flood

## 2022-06-09 NOTE — Progress Notes (Signed)
'@Patient'  ID: Kathleen [name], female    DOB: [birth year], 61 y.o.   MRN: [id number]  Chief Complaint  Patient presents with   Follow-up    Pt states she still has not gotten her voice back. Pt states she has been to the hospital several times for her breathing.    Referring provider: Riesa Pope, *  HPI:   61 y.o. whom we are seeing in follow-up of dyspnea exertion felt to be related to poorly controlled asthma.  ED notes x2 reviewed.    At last visit re-arranged xolair to start after months of delays. This has yet to start. Turns out she uses EpiPen frequently for hives. As such, she has exhausted her EpiPen benefit via Medicaid for the year. Since she does not have an Epi pen, xolair can not be started. On triple inhaled therapy with Dulera and Spiriva.  Seems he is not using as prescribed.  Using [facility] more as a rescue.  Trying to conserve.  Using more than should be.  Seems like she is out of albuterol rescue inhaler.  HPI initial visit: Patient has longstanding history of dyspnea exertion, wheeze, chest tightness.  Worse with temperature changes, hot and cold.  Unclear if seasonal changes,, allergies, pollens contribute.  She seemed to indicate breathing does tend to get worse when her allergy symptoms flare.  She has significant atopic symptoms including anaphylaxis to bee stings.  Breaks out in with the sound like hives or eczema to multiple different triggers.  No time during day when her symptoms dyspnea is better or worse.  Uses Dulera which seems to be the most effective inhaled agent for her symptoms.  In addition she takes [place] daily, Qvar, Symbicort, Anoro and uses as needed albuterol.  Mild cough.  No environmental changes that she can identify the triggers breathing elsewise.  No other alleviating or exacerbating factors.  3 most recent chest x-rays personally reviewed and all interpreted as clear lungs, no effusion, pneumothorax, infiltrate.  Overall look the  same.  PMH: Asthma, hyperlipidemia, seasonal allergies, headaches, sleep apnea Surgical history: Reviewed with patient, denies any Family history: First relatives with emphysema, seasonal allergies, asthma, cancer Social history: Lives in [place], former cigarette smoking, approximately 40-pack-year history, used to smoke marijuana but quit several months ago  Licensed conveyancer / Pulmonary Flowsheets:   ACT:      No data to display          MMRC: mMRC Dyspnea Scale mMRC Score  08/27/2021 10:39 AM 1    [place]worth:      No data to display          Tests:   FENO:  No results found for: "NITRICOXIDE"  PFT:    Latest Ref Rng & Units 07/17/2021    1:01 PM  PFT Results  FVC-Pre L 2.32   FVC-Predicted Pre % 95   FVC-Post L 2.61   FVC-Predicted Post % 107   Pre FEV1/FVC % % 70   Post FEV1/FCV % % 70   FEV1-Pre L 1.62   FEV1-Predicted Pre % 84   FEV1-Post L 1.83   DLCO uncorrected ml/min/mmHg 15.06   DLCO UNC% % 79   DLCO corrected ml/min/mmHg 14.44   DLCO COR %Predicted % 76   DLVA Predicted % 74   TLC L 5.45   TLC % Predicted % 114   RV % Predicted % 130   Personally reviewed interpreted as normal spirometry, significant bronchodilator response in both FEV1 and FVC, TLC within  the limits, gas trapping given RV to TLC ratio is elevated, normal DLCO.  Consistent with asthma.  WALK:      No data to display          Imaging: Personally reviewed and as per EMR discussion this note   Lab Results: Personally reviewed, IgE 2606 June 2022, eosinophils 600 June 2022 CBC    Component Value Date/Time   WBC 6.9 04/22/2022 2031   RBC 4.91 04/22/2022 2031   HGB 14.2 04/22/2022 2031   HGB 13.6 08/09/2019 1538   HCT 43.5 04/22/2022 2031   HCT 41.5 08/09/2019 1538   PLT 297 04/22/2022 2031   PLT 355 08/09/2019 1538   MCV 88.6 04/22/2022 2031   MCV 86 08/09/2019 1538   MCH 28.9 04/22/2022 2031   MCHC 32.6 04/22/2022 2031   RDW 14.0 04/22/2022 2031   RDW 12.5  08/09/2019 1538   LYMPHSABS 2.5 04/22/2022 2031   LYMPHSABS 2.9 08/09/2019 1538   MONOABS 0.7 04/22/2022 2031   EOSABS 0.6 (H) 04/22/2022 2031   EOSABS 0.2 08/09/2019 1538   BASOSABS 0.1 04/22/2022 2031   BASOSABS 0.1 08/09/2019 1538    BMET    Component Value Date/Time   NA 139 05/22/2022 1544   NA 142 10/21/2021 0945   K 3.9 05/22/2022 1544   CL 106 05/22/2022 1544   CO2 24 05/22/2022 1544   GLUCOSE 82 05/22/2022 1544   BUN 12 05/22/2022 1544   BUN 11 10/21/2021 0945   CREATININE 1.01 (H) 05/22/2022 1544   CREATININE 1.01 05/24/2014 1140   CALCIUM 9.2 05/22/2022 1544   GFRNONAA >60 05/22/2022 1544   GFRNONAA 64 05/24/2014 1140   GFRAA >60 08/14/2020 1102   GFRAA 73 05/24/2014 1140    BNP    Component Value Date/Time   BNP 47.7 05/22/2022 1544    ProBNP    Component Value Date/Time   PROBNP 69.0 04/29/2008 1933    Specialty Problems       Pulmonary Problems   Asthma-COPD overlap syndrome (HCC)    Allergies  Allergen Reactions   Bee Venom    Peach [Prunus Persica] Other (See Comments)    Throat swells , breakout   Topiramate Other (See Comments)    Reaction:  Hallucinations    Tramadol Nausea Only    Immunization History  Administered Date(s) Administered   Influenza Whole 10/08/2008, 11/25/2009   PFIZER(Purple Top)SARS-COV-2 Vaccination 03/20/2020, 04/10/2020   Pneumococcal Polysaccharide-23 11/25/2009   Td 11/25/2009   Tdap 07/08/2016    Past Medical History:  Diagnosis Date   Anxiety    Arthritis    hands   Asthma    2 sets of PFT's in 04/09 without sign variability. Last set with significant decrease in FEV1 with saline alone, suggesting Asthma but  recommended clinical corelation    Asthma    Bipolar 1 disorder [facility])    therapist is Joy and is followed by [facility]   Blackout    negative work up including ESR, ANA, opthalmology referral, carotid dopplers, 2D echo, MRI and EEG.   BREAST LUMP 03/25/2008   Annotation:  bilaterally Qualifier: Diagnosis of  By: Tomasa Hosteller MD, Edmon Crape.    Breast mass in female    s/p mammogram, u/s and biopsy in 05/09 c/w fibroadenoma,   Bronchitis    Chronic headache    Chronic low back pain 08/08/2008   Qualifier: Diagnosis of  By: Redmond Pulling  MD, Valerie     Chronic pain    normal  work up including TSH, RPR, B12, HIV, plain films, 2 ESR's, ANA, CK, RF along with routine CBC, CMET and UA. Further work up includes CRP, ESR, SPEP/UPEP, hepatitis erology, A1C , repeat ANA   COPD (chronic obstructive pulmonary disease) ([place])    COPD exacerbation ([place]) 03/31/2019   Depression    DUB (dysfunctional uterine bleeding)    and pelvic pain, with negative endometrial bx in 07/09 and transvaginal U/S significant for mild fibroids in 0/09.   Emphysema of lung ([place])    GERD (gastroesophageal reflux disease)    Hallucinations 03/18/2008   Qualifier: Diagnosis of  By: Redmond Pulling  MD, Mateo Flow     Hyperlipidemia    Lower extremity edema    Neg ABI's, normal 2D echo, normal albumin   Menorrhagia    Ovarian cyst    Polysubstance abuse ([place])    none since March 17,2009.   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   Tubular adenoma of colon     Tobacco History: Social History   Tobacco Use  Smoking Status Some Days   Packs/day: 0.25   Years: 30.00   Total pack years: 7.50   Types: Cigarettes   Last attempt to quit: 12/28/2016   Years since quitting: 5.4  Smokeless Tobacco Never  Tobacco Comments   Pt states she takes one puff every once in a while   Ready to quit: Not Answered Counseling given: Not Answered Tobacco comments: Pt states she takes one puff every once in a while   Continue to not smoke  Outpatient Encounter Medications as of 06/09/2022  Medication Sig   albuterol (PROVENTIL) (5 MG/ML) 0.5% nebulizer solution Take 0.5 mLs (2.5 mg total) by nebulization every 6 (six) hours as needed for wheezing or shortness  of breath.   albuterol (VENTOLIN HFA) 108 (90 Base) MCG/ACT inhaler Inhale 2 puffs into the lungs every 6 (six) hours as needed for wheezing or shortness of breath.   ASPIRIN ADULT LOW STRENGTH 81 MG EC tablet TAKE 1 Tablet BY MOUTH ONCE DAILY (Patient taking differently: Take 81 mg by mouth daily.)   cephALEXin (KEFLEX) 500 MG capsule Take 1 capsule (500 mg total) by mouth 4 (four) times daily.   DULERA 200-5 MCG/ACT AERO Inhale 2 puffs into the lungs in the morning and at bedtime.   EPINEPHrine (EPIPEN 2-PAK) 0.3 mg/0.3 mL IJ SOAJ injection Inject 0.3 mg into the muscle as needed for anaphylaxis.   Fluticasone-Umeclidin-Vilant (TRELEGY ELLIPTA) 200-62.5-25 MCG/ACT AEPB Inhale 1 puff into the lungs daily.   lidocaine (LIDODERM) 5 % Place 1 patch onto the skin daily. Remove & Discard patch within 12 hours or as directed by MD   loratadine (CLARITIN) 10 MG tablet Take 1 tablet (10 mg total) by mouth daily.   montelukast (SINGULAIR) 10 MG tablet Take 1 tablet (10 mg total) by mouth at bedtime.   omalizumab [name]) 150 MG/ML prefilled syringe Inject 300 mg into the skin every 14 (fourteen) days. TOTAL DOSE = 340m every 14 days)   omalizumab ([name] 75 MG/0.5ML prefilled syringe Inject 75 mg into the skin every 14 (fourteen) days. TOTAL DOSE = 3719mevery 14 days   ondansetron (ZOFRAN ODT) 4 MG disintegrating tablet Take 1 tablet (4 mg total) by mouth every 8 (eight) hours as needed for nausea or vomiting.   ondansetron (ZOFRAN) 4 MG tablet TAKE 1 TABLET BY MOUTH ONCE DAILY AS NEEDED FOR  NAUSEA FOR VOMITING   pantoprazole (PROTONIX) 40 MG tablet Take 1 tablet (40 mg total) by mouth daily.   predniSONE (DELTASONE) 10 MG tablet Take 4 tablets (40 mg total) by mouth daily.   fluticasone (FLONASE) 50 MCG/ACT nasal spray Place 1 spray into both nostrils daily.   tiotropium (SPIRIVA HANDIHALER) 18 MCG inhalation capsule Place 1 capsule (18 mcg total) into inhaler and inhale daily.   [DISCONTINUED]  divalproex (DEPAKOTE ER) 500 MG 24 hr tablet Take 1 tablet (500 mg total) by mouth 2 (two) times daily. (Patient not taking: Reported on 10/08/2019)   [DISCONTINUED] ipratropium-albuterol (DUONEB) 0.5-2.5 (3) MG/3ML SOLN Take 3 mLs by nebulization every 6 (six) hours as needed (wheezing or shortness of breath). (Patient not taking: Reported on 03/19/2020)   [DISCONTINUED] metoCLOPramide (REGLAN) 5 MG tablet Take 1 tablet (5 mg total) by mouth 3 (three) times daily before meals for 5 days. (Patient not taking: Reported on 03/19/2020)   [DISCONTINUED] SUMAtriptan (IMITREX) 25 MG tablet Take 1 tablet (25 mg total) by mouth every 2 (two) hours as needed for migraine or headache. (Patient not taking: Reported on 10/08/2019)   No facility-administered encounter medications on file as of 06/09/2022.     Review of Systems  Review of Systems  N/a Physical Exam  BP 108/74 (BP Location: Right Arm, Patient Position: Sitting, Cuff Size: Normal)   Pulse 93   Temp 99.7 F (37.6 C) (Oral)   Ht '5\' 3"'  (1.6 m)   Wt 140 lb 9.6 oz (63.8 kg)   LMP 11/08/2014   SpO2 97%   BMI 24.91 kg/m   Wt Readings from Last 5 Encounters:  06/09/22 140 lb 9.6 oz (63.8 kg)  04/22/22 133 lb (60.3 kg)  03/18/22 141 lb (64 kg)  03/09/22 155 lb (70.3 kg)  01/07/22 158 lb (71.7 kg)    BMI Readings from Last 5 Encounters:  06/09/22 24.91 kg/m  04/22/22 23.56 kg/m  03/18/22 24.98 kg/m  03/09/22 27.46 kg/m  01/07/22 27.99 kg/m     Physical Exam General: Well-appearing, in no acute distress Eyes: EOMI, no icterus Neck: Supple, no JVP Cardiovascular: Regular rhythm, no murmur Pulmonary: Wheeze in the bases bilaterally, normal work of breathing Abdomen: Nondistended, bowel sounds present MSK: No synovitis, no joint effusion Neuro: Normal gait, no weakness, hoarse voice Psych: Normal mood, full affect   Assessment & Plan:   Dyspnea on exertion: Suspect related to COPD and asthma overlap.  Highest concern for  uncontrolled asthma given her significant atopic and allergic symptoms.  PFTs consistent with asthma.  Overall symptoms stable, may be slightly better with inhaled therapies as below.  Intermittent worsening symptoms here and there.  Difficult to control on inhalers alone.  Needs Biologics as below.  COPD/asthma overlap: FEV1/FVC ratio borderline at 70% in 2009.  Similar recently 07/2021 with significant bronchodilator response.  Poorly controlled symptoms historically.  Better more recently, over the last few months.  Continue Dulera and Spiriva as prescribed for triple inhaled therapy.  Continue ProAir as needed.  Eosinophils chronically elevated most recently 600.  IgE 2600.  Given elevation of IgE out of portion eosinophils as well as significant histaminergic symptoms such as hives etc., recommend starting Xolair.  EpiPen access issues have been resolved per her report, she has an EpiPen.  Schedule appointment with pharmacy 06/21/2022 to start Xolair injections.  Esophageal stasis/dysmotility: At risk for reflux, microaspiration which can contribute to poorly controlled asthma.   Return in about 3 months (around 09/09/2022).   Rodman Key  Billey Co, MD 06/09/2022

## 2022-06-21 ENCOUNTER — Other Ambulatory Visit: Payer: Medicaid Other | Admitting: Pharmacist

## 2022-06-28 ENCOUNTER — Ambulatory Visit: Payer: Medicaid Other | Admitting: Pharmacist

## 2022-06-28 DIAGNOSIS — J4540 Moderate persistent asthma, uncomplicated: Secondary | ICD-10-CM

## 2022-06-28 DIAGNOSIS — J4489 Other specified chronic obstructive pulmonary disease: Secondary | ICD-10-CM

## 2022-06-28 DIAGNOSIS — J449 Chronic obstructive pulmonary disease, unspecified: Secondary | ICD-10-CM

## 2022-06-28 DIAGNOSIS — Z7189 Other specified counseling: Secondary | ICD-10-CM

## 2022-06-28 NOTE — Progress Notes (Signed)
HPI Patient presents today to [facility] to see pharmacy team for Xolair new start.  Past medical history includes asthma-COPD overlap, GERD, submandibular sialodocholithiasis (ENT performed procedure - she states they pulled out a "stone"), GAD.  She reports that she is taking albuterol 3 times a day. Reports that she is still epinephrine for throat swelling with asthma exacerbation. I reviewed that Epipen should be used for anaphylaxis only - she states she was stung by bee 4 days ago.  Epipen on hand and in date: Yes  Respiratory Medications Current regimen: Trelegy 200/62.5/25mcg (1 puff once daily), montelukast '10mg'$  nightly, loratadine '10mg'$  daily Patient reports no known adherence challenges  OBJECTIVE Allergies  Allergen Reactions   Bee Venom    Peach [Prunus Persica] Other (See Comments)    Throat swells , breakout   Topiramate Other (See Comments)    Reaction:  Hallucinations    Tramadol Nausea Only    Outpatient Encounter Medications as of 06/28/2022  Medication Sig   albuterol (PROVENTIL) (5 MG/ML) 0.5% nebulizer solution Take 0.5 mLs (2.5 mg total) by nebulization every 6 (six) hours as needed for wheezing or shortness of breath.   albuterol (VENTOLIN HFA) 108 (90 Base) MCG/ACT inhaler Inhale 2 puffs into the lungs every 6 (six) hours as needed for wheezing or shortness of breath.   ASPIRIN ADULT LOW STRENGTH 81 MG EC tablet TAKE 1 Tablet BY MOUTH ONCE DAILY (Patient taking differently: Take 81 mg by mouth daily.)   cephALEXin (KEFLEX) 500 MG capsule Take 1 capsule (500 mg total) by mouth 4 (four) times daily.   DULERA 200-5 MCG/ACT AERO Inhale 2 puffs into the lungs in the morning and at bedtime.   EPINEPHrine (EPIPEN 2-PAK) 0.3 mg/0.3 mL IJ SOAJ injection Inject 0.3 mg into the muscle as needed for anaphylaxis.   fluticasone (FLONASE) 50 MCG/ACT nasal spray Place 1 spray into both nostrils daily.   Fluticasone-Umeclidin-Vilant (TRELEGY ELLIPTA) 200-62.5-25  MCG/ACT AEPB Inhale 1 puff into the lungs daily.   lidocaine (LIDODERM) 5 % Place 1 patch onto the skin daily. Remove & Discard patch within 12 hours or as directed by MD   loratadine (CLARITIN) 10 MG tablet Take 1 tablet (10 mg total) by mouth daily.   montelukast (SINGULAIR) 10 MG tablet Take 1 tablet (10 mg total) by mouth at bedtime.   omalizumab [name]) 150 MG/ML prefilled syringe Inject 300 mg into the skin every 14 (fourteen) days. TOTAL DOSE = '375mg'$  every 14 days)   omalizumab [name]) 75 MG/0.5ML prefilled syringe Inject 75 mg into the skin every 14 (fourteen) days. TOTAL DOSE = '375mg'$  every 14 days   ondansetron (ZOFRAN ODT) 4 MG disintegrating tablet Take 1 tablet (4 mg total) by mouth every 8 (eight) hours as needed for nausea or vomiting.   ondansetron (ZOFRAN) 4 MG tablet TAKE 1 TABLET BY MOUTH ONCE DAILY AS NEEDED FOR NAUSEA FOR VOMITING   pantoprazole (PROTONIX) 40 MG tablet Take 1 tablet (40 mg total) by mouth daily.   predniSONE (DELTASONE) 10 MG tablet Take 4 tablets (40 mg total) by mouth daily.   tiotropium (SPIRIVA HANDIHALER) 18 MCG inhalation capsule Place 1 capsule (18 mcg total) into inhaler and inhale daily.   [DISCONTINUED] divalproex (DEPAKOTE ER) 500 MG 24 hr tablet Take 1 tablet (500 mg total) by mouth 2 (two) times daily. (Patient not taking: Reported on 10/08/2019)   [DISCONTINUED] ipratropium-albuterol (DUONEB) 0.5-2.5 (3) MG/3ML SOLN Take 3 mLs by nebulization every 6 (six) hours as needed (wheezing or  shortness of breath). (Patient not taking: Reported on 03/19/2020)   [DISCONTINUED] metoCLOPramide (REGLAN) 5 MG tablet Take 1 tablet (5 mg total) by mouth 3 (three) times daily before meals for 5 days. (Patient not taking: Reported on 03/19/2020)   [DISCONTINUED] SUMAtriptan (IMITREX) 25 MG tablet Take 1 tablet (25 mg total) by mouth every 2 (two) hours as needed for migraine or headache. (Patient not taking: Reported on 10/08/2019)   No facility-administered encounter  medications on file as of 06/28/2022.     Immunization History  Administered Date(s) Administered   Influenza Whole 10/08/2008, 11/25/2009   PFIZER(Purple Top)SARS-COV-2 Vaccination 03/20/2020, 04/10/2020   Pneumococcal Polysaccharide-23 11/25/2009   Td 11/25/2009   Tdap 07/08/2016     PFTs    Latest Ref Rng & Units 07/17/2021    1:01 PM  PFT Results  FVC-Pre L 2.32   FVC-Predicted Pre % 95   FVC-Post L 2.61   FVC-Predicted Post % 107   Pre FEV1/FVC % % 70   Post FEV1/FCV % % 70   FEV1-Pre L 1.62   FEV1-Predicted Pre % 84   FEV1-Post L 1.83   DLCO uncorrected ml/min/mmHg 15.06   DLCO UNC% % 79   DLCO corrected ml/min/mmHg 14.44   DLCO COR %Predicted % 76   DLVA Predicted % 74   TLC L 5.45   TLC % Predicted % 114   RV % Predicted % 130      Eosinophils Most recent blood eosinophil count was 600 on 04/22/22, 600 cells/microL taken on 05/20/21  IgE: 2682 on 05/20/21 Pre-treatment weight:    Assessment   Biologics training for omalizumab (Xolair)  Goals of therapy: Mechanism: IgG monoclonal antibody (recombinant DNA derived) which inhibits IgE binding to the high-affinity IgE receptor on mast cells and basophils.  Reviewed that Xolair is add-on medication and patient must continue maintenance inhaler regimen. Response to therapy: may take 3 to 6 months to determine efficacy. Discussed that patients generally feel improvement sooner than 3 months.  Side effects: anaphylaxis (0.1%) (black boxed warning), injection site reaction (45%), arthralgia (2.9% to 8%), headache (3% to 15%)  Dose: '375mg'$  every 14 days based on pre-treatment IgE of 2682 and weight of 71kg. Patient advised that each dose will be 2 purple and 1 blue pre-filled syringes.  Administration/Storage:  Reviewed administration sites of thigh or abdomen (at least 2-3 inches away from abdomen). Reviewed the upper arm is only appropriate if caregiver is administering injection. Patient advised of injection site  reaction management. Discussed that injections may take 5 to 10 seconds to administer (solution is slightly viscous). Do not inject into moles, scars, bruises, tender areas, or broken skin. Do not shake pre-filled syringe as this could lead to product foaming or precipitation. Do not use if solution is discolored or contains particulate matter or if window on prefilled pen is yellow (indicates pen has been used).  Reviewed storage of medication in refrigerator. Reviewed that Xolair can be stored at room temperature in unopened carton for up to 4 hours only.  Access: Approval of Xolair through: insurance  Patient self-administered Xolair '150mg'$ /mL x 2 syringes in right abdomen and left abdomen and Xolair '75mg'$ /0.61m x 1 syringe  in right abdomen using sample Xolair '150mg'$ /mL pre-filled syringe x 2 syringes NDC: [address]-01 Lot: [id number]: 11/2022   Xolair '75mg'$ /0.549mpre-filled syringe NDC: 5030865-7846-96ot: [id number]: 09/2022  Patient monitored for 2 hoursfor adverse reaction.  Patient tolerated without issue. Provider administered first '150mg'$  PFS. Patient self-administered the other two  doses. She felt lightheadedness and nauseous after first dose and inquired if Xolair causes these side effects. Injection site checked and no bump, hives, redness noted. She reports no itchiness or irritation, No difficulty breathing.  Administered at 1400 ('300mg'$  PFS) and 1415 ('75mg'$  PFS) 1430: no issues reported by pt 1550: no issues - pt states she must leave early because of her sister. Advised her to see if one of her sons could be home. 1510: no issues  - patient confirmed that her son could go home to monitor sister. 1530: no issues reported. Patient reports that she'd like to leave.   Medication Reconciliation  A drug regimen assessment was performed, including review of allergies, interactions, disease-state management, dosing and immunization history. Medications were reviewed  with the patient, including name, instructions, indication, goals of therapy, potential side effects, importance of adherence, and safe use.  Drug interaction(s): none noted  Immunizations  Patient is indicated for the influenzae, pneumonia, and shingles vaccinations. Patient has received 2 COVID19 vaccines.   PLAN Patient did not stay for complete 2 hours of monitoring. She did leave against medical advice due to her sister being on hospice - she is aware of black box warning for anaphylaxis with Xolair including the possibility for anaphylaxis within 2 hours of first injection. She knows how to use Epipen. She has another Epipen at home. She will be going home where her son and a hospice nurse will be. Son also knows how to administer Epipen..  Second Xolair dose will be administered in clinic with 30 minute monitoring period. Scheduled for 07/12/22 @ 3:20p. Patient advised that Epipen must be on hand at each visit. Third Xolair will be administered in clinic with 30 minute monitoring period - appointment will be scheduled at second-dose visit. Rx sent to: [facility]: [phone] .  We have two additional doses that were delivered to clinic many months ago. Continue maintenance asthma regimen of: Trelegy 200/62.5/25mcg (1 puff once daily), montelukast '10mg'$  nightly, loratadine '10mg'$  daily  All questions encouraged and answered.  Instructed patient to reach out with any further questions or concerns.  Thank you for allowing pharmacy to participate in this patient's care.  This appointment required 150 minutes of patient care (this includes precharting, chart review, review of results, face-to-face care, etc.).  Knox Saliva, PharmD, MPH, BCPS, CPP Clinical Pharmacist (Rheumatology and Pulmonology)

## 2022-06-28 NOTE — Patient Instructions (Addendum)
We will complete your second Xolair dose together on 07/12/22. There will be a 30 minute monitoring period after this injection.  We will also complete your third Xolair dose together.  There will be a 30 minute monitoring period after this injection.  CONTINUE Trelegy 200/62.5/25mcg (1 puff once daily), montelukast '10mg'$  nightly, loratadine '10mg'$  daily  How to manage an injection site reaction: Remember the 5 C's: COUNTER - leave on the counter at least 30 minutes but up to overnight to bring medication to room temperature. This may help prevent stinging COLD - place something cold (like an ice gel pack or cold water bottle) on the injection site just before cleansing with alcohol. This may help reduce pain CLARITIN - use Claritin (generic name is loratadine) for the first two weeks of treatment or the day of, the day before, and the day after injecting. This will help to minimize injection site reactions CORTISONE CREAM - apply if injection site is irritated and itching CALL ME - if injection site reaction is bigger than the size of your fist, looks infected, blisters, or if you develop hives   Anaphylactic Reactions  Anaphylactic reactions can occur up to 24 hours after administration.  What are the signs and symptoms of anaphylaxis?  Symptoms can vary from a mild skin reaction to more severe reactions including: Wheezing, shortness of breath, cough, chest tightness or trouble breathing Low blood pressure, dizziness, fainting, rapid of weak heartbeat, anxiety, or feeling of "impending doom" Flushing, itching, hives, or feeling warm Swelling of the throat or tongue, throat tightness, hoarse voice, or trouble swallowing  Some of these symptoms require immediate treatment, as they can be life threatening.  If you experience severe symptoms which are bolded above use your Epipen and call 911 for immediate emergency care.

## 2022-07-07 ENCOUNTER — Ambulatory Visit (INDEPENDENT_AMBULATORY_CARE_PROVIDER_SITE_OTHER): Payer: Medicaid Other | Admitting: Student

## 2022-07-07 DIAGNOSIS — J449 Chronic obstructive pulmonary disease, unspecified: Secondary | ICD-10-CM

## 2022-07-07 DIAGNOSIS — J4489 Other specified chronic obstructive pulmonary disease: Secondary | ICD-10-CM

## 2022-07-07 DIAGNOSIS — F17210 Nicotine dependence, cigarettes, uncomplicated: Secondary | ICD-10-CM | POA: Diagnosis not present

## 2022-07-07 DIAGNOSIS — R634 Abnormal weight loss: Secondary | ICD-10-CM | POA: Diagnosis not present

## 2022-07-07 DIAGNOSIS — R0602 Shortness of breath: Secondary | ICD-10-CM

## 2022-07-07 MED ORDER — TRELEGY ELLIPTA 200-62.5-25 MCG/ACT IN AEPB: 1.0000 | INHALATION_SPRAY | Freq: Every day | RESPIRATORY_TRACT | 3 refills | Status: DC

## 2022-07-07 MED ORDER — PREDNISONE 20 MG PO TABS: 20.0000 mg | ORAL_TABLET | Freq: Every day | ORAL | 0 refills | Status: DC

## 2022-07-07 MED ORDER — IPRATROPIUM-ALBUTEROL 0.5-2.5 (3) MG/3ML IN SOLN
3.0000 mL | Freq: Once | RESPIRATORY_TRACT | Status: AC
  Administered 2022-07-07: 3 mL via RESPIRATORY_TRACT

## 2022-07-07 MED ORDER — METHYLPREDNISOLONE ACETATE 40 MG/ML IJ SUSP: 125.0000 mg | Freq: Once | INTRAMUSCULAR | Status: DC

## 2022-07-07 MED ORDER — METHYLPREDNISOLONE ACETATE 40 MG/ML IJ SUSP: 40.0000 mg | Freq: Once | INTRAMUSCULAR | Status: DC

## 2022-07-07 MED ORDER — METHYLPREDNISOLONE SODIUM SUCC 125 MG IJ SOLR
125.0000 mg | Freq: Once | INTRAMUSCULAR | Status: AC
  Administered 2022-07-07: 125 mg via INTRAMUSCULAR

## 2022-07-07 MED ORDER — ALBUTEROL SULFATE HFA 108 (90 BASE) MCG/ACT IN AERS
2.0000 | INHALATION_SPRAY | Freq: Four times a day (QID) | RESPIRATORY_TRACT | 2 refills | Status: DC | PRN
Start: 2022-07-07 — End: 2022-09-21

## 2022-07-07 NOTE — Patient Instructions (Signed)
Thank you so much for coming to the clinic today, was a pleasure meeting you!  Here is a quick summary of everything we talked about:  1.  We are refilling all of your inhalers 2.  We are also getting some blood work today 3.  Also getting some images of your chest, they should be reaching out to you shortly to get it done. 4.  The second any of these results come back I will give you a phone call. 5.  In the meantime, try to stay hydrated, and try to get yourself to eat!  If you have any questions please call the clinic at [phone]  Best, Dr. Marlene Lard Erasmus Bistline

## 2022-07-07 NOTE — Progress Notes (Signed)
CC: Weight Loss  HPI:  [name] is a 61 y.o. female living with a history stated below and presents today for weight loss. Please see problem based assessment and plan for additional details.  Past Medical History:  Diagnosis Date   Anxiety    Arthritis    hands   Asthma    2 sets of PFT's in 04/09 without sign variability. Last set with significant decrease in FEV1 with saline alone, suggesting Asthma but  recommended clinical corelation    Asthma    Bipolar 1 disorder [facility])    therapist is Joy and is followed by [facility]   Blackout    negative work up including ESR, ANA, opthalmology referral, carotid dopplers, 2D echo, MRI and EEG.   BREAST LUMP 03/25/2008   Annotation: bilaterally Qualifier: Diagnosis of  By: Tomasa Hosteller MD, Edmon Crape.    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 Further work up includes CRP, ESR, SPEP/UPEP, hepatitis erology, A1C , repeat ANA   COPD (chronic obstructive pulmonary disease) ([place])    COPD exacerbation ([place]) 03/31/2019   Depression    DUB (dysfunctional uterine bleeding)    and pelvic pain, with negative endometrial bx in 07/09 and transvaginal U/S significant for mild fibroids in 0/09.   Emphysema of lung ([place])    GERD (gastroesophageal reflux disease)    Hallucinations 03/18/2008   Qualifier: Diagnosis of  By: Redmond Pulling  MD, Mateo Flow     Hyperlipidemia    Lower extremity edema    Neg ABI's, normal 2D echo, normal albumin   Menorrhagia    Ovarian cyst    Polysubstance abuse ([place])    none since March 17,2009.   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 1 each 3   lidocaine (LIDODERM) 5 % Place 1 patch onto the skin daily. Remove & Discard patch within 12 hours or as directed by MD 30 patch 1   loratadine (CLARITIN) 10 MG tablet Take 1 tablet (10 mg total) by mouth daily. 30 tablet 11   montelukast (SINGULAIR) 10 MG tablet Take 1 tablet (10 mg total) by mouth at bedtime. 90 tablet 3   omalizumab (XOLAIR) 150 MG/ML prefilled syringe Inject 300 mg into the skin every 14 (fourteen) days. TOTAL DOSE = 363m every 14 days) 4 mL 1   omalizumab (XOLAIR) 75 MG/0.5ML prefilled syringe Inject 75 mg into the skin every 14 (fourteen) days. TOTAL DOSE = 3755mevery 14 days 1 mL 1   ondansetron (ZOFRAN) 4 MG tablet TAKE 1 TABLET BY MOUTH ONCE DAILY AS NEEDED FOR  NAUSEA FOR VOMITING 30 tablet 0   pantoprazole (PROTONIX) 40 MG tablet Take 1 tablet (40 mg total) by mouth daily. 90 tablet 1   No current facility-administered medications on file prior to visit.    Family History  Problem Relation Age of Onset   Colon cancer Mother 47   Breast cancer Mother 67   Rectal cancer Mother    Diabetes Father    Hypertension Father    Kidney disease Father    Colon cancer Father 60   Prostate cancer Father    Cancer Father    Kidney disease Sister    Cancer Brother    Breast cancer Maternal Grandmother 55   Esophageal cancer Neg Hx    Stomach cancer Neg Hx     Social History   Socioeconomic History   Marital status: Single    Spouse name: Not on file   Number of children: Not on file   Years of education: Not on file   Highest education level: Not on file  Occupational History   Not on  file  Tobacco Use   Smoking status: Some Days    Packs/day: 0.25    Years: 30.00    Total pack years: 7.50    Types: Cigarettes    Last attempt to quit: 12/28/2016    Years since quitting: 5.5   Smokeless tobacco: Never   Tobacco comments:    Pt states she takes one puff every once in a while  Vaping Use   Vaping Use: Never used  Substance and Sexual Activity   Alcohol use: Yes    Alcohol/week: 0.0 standard drinks of alcohol    Comment: rarely   Drug use: Not Currently    Types: Marijuana    Comment: Sometimes.   Sexual activity: Yes    Birth control/protection: None    Comment: monogamous  Other Topics Concern   Not on file  Social History Narrative   ** Merged History Encounter **       Lives between [place] and boyfriend's house , currently trying to abstain from illegal substances, continues to smoke and says nicotine patch made her sick.   Social Determinants of Health   Financial Resource Strain: Not on file  Food Insecurity: Not on file  Transportation Needs: Not on file  Physical Activity: Not on file  Stress: Not on file  Social Connections: Not on file  Intimate Partner Violence: Not on file    Review of Systems: ROS negative except for what is noted on the assessment and plan.  Vitals:   07/07/22 1525 07/07/22 1620  BP: 106/83 (!) 131/105  Pulse: (!) 104 89  Temp: 98 F (36.7 C)   TempSrc: Oral   SpO2: 97%   Weight: 138 lb (62.6 kg)   Height: '5\' 3"'  (1.6 m)     Physical Exam: Constitutional: anxious appearing woman, in no distress after hyperventilation episode HENT: normocephalic atraumatic, mucous membranes moist Cardiovascular: regular rate and rhythm, no m/r/g Pulmonary/Chest: normal work of breathing on room air, wheezes heard b/l throughout lung field MSK: normal bulk and tone Skin: warm and dry Psych: Panic attack during encounter  Assessment & Plan:   Asthma-COPD overlap syndrome (HCC) During mid-interview, patient started to  hyperventilate and  became short of breath. I was able to find Dr. Daryll Drown who helped her into breathing through pursed lips, while nurses administered oxygen and a shot of medrol which improved her symptoms. She was also given breathing treatment with Duoneb.  Plan: -At the time, patient was discussing her life circumstances and was opening up on personal matters, which may have led her to an anxiety-induced panic attack, however due to her COPD it led to a lack of oxygen. - I was able to counsel her on her grief, as well as refilled her inhalers - I also prescribed a 5 day course of Prednisone for patient to take  Weight loss Patient has lost close to 20 pounds since mid-March without intention. She says she just doesn't feel hungry anymore. None of her medications have side effects of suppression of appetite or weight loss. She has smoked cigarettes since she was 24, and recently quit. She also has not had a mammogram since 2021, and her LMP was in 2015. She denies any vaginal bleeding, return of periods, rashes, or hemoptysis.   Plan:   At this point, I am concerned for malignancy due to her sudden weight loss and smoking history.   - I have ordered a chest CT to evaluate her lungs - Sent a referral to OB/GYN for a mammogram  - Ordered a TSH, ESR,CRP, CBC, and BMP to look for any sign of increased blood count or inflammation  Patient seen with Dr. Ronny Bacon Vincy Feliz, M.D. [facility], PGY-1 Phone: [phone] Date 07/07/2022 Time 9:40 PM

## 2022-07-07 NOTE — Assessment & Plan Note (Addendum)
During mid-interview, patient started to hyperventilate and  became short of breath. I was able to find Dr. Daryll Drown who helped her into breathing through pursed lips, while nurses administered oxygen and a shot of medrol which improved her symptoms. She was also given breathing treatment with Duoneb.  Plan: -At the time, patient was discussing her life circumstances and was opening up on personal matters, which may have led her to an anxiety-induced panic attack, however due to her COPD it led to a lack of oxygen. - I was able to counsel her on her grief, as well as refilled her inhalers - I also prescribed a 5 day course of Prednisone for patient to take

## 2022-07-07 NOTE — Assessment & Plan Note (Addendum)
Patient has lost close to 20 pounds since mid-March without intention. She says she just doesn't feel hungry anymore. None of her medications have side effects of suppression of appetite or weight loss. She has smoked cigarettes since she was 11, and recently quit. She also has not had a mammogram since 2021, and her LMP was in 2015. She denies any vaginal bleeding, return of periods, rashes, or hemoptysis.   Plan:   At this point, I am concerned for malignancy due to her sudden weight loss and smoking history.   - I have ordered a chest CT to evaluate her lungs - Sent a referral to OB/GYN for a mammogram  - Ordered a TSH, ESR,CRP, CBC, and BMP to look for any sign of increased blood count or inflammation  Addendum 07/14/22: -Ordered a mammogram directly and canceled OB referral

## 2022-07-08 ENCOUNTER — Telehealth: Payer: Self-pay | Admitting: Student

## 2022-07-08 NOTE — Telephone Encounter (Signed)
Discussed results with patient on the phone, no red flags on blood work.

## 2022-07-09 LAB — CBC WITH DIFFERENTIAL/PLATELET
Basophils Absolute: 0.1 10*3/uL (ref 0.0–0.2)
Basos: 1 %
EOS (ABSOLUTE): 0.3 10*3/uL (ref 0.0–0.4)
Eos: 4 %
Hematocrit: 46.5 % (ref 34.0–46.6)
Hemoglobin: 15.7 g/dL (ref 11.1–15.9)
Immature Grans (Abs): 0 10*3/uL (ref 0.0–0.1)
Immature Granulocytes: 0 %
Lymphocytes Absolute: 3 10*3/uL (ref 0.7–3.1)
Lymphs: 36 %
MCH: 28.3 pg (ref 26.6–33.0)
MCHC: 33.8 g/dL (ref 31.5–35.7)
MCV: 84 fL (ref 79–97)
Monocytes Absolute: 0.8 10*3/uL (ref 0.1–0.9)
Monocytes: 9 %
Neutrophils Absolute: 4.2 10*3/uL (ref 1.4–7.0)
Neutrophils: 50 %
Platelets: 364 10*3/uL (ref 150–450)
RBC: 5.55 x10E6/uL — ABNORMAL HIGH (ref 3.77–5.28)
RDW: 13.1 % (ref 11.7–15.4)
WBC: 8.4 10*3/uL (ref 3.4–10.8)

## 2022-07-09 LAB — TSH: TSH: 1.94 u[IU]/mL (ref 0.450–4.500)

## 2022-07-09 LAB — BMP8+ANION GAP
Anion Gap: 18 mmol/L (ref 10.0–18.0)
BUN/Creatinine Ratio: 11 — ABNORMAL LOW (ref 12–28)
BUN: 10 mg/dL (ref 8–27)
CO2: 22 mmol/L (ref 20–29)
Calcium: 9.4 mg/dL (ref 8.7–10.3)
Chloride: 102 mmol/L (ref 96–106)
Creatinine, Ser: 0.88 mg/dL (ref 0.57–1.00)
Glucose: 82 mg/dL (ref 70–99)
Potassium: 4.4 mmol/L (ref 3.5–5.2)
Sodium: 142 mmol/L (ref 134–144)
eGFR: 75 mL/min/{1.73_m2} (ref >59)

## 2022-07-09 LAB — SEDIMENTATION RATE: Sed Rate: 9 mm/hr (ref 0–40)

## 2022-07-09 LAB — C-REACTIVE PROTEIN: CRP: <1 mg/L (ref 0–10)

## 2022-07-12 ENCOUNTER — Other Ambulatory Visit: Payer: Medicaid Other | Admitting: Pharmacist

## 2022-07-12 NOTE — Progress Notes (Signed)
Internal Medicine Clinic Attending  Case discussed with Dr. Wyvonna Plum  at the time of the visit.  We reviewed the resident's history and exam and pertinent patient test results.  I agree with the assessment, diagnosis, and plan of care documented in the resident's note.

## 2022-07-14 NOTE — Addendum Note (Signed)
Addended byDrucie Opitz on: 07/14/2022 03:30 PM   Modules accepted: Orders

## 2022-07-21 ENCOUNTER — Telehealth: Payer: Self-pay | Admitting: *Deleted

## 2022-07-21 ENCOUNTER — Ambulatory Visit (INDEPENDENT_AMBULATORY_CARE_PROVIDER_SITE_OTHER): Payer: Medicaid Other | Admitting: Internal Medicine

## 2022-07-21 DIAGNOSIS — J4489 Other specified chronic obstructive pulmonary disease: Secondary | ICD-10-CM

## 2022-07-21 DIAGNOSIS — F17210 Nicotine dependence, cigarettes, uncomplicated: Secondary | ICD-10-CM

## 2022-07-21 DIAGNOSIS — J449 Chronic obstructive pulmonary disease, unspecified: Secondary | ICD-10-CM

## 2022-07-21 MED ORDER — PREDNISONE 20 MG PO TABS
40.0000 mg | ORAL_TABLET | Freq: Every day | ORAL | 0 refills | Status: AC
Start: 2022-07-21 — End: 2022-07-26

## 2022-07-21 NOTE — Telephone Encounter (Signed)
Call from patient having problems with her Asthma requesting an appointment or order for an Inhaler and Prednisone. Patient states she has been doing her treatments.  If symptoms worsen before Telehealth appointment pt to go to the ER.   Patient to be added on  as TeleHealth this morning.

## 2022-07-21 NOTE — Patient Instructions (Signed)
Asthma: You have refills at your pharmacy for your Trelegy and your Albuterol inhalers. I am sending in a 5 day course of prednisone, as well. If your symptoms worsen, please go to the emergency room or give [place] a call back

## 2022-07-21 NOTE — Assessment & Plan Note (Signed)
The patient presents via telehealth today for concerns of shortness of breath.  The patient has been quite upset and hyperventilating over the last 2 days, as her sister suddenly passed away 2 days ago. She has had a lot going on in her personal life and is grieving.  She has been having a cough as well, but denies any wheezing, fevers, or chills.  The patient states that her shortness of breath is worse when conversing and while exerting herself, and improves while at rest.  She is quite anxious, she is out of her inhalers (Trelegy and albuterol) and does not have any medications to help her with her symptoms.  Of note, the patient was here at the end of July and started to hyperventilate and become short of breath.  At that time, she was giving a breathing treatment with DuoNeb and a shot of Medrol.  She was also given a short 5-day course of prednisone 20 mg daily, which improved her symptoms.  Plan: - Patient has refills of her Trelegy and Albuterol- will call pharmacy to ensure patient can get these - Prednisone 40 mg daily x 5 days (I advised the patient to use her inhalers first, and if her symptoms do not improve, she can use the prednisone) - Advised patient that if her symptoms worsen that she should go to the emergency room

## 2022-07-21 NOTE — Progress Notes (Signed)
[facility] Internal Medicine Residency Telephone Encounter Continuity Care Appointment  HPI:  This telephone encounter was created for [name] on 07/21/2022 for the following purpose/cc shortness of breath.   Past Medical History:  Past Medical History:  Diagnosis Date   Anxiety    Arthritis    hands   Asthma    2 sets of PFT's in 04/09 without sign variability. Last set with significant decrease in FEV1 with saline alone, suggesting Asthma but  recommended clinical corelation    Asthma    Bipolar 1 disorder [facility])    therapist is Joy and is followed by [facility]   Blackout    negative work up including ESR, ANA, opthalmology referral, carotid dopplers, 2D echo, MRI and EEG.   BREAST LUMP 03/25/2008   Annotation: bilaterally Qualifier: Diagnosis of  By: Tomasa Hosteller MD, Edmon Crape.    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 Further work up includes CRP, ESR, SPEP/UPEP, hepatitis erology, A1C , repeat ANA   COPD (chronic obstructive pulmonary disease) ([place])    COPD exacerbation ([place]) 03/31/2019   Depression    DUB (dysfunctional uterine bleeding)    and pelvic pain, with negative endometrial bx in 07/09 and transvaginal U/S significant for mild fibroids in 0/09.   Emphysema of lung ([place])    GERD (gastroesophageal reflux disease)    Hallucinations 03/18/2008   Qualifier: Diagnosis of  By: Redmond Pulling  MD, Mateo Flow     Hyperlipidemia    Lower extremity edema    Neg ABI's, normal 2D echo, normal albumin   Menorrhagia    Ovarian cyst    Polysubstance abuse ([place])    none since March 17,2009.   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  Respiratory:  Positive for cough and shortness of breath. Negative for hemoptysis and wheezing.   Cardiovascular:  Negative for chest pain and palpitations.     Assessment / Plan / Recommendations:  Please see A&P under problem oriented charting for assessment of the patient's acute and chronic medical conditions.  As always, pt is advised that if symptoms worsen or new symptoms arise, they should go to an urgent care facility or to to ER for further evaluation.   Consent and Medical Decision Making:  Patient discussed with Dr. Dareen Piano This is a telephone encounter between Kathleen [name] and Dorethea Clan on 07/21/2022 for shortness of breath. The visit was conducted with the patient located at home and Dorethea Clan at [facility]. The patient's identity was confirmed using their DOB and current address. The patient has consented to being evaluated through a telephone encounter and understands the associated risks (an examination cannot be done and the patient may need to come in for an appointment) / benefits (allows the patient to remain at home, decreasing exposure to coronavirus). I personally spent 12 minutes on medical  discussion.    Asthma-COPD overlap syndrome ([place]) The patient presents via telehealth today for concerns of shortness of breath.  The patient has been quite upset and hyperventilating over the last 2 days, as her sister suddenly passed away 2 days ago. She has had a lot going on in her personal life and is grieving.  She has been having a cough as well, but denies any wheezing, fevers, or chills.  The patient states that her shortness of breath is worse when conversing and while exerting herself, and improves while at rest.  She is quite anxious, she is out of her inhalers (Trelegy and albuterol) and does not have any medications to help her with her  symptoms.  Of note, the patient was here at the end of July and started to hyperventilate and become short of breath.  At that time, she was giving a breathing treatment with DuoNeb and a shot of Medrol.  She was also given a short 5-day course of prednisone 20 mg daily, which improved her symptoms.  Plan: - Patient has refills of her Trelegy and Albuterol- will call pharmacy to ensure patient can get these - Prednisone 40 mg daily x 5 days (I advised the patient to use her inhalers first, and if her symptoms do not improve, she can use the prednisone) - Advised patient that if her symptoms worsen that she should go to the emergency room

## 2022-07-22 NOTE — Progress Notes (Signed)
Internal Medicine Clinic Attending ° °Case discussed with Dr. Atway  At the time of the visit.  We reviewed the resident’s history and exam and pertinent patient test results.  I agree with the assessment, diagnosis, and plan of care documented in the resident’s note.  °

## 2022-07-28 ENCOUNTER — Ambulatory Visit (INDEPENDENT_AMBULATORY_CARE_PROVIDER_SITE_OTHER): Payer: Medicaid Other | Admitting: Student

## 2022-07-28 ENCOUNTER — Other Ambulatory Visit: Payer: Self-pay

## 2022-07-28 ENCOUNTER — Telehealth: Payer: Self-pay | Admitting: *Deleted

## 2022-07-28 VITALS — BP 124/86 | HR 89 | Temp 98.4°F | Resp 28 | Ht 63.0 in | Wt 133.3 lb

## 2022-07-28 DIAGNOSIS — M533 Sacrococcygeal disorders, not elsewhere classified: Secondary | ICD-10-CM

## 2022-07-28 DIAGNOSIS — J449 Chronic obstructive pulmonary disease, unspecified: Secondary | ICD-10-CM | POA: Diagnosis present

## 2022-07-28 DIAGNOSIS — F32A Depression, unspecified: Secondary | ICD-10-CM

## 2022-07-28 DIAGNOSIS — J4551 Severe persistent asthma with (acute) exacerbation: Secondary | ICD-10-CM

## 2022-07-28 DIAGNOSIS — Z87892 Personal history of anaphylaxis: Secondary | ICD-10-CM

## 2022-07-28 DIAGNOSIS — F411 Generalized anxiety disorder: Secondary | ICD-10-CM | POA: Diagnosis not present

## 2022-07-28 DIAGNOSIS — F17210 Nicotine dependence, cigarettes, uncomplicated: Secondary | ICD-10-CM

## 2022-07-28 DIAGNOSIS — J4489 Other specified chronic obstructive pulmonary disease: Secondary | ICD-10-CM

## 2022-07-28 DIAGNOSIS — R0602 Shortness of breath: Secondary | ICD-10-CM

## 2022-07-28 MED ORDER — IPRATROPIUM-ALBUTEROL 0.5-2.5 (3) MG/3ML IN SOLN: 3.0000 mL | Freq: Four times a day (QID) | RESPIRATORY_TRACT | 2 refills | Status: DC | PRN

## 2022-07-28 MED ORDER — HYDROXYZINE HCL 25 MG PO TABS
25.0000 mg | ORAL_TABLET | Freq: Three times a day (TID) | ORAL | 2 refills | Status: DC | PRN
Start: 2022-07-28 — End: 2022-11-17

## 2022-07-28 MED ORDER — IPRATROPIUM-ALBUTEROL 0.5-2.5 (3) MG/3ML IN SOLN
3.0000 mL | Freq: Once | RESPIRATORY_TRACT | Status: AC
  Administered 2022-07-28: 3 mL via RESPIRATORY_TRACT

## 2022-07-28 MED ORDER — HYDROXYZINE HCL 25 MG PO TABS: 25.0000 mg | ORAL_TABLET | Freq: Three times a day (TID) | ORAL | 2 refills | Status: DC | PRN

## 2022-07-28 MED ORDER — PREDNISONE 20 MG PO TABS
40.0000 mg | ORAL_TABLET | Freq: Every day | ORAL | 0 refills | Status: AC
Start: 2022-07-28 — End: 2022-08-02

## 2022-07-28 MED ORDER — TRELEGY ELLIPTA 200-62.5-25 MCG/ACT IN AEPB: 1.0000 | INHALATION_SPRAY | Freq: Every day | RESPIRATORY_TRACT | 3 refills | Status: DC

## 2022-07-28 NOTE — Telephone Encounter (Signed)
Call from patient having an Asthma Attack.  Sister died recently.  Wants to get a refill on Prednisone.  Does not want to go to the ER.  Has been up all night.

## 2022-07-28 NOTE — Progress Notes (Signed)
CC: Shortness of breath, asthma exacerbation  HPI:  Kathleen [name] is a 61 y.o. female with PMH as below who presents to clinic today for evaluation of worsening anxiety and asthma attack. Please see problem based charting for evaluation, assessment and plan.  Past Medical History:  Diagnosis Date   Anxiety    Arthritis    hands   Asthma    2 sets of PFT's in 04/09 without sign variability. Last set with significant decrease in FEV1 with saline alone, suggesting Asthma but  recommended clinical corelation    Asthma    Bipolar 1 disorder [facility])    therapist is Joy and is followed by [facility]   Blackout    negative work up including ESR, ANA, opthalmology referral, carotid dopplers, 2D echo, MRI and EEG.   BREAST LUMP 03/25/2008   Annotation: bilaterally Qualifier: Diagnosis of  By: Tomasa Hosteller MD, Edmon Crape.    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 Further work up includes CRP, ESR, SPEP/UPEP, hepatitis erology, A1C , repeat ANA   COPD (chronic obstructive pulmonary disease) ([place])    COPD exacerbation ([place]) 03/31/2019   Depression    DUB (dysfunctional uterine bleeding)    and pelvic pain, with negative endometrial bx in 07/09 and transvaginal U/S significant for mild fibroids in 0/09.   Emphysema of lung ([place])    GERD (gastroesophageal reflux disease)    Hallucinations 03/18/2008   Qualifier: Diagnosis of  By: Redmond Pulling  MD, Mateo Flow     Hyperlipidemia    Lower extremity edema    Neg ABI's, normal 2D echo, normal albumin   Menorrhagia    Ovarian cyst    Polysubstance abuse ([place])    none since March 17,2009.   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 Eyes: Negative for visual changes Respiratory: Positive for shortness of breath and wheezing Cardiac: Negative for chest pain Psych: Positive for anxiety  Physical Exam: General: Pleasant, well-appearing elderly woman. In mild respiratory distress Cardiac: RRR. No murmurs, rubs or gallops. No LE edema Respiratory: Tachypneic. Increased work of breathing. Mild accessory muscle use. Moderate wheezing throughout lung sounds.  No rales or rhonchi. Abdominal: Soft, symmetric and non tender. Normal BS. Skin: Warm, dry and intact without rashes or lesions Extremities: Atraumatic. Full ROM. Palpable radial and DP pulses. Neuro: A&O x 3. Moves all extremities Psych: Anxious mood  Vitals:   07/28/22 1103  BP: 124/86  Pulse: 89  Resp: (!) 28  Temp: 98.4 F (36.9 C)  TempSrc: Oral  SpO2: 97%  Weight: 133 lb 4.8 oz (60.5 kg)  Height: _0  (1.6 m)    Assessment & Plan:   Depression Patient's PHQ-9 is 24 today in the context of patient grieving her sister's death. States they are planning her funeral later this week.  Discussed referral patient to therapy/grief counseling, however  patient states that she has too much going on and wants to focus on burying her sister first.  [facility] grief counseling information given to patient on AVS -Consider starting SSRI and referral to therapy at next office visit  Generalized anxiety disorder Patient has a history of untreated generalized anxiety.  Her older sister recently passed which is because patient to have panic attacks that have led to multiple asthma attacks.  Per brother, patient has had difficulty sleeping and will benefit from something to help with her anxiety in the short-term.  Patient agreeable to starting as needed hydroxyzine for now with plan to  reevaluate after she buries her sister.  Plan: -Start hydroxyzine 25 mg 3 times daily as needed for anxiety -GAD-7 at next office visit  Asthma exacerbation Patient with a history of asthma/COPD overlap here for acute office visit to be evaluated for possible asthma attack. Over the last 2 weeks, patient has had an episode of worsening shortness of breath requiring IV Solu-Medrol, DuoNeb and oral prednisone. These are all in the context of patient losing her sister last month. She has a history of generalized anxiety disorder that is untreated and has had frequent panic attacks/asthma attacks since her death.  States she feels overwhelmed most days.  States she has been out of her Trelegy inhaler and her nebulizer treatment does not always help. Of note, patient's Trelegy was previously given as a sample. Today, patient was found to be in mild respiratory distress with moderate wheezes so an DuoNeb treatment was given with improvement in her symptoms. She continued to have some expiratory wheezes after treatment but O2 remain stable on room air.  Discussed with patient about providing a short course of oral steroids and resuming her trelegy   Plan: -Start prednisone 40 mg daily for 5 days -Refill Trelegy inhaler 1 puff daily -Start DuoNeb nebulized treatment every 6 hours as needed for shortness of breath or wheezing -Follow-up with pulmonology -Hydroxyzine to help with anxiety -Follow-up as needed    See Encounters Tab for problem based charting.  Patient discussed with Dr.  Thomes Cake, MD, MPH

## 2022-07-28 NOTE — Patient Instructions (Addendum)
Thank you, [name] for allowing [place] to provide your care today. Today we discussed your shortness of breath and anxiety. We believe your grief and anxiety are playing a large role in your asthma attacks.    Please reach out to [facility] for grief counseling/support Toll-Free  [phone] [email or web address] More contact information  [facility] [place], [place] [address] [phone]  Please follow up with your lung doctor. I am sending you home with prescriptions for duonebs and a short course of prednisone. Make sure to pick up your trelegy as this will help prevent asthma/COPD attacks.   I have ordered the following medication/changed the following medications:  Start Duonebs every 6 hrs as needed for shortness of breath or wheezing Start prednisone 40 mg daily for 5 days Start hydroxyzine 25 mg three times daily as needed for anxiety  My Chart Access: [email or web address]  Please follow-up as needed  Please make sure to arrive 15 minutes prior to your next appointment. If you arrive late, you may be asked to reschedule.    We look forward to seeing you next time. Please call our clinic at [phone] if you have any questions or concerns. The best time to call is Monday-Friday from 9am-4pm, but there is someone available 24/7. If after hours or the weekend, call the main hospital number and ask for the Internal Medicine Resident On-Call. If you need medication refills, please notify your pharmacy one week in advance and they will send [place] a request.   Thank you for letting us take part in your care. Wishing you the best!  Lacinda Axon, MD 07/28/2022, 11:38 AM IM Resident, PGY-3 [place] 41:10

## 2022-07-28 NOTE — Telephone Encounter (Signed)
RTC to patient given an appointment for 10:45 AM to be seen in the Clinics.

## 2022-07-30 ENCOUNTER — Encounter: Payer: Self-pay | Admitting: Student

## 2022-07-30 ENCOUNTER — Telehealth: Payer: Self-pay

## 2022-07-30 MED ORDER — MONTELUKAST SODIUM 10 MG PO TABS: 10.0000 mg | ORAL_TABLET | Freq: Every day | ORAL | 3 refills | Status: DC

## 2022-07-30 MED ORDER — LIDOCAINE 5 % EX PTCH: 1.0000 | MEDICATED_PATCH | CUTANEOUS | 5 refills | Status: DC

## 2022-07-30 MED ORDER — EPINEPHRINE 0.3 MG/0.3ML IJ SOAJ: 0.3000 mg | INTRAMUSCULAR | 3 refills | Status: DC | PRN

## 2022-07-30 NOTE — Progress Notes (Addendum)
Internal Medicine Clinic Attending  Case discussed with Dr. Coy Saunas  At the time of the visit.  We reviewed the resident's history and exam and pertinent patient test results.  I agree with the assessment, diagnosis, and plan of care documented in the resident's note. Unfortunately, Kathleen [name] has had multiple recent asthma exacerbations, likely in the setting of not having access to maintenance inhaler as she has been out of Trelegy. It also appears she had to cancel her 7/31 appointment with pulmonology for Xolair injection. Dr. Coy Saunas discussed with Kathleen [name] the importance of maintenance therapy in preventing further asthma exacerbations and pt is scheduled for next Xolair injection on 8/28. Also unsure if she still has a supply of montelukast based on last dispense. I have asked Dr. Coy Saunas to clarify this and refill if needed. Will treat current exacerbation with short course of steroids and nebulizer therapy.

## 2022-07-30 NOTE — Assessment & Plan Note (Signed)
Patient with a history of asthma/COPD overlap here for acute office visit to be evaluated for possible asthma attack. Over the last 2 weeks, patient has had an episode of worsening shortness of breath requiring IV Solu-Medrol, DuoNeb and oral prednisone. These are all in the context of patient losing her sister last month. She has a history of generalized anxiety disorder that is untreated and has had frequent panic attacks/asthma attacks since her death.  States she feels overwhelmed most days.  States she has been out of her Trelegy inhaler and her nebulizer treatment does not always help. Of note, patient's Trelegy was previously given as a sample. Today, patient was found to be in mild respiratory distress with moderate wheezes so an DuoNeb treatment was given with improvement in her symptoms. She continued to have some expiratory wheezes after treatment but O2 remain stable on room air.  Discussed with patient about providing a short course of oral steroids and resuming her trelegy   Plan: -Start prednisone 40 mg daily for 5 days -Refill Trelegy inhaler 1 puff daily -Start DuoNeb nebulized treatment every 6 hours as needed for shortness of breath or wheezing -Follow-up with pulmonology -Hydroxyzine to help with anxiety -Follow-up as needed

## 2022-07-30 NOTE — Assessment & Plan Note (Signed)
Patient has a history of untreated generalized anxiety.  Her older sister recently passed which is because patient to have panic attacks that have led to multiple asthma attacks.  Per brother, patient has had difficulty sleeping and will benefit from something to help with her anxiety in the short-term.  Patient agreeable to starting as needed hydroxyzine for now with plan to reevaluate after she buries her sister.  Plan: -Start hydroxyzine 25 mg 3 times daily as needed for anxiety -GAD-7 at next office visit

## 2022-07-30 NOTE — Telephone Encounter (Signed)
PA  for pt (  TRELEGY ELLIPTA )  came through on [place] my meds with submitted with last office notes ... Awaiting approval or denial     UPDATE :     [employer] is reviewing your PA request. Typically an electronic response will be received within 24-72 hours.

## 2022-07-30 NOTE — Assessment & Plan Note (Addendum)
Patient's PHQ-9 is 24 today in the context of patient grieving her sister's death. States they are planning her funeral later this week.  Discussed referral patient to therapy/grief counseling, however patient states that she has too much going on and wants to focus on burying her sister first.  -[facility] grief counseling information given to patient on AVS -Consider starting SSRI and referral to therapy at next office visit

## 2022-07-30 NOTE — Assessment & Plan Note (Signed)
>>  ASSESSMENT AND PLAN FOR ASTHMA EXACERBATION WRITTEN ON 07/30/2022 10:33 AM BY AMPONSAH, Charisse March, MD  Patient with a history of asthma/COPD overlap here for acute office visit to be evaluated for possible asthma attack. Over the last 2 weeks, patient has had an episode of worsening shortness of breath requiring IV Solu-Medrol, DuoNeb and oral prednisone. These are all in the context of patient losing her sister last month. She has a history of generalized anxiety disorder that is untreated and has had frequent panic attacks/asthma attacks since her death.  States she feels overwhelmed most days.  States she has been out of her Trelegy inhaler and her nebulizer treatment does not always help. Of note, patient's Trelegy was previously given as a sample. Today, patient was found to be in mild respiratory distress with moderate wheezes so an DuoNeb treatment was given with improvement in her symptoms. She continued to have some expiratory wheezes after treatment but O2 remain stable on room air.  Discussed with patient about providing a short course of oral steroids and resuming her trelegy   Plan: -Start prednisone 40 mg daily for 5 days -Refill Trelegy inhaler 1 puff daily -Start DuoNeb nebulized treatment every 6 hours as needed for shortness of breath or wheezing -Follow-up with pulmonology -Hydroxyzine to help with anxiety -Follow-up as needed

## 2022-07-30 NOTE — Addendum Note (Signed)
Addended byLinwood Dibbles on: 07/30/2022 05:13 PM   Modules accepted: Orders

## 2022-08-02 NOTE — Telephone Encounter (Signed)
DECISION :     Message from Plan   Request Reference Number: [id number].   TRELEGY AER 200MCG is approved through 07/31/2023.    For further questions, call [employer] at [phone].        ( [place] )

## 2022-08-05 IMAGING — DX DG CHEST 1V PORT
1 series · 1 of 1 positions shown · non-contrast
Comparison: March 17, 2021.

CLINICAL DATA: Shortness of breath.

EXAM:
PORTABLE CHEST 1 VIEW

[chest ap]
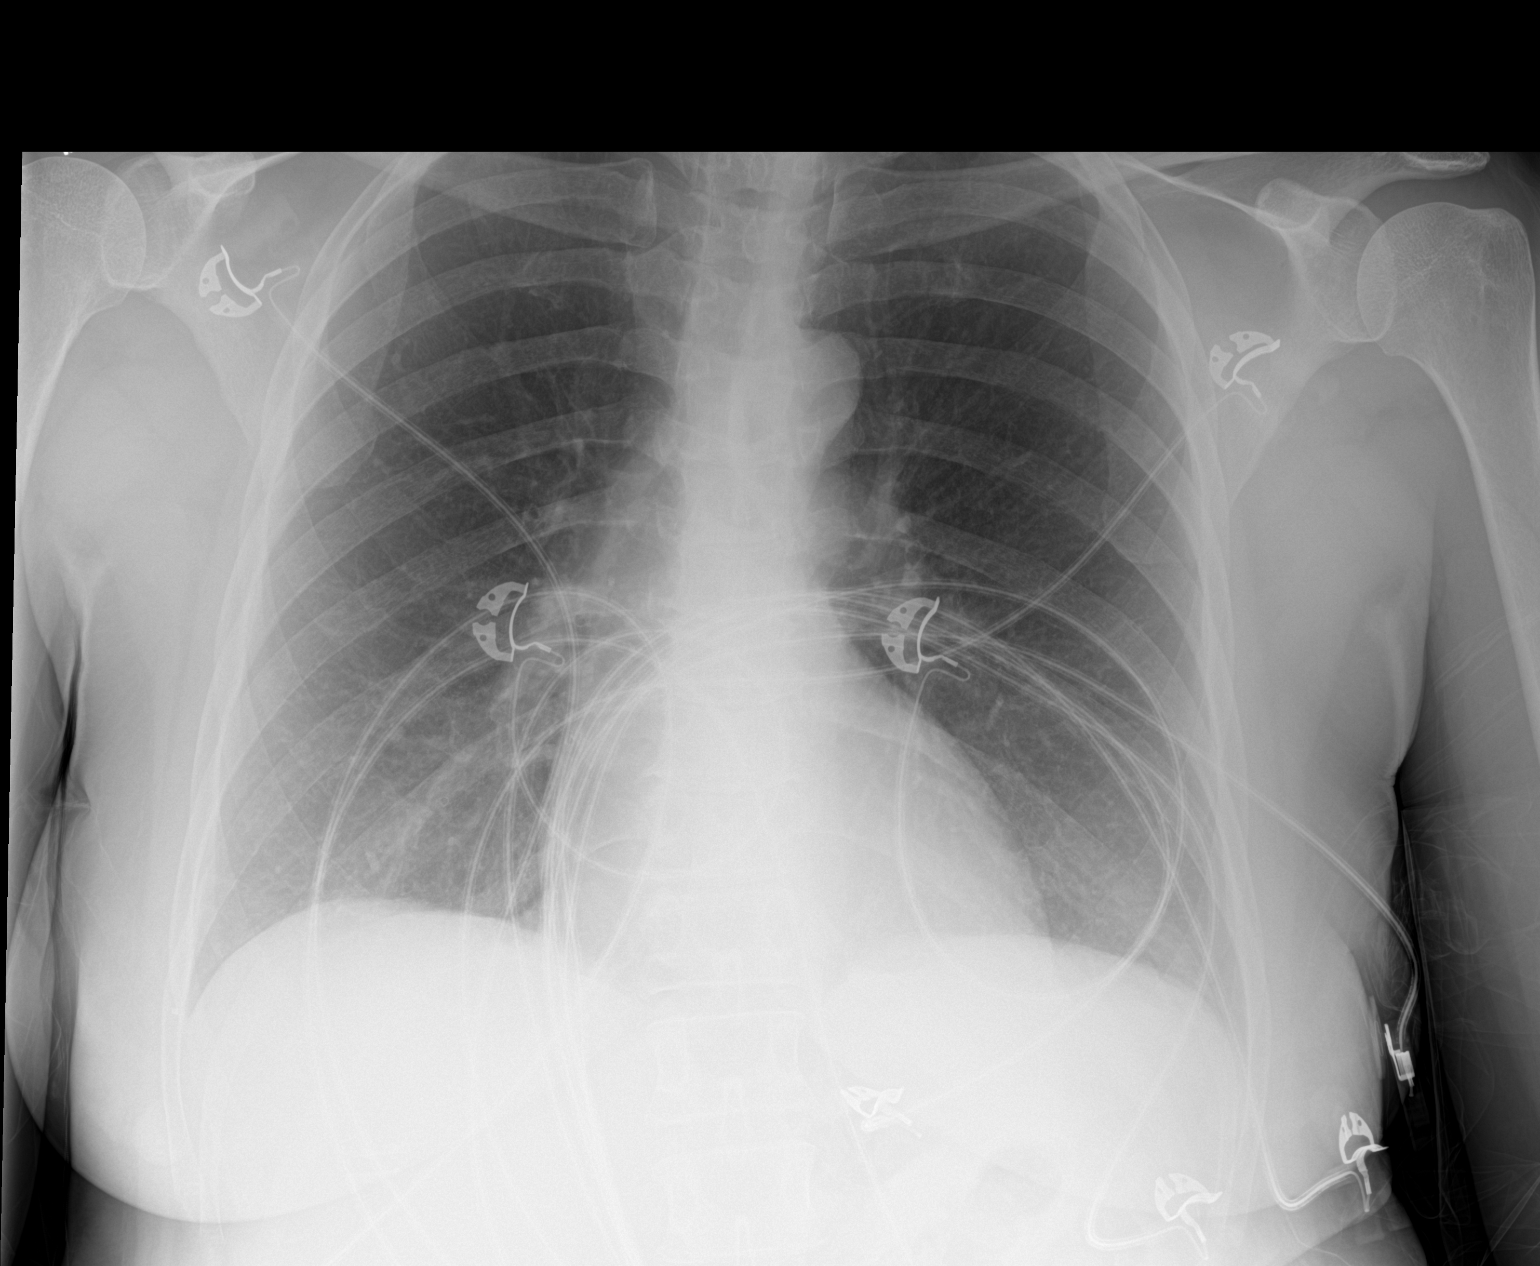

[1 of 1 positions shown; findings below may reference images not displayed]

FINDINGS: The heart size and mediastinal contours are within normal limits.
Both lungs are clear. The visualized skeletal structures are
unremarkable.
IMPRESSION: No active disease.

## 2022-08-09 ENCOUNTER — Other Ambulatory Visit: Payer: Medicaid Other | Admitting: Pharmacist

## 2022-08-12 ENCOUNTER — Telehealth: Payer: Self-pay | Admitting: *Deleted

## 2022-08-12 NOTE — Telephone Encounter (Signed)
Patient called in in obvious distress. States, "I can't breathe." Patient with hx of asthma exacerbations (most recent on 8/16). She is requesting Rx for prednisone. She is advised to go to the nearest ED. She agrees and will have son take her now.

## 2022-08-17 ENCOUNTER — Emergency Department (HOSPITAL_COMMUNITY): Payer: Medicaid Other

## 2022-08-17 ENCOUNTER — Inpatient Hospital Stay (HOSPITAL_COMMUNITY)
Admission: EM | Admit: 2022-08-17 | Discharge: 2022-08-20 | DRG: 192 | Disposition: A | Discharge status: Home / Self Care | Payer: Medicaid Other | Attending: Internal Medicine | Admitting: Internal Medicine

## 2022-08-17 ENCOUNTER — Encounter (HOSPITAL_COMMUNITY): Payer: Self-pay

## 2022-08-17 ENCOUNTER — Other Ambulatory Visit: Payer: Self-pay

## 2022-08-17 DIAGNOSIS — Z8673 Personal history of transient ischemic attack (TIA), and cerebral infarction without residual deficits: Secondary | ICD-10-CM | POA: Diagnosis not present

## 2022-08-17 DIAGNOSIS — K219 Gastro-esophageal reflux disease without esophagitis: Secondary | ICD-10-CM | POA: Diagnosis present

## 2022-08-17 DIAGNOSIS — Z888 Allergy status to other drugs, medicaments and biological substances status: Secondary | ICD-10-CM | POA: Diagnosis not present

## 2022-08-17 DIAGNOSIS — F32A Depression, unspecified: Secondary | ICD-10-CM | POA: Diagnosis present

## 2022-08-17 DIAGNOSIS — E785 Hyperlipidemia, unspecified: Secondary | ICD-10-CM | POA: Diagnosis present

## 2022-08-17 DIAGNOSIS — Z91030 Bee allergy status: Secondary | ICD-10-CM | POA: Diagnosis not present

## 2022-08-17 DIAGNOSIS — Z91018 Allergy to other foods: Secondary | ICD-10-CM

## 2022-08-17 DIAGNOSIS — F1210 Cannabis abuse, uncomplicated: Secondary | ICD-10-CM

## 2022-08-17 DIAGNOSIS — Z885 Allergy status to narcotic agent status: Secondary | ICD-10-CM

## 2022-08-17 DIAGNOSIS — Z8249 Family history of ischemic heart disease and other diseases of the circulatory system: Secondary | ICD-10-CM

## 2022-08-17 DIAGNOSIS — J439 Emphysema, unspecified: Secondary | ICD-10-CM | POA: Diagnosis present

## 2022-08-17 DIAGNOSIS — Z833 Family history of diabetes mellitus: Secondary | ICD-10-CM | POA: Diagnosis not present

## 2022-08-17 DIAGNOSIS — F17210 Nicotine dependence, cigarettes, uncomplicated: Secondary | ICD-10-CM | POA: Diagnosis present

## 2022-08-17 DIAGNOSIS — Z841 Family history of disorders of kidney and ureter: Secondary | ICD-10-CM

## 2022-08-17 DIAGNOSIS — J441 Chronic obstructive pulmonary disease with (acute) exacerbation: Secondary | ICD-10-CM | POA: Diagnosis present

## 2022-08-17 DIAGNOSIS — R300 Dysuria: Secondary | ICD-10-CM | POA: Diagnosis present

## 2022-08-17 DIAGNOSIS — J4521 Mild intermittent asthma with (acute) exacerbation: Secondary | ICD-10-CM

## 2022-08-17 DIAGNOSIS — Z20822 Contact with and (suspected) exposure to covid-19: Secondary | ICD-10-CM | POA: Diagnosis present

## 2022-08-17 DIAGNOSIS — R0989 Other specified symptoms and signs involving the circulatory and respiratory systems: Secondary | ICD-10-CM | POA: Diagnosis present

## 2022-08-17 DIAGNOSIS — Z79899 Other long term (current) drug therapy: Secondary | ICD-10-CM

## 2022-08-17 DIAGNOSIS — J449 Chronic obstructive pulmonary disease, unspecified: Secondary | ICD-10-CM

## 2022-08-17 DIAGNOSIS — T380X5A Adverse effect of glucocorticoids and synthetic analogues, initial encounter: Secondary | ICD-10-CM | POA: Diagnosis not present

## 2022-08-17 DIAGNOSIS — G8929 Other chronic pain: Secondary | ICD-10-CM | POA: Diagnosis present

## 2022-08-17 DIAGNOSIS — J4489 Other specified chronic obstructive pulmonary disease: Secondary | ICD-10-CM

## 2022-08-17 DIAGNOSIS — F419 Anxiety disorder, unspecified: Secondary | ICD-10-CM | POA: Diagnosis present

## 2022-08-17 DIAGNOSIS — M549 Dorsalgia, unspecified: Secondary | ICD-10-CM | POA: Diagnosis present

## 2022-08-17 DIAGNOSIS — E876 Hypokalemia: Secondary | ICD-10-CM

## 2022-08-17 DIAGNOSIS — D72829 Elevated white blood cell count, unspecified: Secondary | ICD-10-CM | POA: Diagnosis not present

## 2022-08-17 DIAGNOSIS — R350 Frequency of micturition: Secondary | ICD-10-CM | POA: Diagnosis present

## 2022-08-17 DIAGNOSIS — G4730 Sleep apnea, unspecified: Secondary | ICD-10-CM | POA: Diagnosis present

## 2022-08-17 DIAGNOSIS — Z720 Tobacco use: Secondary | ICD-10-CM

## 2022-08-17 LAB — URINALYSIS, ROUTINE W REFLEX MICROSCOPIC
Bilirubin Urine: NEGATIVE
Glucose, UA: NEGATIVE mg/dL
Hgb urine dipstick: NEGATIVE
Ketones, ur: NEGATIVE mg/dL
Nitrite: NEGATIVE
Protein, ur: NEGATIVE mg/dL
Specific Gravity, Urine: 1.014 (ref 1.005–1.030)
pH: 5 (ref 5.0–8.0)

## 2022-08-17 LAB — CBC WITH DIFFERENTIAL/PLATELET
Abs Immature Granulocytes: 0.02 10*3/uL (ref 0.00–0.07)
Basophils Absolute: 0.1 10*3/uL (ref 0.0–0.1)
Basophils Relative: 1 %
Eosinophils Absolute: 0.4 10*3/uL (ref 0.0–0.5)
Eosinophils Relative: 5 %
HCT: 47.2 % — ABNORMAL HIGH (ref 36.0–46.0)
Hemoglobin: 15.2 g/dL — ABNORMAL HIGH (ref 12.0–15.0)
Immature Granulocytes: 0 %
Lymphocytes Relative: 15 %
Lymphs Abs: 1.3 10*3/uL (ref 0.7–4.0)
MCH: 28.3 pg (ref 26.0–34.0)
MCHC: 32.2 g/dL (ref 30.0–36.0)
MCV: 87.9 fL (ref 80.0–100.0)
Monocytes Absolute: 0.4 10*3/uL (ref 0.1–1.0)
Monocytes Relative: 4 %
Neutro Abs: 6.4 10*3/uL (ref 1.7–7.7)
Neutrophils Relative %: 75 %
Platelets: 318 10*3/uL (ref 150–400)
RBC: 5.37 MIL/uL — ABNORMAL HIGH (ref 3.87–5.11)
RDW: 13.8 % (ref 11.5–15.5)
WBC: 8.5 10*3/uL (ref 4.0–10.5)
nRBC: 0 % (ref 0.0–0.2)

## 2022-08-17 LAB — MAGNESIUM: Magnesium: 2.2 mg/dL (ref 1.7–2.4)

## 2022-08-17 LAB — COMPREHENSIVE METABOLIC PANEL
ALT: 22 U/L (ref 0–44)
AST: 23 U/L (ref 15–41)
Albumin: 3.3 g/dL — ABNORMAL LOW (ref 3.5–5.0)
Alkaline Phosphatase: 53 U/L (ref 38–126)
Anion gap: 6 (ref 5–15)
BUN: 9 mg/dL (ref 8–23)
CO2: 26 mmol/L (ref 22–32)
Calcium: 8 mg/dL — ABNORMAL LOW (ref 8.9–10.3)
Chloride: 112 mmol/L — ABNORMAL HIGH (ref 98–111)
Creatinine, Ser: 1.05 mg/dL — ABNORMAL HIGH (ref 0.44–1.00)
GFR, Estimated: >60 mL/min (ref >60)
Glucose, Bld: 103 mg/dL — ABNORMAL HIGH (ref 70–99)
Potassium: 3.3 mmol/L — ABNORMAL LOW (ref 3.5–5.1)
Sodium: 144 mmol/L (ref 135–145)
Total Bilirubin: 0.4 mg/dL (ref 0.3–1.2)
Total Protein: 5.8 g/dL — ABNORMAL LOW (ref 6.5–8.1)

## 2022-08-17 LAB — SARS CORONAVIRUS 2 BY RT PCR: SARS Coronavirus 2 by RT PCR: NEGATIVE

## 2022-08-17 MED ORDER — ONDANSETRON HCL 4 MG PO TABS: 4.0000 mg | ORAL_TABLET | Freq: Four times a day (QID) | ORAL | Status: DC | PRN

## 2022-08-17 MED ORDER — ENSURE ENLIVE PO LIQD
237.0000 mL | Freq: Two times a day (BID) | ORAL | Status: DC
  Administered 2022-08-18 – 2022-08-19 (×4): 237 mL via ORAL

## 2022-08-17 MED ORDER — LORATADINE 10 MG PO TABS
10.0000 mg | ORAL_TABLET | Freq: Every day | ORAL | Status: DC
  Administered 2022-08-17 – 2022-08-20 (×4): 10 mg via ORAL
  Filled 2022-08-17 (×4): qty 1

## 2022-08-17 MED ORDER — PREDNISONE 20 MG PO TABS
40.0000 mg | ORAL_TABLET | Freq: Every day | ORAL | Status: DC
  Administered 2022-08-19 – 2022-08-20 (×2): 40 mg via ORAL
  Filled 2022-08-17 (×2): qty 2

## 2022-08-17 MED ORDER — SODIUM CHLORIDE 0.9 % IV BOLUS
500.0000 mL | Freq: Once | INTRAVENOUS | Status: AC
  Administered 2022-08-17: 500 mL via INTRAVENOUS

## 2022-08-17 MED ORDER — MELATONIN 3 MG PO TABS
3.0000 mg | ORAL_TABLET | Freq: Every day | ORAL | Status: AC
  Administered 2022-08-17 – 2022-08-18 (×2): 3 mg via ORAL
  Filled 2022-08-17 (×2): qty 1

## 2022-08-17 MED ORDER — MONTELUKAST SODIUM 10 MG PO TABS
10.0000 mg | ORAL_TABLET | Freq: Every day | ORAL | Status: DC
  Administered 2022-08-17 – 2022-08-19 (×3): 10 mg via ORAL
  Filled 2022-08-17 (×3): qty 1

## 2022-08-17 MED ORDER — PANTOPRAZOLE SODIUM 40 MG PO TBEC
40.0000 mg | DELAYED_RELEASE_TABLET | Freq: Every day | ORAL | Status: DC
  Administered 2022-08-17 – 2022-08-18 (×2): 40 mg via ORAL
  Filled 2022-08-17 (×2): qty 1

## 2022-08-17 MED ORDER — HYDROXYZINE HCL 25 MG PO TABS
25.0000 mg | ORAL_TABLET | Freq: Three times a day (TID) | ORAL | Status: DC | PRN
Start: 2022-08-17 — End: 2022-08-20
  Administered 2022-08-17 – 2022-08-19 (×3): 25 mg via ORAL
  Filled 2022-08-17 (×3): qty 1

## 2022-08-17 MED ORDER — LIDOCAINE 5 % EX PTCH
1.0000 | MEDICATED_PATCH | CUTANEOUS | Status: AC
  Administered 2022-08-17: 1 via TRANSDERMAL
  Filled 2022-08-17: qty 1

## 2022-08-17 MED ORDER — ALBUTEROL SULFATE (2.5 MG/3ML) 0.083% IN NEBU
10.0000 mg/h | INHALATION_SOLUTION | Freq: Once | RESPIRATORY_TRACT | Status: AC
Start: 2022-08-17 — End: 2022-08-17
  Administered 2022-08-17: 10 mg/h via RESPIRATORY_TRACT
  Filled 2022-08-17: qty 12

## 2022-08-17 MED ORDER — METHYLPREDNISOLONE SODIUM SUCC 40 MG IJ SOLR
40.0000 mg | Freq: Two times a day (BID) | INTRAMUSCULAR | Status: AC
  Administered 2022-08-17 – 2022-08-18 (×2): 40 mg via INTRAVENOUS
  Filled 2022-08-17 (×2): qty 1

## 2022-08-17 MED ORDER — ENOXAPARIN SODIUM 40 MG/0.4ML IJ SOSY
40.0000 mg | PREFILLED_SYRINGE | INTRAMUSCULAR | Status: DC
  Administered 2022-08-17 – 2022-08-19 (×3): 40 mg via SUBCUTANEOUS
  Filled 2022-08-17 (×3): qty 0.4

## 2022-08-17 MED ORDER — OXYCODONE HCL 5 MG PO TABS
5.0000 mg | ORAL_TABLET | Freq: Four times a day (QID) | ORAL | Status: AC | PRN
  Administered 2022-08-17 – 2022-08-18 (×2): 5 mg via ORAL
  Filled 2022-08-17 (×2): qty 1

## 2022-08-17 MED ORDER — POTASSIUM CHLORIDE CRYS ER 20 MEQ PO TBCR
40.0000 meq | EXTENDED_RELEASE_TABLET | Freq: Every day | ORAL | Status: DC
  Administered 2022-08-17 – 2022-08-18 (×2): 40 meq via ORAL
  Filled 2022-08-17 (×2): qty 2

## 2022-08-17 MED ORDER — CALCIUM CARBONATE ANTACID 500 MG PO CHEW
1.0000 | CHEWABLE_TABLET | Freq: Three times a day (TID) | ORAL | Status: DC
  Administered 2022-08-18 – 2022-08-20 (×7): 200 mg via ORAL
  Filled 2022-08-17 (×7): qty 1

## 2022-08-17 MED ORDER — ONDANSETRON HCL 4 MG/2ML IJ SOLN
4.0000 mg | Freq: Four times a day (QID) | INTRAMUSCULAR | Status: DC | PRN
  Administered 2022-08-18: 4 mg via INTRAVENOUS
  Filled 2022-08-17: qty 2

## 2022-08-17 MED ORDER — IPRATROPIUM-ALBUTEROL 0.5-2.5 (3) MG/3ML IN SOLN
3.0000 mL | Freq: Four times a day (QID) | RESPIRATORY_TRACT | Status: DC
  Administered 2022-08-17 – 2022-08-18 (×3): 3 mL via RESPIRATORY_TRACT
  Filled 2022-08-17 (×3): qty 3

## 2022-08-17 MED ORDER — ACETAMINOPHEN 650 MG RE SUPP: 650.0000 mg | Freq: Four times a day (QID) | RECTAL | Status: DC | PRN

## 2022-08-17 MED ORDER — NICOTINE 7 MG/24HR TD PT24
7.0000 mg | MEDICATED_PATCH | Freq: Every day | TRANSDERMAL | Status: AC
  Administered 2022-08-17 – 2022-08-18 (×2): 7 mg via TRANSDERMAL
  Filled 2022-08-17 (×2): qty 1

## 2022-08-17 MED ORDER — ACETAMINOPHEN 325 MG PO TABS
650.0000 mg | ORAL_TABLET | Freq: Four times a day (QID) | ORAL | Status: DC | PRN
  Administered 2022-08-17 – 2022-08-18 (×2): 650 mg via ORAL
  Filled 2022-08-17 (×2): qty 2

## 2022-08-17 MED ORDER — GUAIFENESIN ER 600 MG PO TB12
1200.0000 mg | ORAL_TABLET | Freq: Two times a day (BID) | ORAL | Status: DC
  Administered 2022-08-17 – 2022-08-18 (×3): 1200 mg via ORAL
  Filled 2022-08-17 (×3): qty 2

## 2022-08-17 NOTE — ED Notes (Signed)
Resp. notified

## 2022-08-17 NOTE — Progress Notes (Signed)
RT entered the room and explained that I was going to get an ABG. I told the Pt I wouldn't stick without telling her. RT stuck for the ABG in her right brachial and I got some blood return and the Pt jerked her arm and the needle came out and almost stuck me. The Pt's left arm came up and the Pt said I almost hit you. I told the Pt that I would not try again because I didn't want to be hit. She said I dont want that ABG. RT spoke to the doctor and the nurse

## 2022-08-17 NOTE — H&P (Signed)
History and Physical    Patient: Kathleen [name] ULA:[id number] DOB: [birth year] DOA: 08/17/2022 DOS: the patient was seen and examined on 08/17/2022 PCP: Riesa Pope, MD  Patient coming from: Home  Chief Complaint:  Chief Complaint  Patient presents with   Asthma   HPI: Kathleen [name] is a 61 y.o. female with medical history significant of COPD, tobacco abuse, GERD. Presenting with dyspnea. Symptoms started 2 days ago. She had increased productive cough. She didn't have any fever or sick contacts. She tried her inhalers/nebs, but they didn't help. She also had increased urinary frequency during this time and some burning w/ urination. When her symptoms did not improve this morning, she called for EMS. She reports that she still smokes cigarettes and marijuana daily. She denies any other aggravating or alleviating factors.   Review of Systems: As mentioned in the history of present illness. All other systems reviewed and are negative. Past Medical History:  Diagnosis Date   Anxiety    Arthritis    hands   Asthma    2 sets of PFT's in 04/09 without sign variability. Last set with significant decrease in FEV1 with saline alone, suggesting Asthma but  recommended clinical corelation    Asthma    Bipolar 1 disorder [facility])    therapist is Joy and is followed by [facility]   Blackout    negative work up including ESR, ANA, opthalmology referral, carotid dopplers, 2D echo, MRI and EEG.   BREAST LUMP 03/25/2008   Annotation: bilaterally Qualifier: Diagnosis of  By: Tomasa Hosteller MD, Edmon Crape.    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 Further work up includes CRP, ESR, SPEP/UPEP,  hepatitis erology, A1C , repeat ANA   COPD (chronic obstructive pulmonary disease) ([place])    COPD exacerbation ([place]) 03/31/2019   Depression    DUB (dysfunctional uterine bleeding)    and pelvic pain, with negative endometrial bx in 07/09 and transvaginal U/S significant for mild fibroids in 0/09.   Emphysema of lung ([place])    GERD (gastroesophageal reflux disease)    Hallucinations 03/18/2008   Qualifier: Diagnosis of  By: Redmond Pulling  MD, Mateo Flow     Hyperlipidemia    Lower extremity edema    Neg ABI's, normal 2D echo, normal albumin   Menorrhagia    Ovarian cyst    Polysubstance abuse ([place])    none since March 17,2009.   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 She has a 7.50 pack-year smoking history. She has never used smokeless tobacco.  She reports current alcohol use. She reports that she does not currently use drugs after having used the following drugs: Marijuana.  Allergies  Allergen Reactions   Bee Venom    Peach [Prunus Persica] Other (See Comments)    Throat swells , breakout   Topamax [Topiramate] Other (See Comments)    Hallucinations    Tramadol Nausea Only    Family History  Problem Relation Age of Onset   Colon cancer Mother 71   Breast cancer Mother 29   Rectal cancer Mother    Diabetes Father    Hypertension Father    Kidney disease Father    Colon cancer Father 45   Prostate cancer Father    Cancer Father    Kidney disease Sister    Cancer Brother    Breast cancer Maternal Grandmother 103   Esophageal cancer Neg Hx    Stomach cancer Neg Hx     Prior to  Admission medications   Medication Sig Start Date End Date Taking? Authorizing Provider  albuterol (VENTOLIN HFA) 108 (90 Base) MCG/ACT inhaler Inhale 2 puffs into the lungs every 6 (six) hours as needed for wheezing or shortness of breath. 07/07/22  Yes Katsadouros, Vasilios, MD  Fluticasone-Umeclidin-Vilant (TRELEGY ELLIPTA) 200-62.5-25 MCG/ACT AEPB Inhale 1 puff into the lungs daily. 07/28/22  Yes Lacinda Axon, MD  hydrOXYzine (ATARAX) 25 MG tablet Take 1 tablet (25 mg total) by mouth 3 (three) times daily as needed for anxiety. 07/28/22  Yes Lacinda Axon, MD  ipratropium-albuterol (DUONEB) 0.5-2.5 (3) MG/3ML SOLN Take 3 mLs by nebulization every 6 (six) hours as needed. Patient taking differently: Take 3 mLs by nebulization every 6 (six) hours as needed (Wheezing or shortness of breath). 07/28/22  Yes Lacinda Axon, MD  lidocaine (LIDODERM) 5 % Place 1 patch onto the skin daily. Remove & Discard patch within 12 hours or as directed by MD 07/30/22  Yes Lacinda Axon, MD  loratadine (CLARITIN) 10 MG tablet Take 1 tablet (10 mg total) by mouth daily. 03/09/22  Yes Hunsucker, [name], MD  montelukast (SINGULAIR) 10 MG tablet Take 1 tablet (10 mg total) by mouth at bedtime. 07/30/22  Yes Amponsah, Charisse March, MD  ondansetron (ZOFRAN) 4 MG tablet TAKE 1 TABLET BY MOUTH ONCE DAILY AS NEEDED FOR NAUSEA FOR VOMITING Patient taking differently: Take 4 mg by mouth daily as needed for nausea or vomiting. 02/18/21  Yes [name], MD  SPIRIVA HANDIHALER 18 MCG inhalation capsule Place 18 mcg into inhaler and inhale daily. 07/01/22  Yes [provider]  EPINEPHrine (EPIPEN 2-PAK) 0.3 mg/0.3 mL IJ SOAJ injection Inject 0.3 mg into the muscle as needed for anaphylaxis. 07/30/22   Lacinda Axon, MD  pantoprazole (PROTONIX) 40 MG tablet Take 1 tablet (40 mg total) by mouth daily. Patient not taking: Reported on 08/17/2022 08/27/21   [name], MD    Physical Exam: Vitals:    08/17/22 1300 08/17/22 1330 08/17/22 1345 08/17/22 1400  BP: (!) 141/95 (!) 135/106  (!) 133/101  Pulse: 94 91 88 87  Resp: (!) 28 (!) 24 (!) 21 (!) 21  Temp:      TempSrc:      SpO2: 97% 97% 97% 97%  Weight:      Height:       General: 61 y.o. female resting in bed in NAD Eyes: PERRL, normal sclera ENMT: Nares patent w/o discharge, orophaynx clear, dentition normal, ears w/o discharge/lesions/ulcers Neck: Supple, trachea midline Cardiovascular: RRR, +S1,  S2, no m/g/r, equal pulses throughout Respiratory: diffuse expiratory wheeze, no r/r, increased WOB on 2L [place] GI: BS+, NDNT, no masses noted, no organomegaly noted MSK: No e/c/c Neuro: A&O x 3, no focal deficits Psyc: Appropriate interaction and affect, calm/cooperative  Data Reviewed:  Lab Results  Component Value Date   NA 144 08/17/2022   K 3.3 (L) 08/17/2022   CO2 26 08/17/2022   GLUCOSE 103 (H) 08/17/2022   BUN 9 08/17/2022   CREATININE 1.05 (H) 08/17/2022   CALCIUM 8.0 (L) 08/17/2022   EGFR 75 07/07/2022   GFRNONAA >60 08/17/2022   Lab Results  Component Value Date   WBC 8.5 08/17/2022   HGB 15.2 (H) 08/17/2022   HCT 47.2 (H) 08/17/2022   MCV 87.9 08/17/2022   PLT 318 08/17/2022   CXR: Mild hyperinflation but no acute pulmonary findings.  Assessment and Plan: COPD exacerbation     - admit to inpt, tele     - check ABG     - wean O2 as able     - steroids, nebs, guaifenesin     - COVID pending  Hypokalemia Hypocalcemia     - replace K+; check Mg2+     - Ca2+ corrects to 8.6; can replace orally     - check PTH  Tobacco abuse Marijuana abuse     - counsel against further use  GERD     - PPI  Dysuria     - check UA  Advance Care Planning:   Code Status: FULL  Consults: None  Family Communication: None at bedside  Severity of Illness: The appropriate patient status for this patient is INPATIENT. Inpatient status is judged to be reasonable and necessary in order to provide the required  intensity of service to ensure the patient's safety. The patient's presenting symptoms, physical exam findings, and initial radiographic and laboratory data in the context of their chronic comorbidities is felt to place them at high risk for further clinical deterioration. Furthermore, it is not anticipated that the patient will be medically stable for discharge from the hospital within 2 midnights of admission.   * I certify that at the point of admission it is my clinical judgment that the patient will require inpatient hospital care spanning beyond 2 midnights from the point of admission due to high intensity of service, high risk for further deterioration and high frequency of surveillance required.*  Time spent in coordination of this H&P: 45 minutes  Author: Jonnie Finner, DO 08/17/2022 2:14 PM  For on call review [email or web address].

## 2022-08-17 NOTE — ED Triage Notes (Addendum)
BIB ENS history of Asthma, symptoms started yesterday, Solumedrol 125 mg, 2 g Mag. 0.3 Epi, 10 mg albuterol and '1mg'$  Atrovent. #18 L AC, bil wheezing continues. 170/100-89-99% with tx, 24-16 rsp, CBG 98

## 2022-08-17 NOTE — Progress Notes (Signed)
Patient informed this nurse that her son brought her bag of toiletries and home medication to her room. This nurse informed charge nurse of this and obtained patient home medication pharmacy securement bag and documentation. Patient was compliant and produced the medication she stated she had in her bag to this nurse. The medication was reviewed and annotated on the pharmacy documentation with the patient present, the documentation was verified by both patient and nurse, a patient specific sticker was placed on the securement bag, patient signed the pharmacy documentation and a copy of the signed documentation was placed in the securement bag in front of the patient prior to exiting the room. This nurse gave the securement bag and documentation to the charge nurse for transport of home medications to pharmacy.

## 2022-08-17 NOTE — ED Provider Notes (Signed)
[facility] DEPT Provider Note   CSN: [id number] Arrival date & time: 08/17/22  0962     History  Chief Complaint  Patient presents with   Asthma    MONIFAH FREEHLING is a 61 y.o. female.  Patient complains of shortness of breath.  She has a history of COPD and has seen pulmonary.  They told her she may be needing oxygen at home at some point.  Patient states she has had a cough with yellow sputum production for couple days  The history is provided by the patient and medical records. No language interpreter was used.  Asthma This is a recurrent problem. The problem occurs constantly. The problem has not changed since onset.Associated symptoms include shortness of breath. Pertinent negatives include no chest pain, no abdominal pain and no headaches. Nothing aggravates the symptoms. Nothing relieves the symptoms. She has tried nothing for the symptoms.       Home Medications Prior to Admission medications   Medication Sig Start Date End Date Taking? Authorizing Provider  albuterol (VENTOLIN HFA) 108 (90 Base) MCG/ACT inhaler Inhale 2 puffs into the lungs every 6 (six) hours as needed for wheezing or shortness of breath. 07/07/22  Yes Katsadouros, Vasilios, MD  Fluticasone-Umeclidin-Vilant (TRELEGY ELLIPTA) 200-62.5-25 MCG/ACT AEPB Inhale 1 puff into the lungs daily. 07/28/22  Yes Lacinda Axon, MD  hydrOXYzine (ATARAX) 25 MG tablet Take 1 tablet (25 mg total) by mouth 3 (three) times daily as needed for anxiety. 07/28/22  Yes Lacinda Axon, MD  ipratropium-albuterol (DUONEB) 0.5-2.5 (3) MG/3ML SOLN Take 3 mLs by nebulization every 6 (six) hours as needed. Patient taking differently: Take 3 mLs by nebulization every 6 (six) hours as needed (Wheezing or shortness of breath). 07/28/22  Yes Lacinda Axon, MD  lidocaine (LIDODERM) 5 % Place 1 patch onto the skin daily. Remove & Discard patch within 12 hours or as directed by MD 07/30/22  Yes Lacinda Axon, MD  loratadine (CLARITIN) 10 MG tablet Take 1 tablet (10 mg total) by mouth daily. 03/09/22  Yes Hunsucker, [name], MD  montelukast (SINGULAIR) 10 MG tablet Take 1 tablet (10 mg total) by mouth at bedtime. 07/30/22  Yes Amponsah, Charisse March, MD  ondansetron (ZOFRAN) 4 MG tablet TAKE 1 TABLET BY MOUTH ONCE DAILY AS NEEDED FOR NAUSEA FOR VOMITING Patient taking differently: Take 4 mg by mouth daily as needed for nausea or vomiting. 02/18/21  Yes [name], MD  SPIRIVA HANDIHALER 18 MCG inhalation capsule Place 18 mcg into inhaler and inhale daily. 07/01/22  Yes [provider]  EPINEPHrine (EPIPEN 2-PAK) 0.3 mg/0.3 mL IJ SOAJ injection Inject 0.3 mg into the muscle as needed for anaphylaxis. 07/30/22   Lacinda Axon, MD  pantoprazole (PROTONIX) 40 MG tablet Take 1 tablet (40 mg total) by mouth daily. Patient not taking: Reported on 08/17/2022 08/27/21   [name], MD      Allergies    Bee venom, Peach [prunus persica], Topamax [topiramate], and Tramadol    Review of Systems   Review of Systems  Constitutional:  Negative for appetite change and fatigue.  HENT:  Negative for congestion, ear discharge and sinus pressure.   Eyes:  Negative for discharge.  Respiratory:  Positive for cough and shortness of breath.   Cardiovascular:  Negative for chest pain.  Gastrointestinal:  Negative for abdominal pain and diarrhea.  Genitourinary:  Negative for frequency and hematuria.  Musculoskeletal:  Negative for back pain.  Skin:  Negative for rash.  Neurological:  Negative for seizures and headaches.  Psychiatric/Behavioral:  Negative for hallucinations.     Physical Exam Updated Vital Signs BP (!) 135/106   Pulse 88   Temp 98 F (36.7 C) (Oral)   Resp (!) 21   Ht '5\' 2"'$  (1.575 m)   Wt 62.6 kg   LMP 11/08/2014   SpO2 97%   BMI 25.24 kg/m  Physical Exam Vitals and nursing note reviewed.  Constitutional:      Appearance: She is well-developed.  HENT:      Head: Normocephalic.     Nose: Nose normal.  Eyes:     General: No scleral icterus.    Conjunctiva/sclera: Conjunctivae normal.  Neck:     Thyroid: No thyromegaly.  Cardiovascular:     Rate and Rhythm: Normal rate and regular rhythm.     Heart sounds: No murmur heard.    No friction rub. No gallop.  Pulmonary:     Breath sounds: No stridor. Wheezing present. No rales.  Chest:     Chest wall: No tenderness.  Abdominal:     General: There is no distension.     Tenderness: There is no abdominal tenderness. There is no rebound.  Musculoskeletal:        General: Normal range of motion.     Cervical back: Neck supple.  Lymphadenopathy:     Cervical: No cervical adenopathy.  Skin:    Findings: No erythema or rash.  Neurological:     Mental Status: She is alert and oriented to person, place, and time.     Motor: No abnormal muscle tone.     Coordination: Coordination normal.  Psychiatric:        Behavior: Behavior normal.     ED Results / Procedures / Treatments   Labs (all labs ordered are listed, but only abnormal results are displayed) Labs Reviewed  CBC WITH DIFFERENTIAL/PLATELET - Abnormal; Notable for the following components:      Result Value   RBC 5.37 (*)    Hemoglobin 15.2 (*)    HCT 47.2 (*)    All other components within normal limits  COMPREHENSIVE METABOLIC PANEL - Abnormal; Notable for the following components:   Potassium 3.3 (*)    Chloride 112 (*)    Glucose, Bld 103 (*)    Creatinine, Ser 1.05 (*)    Calcium 8.0 (*)    Total Protein 5.8 (*)    Albumin 3.3 (*)    All other components within normal limits    EKG None  Radiology DG Chest Port 1 View  Result Date: 08/17/2022 CLINICAL DATA:  Shortness of breath.  History of asthma. EXAM: PORTABLE CHEST 1 VIEW COMPARISON:  Multiple previous chest x-rays. The most recent is 05/22/2022 FINDINGS: The cardiac silhouette, mediastinal and hilar contours are within normal limits and stable. The lungs are  clear of an acute process. Mild hyperinflation noted. Rounded density in the left lower lobe is consistent with a left nipple shadow. IMPRESSION: Mild hyperinflation but no acute pulmonary findings. Electronically Signed   By: Marijo Sanes M.D.   On: 08/17/2022 09:42    Procedures Procedures    Medications Ordered in ED Medications  albuterol (PROVENTIL) (2.5 MG/3ML) 0.083% nebulizer solution (10 mg/hr Nebulization Given 08/17/22 0906)  sodium chloride 0.9 % bolus 500 mL (0 mLs Intravenous Stopped 08/17/22 1213)    ED Course/ Medical Decision Making/ A&P  Medical Decision Making Amount and/or Complexity of Data Reviewed Labs: ordered. Radiology: ordered.  Risk Prescription drug management. Decision regarding hospitalization.  This patient presents to the ED for concern of short of breath, this involves an extensive number of treatment options, and is a complaint that carries with it a high risk of complications and morbidity.  The differential diagnosis includes pneumonia, exacerbation of COPD   Co morbidities that complicate the patient evaluation  COPD   Additional history obtained:  Additional history obtained from patient External records from outside source obtained and reviewed including hospital records   Lab Tests:  I Ordered, and personally interpreted labs.  The pertinent results include: Labs show mild decreased potassium at 3.3   Imaging Studies ordered:  I ordered imaging studies including chest x-ray I independently visualized and interpreted imaging which showed hyperventilation I agree with the radiologist interpretation   Cardiac Monitoring: / EKG:  The patient was maintained on a cardiac monitor.  I personally viewed and interpreted the cardiac monitored which showed an underlying rhythm of: Sinus tach   Consultations Obtained:  I requested consultation with the hospitalist,  and discussed lab and imaging findings as  well as pertinent plan - they recommend: Admit   Problem List / ED Course / Critical interventions / Medication management  COPD exacerbation I ordered medication including steroids and albuterol and Atrovent nebs Reevaluation of the patient after these medicines showed that the patient improved I have reviewed the patients home medicines and have made adjustments as needed   Social Determinants of Health:  None   Test / Admission - Considered:  None  Patient with exacerbation of COPD and mild hypoxia.  After 20 mg of albuterol and 1 mg of Atrovent she still had wheezing with O2 sats of 88%.  She will be admitted to medicine        Final Clinical Impression(s) / ED Diagnoses Final diagnoses:  Exacerbation of intermittent asthma, unspecified asthma severity    Rx / DC Orders ED Discharge Orders     None         Milton Ferguson, MD 08/20/22 1047

## 2022-08-17 NOTE — ED Notes (Signed)
Patient refused Covid nasal swab.

## 2022-08-18 DIAGNOSIS — R300 Dysuria: Secondary | ICD-10-CM

## 2022-08-18 DIAGNOSIS — F1210 Cannabis abuse, uncomplicated: Secondary | ICD-10-CM

## 2022-08-18 DIAGNOSIS — J441 Chronic obstructive pulmonary disease with (acute) exacerbation: Secondary | ICD-10-CM | POA: Diagnosis not present

## 2022-08-18 DIAGNOSIS — K219 Gastro-esophageal reflux disease without esophagitis: Secondary | ICD-10-CM

## 2022-08-18 DIAGNOSIS — E876 Hypokalemia: Secondary | ICD-10-CM | POA: Diagnosis not present

## 2022-08-18 LAB — COMPREHENSIVE METABOLIC PANEL
ALT: 25 U/L (ref 0–44)
AST: 21 U/L (ref 15–41)
Albumin: 3.6 g/dL (ref 3.5–5.0)
Alkaline Phosphatase: 64 U/L (ref 38–126)
Anion gap: 7 (ref 5–15)
BUN: 14 mg/dL (ref 8–23)
CO2: 27 mmol/L (ref 22–32)
Calcium: 9.2 mg/dL (ref 8.9–10.3)
Chloride: 105 mmol/L (ref 98–111)
Creatinine, Ser: 0.91 mg/dL (ref 0.44–1.00)
GFR, Estimated: >60 mL/min (ref >60)
Glucose, Bld: 113 mg/dL — ABNORMAL HIGH (ref 70–99)
Potassium: 3.8 mmol/L (ref 3.5–5.1)
Sodium: 139 mmol/L (ref 135–145)
Total Bilirubin: 0.6 mg/dL (ref 0.3–1.2)
Total Protein: 6.9 g/dL (ref 6.5–8.1)

## 2022-08-18 LAB — CBC
HCT: 47.3 % — ABNORMAL HIGH (ref 36.0–46.0)
Hemoglobin: 15.2 g/dL — ABNORMAL HIGH (ref 12.0–15.0)
MCH: 28.1 pg (ref 26.0–34.0)
MCHC: 32.1 g/dL (ref 30.0–36.0)
MCV: 87.6 fL (ref 80.0–100.0)
Platelets: 331 10*3/uL (ref 150–400)
RBC: 5.4 MIL/uL — ABNORMAL HIGH (ref 3.87–5.11)
RDW: 13.7 % (ref 11.5–15.5)
WBC: 14.9 10*3/uL — ABNORMAL HIGH (ref 4.0–10.5)
nRBC: 0 % (ref 0.0–0.2)

## 2022-08-18 LAB — HIV ANTIBODY (ROUTINE TESTING W REFLEX): HIV Screen 4th Generation wRfx: NONREACTIVE

## 2022-08-18 MED ORDER — ALUM & MAG HYDROXIDE-SIMETH 200-200-20 MG/5ML PO SUSP
30.0000 mL | ORAL | Status: DC | PRN
  Administered 2022-08-18: 30 mL via ORAL
  Filled 2022-08-18: qty 30

## 2022-08-18 MED ORDER — MOMETASONE FURO-FORMOTEROL FUM 200-5 MCG/ACT IN AERO
2.0000 | INHALATION_SPRAY | Freq: Two times a day (BID) | RESPIRATORY_TRACT | Status: DC
  Administered 2022-08-18 – 2022-08-20 (×4): 2 via RESPIRATORY_TRACT
  Filled 2022-08-18: qty 8.8

## 2022-08-18 MED ORDER — PANTOPRAZOLE SODIUM 40 MG PO TBEC
40.0000 mg | DELAYED_RELEASE_TABLET | Freq: Two times a day (BID) | ORAL | Status: DC
  Administered 2022-08-18 – 2022-08-20 (×4): 40 mg via ORAL
  Filled 2022-08-18 (×4): qty 1

## 2022-08-18 MED ORDER — IPRATROPIUM-ALBUTEROL 0.5-2.5 (3) MG/3ML IN SOLN
3.0000 mL | RESPIRATORY_TRACT | Status: DC
  Administered 2022-08-18 – 2022-08-20 (×7): 3 mL via RESPIRATORY_TRACT
  Filled 2022-08-18 (×7): qty 3

## 2022-08-18 MED ORDER — ADULT MULTIVITAMIN W/MINERALS CH
1.0000 | ORAL_TABLET | Freq: Every day | ORAL | Status: DC
  Administered 2022-08-18 – 2022-08-20 (×3): 1 via ORAL
  Filled 2022-08-18 (×3): qty 1

## 2022-08-18 MED ORDER — IPRATROPIUM-ALBUTEROL 0.5-2.5 (3) MG/3ML IN SOLN
3.0000 mL | RESPIRATORY_TRACT | Status: DC | PRN
  Administered 2022-08-18 (×3): 3 mL via RESPIRATORY_TRACT
  Filled 2022-08-18 (×3): qty 3

## 2022-08-18 MED ORDER — GUAIFENESIN-DM 100-10 MG/5ML PO SYRP
5.0000 mL | ORAL_SOLUTION | ORAL | Status: DC | PRN
Start: 2022-08-18 — End: 2022-08-20
  Administered 2022-08-18: 5 mL via ORAL
  Filled 2022-08-18: qty 5

## 2022-08-18 MED ORDER — SODIUM CHLORIDE 0.9 % IV SOLN
1.0000 g | Freq: Every day | INTRAVENOUS | Status: DC
  Administered 2022-08-18 – 2022-08-19 (×2): 1 g via INTRAVENOUS
  Filled 2022-08-18 (×3): qty 10

## 2022-08-18 MED ORDER — OXYCODONE HCL 5 MG PO TABS
5.0000 mg | ORAL_TABLET | Freq: Three times a day (TID) | ORAL | Status: DC | PRN
  Administered 2022-08-18 – 2022-08-20 (×4): 5 mg via ORAL
  Filled 2022-08-18 (×4): qty 1

## 2022-08-18 NOTE — Progress Notes (Signed)
Patient requested lidocaine patch and stronger pain medication for 10/10 pain in lower back (chronic pain), nicotine patch, and medication to help her sleep this evening. This nurse spoke with Clarene Essex, NP for these requests. See MAR for new medications.

## 2022-08-18 NOTE — Progress Notes (Signed)
Initial Nutrition Assessment  DOCUMENTATION CODES:   Not applicable  INTERVENTION:   -Ensure Enlive po BID, each supplement provides 350 kcal and 20 grams of protein -MVI with minerals daily -Liberalize diet to regular for wider variety of meal selections  NUTRITION DIAGNOSIS:   Increased nutrient needs related to chronic illness as evidenced by estimated needs.  GOAL:   Patient will meet greater than or equal to 90% of their needs  MONITOR:   PO intake, Supplement acceptance  REASON FOR ASSESSMENT:   Malnutrition Screening Tool, Consult Assessment of nutrition requirement/status  ASSESSMENT:   Pt with medical history significant of COPD, tobacco abuse, GERD. Presenting with dyspnea. Symptoms started 2 days ago. She had increased productive cough.  Pt admitted with COPD exacerbation.   9/6- s/p BSE- recommend regular diet with thin liquids  Pt unavailable at time of visit. Attempted to speak with pt via call to hospital room phone, however, unable to reach. RD unable to obtain further nutrition-related history or complete nutrition-focused physical exam at this time.     Pt currently on a heart healthy diet. No meal completion data available at this time.   Reviewed wt hx; pt has experienced a 12.7% wt loss over the past 7 months, which is significant for time frame.   Pt with increased nutritional needs for chronic illness and would benefit from addition of oral nutrition supplements.   Medications reviewed and include calcium carbonate, melatonin, and prednisone.   Labs reviewed: CBGS: 90.   Diet Order:   Diet Order             Diet Heart Room service appropriate? Yes; Fluid consistency: Thin  Diet effective now                   EDUCATION NEEDS:   No education needs have been identified at this time  Skin:  Skin Assessment: Reviewed RN Assessment  Last BM:  Unknown  Height:   Ht Readings from Last 1 Encounters:  08/17/22 '5\' 2"'$  (1.575 m)     Weight:   Wt Readings from Last 1 Encounters:  08/17/22 62.6 kg    Ideal Body Weight:  50 kg  BMI:  Body mass index is 25.24 kg/m.  Estimated Nutritional Needs:   Kcal:  1850-2050  Protein:  90-105 grams  Fluid:  > 1.8 L    Loistine Chance, RD, LDN, [place] Registered Dietitian II Certified Diabetes Care and Education Specialist Please refer to [facility] for RD and/or RD on-call/weekend/after hours pager

## 2022-08-18 NOTE — Evaluation (Signed)
Clinical/Bedside Swallow Evaluation Patient Details  Name: Kathleen [name] MRN: [id number] Date of Birth: Sep 11, 1961  Today's Date: 08/18/2022 Time: SLP Start Time (ACUTE ONLY): 13 SLP Stop Time (ACUTE ONLY): 1500 SLP Time Calculation (min) (ACUTE ONLY): 45 min  Past Medical History:  Past Medical History:  Diagnosis Date   Anxiety    Arthritis    hands   Asthma    2 sets of PFT's in 04/09 without sign variability. Last set with significant decrease in FEV1 with saline alone, suggesting Asthma but  recommended clinical corelation    Asthma    Bipolar 1 disorder [facility])    therapist is Joy and is followed by [facility]   Blackout    negative work up including ESR, ANA, opthalmology referral, carotid dopplers, 2D echo, MRI and EEG.   BREAST LUMP 03/25/2008   Annotation: bilaterally Qualifier: Diagnosis of  By: Tomasa Hosteller MD, Edmon Crape.    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 Further work up includes CRP, ESR, SPEP/UPEP, hepatitis erology, A1C , repeat ANA   COPD (chronic obstructive pulmonary disease) ([place])    COPD exacerbation ([place]) 03/31/2019   Depression    DUB (dysfunctional uterine bleeding)    and pelvic pain, with negative endometrial bx in 07/09 and transvaginal U/S significant for mild fibroids in 0/09.   Emphysema of lung ([place])    GERD (gastroesophageal reflux disease)    Hallucinations 03/18/2008   Qualifier: Diagnosis of  By: Redmond Pulling  MD, Mateo Flow     Hyperlipidemia    Lower extremity edema    Neg ABI's, normal 2D echo, normal albumin   Menorrhagia    Ovarian cyst    Polysubstance abuse ([place])    none since March 17,2009.   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 PMH: anxiety, arthritis, asthma, BiPolar1, COPD, tobacco abuse, GERD, marijuana use, breast mass c/w fibroadenoma. bronchitis, chronic headache and low back pain, depression, emphysema, hallucinations, HLD, polysubstance abuse, OSA, heat stroke (2020), recurrent sialolithiasis. CXR = no acute pulmonary findings    Assessment / Plan / Recommendation  Clinical Impression  Pt seen at bedside to assess swallow function and safety. Pt was sleeping upon arrival of SLP, but awakened easily with verbal and tactile stim. Pt was most pleasant and cooperative during this assessment. She reports recurrent "neck stones" (sialolithiasis), most recently removed by Dr. Colin Benton in [place], April 2023. Pt presents with upper dentures only. CN exam unremarkable. Pt reports no difficulty swallowing unless these stones have returned. She  reports that the difficulty is highly variable, as is the discomfort and pain. Pt indicates sometimes she cannot swallow at all, and needs to spit out whatever is in her mouth.  Other times, she is able to swallow without issue.   Today, pt accepted trials of ice chips, thin liquid, nectar thick liquid, puree, and solid textures. Ice chips, nectar thick liquids, puree, and solid textures were tolerated well without overt s/s aspiration. Intermittent cough response noted after thin liquid trials. Cup sips, straw sips, chin tuck, individual sips, and consecutive boluses were tested, and tolerance of thin liquids continued to be variable. Pt was encouraged to change to nectar  thick liquids, however, she declined to have her liquids thickened. Will continue regular diet and thin liquids at this time, and plan to complete instrumental study via Modified Barium Swallow (MBS) tomorrow, to assess swallow function and safety with stones present, as they recur almost every 3-4 months.  Also recommend consideration of an ENT consult to facilitate examination and removal of stones to maximize pt ability to meet nutrition/hydration needs PO, and to reduce/eliminate her pain.   SLP Visit Diagnosis: Dysphagia, unspecified (R13.10)    Aspiration Risk  Moderate aspiration risk;Risk for inadequate nutrition/hydration    Diet Recommendation Regular;Thin liquid   Liquid Administration via: Cup;Straw Medication Administration: Other (Comment) (as tolerated) Compensations: Slow rate;Small sips/bites Postural Changes: Seated upright at 90 degrees    Other  Recommendations Recommended Consults: Consider ENT evaluation Oral Care Recommendations: Oral care BID    Recommendations for follow up therapy are one component of a multi-disciplinary discharge planning process, led by the attending physician.  Recommendations may be updated based on patient status, additional functional criteria and insurance authorization.  Follow up Recommendations Other (comment) (TBD)      Assistance Recommended at Discharge  TBD  Functional Status Assessment Patient has had a recent decline in their functional status and demonstrates the ability to make significant improvements in function in a reasonable and predictable amount of time.   Frequency and Duration  (pending MBS results/recommendations).         Prognosis Prognosis for Safe Diet Advancement: Good      Swallow Study   General Date of Onset: 08/17/22 HPI: 61yo female admitted 08/17/22 with asthma. PMH: anxiety, arthritis, asthma, BiPolar1, COPD, tobacco abuse, GERD, marijuana use, breast mass c/w fibroadenoma. bronchitis, chronic  headache and low back pain, depression, emphysema, hallucinations, HLD, polysubstance abuse, OSA, heat stroke (2020), recurrent sialolithiasis. CXR = no acute pulmonary findings Type of Study: Bedside Swallow Evaluation Previous Swallow Assessment: BSE 06/2019 - mild oral, WFL pharyngeal. Esophagram recommended Diet Prior to this Study: Regular;Thin liquids Temperature Spikes Noted: No Respiratory Status: Nasal cannula History of Recent Intubation: No Behavior/Cognition: Alert;Cooperative;Pleasant mood Oral Cavity Assessment: Within Functional Limits Oral Care Completed by SLP: No Oral Cavity - Dentition: Dentures, top Vision: Functional for self-feeding Self-Feeding Abilities: Able to feed self Patient Positioning: Upright in bed Baseline Vocal Quality: Normal Volitional Cough: Strong;Congested Volitional Swallow: Able to elicit    Oral/Motor/Sensory Function Overall Oral Motor/Sensory Function: Within functional limits   Ice Chips Ice chips: Within functional limits Presentation: Spoon   Thin Liquid Thin Liquid:  (inconsistent cough response after thin liquids via cup, straw, chin tuck, and individual vs large/consecutive boluses.) Presentation: Cup;Spoon;Straw;Self Fed    Nectar Thick Nectar Thick Liquid: Within functional limits Presentation: Straw   Honey Thick Honey Thick Liquid: Not tested   Puree Puree: Within functional limits Presentation: Spoon;Self Fed   Solid  Solid: Within functional limits Presentation: [place], [facility], [place] Speech Language Pathologist Office: [phone]  Shonna Chock 08/18/2022,3:12 PM

## 2022-08-18 NOTE — Evaluation (Signed)
Physical Therapy Evaluation Patient Details Name: Kathleen [name] MRN: [id number] DOB: Jun 14, 1961 Today's Date: 08/18/2022  History of Present Illness  Pt is 61 yo female admitted on 08/17/22 with dyspnea - COPD exacerbation.  Pt with ongoing tobacco and marijuana abuse.  She has hx including but not limited to COPD, GERD, anxiety, arthritis, emphysema, sleep apnea  Clinical Impression   Pt admitted with above diagnosis.  At baseline she is independent but has been having difficulty breathing the last month. Pt has been mobilizing in room independently and demonstrated safely.  She ambulated in hallway at normal speed with supervision on RA and stable O2 sats but did have some wheezing and shortness of breath.  Pt with good strength and balance.  Educated on rollator use if needed for rest breaks, gradual increase in endurance. Does not need further skilled PT services.    Recommendations for follow up therapy are one component of a multi-disciplinary discharge planning process, led by the attending physician.  Recommendations may be updated based on patient status, additional functional criteria and insurance authorization.  Follow Up Recommendations No PT follow up      Assistance Recommended at Discharge Intermittent Supervision/Assistance  Patient can return home with the following  Help with stairs or ramp for entrance;Assistance with cooking/housework    Equipment Recommendations None recommended by PT  Recommendations for Other Services       Functional Status Assessment Patient has not had a recent decline in their functional status     Precautions / Restrictions Precautions Precautions: None      Mobility  Bed Mobility Overal bed mobility: Independent                  Transfers Overall transfer level: Independent Equipment used: None               General transfer comment: Pt has been ambulating in room independently; demonstrated safely     Ambulation/Gait Ambulation/Gait assistance: Supervision Gait Distance (Feet): 250 Feet Assistive device: None Gait Pattern/deviations: Step-through pattern, WFL(Within Functional Limits)       General Gait Details: Slight decrease in speed for energy conservation  Stairs            Wheelchair Mobility    Modified Rankin (Stroke Patients Only)       Balance Overall balance assessment: Needs assistance Sitting-balance support: No upper extremity supported Sitting balance-Leahy Scale: Good     Standing balance support: No upper extremity supported Standing balance-Leahy Scale: Good                               Pertinent Vitals/Pain Pain Assessment Pain Assessment: No/denies pain    Home Living Family/patient expects to be discharged to:: Private residence Living Arrangements: Children Available Help at Discharge: Family;Available 24 hours/day Type of Home: House Home Access: Ramped entrance       Home Layout: One level Home Equipment: Grab bars - tub/shower;Shower Land (2 wheels);Cane - single point;BSC/3in1;Rollator (4 wheels) Additional Comments: nebulizer but no O2    Prior Function Prior Level of Function : Independent/Modified Independent;Driving             Mobility Comments: community ambulation no AD; does report some shortness of breath last month ADLs Comments: Independent ADLs and IADLs     Hand Dominance        Extremity/Trunk Assessment   Upper Extremity Assessment Upper Extremity Assessment: Overall WFL for tasks assessed  Lower Extremity Assessment Lower Extremity Assessment: Overall WFL for tasks assessed    Cervical / Trunk Assessment Cervical / Trunk Assessment: Normal  Communication   Communication: No difficulties  Cognition Arousal/Alertness: Awake/alert Behavior During Therapy: WFL for tasks assessed/performed Overall Cognitive Status: Within Functional Limits for tasks assessed                                           General Comments General comments (skin integrity, edema, etc.): Pt on 2 L O2 with sats 98% tried RA and maintained 97% rest and 96% ambulation.  Pt did have DOE of 2/4 with ambulation.  Educated on no acute PT needs. Discussed gradual increase in endurance and use of rollator for rest breaks as needed.    Exercises     Assessment/Plan    PT Assessment Patient does not need any further PT services  PT Problem List         PT Treatment Interventions      PT Goals (Current goals can be found in the Care Plan section)  Acute Rehab PT Goals Patient Stated Goal: return home; breath better PT Goal Formulation: All assessment and education complete, DC therapy    Frequency       Co-evaluation               AM-PAC PT "6 Clicks" Mobility  Outcome Measure Help needed turning from your back to your side while in a flat bed without using bedrails?: None Help needed moving from lying on your back to sitting on the side of a flat bed without using bedrails?: None Help needed moving to and from a bed to a chair (including a wheelchair)?: None Help needed standing up from a chair using your arms (e.g., wheelchair or bedside chair)?: None Help needed to walk in hospital room?: None Help needed climbing 3-5 steps with a railing? : A Little 6 Click Score: 23    End of Session Equipment Utilized During Treatment: Oxygen Activity Tolerance: Patient tolerated treatment well Patient left: in bed;with call bell/phone within reach Nurse Communication: Mobility status PT Visit Diagnosis: Other abnormalities of gait and mobility (R26.89)    Time: 2119-4174 PT Time Calculation (min) (ACUTE ONLY): 25 min   Charges:   PT Evaluation $PT Eval Low Complexity: 1 Low PT Treatments $Gait Training: 8-22 mins        Abran Richard, PT Acute Rehab [facility] [phone]   Karlton Lemon 08/18/2022, 1:42 PM

## 2022-08-18 NOTE — TOC Initial Note (Signed)
Transition of Care [facility]) - Initial/Assessment Note    Patient Details  Name: Kathleen [name] MRN: [id number] Date of Birth: 01-12-1961  [facility] CM/SW Contact:    Vassie Moselle, LCSW Phone Number: 08/18/2022, 10:46 AM  Clinical Narrative:                 TOC consulted for medication assistance. Pt has Medicaid and co-pay for medications are $4. No further medication assistance is available as pt has insurance. CSW discussed other financial stressors that cause pt to be unable to get medications. Pt states she struggles to pay her rent and utilities as she only receives $800 in SSI a month to cover all of her expenses. Pt agreeable to having resources for rent and utility assistance added to her discharge paperwork. These resources have been placed on pt's chart.  Pt is interested in home O2 as she struggles to breathe while ambulating. CSW discussed qualifications of home O2. CSW will continue to follow for discharge needs and to determine if pt qualifies for home O2 prior to discharge.   Expected Discharge Plan: Home/Self Care Barriers to Discharge: No Barriers Identified   Patient Goals and CMS Choice Patient states their goals for this hospitalization and ongoing recovery are:: "To go home"   Choice offered to / list presented to : Patient  Expected Discharge Plan and Services Expected Discharge Plan: Home/Self Care In-house Referral: NA Discharge Planning Services: CM Consult Post Acute Care Choice: Durable Medical Equipment Living arrangements for the past 2 months: Single Family Home                                      Prior Living Arrangements/Services Living arrangements for the past 2 months: Single Family Home Lives with:: Self Patient language and need for interpreter reviewed:: Yes Do you feel safe going back to the place where you live?: Yes      Need for Family Participation in Patient Care: No (Comment) Care giver support system in  place?: No (comment)   Criminal Activity/Legal Involvement Pertinent to Current Situation/Hospitalization: No - Comment as needed  Activities of Daily Living Home Assistive Devices/Equipment: Nebulizer ADL Screening (condition at time of admission) Patient's cognitive ability adequate to safely complete daily activities?: Yes Is the patient deaf or have difficulty hearing?: No Does the patient have difficulty seeing, even when wearing glasses/contacts?: Yes (double vision all the time per pt) Does the patient have difficulty concentrating, remembering, or making decisions?: No Patient able to express need for assistance with ADLs?: Yes Does the patient have difficulty dressing or bathing?: No Independently performs ADLs?: Yes (appropriate for developmental age) Does the patient have difficulty walking or climbing stairs?: Yes (sob) Weakness of Legs: Both Weakness of Arms/Hands: Both  Permission Sought/Granted   Permission granted to share information with : No              Emotional Assessment Appearance:: Appears stated age Attitude/Demeanor/Rapport: Engaged Affect (typically observed): Accepting Orientation: : Oriented to Self, Oriented to Place, Oriented to  Time, Oriented to Situation Alcohol / Substance Use: Not Applicable Psych Involvement: No (comment)  Admission diagnosis:  COPD exacerbation (HCC) [J44.1] Exacerbation of intermittent asthma, unspecified asthma severity [J45.21] Patient Active Problem List   Diagnosis Date Noted   COPD with acute exacerbation ([place]) 08/17/2022   Marijuana abuse 08/17/2022   Hypokalemia 08/17/2022   Hypocalcemia 08/17/2022  Weight loss 07/07/2022   Submandibular sialodocholithiasis 11/18/2021   COVID-19 virus infection 10/21/2021   Skin growth 08/28/2021   Dysuria 08/28/2021   Lumbar back pain 05/28/2021   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   Daytime somnolence 04/06/2019   Lumbar radiculopathy 07/26/2016   Depression 09/19/2015    Preventative health care 06/18/2014   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   Tobacco abuse 03/13/2013   GERD 01/21/2010   Sacral back pain 11/27/2008   Asthma exacerbation 08/08/2008   Generalized anxiety disorder 03/14/2008   Migraine variant 03/14/2008   PCP:  Riesa Pope, MD Pharmacy:   [place], [place] - [address] [place] [address] Phone: [phone] Fax: [phone]     Social Determinants of Health (SDOH) Interventions    Readmission Risk Interventions    08/18/2022   10:44 AM  Readmission Risk Prevention Plan  Transportation Screening Complete  PCP or Specialist Appt within 5-7 Days Complete  Home Care Screening Complete  Medication Review (RN CM) Complete

## 2022-08-18 NOTE — Progress Notes (Signed)
Patient requests a nebulizer treatment at this time for chest congestion. Currently on 2LNC, O2 sat at 99%, patient has increased expiratory wheezing and rhonchi bilaterally as well as increased coughing. Encouraged cough and deep breathing. This nurse reached out to J. Olena Heckle for PRN nebulizer as scheduled nebulizer is not until 0800.   See MAR for new PRN medication administration.

## 2022-08-18 NOTE — Progress Notes (Signed)
PROGRESS NOTE    Kathleen SERMENO  OHY:[id number] DOB: 1961-05-27 DOA: 08/17/2022 PCP: Riesa Pope, MD    Chief Complaint  Patient presents with   Asthma    Brief Narrative: Kathleen [name] is a 61 y.o. female with medical history significant of COPD, tobacco abuse, GERD. Presenting with dyspnea. Symptoms started 2 days ago. She had increased productive cough. She didn't have any fever or sick contacts. She tried her inhalers/nebs, but they didn't help. She also had increased urinary frequency during this time and some burning w/ urination. CXR showing mild hyperinflation without any pulm findings.    Assessment & Plan:   Active Problems:   GERD   Tobacco abuse   Dysuria   COPD with acute exacerbation (HCC)   Marijuana abuse   Hypokalemia   Hypocalcemia   Acute COPD exacerbation requiring about 2 lit of [place] oxygen and with bilateral exp wheezing, she also reports to smoking.  Recommend to continue with steroids. [place] oxygen to keep sats greater than 90%.  Check ambulating oxygen levels.  Continue with Robitussin and bronchodilators .     Hypokalemia: replaced and repeat level wnl.    Dysuria:  UA shows large leukocytes and rare bacteria. Get urine cultures. Rocephin ordered.    Tobacco abuse and marijuana use:  Counseled to quit.   GERD  On PPI. Will increase to BID.    Choking episodes:  SLP eval requested.    DVT prophylaxis: Lovenox Code Status: full code.  Family Communication: none at bedside.  Disposition:   Status is: Inpatient Remains inpatient appropriate because: copd exacerbation.    Level of care: Telemetry Consultants:  None.   Procedures: none.   Antimicrobials:   Subjective: REPORTS HAVING TROUBLE WITH TAKING MEDS.  Has chocking episodes with potassium pills.  Has back pain, requesting her pain meds to be continued.  No appetite.   Objective: Vitals:   08/18/22 0055 08/18/22 0436 08/18/22 0500 08/18/22 0700  BP:  126/87     Pulse:  94    Resp:  16 (!) 35 19  Temp:  98.1 F (36.7 C)    TempSrc:  Oral    SpO2: 98% 99%    Weight:      Height:       No intake or output data in the 24 hours ending 08/18/22 1430 Filed Weights   08/17/22 0857  Weight: 62.6 kg    Examination:  General exam: Appears calm and comfortable  Respiratory system: bilateral expiratory wheezing heard. On 2 lit OF  oxygen.  Cardiovascular system: S1 & S2 heard, RRR. No JVD, murmurs,No pedal edema. Gastrointestinal system: Abdomen is nondistended, soft and nontender.  Normal bowel sounds heard. Central nervous system: Alert and oriented. No focal neurological deficits. Extremities: Symmetric 5 x 5 power. Skin: No rashes, lesions or ulcers Psychiatry: . Mood & affect appropriate.     Data Reviewed: I have personally reviewed following labs and imaging studies  CBC: Recent Labs  Lab 08/17/22 0957 08/18/22 0540  WBC 8.5 14.9*  NEUTROABS 6.4  --   HGB 15.2* 15.2*  HCT 47.2* 47.3*  MCV 87.9 87.6  PLT 318 948    Basic Metabolic Panel: Recent Labs  Lab 08/17/22 1219 08/17/22 1757 08/18/22 0540  NA 144  --  139  K 3.3*  --  3.8  CL 112*  --  105  CO2 26  --  27  GLUCOSE 103*  --  113*  BUN 9  --  14  CREATININE 1.05*  --  0.91  CALCIUM 8.0*  --  9.2  MG  --  2.2  --     GFR: Estimated Creatinine Clearance: 56.5 mL/min (by C-G formula based on SCr of 0.91 mg/dL).  Liver Function Tests: Recent Labs  Lab 08/17/22 1219 08/18/22 0540  AST 23 21  ALT 22 25  ALKPHOS 53 64  BILITOT 0.4 0.6  PROT 5.8* 6.9  ALBUMIN 3.3* 3.6    CBG: No results for input(s): "GLUCAP" in the last 168 hours.   Recent Results (from the past 240 hour(s))  SARS Coronavirus 2 by RT PCR (hospital order, performed in [facility] hospital lab) *cepheid single result test* Anterior Nasal Swab     Status: None   Collection Time: 08/17/22  2:52 PM   Specimen: Anterior Nasal Swab  Result Value Ref Range Status   SARS Coronavirus 2  by RT PCR NEGATIVE NEGATIVE Final    Comment: (NOTE) SARS-CoV-2 target nucleic acids are NOT DETECTED.  The SARS-CoV-2 RNA is generally detectable in upper and lower respiratory specimens during the acute phase of infection. The lowest concentration of SARS-CoV-2 viral copies this assay can detect is 250 copies / mL. A negative result does not preclude SARS-CoV-2 infection and should not be used as the sole basis for treatment or other patient management decisions.  A negative result may occur with improper specimen collection / handling, submission of specimen other than nasopharyngeal swab, presence of viral mutation(s) within the areas targeted by this assay, and inadequate number of viral copies (<250 copies / mL). A negative result must be combined with clinical observations, patient history, and epidemiological information.  Fact Sheet for Patients:   https://www.patel.info/  Fact Sheet for Healthcare Providers: https://hall.com/  This test is not yet approved or  cleared by the [place] FDA and has been authorized for detection and/or diagnosis of SARS-CoV-2 by FDA under an Emergency Use Authorization (EUA).  This EUA will remain in effect (meaning this test can be used) for the duration of the COVID-19 declaration under Section 564(b)(1) of the Act, 21 U.S.C. section 360bbb-3(b)(1), unless the authorization is terminated or revoked sooner.  Performed at [facility], [place] [address], [place], [place] [address]          Radiology Studies: [facility] Chest Port 1 View  Result Date: 08/17/2022 CLINICAL DATA:  Shortness of breath.  History of asthma. EXAM: PORTABLE CHEST 1 VIEW COMPARISON:  Multiple previous chest x-rays. The most recent is 05/22/2022 FINDINGS: The cardiac silhouette, mediastinal and hilar contours are within normal limits and stable. The lungs are clear of an acute process. Mild hyperinflation  noted. Rounded density in the left lower lobe is consistent with a left nipple shadow. IMPRESSION: Mild hyperinflation but no acute pulmonary findings. Electronically Signed   By: Marijo Sanes M.D.   On: 08/17/2022 09:42        Scheduled Meds:  calcium carbonate  1 tablet Oral TID WC   enoxaparin (LOVENOX) injection  40 mg Subcutaneous Q24H   feeding supplement  237 mL Oral BID BM   loratadine  10 mg Oral Daily   melatonin  3 mg Oral QHS   montelukast  10 mg Oral QHS   nicotine  7 mg Transdermal Daily   pantoprazole  40 mg Oral Daily   [START ON 08/19/2022] predniSONE  40 mg Oral Q breakfast   Continuous Infusions:   LOS: 1 day    Time spent: 45 minutes.  Hosie Poisson, MD [facility]   To contact the attending provider between 7A-7P or the covering provider during after hours 7P-7A, please log into the web site [email or web address] and access using universal  password for that web site. If you do not have the password, please call the hospital operator.  08/18/2022, 2:30 PM

## 2022-08-19 ENCOUNTER — Inpatient Hospital Stay (HOSPITAL_COMMUNITY): Payer: Medicaid Other

## 2022-08-19 DIAGNOSIS — K219 Gastro-esophageal reflux disease without esophagitis: Secondary | ICD-10-CM | POA: Diagnosis not present

## 2022-08-19 DIAGNOSIS — R300 Dysuria: Secondary | ICD-10-CM | POA: Diagnosis not present

## 2022-08-19 DIAGNOSIS — E876 Hypokalemia: Secondary | ICD-10-CM | POA: Diagnosis not present

## 2022-08-19 DIAGNOSIS — J441 Chronic obstructive pulmonary disease with (acute) exacerbation: Secondary | ICD-10-CM | POA: Diagnosis not present

## 2022-08-19 LAB — URINE CULTURE: Culture: 80000 — AB

## 2022-08-19 MED ORDER — NICOTINE 14 MG/24HR TD PT24
14.0000 mg | MEDICATED_PATCH | Freq: Every day | TRANSDERMAL | Status: DC
  Administered 2022-08-19 – 2022-08-20 (×2): 14 mg via TRANSDERMAL
  Filled 2022-08-19 (×2): qty 1

## 2022-08-19 MED ORDER — LIDOCAINE 5 % EX PTCH
1.0000 | MEDICATED_PATCH | Freq: Every day | CUTANEOUS | Status: DC
  Administered 2022-08-19 – 2022-08-20 (×2): 1 via TRANSDERMAL
  Filled 2022-08-19 (×2): qty 1

## 2022-08-19 NOTE — Progress Notes (Addendum)
Modified Barium Swallow Progress Note  Patient Details  Name: Kathleen [name] MRN: [id number] Date of Birth: Apr 17, 1961  Today's Date: 08/19/2022  Modified Barium Swallow completed.  Full report located under Chart Review in the Imaging Section.  Brief recommendations include the following:  Clinical Impression  Patient presents with intact oropharyngeal swallow without aspiration nor penetration of all consistenies tested. She piecemeals across boluses, suspect compensatory.  Swallow is timely and strong without pharyngeal retention. Pt did sense pharyngeal retention during testing - when pharynx was clear.  She did not transit tablet into pharynx stating she has a hard time swallowing pills and reports they are crushed.  Upon esophageal sweep, pt appeared with retention and backflow *known esophageal deficits.  She sensed pharynx had cleared when she maintained retention in esophagus, thus referent sensation.    Following testing, pt given water to drink - which she swallowed and immediately expectorated.  Likely refluxed into pharynx - larynx.  Pt endorses slightly thicker liquids being easier to swallow - likley due to increased viscocity slowing flow of overt reflux.  Recommend maximize liquid nutrition - especially as she reports weight loss due to her dysphagia.  Using flouro loops, live study, educated pt to findings/recommendations - to which she advised understanding.    Recommend pt follow up with her ENT at [facility] re: her submandibular calculi.  Swallow Evaluation Recommendations       SLP Diet Recommendations: Other (Comment) (as tolerated)       Medication Administration: Other (Comment) (crush per pt request)       Compensations: Slow rate;Small sips/bites (small frequent meals)   Postural Changes: Remain semi-upright after after feeds/meals (Comment);Seated upright at 90 degrees   Oral Care Recommendations: Oral care BID        Macario Golds 08/19/2022,9:11 AM

## 2022-08-19 NOTE — Progress Notes (Signed)
PROGRESS NOTE    Kathleen [name]  GBT:[id number] DOB: 04/03/1961 DOA: 08/17/2022 PCP: Riesa Pope, MD    Chief Complaint  Patient presents with   Asthma    Brief Narrative: Kathleen [name] is a 61 y.o. female with medical history significant of COPD, tobacco abuse, GERD. Presenting with dyspnea. Symptoms started 2 days ago. She had increased productive cough. She didn't have any fever or sick contacts. She tried her inhalers/nebs, but they didn't help. She also had increased urinary frequency during this time and some burning w/ urination. CXR showing mild hyperinflation without any pulm findings.    Assessment & Plan:   Active Problems:   GERD   Tobacco abuse   Dysuria   COPD with acute exacerbation (HCC)   Marijuana abuse   Hypokalemia   Hypocalcemia   Acute COPD exacerbation requiring about 2 lit of [place] oxygen Recommend to continue with steroids. [place] oxygen to keep sats greater than 90%.   Check ambulating oxygen levels.  Wheezing has improved. But she reports that she required oxygen today as her sats have been in 70% Continue with Robitussin and bronchodilators .    Leukocytosis:  - probably from steroids.    Hypokalemia: replaced and repeat level wnl.    Dysuria:  UA shows large leukocytes and rare bacteria. Urine cultures showing lactobacillus.  Rocephin ordered. D/c after tomorrow dose.    Tobacco abuse and marijuana use:  Counseled to quit.   GERD  On PPI. Will increase to BID.    Choking episodes:  SLP eval requested.  Recommend outpatient follow up with ENT .  Recommend maximize liquid nutrition. Remain semi-upright after after feeds/meals (Comment);Seated upright at 90 degrees   DVT prophylaxis: Lovenox Code Status: full code.  Family Communication: none at bedside.  Disposition:   Status is: Inpatient Remains inpatient appropriate because: copd exacerbation.    Level of care: Telemetry Consultants:  None.   Procedures: none.    Antimicrobials:   Subjective: Reports feeling better,  Objective: Vitals:   08/19/22 0619 08/19/22 0757 08/19/22 0800 08/19/22 1131  BP: 124/78     Pulse: 71     Resp: 18     Temp: 98.8 F (37.1 C)     TempSrc: Oral     SpO2: 93% 97% 97% 94%  Weight:      Height:        Intake/Output Summary (Last 24 hours) at 08/19/2022 1451 Last data filed at 08/19/2022 1426 Gross per 24 hour  Intake 430 ml  Output --  Net 430 ml   Filed Weights   08/17/22 0857  Weight: 62.6 kg    Examination:  General exam: Appears calm and comfortable  Respiratory system: wheezing has improved. But not resolved yet.  Cardiovascular system: S1 & S2 heard, RRR. No JVD,  No pedal edema. Gastrointestinal system: Abdomen is nondistended, soft and nontender. No organomegaly or masses felt. Normal bowel sounds heard. Central nervous system: Alert and oriented. No focal neurological deficits. Extremities: Symmetric 5 x 5 power. Skin: No rashes, lesions or ulcers Psychiatry:  Mood & affect appropriate.      Data Reviewed: I have personally reviewed following labs and imaging studies  CBC: Recent Labs  Lab 08/17/22 0957 08/18/22 0540  WBC 8.5 14.9*  NEUTROABS 6.4  --   HGB 15.2* 15.2*  HCT 47.2* 47.3*  MCV 87.9 87.6  PLT 318 331     Basic Metabolic Panel: Recent Labs  Lab 08/17/22 1219 08/17/22 1757 08/18/22 0540  NA 144  --  139  K 3.3*  --  3.8  CL 112*  --  105  CO2 26  --  27  GLUCOSE 103*  --  113*  BUN 9  --  14  CREATININE 1.05*  --  0.91  CALCIUM 8.0*  --  9.2  MG  --  2.2  --      GFR: Estimated Creatinine Clearance: 56.5 mL/min (by C-G formula based on SCr of 0.91 mg/dL).  Liver Function Tests: Recent Labs  Lab 08/17/22 1219 08/18/22 0540  AST 23 21  ALT 22 25  ALKPHOS 53 64  BILITOT 0.4 0.6  PROT 5.8* 6.9  ALBUMIN 3.3* 3.6     CBG: No results for input(s): "GLUCAP" in the last 168 hours.   Recent Results (from the past 240 hour(s))  SARS  Coronavirus 2 by RT PCR (hospital order, performed in [facility] hospital lab) *cepheid single result test* Anterior Nasal Swab     Status: None   Collection Time: 08/17/22  2:52 PM   Specimen: Anterior Nasal Swab  Result Value Ref Range Status   SARS Coronavirus 2 by RT PCR NEGATIVE NEGATIVE Final    Comment: (NOTE) SARS-CoV-2 target nucleic acids are NOT DETECTED.  The SARS-CoV-2 RNA is generally detectable in upper and lower respiratory specimens during the acute phase of infection. The lowest concentration of SARS-CoV-2 viral copies this assay can detect is 250 copies / mL. A negative result does not preclude SARS-CoV-2 infection and should not be used as the sole basis for treatment or other patient management decisions.  A negative result may occur with improper specimen collection / handling, submission of specimen other than nasopharyngeal swab, presence of viral mutation(s) within the areas targeted by this assay, and inadequate number of viral copies (<250 copies / mL). A negative result must be combined with clinical observations, patient history, and epidemiological information.  Fact Sheet for Patients:   https://www.patel.info/  Fact Sheet for Healthcare Providers: https://hall.com/  This test is not yet approved or  cleared by the [place] FDA and has been authorized for detection and/or diagnosis of SARS-CoV-2 by FDA under an Emergency Use Authorization (EUA).  This EUA will remain in effect (meaning this test can be used) for the duration of the COVID-19 declaration under Section 564(b)(1) of the Act, 21 U.S.C. section 360bbb-3(b)(1), unless the authorization is terminated or revoked sooner.  Performed at [facility], [place] [address] [place], [place] [address]   Urine Culture     Status: Abnormal   Collection Time: 08/17/22  6:49 PM   Specimen: Urine, Clean Catch  Result Value Ref Range Status    Specimen Description   Final    URINE, CLEAN CATCH Performed at [facility], [place] [address], [place], [place] [address]    Special Requests   Final    NONE Performed at [facility], [place] [address] [place], [place] [address]    Culture (A)  Final    80,000 COLONIES/mL LACTOBACILLUS SPECIES Standardized susceptibility testing for this organism is not available. Performed at [facility], [place] [address] [place], [place] [address]    Report Status 08/19/2022 FINAL  Final         Radiology Studies: DG Swallowing Func-Speech Pathology  Result Date: 08/19/2022 Table formatting from the original result was not included. Objective Swallowing Evaluation: Type of Study: MBS-Modified Barium Swallow Study  Patient Details Name: GALILEA QUITO MRN:  [id number] Date of Birth: February 23, 1961 Today's Date: 08/19/2022 Time: SLP Start Time (ACUTE ONLY): 8675 -SLP Stop Time (ACUTE ONLY): 0850 SLP Time Calculation (min) (ACUTE ONLY): 15 min Past Medical History: Past Medical History: Diagnosis Date  Anxiety   Arthritis   hands  Asthma   2 sets of PFT's in 04/09 without sign variability. Last set with significant decrease in FEV1 with saline alone, suggesting Asthma but  recommended clinical corelation   Asthma   Bipolar 1 disorder [facility])   therapist is Joy and is followed by [facility]  Blackout   negative work up including ESR, ANA, opthalmology referral, carotid dopplers, 2D echo, MRI and EEG.  BREAST LUMP 03/25/2008  Annotation: bilaterally Qualifier: Diagnosis of  By: Tomasa Hosteller MD, Edmon Crape.   Breast mass in female   s/p mammogram, u/s and biopsy in 05/09 c/w fibroadenoma,  Bronchitis   Chronic headache   Chronic low back pain 08/08/2008  Qualifier: Diagnosis of  By: Redmond Pulling  MD, Valerie    Chronic pain   normal work up including TSH, RPR, B12, HIV, plain films, 2 ESR's, ANA, CK, RF along with routine CBC, CMET and UA. Further work up includes CRP, ESR,  SPEP/UPEP, hepatitis erology, A1C , repeat ANA  COPD (chronic obstructive pulmonary disease) ([place])   COPD exacerbation ([place]) 03/31/2019  Depression   DUB (dysfunctional uterine bleeding)   and pelvic pain, with negative endometrial bx in 07/09 and transvaginal U/S significant for mild fibroids in 0/09.  Emphysema of lung ([place])   GERD (gastroesophageal reflux disease)   Hallucinations 03/18/2008  Qualifier: Diagnosis of  By: Redmond Pulling  MD, Mateo Flow    Hyperlipidemia   Lower extremity edema   Neg ABI's, normal 2D echo, normal albumin  Menorrhagia   Ovarian cyst   Polysubstance abuse ([place])   none since March 17,2009.  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 PMH: anxiety, arthritis, asthma, BiPolar1, COPD, tobacco abuse, GERD, marijuana use, breast mass c/w fibroadenoma. bronchitis, chronic headache and low back pain, depression, emphysema, hallucinations, HLD, polysubstance abuse, OSA, heat stroke (2020), recurrent sialolithiasis. CXR = no acute pulmonary findings.  07/14/2019 Esophageal dysmotility with esophageal stasis and mild reflux as  above. Pt with h/o left submandibular gland calculus.  Subjective: pt awake in chair  Recommendations for follow up therapy are one component of a multi-disciplinary discharge planning process, led by the attending physician.  Recommendations may be updated based on patient status, additional functional criteria and insurance authorization. Assessment / Plan / Recommendation   08/19/2022   9:04 AM Clinical Impressions Clinical Impression Patient presents with intact oropharyngeal swallow without aspiration nor penetration  of all consistenies tested. She piecemeals across boluses, suspect compensatory.  Swallow is timely and strong without pharyngeal retention. Pt did sense pharyngeal retention during testing - when pharynx was clear.  She did not transit tablet into pharynx stating she has a hard time swallowing pills and reports they are crushed.  No impact of her submandibular stones on her swallow function observed.  Upon esophageal sweep, pt appeared  with retention and backflow *known esophageal deficits.  She sensed pharynx had cleared when she maintained retention in esophagus, thus referent sensation.  Following testing, pt given water to drink - which she swallowed and immediately expectorated.  Likely refluxed into pharynx - larynx.  Pt endorses slightly thicker liquids being easier to swallow - likley due to increased viscocity slowing flow of overt reflux.  Recommend maximize liquid nutrition - especially as she reports weight loss due to her dysphagia.  Using flouro loops, live study, educated pt to findings/recommendations - to which she advised understanding.  Swallow Evaluation SLP Visit Diagnosis Dysphagia, unspecified (R13.10) Impact on safety and function Mild aspiration risk;Risk for inadequate nutrition/hydration     08/19/2022   9:04 AM Treatment Recommendations Treatment Recommendations No treatment recommended at this time     08/18/2022   3:09 PM Prognosis Prognosis for Safe Diet Advancement Good   08/19/2022   9:04 AM Diet Recommendations SLP Diet Recommendations Other (Comment) Medication Administration Other (Comment) Compensations Slow rate;Small sips/bites Postural Changes Remain semi-upright after after feeds/meals (Comment);Seated upright at 90 degrees     08/19/2022   9:04 AM Other Recommendations Oral Care Recommendations Oral care BID Follow Up Recommendations No SLP follow up Functional Status Assessment Patient has not had a recent decline in their functional status   08/18/2022   3:09 PM Frequency and  Duration  Speech Therapy Frequency (ACUTE ONLY) --     08/19/2022   8:53 AM Oral Phase Oral Phase WFL Oral - Honey Teaspoon NT Oral - Honey Cup NT Oral - Nectar Cup [facility] Oral - Thin Cup WFL Oral - Thin Straw WFL Oral - Puree WFL;Piecemeal swallowing Oral - Mech Soft WFL;Piecemeal swallowing Oral - Pill Other (Comment)    08/19/2022   9:02 AM Pharyngeal Phase Pharyngeal Phase Impaired Pharyngeal- Nectar Cup WFL Pharyngeal- Thin Teaspoon WFL Pharyngeal- Thin Cup WFL Pharyngeal- Thin Straw WFL Pharyngeal- Puree WFL Pharyngeal- Mechanical Soft WFL Pharyngeal- Pill WFL Pharyngeal Comment pt with intact pharyngeal swallow but senses pharyngeal retention -referrant to esophagus    08/19/2022   9:04 AM Cervical Esophageal Phase  Cervical Esophageal Phase WFL Kathleen Lime, MS [facility] SLP Acute Rehab Services Office [phone] Pager [phone] Macario Golds 08/19/2022, 9:12 AM      .                    Scheduled Meds:  calcium carbonate  1 tablet Oral TID WC   enoxaparin (LOVENOX) injection  40 mg Subcutaneous Q24H   feeding supplement  237 mL Oral BID BM   ipratropium-albuterol  3 mL Nebulization Q4H   loratadine  10 mg Oral Daily   mometasone-formoterol  2 puff Inhalation BID   montelukast  10 mg Oral QHS   multivitamin with minerals  1 tablet Oral Daily   nicotine  14 mg Transdermal Daily   pantoprazole  40 mg Oral BID   predniSONE  40 mg Oral Q breakfast   Continuous Infusions:  cefTRIAXone (ROCEPHIN)  IV 1 g (08/19/22 1214)     LOS: 2 days    Time spent: 37 minutes.    Hosie Poisson, MD [facility]   To contact the attending provider between 7A-7P or the covering provider during after hours 7P-7A, please log into the web site [email or web address] and access using universal [place] password for that web site. If you do not have the password, please call the hospital operator.  08/19/2022, 2:51 PM

## 2022-08-19 NOTE — Progress Notes (Signed)
Mobility Specialist Cancellation/Refusal Note:   Reason for Cancellation/Refusal: Pt declined mobility at this time. Pt just took med's, and is feeling lethargic. Will check back as schedule permits.       [facility]

## 2022-08-20 ENCOUNTER — Other Ambulatory Visit (HOSPITAL_COMMUNITY): Payer: Self-pay

## 2022-08-20 DIAGNOSIS — J449 Chronic obstructive pulmonary disease, unspecified: Secondary | ICD-10-CM | POA: Diagnosis not present

## 2022-08-20 DIAGNOSIS — J441 Chronic obstructive pulmonary disease with (acute) exacerbation: Secondary | ICD-10-CM | POA: Diagnosis not present

## 2022-08-20 DIAGNOSIS — K219 Gastro-esophageal reflux disease without esophagitis: Secondary | ICD-10-CM | POA: Diagnosis not present

## 2022-08-20 LAB — PARATHYROID HORMONE, INTACT (NO CA): PTH: 14 pg/mL — ABNORMAL LOW (ref 15–65)

## 2022-08-20 MED ORDER — SPIRIVA HANDIHALER 18 MCG IN CAPS
18.0000 ug | ORAL_CAPSULE | Freq: Every day | RESPIRATORY_TRACT | 12 refills | Status: DC
  Filled 2022-08-20: qty 30, 30d supply, fill #0

## 2022-08-20 MED ORDER — PREDNISONE 20 MG PO TABS
40.0000 mg | ORAL_TABLET | Freq: Every day | ORAL | 0 refills | Status: AC
  Filled 2022-08-20: qty 10, 5d supply, fill #0

## 2022-08-20 MED ORDER — IPRATROPIUM-ALBUTEROL 0.5-2.5 (3) MG/3ML IN SOLN
3.0000 mL | Freq: Four times a day (QID) | RESPIRATORY_TRACT | Status: DC
  Administered 2022-08-20: 3 mL via RESPIRATORY_TRACT
  Filled 2022-08-20: qty 3

## 2022-08-20 MED ORDER — ENSURE ENLIVE PO LIQD
237.0000 mL | Freq: Two times a day (BID) | ORAL | 1 refills | Status: AC
  Filled 2022-08-20: qty 14220, 30d supply, fill #0

## 2022-08-20 MED ORDER — GUAIFENESIN-DM 100-10 MG/5ML PO SYRP
5.0000 mL | ORAL_SOLUTION | ORAL | 0 refills | Status: DC | PRN
  Filled 2022-08-20: qty 118, 4d supply, fill #0

## 2022-08-20 MED ORDER — TRELEGY ELLIPTA 200-62.5-25 MCG/ACT IN AEPB
1.0000 | INHALATION_SPRAY | Freq: Every day | RESPIRATORY_TRACT | 3 refills | Status: DC
  Filled 2022-08-20 – 2022-09-22 (×2): qty 60, 30d supply, fill #0

## 2022-08-20 MED ORDER — IPRATROPIUM-ALBUTEROL 0.5-2.5 (3) MG/3ML IN SOLN
3.0000 mL | Freq: Four times a day (QID) | RESPIRATORY_TRACT | 3 refills | Status: DC | PRN
  Filled 2022-08-20: qty 180, 15d supply, fill #0
  Filled 2022-09-22: qty 360, 30d supply, fill #0

## 2022-08-20 MED ORDER — PANTOPRAZOLE SODIUM 40 MG PO TBEC
40.0000 mg | DELAYED_RELEASE_TABLET | Freq: Every day | ORAL | 1 refills | Status: DC
  Filled 2022-08-20: qty 30, 30d supply, fill #0

## 2022-08-20 MED ORDER — ADULT MULTIVITAMIN W/MINERALS CH: 1.0000 | ORAL_TABLET | Freq: Every day | ORAL | 2 refills | Status: DC

## 2022-08-20 MED ORDER — NICOTINE 14 MG/24HR TD PT24
14.0000 mg | MEDICATED_PATCH | Freq: Every day | TRANSDERMAL | 0 refills | Status: DC
  Filled 2022-08-20 – 2022-09-22 (×2): qty 28, 28d supply, fill #0

## 2022-08-20 NOTE — Progress Notes (Signed)
Mobility Specialist - Progress Note   08/20/22 0947  Mobility  Activity Ambulated with assistance in hallway  Level of Assistance Modified independent, requires aide device or extra time  Assistive Device None  Distance Ambulated (ft) 400 ft  Activity Response Tolerated well  $Mobility charge 1 Mobility   Pt received in bed and agreed to mobility. C/o pain in back, no scale and is typically normal. Pt returned to bed with all needs met.   Roderick Pee Mobility Specialist

## 2022-08-21 NOTE — Discharge Summary (Signed)
Physician Discharge Summary   Patient: Kathleen [name] MRN: [id number] DOB: 11/09/1960  Admit date:     08/17/2022  Discharge date: 08/21/22  Discharge Physician: Hosie Poisson   PCP: Riesa Pope, MD   Recommendations at discharge:  Please follow up with PCP in one week Please follow up with ENT IN ONE WEEK.  Please check cbc and bmp in one week.   Discharge Diagnoses: Active Problems:   GERD   Tobacco abuse   Dysuria   COPD with acute exacerbation (HCC)   Marijuana abuse   Hypokalemia   Hypocalcemia    Hospital Course: Kathleen [name] is a 61 y.o. female with medical history significant of COPD, tobacco abuse, GERD. Presenting with dyspnea. Symptoms started 2 days ago. She had increased productive cough. She didn't have any fever or sick contacts. She tried her inhalers/nebs, but they didn't help. She also had increased urinary frequency during this time and some burning w/ urination. CXR showing mild hyperinflation without any pulm findings.   Assessment and Plan:  Acute COPD exacerbation requiring about 2 lit of [place] oxygen Recommend to continue with steroids on discharge.  [place] oxygen to keep sats greater than 90%.   Check ambulating oxygen levels.  Wheezing has improved. But she reports that she required oxygen today as her sats have been in 70% Continue with Robitussin and bronchodilators .      Leukocytosis:  - probably from steroids.      Hypokalemia: replaced and repeat level wnl.      Dysuria:  UA shows large leukocytes and rare bacteria. Urine cultures showing lactobacillus.  Completed 3 days of rocephin.      Tobacco abuse and marijuana use:  Counseled to quit.    GERD  On PPI. Will increase to BID.      Choking episodes:  SLP eval requested.  Recommend outpatient follow up with ENT .  Recommend maximize liquid nutrition. Remain semi-upright after after feeds/meals (Comment);Seated upright at 90 degrees      Consultants: none.  Procedures  performed: none.   Disposition: Home Diet recommendation:  Discharge Diet Orders (From admission, onward)     Start     Ordered   08/20/22 0000  Diet - low sodium heart healthy        08/20/22 0935           Regular diet DISCHARGE MEDICATION: Allergies as of 08/20/2022       Reactions   Bee Venom    Peach [prunus Persica] Other (See Comments)   Throat swells , breakout   Topamax [topiramate] Other (See Comments)   Hallucinations    Tramadol Nausea Only        Medication List     TAKE these medications    albuterol 108 (90 Base) MCG/ACT inhaler Commonly known as: VENTOLIN HFA Inhale 2 puffs into the lungs every 6 (six) hours as needed for wheezing or shortness of breath.   EPINEPHrine 0.3 mg/0.3 mL Soaj injection Commonly known as: EpiPen 2-Pak Inject 0.3 mg into the muscle as needed for anaphylaxis.   feeding supplement Liqd Take 237 mLs by mouth 2 (two) times daily between meals.   guaiFENesin-dextromethorphan 100-10 MG/5ML syrup Commonly known as: ROBITUSSIN DM Take 5 mLs by mouth every 4 (four) hours as needed for cough.   hydrOXYzine 25 MG tablet Commonly known as: ATARAX Take 1 tablet (25 mg total) by mouth 3 (three) times daily as needed for anxiety.   ipratropium-albuterol 0.5-2.5 (3) MG/3ML Soln  Commonly known as: DUONEB Inhale 3 mLs by nebulization every 6 (six) hours as needed (Wheezing or shortness of breath).   lidocaine 5 % Commonly known as: LIDODERM Place 1 patch onto the skin daily. Remove & Discard patch within 12 hours or as directed by MD   loratadine 10 MG tablet Commonly known as: Claritin Take 1 tablet (10 mg total) by mouth daily.   montelukast 10 MG tablet Commonly known as: SINGULAIR Take 1 tablet (10 mg total) by mouth at bedtime.   multivitamin with minerals Tabs tablet Take 1 tablet by mouth daily.   nicotine 14 mg/24hr patch Commonly known as: NICODERM CQ - dosed in mg/24 hours Place 1 patch (14 mg total) onto the  skin daily.   ondansetron 4 MG tablet Commonly known as: ZOFRAN TAKE 1 TABLET BY MOUTH ONCE DAILY AS NEEDED FOR NAUSEA FOR VOMITING What changed: See the new instructions.   pantoprazole 40 MG tablet Commonly known as: PROTONIX Take 1 tablet (40 mg total) by mouth daily.   predniSONE 20 MG tablet Commonly known as: DELTASONE Take 2 tablets (40 mg total) by mouth daily with breakfast for 5 days.   Spiriva HandiHaler 18 MCG inhalation capsule Generic drug: tiotropium Place 1 capsule (18 mcg total) into inhaler and inhale daily.   Trelegy Ellipta 200-62.5-25 MCG/ACT Aepb Generic drug: Fluticasone-Umeclidin-Vilant Inhale 1 puff into the lungs daily.        Discharge Exam: Filed Weights   08/17/22 0857  Weight: 62.6 kg   General exam: Appears calm and comfortable  Respiratory system: Clear to auscultation. Respiratory effort normal. Cardiovascular system: S1 & S2 heard, RRR. No JVD, murmurs, rubs, gallops or clicks. No pedal edema. Gastrointestinal system: Abdomen is nondistended, soft and nontender. No organomegaly or masses felt. Normal bowel sounds heard. Central nervous system: Alert and oriented. No focal neurological deficits. Extremities: Symmetric 5 x 5 power. Skin: No rashes, lesions or ulcers Psychiatry: Judgement and insight appear normal. Mood & affect appropriate.    Condition at discharge: fair  The results of significant diagnostics from this hospitalization (including imaging, microbiology, ancillary and laboratory) are listed below for reference.   Imaging Studies: DG Swallowing Func-Speech Pathology  Result Date: 08/19/2022 Table formatting from the original result was not included. Objective Swallowing Evaluation: Type of Study: MBS-Modified Barium Swallow Study  Patient Details Name: Kathleen [name] MRN: [id number] Date of Birth: 1961-01-31 Today's Date: 08/19/2022 Time: SLP Start Time (ACUTE ONLY): 8341 -SLP Stop Time (ACUTE ONLY): 0850 SLP Time Calculation  (min) (ACUTE ONLY): 15 min Past Medical History: Past Medical History: Diagnosis Date  Anxiety   Arthritis   hands  Asthma   2 sets of PFT's in 04/09 without sign variability. Last set with significant decrease in FEV1 with saline alone, suggesting Asthma but  recommended clinical corelation   Asthma   Bipolar 1 disorder [facility])   therapist is Joy and is followed by [facility]  Blackout   negative work up including ESR, ANA, opthalmology referral, carotid dopplers, 2D echo, MRI and EEG.  BREAST LUMP 03/25/2008  Annotation: bilaterally Qualifier: Diagnosis of  By: Tomasa Hosteller MD, Edmon Crape.   Breast mass in female   s/p mammogram, u/s and biopsy in 05/09 c/w fibroadenoma,  Bronchitis   Chronic headache   Chronic low back pain 08/08/2008  Qualifier: Diagnosis of  By: Redmond Pulling  MD, Valerie    Chronic pain   normal work up including TSH, RPR, B12, HIV, plain films, 2 ESR's, ANA, CK, RF  along with routine CBC, CMET and UA. Further work up includes CRP, ESR, SPEP/UPEP, hepatitis erology, A1C , repeat ANA  COPD (chronic obstructive pulmonary disease) ([place])   COPD exacerbation ([place]) 03/31/2019  Depression   DUB (dysfunctional uterine bleeding)   and pelvic pain, with negative endometrial bx in 07/09 and transvaginal U/S significant for mild fibroids in 0/09.  Emphysema of lung ([place])   GERD (gastroesophageal reflux disease)   Hallucinations 03/18/2008  Qualifier: Diagnosis of  By: Redmond Pulling  MD, Mateo Flow    Hyperlipidemia   Lower extremity edema   Neg ABI's, normal 2D echo, normal albumin  Menorrhagia   Ovarian cyst   Polysubstance abuse ([place])   none since March 17,2009.  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 PMH: anxiety, arthritis, asthma, BiPolar1, COPD, tobacco abuse, GERD, marijuana use, breast mass c/w fibroadenoma. bronchitis, chronic headache and low back pain, depression, emphysema, hallucinations, HLD, polysubstance abuse, OSA, heat stroke (2020), recurrent sialolithiasis. CXR = no acute pulmonary findings.  07/14/2019 Esophageal dysmotility with esophageal stasis and mild reflux as  above. Pt with h/o left submandibular gland calculus.  Subjective: pt awake in chair  Recommendations for follow up therapy are one component of a multi-disciplinary discharge planning process, led by the attending physician.  Recommendations may be updated based on patient status, additional functional criteria and insurance authorization. Assessment / Plan / Recommendation   08/19/2022   9:04 AM Clinical Impressions Clinical Impression Patient presents with intact oropharyngeal swallow without aspiration nor penetration of all consistenies tested. She piecemeals across boluses, suspect compensatory.  Swallow is timely and strong without pharyngeal retention. Pt did sense pharyngeal retention during testing - when pharynx was clear.  She did not transit tablet into pharynx stating she has a hard time swallowing pills and reports they are crushed.  No impact of her submandibular stones on her swallow function observed.  Upon esophageal sweep, pt appeared with retention and backflow *known esophageal deficits.  She sensed pharynx had cleared when she maintained retention in esophagus, thus referent sensation.  Following testing, pt given water to drink - which she swallowed and immediately expectorated.  Likely refluxed into pharynx - larynx.  Pt endorses slightly thicker liquids being easier to swallow - likley due to increased viscocity slowing flow of overt reflux.  Recommend maximize liquid nutrition - especially as she reports weight loss due to her dysphagia.  Using flouro  loops, live study, educated pt to findings/recommendations - to which she advised understanding.  Swallow Evaluation SLP Visit Diagnosis Dysphagia, unspecified (R13.10) Impact on safety and function Mild aspiration risk;Risk for inadequate nutrition/hydration     08/19/2022   9:04 AM Treatment Recommendations Treatment Recommendations No treatment recommended at this time     08/18/2022   3:09 PM Prognosis Prognosis for Safe Diet Advancement Good   08/19/2022   9:04 AM Diet Recommendations SLP Diet Recommendations Other (Comment) Medication Administration Other (Comment) Compensations Slow rate;Small sips/bites Postural Changes Remain semi-upright after after feeds/meals (Comment);Seated upright at 90 degrees     08/19/2022  9:04 AM Other Recommendations Oral Care Recommendations Oral care BID Follow Up Recommendations No SLP follow up Functional Status Assessment Patient has not had a recent decline in their functional status   08/18/2022   3:09 PM Frequency and Duration  Speech Therapy Frequency (ACUTE ONLY) --     08/19/2022   8:53 AM Oral Phase Oral Phase WFL Oral - Honey Teaspoon NT Oral - Honey Cup NT Oral - Nectar Cup [facility] Oral - Thin Cup WFL Oral - Thin Straw WFL Oral - Puree WFL;Piecemeal swallowing Oral - Mech Soft WFL;Piecemeal swallowing Oral - Pill Other (Comment)    08/19/2022   9:02 AM Pharyngeal Phase Pharyngeal Phase Impaired Pharyngeal- Nectar Cup WFL Pharyngeal- Thin Teaspoon WFL Pharyngeal- Thin Cup WFL Pharyngeal- Thin Straw WFL Pharyngeal- Puree WFL Pharyngeal- Mechanical Soft WFL Pharyngeal- Pill WFL Pharyngeal Comment pt with intact pharyngeal swallow but senses pharyngeal retention -referrant to esophagus    08/19/2022   9:04 AM Cervical Esophageal Phase  Cervical Esophageal Phase WFL Kathleen Lime, MS [facility] SLP Acute Rehab Services Office [phone] Pager [phone] Macario Golds 08/19/2022, 9:12 AM      .               DG Chest Port 1 View  Result Date: 08/17/2022 CLINICAL DATA:  Shortness of breath.   History of asthma. EXAM: PORTABLE CHEST 1 VIEW COMPARISON:  Multiple previous chest x-rays. The most recent is 05/22/2022 FINDINGS: The cardiac silhouette, mediastinal and hilar contours are within normal limits and stable. The lungs are clear of an acute process. Mild hyperinflation noted. Rounded density in the left lower lobe is consistent with a left nipple shadow. IMPRESSION: Mild hyperinflation but no acute pulmonary findings. Electronically Signed   By: Marijo Sanes M.D.   On: 08/17/2022 09:42    Microbiology: Results for orders placed or performed during the hospital encounter of 08/17/22  SARS Coronavirus 2 by RT PCR (hospital order, performed in [facility] hospital lab) *cepheid single result test* Anterior Nasal Swab     Status: None   Collection Time: 08/17/22  2:52 PM   Specimen: Anterior Nasal Swab  Result Value Ref Range Status   SARS Coronavirus 2 by RT PCR NEGATIVE NEGATIVE Final    Comment: (NOTE) SARS-CoV-2 target nucleic acids are NOT DETECTED.  The SARS-CoV-2 RNA is generally detectable in upper and lower respiratory specimens during the acute phase of infection. The lowest concentration of SARS-CoV-2 viral copies this assay can detect is 250 copies / mL. A negative result does not preclude SARS-CoV-2 infection and should not be used as the sole basis for treatment or other patient management decisions.  A negative result may occur with improper specimen collection / handling, submission of specimen other than nasopharyngeal swab, presence of viral mutation(s) within the areas targeted by this assay, and inadequate number of viral copies (<250 copies / mL). A negative result must be combined with clinical observations, patient history, and epidemiological information.  Fact Sheet for Patients:   https://www.patel.info/  Fact Sheet for Healthcare Providers: https://hall.com/  This test is not yet approved or  cleared by the  [place] FDA and has been authorized for detection and/or diagnosis of SARS-CoV-2 by FDA under an Emergency Use Authorization (EUA).  This EUA will remain in effect (meaning this test can be used) for the duration of the COVID-19 declaration under Section 564(b)(1) of the Act, 21 U.S.C. section 360bbb-3(b)(1), unless the authorization is terminated or revoked sooner.  Performed at [facility], [place] [address] [place], [place] [address]   Urine Culture     Status: Abnormal   Collection Time: 08/17/22  6:49 PM   Specimen: Urine, Clean Catch  Result Value Ref Range Status   Specimen Description   Final    URINE, CLEAN CATCH Performed at [facility], [place] [address], [place], [place] [address]    Special Requests   Final    NONE Performed at [facility], [place] [address], [place], [place] [address]    Culture (A)  Final    80,000 COLONIES/mL LACTOBACILLUS SPECIES Standardized susceptibility testing for this organism is not available. Performed at [facility], [place] [address], [place], [place] [address]    Report Status 08/19/2022 FINAL  Final    Labs: CBC: Recent Labs  Lab 08/17/22 0957 08/18/22 0540  WBC 8.5 14.9*  NEUTROABS 6.4  --   HGB 15.2* 15.2*  HCT 47.2* 47.3*  MCV 87.9 87.6  PLT 318 315   Basic Metabolic Panel: Recent Labs  Lab 08/17/22 1219 08/17/22 1757 08/18/22 0540  NA 144  --  139  K 3.3*  --  3.8  CL 112*  --  105  CO2 26  --  27  GLUCOSE 103*  --  113*  BUN 9  --  14  CREATININE 1.05*  --  0.91  CALCIUM 8.0*  --  9.2  MG  --  2.2  --    Liver Function Tests: Recent Labs  Lab 08/17/22 1219 08/18/22 0540  AST 23 21  ALT 22 25  ALKPHOS 53 64  BILITOT 0.4 0.6  PROT 5.8* 6.9  ALBUMIN 3.3* 3.6   CBG: No results for input(s): "GLUCAP" in the last 168 hours.  Discharge time spent: 42 minutes.   Signed: Hosie Poisson, MD [facility] 08/21/2022

## 2022-08-27 ENCOUNTER — Other Ambulatory Visit (HOSPITAL_COMMUNITY): Payer: Self-pay

## 2022-08-30 ENCOUNTER — Emergency Department (HOSPITAL_COMMUNITY): Payer: Medicaid Other

## 2022-08-30 ENCOUNTER — Ambulatory Visit (INDEPENDENT_AMBULATORY_CARE_PROVIDER_SITE_OTHER): Payer: Medicaid Other | Admitting: Student

## 2022-08-30 ENCOUNTER — Encounter: Payer: Self-pay | Admitting: Student

## 2022-08-30 ENCOUNTER — Emergency Department (HOSPITAL_COMMUNITY)
Admission: EM | Admit: 2022-08-30 | Discharge: 2022-08-30 | Disposition: A | Discharge status: Home / Self Care | Payer: Medicaid Other | Attending: Emergency Medicine | Admitting: Emergency Medicine

## 2022-08-30 ENCOUNTER — Other Ambulatory Visit (HOSPITAL_COMMUNITY): Payer: Self-pay

## 2022-08-30 ENCOUNTER — Other Ambulatory Visit: Payer: Self-pay

## 2022-08-30 VITALS — BP 110/95 | HR 96 | Temp 98.1°F | Ht 63.0 in | Wt 132.6 lb

## 2022-08-30 DIAGNOSIS — S298XXA Other specified injuries of thorax, initial encounter: Secondary | ICD-10-CM

## 2022-08-30 DIAGNOSIS — S299XXA Unspecified injury of thorax, initial encounter: Secondary | ICD-10-CM | POA: Diagnosis present

## 2022-08-30 DIAGNOSIS — J449 Chronic obstructive pulmonary disease, unspecified: Secondary | ICD-10-CM | POA: Diagnosis present

## 2022-08-30 DIAGNOSIS — J4551 Severe persistent asthma with (acute) exacerbation: Secondary | ICD-10-CM | POA: Diagnosis not present

## 2022-08-30 DIAGNOSIS — M542 Cervicalgia: Secondary | ICD-10-CM | POA: Insufficient documentation

## 2022-08-30 DIAGNOSIS — Z87891 Personal history of nicotine dependence: Secondary | ICD-10-CM | POA: Diagnosis not present

## 2022-08-30 DIAGNOSIS — Y92410 Unspecified street and highway as the place of occurrence of the external cause: Secondary | ICD-10-CM | POA: Diagnosis not present

## 2022-08-30 DIAGNOSIS — R1012 Left upper quadrant pain: Secondary | ICD-10-CM | POA: Diagnosis not present

## 2022-08-30 DIAGNOSIS — J45901 Unspecified asthma with (acute) exacerbation: Secondary | ICD-10-CM

## 2022-08-30 DIAGNOSIS — R519 Headache, unspecified: Secondary | ICD-10-CM | POA: Insufficient documentation

## 2022-08-30 DIAGNOSIS — J4489 Other specified chronic obstructive pulmonary disease: Secondary | ICD-10-CM

## 2022-08-30 LAB — BASIC METABOLIC PANEL
Anion gap: 9 (ref 5–15)
BUN: 8 mg/dL (ref 8–23)
CO2: 23 mmol/L (ref 22–32)
Calcium: 9 mg/dL (ref 8.9–10.3)
Chloride: 109 mmol/L (ref 98–111)
Creatinine, Ser: 1.1 mg/dL — ABNORMAL HIGH (ref 0.44–1.00)
GFR, Estimated: 57 mL/min — ABNORMAL LOW (ref >60)
Glucose, Bld: 90 mg/dL (ref 70–99)
Potassium: 4 mmol/L (ref 3.5–5.1)
Sodium: 141 mmol/L (ref 135–145)

## 2022-08-30 LAB — CBC
HCT: 47.4 % — ABNORMAL HIGH (ref 36.0–46.0)
Hemoglobin: 15.4 g/dL — ABNORMAL HIGH (ref 12.0–15.0)
MCH: 28.6 pg (ref 26.0–34.0)
MCHC: 32.5 g/dL (ref 30.0–36.0)
MCV: 88.1 fL (ref 80.0–100.0)
Platelets: 294 10*3/uL (ref 150–400)
RBC: 5.38 MIL/uL — ABNORMAL HIGH (ref 3.87–5.11)
RDW: 13.5 % (ref 11.5–15.5)
WBC: 7.3 10*3/uL (ref 4.0–10.5)
nRBC: 0 % (ref 0.0–0.2)

## 2022-08-30 MED ORDER — HYDROMORPHONE HCL 1 MG/ML IJ SOLN
0.5000 mg | Freq: Once | INTRAMUSCULAR | Status: AC
  Administered 2022-08-30: 0.5 mg via INTRAVENOUS
  Filled 2022-08-30: qty 1

## 2022-08-30 MED ORDER — IPRATROPIUM-ALBUTEROL 0.5-2.5 (3) MG/3ML IN SOLN
3.0000 mL | Freq: Once | RESPIRATORY_TRACT | Status: AC
  Administered 2022-08-30: 3 mL via RESPIRATORY_TRACT
  Filled 2022-08-30: qty 3

## 2022-08-30 MED ORDER — IOHEXOL 350 MG/ML SOLN
75.0000 mL | Freq: Once | INTRAVENOUS | Status: AC | PRN
  Administered 2022-08-30: 75 mL via INTRAVENOUS

## 2022-08-30 MED ORDER — KETOROLAC TROMETHAMINE 15 MG/ML IJ SOLN
15.0000 mg | Freq: Once | INTRAMUSCULAR | Status: AC
  Administered 2022-08-30: 15 mg via INTRAVENOUS
  Filled 2022-08-30: qty 1

## 2022-08-30 MED ORDER — PREDNISONE 20 MG PO TABS
40.0000 mg | ORAL_TABLET | Freq: Every day | ORAL | 0 refills | Status: DC
  Filled 2022-08-30 – 2022-09-22 (×2): qty 10, 5d supply, fill #0

## 2022-08-30 MED ORDER — OXYCODONE-ACETAMINOPHEN 5-325 MG PO TABS: 1.0000 | ORAL_TABLET | Freq: Three times a day (TID) | ORAL | 0 refills | Status: DC | PRN

## 2022-08-30 MED ORDER — FENTANYL CITRATE PF 50 MCG/ML IJ SOSY
50.0000 ug | PREFILLED_SYRINGE | Freq: Once | INTRAMUSCULAR | Status: AC
  Administered 2022-08-30: 50 ug via INTRAVENOUS
  Filled 2022-08-30: qty 1

## 2022-08-30 NOTE — Patient Instructions (Signed)
Kathleen [name], it was a pleasure seeing you today!  Today we discussed: - Breathing issues: I have prescribed prednisone for thel next five days. It does not look like an appointment is scheduled for [facility] Pulmonology. You can call them at [phone]. Please come back to clinic if symptoms persist.    Follow-up:  if symptoms persist    Please make sure to arrive 15 minutes prior to your next appointment. If you arrive late, you may be asked to reschedule.   We look forward to seeing you next time. Please call our clinic at [phone] if you have any questions or concerns. The best time to call is Monday-Friday from 9am-4pm, but there is someone available 24/7. If after hours or the weekend, call the main hospital number and ask for the Internal Medicine Resident On-Call. If you need medication refills, please notify your pharmacy one week in advance and they will send [place] a request.  Thank you for letting us take part in your care. Wishing you the best!  Thank you, [name], MD

## 2022-08-30 NOTE — ED Notes (Signed)
Pt back from x-ray.

## 2022-08-30 NOTE — ED Notes (Signed)
Secretary informed this RN that pt is requesting breathing treatments. Pt states it has been 3 hours since her last one at home. [place] MD notified, breathing treatment ordered. Pt at CT.

## 2022-08-30 NOTE — ED Notes (Signed)
Patient transported to X-ray 
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
## 2022-08-30 NOTE — ED Notes (Signed)
Pt ambulated to bathroom with assistance.
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
## 2022-08-31 NOTE — Progress Notes (Signed)
CC: chronic cough  HPI:  [name] is a 61 y.o. person with asthma-COPD overlap syndrome, GERD, tobacco use,  presenting to [facility] for cough.   Please see problem-based list for further details, assessments, and plans.  Past Medical History:  Diagnosis Date   Anxiety    Arthritis    hands   Asthma    2 sets of PFT's in 04/09 without sign variability. Last set with significant decrease in FEV1 with saline alone, suggesting Asthma but  recommended clinical corelation    Asthma    Bipolar 1 disorder [facility])    therapist is Joy and is followed by [facility]   Blackout    negative work up including ESR, ANA, opthalmology referral, carotid dopplers, 2D echo, MRI and EEG.   BREAST LUMP 03/25/2008   Annotation: bilaterally Qualifier: Diagnosis of  By: Tomasa Hosteller MD, Edmon Crape.    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 Further work up includes CRP, ESR, SPEP/UPEP, hepatitis erology, A1C , repeat ANA   COPD (chronic obstructive pulmonary disease) ([place])    COPD exacerbation ([place]) 03/31/2019   Depression    DUB (dysfunctional uterine bleeding)    and pelvic pain, with negative endometrial bx in 07/09 and transvaginal U/S significant for mild fibroids in 0/09.   Emphysema of lung ([place])    GERD (gastroesophageal reflux disease)    Hallucinations 03/18/2008   Qualifier: Diagnosis of  By: Redmond Pulling  MD, Mateo Flow     Hyperlipidemia    Lower extremity edema    Neg ABI's, normal 2D echo, normal albumin   Menorrhagia    Ovarian cyst    Polysubstance abuse ([place])    none since March 17,2009.   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 No murmurs appreciated. Warm extremities.  Pulm: Normal work of breathing on room air. Clear to ausculation bilaterally.  Skin: Warm, dry. No rashes or lesions appreciated. Neuro: Awake, alert, conversing appropriately. Grossly non-focal. Psych: Normal mood, affect, speech.  Assessment & Plan:   Asthma exacerbation Kathleen [name] is presenting for worsening cough, dyspnea. Patient was recently admitted to the hospital for COPD exacerbation, discharged on 9/8. Kathleen [name] reports she initially felt better after being home for a few days, but over the last week her symptoms have worsened again. Mentions that this is a chronic, relapsing issue and feels frustrated that she can't get over this cough. She has been using her nebulizer more frequently, which does help some. She has also been compliant with Trelegy and has been using her albuterol inhaler daily, which is not  usual for her.   On exam, she is resting comfortably, no increased work of breathing, clear to ausculation bilaterally. Given the severity of her disease, increase in symptoms, and daily use of albuterol we will go ahead and treat for asthma/COPD exacerbation. She will continue with her usual nebulizer and maintenance therapy.  - Start prednisone 16m daily - Continue daily Trelegy - DuoNebs every 6 hours as needed - Albuterol inhaler every 6 hours as needed - Continue montelukast 13mQHS - Follow-up with pulmonology, office number given on AVS  Patient discussed with Dr. MuAndrey SpearmanMD Internal Medicine PGY-3 Pager: [phone]

## 2022-08-31 NOTE — Assessment & Plan Note (Signed)
>>  ASSESSMENT AND PLAN FOR ASTHMA EXACERBATION WRITTEN ON 08/31/2022  3:38 PM BY [name], MD  Ms. Kogler is presenting for worsening cough, dyspnea. Patient was recently admitted to the hospital for COPD exacerbation, discharged on 9/8. Ms. Blanchette reports she initially felt better after being home for a few days, but over the last week her symptoms have worsened again. Mentions that this is a chronic, relapsing issue and feels frustrated that she can't get over this cough. She has been using her nebulizer more frequently, which does help some. She has also been compliant with Trelegy and has been using her albuterol inhaler daily, which is not usual for her.   On exam, she is resting comfortably, no increased work of breathing, clear to ausculation bilaterally. Given the severity of her disease, increase in symptoms, and daily use of albuterol we will go ahead and treat for asthma/COPD exacerbation. She will continue with her usual nebulizer and maintenance therapy.  - Start prednisone '40mg'$  daily - Continue daily Trelegy - DuoNebs every 6 hours as needed - Albuterol inhaler every 6 hours as needed - Continue montelukast '10mg'$  QHS - Follow-up with pulmonology, office number given on AVS

## 2022-08-31 NOTE — Assessment & Plan Note (Addendum)
Kathleen [name] is presenting for worsening cough, dyspnea. Patient was recently admitted to the hospital for COPD exacerbation, discharged on 9/8. Kathleen [name] reports she initially felt better after being home for a few days, but over the last week her symptoms have worsened again. Mentions that this is a chronic, relapsing issue and feels frustrated that she can't get over this cough. She has been using her nebulizer more frequently, which does help some. She has also been compliant with Trelegy and has been using her albuterol inhaler daily, which is not usual for her.   On exam, she is resting comfortably, no increased work of breathing, clear to ausculation bilaterally. Given the severity of her disease, increase in symptoms, and daily use of albuterol we will go ahead and treat for asthma/COPD exacerbation. She will continue with her usual nebulizer and maintenance therapy.  - Start prednisone '40mg'$  daily - Continue daily Trelegy - DuoNebs every 6 hours as needed - Albuterol inhaler every 6 hours as needed - Continue montelukast '10mg'$  QHS - Follow-up with pulmonology, office number given on AVS

## 2022-09-01 ENCOUNTER — Other Ambulatory Visit (HOSPITAL_COMMUNITY): Payer: Self-pay

## 2022-09-07 NOTE — Progress Notes (Signed)
Internal Medicine Clinic Attending ? ?Case discussed with Dr. Braswell  at the time of the visit.  We reviewed the resident?s history and exam and pertinent patient test results.  I agree with the assessment, diagnosis, and plan of care documented in the resident?s note.  ?

## 2022-09-08 ENCOUNTER — Ambulatory Visit (HOSPITAL_COMMUNITY): Payer: Medicaid Other

## 2022-09-21 ENCOUNTER — Telehealth: Payer: Self-pay

## 2022-09-21 NOTE — Telephone Encounter (Signed)
Returned call to patient. Explained she has refills on all meds except albuterol HFA. Some meds went to [facility] and some went to [facility]. She will request [facility] retrieve refills from [facility]. This pharmacy has been removed from list.

## 2022-09-21 NOTE — Telephone Encounter (Signed)
Pt s requesting a call back she is requesting meds for her asthma and codp

## 2022-09-22 ENCOUNTER — Other Ambulatory Visit (HOSPITAL_COMMUNITY): Payer: Self-pay

## 2022-09-22 MED ORDER — ALBUTEROL SULFATE HFA 108 (90 BASE) MCG/ACT IN AERS: 2.0000 | INHALATION_SPRAY | Freq: Four times a day (QID) | RESPIRATORY_TRACT | 2 refills | Status: DC | PRN

## 2022-09-22 NOTE — Addendum Note (Signed)
Addended by: Riesa Pope on: 09/22/2022 02:05 PM   Modules accepted: Orders

## 2022-09-22 NOTE — Telephone Encounter (Signed)
Afternoon, is she needing refills on her medications? I didn't have any refill requests in my box. Thanks

## 2022-10-06 ENCOUNTER — Other Ambulatory Visit: Payer: Self-pay

## 2022-10-06 ENCOUNTER — Telehealth: Payer: Self-pay

## 2022-10-06 DIAGNOSIS — J45901 Unspecified asthma with (acute) exacerbation: Secondary | ICD-10-CM

## 2022-10-06 MED ORDER — ALBUTEROL SULFATE HFA 108 (90 BASE) MCG/ACT IN AERS: 2.0000 | INHALATION_SPRAY | Freq: Four times a day (QID) | RESPIRATORY_TRACT | 2 refills | Status: DC | PRN

## 2022-10-06 MED ORDER — PREDNISONE 20 MG PO TABS: 40.0000 mg | ORAL_TABLET | Freq: Every day | ORAL | 0 refills | Status: DC

## 2022-10-06 MED ORDER — IPRATROPIUM-ALBUTEROL 0.5-2.5 (3) MG/3ML IN SOLN: 3.0000 mL | Freq: Four times a day (QID) | RESPIRATORY_TRACT | 3 refills | Status: DC | PRN

## 2022-10-06 NOTE — Telephone Encounter (Addendum)
Refills sent in. Would recommend she follow up at ED or urgent care if worsens. She will need to follow up with [place] or her pulmonologist Dr. Silas Flood.

## 2022-10-06 NOTE — Telephone Encounter (Signed)
Call from patient states is having some problems breathing wanted to come in today for a Prednisone injection. Patient later asked if she could just get a refill on her Inhalers.  Request was sent to PCP.

## 2022-10-06 NOTE — Telephone Encounter (Signed)
Patient called for rx refill she stated she is having trouble breathing also stated the pharamacy gave her the wrong rx the last time she picked up her medicine patient is requesting a rx refill for ipratropium-albuterol (DUONEB)

## 2022-10-08 ENCOUNTER — Telehealth: Payer: Self-pay | Admitting: *Deleted

## 2022-10-08 NOTE — Telephone Encounter (Signed)
Patient called in in obvious respiratory distress. Audible wheezing and gasping heard during conversation. States she is taking all her meds but they're not helping. She is advised to have someone drive her to nearest ED now. States she will.

## 2022-10-08 NOTE — Telephone Encounter (Signed)
Agree 

## 2022-10-18 ENCOUNTER — Encounter: Payer: Medicaid Other | Admitting: Internal Medicine

## 2022-10-24 ENCOUNTER — Telehealth: Payer: Self-pay | Admitting: Internal Medicine

## 2022-10-24 DIAGNOSIS — J45901 Unspecified asthma with (acute) exacerbation: Secondary | ICD-10-CM

## 2022-10-24 MED ORDER — PREDNISONE 20 MG PO TABS: 40.0000 mg | ORAL_TABLET | Freq: Every day | ORAL | 0 refills | Status: DC

## 2022-10-24 MED ORDER — GUAIFENESIN-DM 100-10 MG/5ML PO SYRP: 5.0000 mL | ORAL_SOLUTION | ORAL | 0 refills | Status: DC | PRN

## 2022-10-24 NOTE — Telephone Encounter (Signed)
Patient contacted after hours pager due to acute exacerbation of asthma. She says that over the last 2-3 days she has been having excessive coughing non-responsive to current medical therapy that has led her to gagging and vomiting. She has no sick contacts and does not feel ill. She states that when the weather shifted from warm to cold the symptoms came on. This is a known trigger for her.  Current medications: Albuterol inhaler, she has been using 3-4 times daily Duoneb, 4-6 times per day Singulair daily Claritin daily Protonix daily Spiriva daily Dulera, though this is not on her medicine list  She is NOT taking: Trelegy Robitussin  She is audibly wheezing over the phone but able to speak in complete sentences. She says that at her last admission she was told that she would be sent home with oxygen but this was never arranged. She feels short of breath with walking even to the mailbox or around the house. She has been treated with prednisone therapy in the past which significantly helps with her symptoms and requests prednisone be sent to the pharmacy in addition to Robitussin to help with her cough. She has family at home who will be able to keep an eye on her for any decompensation.  I advised that I recommend that she go to ED, but given the 6+ hour ER wait, and that there are no open urgent cares at this time, sending in prednisone to get her started tonight is reasonable. I have counseled her extensively and had her repeat back to me conditions for presenting to the ED, specifically if she experiences increased shortness of breath or trouble breathing.  I have advised that she present to urgent care or the ED first thing tomorrow morning if she does not have relief after taking prednisone tonight. I have also advised that she call the clinic tomorrow to schedule a visit to be assessed for outpatient O2 therapy. She agrees to do this.  Farrel Gordon, DO Internal Medicine PGY-2

## 2022-10-26 ENCOUNTER — Emergency Department (HOSPITAL_COMMUNITY): Payer: Commercial Managed Care - HMO

## 2022-10-26 ENCOUNTER — Encounter (HOSPITAL_COMMUNITY): Payer: Self-pay

## 2022-10-26 ENCOUNTER — Inpatient Hospital Stay (HOSPITAL_COMMUNITY)
Admission: EM | Admit: 2022-10-26 | Discharge: 2022-10-28 | DRG: 190 | Disposition: A | Discharge status: Home / Self Care | Payer: Commercial Managed Care - HMO | Attending: Internal Medicine | Admitting: Internal Medicine

## 2022-10-26 ENCOUNTER — Other Ambulatory Visit: Payer: Self-pay

## 2022-10-26 DIAGNOSIS — M545 Low back pain, unspecified: Secondary | ICD-10-CM | POA: Diagnosis present

## 2022-10-26 DIAGNOSIS — J449 Chronic obstructive pulmonary disease, unspecified: Secondary | ICD-10-CM | POA: Diagnosis present

## 2022-10-26 DIAGNOSIS — Z91030 Bee allergy status: Secondary | ICD-10-CM

## 2022-10-26 DIAGNOSIS — E785 Hyperlipidemia, unspecified: Secondary | ICD-10-CM | POA: Diagnosis present

## 2022-10-26 DIAGNOSIS — G4733 Obstructive sleep apnea (adult) (pediatric): Secondary | ICD-10-CM | POA: Diagnosis present

## 2022-10-26 DIAGNOSIS — Z79899 Other long term (current) drug therapy: Secondary | ICD-10-CM

## 2022-10-26 DIAGNOSIS — F17210 Nicotine dependence, cigarettes, uncomplicated: Secondary | ICD-10-CM | POA: Diagnosis present

## 2022-10-26 DIAGNOSIS — F411 Generalized anxiety disorder: Secondary | ICD-10-CM | POA: Diagnosis present

## 2022-10-26 DIAGNOSIS — Z91018 Allergy to other foods: Secondary | ICD-10-CM

## 2022-10-26 DIAGNOSIS — F1210 Cannabis abuse, uncomplicated: Secondary | ICD-10-CM | POA: Diagnosis present

## 2022-10-26 DIAGNOSIS — Z888 Allergy status to other drugs, medicaments and biological substances status: Secondary | ICD-10-CM

## 2022-10-26 DIAGNOSIS — J441 Chronic obstructive pulmonary disease with (acute) exacerbation: Secondary | ICD-10-CM | POA: Diagnosis not present

## 2022-10-26 DIAGNOSIS — K219 Gastro-esophageal reflux disease without esophagitis: Secondary | ICD-10-CM | POA: Diagnosis present

## 2022-10-26 DIAGNOSIS — Z720 Tobacco use: Secondary | ICD-10-CM | POA: Diagnosis present

## 2022-10-26 DIAGNOSIS — Z1152 Encounter for screening for COVID-19: Secondary | ICD-10-CM

## 2022-10-26 DIAGNOSIS — J9601 Acute respiratory failure with hypoxia: Secondary | ICD-10-CM | POA: Diagnosis present

## 2022-10-26 DIAGNOSIS — F319 Bipolar disorder, unspecified: Secondary | ICD-10-CM | POA: Diagnosis present

## 2022-10-26 DIAGNOSIS — Z7951 Long term (current) use of inhaled steroids: Secondary | ICD-10-CM

## 2022-10-26 DIAGNOSIS — G8929 Other chronic pain: Secondary | ICD-10-CM | POA: Diagnosis present

## 2022-10-26 LAB — BASIC METABOLIC PANEL
Anion gap: 7 (ref 5–15)
BUN: 12 mg/dL (ref 8–23)
CO2: 25 mmol/L (ref 22–32)
Calcium: 9 mg/dL (ref 8.9–10.3)
Chloride: 110 mmol/L (ref 98–111)
Creatinine, Ser: 1.02 mg/dL — ABNORMAL HIGH (ref 0.44–1.00)
GFR, Estimated: >60 mL/min (ref >60)
Glucose, Bld: 107 mg/dL — ABNORMAL HIGH (ref 70–99)
Potassium: 3.7 mmol/L (ref 3.5–5.1)
Sodium: 142 mmol/L (ref 135–145)

## 2022-10-26 LAB — CBC WITH DIFFERENTIAL/PLATELET
Abs Immature Granulocytes: 0.03 10*3/uL (ref 0.00–0.07)
Basophils Absolute: 0.1 10*3/uL (ref 0.0–0.1)
Basophils Relative: 1 %
Eosinophils Absolute: 0.6 10*3/uL — ABNORMAL HIGH (ref 0.0–0.5)
Eosinophils Relative: 5 %
HCT: 46.7 % — ABNORMAL HIGH (ref 36.0–46.0)
Hemoglobin: 15.2 g/dL — ABNORMAL HIGH (ref 12.0–15.0)
Immature Granulocytes: 0 %
Lymphocytes Relative: 16 %
Lymphs Abs: 1.9 10*3/uL (ref 0.7–4.0)
MCH: 28.1 pg (ref 26.0–34.0)
MCHC: 32.5 g/dL (ref 30.0–36.0)
MCV: 86.5 fL (ref 80.0–100.0)
Monocytes Absolute: 0.6 10*3/uL (ref 0.1–1.0)
Monocytes Relative: 5 %
Neutro Abs: 8.6 10*3/uL — ABNORMAL HIGH (ref 1.7–7.7)
Neutrophils Relative %: 73 %
Platelets: 302 10*3/uL (ref 150–400)
RBC: 5.4 MIL/uL — ABNORMAL HIGH (ref 3.87–5.11)
RDW: 13.5 % (ref 11.5–15.5)
WBC: 11.9 10*3/uL — ABNORMAL HIGH (ref 4.0–10.5)
nRBC: 0 % (ref 0.0–0.2)

## 2022-10-26 LAB — RESP PANEL BY RT-PCR (FLU A&B, COVID) ARPGX2
Influenza A by PCR: NEGATIVE
Influenza B by PCR: NEGATIVE
SARS Coronavirus 2 by RT PCR: NEGATIVE

## 2022-10-26 LAB — TROPONIN I (HIGH SENSITIVITY)
Troponin I (High Sensitivity): 4 ng/L (ref <18)
Troponin I (High Sensitivity): 4 ng/L (ref <18)

## 2022-10-26 LAB — BLOOD GAS, VENOUS
Acid-base deficit: 0.2 mmol/L (ref 0.0–2.0)
Bicarbonate: 26.4 mmol/L (ref 20.0–28.0)
O2 Saturation: 88.9 %
Patient temperature: 37
pCO2, Ven: 50 mmHg (ref 44–60)
pH, Ven: 7.33 (ref 7.25–7.43)
pO2, Ven: 58 mmHg — ABNORMAL HIGH (ref 32–45)

## 2022-10-26 LAB — LIPASE, BLOOD: Lipase: 27 U/L (ref 11–51)

## 2022-10-26 LAB — BRAIN NATRIURETIC PEPTIDE: B Natriuretic Peptide: 38.6 pg/mL (ref 0.0–100.0)

## 2022-10-26 MED ORDER — PANTOPRAZOLE SODIUM 40 MG PO TBEC
40.0000 mg | DELAYED_RELEASE_TABLET | Freq: Every day | ORAL | Status: DC
  Administered 2022-10-26 – 2022-10-28 (×3): 40 mg via ORAL
  Filled 2022-10-26 (×3): qty 1

## 2022-10-26 MED ORDER — PREDNISONE 20 MG PO TABS
40.0000 mg | ORAL_TABLET | Freq: Every day | ORAL | Status: DC
  Administered 2022-10-27 – 2022-10-28 (×2): 40 mg via ORAL
  Filled 2022-10-26 (×2): qty 2

## 2022-10-26 MED ORDER — LORAZEPAM 2 MG/ML IJ SOLN
1.0000 mg | Freq: Once | INTRAMUSCULAR | Status: AC
  Administered 2022-10-26: 1 mg via INTRAVENOUS
  Filled 2022-10-26: qty 1

## 2022-10-26 MED ORDER — ACETAMINOPHEN 325 MG PO TABS
650.0000 mg | ORAL_TABLET | Freq: Four times a day (QID) | ORAL | Status: DC | PRN
  Administered 2022-10-26 – 2022-10-28 (×5): 650 mg via ORAL
  Filled 2022-10-26 (×5): qty 2

## 2022-10-26 MED ORDER — LORAZEPAM 0.5 MG PO TABS
0.5000 mg | ORAL_TABLET | Freq: Four times a day (QID) | ORAL | Status: DC | PRN
  Administered 2022-10-26 – 2022-10-28 (×5): 0.5 mg via ORAL
  Filled 2022-10-26 (×5): qty 1

## 2022-10-26 MED ORDER — METHYLPREDNISOLONE SODIUM SUCC 125 MG IJ SOLR
81.2500 mg | Freq: Once | INTRAMUSCULAR | Status: AC
  Administered 2022-10-26: 81.25 mg via INTRAVENOUS
  Filled 2022-10-26: qty 2

## 2022-10-26 MED ORDER — IPRATROPIUM-ALBUTEROL 0.5-2.5 (3) MG/3ML IN SOLN
3.0000 mL | RESPIRATORY_TRACT | Status: DC
  Administered 2022-10-26 – 2022-10-28 (×10): 3 mL via RESPIRATORY_TRACT
  Filled 2022-10-26 (×10): qty 3

## 2022-10-26 MED ORDER — GUAIFENESIN ER 600 MG PO TB12
600.0000 mg | ORAL_TABLET | Freq: Two times a day (BID) | ORAL | Status: DC
  Administered 2022-10-26 – 2022-10-28 (×4): 600 mg via ORAL
  Filled 2022-10-26 (×4): qty 1

## 2022-10-26 MED ORDER — GUAIFENESIN-DM 100-10 MG/5ML PO SYRP
10.0000 mL | ORAL_SOLUTION | ORAL | Status: DC | PRN
  Administered 2022-10-26 – 2022-10-27 (×2): 10 mL via ORAL
  Filled 2022-10-26 (×2): qty 10

## 2022-10-26 MED ORDER — ENOXAPARIN SODIUM 40 MG/0.4ML IJ SOSY
40.0000 mg | PREFILLED_SYRINGE | INTRAMUSCULAR | Status: DC
  Administered 2022-10-26 – 2022-10-27 (×2): 40 mg via SUBCUTANEOUS
  Filled 2022-10-26 (×2): qty 0.4

## 2022-10-26 MED ORDER — ALBUTEROL SULFATE HFA 108 (90 BASE) MCG/ACT IN AERS
1.0000 | INHALATION_SPRAY | Freq: Four times a day (QID) | RESPIRATORY_TRACT | Status: AC | PRN
  Administered 2022-10-26 – 2022-10-27 (×2): 2 via RESPIRATORY_TRACT
  Filled 2022-10-26: qty 6.7

## 2022-10-26 MED ORDER — IPRATROPIUM-ALBUTEROL 0.5-2.5 (3) MG/3ML IN SOLN: 3.0000 mL | Freq: Four times a day (QID) | RESPIRATORY_TRACT | Status: DC

## 2022-10-26 MED ORDER — ONDANSETRON HCL 4 MG/2ML IJ SOLN
4.0000 mg | Freq: Four times a day (QID) | INTRAMUSCULAR | Status: DC | PRN
  Administered 2022-10-26 – 2022-10-28 (×2): 4 mg via INTRAVENOUS
  Filled 2022-10-26 (×2): qty 2

## 2022-10-26 MED ORDER — ONDANSETRON HCL 4 MG PO TABS
4.0000 mg | ORAL_TABLET | Freq: Four times a day (QID) | ORAL | Status: DC | PRN
  Administered 2022-10-27: 4 mg via ORAL
  Filled 2022-10-26: qty 1

## 2022-10-26 MED ORDER — MAGNESIUM SULFATE 2 GM/50ML IV SOLN
2.0000 g | Freq: Once | INTRAVENOUS | Status: AC
  Administered 2022-10-26: 2 g via INTRAVENOUS
  Filled 2022-10-26: qty 50

## 2022-10-26 MED ORDER — NICOTINE 14 MG/24HR TD PT24
14.0000 mg | MEDICATED_PATCH | Freq: Every day | TRANSDERMAL | Status: DC
  Administered 2022-10-26 – 2022-10-28 (×3): 14 mg via TRANSDERMAL
  Filled 2022-10-26 (×3): qty 1

## 2022-10-26 MED ORDER — ACETAMINOPHEN 650 MG RE SUPP: 650.0000 mg | Freq: Four times a day (QID) | RECTAL | Status: DC | PRN

## 2022-10-26 MED ORDER — IPRATROPIUM-ALBUTEROL 0.5-2.5 (3) MG/3ML IN SOLN
3.0000 mL | Freq: Four times a day (QID) | RESPIRATORY_TRACT | Status: DC
  Administered 2022-10-26: 3 mL via RESPIRATORY_TRACT
  Filled 2022-10-26: qty 3

## 2022-10-26 MED ORDER — ALBUTEROL SULFATE (2.5 MG/3ML) 0.083% IN NEBU
10.0000 mg/h | INHALATION_SOLUTION | Freq: Once | RESPIRATORY_TRACT | Status: AC
  Administered 2022-10-26: 10 mg/h via RESPIRATORY_TRACT
  Filled 2022-10-26: qty 12

## 2022-10-26 NOTE — Progress Notes (Signed)
Patient noted to be a Yellow (2) MEWS at arrival (@ [phone]) but VS will be every 4 hours for the next 12 due to new unit admission guidelines) Will continue to monitor.

## 2022-10-26 NOTE — ED Notes (Signed)
Pt in bed, pt has a slight decrease in work of breathing, pt talking in short sentences, pt appears slightly more relaxed, pt remains on continuous ned.

## 2022-10-26 NOTE — ED Triage Notes (Signed)
Pt to er room number 64, pt has inspiratory and expiratory wheezes, pt has accessory muscles use, md to bedside, rt called for continuous nebs. Pt to er via ems, per ems pt has a hx of asthma, states that they gave '5mg'$  of albuterol, 0.5 of atrovent, 125 solumedrol and 1gram of mag

## 2022-10-26 NOTE — ED Notes (Signed)
Pt in bed, meal tray given, pt requests soda, soda given.

## 2022-10-26 NOTE — ED Notes (Signed)
Rt at bedside, pt placed on continuous neb

## 2022-10-26 NOTE — H&P (Signed)
History and Physical    Patient: Kathleen [name] GXQ:[id number] DOB: [birth year] DOA: 10/26/2022 DOS: the patient was seen and examined on 10/26/2022 PCP: Riesa Pope, MD  Patient coming from: Home  Chief Complaint:  Chief Complaint  Patient presents with   Shortness of Breath   HPI: Kathleen [name] is a 61 y.o. female with medical history significant of tobacco abuse, COPD, GERD. Presenting with dyspnea. Her symptoms started 3 days ago. She had cough and shortness of breath. She didn't have any sick contact, F/N/V/D. She tried her home nebulizers, but they didn't provide complete relief. When her symptoms persisted this morning, she decided to come to the ED for assistance. She denies any other aggravating or alleviating factors.   Review of Systems: As mentioned in the history of present illness. All other systems reviewed and are negative. Past Medical History:  Diagnosis Date   Anxiety    Arthritis    hands   Asthma    2 sets of PFT's in 04/09 without sign variability. Last set with significant decrease in FEV1 with saline alone, suggesting Asthma but  recommended clinical corelation    Asthma    Bipolar 1 disorder [facility])    therapist is Joy and is followed by [facility]   Blackout    negative work up including ESR, ANA, opthalmology referral, carotid dopplers, 2D echo, MRI and EEG.   BREAST LUMP 03/25/2008   Annotation: bilaterally Qualifier: Diagnosis of  By: Tomasa Hosteller MD, Edmon Crape.    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 Further work up includes CRP, ESR, SPEP/UPEP, hepatitis erology, A1C , repeat ANA   COPD (chronic obstructive pulmonary disease) ([place])     COPD exacerbation ([place]) 03/31/2019   Depression    DUB (dysfunctional uterine bleeding)    and pelvic pain, with negative endometrial bx in 07/09 and transvaginal U/S significant for mild fibroids in 0/09.   Emphysema of lung ([place])    GERD (gastroesophageal reflux disease)    Hallucinations 03/18/2008   Qualifier: Diagnosis of  By: Redmond Pulling  MD, Mateo Flow     Hyperlipidemia    Lower extremity edema    Neg ABI's, normal 2D echo, normal albumin   Menorrhagia    Ovarian cyst    Polysubstance abuse ([place])    none since March 17,2009.   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 She has a 7.50 pack-year smoking history. She has never used smokeless tobacco. She reports current alcohol use. She reports current drug use. Drug: Marijuana.  Allergies  Allergen Reactions   Bee Venom    Peach [Prunus Persica] Other (See Comments)    Throat swells , breakout   Topamax [Topiramate] Other (See Comments)    Hallucinations    Tramadol Nausea Only    Family History  Problem Relation Age of Onset   Colon cancer Mother 32   Breast cancer Mother 18   Rectal cancer Mother    Diabetes Father    Hypertension Father    Kidney disease Father    Colon cancer Father 25   Prostate cancer Father    Cancer Father    Kidney disease Sister    Cancer Brother    Breast cancer Maternal Grandmother 103   Esophageal cancer Neg Hx    Stomach cancer Neg Hx     Prior to Admission medications   Medication Sig Start Date End Date Taking? Authorizing Provider  albuterol (VENTOLIN HFA) 108 (90 Base) MCG/ACT inhaler Inhale  2 puffs into the lungs every 6 (six) hours as needed for wheezing or shortness of breath. 10/06/22   Katsadouros, Vasilios, MD  EPINEPHrine (EPIPEN 2-PAK) 0.3 mg/0.3 mL IJ SOAJ injection Inject 0.3 mg into the muscle as needed for anaphylaxis. 07/30/22   Lacinda Axon, MD  Fluticasone-Umeclidin-Vilant (TRELEGY ELLIPTA) 200-62.5-25 MCG/ACT AEPB Inhale 1 puff into the lungs daily. 08/20/22   Hosie Poisson, MD  guaiFENesin-dextromethorphan (ROBITUSSIN DM) 100-10 MG/5ML syrup Take 5 mLs by mouth every 4 (four) hours as needed for cough. 10/24/22   Farrel Gordon, DO  hydrOXYzine (ATARAX) 25 MG tablet Take 1 tablet (25 mg total) by mouth 3 (three) times daily as needed for anxiety. 07/28/22   Lacinda Axon, MD  ipratropium-albuterol (DUONEB) 0.5-2.5 (3) MG/3ML SOLN Inhale 3 mLs by nebulization every 6 (six) hours as needed (Wheezing or shortness of breath). 10/06/22   Katsadouros, Vasilios, MD  lidocaine (LIDODERM) 5 % Place 1 patch onto the skin daily. Remove & Discard patch within 12 hours or as directed by MD 07/30/22   Lacinda Axon, MD  loratadine (CLARITIN) 10 MG tablet Take 1 tablet (10 mg total) by mouth daily. 03/09/22   Hunsucker, [name], MD  montelukast (SINGULAIR) 10 MG tablet Take 1 tablet (10 mg total) by mouth at bedtime. 07/30/22   Lacinda Axon, MD  Multiple Vitamin (MULTIVITAMIN WITH MINERALS) TABS tablet Take 1 tablet by mouth daily. 08/20/22   Hosie Poisson, MD  nicotine (NICODERM CQ - DOSED IN MG/24 HOURS) 14 mg/24hr patch Place 1 patch (14 mg total) onto the skin daily. 08/20/22   Hosie Poisson, MD  ondansetron (ZOFRAN) 4 MG tablet TAKE 1 TABLET BY MOUTH ONCE DAILY AS NEEDED FOR NAUSEA FOR VOMITING Patient taking differently: Take 4 mg by mouth daily as needed for nausea or vomiting. 02/18/21   [name], MD  oxyCODONE-acetaminophen (PERCOCET/ROXICET) 5-325 MG tablet Take 1-2 tablets by mouth every 8 (eight) hours as needed for severe pain. 08/30/22   Davonna Belling, MD  pantoprazole (PROTONIX) 40 MG tablet Take 1 tablet (40 mg total) by mouth daily. 08/20/22   Hosie Poisson, MD  predniSONE (DELTASONE) 20 MG tablet Take 2 tablets (40 mg total) by mouth daily with breakfast for 5 days 10/24/22   Farrel Gordon, DO  SPIRIVA HANDIHALER 18 MCG inhalation capsule Place 1 capsule (18 mcg total) into inhaler and inhale daily. 08/20/22   Hosie Poisson, MD    Physical Exam: Vitals:   10/26/22 1113 10/26/22 1130 10/26/22 1150 10/26/22 1223  BP:  122/79 (!) 136/101 Marland Kitchen)  103/92  Pulse:  (!) 121 (!) 121 (!) 116  Resp:  (!) 35 (!) 35 (!) 23  Temp:      TempSrc:      SpO2:  99% 98% 95%  Weight: 63 kg     Height: 5' 3" (1.6 m)      General: 61 y.o. female resting in bed in NAD Eyes: PERRL, normal sclera ENMT: Nares patent w/o discharge, orophaynx clear, dentition normal, ears w/o discharge/lesions/ulcers Neck: Supple, trachea midline Cardiovascular: RRR, +S1, S2, no m/g/r, equal pulses throughout Respiratory: diffuse expiratory wheeze, no r/r, normal WOB on 4L [place] GI: BS+, NDNT, no masses noted, no organomegaly noted MSK: No e/c/c Neuro: A&O x 3, no focal deficits Psyc: Appropriate interaction and affect, calm/cooperative  Data Reviewed:  Results for orders placed or performed during the hospital encounter of 10/26/22 (from the past 24 hour(s))  Resp Panel by RT-PCR (Flu A&B, Covid) Anterior Nasal Swab     Status: None   Collection Time: 10/26/22 11:10 AM   Specimen: Anterior Nasal Swab  Result Value Ref Range   SARS Coronavirus 2 by RT PCR NEGATIVE NEGATIVE   Influenza A by PCR NEGATIVE NEGATIVE   Influenza B by PCR NEGATIVE NEGATIVE  Basic metabolic panel     Status: Abnormal   Collection Time: 10/26/22 11:10 AM  Result Value Ref Range   Sodium 142 135 - 145 mmol/L   Potassium 3.7 3.5 - 5.1 mmol/L   Chloride 110 98 - 111 mmol/L   CO2 25 22 - 32 mmol/L   Glucose, Bld 107 (H) 70 - 99 mg/dL   BUN 12 8 - 23 mg/dL   Creatinine, Ser 1.02 (H) 0.44 -  1.00 mg/dL   Calcium 9.0 8.9 - 10.3 mg/dL   GFR, Estimated >60 >60 mL/min   Anion gap 7 5 - 15  CBC with Differential/Platelet     Status: Abnormal   Collection Time: 10/26/22 11:10 AM  Result Value Ref Range   WBC 11.9 (H) 4.0 - 10.5 K/uL   RBC 5.40 (H) 3.87 - 5.11 MIL/uL   Hemoglobin 15.2 (H) 12.0 - 15.0 g/dL   HCT 46.7 (H) 36.0 - 46.0 %   MCV 86.5 80.0 - 100.0 fL   MCH 28.1 26.0 - 34.0 pg   MCHC 32.5 30.0 - 36.0 g/dL   RDW 13.5 11.5 - 15.5 %   Platelets 302 150 - 400 K/uL   nRBC 0.0 0.0 - 0.2 %   Neutrophils Relative % 73 %   Neutro Abs 8.6 (H) 1.7 - 7.7 K/uL   Lymphocytes Relative 16 %   Lymphs Abs 1.9 0.7 - 4.0 K/uL   Monocytes Relative 5 %   Monocytes Absolute 0.6 0.1 - 1.0 K/uL   Eosinophils Relative 5 %   Eosinophils Absolute 0.6 (H) 0.0 - 0.5 K/uL   Basophils Relative 1 %   Basophils Absolute 0.1 0.0 - 0.1 K/uL   Immature Granulocytes 0 %   Abs Immature Granulocytes 0.03 0.00 - 0.07 K/uL  Blood gas, venous     Status: Abnormal   Collection Time: 10/26/22 11:10 AM  Result Value Ref Range   pH, Ven 7.33 7.25 - 7.43   pCO2, Ven 50 44 - 60 mmHg   pO2, Ven 58 (H) 32 - 45 mmHg   Bicarbonate 26.4 20.0 - 28.0 mmol/L   Acid-base deficit 0.2 0.0 - 2.0 mmol/L   O2 Saturation 88.9 %   Patient temperature 37.0   Brain natriuretic peptide  Status: None   Collection Time: 10/26/22 11:10 AM  Result Value Ref Range   B Natriuretic Peptide 38.6 0.0 - 100.0 pg/mL  Troponin I (High Sensitivity)     Status: None   Collection Time: 10/26/22 11:10 AM  Result Value Ref Range   Troponin I (High Sensitivity) 4 <18 ng/L  Lipase, blood     Status: None   Collection Time: 10/26/22 11:10 AM  Result Value Ref Range   Lipase 27 11 - 51 U/L   CXR: There are no signs of pulmonary edema or focal pulmonary consolidation.  Assessment and Plan: COPD exacerbation     - place obs, tele     - continue steroids, nebs     - add guaifenesin     - wean O2 as able     - COVID  negative  Tobacco abuse Marijuana abuse     - counsel against further use     - nicotine patch  GERD     - PPI  GAD     - PRN ativan  Advance Care Planning:   Code Status:FULL  Consults: None  Family Communication: None at bedside  Severity of Illness: The appropriate patient status for this patient is OBSERVATION. Observation status is judged to be reasonable and necessary in order to provide the required intensity of service to ensure the patient's safety. The patient's presenting symptoms, physical exam findings, and initial radiographic and laboratory data in the context of their medical condition is felt to place them at decreased risk for further clinical deterioration. Furthermore, it is anticipated that the patient will be medically stable for discharge from the hospital within 2 midnights of admission.   Author: Jonnie Finner, DO 10/26/2022 12:45 PM  For on call review [email or web address].

## 2022-10-26 NOTE — ED Provider Notes (Signed)
[facility] DEPT Provider Note   CSN: [id number] Arrival date & time: 10/26/22  1032     History  Chief Complaint  Patient presents with   Shortness of Breath    Kathleen [name] is a 61 y.o. female.   Shortness of Breath    62 year old female with medical history significant for asthma, COPD who presents to the emergency department in respiratory distress.  The patient states that she has had a cough productive of sputum for the past 3 days with associated chills.  Has been trying DuoNebs at home without improvement.  EMS administered 1 DuoNeb and 1 albuterol neb, 125 mg Solu-Medrol, 1 g of magnesium. She arrived in respiratory distress, tachycardic and tachypneic, GCS 15.  Home Medications Prior to Admission medications   Medication Sig Start Date End Date Taking? Authorizing Provider  albuterol (VENTOLIN HFA) 108 (90 Base) MCG/ACT inhaler Inhale 2 puffs into the lungs every 6 (six) hours as needed for wheezing or shortness of breath. 10/06/22   Katsadouros, Vasilios, MD  EPINEPHrine (EPIPEN 2-PAK) 0.3 mg/0.3 mL IJ SOAJ injection Inject 0.3 mg into the muscle as needed for anaphylaxis. 07/30/22   Lacinda Axon, MD  Fluticasone-Umeclidin-Vilant (TRELEGY ELLIPTA) 200-62.5-25 MCG/ACT AEPB Inhale 1 puff into the lungs daily. 08/20/22   Hosie Poisson, MD  guaiFENesin-dextromethorphan (ROBITUSSIN DM) 100-10 MG/5ML syrup Take 5 mLs by mouth every 4 (four) hours as needed for cough. 10/24/22   Farrel Gordon, DO  hydrOXYzine (ATARAX) 25 MG tablet Take 1 tablet (25 mg total) by mouth 3 (three) times daily as needed for anxiety. 07/28/22   Lacinda Axon, MD  ipratropium-albuterol (DUONEB) 0.5-2.5 (3) MG/3ML SOLN Inhale 3 mLs by nebulization every 6 (six) hours as needed (Wheezing or shortness of breath). 10/06/22   Katsadouros, Vasilios, MD  lidocaine (LIDODERM) 5 % Place 1 patch onto the skin daily. Remove & Discard patch within 12 hours or as directed by MD  07/30/22   Lacinda Axon, MD  loratadine (CLARITIN) 10 MG tablet Take 1 tablet (10 mg total) by mouth daily. 03/09/22   Hunsucker, [name], MD  montelukast (SINGULAIR) 10 MG tablet Take 1 tablet (10 mg total) by mouth at bedtime. 07/30/22   Lacinda Axon, MD  Multiple Vitamin (MULTIVITAMIN WITH MINERALS) TABS tablet Take 1 tablet by mouth daily. 08/20/22   Hosie Poisson, MD  nicotine (NICODERM CQ - DOSED IN MG/24 HOURS) 14 mg/24hr patch Place 1 patch (14 mg total) onto the skin daily. 08/20/22   Hosie Poisson, MD  ondansetron (ZOFRAN) 4 MG tablet TAKE 1 TABLET BY MOUTH ONCE DAILY AS NEEDED FOR NAUSEA FOR VOMITING Patient taking differently: Take 4 mg by mouth daily as needed for nausea or vomiting. 02/18/21   [name], MD  oxyCODONE-acetaminophen (PERCOCET/ROXICET) 5-325 MG tablet Take 1-2 tablets by mouth every 8 (eight) hours as needed for severe pain. 08/30/22   Davonna Belling, MD  pantoprazole (PROTONIX) 40 MG tablet Take 1 tablet (40 mg total) by mouth daily. 08/20/22   Hosie Poisson, MD  predniSONE (DELTASONE) 20 MG tablet Take 2 tablets (40 mg total) by mouth daily with breakfast for 5 days 10/24/22   Farrel Gordon, DO  SPIRIVA HANDIHALER 18 MCG inhalation capsule Place 1 capsule (18 mcg total) into inhaler and inhale daily. 08/20/22   Hosie Poisson, MD      Allergies    Bee venom, Peach [prunus persica], Topamax [topiramate], and Tramadol    Review of Systems   Review of  Systems  Unable to perform ROS: Acuity of condition  Respiratory:  Positive for shortness of breath.     Physical Exam Updated Vital Signs BP (!) 103/92   Pulse (!) 116   Temp 98.4 F (36.9 C) (Oral)   Resp (!) 23   Ht '5\' 3"'$  (1.6 m)   Wt 63 kg   LMP 11/08/2014   SpO2 95%   BMI 24.62 kg/m  Physical Exam Vitals and nursing note reviewed.  Constitutional:      General: She is in acute distress.     Appearance: She is well-developed.  HENT:     Head: Normocephalic and atraumatic.  Eyes:      Conjunctiva/sclera: Conjunctivae normal.  Cardiovascular:     Rate and Rhythm: Regular rhythm. Tachycardia present.  Pulmonary:     Effort: Tachypnea, accessory muscle usage and respiratory distress present.     Breath sounds: Examination of the right-upper field reveals wheezing. Examination of the left-upper field reveals wheezing. Examination of the right-middle field reveals wheezing. Examination of the left-middle field reveals wheezing. Examination of the right-lower field reveals wheezing. Examination of the left-lower field reveals wheezing. Wheezing present.  Abdominal:     Palpations: Abdomen is soft.     Tenderness: There is no abdominal tenderness.  Musculoskeletal:        General: No swelling.     Cervical back: Neck supple.  Skin:    General: Skin is warm and dry.     Capillary Refill: Capillary refill takes less than 2 seconds.  Neurological:     Mental Status: She is alert.  Psychiatric:        Mood and Affect: Mood normal.     ED Results / Procedures / Treatments   Labs (all labs ordered are listed, but only abnormal results are displayed) Labs Reviewed  BASIC METABOLIC PANEL - Abnormal; Notable for the following components:      Result Value   Glucose, Bld 107 (*)    Creatinine, Ser 1.02 (*)    All other components within normal limits  CBC WITH DIFFERENTIAL/PLATELET - Abnormal; Notable for the following components:   WBC 11.9 (*)    RBC 5.40 (*)    Hemoglobin 15.2 (*)    HCT 46.7 (*)    Neutro Abs 8.6 (*)    Eosinophils Absolute 0.6 (*)    All other components within normal limits  BLOOD GAS, VENOUS - Abnormal; Notable for the following components:   pO2, Ven 58 (*)    All other components within normal limits  RESP PANEL BY RT-PCR (FLU A&B, COVID) ARPGX2  BRAIN NATRIURETIC PEPTIDE  LIPASE, BLOOD  TROPONIN I (HIGH SENSITIVITY)    EKG EKG Interpretation  Date/Time:  Tuesday October 26 2022 11:05:19 EST Ventricular Rate:  124 PR  Interval:  140 QRS Duration: 73 QT Interval:  339 QTC Calculation: 487 R Axis:   -73 Text Interpretation: Sinus tachycardia LAD, consider left anterior fascicular block RSR' in V1 or V2, right VCD or RVH ST elevation, consider inferior injury Borderline prolonged QT interval Confirmed by Regan Lemming ([id number]) on 10/26/2022 12:35:16 PM  Radiology DG Chest Port 1 View  Result Date: 10/26/2022 CLINICAL DATA:  Cough, wheezing EXAM: PORTABLE CHEST 1 VIEW COMPARISON:  08/30/2022 FINDINGS: Cardiac size is within normal limits. Low position of diaphragms may be due to COPD. There are no signs of pulmonary edema or focal pulmonary consolidation. There is no pleural effusion or pneumothorax. IMPRESSION: There are no signs of pulmonary edema  or focal pulmonary consolidation. Electronically Signed   By: Elmer Picker M.D.   On: 10/26/2022 11:52    Procedures Procedures    Medications Ordered in ED Medications  albuterol (PROVENTIL) (2.5 MG/3ML) 0.083% nebulizer solution (10 mg/hr Nebulization Given 10/26/22 1056)  magnesium sulfate IVPB 2 g 50 mL (0 g Intravenous Stopped 10/26/22 1212)  LORazepam (ATIVAN) injection 1 mg (1 mg Intravenous Given 10/26/22 1212)    ED Course/ Medical Decision Making/ A&P                           Medical Decision Making Amount and/or Complexity of Data Reviewed Labs: ordered. Radiology: ordered.  Risk Prescription drug management. Decision regarding hospitalization.    61 year old female with medical history significant for asthma, COPD who presents to the emergency department in respiratory distress.  The patient states that she has had a cough productive of sputum for the past 3 days with associated chills.  Has been trying DuoNebs at home without improvement.  EMS administered 1 DuoNeb and 1 albuterol neb, 125 mg Solu-Medrol, 1 g of magnesium. She arrived in respiratory distress, tachycardic and tachypneic, GCS 15.  On arrival, the patient was in  respiratory distress with a nonrebreather in place.  Afebrile, tachycardic P120, tachypneic RR 34, BP 120/88, saturating 99% on a nonrebreather.  Physical exam concerning for respiratory distress, tachypnea, diffuse expiratory wheezing present in all lung fields.  She has had worsening symptoms for the past couple of days, has a history of admission for COPD exacerbation.  No fevers at home, did endorse some chills with a productive cough.  Differential diagnosis includes COPD exacerbation, pneumonia, viral URI, COVID-19, PTX.  Lower suspicion for acute PE.  EKG was performed which revealed nonspecific ST segment changes.  The patient denies any active chest pain at this time and her troponin initially was 4.  Her BNP is normal, she had mild epigastric tenderness to palpation which she states is from coughing and straining her diaphragm from coughing.  She had a normal lipase.  Her VBG revealed pH of 7.33, PCO2 of 50, bicarbonate 26, CBC with a leukocytosis to 11.9, evidence of mild hemoconcentration with a hemoglobin of 15.2, COVID-19 and influenza PCR testing collected and negative.  The patient was placed on continuous albuterol nebulizer for around an hour and a half.  She was administered 1 mg of IV Ativan in addition to 2 g of magnesium.  A chest x-ray was performed which revealed no acute cardiac or pulm abnormality, no focal consolidation to suggest pneumonia, no evidence of pneumothorax.  Patient will likely need admission for further treatment in the hospital.  Overall has been off continuous and has remained stable, symptomatically appears improved, persistent mild tachypnea and expiratory wheezing present. Hospitalist medicine consult for admission, Dr. Marylyn Ishihara accepting.   Final Clinical Impression(s) / ED Diagnoses Final diagnoses:  COPD exacerbation [facility])    Rx / DC Orders ED Discharge Orders     None         Regan Lemming, MD 10/26/22 1248

## 2022-10-26 NOTE — ED Notes (Signed)
Pt sitting up in bed, pt states that she feels better, pt satting 95% on 4L via [place], pt requests food. Food given after md approval

## 2022-10-26 NOTE — ED Notes (Signed)
Pt has eaten her meal tray, pt talking in full sentences, resps even and unlabored

## 2022-10-27 DIAGNOSIS — G8929 Other chronic pain: Secondary | ICD-10-CM | POA: Diagnosis present

## 2022-10-27 DIAGNOSIS — F319 Bipolar disorder, unspecified: Secondary | ICD-10-CM | POA: Diagnosis present

## 2022-10-27 DIAGNOSIS — Z79899 Other long term (current) drug therapy: Secondary | ICD-10-CM | POA: Diagnosis not present

## 2022-10-27 DIAGNOSIS — E785 Hyperlipidemia, unspecified: Secondary | ICD-10-CM | POA: Diagnosis present

## 2022-10-27 DIAGNOSIS — J449 Chronic obstructive pulmonary disease, unspecified: Secondary | ICD-10-CM | POA: Diagnosis present

## 2022-10-27 DIAGNOSIS — M545 Low back pain, unspecified: Secondary | ICD-10-CM | POA: Diagnosis present

## 2022-10-27 DIAGNOSIS — G4733 Obstructive sleep apnea (adult) (pediatric): Secondary | ICD-10-CM | POA: Diagnosis present

## 2022-10-27 DIAGNOSIS — Z888 Allergy status to other drugs, medicaments and biological substances status: Secondary | ICD-10-CM | POA: Diagnosis not present

## 2022-10-27 DIAGNOSIS — Z91018 Allergy to other foods: Secondary | ICD-10-CM | POA: Diagnosis not present

## 2022-10-27 DIAGNOSIS — J9601 Acute respiratory failure with hypoxia: Secondary | ICD-10-CM | POA: Diagnosis present

## 2022-10-27 DIAGNOSIS — K219 Gastro-esophageal reflux disease without esophagitis: Secondary | ICD-10-CM | POA: Diagnosis present

## 2022-10-27 DIAGNOSIS — Z1152 Encounter for screening for COVID-19: Secondary | ICD-10-CM | POA: Diagnosis not present

## 2022-10-27 DIAGNOSIS — Z7951 Long term (current) use of inhaled steroids: Secondary | ICD-10-CM | POA: Diagnosis not present

## 2022-10-27 DIAGNOSIS — F1210 Cannabis abuse, uncomplicated: Secondary | ICD-10-CM | POA: Diagnosis present

## 2022-10-27 DIAGNOSIS — F17210 Nicotine dependence, cigarettes, uncomplicated: Secondary | ICD-10-CM | POA: Diagnosis present

## 2022-10-27 DIAGNOSIS — F411 Generalized anxiety disorder: Secondary | ICD-10-CM | POA: Diagnosis present

## 2022-10-27 DIAGNOSIS — J441 Chronic obstructive pulmonary disease with (acute) exacerbation: Secondary | ICD-10-CM | POA: Diagnosis present

## 2022-10-27 DIAGNOSIS — Z91030 Bee allergy status: Secondary | ICD-10-CM | POA: Diagnosis not present

## 2022-10-27 LAB — COMPREHENSIVE METABOLIC PANEL
ALT: 26 U/L (ref 0–44)
AST: 26 U/L (ref 15–41)
Albumin: 3.4 g/dL — ABNORMAL LOW (ref 3.5–5.0)
Alkaline Phosphatase: 60 U/L (ref 38–126)
Anion gap: 5 (ref 5–15)
BUN: 17 mg/dL (ref 8–23)
CO2: 26 mmol/L (ref 22–32)
Calcium: 8.9 mg/dL (ref 8.9–10.3)
Chloride: 107 mmol/L (ref 98–111)
Creatinine, Ser: 0.86 mg/dL (ref 0.44–1.00)
GFR, Estimated: >60 mL/min (ref >60)
Glucose, Bld: 143 mg/dL — ABNORMAL HIGH (ref 70–99)
Potassium: 4.4 mmol/L (ref 3.5–5.1)
Sodium: 138 mmol/L (ref 135–145)
Total Bilirubin: 0.5 mg/dL (ref 0.3–1.2)
Total Protein: 6.4 g/dL — ABNORMAL LOW (ref 6.5–8.1)

## 2022-10-27 LAB — CBC
HCT: 44 % (ref 36.0–46.0)
Hemoglobin: 14.2 g/dL (ref 12.0–15.0)
MCH: 28.1 pg (ref 26.0–34.0)
MCHC: 32.3 g/dL (ref 30.0–36.0)
MCV: 87.1 fL (ref 80.0–100.0)
Platelets: 289 10*3/uL (ref 150–400)
RBC: 5.05 MIL/uL (ref 3.87–5.11)
RDW: 13.6 % (ref 11.5–15.5)
WBC: 13.7 10*3/uL — ABNORMAL HIGH (ref 4.0–10.5)
nRBC: 0 % (ref 0.0–0.2)

## 2022-10-27 MED ORDER — ALBUTEROL SULFATE (2.5 MG/3ML) 0.083% IN NEBU
2.5000 mg | INHALATION_SOLUTION | Freq: Four times a day (QID) | RESPIRATORY_TRACT | Status: DC | PRN
  Administered 2022-10-28: 2.5 mg via RESPIRATORY_TRACT
  Filled 2022-10-27: qty 3

## 2022-10-27 MED ORDER — ALBUTEROL SULFATE HFA 108 (90 BASE) MCG/ACT IN AERS: 1.0000 | INHALATION_SPRAY | Freq: Four times a day (QID) | RESPIRATORY_TRACT | Status: DC | PRN

## 2022-10-27 MED ORDER — OXYCODONE-ACETAMINOPHEN 5-325 MG PO TABS
1.0000 | ORAL_TABLET | Freq: Once | ORAL | Status: AC
  Administered 2022-10-27: 1 via ORAL
  Filled 2022-10-27: qty 1

## 2022-10-27 NOTE — Progress Notes (Signed)
Supportive Care consult to provide services for patient.

## 2022-10-27 NOTE — Progress Notes (Signed)
PROGRESS NOTE    Kathleen [name]  KAJ:[id number] DOB: 1961/03/11 DOA: 10/26/2022  PCP: Riesa Pope, MD   Brief Narrative:  This 61 years old female with PMH significant for tobacco abuse, COPD, GERD presented in the ED with worsening shortness of breath.  She reports her symptoms started 3 days ago with cough and  later has developed worsening shortness of breath. She denies any sick contacts, fever, nausea, vomiting, diarrhea.  She has been using her regular inhalers without any improvement.  Her symptoms did not improve so she decided to come to the ED. Patient is admitted for COPD exacerbation.  Assessment & Plan:   Active Problems:   Generalized anxiety disorder   GERD   Tobacco abuse   COPD with acute exacerbation (HCC)   Marijuana abuse   COPD exacerbation (HCC)   COPD (chronic obstructive pulmonary disease) (HCC)  Acute hypoxic respiratory failure: COPD exacerbation: Patient presented in the ED with cough and worsening shortness of breath.   She was hypoxic with SPO2 of 87% on room air requiring 4 L of oxygen to maintain saturation above 94%. She is successfully weaned down to 2 L, SPO2 94%. Continue IV Solu-Medrol, transition to prednisone from tomorrow Continue scheduled and as needed nebulized bronchodilators Continue supplemental oxygen and wean as tolerated COVID, influenza negative  Tobacco abuse: Smoking cessation counseling completed. Continue nicotine patch  Marijuana use: Counseled against further use  GERD: Continue pantoprazole 40 mg daily  Generalized anxiety disorder: Continue lorazepam as needed.  DVT prophylaxis: Lovenox Code Status: Full code Family Communication: No family at bedside Disposition Plan:   Status is: Inpatient Remains inpatient appropriate because: Admitted for acute hypoxic respiratory failure secondary to COPD exacerbation requiring Solu-Medrol and bronchodilators.   Anticipated discharge home  10/28/2022.  Consultants:  None  Procedures: None Antimicrobials: Anti-infectives (From admission, onward)    None        Subjective: Patient was seen and examined at bedside.  Overnight events noted.   Patient reports doing better, she still remains on 2 L of supplemental oxygen,   Breathing has improved.  She denies any chest pain.  Objective: Vitals:   10/27/22 0304 10/27/22 0619 10/27/22 0638 10/27/22 1243  BP: 129/86 123/84  128/89  Pulse: 93 94  (!) 105  Resp: '20 20  18  '$ Temp: 98.4 F (36.9 C) 98.6 F (37 C)  98 F (36.7 C)  TempSrc: Oral Oral  Oral  SpO2: 97% 100% 94% 97%  Weight:      Height:       No intake or output data in the 24 hours ending 10/27/22 1321 Filed Weights   10/26/22 1113  Weight: 63 kg    Examination:  General exam: Appears comfortable, not in any acute distress.  Deconditioned Respiratory system: Wheezing bilaterally, normal respiratory effort, no accessory muscle use, RR 15 Cardiovascular system: S1 & S2 heard, regular rate and rhythm, no murmur. Gastrointestinal system: Abdomen is soft, non tender, non distended, BS+ Central nervous system: Alert and oriented X 3. No focal neurological deficits. Extremities: No edema, no cyanosis, no clubbing Skin: No rashes, lesions or ulcers Psychiatry: Judgement and insight appear normal. Mood & affect appropriate.     Data Reviewed: I have personally reviewed following labs and imaging studies  CBC: Recent Labs  Lab 10/26/22 1110 10/27/22 0427  WBC 11.9* 13.7*  NEUTROABS 8.6*  --   HGB 15.2* 14.2  HCT 46.7* 44.0  MCV 86.5 87.1  PLT 302 289   Basic  Metabolic Panel: Recent Labs  Lab 10/26/22 1110 10/27/22 0427  NA 142 138  K 3.7 4.4  CL 110 107  CO2 25 26  GLUCOSE 107* 143*  BUN 12 17  CREATININE 1.02* 0.86  CALCIUM 9.0 8.9   GFR: Estimated Creatinine Clearance: 61.5 mL/min (by C-G formula based on SCr of 0.86 mg/dL). Liver Function Tests: Recent Labs  Lab  10/27/22 0427  AST 26  ALT 26  ALKPHOS 60  BILITOT 0.5  PROT 6.4*  ALBUMIN 3.4*   Recent Labs  Lab 10/26/22 1110  LIPASE 27   No results for input(s): "AMMONIA" in the last 168 hours. Coagulation Profile: No results for input(s): "INR", "PROTIME" in the last 168 hours. Cardiac Enzymes: No results for input(s): "CKTOTAL", "CKMB", "CKMBINDEX", "TROPONINI" in the last 168 hours. BNP (last 3 results) No results for input(s): "PROBNP" in the last 8760 hours. HbA1C: No results for input(s): "HGBA1C" in the last 72 hours. CBG: No results for input(s): "GLUCAP" in the last 168 hours. Lipid Profile: No results for input(s): "CHOL", "HDL", "LDLCALC", "TRIG", "CHOLHDL", "LDLDIRECT" in the last 72 hours. Thyroid Function Tests: No results for input(s): "TSH", "T4TOTAL", "FREET4", "T3FREE", "THYROIDAB" in the last 72 hours. Anemia Panel: No results for input(s): "VITAMINB12", "FOLATE", "FERRITIN", "TIBC", "IRON", "RETICCTPCT" in the last 72 hours. Sepsis Labs: No results for input(s): "PROCALCITON", "LATICACIDVEN" in the last 168 hours.  Recent Results (from the past 240 hour(s))  Resp Panel by RT-PCR (Flu A&B, Covid) Anterior Nasal Swab     Status: None   Collection Time: 10/26/22 11:10 AM   Specimen: Anterior Nasal Swab  Result Value Ref Range Status   SARS Coronavirus 2 by RT PCR NEGATIVE NEGATIVE Final    Comment: (NOTE) SARS-CoV-2 target nucleic acids are NOT DETECTED.  The SARS-CoV-2 RNA is generally detectable in upper respiratory specimens during the acute phase of infection. The lowest concentration of SARS-CoV-2 viral copies this assay can detect is 138 copies/mL. A negative result does not preclude SARS-Cov-2 infection and should not be used as the sole basis for treatment or other patient management decisions. A negative result may occur with  improper specimen collection/handling, submission of specimen other than nasopharyngeal swab, presence of viral mutation(s)  within the areas targeted by this assay, and inadequate number of viral copies(<138 copies/mL). A negative result must be combined with clinical observations, patient history, and epidemiological information. The expected result is Negative.  Fact Sheet for Patients:  EntrepreneurPulse.com.au  Fact Sheet for Healthcare Providers:  IncredibleEmployment.be  This test is no t yet approved or cleared by the [place] FDA and  has been authorized for detection and/or diagnosis of SARS-CoV-2 by FDA under an Emergency Use Authorization (EUA). This EUA will remain  in effect (meaning this test can be used) for the duration of the COVID-19 declaration under Section 564(b)(1) of the Act, 21 U.S.C.section 360bbb-3(b)(1), unless the authorization is terminated  or revoked sooner.       Influenza A by PCR NEGATIVE NEGATIVE Final   Influenza B by PCR NEGATIVE NEGATIVE Final    Comment: (NOTE) The Xpert Xpress SARS-CoV-2/FLU/RSV plus assay is intended as an aid in the diagnosis of influenza from Nasopharyngeal swab specimens and should not be used as a sole basis for treatment. Nasal washings and aspirates are unacceptable for Xpert Xpress SARS-CoV-2/FLU/RSV testing.  Fact Sheet for Patients: EntrepreneurPulse.com.au  Fact Sheet for Healthcare Providers: IncredibleEmployment.be  This test is not yet approved or cleared by the [place] FDA and has  been authorized for detection and/or diagnosis of SARS-CoV-2 by FDA under an Emergency Use Authorization (EUA). This EUA will remain in effect (meaning this test can be used) for the duration of the COVID-19 declaration under Section 564(b)(1) of the Act, 21 U.S.C. section 360bbb-3(b)(1), unless the authorization is terminated or revoked.  Performed at [facility], [place] [address], [place], [place] [address]    Radiology Studies: [facility] Chest Port  1 View  Result Date: 10/26/2022 CLINICAL DATA:  Cough, wheezing EXAM: PORTABLE CHEST 1 VIEW COMPARISON:  08/30/2022 FINDINGS: Cardiac size is within normal limits. Low position of diaphragms may be due to COPD. There are no signs of pulmonary edema or focal pulmonary consolidation. There is no pleural effusion or pneumothorax. IMPRESSION: There are no signs of pulmonary edema or focal pulmonary consolidation. Electronically Signed   By: Elmer Picker M.D.   On: 10/26/2022 11:52    Scheduled Meds:  enoxaparin (LOVENOX) injection  40 mg Subcutaneous Q24H   guaiFENesin  600 mg Oral BID   ipratropium-albuterol  3 mL Nebulization Q4H   nicotine  14 mg Transdermal Daily   pantoprazole  40 mg Oral Daily   predniSONE  40 mg Oral Q breakfast   Continuous Infusions:   LOS: 0 days    Time spent: 50 mins    Kamaya Keckler, MD [facility]   If 7PM-7AM, please contact night-coverage

## 2022-10-27 NOTE — TOC Initial Note (Signed)
Transition of Care [facility]) - Initial/Assessment Note    Patient Details  Name: Kathleen [name] MRN: [id number] Date of Birth: 1961-02-19  Transition of Care [facility]) CM/SW Contact:    Dessa Phi, RN Phone Number: 10/27/2022, 1:51 PM  Clinical Narrative: SA resources added to AVS.                  Expected Discharge Plan: Home/Self Care Barriers to Discharge: Continued Medical Work up   Patient Goals and CMS Choice        Expected Discharge Plan and Services Expected Discharge Plan: Home/Self Care                                              Prior Living Arrangements/Services                       Activities of Daily Living Home Assistive Devices/Equipment: Nebulizer, Eyeglasses ADL Screening (condition at time of admission) Patient's cognitive ability adequate to safely complete daily activities?: Yes Is the patient deaf or have difficulty hearing?: No Does the patient have difficulty seeing, even when wearing glasses/contacts?: Yes (blurry vision) Does the patient have difficulty concentrating, remembering, or making decisions?: Yes Patient able to express need for assistance with ADLs?: Yes Does the patient have difficulty dressing or bathing?: Yes Independently performs ADLs?: No Communication: Independent Dressing (OT): Independent Grooming: Independent Feeding: Independent Bathing: Independent Toileting: Needs assistance Is this a change from baseline?: Change from baseline, expected to last >3days In/Out Bed: Needs assistance Is this a change from baseline?: Change from baseline, expected to last >3 days Walks in Home: Needs assistance Is this a change from baseline?: Change from baseline, expected to last >3 days Does the patient have difficulty walking or climbing stairs?: Yes Weakness of Legs: Both Weakness of Arms/Hands: Both  Permission Sought/Granted                  Emotional Assessment              Admission  diagnosis:  COPD exacerbation ([place]) [J44.1] COPD (chronic obstructive pulmonary disease) ([place]) [J44.9] Patient Active Problem List   Diagnosis Date Noted   COPD (chronic obstructive pulmonary disease) ([place]) 10/27/2022   COPD exacerbation ([place]) 10/26/2022   COPD with acute exacerbation ([place]) 08/17/2022   Marijuana abuse 08/17/2022   Weight loss 07/07/2022   Skin growth 08/28/2021   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   Daytime somnolence 04/06/2019   Lumbar radiculopathy 07/26/2016   Depression 09/19/2015   Preventative health care 06/18/2014   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

## 2022-10-28 LAB — CBC
HCT: 43.8 % (ref 36.0–46.0)
Hemoglobin: 13.8 g/dL (ref 12.0–15.0)
MCH: 27.9 pg (ref 26.0–34.0)
MCHC: 31.5 g/dL (ref 30.0–36.0)
MCV: 88.7 fL (ref 80.0–100.0)
Platelets: 253 10*3/uL (ref 150–400)
RBC: 4.94 MIL/uL (ref 3.87–5.11)
RDW: 13.7 % (ref 11.5–15.5)
WBC: 15.1 10*3/uL — ABNORMAL HIGH (ref 4.0–10.5)
nRBC: 0 % (ref 0.0–0.2)

## 2022-10-28 MED ORDER — DULERA 200-5 MCG/ACT IN AERO: 2.0000 | INHALATION_SPRAY | Freq: Two times a day (BID) | RESPIRATORY_TRACT | 0 refills | Status: DC

## 2022-10-28 MED ORDER — OXYCODONE HCL 5 MG PO TABS
5.0000 mg | ORAL_TABLET | Freq: Four times a day (QID) | ORAL | Status: DC | PRN
  Administered 2022-10-28: 5 mg via ORAL
  Filled 2022-10-28: qty 1

## 2022-10-28 MED ORDER — GUAIFENESIN ER 600 MG PO TB12: 600.0000 mg | ORAL_TABLET | Freq: Two times a day (BID) | ORAL | 0 refills | Status: AC

## 2022-10-28 MED ORDER — OXYCODONE HCL 5 MG PO TABS: 5.0000 mg | ORAL_TABLET | Freq: Four times a day (QID) | ORAL | 0 refills | Status: AC | PRN

## 2022-10-28 MED ORDER — GUAIFENESIN-DM 100-10 MG/5ML PO SYRP: 10.0000 mL | ORAL_SOLUTION | ORAL | 0 refills | Status: DC | PRN

## 2022-10-28 MED ORDER — PREDNISONE 10 MG PO TABS: ORAL_TABLET | ORAL | 0 refills | Status: DC

## 2022-10-28 NOTE — Progress Notes (Signed)
SATURATION QUALIFICATIONS: (This note is used to comply with regulatory documentation for home oxygen)  Patient Saturations on Room Air at Rest = 92%  Patient Saturations on Room Air while Ambulating = 89%  Patient Saturations on  Liters of oxygen while Ambulating = %  Please briefly explain why patient needs home oxygen:  Patient able to maintain O2 sats while ambulating halls at 89%
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
## 2022-11-03 ENCOUNTER — Telehealth: Payer: Self-pay | Admitting: Pulmonary Disease

## 2022-11-03 MED ORDER — ALBUTEROL SULFATE (2.5 MG/3ML) 0.083% IN NEBU: 2.5000 mg | INHALATION_SOLUTION | RESPIRATORY_TRACT | 6 refills | Status: DC | PRN

## 2022-11-03 NOTE — Telephone Encounter (Signed)
Called and spoke to patient and verified what medication she was needing refilled. Verified pharmacy with er as well. Nothing further needed

## 2022-11-03 NOTE — Telephone Encounter (Signed)
Called pt to get her set up for an overdue f/u as she was just seen in the ED. Scheduled for 11/29. States her pharmacy has been giving her the wrong medication for her nebulizer. Needs for [place] to send correct albuterol solution. She was describing what the packaging looks like, so please clarify exactly what she's needing as pt did not agree with the medication I read off the chart. Pharmacy is [facility] [address]

## 2022-11-10 ENCOUNTER — Ambulatory Visit: Payer: Commercial Managed Care - HMO | Admitting: Pulmonary Disease

## 2022-11-14 ENCOUNTER — Emergency Department (HOSPITAL_COMMUNITY): Payer: Commercial Managed Care - HMO

## 2022-11-14 ENCOUNTER — Inpatient Hospital Stay (HOSPITAL_COMMUNITY)
Admission: EM | Admit: 2022-11-14 | Discharge: 2022-11-17 | DRG: 191 | Disposition: A | Discharge status: Home / Self Care | Payer: Commercial Managed Care - HMO | Attending: Internal Medicine | Admitting: Internal Medicine

## 2022-11-14 ENCOUNTER — Encounter (HOSPITAL_COMMUNITY): Payer: Self-pay

## 2022-11-14 DIAGNOSIS — Z7982 Long term (current) use of aspirin: Secondary | ICD-10-CM

## 2022-11-14 DIAGNOSIS — E440 Moderate protein-calorie malnutrition: Secondary | ICD-10-CM | POA: Diagnosis present

## 2022-11-14 DIAGNOSIS — Z79899 Other long term (current) drug therapy: Secondary | ICD-10-CM

## 2022-11-14 DIAGNOSIS — J4489 Other specified chronic obstructive pulmonary disease: Secondary | ICD-10-CM

## 2022-11-14 DIAGNOSIS — Z7951 Long term (current) use of inhaled steroids: Secondary | ICD-10-CM

## 2022-11-14 DIAGNOSIS — Z6826 Body mass index (BMI) 26.0-26.9, adult: Secondary | ICD-10-CM

## 2022-11-14 DIAGNOSIS — J441 Chronic obstructive pulmonary disease with (acute) exacerbation: Secondary | ICD-10-CM | POA: Diagnosis not present

## 2022-11-14 DIAGNOSIS — M545 Low back pain, unspecified: Secondary | ICD-10-CM | POA: Diagnosis present

## 2022-11-14 DIAGNOSIS — R490 Dysphonia: Secondary | ICD-10-CM | POA: Diagnosis present

## 2022-11-14 DIAGNOSIS — F319 Bipolar disorder, unspecified: Secondary | ICD-10-CM | POA: Diagnosis present

## 2022-11-14 DIAGNOSIS — M19041 Primary osteoarthritis, right hand: Secondary | ICD-10-CM | POA: Diagnosis present

## 2022-11-14 DIAGNOSIS — Z91030 Bee allergy status: Secondary | ICD-10-CM

## 2022-11-14 DIAGNOSIS — Z91018 Allergy to other foods: Secondary | ICD-10-CM

## 2022-11-14 DIAGNOSIS — K219 Gastro-esophageal reflux disease without esophagitis: Secondary | ICD-10-CM

## 2022-11-14 DIAGNOSIS — F419 Anxiety disorder, unspecified: Secondary | ICD-10-CM | POA: Diagnosis present

## 2022-11-14 DIAGNOSIS — Z888 Allergy status to other drugs, medicaments and biological substances status: Secondary | ICD-10-CM

## 2022-11-14 DIAGNOSIS — Z885 Allergy status to narcotic agent status: Secondary | ICD-10-CM

## 2022-11-14 DIAGNOSIS — Z1152 Encounter for screening for COVID-19: Secondary | ICD-10-CM

## 2022-11-14 DIAGNOSIS — G8929 Other chronic pain: Secondary | ICD-10-CM | POA: Diagnosis present

## 2022-11-14 DIAGNOSIS — Z87892 Personal history of anaphylaxis: Secondary | ICD-10-CM

## 2022-11-14 DIAGNOSIS — M19042 Primary osteoarthritis, left hand: Secondary | ICD-10-CM | POA: Diagnosis present

## 2022-11-14 DIAGNOSIS — J439 Emphysema, unspecified: Secondary | ICD-10-CM | POA: Diagnosis present

## 2022-11-14 DIAGNOSIS — F17210 Nicotine dependence, cigarettes, uncomplicated: Secondary | ICD-10-CM | POA: Diagnosis present

## 2022-11-14 DIAGNOSIS — E785 Hyperlipidemia, unspecified: Secondary | ICD-10-CM | POA: Diagnosis present

## 2022-11-14 LAB — CBC WITH DIFFERENTIAL/PLATELET
Abs Immature Granulocytes: 0.01 10*3/uL (ref 0.00–0.07)
Basophils Absolute: 0.1 10*3/uL (ref 0.0–0.1)
Basophils Relative: 1 %
Eosinophils Absolute: 0.7 10*3/uL — ABNORMAL HIGH (ref 0.0–0.5)
Eosinophils Relative: 8 %
HCT: 44.4 % (ref 36.0–46.0)
Hemoglobin: 14 g/dL (ref 12.0–15.0)
Immature Granulocytes: 0 %
Lymphocytes Relative: 28 %
Lymphs Abs: 2.5 10*3/uL (ref 0.7–4.0)
MCH: 27.8 pg (ref 26.0–34.0)
MCHC: 31.5 g/dL (ref 30.0–36.0)
MCV: 88.3 fL (ref 80.0–100.0)
Monocytes Absolute: 0.7 10*3/uL (ref 0.1–1.0)
Monocytes Relative: 7 %
Neutro Abs: 5 10*3/uL (ref 1.7–7.7)
Neutrophils Relative %: 56 %
Platelets: 366 10*3/uL (ref 150–400)
RBC: 5.03 MIL/uL (ref 3.87–5.11)
RDW: 13.9 % (ref 11.5–15.5)
WBC: 9 10*3/uL (ref 4.0–10.5)
nRBC: 0 % (ref 0.0–0.2)

## 2022-11-14 LAB — COMPREHENSIVE METABOLIC PANEL
ALT: 29 U/L (ref 0–44)
AST: 28 U/L (ref 15–41)
Albumin: 3.5 g/dL (ref 3.5–5.0)
Alkaline Phosphatase: 63 U/L (ref 38–126)
Anion gap: 10 (ref 5–15)
BUN: 13 mg/dL (ref 8–23)
CO2: 23 mmol/L (ref 22–32)
Calcium: 8.7 mg/dL — ABNORMAL LOW (ref 8.9–10.3)
Chloride: 108 mmol/L (ref 98–111)
Creatinine, Ser: 1.2 mg/dL — ABNORMAL HIGH (ref 0.44–1.00)
GFR, Estimated: 51 mL/min — ABNORMAL LOW (ref >60)
Glucose, Bld: 117 mg/dL — ABNORMAL HIGH (ref 70–99)
Potassium: 3.8 mmol/L (ref 3.5–5.1)
Sodium: 141 mmol/L (ref 135–145)
Total Bilirubin: 0.6 mg/dL (ref 0.3–1.2)
Total Protein: 6.4 g/dL — ABNORMAL LOW (ref 6.5–8.1)

## 2022-11-14 LAB — BRAIN NATRIURETIC PEPTIDE: B Natriuretic Peptide: 36 pg/mL (ref 0.0–100.0)

## 2022-11-14 LAB — RESP PANEL BY RT-PCR (FLU A&B, COVID) ARPGX2
Influenza A by PCR: NEGATIVE
Influenza B by PCR: NEGATIVE
SARS Coronavirus 2 by RT PCR: NEGATIVE

## 2022-11-14 LAB — TROPONIN I (HIGH SENSITIVITY)
Troponin I (High Sensitivity): 3 ng/L (ref <18)
Troponin I (High Sensitivity): 3 ng/L (ref <18)

## 2022-11-14 MED ORDER — MOMETASONE FURO-FORMOTEROL FUM 200-5 MCG/ACT IN AERO
2.0000 | INHALATION_SPRAY | Freq: Two times a day (BID) | RESPIRATORY_TRACT | Status: DC
  Administered 2022-11-15 – 2022-11-17 (×5): 2 via RESPIRATORY_TRACT
  Filled 2022-11-14: qty 8.8

## 2022-11-14 MED ORDER — METHYLPREDNISOLONE SODIUM SUCC 40 MG IJ SOLR
40.0000 mg | Freq: Two times a day (BID) | INTRAMUSCULAR | Status: DC
  Administered 2022-11-14 – 2022-11-16 (×4): 40 mg via INTRAVENOUS
  Filled 2022-11-14 (×4): qty 1

## 2022-11-14 MED ORDER — ENOXAPARIN SODIUM 40 MG/0.4ML IJ SOSY
40.0000 mg | PREFILLED_SYRINGE | INTRAMUSCULAR | Status: DC
  Administered 2022-11-14 – 2022-11-16 (×3): 40 mg via SUBCUTANEOUS
  Filled 2022-11-14 (×3): qty 0.4

## 2022-11-14 MED ORDER — ALBUTEROL SULFATE (2.5 MG/3ML) 0.083% IN NEBU
2.5000 mg | INHALATION_SOLUTION | RESPIRATORY_TRACT | Status: DC | PRN
  Administered 2022-11-15 (×2): 2.5 mg via RESPIRATORY_TRACT
  Filled 2022-11-14 (×2): qty 3

## 2022-11-14 MED ORDER — ALBUTEROL SULFATE (2.5 MG/3ML) 0.083% IN NEBU
2.5000 mg | INHALATION_SOLUTION | Freq: Once | RESPIRATORY_TRACT | Status: AC
  Administered 2022-11-14: 2.5 mg via RESPIRATORY_TRACT
  Filled 2022-11-14: qty 3

## 2022-11-14 MED ORDER — ONDANSETRON HCL 4 MG/2ML IJ SOLN
4.0000 mg | Freq: Once | INTRAMUSCULAR | Status: AC
  Administered 2022-11-14: 4 mg via INTRAVENOUS
  Filled 2022-11-14: qty 2

## 2022-11-14 MED ORDER — METHOCARBAMOL 1000 MG/10ML IJ SOLN
500.0000 mg | Freq: Three times a day (TID) | INTRAVENOUS | Status: DC | PRN
  Administered 2022-11-14 – 2022-11-16 (×5): 500 mg via INTRAVENOUS
  Filled 2022-11-14: qty 5
  Filled 2022-11-14 (×4): qty 500

## 2022-11-14 MED ORDER — IPRATROPIUM-ALBUTEROL 0.5-2.5 (3) MG/3ML IN SOLN
3.0000 mL | Freq: Once | RESPIRATORY_TRACT | Status: AC
  Administered 2022-11-14: 3 mL via RESPIRATORY_TRACT
  Filled 2022-11-14: qty 3

## 2022-11-14 MED ORDER — MONTELUKAST SODIUM 10 MG PO TABS
10.0000 mg | ORAL_TABLET | Freq: Every day | ORAL | Status: DC
  Administered 2022-11-14 – 2022-11-16 (×3): 10 mg via ORAL
  Filled 2022-11-14 (×3): qty 1

## 2022-11-14 MED ORDER — KETOROLAC TROMETHAMINE 30 MG/ML IJ SOLN
15.0000 mg | Freq: Once | INTRAMUSCULAR | Status: AC
  Administered 2022-11-14: 15 mg via INTRAVENOUS
  Filled 2022-11-14 (×2): qty 1

## 2022-11-14 MED ORDER — LIDOCAINE 5 % EX PTCH
1.0000 | MEDICATED_PATCH | CUTANEOUS | Status: DC
  Administered 2022-11-15 – 2022-11-16 (×2): 1 via TRANSDERMAL
  Filled 2022-11-14 (×3): qty 1

## 2022-11-14 MED ORDER — ASPIRIN 81 MG PO TBEC
81.0000 mg | DELAYED_RELEASE_TABLET | Freq: Every day | ORAL | Status: DC
  Administered 2022-11-15 – 2022-11-17 (×3): 81 mg via ORAL
  Filled 2022-11-14 (×4): qty 1

## 2022-11-14 MED ORDER — NICOTINE 14 MG/24HR TD PT24
14.0000 mg | MEDICATED_PATCH | Freq: Every day | TRANSDERMAL | Status: DC
  Administered 2022-11-14 – 2022-11-17 (×4): 14 mg via TRANSDERMAL
  Filled 2022-11-14 (×4): qty 1

## 2022-11-14 MED ORDER — ACETAMINOPHEN 650 MG RE SUPP: 650.0000 mg | Freq: Four times a day (QID) | RECTAL | Status: DC | PRN

## 2022-11-14 MED ORDER — OXYCODONE HCL 5 MG PO TABS
5.0000 mg | ORAL_TABLET | ORAL | Status: DC | PRN
  Administered 2022-11-15 (×2): 5 mg via ORAL
  Filled 2022-11-14 (×3): qty 1

## 2022-11-14 MED ORDER — LEVALBUTEROL HCL 0.63 MG/3ML IN NEBU
0.6300 mg | INHALATION_SOLUTION | Freq: Four times a day (QID) | RESPIRATORY_TRACT | Status: DC
  Administered 2022-11-14 – 2022-11-17 (×12): 0.63 mg via RESPIRATORY_TRACT
  Filled 2022-11-14 (×12): qty 3

## 2022-11-14 MED ORDER — ACETAMINOPHEN 325 MG PO TABS
650.0000 mg | ORAL_TABLET | Freq: Four times a day (QID) | ORAL | Status: DC | PRN
  Administered 2022-11-15 – 2022-11-17 (×5): 650 mg via ORAL
  Filled 2022-11-14 (×4): qty 2

## 2022-11-14 NOTE — Progress Notes (Signed)
RT placed pt on BIPAP for WOB in ED.

## 2022-11-14 NOTE — Progress Notes (Signed)
Pt is resting well at this time. No need of bipap at this time.

## 2022-11-14 NOTE — ED Triage Notes (Signed)
[facility] reports pt coming from home, c/o sob. Been using her inhaler and nebs w/o relief, sob started yesterday and getting worse.

## 2022-11-14 NOTE — ED Notes (Signed)
Patient given dinner tray.

## 2022-11-14 NOTE — ED Notes (Signed)
Patient was offered tylenol for her pain. She stated that "was not strong enough." RN notified Dr. Wyline Copas about new orders.

## 2022-11-14 NOTE — H&P (Signed)
History and Physical    Patient: Kathleen [name] DOB: 04/22/1961 DOA: 11/14/2022 DOS: the patient was seen and examined on 11/14/2022 PCP: Riesa Pope, MD  Patient coming from: Home  Chief Complaint:  Chief Complaint  Patient presents with   Respiratory Distress   HPI: Kathleen [name] is a 62 y.o. female with medical history significant of bipolar 1, hx marijuana abuse, copd with frequent readmissions presents to ed with acute sob and wheezing.  In the ED, pt required bipap support. CXR with evidence of copd. Ultimately, O2 requiremetns improved with breathing tx and weaned to [facility]. Pt continued to wheeze. Hospitalist consulted for consideration for admission. Of note, pt noted to be tachycardic with HR in the 130's. Review of Systems: As mentioned in the history of present illness. All other systems reviewed and are negative. Past Medical History:  Diagnosis Date   Anxiety    Arthritis    hands   Asthma    2 sets of PFT's in 04/09 without sign variability. Last set with significant decrease in FEV1 with saline alone, suggesting Asthma but  recommended clinical corelation    Asthma    Bipolar 1 disorder [facility])    therapist is Joy and is followed by [facility]   Blackout    negative work up including ESR, ANA, opthalmology referral, carotid dopplers, 2D echo, MRI and EEG.   BREAST LUMP 03/25/2008   Annotation: bilaterally Qualifier: Diagnosis of  By: Tomasa Hosteller MD, Edmon Crape.    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 Further work up includes CRP, ESR, SPEP/UPEP, hepatitis erology, A1C , repeat ANA   COPD (chronic obstructive pulmonary disease) ([place])     COPD exacerbation ([place]) 03/31/2019   Depression    DUB (dysfunctional uterine bleeding)    and pelvic pain, with negative endometrial bx in 07/09 and transvaginal U/S significant for mild fibroids in 0/09.   Emphysema of lung ([place])    GERD (gastroesophageal reflux disease)    Hallucinations 03/18/2008   Qualifier: Diagnosis of  By: Redmond Pulling  MD, Mateo Flow     Hyperlipidemia    Lower extremity edema    Neg ABI's, normal 2D echo, normal albumin   Menorrhagia    Ovarian cyst    Polysubstance abuse ([facility])    none since March 17,2009.   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 She has a 7.50 pack-year smoking history. She has never used smokeless tobacco. She reports current alcohol use. She reports current drug use. Drug: Marijuana.  Allergies  Allergen Reactions   Bee Venom Anaphylaxis   Peach [Prunus Persica] Anaphylaxis, Swelling and Other (See Comments)    Throat swells , breaks out   Topamax [Topiramate] Other (See Comments)    Hallucinations    Tramadol Nausea Only    Family History  Problem Relation Age of Onset   Colon cancer Mother 54   Breast cancer Mother 70   Rectal cancer Mother    Diabetes Father    Hypertension Father    Kidney disease Father    Colon cancer Father 62   Prostate cancer Father    Cancer Father    Kidney disease Sister    Cancer Brother    Breast cancer Maternal Grandmother 103   Esophageal cancer Neg Hx    Stomach cancer Neg Hx     Prior to Admission medications   Medication Sig Start Date End Date Taking? Authorizing Provider  albuterol (PROVENTIL)  (2.5 MG/3ML) 0.083% nebulizer solution Take 3 mLs (2.5 mg total) by nebulization every 4 (four) hours as needed for wheezing or shortness of breath. 11/03/22   Hunsucker, [name], MD  albuterol (VENTOLIN HFA) 108 (90 Base) MCG/ACT inhaler Inhale 2 puffs into the lungs every 6 (six) hours as needed for wheezing or shortness of breath. Patient taking differently: Inhale 2 puffs into the lungs every 2 (two) hours as needed for wheezing or shortness of breath. 10/06/22   Riesa Pope, MD  aspirin EC 81 MG tablet Take 81 mg by mouth daily. Swallow whole.    [provider]  DULERA 200-5 MCG/ACT AERO Inhale 2 puffs into the lungs 2 (two) times daily. 10/28/22   Dahal, Marlowe Aschoff, MD  EPINEPHrine (EPIPEN 2-PAK) 0.3 mg/0.3 mL IJ SOAJ injection Inject 0.3 mg into the muscle as needed for anaphylaxis. 07/30/22   Lacinda Axon, MD  Fluticasone-Umeclidin-Vilant (TRELEGY ELLIPTA) 200-62.5-25 MCG/ACT AEPB Inhale 1 puff into the lungs daily. 08/20/22   Hosie Poisson, MD  guaiFENesin-dextromethorphan (ROBITUSSIN DM) 100-10 MG/5ML syrup Take 10 mLs by mouth every 4 (four) hours as needed for cough. 10/28/22   Terrilee Croak, MD  hydrOXYzine (ATARAX) 25 MG tablet Take 1 tablet (25 mg total) by mouth 3 (three) times daily as needed for anxiety. 07/28/22   Lacinda Axon, MD  ipratropium-albuterol (DUONEB) 0.5-2.5 (3) MG/3ML SOLN Inhale 3 mLs by nebulization every 6 (six) hours as needed (Wheezing or shortness of breath). 10/06/22   Katsadouros, Vasilios, MD  lidocaine (LIDODERM) 5 % Place 1 patch onto the skin daily. Remove & Discard patch within 12 hours or as directed by MD Patient taking differently: Place 2 patches onto the skin daily. Remove & Discard patch within 12 hours or as directed by MD 07/30/22   Lacinda Axon, MD  loratadine (CLARITIN) 10 MG tablet Take 1 tablet (10 mg total) by mouth daily. Patient taking differently: Take 10 mg by mouth at bedtime. 03/09/22   Hunsucker, [name],  MD  montelukast (SINGULAIR) 10 MG tablet Take 1 tablet (10 mg total) by mouth at bedtime. Patient not taking: Reported on 10/26/2022 07/30/22   Lacinda Axon, MD  Multiple Vitamin (MULTIVITAMIN WITH MINERALS) TABS tablet Take 1 tablet by mouth daily. 08/20/22   Hosie Poisson, MD  nicotine (NICODERM CQ - DOSED IN MG/24 HOURS) 14 mg/24hr patch Place 1 patch (14 mg total) onto the skin daily. Patient not taking: Reported on 10/26/2022 08/20/22   Hosie Poisson, MD  Nicotine (NICODERM CQ TD) Place 1 patch onto the skin daily as needed (  for smoking cessation while hospitalized).    [provider]  ondansetron (ZOFRAN) 4 MG tablet TAKE 1 TABLET BY MOUTH ONCE DAILY AS NEEDED FOR NAUSEA FOR VOMITING Patient taking differently: Take 4 mg by mouth daily as needed for nausea or vomiting. 02/18/21   [name], MD  pantoprazole (PROTONIX) 40 MG tablet Take 1 tablet (40 mg total) by mouth daily. Patient taking differently: Take 40 mg by mouth at bedtime. 08/20/22   Hosie Poisson, MD  predniSONE (DELTASONE) 10 MG tablet Take 4 tablets daily X 2 days, then, Take 3 tablets daily X 2 days, then, Take 2 tablets daily X 2 days, then, Take 1 tablets daily X 1 day. 10/28/22   Terrilee Croak, MD  SPIRIVA HANDIHALER 18 MCG inhalation capsule Place 1 capsule (18 mcg total) into inhaler and inhale daily. Patient taking differently: Place 18 mcg into inhaler and inhale 2 (two) times daily. 08/20/22   Hosie Poisson, MD    Physical Exam: Vitals:   11/14/22 1600 11/14/22 1618 11/14/22 1630 11/14/22 1700  BP: 108/64  123/75 114/80  Pulse: (!) 109 (!) 105 (!) 109 (!) 108  Resp: (!) _0 (!) 29  Temp:      TempSrc:      SpO2: 90% 91% 91% 91%   General exam: Awake, laying in bed, in nad Respiratory system: Normal respiratory effort, wheezing Cardiovascular system: regular rate, s1, s2 Gastrointestinal system: Soft, nondistended, positive BS Central nervous system: CN2-12 grossly intact, strength  intact Extremities: Perfused, no clubbing Skin: Normal skin turgor, no notable skin lesions seen Psychiatry: Mood normal // no visual hallucinations   Data Reviewed:  Labs reviewed: Na 141, K 3.8, Cr 1.2  Assessment and Plan: COPD exacerbation -Pt with recurrent episode of copd exacerbation -continue scheduled nebs and steroids -wean o2 as tolerated  Chronic smoker -cessation done face to face  Hx marijuana abuse -cessation done face to face  GERD -cont on PPI once medrec confirmed by pharmacy  Anxiety -seems stable at this time   Advance Care Planning:   Code Status: Full Code Full  Consults:   Family Communication: Pt in room  Severity of Illness: The appropriate patient status for this patient is OBSERVATION. Observation status is judged to be reasonable and necessary in order to provide the required intensity of service to ensure the patient's safety. The patient's presenting symptoms, physical exam findings, and initial radiographic and laboratory data in the context of their medical condition is felt to place them at decreased risk for further clinical deterioration. Furthermore, it is anticipated that the patient will be medically stable for discharge from the hospital within 2 midnights of admission.   Author: Marylu Lund, MD 11/14/2022 5:40 PM  For on call review [email or web address].

## 2022-11-14 NOTE — ED Notes (Signed)
Dr. Roderic Palau gave an order to turn off Bipap. Patient placed on 3L [place] at oxygen saturations at 93%. Patient also provided with crackers and peanut butter and a drink.

## 2022-11-14 NOTE — ED Provider Notes (Signed)
[place] DEPT Provider Note   CSN: [id number] Arrival date & time: 11/14/22  1353     History {Add pertinent medical, surgical, social history, OB history to HPI:1} Chief Complaint  Patient presents with   Respiratory Distress    Kathleen [name] is a 61 y.o. female.  Patient has a history of COPD.  She presents very short of breath.  She was brought in by paramedics and was given steroids albuterol Atrovent and magnesium.  When she arrived she was breathing about 60 times a minute   Shortness of Breath      Home Medications Prior to Admission medications   Medication Sig Start Date End Date Taking? Authorizing Provider  albuterol (PROVENTIL) (2.5 MG/3ML) 0.083% nebulizer solution Take 3 mLs (2.5 mg total) by nebulization every 4 (four) hours as needed for wheezing or shortness of breath. 11/03/22   Hunsucker, [name], MD  albuterol (VENTOLIN HFA) 108 (90 Base) MCG/ACT inhaler Inhale 2 puffs into the lungs every 6 (six) hours as needed for wheezing or shortness of breath. Patient taking differently: Inhale 2 puffs into the lungs every 2 (two) hours as needed for wheezing or shortness of breath. 10/06/22   Riesa Pope, MD  aspirin EC 81 MG tablet Take 81 mg by mouth daily. Swallow whole.    [provider]  DULERA 200-5 MCG/ACT AERO Inhale 2 puffs into the lungs 2 (two) times daily. 10/28/22   Dahal, Marlowe Aschoff, MD  EPINEPHrine (EPIPEN 2-PAK) 0.3 mg/0.3 mL IJ SOAJ injection Inject 0.3 mg into the muscle as needed for anaphylaxis. 07/30/22   Lacinda Axon, MD  Fluticasone-Umeclidin-Vilant (TRELEGY ELLIPTA) 200-62.5-25 MCG/ACT AEPB Inhale 1 puff into the lungs daily. 08/20/22   Hosie Poisson, MD  guaiFENesin-dextromethorphan (ROBITUSSIN DM) 100-10 MG/5ML syrup Take 10 mLs by mouth every 4 (four) hours as needed for cough. 10/28/22   Terrilee Croak, MD  hydrOXYzine (ATARAX) 25 MG tablet Take 1 tablet (25 mg total) by mouth 3 (three)  times daily as needed for anxiety. 07/28/22   Lacinda Axon, MD  ipratropium-albuterol (DUONEB) 0.5-2.5 (3) MG/3ML SOLN Inhale 3 mLs by nebulization every 6 (six) hours as needed (Wheezing or shortness of breath). 10/06/22   Katsadouros, Vasilios, MD  lidocaine (LIDODERM) 5 % Place 1 patch onto the skin daily. Remove & Discard patch within 12 hours or as directed by MD Patient taking differently: Place 2 patches onto the skin daily. Remove & Discard patch within 12 hours or as directed by MD 07/30/22   Lacinda Axon, MD  loratadine (CLARITIN) 10 MG tablet Take 1 tablet (10 mg total) by mouth daily. Patient taking differently: Take 10 mg by mouth at bedtime. 03/09/22   Hunsucker, [name], MD  montelukast (SINGULAIR) 10 MG tablet Take 1 tablet (10 mg total) by mouth at bedtime. Patient not taking: Reported on 10/26/2022 07/30/22   Lacinda Axon, MD  Multiple Vitamin (MULTIVITAMIN WITH MINERALS) TABS tablet Take 1 tablet by mouth daily. 08/20/22   Hosie Poisson, MD  nicotine (NICODERM CQ - DOSED IN MG/24 HOURS) 14 mg/24hr patch Place 1 patch (14 mg total) onto the skin daily. Patient not taking: Reported on 10/26/2022 08/20/22   Hosie Poisson, MD  Nicotine (NICODERM CQ TD) Place 1 patch onto the skin daily as needed (for smoking cessation while hospitalized).    [provider]  ondansetron (ZOFRAN) 4 MG tablet TAKE 1 TABLET BY MOUTH ONCE DAILY AS NEEDED FOR NAUSEA FOR VOMITING Patient taking differently: Take  4 mg by mouth daily as needed for nausea or vomiting. 02/18/21   [name], MD  pantoprazole (PROTONIX) 40 MG tablet Take 1 tablet (40 mg total) by mouth daily. Patient taking differently: Take 40 mg by mouth at bedtime. 08/20/22   Hosie Poisson, MD  predniSONE (DELTASONE) 10 MG tablet Take 4 tablets daily X 2 days, then, Take 3 tablets daily X 2 days, then, Take 2 tablets daily X 2 days, then, Take 1 tablets daily X 1 day. 10/28/22   Terrilee Croak, MD  SPIRIVA HANDIHALER  18 MCG inhalation capsule Place 1 capsule (18 mcg total) into inhaler and inhale daily. Patient taking differently: Place 18 mcg into inhaler and inhale 2 (two) times daily. 08/20/22   Hosie Poisson, MD      Allergies    Bee venom, Peach [prunus persica], Topamax [topiramate], and Tramadol    Review of Systems   Review of Systems  Respiratory:  Positive for shortness of breath.     Physical Exam Updated Vital Signs BP 123/75   Pulse (!) 109   Temp 98.8 F (37.1 C) (Oral)   Resp 20   LMP 11/08/2014   SpO2 91%  Physical Exam  ED Results / Procedures / Treatments   Labs (all labs ordered are listed, but only abnormal results are displayed) Labs Reviewed  CBC WITH DIFFERENTIAL/PLATELET - Abnormal; Notable for the following components:      Result Value   Eosinophils Absolute 0.7 (*)    All other components within normal limits  COMPREHENSIVE METABOLIC PANEL - Abnormal; Notable for the following components:   Glucose, Bld 117 (*)    Creatinine, Ser 1.20 (*)    Calcium 8.7 (*)    Total Protein 6.4 (*)    GFR, Estimated 51 (*)    All other components within normal limits  RESP PANEL BY RT-PCR (FLU A&B, COVID) ARPGX2  BRAIN NATRIURETIC PEPTIDE  TROPONIN I (HIGH SENSITIVITY)  TROPONIN I (HIGH SENSITIVITY)    EKG None  Radiology DG Chest Port 1 View  Result Date: 11/14/2022 CLINICAL DATA:  SOB chronic cough EXAM: PORTABLE CHEST - 1 VIEW COMPARISON:  10/26/2022. FINDINGS: The heart size and mediastinal contours are within normal limits. Lungs are hyperinflated suggesting COPD. There is no focal consolidation. No pneumothorax or pleural effusion. Aorta is calcified. The visualized skeletal structures are unremarkable. IMPRESSION: Findings suggest COPD.  Otherwise no active cardiopulmonary disease. Electronically Signed   By: Sammie Bench M.D.   On: 11/14/2022 15:42    Procedures Procedures  {Document cardiac monitor, telemetry assessment procedure when  appropriate:1}  Medications Ordered in ED Medications  ipratropium-albuterol (DUONEB) 0.5-2.5 (3) MG/3ML nebulizer solution 3 mL (has no administration in time range)  albuterol (PROVENTIL) (2.5 MG/3ML) 0.083% nebulizer solution 2.5 mg (has no administration in time range)  ipratropium-albuterol (DUONEB) 0.5-2.5 (3) MG/3ML nebulizer solution 3 mL (3 mLs Nebulization Given 11/14/22 1435)  albuterol (PROVENTIL) (2.5 MG/3ML) 0.083% nebulizer solution 2.5 mg (2.5 mg Nebulization Given 11/14/22 1435)  ondansetron (ZOFRAN) injection 4 mg (4 mg Intravenous Given 11/14/22 1452)  ketorolac (TORADOL) 30 MG/ML injection 15 mg (15 mg Intravenous Given 11/14/22 1452)    ED Course/ Medical Decision Making/ A&P  CRITICAL CARE Performed by: Milton Ferguson Total critical care time: 45 minutes Critical care time was exclusive of separately billable procedures and treating other patients. Critical care was necessary to treat or prevent imminent or life-threatening deterioration. Critical care was time spent personally by me on the following activities: development of  treatment plan with patient and/or surrogate as well as nursing, discussions with consultants, evaluation of patient's response to treatment, examination of patient, obtaining history from patient or surrogate, ordering and performing treatments and interventions, ordering and review of laboratory studies, ordering and review of radiographic studies, pulse oximetry and re-evaluation of patient's condition.    Patient was put on BiPAP and her breathing improved.  Eventually she was taken off of BiPAP and was doing well on 2 L.                         Medical Decision Making Amount and/or Complexity of Data Reviewed Labs: ordered. Radiology: ordered. ECG/medicine tests: ordered.  Risk Prescription drug management. Decision regarding hospitalization.   Patient with severe COPD exacerbation she will be admitted to medicine  {Document critical  care time when appropriate:1} {Document review of labs and clinical decision tools ie heart score, Chads2Vasc2 etc:1}  {Document your independent review of radiology images, and any outside records:1} {Document your discussion with family members, caretakers, and with consultants:1} {Document social determinants of health affecting pt's care:1} {Document your decision making why or why not admission, treatments were needed:1} Final Clinical Impression(s) / ED Diagnoses Final diagnoses:  COPD exacerbation ([place])    Rx / DC Orders ED Discharge Orders     None

## 2022-11-14 NOTE — ED Notes (Signed)
Patients son Darnelle Maffucci called per patient request and provided with an update.

## 2022-11-15 ENCOUNTER — Observation Stay (HOSPITAL_COMMUNITY): Payer: Commercial Managed Care - HMO

## 2022-11-15 ENCOUNTER — Observation Stay (HOSPITAL_BASED_OUTPATIENT_CLINIC_OR_DEPARTMENT_OTHER): Payer: Commercial Managed Care - HMO

## 2022-11-15 ENCOUNTER — Other Ambulatory Visit: Payer: Self-pay

## 2022-11-15 DIAGNOSIS — J441 Chronic obstructive pulmonary disease with (acute) exacerbation: Secondary | ICD-10-CM | POA: Diagnosis not present

## 2022-11-15 DIAGNOSIS — R52 Pain, unspecified: Secondary | ICD-10-CM | POA: Diagnosis not present

## 2022-11-15 LAB — COMPREHENSIVE METABOLIC PANEL
ALT: 25 U/L (ref 0–44)
AST: 23 U/L (ref 15–41)
Albumin: 3.7 g/dL (ref 3.5–5.0)
Alkaline Phosphatase: 71 U/L (ref 38–126)
Anion gap: 9 (ref 5–15)
BUN: 19 mg/dL (ref 8–23)
CO2: 24 mmol/L (ref 22–32)
Calcium: 9 mg/dL (ref 8.9–10.3)
Chloride: 104 mmol/L (ref 98–111)
Creatinine, Ser: 0.9 mg/dL (ref 0.44–1.00)
GFR, Estimated: >60 mL/min (ref >60)
Glucose, Bld: 123 mg/dL — ABNORMAL HIGH (ref 70–99)
Potassium: 4.5 mmol/L (ref 3.5–5.1)
Sodium: 137 mmol/L (ref 135–145)
Total Bilirubin: 0.4 mg/dL (ref 0.3–1.2)
Total Protein: 6.8 g/dL (ref 6.5–8.1)

## 2022-11-15 LAB — CBC
HCT: 43.9 % (ref 36.0–46.0)
Hemoglobin: 13.9 g/dL (ref 12.0–15.0)
MCH: 28.2 pg (ref 26.0–34.0)
MCHC: 31.7 g/dL (ref 30.0–36.0)
MCV: 89 fL (ref 80.0–100.0)
Platelets: 304 10*3/uL (ref 150–400)
RBC: 4.93 MIL/uL (ref 3.87–5.11)
RDW: 13.8 % (ref 11.5–15.5)
WBC: 7.3 10*3/uL (ref 4.0–10.5)
nRBC: 0 % (ref 0.0–0.2)

## 2022-11-15 MED ORDER — PANTOPRAZOLE SODIUM 40 MG IV SOLR
40.0000 mg | Freq: Two times a day (BID) | INTRAVENOUS | Status: DC
  Administered 2022-11-15 – 2022-11-16 (×2): 40 mg via INTRAVENOUS
  Filled 2022-11-15 (×3): qty 10

## 2022-11-15 MED ORDER — OXYCODONE HCL 5 MG PO TABS
5.0000 mg | ORAL_TABLET | Freq: Once | ORAL | Status: AC
  Administered 2022-11-15: 5 mg via ORAL
  Filled 2022-11-15: qty 1

## 2022-11-15 NOTE — Progress Notes (Signed)
  Progress Note   Patient: Kathleen [name] QPY:[id number] DOB: 03-07-1961 DOA: 11/14/2022     0 DOS: the patient was seen and examined on 11/15/2022   Brief hospital course:  61 y.o. female with medical history significant of bipolar 1, hx marijuana abuse, copd with frequent readmissions presents to ed with acute sob and wheezing.   In the ED, pt required bipap support. CXR with evidence of copd. Ultimately, O2 requiremetns improved with breathing tx and weaned to [facility]. Pt continued to wheeze. Hospitalist consulted for consideration for admission. Of note, pt noted to be tachycardic with HR in the 130's  Assessment and Plan: COPD exacerbation -Pt with recurrent episode of copd exacerbation -O2 requirements improving, still requiring 3L[place] -continue scheduled nebs and steroids -wean o2 as tolerated -See below. Pt does have acute hoarse voice that began 12/2, concerns for concurrent poorly treated GERD   Chronic smoker -cessation done face to face   Hx marijuana abuse -cessation done face to face -Pt reports having quit marijuana for quite a while   GERD -Pt reports compliance to PPI PTA -On futher questioning, reports "burning" chest pain with acidic taste in back of mouth assoicated with sob worse at night, awakening pt  -Reports typically laying down shortly after meal or taking meds -Currently with hoarse voice that began 12/2 -continue on BID IV protonix   Anxiety -seems stable at this time      Subjective: Complains of generalized pain over abd and chest, worse with coughing and deep breathing  Physical Exam: Vitals:   11/15/22 0715 11/15/22 0748 11/15/22 1327 11/15/22 1341  BP:    112/88  Pulse:    94  Resp: (!) 23   18  Temp:    99.2 F (37.3 C)  TempSrc:    Oral  SpO2: 93% 95% 95% 96%  Weight:      Height:       General exam: Awake, laying in bed, in nad Respiratory system: Normal respiratory effort, no wheezing Cardiovascular system: regular rate, s1,  s2 Gastrointestinal system: Soft, nondistended, positive BS Central nervous system: CN2-12 grossly intact, strength intact Extremities: Perfused, no clubbing Skin: Normal skin turgor, no notable skin lesions seen Psychiatry: Mood normal // no visual hallucinations   Data Reviewed:  Labs reviewed: Na 137, K 4.5, Cr 0.9, WBC 7.3, Hgb 13.9  Family Communication: Pt in room, family not at bedside  Disposition: Status is: Observation The patient remains OBS appropriate and will d/c before 2 midnights.  Planned Discharge Destination: Home   Author: Marylu Lund, MD 11/15/2022 5:00 PM  For on call review [email or web address].

## 2022-11-15 NOTE — Progress Notes (Signed)
Lower extremity venous duplex has been completed.   Preliminary results in CV Proc.   Kathleen [name] 11/15/2022 2:33 PM

## 2022-11-15 NOTE — Hospital Course (Signed)
61 y.o. female with medical history significant of bipolar 1, hx marijuana abuse, copd with frequent readmissions presents to ed with acute sob and wheezing.   In the ED, pt required bipap support. CXR with evidence of copd. Ultimately, O2 requiremetns improved with breathing tx and weaned to [facility]. Pt continued to wheeze. Hospitalist consulted for consideration for admission. Of note, pt noted to be tachycardic with HR in the 130's

## 2022-11-15 NOTE — Progress Notes (Signed)
Mobility Specialist - Progress Note  (3L [place]) Pre-mobility: 103 bpm HR, 94% SpO2 During mobility: 115 bpm HR, 91% SpO2 Post-mobility: 104 bpm HR,  94% SPO2   11/15/22 1146  Mobility  Activity Ambulated independently in hallway  Level of Assistance Standby assist, set-up cues, supervision of patient - no hands on  Assistive Device None  Distance Ambulated (ft) 150 ft  Range of Motion/Exercises Active  Activity Response Tolerated well  Mobility Referral Yes  $Mobility charge 1 Mobility   Pt was found in bed and agreeable to ambulate. Stated that she felt fatigued earlier going to the bathroom and back to bed. During ambulation had some coughing and became fatigued by EOS. At EOS returned to bed with necessities in reach and stated being concerned about blood clots and "neck stones". RN notified of patient's concerns.  Ferd Hibbs Mobility Specialist

## 2022-11-16 DIAGNOSIS — E440 Moderate protein-calorie malnutrition: Secondary | ICD-10-CM | POA: Diagnosis present

## 2022-11-16 DIAGNOSIS — Z888 Allergy status to other drugs, medicaments and biological substances status: Secondary | ICD-10-CM | POA: Diagnosis not present

## 2022-11-16 DIAGNOSIS — R490 Dysphonia: Secondary | ICD-10-CM | POA: Diagnosis present

## 2022-11-16 DIAGNOSIS — Z91030 Bee allergy status: Secondary | ICD-10-CM | POA: Diagnosis not present

## 2022-11-16 DIAGNOSIS — Z79899 Other long term (current) drug therapy: Secondary | ICD-10-CM | POA: Diagnosis not present

## 2022-11-16 DIAGNOSIS — F419 Anxiety disorder, unspecified: Secondary | ICD-10-CM | POA: Diagnosis present

## 2022-11-16 DIAGNOSIS — M19042 Primary osteoarthritis, left hand: Secondary | ICD-10-CM | POA: Diagnosis present

## 2022-11-16 DIAGNOSIS — Z6826 Body mass index (BMI) 26.0-26.9, adult: Secondary | ICD-10-CM | POA: Diagnosis not present

## 2022-11-16 DIAGNOSIS — M19041 Primary osteoarthritis, right hand: Secondary | ICD-10-CM | POA: Diagnosis present

## 2022-11-16 DIAGNOSIS — Z7951 Long term (current) use of inhaled steroids: Secondary | ICD-10-CM | POA: Diagnosis not present

## 2022-11-16 DIAGNOSIS — Z91018 Allergy to other foods: Secondary | ICD-10-CM | POA: Diagnosis not present

## 2022-11-16 DIAGNOSIS — F17210 Nicotine dependence, cigarettes, uncomplicated: Secondary | ICD-10-CM | POA: Diagnosis present

## 2022-11-16 DIAGNOSIS — K219 Gastro-esophageal reflux disease without esophagitis: Secondary | ICD-10-CM | POA: Diagnosis present

## 2022-11-16 DIAGNOSIS — F319 Bipolar disorder, unspecified: Secondary | ICD-10-CM | POA: Diagnosis present

## 2022-11-16 DIAGNOSIS — E785 Hyperlipidemia, unspecified: Secondary | ICD-10-CM | POA: Diagnosis present

## 2022-11-16 DIAGNOSIS — Z87892 Personal history of anaphylaxis: Secondary | ICD-10-CM | POA: Diagnosis not present

## 2022-11-16 DIAGNOSIS — Z885 Allergy status to narcotic agent status: Secondary | ICD-10-CM | POA: Diagnosis not present

## 2022-11-16 DIAGNOSIS — J439 Emphysema, unspecified: Secondary | ICD-10-CM | POA: Diagnosis present

## 2022-11-16 DIAGNOSIS — J441 Chronic obstructive pulmonary disease with (acute) exacerbation: Secondary | ICD-10-CM | POA: Diagnosis present

## 2022-11-16 DIAGNOSIS — Z7982 Long term (current) use of aspirin: Secondary | ICD-10-CM | POA: Diagnosis not present

## 2022-11-16 DIAGNOSIS — Z1152 Encounter for screening for COVID-19: Secondary | ICD-10-CM | POA: Diagnosis not present

## 2022-11-16 DIAGNOSIS — M545 Low back pain, unspecified: Secondary | ICD-10-CM | POA: Diagnosis present

## 2022-11-16 DIAGNOSIS — G8929 Other chronic pain: Secondary | ICD-10-CM | POA: Diagnosis present

## 2022-11-16 MED ORDER — POLYETHYLENE GLYCOL 3350 17 G PO PACK
17.0000 g | PACK | Freq: Every day | ORAL | Status: DC | PRN
  Administered 2022-11-16: 17 g via ORAL
  Filled 2022-11-16: qty 1

## 2022-11-16 MED ORDER — PANTOPRAZOLE SODIUM 40 MG PO TBEC
40.0000 mg | DELAYED_RELEASE_TABLET | Freq: Two times a day (BID) | ORAL | Status: DC
  Administered 2022-11-16 – 2022-11-17 (×2): 40 mg via ORAL
  Filled 2022-11-16 (×2): qty 1

## 2022-11-16 MED ORDER — PREDNISONE 20 MG PO TABS
40.0000 mg | ORAL_TABLET | Freq: Every day | ORAL | Status: DC
  Administered 2022-11-17: 40 mg via ORAL
  Filled 2022-11-16: qty 2

## 2022-11-16 NOTE — Progress Notes (Signed)
  Progress Note   Patient: Kathleen [name] ERD:[id number] DOB: 14-Sep-1961 DOA: 11/14/2022     0 DOS: the patient was seen and examined on 11/16/2022   Brief hospital course:  61 y.o. female with medical history significant of bipolar 1, hx marijuana abuse, copd with frequent readmissions presents to ed with acute sob and wheezing.   In the ED, pt required bipap support. CXR with evidence of copd. Ultimately, O2 requiremetns improved with breathing tx and weaned to [facility]. Pt continued to wheeze. Hospitalist consulted for consideration for admission. Of note, pt noted to be tachycardic with HR in the 130's  Assessment and Plan: COPD exacerbation -Pt with hx of recurrent copd exacerbations -O2 requirements improving, Initially required bipap, now weaned to [facility] this AM -continue scheduled nebs -Will wean steroids to prednisone beginning 12/5 -wean o2 as tolerated -See below. Pt does have acute hoarse voice that began 12/2, concerns for concurrent poorly treated GERD/reflux -This AM, complained of increased dyspnea when trying to ambulate to bathroom, had to catch her breath   Chronic smoker -Reports quitting tobacco on 11/27. Pt congratulated -Continue nicotine patch   Hx marijuana abuse -cessation done face to face -Pt reports having quit marijuana for quite a while   GERD -Pt reports compliance to PPI PTA -On futher questioning, reports "burning" chest pain with acidic taste in back of mouth assoicated with sob worse at night, awakening pt  -Reports typically laying down shortly after meal or taking meds -Remains with hoarse voice that began 12/2 -continue on BID PPI.  -Pt has reported some improvement already   Anxiety -seems stable at this time      Subjective: Reported feeling increased SOB this AM when trying to ambulate to bathroom.  Physical Exam: Vitals:   11/16/22 0730 11/16/22 0733 11/16/22 1340 11/16/22 1340  BP:    118/80  Pulse:    82  Resp:    16  Temp:    98.2  F (36.8 C)  TempSrc:    Oral  SpO2: (!) 89% 97% 98% 99%  Weight:      Height:       General exam: Conversant, in no acute distress Respiratory system: normal chest rise, clear, audible wheezing Cardiovascular system: regular rhythm, s1-s2 Gastrointestinal system: Nondistended, nontender, pos BS Central nervous system: No seizures, no tremors Extremities: No cyanosis, no joint deformities Skin: No rashes, no pallor Psychiatry: Affect normal // no auditory hallucinations   Data Reviewed:  There are no new results to review at this time.  Family Communication: Pt in room, family not at bedside  Disposition: Status is: Observation The patient will require care spanning > 2 midnights and should be moved to inpatient because: Severity of illness  Planned Discharge Destination: Home   Author: Marylu Lund, MD 11/16/2022 3:34 PM  For on call review [email or web address].

## 2022-11-17 DIAGNOSIS — E440 Moderate protein-calorie malnutrition: Secondary | ICD-10-CM | POA: Insufficient documentation

## 2022-11-17 DIAGNOSIS — K219 Gastro-esophageal reflux disease without esophagitis: Secondary | ICD-10-CM

## 2022-11-17 DIAGNOSIS — J441 Chronic obstructive pulmonary disease with (acute) exacerbation: Secondary | ICD-10-CM | POA: Diagnosis not present

## 2022-11-17 MED ORDER — PREDNISONE 10 MG PO TABS: ORAL_TABLET | ORAL | 0 refills | Status: DC

## 2022-11-17 MED ORDER — PANTOPRAZOLE SODIUM 40 MG PO TBEC: 40.0000 mg | DELAYED_RELEASE_TABLET | Freq: Two times a day (BID) | ORAL | 1 refills | Status: DC

## 2022-11-17 MED ORDER — OXYCODONE HCL 5 MG PO TABS: 5.0000 mg | ORAL_TABLET | ORAL | 0 refills | Status: DC | PRN

## 2022-11-17 MED ORDER — METHOCARBAMOL 500 MG PO TABS
500.0000 mg | ORAL_TABLET | Freq: Four times a day (QID) | ORAL | Status: DC | PRN
  Administered 2022-11-17: 500 mg via ORAL
  Filled 2022-11-17: qty 1

## 2022-11-17 MED ORDER — HYDROXYZINE HCL 25 MG PO TABS: 25.0000 mg | ORAL_TABLET | Freq: Three times a day (TID) | ORAL | 0 refills | Status: DC | PRN

## 2022-11-17 MED ORDER — TRELEGY ELLIPTA 200-62.5-25 MCG/ACT IN AEPB: 2.0000 | INHALATION_SPRAY | Freq: Every day | RESPIRATORY_TRACT | 1 refills | Status: DC

## 2022-11-17 MED ORDER — METHOCARBAMOL 500 MG PO TABS: 500.0000 mg | ORAL_TABLET | Freq: Four times a day (QID) | ORAL | 0 refills | Status: DC | PRN

## 2022-11-17 MED ORDER — OXYCODONE HCL 5 MG PO TABS
5.0000 mg | ORAL_TABLET | ORAL | Status: DC | PRN
  Administered 2022-11-17: 5 mg via ORAL
  Filled 2022-11-17: qty 1

## 2022-11-17 MED ORDER — ALBUTEROL SULFATE (2.5 MG/3ML) 0.083% IN NEBU: 2.5000 mg | INHALATION_SOLUTION | RESPIRATORY_TRACT | 6 refills | Status: DC | PRN

## 2022-11-17 MED ORDER — NICOTINE 21-14-7 MG/24HR TD KIT: PACK | TRANSDERMAL | 0 refills | Status: DC

## 2022-11-17 NOTE — Progress Notes (Signed)
  Transition of Care [facility]) Screening Note   Patient Details  Name: Kathleen [name] Date of Birth: 02-25-1961   Transition of Care [facility]) CM/SW Contact:    Illene Regulus, LCSW Phone Number: 11/17/2022, 3:31 PM    Transition of Care Department [facility]) has reviewed patient and no TOC needs have been identified at this time. We will continue to monitor patient advancement through interdisciplinary progression rounds. If new patient transition needs arise, please place a TOC consult.

## 2022-11-17 NOTE — Discharge Summary (Signed)
Physician Discharge Summary  Kathleen [name] MRN:[id number] DOB: 09/16/1961 DOA: 11/14/2022  PCP: Katsadouros, Vasilios, MD  Admit date: 11/14/2022 Discharge date: 11/17/2022  Admitted From: Home Disposition: Home  Recommendations for Outpatient Follow-up:  Follow up with PCP in 1-2 weeks Please obtain BMP/CBC in one week Follow-up with primary pulmonologist, Dr. Hunsucker   Discharge Condition: Stable CODE STATUS: Full code Diet recommendation: Heart healthy  Brief/Interim Summary: 61 y.o. female with medical history significant of bipolar 1, hx marijuana abuse, copd with frequent readmissions presents to ed with acute sob and wheezing.   In the ED, pt required bipap support. CXR with evidence of copd. Ultimately, O2 requiremetns improved with breathing tx and weaned to [facility]. Pt continued to wheeze. Hospitalist consulted for consideration for admission. Of note, pt noted to be tachycardic with HR in the 130'  Discharge Diagnoses:  Active Problems:   COPD exacerbation (HCC)   Malnutrition of moderate degree  COPD exacerbation -Pt with hx of recurrent copd exacerbations -O2 requirements improving, Initially required bipap, now weaned down to room air -continue scheduled nebs -IV steroids weaned to prednisone taper -See below. Pt does have acute hoarse voice that began 12/2, concerns for concurrent poorly treated GERD/reflux.  She is improving with PPI twice daily -Overall respiratory status appears to be approaching baseline.  Resume Trelegy on discharge, continue prednisone taper, continue albuterol as needed.  Follow-up with primary pulmonologist.   Chronic smoker -Reports quitting tobacco on 11/27. Pt congratulated -Continue nicotine patch   Hx marijuana abuse -cessation done face to face -Pt reports having quit marijuana for quite a while   GERD -Pt reports compliance to PPI PTA -On futher questioning, reports "burning" chest pain with acidic taste in back of mouth  assoicated with sob worse at night, awakening pt  -Reports typically laying down shortly after meal or taking meds -Remains with hoarse voice that began 12/2 -continue on BID PPI.  -Pt has reported some improvement already -Reviewed significant lifestyle changes to help manage GERD   Anxiety -seems stable at this time -Continue Atarax as needed  Discharge Instructions  Discharge Instructions     Diet - low sodium heart healthy   Complete by: As directed    Increase activity slowly   Complete by: As directed       Allergies as of 11/17/2022       Reactions   Bee Venom Anaphylaxis   Peach [prunus Persica] Anaphylaxis, Swelling, Other (See Comments)   Throat swells , breaks out   Hydroxyzine Other (See Comments)   Caused the patient to feel jittery   Topamax [topiramate] Other (See Comments)   Hallucinations    Tramadol Nausea Only        Medication List     STOP taking these medications    Dulera 200-5 MCG/ACT Aero Generic drug: mometasone-formoterol   ipratropium-albuterol 0.5-2.5 (3) MG/3ML Soln Commonly known as: DUONEB   NICODERM CQ TD   nicotine 14 mg/24hr patch Commonly known as: NICODERM CQ - dosed in mg/24 hours Replaced by: Nicotine 21-14-7 MG/24HR Kit   Spiriva HandiHaler 18 MCG inhalation capsule Generic drug: tiotropium       TAKE these medications    albuterol 108 (90 Base) MCG/ACT inhaler Commonly known as: VENTOLIN HFA Inhale 2 puffs into the lungs every 6 (six) hours as needed for wheezing or shortness of breath. What changed: when to take this   albuterol (2.5 MG/3ML) 0.083% nebulizer solution Commonly known as: PROVENTIL Take 3 mLs (2.5 mg total) by   nebulization every 4 (four) hours as needed for wheezing or shortness of breath. What changed: Another medication with the same name was changed. Make sure you understand how and when to take each.   aspirin EC 81 MG tablet Take 81 mg by mouth daily. Swallow whole.   EPINEPHrine 0.3  mg/0.3 mL Soaj injection Commonly known as: EpiPen 2-Pak Inject 0.3 mg into the muscle as needed for anaphylaxis.   guaiFENesin-dextromethorphan 100-10 MG/5ML syrup Commonly known as: ROBITUSSIN DM Take 10 mLs by mouth every 4 (four) hours as needed for cough.   hydrOXYzine 25 MG tablet Commonly known as: ATARAX Take 1 tablet (25 mg total) by mouth 3 (three) times daily as needed for anxiety.   lidocaine 5 % Commonly known as: LIDODERM Place 1 patch onto the skin daily. Remove & Discard patch within 12 hours or as directed by MD What changed:  how much to take when to take this additional instructions   loratadine 10 MG tablet Commonly known as: Claritin Take 1 tablet (10 mg total) by mouth daily. What changed: when to take this   methocarbamol 500 MG tablet Commonly known as: ROBAXIN Take 1 tablet (500 mg total) by mouth every 6 (six) hours as needed for muscle spasms.   montelukast 10 MG tablet Commonly known as: SINGULAIR Take 1 tablet (10 mg total) by mouth at bedtime.   Mucinex Fast-Max 10-650-400 MG/20ML Liqd Generic drug: Phenylephrine-APAP-guaiFENesin Take 15 mLs by mouth every 4 (four) hours as needed (to loosen phlegm).   multivitamin with minerals Tabs tablet Take 1 tablet by mouth daily.   Nicotine 21-14-7 MG/24HR Kit Take as prescribed Replaces: nicotine 14 mg/24hr patch   ondansetron 4 MG tablet Commonly known as: ZOFRAN TAKE 1 TABLET BY MOUTH ONCE DAILY AS NEEDED FOR NAUSEA FOR VOMITING What changed: See the new instructions.   oxyCODONE 5 MG immediate release tablet Commonly known as: Oxy IR/ROXICODONE Take 1 tablet (5 mg total) by mouth every 4 (four) hours as needed for severe pain.   pantoprazole 40 MG tablet Commonly known as: PROTONIX Take 1 tablet (40 mg total) by mouth 2 (two) times daily before a meal. What changed: when to take this   predniSONE 10 MG tablet Commonly known as: DELTASONE Take 40mg po daily for 2 days then 30mg daily  for 2 days then 20mg daily for 2 days then 10mg daily for 2 days then stop What changed: additional instructions   Trelegy Ellipta 200-62.5-25 MCG/ACT Aepb Generic drug: Fluticasone-Umeclidin-Vilant Inhale 2 puffs into the lungs daily.        Allergies  Allergen Reactions   Bee Venom Anaphylaxis   Peach [Prunus Persica] Anaphylaxis, Swelling and Other (See Comments)    Throat swells , breaks out   Hydroxyzine Other (See Comments)    Caused the patient to feel jittery   Topamax [Topiramate] Other (See Comments)    Hallucinations    Tramadol Nausea Only    Consultations:    Procedures/Studies: VAS US LOWER EXTREMITY VENOUS (DVT)  Result Date: 11/15/2022  Lower Venous DVT Study Patient Name:  Kathleen [name]  Date of Exam:   11/15/2022 Medical Rec #: [id number]      Accession #:    [id number] Date of Birth: 11/21/1961     Patient Gender: F Patient Age:   61 years Exam Location:  [facility] Procedure:      VAS US LOWER EXTREMITY VENOUS (DVT) Referring Phys: STEPHEN CHIU --------------------------------------------------------------------------------  Indications: Pain.  Comparison Study: no   prior Performing Technologist: Megan Stricklin RVS  Examination Guidelines: A complete evaluation includes B-mode imaging, spectral Doppler, color Doppler, and power Doppler as needed of all accessible portions of each vessel. Bilateral testing is considered an integral part of a complete examination. Limited examinations for reoccurring indications may be performed as noted. The reflux portion of the exam is performed with the patient in reverse Trendelenburg.  +-----+---------------+---------+-----------+----------+--------------+ RIGHTCompressibilityPhasicitySpontaneityPropertiesThrombus Aging +-----+---------------+---------+-----------+----------+--------------+ CFV  Full           Yes      Yes                                  +-----+---------------+---------+-----------+----------+--------------+   +---------+---------------+---------+-----------+----------+--------------+ LEFT     CompressibilityPhasicitySpontaneityPropertiesThrombus Aging +---------+---------------+---------+-----------+----------+--------------+ CFV      Full           Yes      Yes                                 +---------+---------------+---------+-----------+----------+--------------+ SFJ      Full                                                        +---------+---------------+---------+-----------+----------+--------------+ FV Prox  Full                                                        +---------+---------------+---------+-----------+----------+--------------+ FV Mid   Full                                                        +---------+---------------+---------+-----------+----------+--------------+ FV DistalFull                                                        +---------+---------------+---------+-----------+----------+--------------+ PFV      Full                                                        +---------+---------------+---------+-----------+----------+--------------+ POP      Full           Yes      Yes                                 +---------+---------------+---------+-----------+----------+--------------+ PTV      Full                                                        +---------+---------------+---------+-----------+----------+--------------+   PERO     Full                                                        +---------+---------------+---------+-----------+----------+--------------+     Summary: RIGHT: - No evidence of common femoral vein obstruction.  LEFT: - There is no evidence of deep vein thrombosis in the lower extremity.  - No cystic structure found in the popliteal fossa.  *See table(s) above for measurements and observations. Electronically signed  by Servando Snare MD on 11/15/2022 at 4:38:07 PM.    Final    DG Chest Port 1 View  Result Date: 11/14/2022 CLINICAL DATA:  SOB chronic cough EXAM: PORTABLE CHEST - 1 VIEW COMPARISON:  10/26/2022. FINDINGS: The heart size and mediastinal contours are within normal limits. Lungs are hyperinflated suggesting COPD. There is no focal consolidation. No pneumothorax or pleural effusion. Aorta is calcified. The visualized skeletal structures are unremarkable. IMPRESSION: Findings suggest COPD.  Otherwise no active cardiopulmonary disease. Electronically Signed   By: Sammie Bench M.D.   On: 11/14/2022 15:42   DG Chest Port 1 View  Result Date: 10/26/2022 CLINICAL DATA:  Cough, wheezing EXAM: PORTABLE CHEST 1 VIEW COMPARISON:  08/30/2022 FINDINGS: Cardiac size is within normal limits. Low position of diaphragms may be due to COPD. There are no signs of pulmonary edema or focal pulmonary consolidation. There is no pleural effusion or pneumothorax. IMPRESSION: There are no signs of pulmonary edema or focal pulmonary consolidation. Electronically Signed   By: Elmer Picker M.D.   On: 10/26/2022 11:52      Subjective: She is feeling better today.  Able to ambulate on room air without significant shortness of breath.  Discharge Exam: Vitals:   11/17/22 0414 11/17/22 0740 11/17/22 1148 11/17/22 1339  BP: (!) 124/95  114/84   Pulse: 79  (!) 106   Resp: 16  16   Temp: 98.2 F (36.8 C)  98.4 F (36.9 C)   TempSrc: Oral  Oral   SpO2: 98% 98% 98% 98%  Weight:      Height:        General: Pt is alert, awake, not in acute distress Cardiovascular: RRR, S1/S2 +, no rubs, no gallops Respiratory: CTA bilaterally, no wheezing, no rhonchi Abdominal: Soft, NT, ND, bowel sounds + Extremities: no edema, no cyanosis    The results of significant diagnostics from this hospitalization (including imaging, microbiology, ancillary and laboratory) are listed below for reference.     Microbiology: Recent  Results (from the past 240 hour(s))  Resp Panel by RT-PCR (Flu A&B, Covid) Anterior Nasal Swab     Status: None   Collection Time: 11/14/22  5:36 PM   Specimen: Anterior Nasal Swab  Result Value Ref Range Status   SARS Coronavirus 2 by RT PCR NEGATIVE NEGATIVE Final    Comment: (NOTE) SARS-CoV-2 target nucleic acids are NOT DETECTED.  The SARS-CoV-2 RNA is generally detectable in upper respiratory specimens during the acute phase of infection. The lowest concentration of SARS-CoV-2 viral copies this assay can detect is 138 copies/mL. A negative result does not preclude SARS-Cov-2 infection and should not be used as the sole basis for treatment or other patient management decisions. A negative result may occur with  improper specimen collection/handling, submission of specimen other than nasopharyngeal swab, presence of viral mutation(s) within the  areas targeted by this assay, and inadequate number of viral copies(<138 copies/mL). A negative result must be combined with clinical observations, patient history, and epidemiological information. The expected result is Negative.  Fact Sheet for Patients:  https://www.fda.gov/media/152166/download  Fact Sheet for Healthcare Providers:  https://www.fda.gov/media/152162/download  This test is no t yet approved or cleared by the [place] FDA and  has been authorized for detection and/or diagnosis of SARS-CoV-2 by FDA under an Emergency Use Authorization (EUA). This EUA will remain  in effect (meaning this test can be used) for the duration of the COVID-19 declaration under Section 564(b)(1) of the Act, 21 U.S.C.section 360bbb-3(b)(1), unless the authorization is terminated  or revoked sooner.       Influenza A by PCR NEGATIVE NEGATIVE Final   Influenza B by PCR NEGATIVE NEGATIVE Final    Comment: (NOTE) The Xpert Xpress SARS-CoV-2/FLU/RSV plus assay is intended as an aid in the diagnosis of influenza from Nasopharyngeal swab  specimens and should not be used as a sole basis for treatment. Nasal washings and aspirates are unacceptable for Xpert Xpress SARS-CoV-2/FLU/RSV testing.  Fact Sheet for Patients: https://www.fda.gov/media/152166/download  Fact Sheet for Healthcare Providers: https://www.fda.gov/media/152162/download  This test is not yet approved or cleared by the [place] FDA and has been authorized for detection and/or diagnosis of SARS-CoV-2 by FDA under an Emergency Use Authorization (EUA). This EUA will remain in effect (meaning this test can be used) for the duration of the COVID-19 declaration under Section 564(b)(1) of the Act, 21 U.S.C. section 360bbb-3(b)(1), unless the authorization is terminated or revoked.  Performed at [facility], [address] [place], [place] [address]      Labs: BNP (last 3 results) Recent Labs    05/22/22 1544 10/26/22 1110 11/14/22 1429  BNP 47.7 38.6 36.0   Basic Metabolic Panel: Recent Labs  Lab 11/14/22 1429 11/15/22 0322  NA 141 137  K 3.8 4.5  CL 108 104  CO2 23 24  GLUCOSE 117* 123*  BUN 13 19  CREATININE 1.20* 0.90  CALCIUM 8.7* 9.0   Liver Function Tests: Recent Labs  Lab 11/14/22 1429 11/15/22 0322  AST 28 23  ALT 29 25  ALKPHOS 63 71  BILITOT 0.6 0.4  PROT 6.4* 6.8  ALBUMIN 3.5 3.7   No results for input(s): "LIPASE", "AMYLASE" in the last 168 hours. No results for input(s): "AMMONIA" in the last 168 hours. CBC: Recent Labs  Lab 11/14/22 1429 11/15/22 0322  WBC 9.0 7.3  NEUTROABS 5.0  --   HGB 14.0 13.9  HCT 44.4 43.9  MCV 88.3 89.0  PLT 366 304   Cardiac Enzymes: No results for input(s): "CKTOTAL", "CKMB", "CKMBINDEX", "TROPONINI" in the last 168 hours. BNP: Invalid input(s): "POCBNP" CBG: No results for input(s): "GLUCAP" in the last 168 hours. D-Dimer No results for input(s): "DDIMER" in the last 72 hours. Hgb A1c No results for input(s): "HGBA1C" in the last 72  hours. Lipid Profile No results for input(s): "CHOL", "HDL", "LDLCALC", "TRIG", "CHOLHDL", "LDLDIRECT" in the last 72 hours. Thyroid function studies No results for input(s): "TSH", "T4TOTAL", "T3FREE", "THYROIDAB" in the last 72 hours.  Invalid input(s): "FREET3" Anemia work up No results for input(s): "VITAMINB12", "FOLATE", "FERRITIN", "TIBC", "IRON", "RETICCTPCT" in the last 72 hours. Urinalysis    Component Value Date/Time   COLORURINE YELLOW 08/17/2022 1849   APPEARANCEUR CLEAR 08/17/2022 1849   APPEARANCEUR Clear 10/21/2021 1004   LABSPEC 1.014 08/17/2022 1849   PHURINE 5.0 08/17/2022 1849   GLUCOSEU   NEGATIVE 08/17/2022 1849   GLUCOSEU NEG mg/dL 04/09/2008 2037   HGBUR NEGATIVE 08/17/2022 1849   BILIRUBINUR NEGATIVE 08/17/2022 1849   BILIRUBINUR Negative 10/21/2021 Media 08/17/2022 1849   PROTEINUR NEGATIVE 08/17/2022 1849   UROBILINOGEN 0.2 03/11/2015 1323   NITRITE NEGATIVE 08/17/2022 1849   LEUKOCYTESUR LARGE (A) 08/17/2022 1849   Sepsis Labs Recent Labs  Lab 11/14/22 1429 11/15/22 0322  WBC 9.0 7.3   Microbiology Recent Results (from the past 240 hour(s))  Resp Panel by RT-PCR (Flu A&B, Covid) Anterior Nasal Swab     Status: None   Collection Time: 11/14/22  5:36 PM   Specimen: Anterior Nasal Swab  Result Value Ref Range Status   SARS Coronavirus 2 by RT PCR NEGATIVE NEGATIVE Final    Comment: (NOTE) SARS-CoV-2 target nucleic acids are NOT DETECTED.  The SARS-CoV-2 RNA is generally detectable in upper respiratory specimens during the acute phase of infection. The lowest concentration of SARS-CoV-2 viral copies this assay can detect is 138 copies/mL. A negative result does not preclude SARS-Cov-2 infection and should not be used as the sole basis for treatment or other patient management decisions. A negative result may occur with  improper specimen collection/handling, submission of specimen other than nasopharyngeal swab, presence of  viral mutation(s) within the areas targeted by this assay, and inadequate number of viral copies(<138 copies/mL). A negative result must be combined with clinical observations, patient history, and epidemiological information. The expected result is Negative.  Fact Sheet for Patients:  EntrepreneurPulse.com.au  Fact Sheet for Healthcare Providers:  IncredibleEmployment.be  This test is no t yet approved or cleared by the [place] FDA and  has been authorized for detection and/or diagnosis of SARS-CoV-2 by FDA under an Emergency Use Authorization (EUA). This EUA will remain  in effect (meaning this test can be used) for the duration of the COVID-19 declaration under Section 564(b)(1) of the Act, 21 U.S.C.section 360bbb-3(b)(1), unless the authorization is terminated  or revoked sooner.       Influenza A by PCR NEGATIVE NEGATIVE Final   Influenza B by PCR NEGATIVE NEGATIVE Final    Comment: (NOTE) The Xpert Xpress SARS-CoV-2/FLU/RSV plus assay is intended as an aid in the diagnosis of influenza from Nasopharyngeal swab specimens and should not be used as a sole basis for treatment. Nasal washings and aspirates are unacceptable for Xpert Xpress SARS-CoV-2/FLU/RSV testing.  Fact Sheet for Patients: EntrepreneurPulse.com.au  Fact Sheet for Healthcare Providers: IncredibleEmployment.be  This test is not yet approved or cleared by the [place] FDA and has been authorized for detection and/or diagnosis of SARS-CoV-2 by FDA under an Emergency Use Authorization (EUA). This EUA will remain in effect (meaning this test can be used) for the duration of the COVID-19 declaration under Section 564(b)(1) of the Act, 21 U.S.C. section 360bbb-3(b)(1), unless the authorization is terminated or revoked.  Performed at [facility], [place] [address] [place], [place] [address]      Time  coordinating discharge: 23mns  SIGNED:   JKathie Dike MD  [facility] 11/17/2022, 9:10 PM   If 7PM-7AM, please contact night-coverage [email or web address]

## 2022-11-17 NOTE — Progress Notes (Signed)
Initial Nutrition Assessment  DOCUMENTATION CODES:   Non-severe (moderate) malnutrition in context of chronic illness  INTERVENTION:  - Regular diet.  - Encourage intake as tolerated.  - Monitor weights.    NUTRITION DIAGNOSIS:   Moderate Malnutrition related to chronic illness (COPD) as evidenced by mild fat depletion, mild muscle depletion.  GOAL:   Patient will meet greater than or equal to 90% of their needs  MONITOR:   PO intake, Weight trends  REASON FOR ASSESSMENT:   Malnutrition Screening Tool    ASSESSMENT:   61 y.o. female with medical history significant of bipolar 1, hx marijuana abuse, COPD with frequent readmissions who presented with acute SOB and wheezing.   Patient reports a UBW of 165# and weight loss over the past few months due to "neck stones" that cause her to have trouble swallowing and regurgitating/vomiting of food. Patient notes she is still able to eat most foods and just tries to be mindful of it. Per EMR, patient weighed at 162# in December 2022 and dropped to 132# by September - an 18.5% weight loss in 9 months, significant. However, weight since that time has trended back up and patient weighed this admission at 149#. Patient reports these trends are likely accurate, and that she has been trying to eat more recently to work on weight gain. Admits she has not been able to cook as much recently due to being SOB from COPD.  However, she notes her current appetite is good and she has been enjoying the food. Documented to have consumed 100% of meals yesterday. Patient is hopeful to be able to discharge today. Informed MD of patient's report of swallowing difficult and vomiting, MD aware and note patient has follow up with her other doctor.  Medications reviewed and include: Protonix, Prednisone, Miralax prn  Labs reviewed:  -   NUTRITION - FOCUSED PHYSICAL EXAM:  Flowsheet Row Most Recent Value  Orbital Region Mild depletion  Upper Arm Region  Mild depletion  Thoracic and Lumbar Region No depletion  Buccal Region No depletion  Temple Region Mild depletion  Clavicle Bone Region Mild depletion  Clavicle and Acromion Bone Region Mild depletion  Scapular Bone Region Unable to assess  Dorsal Hand Mild depletion  Patellar Region Moderate depletion  Anterior Thigh Region Moderate depletion  Posterior Calf Region Moderate depletion  Edema (RD Assessment) None  Hair Reviewed  Eyes Reviewed  Mouth Reviewed  Skin Reviewed  Nails Reviewed       Diet Order:   Diet Order             Diet regular Room service appropriate? Yes; Fluid consistency: Thin  Diet effective now                   EDUCATION NEEDS:  Education needs have been addressed  Skin:  Skin Assessment: Reviewed RN Assessment  Last BM:  12/3  Height:  Ht Readings from Last 1 Encounters:  11/14/22 '5\' 3"'$  (1.6 m)   Weight:  Wt Readings from Last 1 Encounters:  11/14/22 67.8 kg    BMI:  Body mass index is 26.48 kg/m.  Estimated Nutritional Needs:  Kcal:  7262-0355 kcals Protein:  70-80 grams Fluid:  >/= 1.7L    Samson Frederic RD, LDN For contact information, refer to [facility].

## 2022-11-17 NOTE — Progress Notes (Signed)
Pt discharged to home. Prior to discharge, IV and tele were removed. Pt was given DC instructions regarding medications, conditions, and appointments. Pt verbalized understanding and stated no other concerns at this time. Pt stable at time of DC, and left in personal vehicle driven by significant other.

## 2022-11-18 ENCOUNTER — Telehealth: Payer: Self-pay

## 2022-11-18 NOTE — Telephone Encounter (Signed)
Transition Care Management Follow-up Telephone Call Date of discharge and from where: [facility] 11/17/2022 How have you been since you were released from the hospital? Weak, SHOB Any questions or concerns? No  Items Reviewed: Did the pt receive and understand the discharge instructions provided? Yes  Medications obtained and verified? Yes  Other? No  Any new allergies since your discharge? No  Dietary orders reviewed? Yes Do you have support at home? No   Home Care and Equipment/Supplies: Were home health services ordered? no If so, what is the name of the agency? N/a  Has the agency set up a time to come to the patient's home? not applicable Were any new equipment or medical supplies ordered?  No What is the name of the medical supply agency? N/a Were you able to get the supplies/equipment? not applicable Do you have any questions related to the use of the equipment or supplies? No  Functional Questionnaire: (I = Independent and D = Dependent) ADLs: I  Bathing/Dressing- I  Meal Prep- D  Eating- I  Maintaining continence- I  Transferring/Ambulation- I  Managing Meds- I  Follow up appointments reviewed:  PCP Hospital f/u appt confirmed? No  No avail. Appt. Slots. Will send message to staff to schedule Specialist Hospital f/u appt confirmed? No   Are transportation arrangements needed? No  If their condition worsens, is the pt aware to call PCP or go to the Emergency Dept.? Yes Was the patient provided with contact information for the PCP's office or ED? Yes Was to pt encouraged to call back with questions or concerns? Yes Juanda Crumble, LPN [place] Direct Dial [phone]

## 2022-11-18 NOTE — Telephone Encounter (Signed)
Transition Care Management Unsuccessful Follow-up Telephone Call  Date of discharge and from where:  [facility] 11/17/2022  Attempts:  1st Attempt  Reason for unsuccessful TCM follow-up call:  Left voice message Juanda Crumble, [place] Direct Dial [phone]

## 2022-11-29 ENCOUNTER — Inpatient Hospital Stay: Payer: Commercial Managed Care - HMO | Admitting: Primary Care

## 2022-12-14 ENCOUNTER — Encounter: Payer: Commercial Managed Care - HMO | Admitting: Student

## 2022-12-21 ENCOUNTER — Other Ambulatory Visit (HOSPITAL_COMMUNITY): Payer: Self-pay

## 2022-12-31 ENCOUNTER — Encounter: Payer: Commercial Managed Care - HMO | Admitting: Student

## 2022-12-31 NOTE — Progress Notes (Deleted)
COPD exacerbation -Pt with hx of recurrent copd exacerbations,  -O2 requirements improving, Initially required bipap, now weaned down to room air -continue scheduled nebs -IV steroids weaned to prednisone taper -See below. Pt does have acute hoarse voice that began 12/2, concerns for concurrent poorly treated GERD/reflux.  She is improving with PPI twice daily -Overall respiratory status appears to be approaching baseline.  Resume Trelegy on discharge, continue prednisone taper, continue albuterol as needed.  Follow-up with primary pulmonologist.    Pulmonology:   06/09/2022 Dr. Silas Flood   uncontrolled asthma given her significant atopic and allergic symptoms.  PFTs consistent with asthma.  Overall symptoms stable, may be slightly better with inhaled therapies as below.  Intermittent worsening symptoms here and there.  Difficult to control on inhalers alone.  Needs Biologics as below.   COPD/asthma overlap:  FEV1/FVC ratio borderline at 70% in 2009.  Similar recently 07/2021 with significant bronchodilator response. Previously on triple inhaled therapy.   ProAir as needed.   Eosinophils chronically elevated most recently 600.  IgE 2600.  Given elevation of IgE out of portion eosinophils as well as significant histaminergic symptoms such as hives etc., recommend starting Xolair.   EpiPen access issues have been resolved per her report, she has an EpiPen.  Schedule appointment with pharmacy 06/21/2022 to start Xolair injections.  Chronic smoker -Reports quitting tobacco on 11/27. Pt congratulated -Continue nicotine patch   Hx marijuana abuse -cessation done face to face -Pt reports having quit marijuana for quite a while   GERD -Pt reports compliance to PPI PTA -On futher questioning, reports "burning" chest pain with acidic taste in back of mouth assoicated with sob worse at night, awakening pt  -Reports typically laying down shortly after meal or taking meds -Remains with hoarse voice  that began 12/2 -continue on BID PPI.  -Pt has reported some improvement already -Reviewed significant lifestyle changes to help manage GERD   Anxiety -seems stable at this time -Continue Atarax as needed

## 2023-01-26 ENCOUNTER — Emergency Department (HOSPITAL_COMMUNITY): Payer: Medicaid Other

## 2023-01-26 ENCOUNTER — Other Ambulatory Visit: Payer: Self-pay

## 2023-01-26 ENCOUNTER — Encounter (HOSPITAL_COMMUNITY): Payer: Self-pay

## 2023-01-26 ENCOUNTER — Emergency Department (HOSPITAL_COMMUNITY)
Admission: EM | Admit: 2023-01-26 | Discharge: 2023-01-26 | Disposition: A | Discharge status: Home / Self Care | Payer: Medicaid Other | Attending: Emergency Medicine | Admitting: Emergency Medicine

## 2023-01-26 DIAGNOSIS — J441 Chronic obstructive pulmonary disease with (acute) exacerbation: Secondary | ICD-10-CM | POA: Diagnosis not present

## 2023-01-26 DIAGNOSIS — J9801 Acute bronchospasm: Secondary | ICD-10-CM | POA: Diagnosis not present

## 2023-01-26 DIAGNOSIS — R000 Tachycardia, unspecified: Secondary | ICD-10-CM | POA: Diagnosis not present

## 2023-01-26 DIAGNOSIS — Z1152 Encounter for screening for COVID-19: Secondary | ICD-10-CM | POA: Diagnosis not present

## 2023-01-26 DIAGNOSIS — R0602 Shortness of breath: Secondary | ICD-10-CM | POA: Diagnosis present

## 2023-01-26 DIAGNOSIS — Z7982 Long term (current) use of aspirin: Secondary | ICD-10-CM | POA: Insufficient documentation

## 2023-01-26 LAB — COMPREHENSIVE METABOLIC PANEL
ALT: 23 U/L (ref 0–44)
AST: 32 U/L (ref 15–41)
Albumin: 3.8 g/dL (ref 3.5–5.0)
Alkaline Phosphatase: 69 U/L (ref 38–126)
Anion gap: 10 (ref 5–15)
BUN: 10 mg/dL (ref 8–23)
CO2: 25 mmol/L (ref 22–32)
Calcium: 8.8 mg/dL — ABNORMAL LOW (ref 8.9–10.3)
Chloride: 106 mmol/L (ref 98–111)
Creatinine, Ser: 1.02 mg/dL — ABNORMAL HIGH (ref 0.44–1.00)
GFR, Estimated: >60 mL/min (ref >60)
Glucose, Bld: 112 mg/dL — ABNORMAL HIGH (ref 70–99)
Potassium: 3.7 mmol/L (ref 3.5–5.1)
Sodium: 141 mmol/L (ref 135–145)
Total Bilirubin: 0.5 mg/dL (ref 0.3–1.2)
Total Protein: 6.8 g/dL (ref 6.5–8.1)

## 2023-01-26 LAB — TROPONIN I (HIGH SENSITIVITY): Troponin I (High Sensitivity): 4 ng/L (ref <18)

## 2023-01-26 LAB — CBC WITH DIFFERENTIAL/PLATELET
Abs Immature Granulocytes: 0.02 10*3/uL (ref 0.00–0.07)
Basophils Absolute: 0.1 10*3/uL (ref 0.0–0.1)
Basophils Relative: 1 %
Eosinophils Absolute: 0.7 10*3/uL — ABNORMAL HIGH (ref 0.0–0.5)
Eosinophils Relative: 7 %
HCT: 45.1 % (ref 36.0–46.0)
Hemoglobin: 14.4 g/dL (ref 12.0–15.0)
Immature Granulocytes: 0 %
Lymphocytes Relative: 41 %
Lymphs Abs: 3.9 10*3/uL (ref 0.7–4.0)
MCH: 28.4 pg (ref 26.0–34.0)
MCHC: 31.9 g/dL (ref 30.0–36.0)
MCV: 89 fL (ref 80.0–100.0)
Monocytes Absolute: 0.9 10*3/uL (ref 0.1–1.0)
Monocytes Relative: 10 %
Neutro Abs: 4 10*3/uL (ref 1.7–7.7)
Neutrophils Relative %: 41 %
Platelets: 321 10*3/uL (ref 150–400)
RBC: 5.07 MIL/uL (ref 3.87–5.11)
RDW: 13.6 % (ref 11.5–15.5)
WBC: 9.6 10*3/uL (ref 4.0–10.5)
nRBC: 0 % (ref 0.0–0.2)

## 2023-01-26 LAB — LACTIC ACID, PLASMA: Lactic Acid, Venous: 1.4 mmol/L (ref 0.5–1.9)

## 2023-01-26 LAB — RESP PANEL BY RT-PCR (RSV, FLU A&B, COVID)  RVPGX2
Influenza A by PCR: NEGATIVE
Influenza B by PCR: NEGATIVE
Resp Syncytial Virus by PCR: NEGATIVE
SARS Coronavirus 2 by RT PCR: NEGATIVE

## 2023-01-26 LAB — BRAIN NATRIURETIC PEPTIDE: B Natriuretic Peptide: 54.6 pg/mL (ref 0.0–100.0)

## 2023-01-26 MED ORDER — IPRATROPIUM-ALBUTEROL 0.5-2.5 (3) MG/3ML IN SOLN: 3.0000 mL | RESPIRATORY_TRACT | 1 refills | Status: DC | PRN

## 2023-01-26 MED ORDER — PREDNISONE 20 MG PO TABS
60.0000 mg | ORAL_TABLET | Freq: Once | ORAL | Status: AC
  Administered 2023-01-26: 60 mg via ORAL
  Filled 2023-01-26: qty 3

## 2023-01-26 MED ORDER — PREDNISONE 20 MG PO TABS: ORAL_TABLET | ORAL | 0 refills | Status: DC

## 2023-01-26 MED ORDER — ALBUTEROL SULFATE (2.5 MG/3ML) 0.083% IN NEBU
10.0000 mg/h | INHALATION_SOLUTION | Freq: Once | RESPIRATORY_TRACT | Status: AC
  Administered 2023-01-26: 10 mg/h via RESPIRATORY_TRACT
  Filled 2023-01-26: qty 3

## 2023-01-26 MED ORDER — LORAZEPAM 2 MG/ML IJ SOLN
1.0000 mg | Freq: Once | INTRAMUSCULAR | Status: AC
  Administered 2023-01-26: 1 mg via INTRAVENOUS
  Filled 2023-01-26: qty 1

## 2023-01-26 NOTE — Discharge Instructions (Addendum)
1.  You had a severe episode of bronchospasm with your COPD and exposure to chemical irritant. 2.  At this time you have improved significantly.  However, you should continue 4 additional days of prednisone as prescribed.  Use the albuterol Atrovent solution by nebulizer treatment every 2-4 hours for the next 12 hours.  You may then use as needed. 3.  Avoid all possible chemical irritants.  Absolutely no smoking or secondhand smoke exposure. 4.  Return to the emergency department immediately if you feel that your symptoms are worsening again.  Try to follow-up with your doctor for recheck within the next 2 to 4 days.

## 2023-01-26 NOTE — ED Provider Notes (Signed)
[facility] AT [facility] Provider Note   CSN: [id number] Arrival date & time: 01/26/23  1605     History  Chief Complaint  Patient presents with   Shortness of Breath    Kathleen [name] is a 62 y.o. female.  HPI Patient has history of COPD.  She was cleaning her garage and ended up inhaling some lighter fluid accidentally.  She had dropped the bottle and then when she picked it back up it squeezed fumes out.  She got quite short of breath.  EMS called and patient was given a nebulizer treatment.  She temporarily felt better and did not think she would need to come to the hospital.  However she then had a rebound and got worse.  At that point the patient developed significant wheezing and agitation.  She was given 2 DuoNebs, 125 mg of Solu-Medrol, 0.3 mg epi and 2 g of magnesium.  On arrival, patient reports she still feels extremely short of breath.  She denies chest pain.  She denies any recent fevers or productive cough.  She reports she had been feeling pretty well before this occurred while she was cleaning the garage.  Patient reports she has had severe attacks of difficulty breathing and had to be intubated previously.    Home Medications Prior to Admission medications   Medication Sig Start Date End Date Taking? Authorizing Provider  ipratropium-albuterol (DUONEB) 0.5-2.5 (3) MG/3ML SOLN Take 3 mLs by nebulization every 2 (two) hours as needed. 01/26/23  Yes Charlesetta Shanks, MD  predniSONE (DELTASONE) 20 MG tablet 2 tabs po daily x 4 days 01/27/23  Yes Decker Cogdell, Jeannie Done, MD  albuterol (PROVENTIL) (2.5 MG/3ML) 0.083% nebulizer solution Take 3 mLs (2.5 mg total) by nebulization every 4 (four) hours as needed for wheezing or shortness of breath. 11/17/22   Kathie Dike, MD  albuterol (VENTOLIN HFA) 108 (90 Base) MCG/ACT inhaler Inhale 2 puffs into the lungs every 6 (six) hours as needed for wheezing or shortness of breath. Patient taking differently:  Inhale 2 puffs into the lungs every 2 (two) hours as needed for wheezing or shortness of breath. 10/06/22   Riesa Pope, MD  aspirin EC 81 MG tablet Take 81 mg by mouth daily. Swallow whole.    [provider]  EPINEPHrine (EPIPEN 2-PAK) 0.3 mg/0.3 mL IJ SOAJ injection Inject 0.3 mg into the muscle as needed for anaphylaxis. 07/30/22   Lacinda Axon, MD  Fluticasone-Umeclidin-Vilant (TRELEGY ELLIPTA) 200-62.5-25 MCG/ACT AEPB Inhale 2 puffs into the lungs daily. 11/17/22   Kathie Dike, MD  guaiFENesin-dextromethorphan (ROBITUSSIN DM) 100-10 MG/5ML syrup Take 10 mLs by mouth every 4 (four) hours as needed for cough. 10/28/22   Terrilee Croak, MD  hydrOXYzine (ATARAX) 25 MG tablet Take 1 tablet (25 mg total) by mouth 3 (three) times daily as needed for anxiety. 11/17/22   Kathie Dike, MD  lidocaine (LIDODERM) 5 % Place 1 patch onto the skin daily. Remove & Discard patch within 12 hours or as directed by MD Patient taking differently: Place 2 patches onto the skin See admin instructions. Apply 2 patches to the lower back once a day- Remove & Discard patch within 12 hours or as directed by MD 07/30/22   Lacinda Axon, MD  loratadine (CLARITIN) 10 MG tablet Take 1 tablet (10 mg total) by mouth daily. Patient taking differently: Take 10 mg by mouth at bedtime. 03/09/22   Hunsucker, [name], MD  methocarbamol (ROBAXIN) 500 MG tablet Take 1  tablet (500 mg total) by mouth every 6 (six) hours as needed for muscle spasms. 11/17/22   Kathie Dike, MD  montelukast (SINGULAIR) 10 MG tablet Take 1 tablet (10 mg total) by mouth at bedtime. 07/30/22   Lacinda Axon, MD  MUCINEX FAST-MAX [phone] MG/20ML LIQD Take 15 mLs by mouth every 4 (four) hours as needed (to loosen phlegm).    [provider]  Multiple Vitamin (MULTIVITAMIN WITH MINERALS) TABS tablet Take 1 tablet by mouth daily. 08/20/22   Hosie Poisson, MD  Nicotine 21-14-7 MG/24HR KIT Take as prescribed 11/17/22    Kathie Dike, MD  ondansetron (ZOFRAN) 4 MG tablet TAKE 1 TABLET BY MOUTH ONCE DAILY AS NEEDED FOR NAUSEA FOR VOMITING Patient taking differently: Take 4 mg by mouth daily as needed for nausea or vomiting. 02/18/21   [name], MD  oxyCODONE (OXY IR/ROXICODONE) 5 MG immediate release tablet Take 1 tablet (5 mg total) by mouth every 4 (four) hours as needed for severe pain. 11/17/22   Kathie Dike, MD  pantoprazole (PROTONIX) 40 MG tablet Take 1 tablet (40 mg total) by mouth 2 (two) times daily before a meal. 11/17/22   Kathie Dike, MD  predniSONE (DELTASONE) 10 MG tablet Take 29m po daily for 2 days then 35mdaily for 2 days then 2075maily for 2 days then 55m59mily for 2 days then stop 11/17/22   MemoKathie Dike      Allergies    Bee venom, Peach [prunus persica], Hydroxyzine, Topamax [topiramate], and Tramadol    Review of Systems   Review of Systems  Physical Exam Updated Vital Signs BP (!) 158/103   Pulse (!) 114   Resp (!) 32   Ht 5' 3"$  (1.6 m)   Wt 67.8 kg   LMP 11/08/2014   SpO2 97%   BMI 26.48 kg/m  Physical Exam Constitutional:      Comments: Patient is alert.  She is very anxious and tachypneic.  She is speaking in short full sentences.  HENT:     Mouth/Throat:     Pharynx: Oropharynx is clear.  Eyes:     Extraocular Movements: Extraocular movements intact.  Cardiovascular:     Rate and Rhythm: Regular rhythm. Tachycardia present.  Pulmonary:     Comments: Significant increased work of breathing.  Tachypneic.  Patient is alert.  She is speaking in full short sentences.  Extensive wheezing throughout both lung fields but airflow to the lower lung fields. Abdominal:     General: There is no distension.     Palpations: Abdomen is soft.     Tenderness: There is no abdominal tenderness. There is no guarding.  Musculoskeletal:        General: No swelling. Normal range of motion.     Right lower leg: No edema.     Left lower leg: No edema.  Skin:     General: Skin is warm and dry.  Neurological:     General: No focal deficit present.     Mental Status: She is oriented to person, place, and time.     Motor: No weakness.     Coordination: Coordination normal.  Psychiatric:     Comments: Very anxious.     ED Results / Procedures / Treatments   Labs (all labs ordered are listed, but only abnormal results are displayed) Labs Reviewed  COMPREHENSIVE METABOLIC PANEL - Abnormal; Notable for the following components:      Result Value   Glucose, Bld 112 (*)  Creatinine, Ser 1.02 (*)    Calcium 8.8 (*)    All other components within normal limits  CBC WITH DIFFERENTIAL/PLATELET - Abnormal; Notable for the following components:   Eosinophils Absolute 0.7 (*)    All other components within normal limits  RESP PANEL BY RT-PCR (RSV, FLU A&B, COVID)  RVPGX2  BRAIN NATRIURETIC PEPTIDE  LACTIC ACID, PLASMA  LACTIC ACID, PLASMA  TROPONIN I (HIGH SENSITIVITY)    EKG EKG Interpretation  Date/Time:  Wednesday January 26 2023 16:27:58 EST Ventricular Rate:  117 PR Interval:  142 QRS Duration: 75 QT Interval:  364 QTC Calculation: 508 R Axis:   -69 Text Interpretation: Sinus tachycardia LAD, consider left anterior fascicular block no sig change from previous Confirmed by Charlesetta Shanks [phone]) on 01/26/2023 4:54:53 PM  Radiology DG Chest Port 1 View  Result Date: 01/26/2023 CLINICAL DATA:  Shortness of breath, asthma. EXAM: PORTABLE CHEST 1 VIEW COMPARISON:  Chest x-ray 11/14/2022 FINDINGS: The heart size and mediastinal contours are within normal limits. Both lungs are clear. The visualized skeletal structures are unremarkable. IMPRESSION: No active disease. Electronically Signed   By: Ronney Asters M.D.   On: 01/26/2023 17:29    Procedures Procedures   CRITICAL CARE Performed by: Charlesetta Shanks   Total critical care time: 30 minutes  Critical care time was exclusive of separately billable procedures and treating other  patients.  Critical care was necessary to treat or prevent imminent or life-threatening deterioration.  Critical care was time spent personally by me on the following activities: development of treatment plan with patient and/or surrogate as well as nursing, discussions with consultants, evaluation of patient's response to treatment, examination of patient, obtaining history from patient or surrogate, ordering and performing treatments and interventions, ordering and review of laboratory studies, ordering and review of radiographic studies, pulse oximetry and re-evaluation of patient's condition.  Medications Ordered in ED Medications  predniSONE (DELTASONE) tablet 60 mg (has no administration in time range)  LORazepam (ATIVAN) injection 1 mg (1 mg Intravenous Given 01/26/23 1621)  albuterol (PROVENTIL) (2.5 MG/3ML) 0.083% nebulizer solution (10 mg/hr Nebulization Given 01/26/23 1636)    ED Course/ Medical Decision Making/ A&P                             Medical Decision Making Amount and/or Complexity of Data Reviewed Labs: ordered. Radiology: ordered.  Risk Prescription drug management.   Patient presented by EMS with significant increased work of breathing and wheezing in the lung fields.  She does have history of COPD with severe exacerbations.  EMR reviewed.  Patient has previously required BiPAP.  Patient was treated with 2 DuoNebs, 2 g of magnesium, 0.3 mg epinephrine and 125 mg Solu-Medrol prior to arrival.  On arrival she has ongoing neb treatment.  Patient's mental status is clear.  She is tachypneic with increased work of breathing but maintaining airway and mentation.  Patient reports she is very anxious and needs something to help her calm her nerves.  Patient will be given 0.5 mg of Ativan observed.  She tolerated 0.5 mg without somnolence.  Additional 0.5 mg given.  Patient is tolerated this well.  Recheck every 10 minutes, patient has shown significant improvement.  She is  now more relaxed and heart rate has come down to the low 100s.  Respiratory is at bedside and we are continuing nebulizer therapy.   Portable checks x-ray visually reviewed by myself and also reviewed radiology interpretation.  No acute infiltrates no pneumothorax no mediastinal widening.  Labs have returned within normal limits.  No elevation in troponin, COVID and influenza testing negative, no leukocytosis.  Patient continued to improve.  He has become more more comfortable and now transition off of supplemental oxygen. Reassessment at 1900, patient feels well and back to baseline.  She is not having any respiratory distress.  Auscultation does show some occasional wheeze but good airflow.  This time patient reports feeling ready to go home.  Plan will be for an oral dose of prednisone 60 mg.  She reports she does have a nebulizer machine would like refills for DuoNeb solution.  I have advised the patient that she must avoid all exposure to any chemical irritants including any smoke.  At this time appears she had a significant exacerbation of bronchospasm due to chemical irritant.         Final Clinical Impression(s) / ED Diagnoses Final diagnoses:  COPD exacerbation ([place])  Bronchospasm, acute    Rx / DC Orders ED Discharge Orders          Ordered    predniSONE (DELTASONE) 20 MG tablet        01/26/23 1832    ipratropium-albuterol (DUONEB) 0.5-2.5 (3) MG/3ML SOLN  Every 2 hours PRN        01/26/23 1832              Charlesetta Shanks, MD 01/26/23 [phone]

## 2023-01-26 NOTE — ED Triage Notes (Signed)
Patient brought in by EMS due to shortness of breath. Patient was cleaning out garage and inhaled some chemicals. Patient received 5 mg albuterol by fire, received 2 duo nebs in route, 127m solu medrol, 0.367mepi and 2gm mag. Patient has history of asthma and COPD.

## 2023-01-26 NOTE — ED Notes (Signed)
Pt states that she does not want er vitals rechecked when offered.

## 2023-01-30 ENCOUNTER — Inpatient Hospital Stay (HOSPITAL_COMMUNITY)
Admission: EM | Admit: 2023-01-30 | Discharge: 2023-02-01 | DRG: 191 | Disposition: A | Discharge status: Home / Self Care | Payer: Medicaid Other | Attending: Family Medicine | Admitting: Family Medicine

## 2023-01-30 ENCOUNTER — Other Ambulatory Visit: Payer: Self-pay

## 2023-01-30 ENCOUNTER — Inpatient Hospital Stay (HOSPITAL_COMMUNITY): Payer: Medicaid Other

## 2023-01-30 ENCOUNTER — Emergency Department (HOSPITAL_COMMUNITY): Payer: Medicaid Other

## 2023-01-30 DIAGNOSIS — Z79899 Other long term (current) drug therapy: Secondary | ICD-10-CM

## 2023-01-30 DIAGNOSIS — F411 Generalized anxiety disorder: Secondary | ICD-10-CM | POA: Diagnosis present

## 2023-01-30 DIAGNOSIS — G43909 Migraine, unspecified, not intractable, without status migrainosus: Secondary | ICD-10-CM | POA: Diagnosis present

## 2023-01-30 DIAGNOSIS — K219 Gastro-esophageal reflux disease without esophagitis: Secondary | ICD-10-CM | POA: Diagnosis present

## 2023-01-30 DIAGNOSIS — Z720 Tobacco use: Secondary | ICD-10-CM | POA: Diagnosis present

## 2023-01-30 DIAGNOSIS — Z87892 Personal history of anaphylaxis: Secondary | ICD-10-CM | POA: Diagnosis not present

## 2023-01-30 DIAGNOSIS — Z7982 Long term (current) use of aspirin: Secondary | ICD-10-CM

## 2023-01-30 DIAGNOSIS — Z90721 Acquired absence of ovaries, unilateral: Secondary | ICD-10-CM

## 2023-01-30 DIAGNOSIS — M19041 Primary osteoarthritis, right hand: Secondary | ICD-10-CM | POA: Diagnosis present

## 2023-01-30 DIAGNOSIS — F319 Bipolar disorder, unspecified: Secondary | ICD-10-CM | POA: Diagnosis present

## 2023-01-30 DIAGNOSIS — Z885 Allergy status to narcotic agent status: Secondary | ICD-10-CM

## 2023-01-30 DIAGNOSIS — J45901 Unspecified asthma with (acute) exacerbation: Secondary | ICD-10-CM

## 2023-01-30 DIAGNOSIS — R55 Syncope and collapse: Secondary | ICD-10-CM

## 2023-01-30 DIAGNOSIS — Z91410 Personal history of adult physical and sexual abuse: Secondary | ICD-10-CM

## 2023-01-30 DIAGNOSIS — K224 Dyskinesia of esophagus: Secondary | ICD-10-CM | POA: Diagnosis present

## 2023-01-30 DIAGNOSIS — G4733 Obstructive sleep apnea (adult) (pediatric): Secondary | ICD-10-CM | POA: Diagnosis present

## 2023-01-30 DIAGNOSIS — J439 Emphysema, unspecified: Secondary | ICD-10-CM | POA: Diagnosis present

## 2023-01-30 DIAGNOSIS — Z6826 Body mass index (BMI) 26.0-26.9, adult: Secondary | ICD-10-CM | POA: Diagnosis not present

## 2023-01-30 DIAGNOSIS — Z1152 Encounter for screening for COVID-19: Secondary | ICD-10-CM

## 2023-01-30 DIAGNOSIS — F17210 Nicotine dependence, cigarettes, uncomplicated: Secondary | ICD-10-CM | POA: Diagnosis present

## 2023-01-30 DIAGNOSIS — J441 Chronic obstructive pulmonary disease with (acute) exacerbation: Principal | ICD-10-CM | POA: Diagnosis present

## 2023-01-30 DIAGNOSIS — F1210 Cannabis abuse, uncomplicated: Secondary | ICD-10-CM | POA: Diagnosis present

## 2023-01-30 DIAGNOSIS — G8929 Other chronic pain: Secondary | ICD-10-CM | POA: Diagnosis present

## 2023-01-30 DIAGNOSIS — E440 Moderate protein-calorie malnutrition: Secondary | ICD-10-CM | POA: Diagnosis present

## 2023-01-30 DIAGNOSIS — Z91018 Allergy to other foods: Secondary | ICD-10-CM

## 2023-01-30 DIAGNOSIS — Z91199 Patient's noncompliance with other medical treatment and regimen due to unspecified reason: Secondary | ICD-10-CM

## 2023-01-30 DIAGNOSIS — Z888 Allergy status to other drugs, medicaments and biological substances status: Secondary | ICD-10-CM

## 2023-01-30 DIAGNOSIS — E785 Hyperlipidemia, unspecified: Secondary | ICD-10-CM | POA: Diagnosis present

## 2023-01-30 DIAGNOSIS — M545 Low back pain, unspecified: Secondary | ICD-10-CM | POA: Diagnosis present

## 2023-01-30 DIAGNOSIS — M533 Sacrococcygeal disorders, not elsewhere classified: Secondary | ICD-10-CM

## 2023-01-30 DIAGNOSIS — M19042 Primary osteoarthritis, left hand: Secondary | ICD-10-CM | POA: Diagnosis present

## 2023-01-30 DIAGNOSIS — E876 Hypokalemia: Secondary | ICD-10-CM | POA: Diagnosis present

## 2023-01-30 DIAGNOSIS — F419 Anxiety disorder, unspecified: Secondary | ICD-10-CM | POA: Diagnosis present

## 2023-01-30 DIAGNOSIS — J4489 Other specified chronic obstructive pulmonary disease: Secondary | ICD-10-CM

## 2023-01-30 DIAGNOSIS — Z91030 Bee allergy status: Secondary | ICD-10-CM

## 2023-01-30 LAB — CBC
HCT: 45.6 % (ref 36.0–46.0)
Hemoglobin: 14.4 g/dL (ref 12.0–15.0)
MCH: 27.8 pg (ref 26.0–34.0)
MCHC: 31.6 g/dL (ref 30.0–36.0)
MCV: 88 fL (ref 80.0–100.0)
Platelets: 278 10*3/uL (ref 150–400)
RBC: 5.18 MIL/uL — ABNORMAL HIGH (ref 3.87–5.11)
RDW: 13.5 % (ref 11.5–15.5)
WBC: 2.8 10*3/uL — ABNORMAL LOW (ref 4.0–10.5)
nRBC: 0 % (ref 0.0–0.2)

## 2023-01-30 LAB — BASIC METABOLIC PANEL
Anion gap: 10 (ref 5–15)
BUN: 12 mg/dL (ref 8–23)
CO2: 25 mmol/L (ref 22–32)
Calcium: 9 mg/dL (ref 8.9–10.3)
Chloride: 106 mmol/L (ref 98–111)
Creatinine, Ser: 0.96 mg/dL (ref 0.44–1.00)
GFR, Estimated: >60 mL/min (ref >60)
Glucose, Bld: 93 mg/dL (ref 70–99)
Potassium: 2.9 mmol/L — ABNORMAL LOW (ref 3.5–5.1)
Sodium: 141 mmol/L (ref 135–145)

## 2023-01-30 LAB — CBC WITH DIFFERENTIAL/PLATELET
Abs Immature Granulocytes: 0.02 10*3/uL (ref 0.00–0.07)
Basophils Absolute: 0.1 10*3/uL (ref 0.0–0.1)
Basophils Relative: 1 %
Eosinophils Absolute: 0.5 10*3/uL (ref 0.0–0.5)
Eosinophils Relative: 8 %
HCT: 47.9 % — ABNORMAL HIGH (ref 36.0–46.0)
Hemoglobin: 15.4 g/dL — ABNORMAL HIGH (ref 12.0–15.0)
Immature Granulocytes: 0 %
Lymphocytes Relative: 38 %
Lymphs Abs: 2.5 10*3/uL (ref 0.7–4.0)
MCH: 27.8 pg (ref 26.0–34.0)
MCHC: 32.2 g/dL (ref 30.0–36.0)
MCV: 86.5 fL (ref 80.0–100.0)
Monocytes Absolute: 0.6 10*3/uL (ref 0.1–1.0)
Monocytes Relative: 9 %
Neutro Abs: 2.9 10*3/uL (ref 1.7–7.7)
Neutrophils Relative %: 44 %
Platelets: 305 10*3/uL (ref 150–400)
RBC: 5.54 MIL/uL — ABNORMAL HIGH (ref 3.87–5.11)
RDW: 13.4 % (ref 11.5–15.5)
WBC: 6.6 10*3/uL (ref 4.0–10.5)
nRBC: 0 % (ref 0.0–0.2)

## 2023-01-30 LAB — CREATININE, SERUM
Creatinine, Ser: 0.89 mg/dL (ref 0.44–1.00)
GFR, Estimated: >60 mL/min (ref >60)

## 2023-01-30 LAB — BLOOD GAS, VENOUS
Acid-Base Excess: 3.7 mmol/L — ABNORMAL HIGH (ref 0.0–2.0)
Bicarbonate: 29.2 mmol/L — ABNORMAL HIGH (ref 20.0–28.0)
O2 Saturation: 52.8 %
Patient temperature: 37
pCO2, Ven: 46 mmHg (ref 44–60)
pH, Ven: 7.41 (ref 7.25–7.43)
pO2, Ven: 31 mmHg — CL (ref 32–45)

## 2023-01-30 LAB — TROPONIN I (HIGH SENSITIVITY)
Troponin I (High Sensitivity): 4 ng/L (ref <18)
Troponin I (High Sensitivity): 3 ng/L (ref <18)

## 2023-01-30 LAB — RESP PANEL BY RT-PCR (RSV, FLU A&B, COVID)  RVPGX2
Influenza A by PCR: NEGATIVE
Influenza B by PCR: NEGATIVE
Resp Syncytial Virus by PCR: NEGATIVE
SARS Coronavirus 2 by RT PCR: NEGATIVE

## 2023-01-30 LAB — BRAIN NATRIURETIC PEPTIDE: B Natriuretic Peptide: 24.5 pg/mL (ref 0.0–100.0)

## 2023-01-30 LAB — HIV ANTIBODY (ROUTINE TESTING W REFLEX): HIV Screen 4th Generation wRfx: NONREACTIVE

## 2023-01-30 MED ORDER — MORPHINE SULFATE (PF) 2 MG/ML IV SOLN
1.0000 mg | INTRAVENOUS | Status: DC | PRN
  Administered 2023-01-30 – 2023-01-31 (×7): 1 mg via INTRAVENOUS
  Filled 2023-01-30 (×7): qty 1

## 2023-01-30 MED ORDER — MAGNESIUM SULFATE 2 GM/50ML IV SOLN
2.0000 g | Freq: Once | INTRAVENOUS | Status: AC
  Administered 2023-01-30: 2 g via INTRAVENOUS
  Filled 2023-01-30: qty 50

## 2023-01-30 MED ORDER — POTASSIUM CHLORIDE 10 MEQ/100ML IV SOLN: 10.0000 meq | INTRAVENOUS | Status: DC

## 2023-01-30 MED ORDER — ALBUTEROL SULFATE (2.5 MG/3ML) 0.083% IN NEBU: 3.0000 mL | INHALATION_SOLUTION | RESPIRATORY_TRACT | Status: DC | PRN

## 2023-01-30 MED ORDER — HYDROXYZINE HCL 25 MG PO TABS
25.0000 mg | ORAL_TABLET | Freq: Three times a day (TID) | ORAL | Status: DC | PRN
  Administered 2023-01-30 – 2023-01-31 (×2): 25 mg via ORAL
  Filled 2023-01-30 (×2): qty 1

## 2023-01-30 MED ORDER — ACETAMINOPHEN 325 MG PO TABS
650.0000 mg | ORAL_TABLET | Freq: Four times a day (QID) | ORAL | Status: DC | PRN
  Administered 2023-01-31 – 2023-02-01 (×4): 650 mg via ORAL
  Filled 2023-01-30 (×4): qty 2

## 2023-01-30 MED ORDER — FENTANYL CITRATE PF 50 MCG/ML IJ SOSY
50.0000 ug | PREFILLED_SYRINGE | Freq: Once | INTRAMUSCULAR | Status: AC
  Administered 2023-01-30: 50 ug via INTRAVENOUS
  Filled 2023-01-30: qty 1

## 2023-01-30 MED ORDER — PREDNISONE 20 MG PO TABS
40.0000 mg | ORAL_TABLET | Freq: Every day | ORAL | Status: DC
  Filled 2023-01-30: qty 2

## 2023-01-30 MED ORDER — IPRATROPIUM BROMIDE 0.02 % IN SOLN
0.5000 mg | Freq: Once | RESPIRATORY_TRACT | Status: DC
  Filled 2023-01-30 (×2): qty 2.5

## 2023-01-30 MED ORDER — ALBUTEROL SULFATE (2.5 MG/3ML) 0.083% IN NEBU
10.0000 mg/h | INHALATION_SOLUTION | Freq: Once | RESPIRATORY_TRACT | Status: AC
  Administered 2023-01-30: 10 mg/h via RESPIRATORY_TRACT
  Filled 2023-01-30: qty 12

## 2023-01-30 MED ORDER — PANTOPRAZOLE SODIUM 40 MG IV SOLR
40.0000 mg | INTRAVENOUS | Status: DC
  Administered 2023-01-30 – 2023-01-31 (×2): 40 mg via INTRAVENOUS
  Filled 2023-01-30 (×2): qty 10

## 2023-01-30 MED ORDER — ASPIRIN 81 MG PO TBEC
81.0000 mg | DELAYED_RELEASE_TABLET | Freq: Every day | ORAL | Status: DC
  Administered 2023-01-30 – 2023-02-01 (×3): 81 mg via ORAL
  Filled 2023-01-30 (×3): qty 1

## 2023-01-30 MED ORDER — SODIUM CHLORIDE 0.9 % IV SOLN
1.0000 g | INTRAVENOUS | Status: DC
  Administered 2023-01-30 – 2023-02-01 (×3): 1 g via INTRAVENOUS
  Filled 2023-01-30 (×3): qty 10

## 2023-01-30 MED ORDER — ZOLPIDEM TARTRATE 5 MG PO TABS
5.0000 mg | ORAL_TABLET | Freq: Every evening | ORAL | Status: DC | PRN
  Administered 2023-01-30: 5 mg via ORAL
  Filled 2023-01-30: qty 1

## 2023-01-30 MED ORDER — ONDANSETRON HCL 4 MG/2ML IJ SOLN
4.0000 mg | Freq: Four times a day (QID) | INTRAMUSCULAR | Status: DC | PRN
  Administered 2023-01-31: 4 mg via INTRAVENOUS
  Filled 2023-01-30: qty 2

## 2023-01-30 MED ORDER — ARFORMOTEROL TARTRATE 15 MCG/2ML IN NEBU
15.0000 ug | INHALATION_SOLUTION | Freq: Two times a day (BID) | RESPIRATORY_TRACT | Status: DC
  Administered 2023-01-30 – 2023-02-01 (×5): 15 ug via RESPIRATORY_TRACT
  Filled 2023-01-30 (×5): qty 2

## 2023-01-30 MED ORDER — IPRATROPIUM-ALBUTEROL 0.5-2.5 (3) MG/3ML IN SOLN
3.0000 mL | Freq: Four times a day (QID) | RESPIRATORY_TRACT | Status: DC
  Administered 2023-01-30 – 2023-01-31 (×7): 3 mL via RESPIRATORY_TRACT
  Filled 2023-01-30 (×8): qty 3

## 2023-01-30 MED ORDER — BUDESONIDE 0.5 MG/2ML IN SUSP
2.0000 mg | Freq: Two times a day (BID) | RESPIRATORY_TRACT | Status: DC
  Filled 2023-01-30: qty 8

## 2023-01-30 MED ORDER — AZITHROMYCIN 250 MG PO TABS
500.0000 mg | ORAL_TABLET | Freq: Every day | ORAL | Status: DC
  Administered 2023-01-31 – 2023-02-01 (×2): 500 mg via ORAL
  Filled 2023-01-30 (×2): qty 2

## 2023-01-30 MED ORDER — IPRATROPIUM-ALBUTEROL 0.5-2.5 (3) MG/3ML IN SOLN
3.0000 mL | Freq: Once | RESPIRATORY_TRACT | Status: DC
  Filled 2023-01-30 (×2): qty 3

## 2023-01-30 MED ORDER — ONDANSETRON HCL 4 MG/2ML IJ SOLN
4.0000 mg | Freq: Once | INTRAMUSCULAR | Status: AC
  Administered 2023-01-30: 4 mg via INTRAVENOUS
  Filled 2023-01-30: qty 2

## 2023-01-30 MED ORDER — BUDESONIDE 0.5 MG/2ML IN SUSP
0.5000 mg | Freq: Two times a day (BID) | RESPIRATORY_TRACT | Status: DC
  Administered 2023-01-30 – 2023-02-01 (×5): 0.5 mg via RESPIRATORY_TRACT
  Filled 2023-01-30 (×4): qty 2

## 2023-01-30 MED ORDER — POTASSIUM CHLORIDE 10 MEQ/100ML IV SOLN
10.0000 meq | INTRAVENOUS | Status: AC
  Administered 2023-01-30 (×4): 10 meq via INTRAVENOUS
  Filled 2023-01-30 (×4): qty 100

## 2023-01-30 MED ORDER — SODIUM CHLORIDE 0.9 % IV SOLN
500.0000 mg | INTRAVENOUS | Status: AC
  Administered 2023-01-30: 500 mg via INTRAVENOUS
  Filled 2023-01-30: qty 5

## 2023-01-30 MED ORDER — ENOXAPARIN SODIUM 40 MG/0.4ML IJ SOSY
40.0000 mg | PREFILLED_SYRINGE | INTRAMUSCULAR | Status: DC
  Administered 2023-01-30 – 2023-01-31 (×2): 40 mg via SUBCUTANEOUS
  Filled 2023-01-30 (×2): qty 0.4

## 2023-01-30 MED ORDER — METHYLPREDNISOLONE SODIUM SUCC 40 MG IJ SOLR
40.0000 mg | Freq: Two times a day (BID) | INTRAMUSCULAR | Status: DC
  Administered 2023-01-30 – 2023-02-01 (×4): 40 mg via INTRAVENOUS
  Filled 2023-01-30 (×4): qty 1

## 2023-01-30 MED ORDER — ALBUTEROL SULFATE (2.5 MG/3ML) 0.083% IN NEBU: 2.5000 mg | INHALATION_SOLUTION | RESPIRATORY_TRACT | Status: DC | PRN

## 2023-01-30 MED ORDER — METHYLPREDNISOLONE SODIUM SUCC 125 MG IJ SOLR
125.0000 mg | Freq: Once | INTRAMUSCULAR | Status: AC
  Administered 2023-01-30: 125 mg via INTRAVENOUS
  Filled 2023-01-30: qty 2

## 2023-01-30 NOTE — ED Notes (Signed)
ED TO INPATIENT HANDOFF REPORT  ED Nurse Name and Phone #: Clarise [name] [phone]  S Name/Age/Gender Kathleen [name] 62 y.o. female Room/Bed: WA09/WA09  Code Status   Code Status: Full Code  Home/SNF/Other  Patient oriented to: self, place, time, and situation Is this baseline? Yes   Triage Complete: Triage complete  Chief Complaint COPD exacerbation ([place]) [J44.1]  Triage Note Pt c/o SOB ongoing for past 2 days with productive cough. HX COPD.    Allergies Allergies  Allergen Reactions   Bee Venom Anaphylaxis   Peach [Prunus Persica] Anaphylaxis, Swelling and Other (See Comments)    Throat swells , breaks out   Topamax [Topiramate] Other (See Comments)    Hallucinations    Tramadol Nausea Only    Level of Care/Admitting Diagnosis ED Disposition     ED Disposition  Admit   Condition  --   [place]: [place] [100102]  Level of Care: Progressive [102]  Admit to Progressive based on following criteria: RESPIRATORY PROBLEMS hypoxemic/hypercapnic respiratory failure that is responsive to NIPPV (BiPAP) or High Flow Nasal Cannula (6-80 lpm). Frequent assessment/intervention, no > Q2 hrs < Q4 hrs, to maintain oxygenation and pulmonary hygiene.  May admit patient to [facility] or [facility] if equivalent level of care is available:: No  Covid Evaluation: Asymptomatic - no recent exposure (last 10 days) testing not required  Diagnosis: COPD exacerbation [facility]) [id number]  Admitting Physician: Emilee Hero [id number]  Attending Physician: Emilee Hero [id number]  Certification:: I certify this patient will need inpatient services for at least 2 midnights  Estimated Length of Stay: 5          B Medical/Surgery History Past Medical History:  Diagnosis Date   Anxiety    Arthritis    hands   Asthma    2 sets of PFT's in 04/09 without sign variability. Last set with significant decrease in FEV1 with saline alone, suggesting Asthma but   recommended clinical corelation    Asthma    Bipolar 1 disorder [facility])    therapist is Joy and is followed by [facility]   Blackout    negative work up including ESR, ANA, opthalmology referral, carotid dopplers, 2D echo, MRI and EEG.   BREAST LUMP 03/25/2008   Annotation: bilaterally Qualifier: Diagnosis of  By: Tomasa Hosteller MD, Edmon Crape.    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 Further work up includes CRP, ESR, SPEP/UPEP, hepatitis erology, A1C , repeat ANA   COPD (chronic obstructive pulmonary disease) ([place])    COPD exacerbation ([place]) 03/31/2019   Depression    DUB (dysfunctional uterine bleeding)    and pelvic pain, with negative endometrial bx in 07/09 and transvaginal U/S significant for mild fibroids in 0/09.   Emphysema of lung (H[facility])    GERD (gastroesophageal reflux disease)    Hallucinations 03/18/2008   Qualifier: Diagnosis of  By: Redmond Pulling  MD, Valerie     Hyperlipidemia    Lower extremity edema    Neg ABI's, normal 2D echo, normal albumin   Menorrhagia    Ovarian cyst  Polysubstance abuse ([place])    none since March 17,2009.   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   Tubular adenoma of colon    Past Surgical History:  Procedure Laterality Date   CESAREAN SECTION     CESAREAN SECTION     COLONOSCOPY     HERNIA REPAIR     Left partial oophorectomy     OOPHORECTOMY     1/2 ovary removed   POLYPECTOMY     VAGINA SURGERY     mesh     A IV Location/Drains/Wounds Patient Lines/Drains/Airways Status     Active Line/Drains/Airways     Name Placement date Placement time Site Days   Peripheral IV  01/30/23 20 G 1" Left Antecubital 01/30/23  0552  Antecubital  less than 1   Peripheral IV 01/30/23 20 G Anterior;Proximal;Right Forearm 01/30/23  0738  Forearm  less than 1            Intake/Output Last 24 hours  Intake/Output Summary (Last 24 hours) at 01/30/2023 1128 Last data filed at 01/30/2023 1127 Gross per 24 hour  Intake 350 ml  Output --  Net 350 ml    Labs/Imaging Results for orders placed or performed during the hospital encounter of 01/30/23 (from the past 48 hour(s))  CBC with Differential     Status: Abnormal   Collection Time: 01/30/23  5:52 AM  Result Value Ref Range   WBC 6.6 4.0 - 10.5 K/uL   RBC 5.54 (H) 3.87 - 5.11 MIL/uL   Hemoglobin 15.4 (H) 12.0 - 15.0 g/dL   HCT 47.9 (H) 36.0 - 46.0 %   MCV 86.5 80.0 - 100.0 fL   MCH 27.8 26.0 - 34.0 pg   MCHC 32.2 30.0 - 36.0 g/dL   RDW 13.4 11.5 - 15.5 %   Platelets 305 150 - 400 K/uL   nRBC 0.0 0.0 - 0.2 %   Neutrophils Relative % 44 %   Neutro Abs 2.9 1.7 - 7.7 K/uL   Lymphocytes Relative 38 %   Lymphs Abs 2.5 0.7 - 4.0 K/uL   Monocytes Relative 9 %   Monocytes Absolute 0.6 0.1 - 1.0 K/uL   Eosinophils Relative 8 %   Eosinophils Absolute 0.5 0.0 - 0.5 K/uL   Basophils Relative 1 %   Basophils Absolute 0.1 0.0 - 0.1 K/uL   Immature Granulocytes 0 %   Abs Immature Granulocytes 0.02 0.00 - 0.07 K/uL    Comment: Performed at [facility], [place] [address], [place], [place] [id number]  Basic metabolic panel     Status: Abnormal   Collection Time: 01/30/23  5:52 AM  Result Value Ref Range   Sodium 141 135 - 145 mmol/L   Potassium 2.9 (L) 3.5 - 5.1 mmol/L   Chloride 106 98 - 111 mmol/L   CO2 25 22 - 32 mmol/L   Glucose, Bld 93 70 - 99 mg/dL    Comment: Glucose reference range applies only to samples taken after fasting for at least 8 hours.   BUN 12 8 - 23 mg/dL   Creatinine, Ser 0.96 0.44 - 1.00 mg/dL   Calcium 9.0 8.9 - 10.3 mg/dL   GFR, Estimated >60 >60 mL/min    Comment:  (NOTE) Calculated using the CKD-EPI Creatinine Equation (2021)    Anion gap 10 5 - 15    Comment: Performed at [facility],  [place] [address] [place], [place] [address]  Troponin I (High Sensitivity)     Status: None   Collection Time: 01/30/23  5:52 AM  Result Value Ref Range   Troponin I (High Sensitivity) 4 <18 ng/L    Comment: (NOTE) Elevated high sensitivity troponin I (hsTnI) values and significant  changes across serial measurements may suggest ACS but many other  chronic and acute conditions are known to elevate hsTnI results.  Refer to the "Links" section for chest pain algorithms and additional  guidance. Performed at [facility], [place] [address] [place], [place] [address]   Brain natriuretic peptide     Status: None   Collection Time: 01/30/23  5:52 AM  Result Value Ref Range   B Natriuretic Peptide 24.5 0.0 - 100.0 pg/mL    Comment: Performed at [facility], [place] [address], [place], [place] [address]  Blood gas, venous (at [facility])     Status: Abnormal   Collection Time: 01/30/23  5:52 AM  Result Value Ref Range   pH, Ven 7.41 7.25 - 7.43   pCO2, Ven 46 44 - 60 mmHg   pO2, Ven 31 (LL) 32 - 45 mmHg    Comment: CRITICAL RESULT CALLED TO, READ BACK BY AND VERIFIED WITH: [place],N AT [id number] ON 01/30/23 BY LUZOLOP    Bicarbonate 29.2 (H) 20.0 - 28.0 mmol/L   Acid-Base Excess 3.7 (H) 0.0 - 2.0 mmol/L   O2 Saturation 52.8 %   Patient temperature 37.0     Comment: Performed at [facility], [place] [address], [place], [place] [address]  Resp panel by RT-PCR (RSV, Flu A&B, Covid) Anterior Nasal Swab     Status: None   Collection Time: 01/30/23  6:06 AM   Specimen: Anterior Nasal Swab  Result Value Ref Range   SARS Coronavirus 2 by RT PCR NEGATIVE NEGATIVE    Comment: (NOTE) SARS-CoV-2 target nucleic acids are NOT DETECTED.  The SARS-CoV-2 RNA is generally detectable in upper  respiratory specimens during the acute phase of infection. The lowest concentration of SARS-CoV-2 viral copies this assay can detect is 138 copies/mL. A negative result does not preclude SARS-Cov-2 infection and should not be used as the sole basis for treatment or other patient management decisions. A negative result may occur with  improper specimen collection/handling, submission of specimen other than nasopharyngeal swab, presence of viral mutation(s) within the areas targeted by this assay, and inadequate number of viral copies(<138 copies/mL). A negative result must be combined with clinical observations, patient history, and epidemiological information. The expected result is Negative.  Fact Sheet for Patients:  EntrepreneurPulse.com.au  Fact Sheet for Healthcare Providers:  IncredibleEmployment.be  This test is no t yet approved or cleared by the [place] FDA and  has been authorized for detection and/or diagnosis of SARS-CoV-2 by FDA under an Emergency Use Authorization (EUA). This EUA will remain  in effect (meaning this test can be used) for the duration of the COVID-19 declaration under Section 564(b)(1) of the Act, 21 U.S.C.section 360bbb-3(b)(1), unless the authorization is terminated  or revoked sooner.       Influenza A by PCR NEGATIVE NEGATIVE   Influenza B by PCR NEGATIVE NEGATIVE    Comment: (NOTE) The Xpert Xpress SARS-CoV-2/FLU/RSV plus assay is intended as an aid in the diagnosis of influenza from Nasopharyngeal swab specimens and should not be used as a sole basis for treatment. Nasal washings and aspirates are unacceptable for Xpert Xpress SARS-CoV-2/FLU/RSV  testing.  Fact Sheet for Patients: EntrepreneurPulse.com.au  Fact Sheet for Healthcare Providers: IncredibleEmployment.be  This test is not yet approved or cleared by the [place] FDA and has been authorized for  detection and/or diagnosis of SARS-CoV-2 by FDA under an Emergency Use Authorization (EUA). This EUA will remain in effect (meaning this test can be used) for the duration of the COVID-19 declaration under Section 564(b)(1) of the Act, 21 U.S.C. section 360bbb-3(b)(1), unless the authorization is terminated or revoked.     Resp Syncytial Virus by PCR NEGATIVE NEGATIVE    Comment: (NOTE) Fact Sheet for Patients: EntrepreneurPulse.com.au  Fact Sheet for Healthcare Providers: IncredibleEmployment.be  This test is not yet approved or cleared by the [place] FDA and has been authorized for detection and/or diagnosis of SARS-CoV-2 by FDA under an Emergency Use Authorization (EUA). This EUA will remain in effect (meaning this test can be used) for the duration of the COVID-19 declaration under Section 564(b)(1) of the Act, 21 U.S.C. section 360bbb-3(b)(1), unless the authorization is terminated or revoked.  Performed at [facility], [place] [address], [place], [place] [address]   Creatinine, serum     Status: None   Collection Time: 01/30/23  7:13 AM  Result Value Ref Range   Creatinine, Ser 0.89 0.44 - 1.00 mg/dL   GFR, Estimated >60 >60 mL/min    Comment: (NOTE) Calculated using the CKD-EPI Creatinine Equation (2021) Performed at [facility], [place] [address], [place], [place] [address]   Troponin I (High Sensitivity)     Status: None   Collection Time: 01/30/23  7:47 AM  Result Value Ref Range   Troponin I (High Sensitivity) 3 <18 ng/L    Comment: (NOTE) Elevated high sensitivity troponin I (hsTnI) values and significant  changes across serial measurements may suggest ACS but many other  chronic and acute conditions are known to elevate hsTnI results.  Refer to the "Links" section for chest pain algorithms and additional  guidance. Performed at [facility], [place] [address], [place], [place] [address]    CT HEAD WO CONTRAST (5MM)  Result Date: 01/30/2023 CLINICAL DATA:  Mental status change of unknown cause. EXAM: CT HEAD WITHOUT CONTRAST TECHNIQUE: Contiguous axial images were obtained from the base of the skull through the vertex without intravenous contrast. RADIATION DOSE REDUCTION: This exam was performed according to the departmental dose-optimization program which includes automated exposure control, adjustment of the mA and/or kV according to patient size and/or use of iterative reconstruction technique. COMPARISON:  08/30/2022. FINDINGS: Brain: No evidence of acute infarction, hemorrhage, hydrocephalus, extra-axial collection or mass lesion/mass effect. Vascular: No hyperdense vessel or unexpected calcification. Skull: Normal. Negative for fracture or focal lesion. Sinuses/Orbits: Globes and orbits are unremarkable. Scattered mild mucosal thickening noted throughout the ethmoid and bilateral maxillary sinuses. Minor mucosal thickening along the anterior right sphenoid sinus. Other: None. IMPRESSION: 1. No intracranial abnormality. 2. Sinus mucosal thickening. Electronically Signed   By: Lajean Manes M.D.   On: 01/30/2023 09:53   DG Chest Portable 1 View  Result Date: 01/30/2023 CLINICAL DATA:  Shortness of breath for 2 days with productive cough. EXAM: PORTABLE CHEST 1 VIEW COMPARISON:  01/26/2023 FINDINGS: Heart size and mediastinal contours are unremarkable. There is no pleural effusion or edema. No airspace opacities identified. Visualized osseous structures are unremarkable. IMPRESSION: No active disease. Electronically Signed   By: Kerby Moors M.D.   On: 01/30/2023 06:09    Pending Labs Unresulted Labs (From admission,  onward)     Start     Ordered   02/06/23 0500  Creatinine, serum  (enoxaparin (LOVENOX)    CrCl >/= 30 ml/min)  Weekly,   R     Comments: while on enoxaparin therapy    01/30/23 0715   01/31/23 0500  CBC  Daily,   R      01/30/23  0730   01/31/23 [id number]  Basic metabolic panel  Daily,   R      01/30/23 0730   01/30/23 0830  CBC  Once,   STAT        01/30/23 0830   01/30/23 0713  HIV Antibody (routine testing w rflx)  (HIV Antibody (Routine testing w reflex) panel)  Once,   R        01/30/23 0715            Vitals/Pain Today's Vitals   01/30/23 0744 01/30/23 0913 01/30/23 1010 01/30/23 1027  BP:    120/84  Pulse: 96   (!) 107  Resp:    19  Temp:   98 F (36.7 C)   TempSrc:   Oral   SpO2: 97%   96%  PainSc:  4       Isolation Precautions Airborne and Contact precautions  Medications Medications  ipratropium-albuterol (DUONEB) 0.5-2.5 (3) MG/3ML nebulizer solution 3 mL (3 mLs Nebulization Not Given 01/30/23 0600)  ipratropium (ATROVENT) nebulizer solution 0.5 mg (0.5 mg Nebulization Not Given 01/30/23 0600)  aspirin EC tablet 81 mg (81 mg Oral Given 01/30/23 1022)  enoxaparin (LOVENOX) injection 40 mg (40 mg Subcutaneous Given 01/30/23 1023)  azithromycin (ZITHROMAX) 500 mg in sodium chloride 0.9 % 250 mL IVPB (0 mg Intravenous Stopped 01/30/23 1127)    Followed by  azithromycin (ZITHROMAX) tablet 500 mg (has no administration in time range)  ipratropium-albuterol (DUONEB) 0.5-2.5 (3) MG/3ML nebulizer solution 3 mL (3 mLs Nebulization Given 01/30/23 0737)  arformoterol (BROVANA) nebulizer solution 15 mcg (15 mcg Nebulization Given 01/30/23 0736)  cefTRIAXone (ROCEPHIN) 1 g in sodium chloride 0.9 % 100 mL IVPB (0 g Intravenous Stopped 01/30/23 0837)  potassium chloride 10 mEq in 100 mL IVPB (10 mEq Intravenous New Bag/Given 01/30/23 1024)  methylPREDNISolone sodium succinate (SOLU-MEDROL) 40 mg/mL injection 40 mg (has no administration in time range)  albuterol (PROVENTIL) (2.5 MG/3ML) 0.083% nebulizer solution 2.5 mg (has no administration in time range)  ondansetron (ZOFRAN) injection 4 mg (has no administration in time range)  acetaminophen (TYLENOL) tablet 650 mg (has no administration in time range)   morphine (PF) 2 MG/ML injection 1 mg (1 mg Intravenous Given 01/30/23 0838)  hydrOXYzine (ATARAX) tablet 25 mg (has no administration in time range)  pantoprazole (PROTONIX) injection 40 mg (has no administration in time range)  budesonide (PULMICORT) nebulizer solution 0.5 mg (0.5 mg Nebulization Given 01/30/23 0751)  methylPREDNISolone sodium succinate (SOLU-MEDROL) 125 mg/2 mL injection 125 mg (125 mg Intravenous Given 01/30/23 0556)  magnesium sulfate IVPB 2 g 50 mL (0 g Intravenous Stopped 01/30/23 0714)  albuterol (PROVENTIL) (2.5 MG/3ML) 0.083% nebulizer solution (10 mg/hr Nebulization Given 01/30/23 0600)  ondansetron (ZOFRAN) injection 4 mg (4 mg Intravenous Given 01/30/23 0603)  fentaNYL (SUBLIMAZE) injection 50 mcg (50 mcg Intravenous Given 01/30/23 0650)    Mobility walks     Focused Assessments    R Recommendations: See Admitting Provider Note  Report given to:   Additional Notes:

## 2023-01-30 NOTE — ED Notes (Signed)
Patient complains of sudden abdominal pain, rated 10/10. MD made aware.

## 2023-01-30 NOTE — ED Notes (Signed)
EDP Rancour at bedside

## 2023-01-30 NOTE — ED Notes (Signed)
Attempted to call patients son, Darnelle Maffucci a patients request. No answer at this time.

## 2023-01-30 NOTE — ED Notes (Signed)
Pt brought straight back to room 9 d/t concerns for SOB. Pt having to lean forward in chair d/t difficulty breathing.

## 2023-01-30 NOTE — ED Triage Notes (Signed)
Pt c/o SOB ongoing for past 2 days with productive cough. HX COPD.

## 2023-01-30 NOTE — ED Notes (Signed)
RT at bedside.

## 2023-01-30 NOTE — ED Provider Notes (Signed)
[facility] Provider Note   CSN: [id number] Arrival date & time: 01/30/23  0534     History  Chief Complaint  Patient presents with   Shortness of Breath    Kathleen [name] is a 62 y.o. female.  Level 5 caveat for respiratory distress.  Patient arrives POV with difficulty breathing, cough and shortness of breath for the past 2 days.  Does have a history of COPD.  On arrival she is tachypneic, tripoding, speaking in short phrases.  She states she has been using her albuterol at home every 4 hours for the last 2 days.  Was seen in this ED on February 14 and states that she completed a course of steroids.  Breathing has worsened again over the past 2 days and has a cough productive of clear mucus.  Some intermittent chest pain with coughing.  No significant leg pain or leg swelling.  No abdominal pain, nausea or vomiting.  No fever. Has required BiPAP as well as intubation in the past.  The history is provided by the patient. The history is limited by the condition of the patient.  Shortness of Breath      Home Medications Prior to Admission medications   Medication Sig Start Date End Date Taking? Authorizing Provider  albuterol (PROVENTIL) (2.5 MG/3ML) 0.083% nebulizer solution Take 3 mLs (2.5 mg total) by nebulization every 4 (four) hours as needed for wheezing or shortness of breath. 11/17/22   Kathie Dike, MD  albuterol (VENTOLIN HFA) 108 (90 Base) MCG/ACT inhaler Inhale 2 puffs into the lungs every 6 (six) hours as needed for wheezing or shortness of breath. Patient taking differently: Inhale 2 puffs into the lungs every 2 (two) hours as needed for wheezing or shortness of breath. 10/06/22   Riesa Pope, MD  aspirin EC 81 MG tablet Take 81 mg by mouth daily. Swallow whole.    [provider]  EPINEPHrine (EPIPEN 2-PAK) 0.3 mg/0.3 mL IJ SOAJ injection Inject 0.3 mg into the muscle as needed for anaphylaxis. 07/30/22    Lacinda Axon, MD  Fluticasone-Umeclidin-Vilant (TRELEGY ELLIPTA) 200-62.5-25 MCG/ACT AEPB Inhale 2 puffs into the lungs daily. 11/17/22   Kathie Dike, MD  guaiFENesin-dextromethorphan (ROBITUSSIN DM) 100-10 MG/5ML syrup Take 10 mLs by mouth every 4 (four) hours as needed for cough. 10/28/22   Terrilee Croak, MD  hydrOXYzine (ATARAX) 25 MG tablet Take 1 tablet (25 mg total) by mouth 3 (three) times daily as needed for anxiety. 11/17/22   Kathie Dike, MD  ipratropium-albuterol (DUONEB) 0.5-2.5 (3) MG/3ML SOLN Take 3 mLs by nebulization every 2 (two) hours as needed. 01/26/23   Charlesetta Shanks, MD  lidocaine (LIDODERM) 5 % Place 1 patch onto the skin daily. Remove & Discard patch within 12 hours or as directed by MD Patient taking differently: Place 2 patches onto the skin See admin instructions. Apply 2 patches to the lower back once a day- Remove & Discard patch within 12 hours or as directed by MD 07/30/22   Lacinda Axon, MD  loratadine (CLARITIN) 10 MG tablet Take 1 tablet (10 mg total) by mouth daily. Patient taking differently: Take 10 mg by mouth at bedtime. 03/09/22   Hunsucker, [name], MD  methocarbamol (ROBAXIN) 500 MG tablet Take 1 tablet (500 mg total) by mouth every 6 (six) hours as needed for muscle spasms. 11/17/22   Kathie Dike, MD  montelukast (SINGULAIR) 10 MG tablet Take 1 tablet (10 mg total) by mouth at  bedtime. 07/30/22   Lacinda Axon, MD  MUCINEX FAST-MAX [phone] MG/20ML LIQD Take 15 mLs by mouth every 4 (four) hours as needed (to loosen phlegm).    [provider]  Multiple Vitamin (MULTIVITAMIN WITH MINERALS) TABS tablet Take 1 tablet by mouth daily. 08/20/22   Hosie Poisson, MD  Nicotine 21-14-7 MG/24HR KIT Take as prescribed 11/17/22   Kathie Dike, MD  ondansetron (ZOFRAN) 4 MG tablet TAKE 1 TABLET BY MOUTH ONCE DAILY AS NEEDED FOR NAUSEA FOR VOMITING Patient taking differently: Take 4 mg by mouth daily as needed for nausea or vomiting.  02/18/21   [name], MD  oxyCODONE (OXY IR/ROXICODONE) 5 MG immediate release tablet Take 1 tablet (5 mg total) by mouth every 4 (four) hours as needed for severe pain. 11/17/22   Kathie Dike, MD  pantoprazole (PROTONIX) 40 MG tablet Take 1 tablet (40 mg total) by mouth 2 (two) times daily before a meal. 11/17/22   Kathie Dike, MD  predniSONE (DELTASONE) 10 MG tablet Take 51m po daily for 2 days then 362mdaily for 2 days then 2065maily for 2 days then 31m6mily for 2 days then stop 11/17/22   MemoKathie Dike  predniSONE (DELTASONE) 20 MG tablet 2 tabs po daily x 4 days 01/27/23   PfeiCharlesetta Shanks      Allergies    Bee venom, Peach [prunus persica], Hydroxyzine, Topamax [topiramate], and Tramadol    Review of Systems   Review of Systems  Unable to perform ROS: Severe respiratory distress  Respiratory:  Positive for shortness of breath.     Physical Exam Updated Vital Signs BP (!) 120/99   Pulse (!) 109   Temp (!) 97.5 F (36.4 C) (Oral)   Resp (!) 37   LMP 11/08/2014   SpO2 92%  Physical Exam Vitals and nursing note reviewed.  Constitutional:      General: She is in acute distress.     Appearance: She is well-developed. She is ill-appearing.     Comments: Moderate respiratory distress, speaking in short phrases  HENT:     Head: Normocephalic and atraumatic.     Mouth/Throat:     Pharynx: No oropharyngeal exudate.  Eyes:     Conjunctiva/sclera: Conjunctivae normal.     Pupils: Pupils are equal, round, and reactive to light.  Neck:     Comments: No meningismus. Cardiovascular:     Rate and Rhythm: Regular rhythm. Tachycardia present.     Heart sounds: Normal heart sounds. No murmur heard.    Comments: Tripoding, sitting on edge of bed, pursed lip breathing Pulmonary:     Effort: Respiratory distress present.     Breath sounds: Wheezing present.     Comments: Diminished with expiratory wheezing bilaterally Abdominal:     Palpations: Abdomen is soft.      Tenderness: There is no abdominal tenderness. There is no guarding or rebound.  Musculoskeletal:        General: No tenderness. Normal range of motion.     Cervical back: Normal range of motion and neck supple.     Right lower leg: No edema.     Left lower leg: No edema.  Skin:    General: Skin is warm.  Neurological:     Mental Status: She is alert and oriented to person, place, and time.     Cranial Nerves: No cranial nerve deficit.     Motor: No abnormal muscle tone.     Coordination: Coordination normal.  Comments:  5/5 strength throughout. CN 2-12 intact.Equal grip strength.   Psychiatric:        Behavior: Behavior normal.     ED Results / Procedures / Treatments   Labs (all labs ordered are listed, but only abnormal results are displayed) Labs Reviewed  CBC WITH DIFFERENTIAL/PLATELET - Abnormal; Notable for the following components:      Result Value   RBC 5.54 (*)    Hemoglobin 15.4 (*)    HCT 47.9 (*)    All other components within normal limits  BASIC METABOLIC PANEL - Abnormal; Notable for the following components:   Potassium 2.9 (*)    All other components within normal limits  BLOOD GAS, VENOUS - Abnormal; Notable for the following components:   pO2, Ven 31 (*)    Bicarbonate 29.2 (*)    Acid-Base Excess 3.7 (*)    All other components within normal limits  RESP PANEL BY RT-PCR (RSV, FLU A&B, COVID)  RVPGX2  BRAIN NATRIURETIC PEPTIDE  HIV ANTIBODY (ROUTINE TESTING W REFLEX)  CBC  CREATININE, SERUM  TROPONIN I (HIGH SENSITIVITY)  TROPONIN I (HIGH SENSITIVITY)    EKG EKG Interpretation  Date/Time:  Sunday January 30 2023 06:09:55 EST Ventricular Rate:  98 PR Interval:  142 QRS Duration: 85 QT Interval:  418 QTC Calculation: 534 R Axis:   34 Text Interpretation: Sinus rhythm Low voltage, precordial leads Probable anteroseptal infarct, old Prolonged QT interval Baseline wander in lead(s) II Prolonged QT Confirmed by Ezequiel Essex [phone]) on  01/30/2023 6:12:24 AM  Radiology DG Chest Portable 1 View  Result Date: 01/30/2023 CLINICAL DATA:  Shortness of breath for 2 days with productive cough. EXAM: PORTABLE CHEST 1 VIEW COMPARISON:  01/26/2023 FINDINGS: Heart size and mediastinal contours are unremarkable. There is no pleural effusion or edema. No airspace opacities identified. Visualized osseous structures are unremarkable. IMPRESSION: No active disease. Electronically Signed   By: Kerby Moors M.D.   On: 01/30/2023 06:09    Procedures .Critical Care  Performed by: Ezequiel Essex, MD Authorized by: Ezequiel Essex, MD   Critical care provider statement:    Critical care time (minutes):  45   Critical care time was exclusive of:  Separately billable procedures and treating other patients   Critical care was necessary to treat or prevent imminent or life-threatening deterioration of the following conditions:  Respiratory failure   Critical care was time spent personally by me on the following activities:  Development of treatment plan with patient or surrogate, discussions with consultants, evaluation of patient's response to treatment, examination of patient, ordering and review of laboratory studies, ordering and review of radiographic studies, ordering and performing treatments and interventions, pulse oximetry, re-evaluation of patient's condition, review of old charts, blood draw for specimens and obtaining history from patient or surrogate   I assumed direction of critical care for this patient from another provider in my specialty: no     Care discussed with: admitting provider       Medications Ordered in ED Medications  magnesium sulfate IVPB 2 g 50 mL (2 g Intravenous New Bag/Given 01/30/23 0556)  ipratropium-albuterol (DUONEB) 0.5-2.5 (3) MG/3ML nebulizer solution 3 mL (3 mLs Nebulization Not Given 01/30/23 0600)  ipratropium (ATROVENT) nebulizer solution 0.5 mg (0.5 mg Nebulization Not Given 01/30/23 0600)   methylPREDNISolone sodium succinate (SOLU-MEDROL) 125 mg/2 mL injection 125 mg (125 mg Intravenous Given 01/30/23 0556)  albuterol (PROVENTIL) (2.5 MG/3ML) 0.083% nebulizer solution (10 mg/hr Nebulization Given 01/30/23 0600)  ondansetron (ZOFRAN) injection  4 mg (4 mg Intravenous Given 01/30/23 0603)    ED Course/ Medical Decision Making/ A&P                             Medical Decision Making Amount and/or Complexity of Data Reviewed Labs: ordered. Decision-making details documented in ED Course. Radiology: ordered and independent interpretation performed. Decision-making details documented in ED Course. ECG/medicine tests: ordered and independent interpretation performed. Decision-making details documented in ED Course.  Risk Prescription drug management. Decision regarding hospitalization.   Respiratory distress likely secondary to COPD exacerbation.  Tachypneic and tripoding on arrival.  Initiated bronchodilators, steroids, magnesium.  Placed on BiPAP.  Obtain chest x-ray, EKG and labs.  Chest x-ray shows no infiltrate or pneumothorax.  Results reviewed interpreted by me.  Work of breathing has improved on BiPAP.  Continue bronchodilators, steroids and magnesium.  ABG does not show any significant CO2 retention.  Electrolytes are reassuring.  Does show mild hypokalemia which will be replaced.  Patient would benefit from admission to the hospital for suspected COPD exacerbation.  Mental status is stable on BiPAP.  Work of breathing and wheezing have improved with improved air exchange.  Admission to this with Dr. Annie Paras.         Final Clinical Impression(s) / ED Diagnoses Final diagnoses:  COPD exacerbation [facility])    Rx / DC Orders ED Discharge Orders     None         Angelis Gates, Annie Main, MD 01/30/23 [phone]
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
## 2023-01-31 ENCOUNTER — Encounter (HOSPITAL_COMMUNITY): Payer: Self-pay | Admitting: Internal Medicine

## 2023-01-31 DIAGNOSIS — J441 Chronic obstructive pulmonary disease with (acute) exacerbation: Secondary | ICD-10-CM | POA: Diagnosis not present

## 2023-01-31 LAB — BASIC METABOLIC PANEL
Anion gap: 8 (ref 5–15)
BUN: 13 mg/dL (ref 8–23)
CO2: 26 mmol/L (ref 22–32)
Calcium: 8.5 mg/dL — ABNORMAL LOW (ref 8.9–10.3)
Chloride: 104 mmol/L (ref 98–111)
Creatinine, Ser: 0.78 mg/dL (ref 0.44–1.00)
GFR, Estimated: >60 mL/min (ref >60)
Glucose, Bld: 105 mg/dL — ABNORMAL HIGH (ref 70–99)
Potassium: 3.9 mmol/L (ref 3.5–5.1)
Sodium: 138 mmol/L (ref 135–145)

## 2023-01-31 LAB — CBC
HCT: 43.5 % (ref 36.0–46.0)
Hemoglobin: 13.8 g/dL (ref 12.0–15.0)
MCH: 28.3 pg (ref 26.0–34.0)
MCHC: 31.7 g/dL (ref 30.0–36.0)
MCV: 89.1 fL (ref 80.0–100.0)
Platelets: 288 10*3/uL (ref 150–400)
RBC: 4.88 MIL/uL (ref 3.87–5.11)
RDW: 13.4 % (ref 11.5–15.5)
WBC: 11.4 10*3/uL — ABNORMAL HIGH (ref 4.0–10.5)
nRBC: 0 % (ref 0.0–0.2)

## 2023-01-31 LAB — URINALYSIS, ROUTINE W REFLEX MICROSCOPIC
Bilirubin Urine: NEGATIVE
Glucose, UA: NEGATIVE mg/dL
Hgb urine dipstick: NEGATIVE
Ketones, ur: NEGATIVE mg/dL
Nitrite: NEGATIVE
Protein, ur: NEGATIVE mg/dL
Specific Gravity, Urine: 1.023 (ref 1.005–1.030)
WBC, UA: >50 WBC/hpf (ref 0–5)
pH: 5 (ref 5.0–8.0)

## 2023-01-31 MED ORDER — HYDROXYZINE HCL 25 MG PO TABS
25.0000 mg | ORAL_TABLET | Freq: Three times a day (TID) | ORAL | Status: DC
  Administered 2023-01-31 – 2023-02-01 (×3): 25 mg via ORAL
  Filled 2023-01-31 (×3): qty 1

## 2023-01-31 MED ORDER — METHOCARBAMOL 500 MG PO TABS
500.0000 mg | ORAL_TABLET | Freq: Four times a day (QID) | ORAL | Status: DC | PRN
  Administered 2023-01-31 – 2023-02-01 (×2): 500 mg via ORAL
  Filled 2023-01-31 (×2): qty 1

## 2023-01-31 MED ORDER — LIDOCAINE 5 % EX PTCH
2.0000 | MEDICATED_PATCH | Freq: Every day | CUTANEOUS | Status: DC
  Administered 2023-01-31 – 2023-02-01 (×2): 2 via TRANSDERMAL
  Filled 2023-01-31 (×2): qty 2

## 2023-01-31 MED ORDER — PANTOPRAZOLE SODIUM 40 MG PO TBEC
40.0000 mg | DELAYED_RELEASE_TABLET | Freq: Two times a day (BID) | ORAL | Status: DC
  Administered 2023-01-31 – 2023-02-01 (×2): 40 mg via ORAL
  Filled 2023-01-31 (×2): qty 1

## 2023-01-31 MED ORDER — IPRATROPIUM-ALBUTEROL 0.5-2.5 (3) MG/3ML IN SOLN
3.0000 mL | Freq: Three times a day (TID) | RESPIRATORY_TRACT | Status: DC
  Administered 2023-02-01: 3 mL via RESPIRATORY_TRACT
  Filled 2023-01-31 (×2): qty 3

## 2023-01-31 MED ORDER — GUAIFENESIN-DM 100-10 MG/5ML PO SYRP
10.0000 mL | ORAL_SOLUTION | ORAL | Status: DC | PRN
  Administered 2023-01-31 – 2023-02-01 (×2): 10 mL via ORAL
  Filled 2023-01-31 (×3): qty 10

## 2023-01-31 MED ORDER — ENSURE ENLIVE PO LIQD
237.0000 mL | Freq: Two times a day (BID) | ORAL | Status: DC
  Administered 2023-01-31 – 2023-02-01 (×3): 237 mL via ORAL
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
## 2023-01-31 NOTE — Progress Notes (Signed)
Initial Nutrition Assessment  DOCUMENTATION CODES:   Not applicable  INTERVENTION:  - Regular diet.  - Ensure Plus High Protein po BID, each supplement provides 350 kcal and 20 grams of protein. - Monitor weight trends.  Nutrition status stable at this time, will sign off. Please re-consult if needed.   NUTRITION DIAGNOSIS:   Increased nutrient needs related to chronic illness (COPD) as evidenced by estimated needs.  GOAL:   Patient will meet greater than or equal to 90% of their needs  MONITOR:   PO intake, Supplement acceptance, Diet advancement, Weight trends  REASON FOR ASSESSMENT:   Consult Assessment of nutrition requirement/status  ASSESSMENT:   62 y.o. female with medical history significant for COPD/asthma, tobacco abuse, Bipolar 1 and anxiety admitted with  acute exacerbation of COPD  Patient reports a UBW of 160# and endorses weight loss to 120# over the past 2 months due to "neck stones". Per EMR, pt weighed at 133# in May 2023 and gained to 149# by December, which she has been stable at since that time.   Patient reports she eats "constantly" throughout the day as she never knows when she will be able to eat well with the "neck stones". Shares she had them removed but they have started to come back. They occasionally make her have N/V and regurgitation.   Thankfully, she reports no issues since admission and that she has a good appetite. Documented to be consuming 75-100% of meals since admit. She enjoys Ensure and would like to receive to support oral intake. Patient also inquiring if there is a way to receive Ensure for free or discounted price at home. Provided patient with Ensure coupons to utilize after discharge.   Medications reviewed and include: -  Labs reviewed:  -   NUTRITION - FOCUSED PHYSICAL EXAM:  Flowsheet Row Most Recent Value  Orbital Region Mild depletion  Upper Arm Region No depletion  Thoracic and Lumbar Region No depletion   Buccal Region No depletion  Temple Region Mild depletion  Clavicle Bone Region Mild depletion  Clavicle and Acromion Bone Region Mild depletion  Scapular Bone Region Unable to assess  Dorsal Hand No depletion  Patellar Region No depletion  Anterior Thigh Region No depletion  Posterior Calf Region No depletion  Edema (RD Assessment) None  Hair Reviewed  Eyes Reviewed  Mouth Reviewed  Skin Reviewed  Nails Reviewed       Diet Order:   Diet Order             Diet regular Room service appropriate? Yes; Fluid consistency: Thin  Diet effective now                   EDUCATION NEEDS:  Education needs have been addressed  Skin:  Skin Assessment: Reviewed RN Assessment  Last BM:  2/18  Height:  Ht Readings from Last 1 Encounters:  01/31/23 5' 3"$  (1.6 m)   Weight:  Wt Readings from Last 1 Encounters:  01/26/23 67.8 kg    BMI:  Body mass index is 26.48 kg/m.  Estimated Nutritional Needs:  Kcal:  [id number] kcals Protein:  75-90 grams Fluid:  >/= 1.9L    Samson Frederic RD, LDN For contact information, refer to [facility].

## 2023-01-31 NOTE — Progress Notes (Signed)
PT Cancellation Note  Patient Details Name: Kathleen [name] MRN: [id number] DOB: Oct 11, 1961   Cancelled Treatment:    Reason Eval/Treat Not Completed: Patient declined, no reason specified   Doreatha Massed, PT Acute Rehabilitation  Office: [phone]
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
## 2023-01-31 NOTE — Progress Notes (Signed)
Asked PT about cpap she states she wore it last night, but doesn't want to use it tonight, instructed PT to call if she changed her mind.

## 2023-02-01 ENCOUNTER — Other Ambulatory Visit (HOSPITAL_COMMUNITY): Payer: Self-pay

## 2023-02-01 ENCOUNTER — Other Ambulatory Visit: Payer: Self-pay

## 2023-02-01 DIAGNOSIS — J441 Chronic obstructive pulmonary disease with (acute) exacerbation: Secondary | ICD-10-CM | POA: Diagnosis not present

## 2023-02-01 LAB — CBC
HCT: 40.4 % (ref 36.0–46.0)
Hemoglobin: 13 g/dL (ref 12.0–15.0)
MCH: 28.4 pg (ref 26.0–34.0)
MCHC: 32.2 g/dL (ref 30.0–36.0)
MCV: 88.4 fL (ref 80.0–100.0)
Platelets: 267 10*3/uL (ref 150–400)
RBC: 4.57 MIL/uL (ref 3.87–5.11)
RDW: 13.3 % (ref 11.5–15.5)
WBC: 12.7 10*3/uL — ABNORMAL HIGH (ref 4.0–10.5)
nRBC: 0 % (ref 0.0–0.2)

## 2023-02-01 LAB — BASIC METABOLIC PANEL
Anion gap: 6 (ref 5–15)
BUN: 20 mg/dL (ref 8–23)
CO2: 27 mmol/L (ref 22–32)
Calcium: 8.7 mg/dL — ABNORMAL LOW (ref 8.9–10.3)
Chloride: 105 mmol/L (ref 98–111)
Creatinine, Ser: 0.92 mg/dL (ref 0.44–1.00)
GFR, Estimated: >60 mL/min (ref >60)
Glucose, Bld: 110 mg/dL — ABNORMAL HIGH (ref 70–99)
Potassium: 4.1 mmol/L (ref 3.5–5.1)
Sodium: 138 mmol/L (ref 135–145)

## 2023-02-01 LAB — URINE CULTURE: Culture: NO GROWTH

## 2023-02-01 MED ORDER — GUAIFENESIN-DM 100-10 MG/5ML PO SYRP
10.0000 mL | ORAL_SOLUTION | ORAL | 0 refills | Status: DC | PRN
  Filled 2023-02-01: qty 118, 2d supply, fill #0

## 2023-02-01 MED ORDER — ALBUTEROL SULFATE HFA 108 (90 BASE) MCG/ACT IN AERS
2.0000 | INHALATION_SPRAY | Freq: Four times a day (QID) | RESPIRATORY_TRACT | 2 refills | Status: DC | PRN
  Filled 2023-02-01 (×2): qty 6.7, 25d supply, fill #0

## 2023-02-01 MED ORDER — NICOTINE 21-14-7 MG/24HR TD KIT
PACK | TRANSDERMAL | 0 refills | Status: DC
  Filled 2023-02-01: qty 1, 30d supply, fill #0

## 2023-02-01 MED ORDER — METHOCARBAMOL 500 MG PO TABS
500.0000 mg | ORAL_TABLET | Freq: Four times a day (QID) | ORAL | 0 refills | Status: DC | PRN
  Filled 2023-02-01: qty 30, 8d supply, fill #0

## 2023-02-01 MED ORDER — PANTOPRAZOLE SODIUM 40 MG PO TBEC
40.0000 mg | DELAYED_RELEASE_TABLET | Freq: Two times a day (BID) | ORAL | 1 refills | Status: DC
  Filled 2023-02-01 (×2): qty 60, 30d supply, fill #0

## 2023-02-01 MED ORDER — LIDOCAINE 5 % EX PTCH
1.0000 | MEDICATED_PATCH | CUTANEOUS | 5 refills | Status: DC
  Filled 2023-02-01: qty 30, 30d supply, fill #0

## 2023-02-01 MED ORDER — PREDNISONE 10 MG PO TABS
ORAL_TABLET | ORAL | 0 refills | Status: AC
  Filled 2023-02-01 (×3): qty 20, 8d supply, fill #0

## 2023-02-01 MED ORDER — MONTELUKAST SODIUM 10 MG PO TABS
10.0000 mg | ORAL_TABLET | Freq: Every day | ORAL | 3 refills | Status: DC
  Filled 2023-02-01: qty 90, 90d supply, fill #0

## 2023-02-01 MED ORDER — LORATADINE 10 MG PO TABS
10.0000 mg | ORAL_TABLET | Freq: Every day | ORAL | 11 refills | Status: DC
  Filled 2023-02-01: qty 30, 30d supply, fill #0

## 2023-02-01 MED ORDER — AZITHROMYCIN 250 MG PO TABS
250.0000 mg | ORAL_TABLET | Freq: Every day | ORAL | 0 refills | Status: DC
  Filled 2023-02-01 (×3): qty 3, 3d supply, fill #0

## 2023-02-01 MED ORDER — TRELEGY ELLIPTA 200-62.5-25 MCG/ACT IN AEPB
2.0000 | INHALATION_SPRAY | Freq: Every day | RESPIRATORY_TRACT | 1 refills | Status: DC
  Filled 2023-02-01 (×2): qty 60, 30d supply, fill #0

## 2023-02-01 MED ORDER — EPINEPHRINE 0.3 MG/0.3ML IJ SOAJ
0.3000 mg | INTRAMUSCULAR | 3 refills | Status: DC | PRN
  Filled 2023-02-01: qty 2, 30d supply, fill #0
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
## 2023-02-03 ENCOUNTER — Other Ambulatory Visit: Payer: Self-pay

## 2023-02-03 ENCOUNTER — Telehealth (HOSPITAL_COMMUNITY): Payer: Self-pay | Admitting: Pharmacy Technician

## 2023-02-03 NOTE — Telephone Encounter (Signed)
Patient Advocate Encounter  Prior Authorization for Lidocaine 5% Transdermal Patch has been approved.    PA# [id number] Effective dates: 02/02/2023 through 02/03/2024      Lyndel Safe, [place] Patient Advocate Specialist [place] Patient Advocate Team Direct Number: [phone]  Fax: [phone]

## 2023-02-04 ENCOUNTER — Other Ambulatory Visit: Payer: Self-pay

## 2023-02-05 ENCOUNTER — Other Ambulatory Visit (HOSPITAL_COMMUNITY): Payer: Self-pay

## 2023-03-04 ENCOUNTER — Emergency Department (HOSPITAL_COMMUNITY)
Admission: EM | Admit: 2023-03-04 | Discharge: 2023-03-04 | Discharge status: Against Medical Advice | Payer: Medicaid Other | Attending: Emergency Medicine | Admitting: Emergency Medicine

## 2023-03-04 ENCOUNTER — Encounter (HOSPITAL_COMMUNITY): Payer: Self-pay

## 2023-03-04 ENCOUNTER — Emergency Department (HOSPITAL_COMMUNITY): Payer: Medicaid Other

## 2023-03-04 DIAGNOSIS — R1013 Epigastric pain: Secondary | ICD-10-CM | POA: Diagnosis not present

## 2023-03-04 DIAGNOSIS — R000 Tachycardia, unspecified: Secondary | ICD-10-CM | POA: Diagnosis not present

## 2023-03-04 DIAGNOSIS — R059 Cough, unspecified: Secondary | ICD-10-CM | POA: Insufficient documentation

## 2023-03-04 DIAGNOSIS — Z7982 Long term (current) use of aspirin: Secondary | ICD-10-CM | POA: Diagnosis not present

## 2023-03-04 DIAGNOSIS — J449 Chronic obstructive pulmonary disease, unspecified: Secondary | ICD-10-CM | POA: Diagnosis not present

## 2023-03-04 DIAGNOSIS — R0603 Acute respiratory distress: Secondary | ICD-10-CM | POA: Insufficient documentation

## 2023-03-04 DIAGNOSIS — Z7951 Long term (current) use of inhaled steroids: Secondary | ICD-10-CM | POA: Insufficient documentation

## 2023-03-04 LAB — CBC WITH DIFFERENTIAL/PLATELET
Abs Immature Granulocytes: 0.03 10*3/uL (ref 0.00–0.07)
Basophils Absolute: 0.1 10*3/uL (ref 0.0–0.1)
Basophils Relative: 1 %
Eosinophils Absolute: 0.5 10*3/uL (ref 0.0–0.5)
Eosinophils Relative: 6 %
HCT: 48.8 % — ABNORMAL HIGH (ref 36.0–46.0)
Hemoglobin: 15.8 g/dL — ABNORMAL HIGH (ref 12.0–15.0)
Immature Granulocytes: 0 %
Lymphocytes Relative: 39 %
Lymphs Abs: 3.3 10*3/uL (ref 0.7–4.0)
MCH: 28.1 pg (ref 26.0–34.0)
MCHC: 32.4 g/dL (ref 30.0–36.0)
MCV: 86.7 fL (ref 80.0–100.0)
Monocytes Absolute: 0.7 10*3/uL (ref 0.1–1.0)
Monocytes Relative: 8 %
Neutro Abs: 3.9 10*3/uL (ref 1.7–7.7)
Neutrophils Relative %: 46 %
Platelets: 342 10*3/uL (ref 150–400)
RBC: 5.63 MIL/uL — ABNORMAL HIGH (ref 3.87–5.11)
RDW: 13.2 % (ref 11.5–15.5)
WBC: 8.6 10*3/uL (ref 4.0–10.5)
nRBC: 0 % (ref 0.0–0.2)

## 2023-03-04 LAB — BLOOD GAS, VENOUS
Acid-Base Excess: 0 mmol/L (ref 0.0–2.0)
Bicarbonate: 25.4 mmol/L (ref 20.0–28.0)
O2 Saturation: 85.1 %
Patient temperature: 37
pCO2, Ven: 43 mmHg — ABNORMAL LOW (ref 44–60)
pH, Ven: 7.38 (ref 7.25–7.43)
pO2, Ven: 50 mmHg — ABNORMAL HIGH (ref 32–45)

## 2023-03-04 LAB — COMPREHENSIVE METABOLIC PANEL
ALT: 26 U/L (ref 0–44)
AST: 27 U/L (ref 15–41)
Albumin: 4 g/dL (ref 3.5–5.0)
Alkaline Phosphatase: 76 U/L (ref 38–126)
Anion gap: 11 (ref 5–15)
BUN: 12 mg/dL (ref 8–23)
CO2: 22 mmol/L (ref 22–32)
Calcium: 8.9 mg/dL (ref 8.9–10.3)
Chloride: 105 mmol/L (ref 98–111)
Creatinine, Ser: 0.95 mg/dL (ref 0.44–1.00)
GFR, Estimated: >60 mL/min (ref >60)
Glucose, Bld: 106 mg/dL — ABNORMAL HIGH (ref 70–99)
Potassium: 3.1 mmol/L — ABNORMAL LOW (ref 3.5–5.1)
Sodium: 138 mmol/L (ref 135–145)
Total Bilirubin: 0.4 mg/dL (ref 0.3–1.2)
Total Protein: 6.8 g/dL (ref 6.5–8.1)

## 2023-03-04 LAB — LIPASE, BLOOD: Lipase: 31 U/L (ref 11–51)

## 2023-03-04 MED ORDER — FAMOTIDINE IN NACL 20-0.9 MG/50ML-% IV SOLN
20.0000 mg | Freq: Once | INTRAVENOUS | Status: AC
  Administered 2023-03-04: 20 mg via INTRAVENOUS
  Filled 2023-03-04: qty 50

## 2023-03-04 MED ORDER — METHYLPREDNISOLONE SODIUM SUCC 125 MG IJ SOLR
125.0000 mg | Freq: Once | INTRAMUSCULAR | Status: AC
  Administered 2023-03-04: 125 mg via INTRAVENOUS
  Filled 2023-03-04: qty 2

## 2023-03-04 MED ORDER — IPRATROPIUM-ALBUTEROL 0.5-2.5 (3) MG/3ML IN SOLN: 3.0000 mL | Freq: Once | RESPIRATORY_TRACT | Status: DC

## 2023-03-04 MED ORDER — ALBUTEROL SULFATE (2.5 MG/3ML) 0.083% IN NEBU
10.0000 mg/h | INHALATION_SOLUTION | RESPIRATORY_TRACT | Status: DC
  Administered 2023-03-04: 10 mg/h via RESPIRATORY_TRACT
  Filled 2023-03-04: qty 12

## 2023-03-04 MED ORDER — IPRATROPIUM BROMIDE 0.02 % IN SOLN
0.5000 mg | Freq: Once | RESPIRATORY_TRACT | Status: AC
  Administered 2023-03-04: 0.5 mg via RESPIRATORY_TRACT
  Filled 2023-03-04: qty 2.5

## 2023-03-04 MED ORDER — MAGNESIUM SULFATE 2 GM/50ML IV SOLN
2.0000 g | Freq: Once | INTRAVENOUS | Status: AC
  Administered 2023-03-04: 2 g via INTRAVENOUS
  Filled 2023-03-04: qty 50

## 2023-03-04 NOTE — Progress Notes (Signed)
ED staff able to successfully place PIV

## 2023-03-04 NOTE — ED Notes (Signed)
Pt pulled out her IV and was walking out of department. States "I'm better" encouraged to stay for more treatment but elected to leave.  Ambulated from department in no obvious distress.  EDP notified.

## 2023-03-04 NOTE — ED Provider Notes (Signed)
[facility] Provider Note   CSN: [id number] Arrival date & time: 03/04/23  0011     History  Chief Complaint  Patient presents with   Respiratory Distress    Kathleen [name] is a 62 y.o. female.  The history is provided by the patient and medical records.  Kathleen [name] is a 62 y.o. female who presents to the Emergency Department complaining of sob.  Shortness of breath started this morning when she walked outside into the pollen.  She tried 4-5 breathing treatments at home with no significant improvement in her symptoms.  No associated fever.  No chest pain.  She does have a cough productive of white sputum.  She also complains of epigastric pain secondary to coughing.  No history of DVT/PE.  She does not use oxygen at home but was told when she was recently hospitalized that she will need to follow-up with her PCP for possible initiation of home oxygen.      Home Medications Prior to Admission medications   Medication Sig Start Date End Date Taking? Authorizing Provider  albuterol (PROVENTIL) (2.5 MG/3ML) 0.083% nebulizer solution Take 3 mLs (2.5 mg total) by nebulization every 4 (four) hours as needed for wheezing or shortness of breath. 11/17/22   Kathie Dike, MD  albuterol (VENTOLIN HFA) 108 (90 Base) MCG/ACT inhaler Inhale 2 puffs into the lungs every 6 (six) hours as needed for wheezing or shortness of breath. 02/01/23   Nita Sells, MD  aspirin EC 81 MG tablet Take 81 mg by mouth daily. Swallow whole.    [provider]  azithromycin (ZITHROMAX) 250 MG tablet Take 1 tablet (250 mg total) by mouth daily until gone 02/02/23   Nita Sells, MD  EPINEPHrine (EPIPEN 2-PAK) 0.3 mg/0.3 mL IJ SOAJ injection Inject 0.3 mg into the muscle as needed for anaphylaxis. 02/01/23   Nita Sells, MD  Fluticasone-Umeclidin-Vilant (TRELEGY ELLIPTA) 200-62.5-25 MCG/ACT AEPB Inhale 2 puffs into the lungs daily. 02/01/23    Nita Sells, MD  guaiFENesin-dextromethorphan (ROBITUSSIN DM) 100-10 MG/5ML syrup Take 10 mLs by mouth every 4 (four) hours as needed for cough. 02/01/23   Nita Sells, MD  hydrOXYzine (ATARAX) 25 MG tablet Take 1 tablet (25 mg total) by mouth 3 (three) times daily as needed for anxiety. 11/17/22   Kathie Dike, MD  ipratropium-albuterol (DUONEB) 0.5-2.5 (3) MG/3ML SOLN Take 3 mLs by nebulization every 2 (two) hours as needed. 01/26/23   Charlesetta Shanks, MD  lidocaine (LIDODERM) 5 % Place 1 patch onto the skin daily. Remove & Discard patch within 12 hours or as directed by MD 02/01/23   Nita Sells, MD  loratadine (CLARITIN) 10 MG tablet Take 1 tablet (10 mg total) by mouth daily. 02/01/23   Nita Sells, MD  methocarbamol (ROBAXIN) 500 MG tablet Take 1 tablet (500 mg total) by mouth every 6 (six) hours as needed for muscle spasms. 02/01/23   Nita Sells, MD  montelukast (SINGULAIR) 10 MG tablet Take 1 tablet (10 mg total) by mouth at bedtime. 02/01/23   Nita Sells, MD  Nicotine 21-14-7 MG/24HR KIT Take as prescribed 02/01/23   Nita Sells, MD  ondansetron (ZOFRAN) 4 MG tablet TAKE 1 TABLET BY MOUTH ONCE DAILY AS NEEDED FOR NAUSEA FOR VOMITING Patient taking differently: Take 4 mg by mouth daily as needed for nausea or vomiting. 02/18/21   [name], MD  pantoprazole (PROTONIX) 40 MG tablet Take 1 tablet (40 mg total) by mouth 2 (two) times  daily before a meal. 02/01/23   Nita Sells, MD      Allergies    Bee venom, Peach [prunus persica], Topamax [topiramate], and Tramadol    Review of Systems   Review of Systems  All other systems reviewed and are negative.   Physical Exam Updated Vital Signs BP (!) 134/90   Pulse (!) 114   Temp 98.1 F (36.7 C)   Resp (!) 33   LMP 11/08/2014   SpO2 96%  Physical Exam Vitals and nursing note reviewed.  Constitutional:      Appearance: She is well-developed.  HENT:     Head:  Normocephalic and atraumatic.  Cardiovascular:     Rate and Rhythm: Regular rhythm. Tachycardia present.     Heart sounds: No murmur heard. Pulmonary:     Comments: Tachypnea, accessory muscle use, diffuse wheezes in all lung fields.  Abdominal:     Palpations: Abdomen is soft.     Tenderness: There is no guarding or rebound.     Comments: Moderate LUQ tenderness  Musculoskeletal:        General: No tenderness.  Skin:    General: Skin is warm and dry.  Neurological:     Mental Status: She is alert and oriented to person, place, and time.  Psychiatric:        Behavior: Behavior normal.     ED Results / Procedures / Treatments   Labs (all labs ordered are listed, but only abnormal results are displayed) Labs Reviewed  CBC WITH DIFFERENTIAL/PLATELET - Abnormal; Notable for the following components:      Result Value   RBC 5.63 (*)    Hemoglobin 15.8 (*)    HCT 48.8 (*)    All other components within normal limits  BLOOD GAS, VENOUS - Abnormal; Notable for the following components:   pCO2, Ven 43 (*)    pO2, Ven 50 (*)    All other components within normal limits  COMPREHENSIVE METABOLIC PANEL - Abnormal; Notable for the following components:   Potassium 3.1 (*)    Glucose, Bld 106 (*)    All other components within normal limits  LIPASE, BLOOD    EKG EKG Interpretation  Date/Time:  Friday March 04 2023 00:21:02 EDT Ventricular Rate:  113 PR Interval:  138 QRS Duration: 80 QT Interval:  348 QTC Calculation: 478 R Axis:   -68 Text Interpretation: Sinus tachycardia Left anterior fascicular block Abnormal R-wave progression, early transition Confirmed by Quintella Reichert [phone]) on 03/04/2023 12:24:05 AM  Radiology DG Chest Port 1 View  Result Date: 03/04/2023 CLINICAL DATA:  Shortness of breath. EXAM: PORTABLE CHEST 1 VIEW COMPARISON:  Chest radiograph dated 01/30/2023. FINDINGS: No focal consolidation, pleural effusion, or pneumothorax. The cardiac silhouette is  within normal limits. No acute osseous pathology. IMPRESSION: No active disease. Electronically Signed   By: Anner Crete M.D.   On: 03/04/2023 00:52    Procedures Procedures   CRITICAL CARE Performed by: Quintella Reichert   Total critical care time: 40 minutes  Critical care time was exclusive of separately billable procedures and treating other patients.  Critical care was necessary to treat or prevent imminent or life-threatening deterioration.  Critical care was time spent personally by me on the following activities: development of treatment plan with patient and/or surrogate as well as nursing, discussions with consultants, evaluation of patient's response to treatment, examination of patient, obtaining history from patient or surrogate, ordering and performing treatments and interventions, ordering and review of laboratory studies, ordering  and review of radiographic studies, pulse oximetry and re-evaluation of patient's condition.  Medications Ordered in ED Medications  albuterol (PROVENTIL) (2.5 MG/3ML) 0.083% nebulizer solution (0 mg/hr Nebulization Stopped 03/04/23 0145)  ipratropium-albuterol (DUONEB) 0.5-2.5 (3) MG/3ML nebulizer solution 3 mL (has no administration in time range)  ipratropium (ATROVENT) nebulizer solution 0.5 mg (0.5 mg Nebulization Given 03/04/23 0042)  methylPREDNISolone sodium succinate (SOLU-MEDROL) 125 mg/2 mL injection 125 mg (125 mg Intravenous Given 03/04/23 0109)  magnesium sulfate IVPB 2 g 50 mL (0 g Intravenous Stopped 03/04/23 0212)  famotidine (PEPCID) IVPB 20 mg premix (0 mg Intravenous Stopped 03/04/23 0140)    ED Course/ Medical Decision Making/ A&P                             Medical Decision Making Amount and/or Complexity of Data Reviewed Labs: ordered. Radiology: ordered.  Risk Prescription drug management.   Patient with history of COPD/asthma here for evaluation of shortness of breath.  She has increased work of breathing on  evaluation with tachypnea, accessory muscle use and wheezes.  She was treated with continuous albuterol, Solu-Medrol as well as magnesium.  She had partial improvement in her symptoms but persistent tachypnea and wheezing on repeat evaluation.  Plan to retreat with additional DuoNeb.  Patient eloped from department prior to additional conversations or further evaluations.        Final Clinical Impression(s) / ED Diagnoses Final diagnoses:  Respiratory distress    Rx / DC Orders ED Discharge Orders     None         Quintella Reichert, MD 03/04/23 [phone]

## 2023-03-04 NOTE — ED Triage Notes (Signed)
Pt states that she woke up SOB, hx of COPD, audible wheezing, speaking in short sentences, pt was 88% on RA, placed on 2L, coughing up white phlegm

## 2023-03-08 ENCOUNTER — Encounter (HOSPITAL_COMMUNITY): Payer: Self-pay | Admitting: Emergency Medicine

## 2023-03-08 ENCOUNTER — Emergency Department (HOSPITAL_COMMUNITY): Payer: Medicaid Other

## 2023-03-08 ENCOUNTER — Inpatient Hospital Stay (HOSPITAL_COMMUNITY)
Admission: EM | Admit: 2023-03-08 | Discharge: 2023-03-10 | DRG: 189 | Disposition: A | Discharge status: Home / Self Care | Payer: Medicaid Other | Attending: Internal Medicine | Admitting: Internal Medicine

## 2023-03-08 ENCOUNTER — Other Ambulatory Visit: Payer: Self-pay

## 2023-03-08 DIAGNOSIS — Z888 Allergy status to other drugs, medicaments and biological substances status: Secondary | ICD-10-CM

## 2023-03-08 DIAGNOSIS — J4489 Other specified chronic obstructive pulmonary disease: Secondary | ICD-10-CM

## 2023-03-08 DIAGNOSIS — Z7951 Long term (current) use of inhaled steroids: Secondary | ICD-10-CM

## 2023-03-08 DIAGNOSIS — G4733 Obstructive sleep apnea (adult) (pediatric): Secondary | ICD-10-CM | POA: Diagnosis present

## 2023-03-08 DIAGNOSIS — F17210 Nicotine dependence, cigarettes, uncomplicated: Secondary | ICD-10-CM | POA: Diagnosis present

## 2023-03-08 DIAGNOSIS — Z1152 Encounter for screening for COVID-19: Secondary | ICD-10-CM | POA: Diagnosis not present

## 2023-03-08 DIAGNOSIS — Z8249 Family history of ischemic heart disease and other diseases of the circulatory system: Secondary | ICD-10-CM

## 2023-03-08 DIAGNOSIS — Z7982 Long term (current) use of aspirin: Secondary | ICD-10-CM

## 2023-03-08 DIAGNOSIS — Z79899 Other long term (current) drug therapy: Secondary | ICD-10-CM | POA: Diagnosis not present

## 2023-03-08 DIAGNOSIS — Z800 Family history of malignant neoplasm of digestive organs: Secondary | ICD-10-CM | POA: Diagnosis not present

## 2023-03-08 DIAGNOSIS — J9601 Acute respiratory failure with hypoxia: Secondary | ICD-10-CM

## 2023-03-08 DIAGNOSIS — Z833 Family history of diabetes mellitus: Secondary | ICD-10-CM

## 2023-03-08 DIAGNOSIS — K219 Gastro-esophageal reflux disease without esophagitis: Secondary | ICD-10-CM | POA: Diagnosis present

## 2023-03-08 DIAGNOSIS — G8929 Other chronic pain: Secondary | ICD-10-CM | POA: Diagnosis present

## 2023-03-08 DIAGNOSIS — J9602 Acute respiratory failure with hypercapnia: Secondary | ICD-10-CM

## 2023-03-08 DIAGNOSIS — Z8673 Personal history of transient ischemic attack (TIA), and cerebral infarction without residual deficits: Secondary | ICD-10-CM | POA: Diagnosis not present

## 2023-03-08 DIAGNOSIS — Z803 Family history of malignant neoplasm of breast: Secondary | ICD-10-CM

## 2023-03-08 DIAGNOSIS — J441 Chronic obstructive pulmonary disease with (acute) exacerbation: Secondary | ICD-10-CM | POA: Diagnosis present

## 2023-03-08 DIAGNOSIS — Z91030 Bee allergy status: Secondary | ICD-10-CM

## 2023-03-08 DIAGNOSIS — E785 Hyperlipidemia, unspecified: Secondary | ICD-10-CM | POA: Diagnosis present

## 2023-03-08 DIAGNOSIS — R0603 Acute respiratory distress: Secondary | ICD-10-CM | POA: Diagnosis present

## 2023-03-08 DIAGNOSIS — J439 Emphysema, unspecified: Secondary | ICD-10-CM | POA: Diagnosis present

## 2023-03-08 DIAGNOSIS — J45901 Unspecified asthma with (acute) exacerbation: Secondary | ICD-10-CM | POA: Diagnosis present

## 2023-03-08 DIAGNOSIS — F319 Bipolar disorder, unspecified: Secondary | ICD-10-CM | POA: Diagnosis present

## 2023-03-08 DIAGNOSIS — Z720 Tobacco use: Secondary | ICD-10-CM | POA: Diagnosis present

## 2023-03-08 DIAGNOSIS — Z885 Allergy status to narcotic agent status: Secondary | ICD-10-CM

## 2023-03-08 HISTORY — DX: Acute respiratory failure with hypoxia: J96.01

## 2023-03-08 LAB — ETHANOL: Alcohol, Ethyl (B): <10 mg/dL (ref <10)

## 2023-03-08 LAB — RESP PANEL BY RT-PCR (RSV, FLU A&B, COVID)  RVPGX2
Influenza A by PCR: NEGATIVE
Influenza B by PCR: NEGATIVE
Resp Syncytial Virus by PCR: NEGATIVE
SARS Coronavirus 2 by RT PCR: NEGATIVE

## 2023-03-08 LAB — BASIC METABOLIC PANEL
Anion gap: 9 (ref 5–15)
BUN: 11 mg/dL (ref 8–23)
CO2: 23 mmol/L (ref 22–32)
Calcium: 9.1 mg/dL (ref 8.9–10.3)
Chloride: 105 mmol/L (ref 98–111)
Creatinine, Ser: 0.79 mg/dL (ref 0.44–1.00)
GFR, Estimated: >60 mL/min (ref >60)
Glucose, Bld: 123 mg/dL — ABNORMAL HIGH (ref 70–99)
Potassium: 4.7 mmol/L (ref 3.5–5.1)
Sodium: 137 mmol/L (ref 135–145)

## 2023-03-08 LAB — CBC WITH DIFFERENTIAL/PLATELET
Abs Immature Granulocytes: 0.05 10*3/uL (ref 0.00–0.07)
Basophils Absolute: 0.1 10*3/uL (ref 0.0–0.1)
Basophils Relative: 1 %
Eosinophils Absolute: 1 10*3/uL — ABNORMAL HIGH (ref 0.0–0.5)
Eosinophils Relative: 7 %
HCT: 47.3 % — ABNORMAL HIGH (ref 36.0–46.0)
Hemoglobin: 15.2 g/dL — ABNORMAL HIGH (ref 12.0–15.0)
Immature Granulocytes: 0 %
Lymphocytes Relative: 37 %
Lymphs Abs: 5.1 10*3/uL — ABNORMAL HIGH (ref 0.7–4.0)
MCH: 28 pg (ref 26.0–34.0)
MCHC: 32.1 g/dL (ref 30.0–36.0)
MCV: 87.1 fL (ref 80.0–100.0)
Monocytes Absolute: 1.4 10*3/uL — ABNORMAL HIGH (ref 0.1–1.0)
Monocytes Relative: 10 %
Neutro Abs: 6.1 10*3/uL (ref 1.7–7.7)
Neutrophils Relative %: 45 %
Platelets: 333 10*3/uL (ref 150–400)
RBC: 5.43 MIL/uL — ABNORMAL HIGH (ref 3.87–5.11)
RDW: 13.2 % (ref 11.5–15.5)
WBC: 13.7 10*3/uL — ABNORMAL HIGH (ref 4.0–10.5)
nRBC: 0 % (ref 0.0–0.2)

## 2023-03-08 LAB — TROPONIN I (HIGH SENSITIVITY): Troponin I (High Sensitivity): 4 ng/L (ref <18)

## 2023-03-08 LAB — BLOOD GAS, VENOUS
Acid-base deficit: 0.2 mmol/L (ref 0.0–2.0)
Bicarbonate: 28.5 mmol/L — ABNORMAL HIGH (ref 20.0–28.0)
O2 Saturation: 93.6 %
Patient temperature: 37
pCO2, Ven: 62 mmHg — ABNORMAL HIGH (ref 44–60)
pH, Ven: 7.27 (ref 7.25–7.43)
pO2, Ven: 66 mmHg — ABNORMAL HIGH (ref 32–45)

## 2023-03-08 MED ORDER — ALBUTEROL SULFATE (2.5 MG/3ML) 0.083% IN NEBU: 2.5000 mg | INHALATION_SOLUTION | RESPIRATORY_TRACT | Status: DC | PRN

## 2023-03-08 MED ORDER — GUAIFENESIN-DM 100-10 MG/5ML PO SYRP: 10.0000 mL | ORAL_SOLUTION | ORAL | Status: DC | PRN

## 2023-03-08 MED ORDER — ONDANSETRON HCL 4 MG PO TABS
4.0000 mg | ORAL_TABLET | Freq: Four times a day (QID) | ORAL | Status: DC | PRN
  Administered 2023-03-08 – 2023-03-09 (×2): 4 mg via ORAL
  Filled 2023-03-08 (×2): qty 1

## 2023-03-08 MED ORDER — PROCHLORPERAZINE EDISYLATE 10 MG/2ML IJ SOLN
10.0000 mg | Freq: Four times a day (QID) | INTRAMUSCULAR | Status: DC | PRN
  Administered 2023-03-08: 10 mg via INTRAVENOUS
  Filled 2023-03-08: qty 2

## 2023-03-08 MED ORDER — MONTELUKAST SODIUM 10 MG PO TABS
10.0000 mg | ORAL_TABLET | Freq: Every day | ORAL | Status: DC
  Administered 2023-03-08 – 2023-03-09 (×2): 10 mg via ORAL
  Filled 2023-03-08 (×2): qty 1

## 2023-03-08 MED ORDER — FAMOTIDINE 20 MG PO TABS
20.0000 mg | ORAL_TABLET | Freq: Once | ORAL | Status: AC
  Administered 2023-03-08: 20 mg via ORAL
  Filled 2023-03-08: qty 1

## 2023-03-08 MED ORDER — PANTOPRAZOLE SODIUM 40 MG PO TBEC
40.0000 mg | DELAYED_RELEASE_TABLET | Freq: Every day | ORAL | Status: DC
  Administered 2023-03-08 – 2023-03-09 (×2): 40 mg via ORAL
  Filled 2023-03-08 (×2): qty 1

## 2023-03-08 MED ORDER — SODIUM CHLORIDE 0.9 % IV SOLN: INTRAVENOUS | Status: DC

## 2023-03-08 MED ORDER — KETOROLAC TROMETHAMINE 15 MG/ML IJ SOLN
15.0000 mg | Freq: Once | INTRAMUSCULAR | Status: AC
  Administered 2023-03-08: 15 mg via INTRAVENOUS
  Filled 2023-03-08: qty 1

## 2023-03-08 MED ORDER — METHOCARBAMOL 500 MG PO TABS
500.0000 mg | ORAL_TABLET | Freq: Four times a day (QID) | ORAL | Status: DC | PRN
  Administered 2023-03-08 – 2023-03-09 (×3): 500 mg via ORAL
  Filled 2023-03-08 (×3): qty 1

## 2023-03-08 MED ORDER — ENOXAPARIN SODIUM 40 MG/0.4ML IJ SOSY
40.0000 mg | PREFILLED_SYRINGE | INTRAMUSCULAR | Status: DC
  Administered 2023-03-08 – 2023-03-10 (×3): 40 mg via SUBCUTANEOUS
  Filled 2023-03-08 (×3): qty 0.4

## 2023-03-08 MED ORDER — ALUM & MAG HYDROXIDE-SIMETH 200-200-20 MG/5ML PO SUSP
30.0000 mL | ORAL | Status: DC | PRN
  Administered 2023-03-08: 30 mL via ORAL
  Filled 2023-03-08: qty 30

## 2023-03-08 MED ORDER — IPRATROPIUM-ALBUTEROL 0.5-2.5 (3) MG/3ML IN SOLN
3.0000 mL | Freq: Four times a day (QID) | RESPIRATORY_TRACT | Status: DC
  Administered 2023-03-08 (×4): 3 mL via RESPIRATORY_TRACT
  Filled 2023-03-08 (×4): qty 3

## 2023-03-08 MED ORDER — IPRATROPIUM-ALBUTEROL 0.5-2.5 (3) MG/3ML IN SOLN
3.0000 mL | Freq: Three times a day (TID) | RESPIRATORY_TRACT | Status: DC
  Administered 2023-03-09 – 2023-03-10 (×4): 3 mL via RESPIRATORY_TRACT
  Filled 2023-03-08 (×4): qty 3

## 2023-03-08 MED ORDER — ASPIRIN 81 MG PO TBEC
81.0000 mg | DELAYED_RELEASE_TABLET | Freq: Every day | ORAL | Status: DC
  Administered 2023-03-08 – 2023-03-09 (×2): 81 mg via ORAL
  Filled 2023-03-08 (×2): qty 1

## 2023-03-08 MED ORDER — ACETAMINOPHEN 325 MG PO TABS
650.0000 mg | ORAL_TABLET | Freq: Four times a day (QID) | ORAL | Status: DC | PRN
  Administered 2023-03-09: 650 mg via ORAL
  Filled 2023-03-08: qty 2

## 2023-03-08 MED ORDER — DIAZEPAM 5 MG/ML IJ SOLN
5.0000 mg | Freq: Once | INTRAMUSCULAR | Status: AC
  Administered 2023-03-08: 5 mg via INTRAVENOUS
  Filled 2023-03-08: qty 2

## 2023-03-08 MED ORDER — LIDOCAINE 5 % EX PTCH
1.0000 | MEDICATED_PATCH | Freq: Every day | CUTANEOUS | Status: DC
  Administered 2023-03-08 – 2023-03-09 (×2): 1 via TRANSDERMAL
  Filled 2023-03-08 (×3): qty 1

## 2023-03-08 MED ORDER — FENTANYL CITRATE PF 50 MCG/ML IJ SOSY
25.0000 ug | PREFILLED_SYRINGE | Freq: Once | INTRAMUSCULAR | Status: AC
  Administered 2023-03-08: 25 ug via INTRAVENOUS
  Filled 2023-03-08: qty 1

## 2023-03-08 MED ORDER — OXYCODONE HCL 5 MG PO TABS
5.0000 mg | ORAL_TABLET | Freq: Four times a day (QID) | ORAL | Status: DC | PRN
  Administered 2023-03-08 – 2023-03-10 (×5): 5 mg via ORAL
  Filled 2023-03-08 (×5): qty 1

## 2023-03-08 MED ORDER — ACETAMINOPHEN 650 MG RE SUPP: 650.0000 mg | Freq: Four times a day (QID) | RECTAL | Status: DC | PRN

## 2023-03-08 MED ORDER — ONDANSETRON HCL 4 MG/2ML IJ SOLN
4.0000 mg | Freq: Four times a day (QID) | INTRAMUSCULAR | Status: DC | PRN
  Administered 2023-03-08: 4 mg via INTRAVENOUS
  Filled 2023-03-08: qty 2

## 2023-03-08 MED ORDER — PREDNISONE 20 MG PO TABS
40.0000 mg | ORAL_TABLET | Freq: Every day | ORAL | Status: DC
  Administered 2023-03-08 – 2023-03-10 (×3): 40 mg via ORAL
  Filled 2023-03-08 (×3): qty 2

## 2023-03-08 NOTE — Plan of Care (Signed)
  Problem: Activity: Goal: Ability to tolerate increased activity will improve Outcome: Progressing Goal: Will verbalize the importance of balancing activity with adequate rest periods Outcome: Progressing   Problem: Respiratory: Goal: Ability to maintain a clear airway will improve Outcome: Progressing Goal: Levels of oxygenation will improve Outcome: Progressing

## 2023-03-08 NOTE — ED Provider Notes (Addendum)
[facility] DEPT Provider Note: Georgena Spurling, MD, FACEP  CSN: [id number] MRN: [id number] ARRIVAL: 03/08/23 at [place]: RESA/RESA   CHIEF COMPLAINT  Respiratory Distress  Level 5 caveat: Respiratory distress HISTORY OF PRESENT ILLNESS  03/08/23 3:33 AM Kathleen [name] is a 62 y.o. female with a history of COPD.  She was seen on 03/04/2023 for respiratory distress and was partially treated but refused BiPAP because she did not wish to be admitted to the hospital.  She pulled out her IV and eloped.  She returns after EMS was called for respiratory distress.  They state that she was in severe distress on scene and was complaining of increased cough and shortness of breath for 24 hours.  She had not gotten adequate relief with her home inhaler or with 20 mg of albuterol by nebulizer.  They noticed she was tachypneic and diminished in all fields.  Oxygen saturation was 87% on room air.  She was afebrile.  Prior to arrival she was given a total of 10 mg of albuterol and 1 mg of Atrovent by nebulization, 0.3 mg of epinephrine IM, 125 mg of Solu-Medrol IV and 2 g of magnesium sulfate IV.  On arrival she continues to be in distress and is unable to give any history.   Past Medical History:  Diagnosis Date   Anxiety    Arthritis    hands   Asthma    2 sets of PFT's in 04/09 without sign variability. Last set with significant decrease in FEV1 with saline alone, suggesting Asthma but  recommended clinical corelation    Asthma    Bipolar 1 disorder [facility])    therapist is Joy and is followed by [facility]   Blackout    negative work up including ESR, ANA, opthalmology referral, carotid dopplers, 2D echo, MRI and EEG.   BREAST LUMP 03/25/2008   Annotation: bilaterally Qualifier: Diagnosis of  By: Tomasa Hosteller MD, Edmon Crape.    Breast mass in female    s/p mammogram, u/s and biopsy in 05/09 c/w fibroadenoma,   Bronchitis    Chronic headache    Chronic low back pain 08/08/2008    Qualifier: Diagnosis of  By: Redmond Pulling  MD, Valerie     Chronic pain    normal work up including TSH, RPR, B12, HIV, plain films, 2 ESR's, ANA, CK, RF along with routine CBC, CMET and UA. Further work up includes CRP, ESR, SPEP/UPEP, hepatitis erology, A1C , repeat ANA   COPD (chronic obstructive pulmonary disease) ([place])    COPD exacerbation ([place]) 03/31/2019   Depression    DUB (dysfunctional uterine bleeding)    and pelvic pain, with negative endometrial bx in 07/09 and transvaginal U/S significant for mild fibroids in 0/09.   Emphysema of lung ([place])    GERD (gastroesophageal reflux disease)    Hallucinations 03/18/2008   Qualifier: Diagnosis of  By: Redmond Pulling  MD, Mateo Flow     Hyperlipidemia    Lower extremity edema    Neg ABI's, normal 2D echo, normal albumin   Menorrhagia    Ovarian cyst    Polysubstance abuse ([place])    none since March 17,2009.   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   Tubular adenoma of colon     Past Surgical History:  Procedure Laterality Date   CESAREAN SECTION     CESAREAN SECTION     COLONOSCOPY  HERNIA REPAIR     Left partial oophorectomy     OOPHORECTOMY     1/2 ovary removed   POLYPECTOMY     VAGINA SURGERY     mesh    Family History  Problem Relation Age of Onset   Colon cancer Mother 9   Breast cancer Mother 78   Rectal cancer Mother    Diabetes Father    Hypertension Father    Kidney disease Father    Colon cancer Father 66   Prostate cancer Father    Cancer Father    Kidney disease Sister    Cancer Brother    Breast cancer Maternal Grandmother 67   Esophageal cancer Neg Hx    Stomach cancer Neg Hx     Social History   Tobacco Use   Smoking status: Some Days    Packs/day: 0.25    Years: 30.00    Additional pack years: 0.00    Total pack years: 7.50    Types: Cigarettes    Last attempt to quit: 12/28/2016    Years since quitting: 6.1   Smokeless tobacco: Never   Vaping Use   Vaping Use: Never used  Substance Use Topics   Alcohol use: Yes   Drug use: Yes    Types: Marijuana    Comment: Sometimes.    Prior to Admission medications   Medication Sig Start Date End Date Taking? Authorizing Provider  albuterol (PROVENTIL) (2.5 MG/3ML) 0.083% nebulizer solution Take 3 mLs (2.5 mg total) by nebulization every 4 (four) hours as needed for wheezing or shortness of breath. 11/17/22   Kathie Dike, MD  albuterol (VENTOLIN HFA) 108 (90 Base) MCG/ACT inhaler Inhale 2 puffs into the lungs every 6 (six) hours as needed for wheezing or shortness of breath. 02/01/23   Nita Sells, MD  aspirin EC 81 MG tablet Take 81 mg by mouth daily. Swallow whole.    [provider]  azithromycin (ZITHROMAX) 250 MG tablet Take 1 tablet (250 mg total) by mouth daily until gone 02/02/23   Nita Sells, MD  EPINEPHrine (EPIPEN 2-PAK) 0.3 mg/0.3 mL IJ SOAJ injection Inject 0.3 mg into the muscle as needed for anaphylaxis. 02/01/23   Nita Sells, MD  Fluticasone-Umeclidin-Vilant (TRELEGY ELLIPTA) 200-62.5-25 MCG/ACT AEPB Inhale 2 puffs into the lungs daily. 02/01/23   Nita Sells, MD  guaiFENesin-dextromethorphan (ROBITUSSIN DM) 100-10 MG/5ML syrup Take 10 mLs by mouth every 4 (four) hours as needed for cough. 02/01/23   Nita Sells, MD  hydrOXYzine (ATARAX) 25 MG tablet Take 1 tablet (25 mg total) by mouth 3 (three) times daily as needed for anxiety. 11/17/22   Kathie Dike, MD  ipratropium-albuterol (DUONEB) 0.5-2.5 (3) MG/3ML SOLN Take 3 mLs by nebulization every 2 (two) hours as needed. 01/26/23   Charlesetta Shanks, MD  lidocaine (LIDODERM) 5 % Place 1 patch onto the skin daily. Remove & Discard patch within 12 hours or as directed by MD 02/01/23   Nita Sells, MD  loratadine (CLARITIN) 10 MG tablet Take 1 tablet (10 mg total) by mouth daily. 02/01/23   Nita Sells, MD  methocarbamol (ROBAXIN) 500 MG tablet Take 1  tablet (500 mg total) by mouth every 6 (six) hours as needed for muscle spasms. 02/01/23   Nita Sells, MD  montelukast (SINGULAIR) 10 MG tablet Take 1 tablet (10 mg total) by mouth at bedtime. 02/01/23   Nita Sells, MD  Nicotine 21-14-7 MG/24HR KIT Take as prescribed 02/01/23   Nita Sells, MD  ondansetron [facility]) 4  MG tablet TAKE 1 TABLET BY MOUTH ONCE DAILY AS NEEDED FOR NAUSEA FOR VOMITING Patient taking differently: Take 4 mg by mouth daily as needed for nausea or vomiting. 02/18/21   [name], MD  pantoprazole (PROTONIX) 40 MG tablet Take 1 tablet (40 mg total) by mouth 2 (two) times daily before a meal. 02/01/23   Nita Sells, MD    Allergies Bee venom, Peach [prunus persica], Topamax [topiramate], and Tramadol   REVIEW OF SYSTEMS  Level 5 caveat   PHYSICAL EXAMINATION  Initial Vital Signs Blood pressure (!) 137/103, pulse (!) 131, resp. rate (!) 26, last menstrual period 11/08/2014, SpO2 100 %.  Examination General: Well-developed, thin female in obvious distress; appearance consistent with age of record HENT: normocephalic; atraumatic Eyes: Normal appearance Neck: supple Heart: regular rate and rhythm; tachycardia Lungs: Tachypnea; decreased air movement bilaterally Abdomen: soft; nondistended; nontender; bowel sounds present Extremities: No deformity; full range of motion; pulses normal Neurologic: Awake, alert; motor function intact in all extremities and symmetric; no facial droop Skin: Warm and dry Psychiatric: Agitated   RESULTS  Summary of this visit's results, reviewed and interpreted by myself:   EKG Interpretation  Date/Time:  Tuesday March 08 2023 03:42:04 EDT Ventricular Rate:  129 PR Interval:  110 QRS Duration: 226 QT Interval:  414 QTC Calculation: 607 R Axis:   -80 Text Interpretation: Sinus tachycardia Consider right atrial enlargement Left ventricular hypertrophy Artifact No significant change was found  Confirmed by Shanon Rosser [phone]) on 03/08/2023 3:46:59 AM       Laboratory Studies: Results for orders placed or performed during the hospital encounter of 03/08/23 (from the past 24 hour(s))  CBC with Differential     Status: Abnormal   Collection Time: 03/08/23  3:33 AM  Result Value Ref Range   WBC 13.7 (H) 4.0 - 10.5 K/uL   RBC 5.43 (H) 3.87 - 5.11 MIL/uL   Hemoglobin 15.2 (H) 12.0 - 15.0 g/dL   HCT 47.3 (H) 36.0 - 46.0 %   MCV 87.1 80.0 - 100.0 fL   MCH 28.0 26.0 - 34.0 pg   MCHC 32.1 30.0 - 36.0 g/dL   RDW 13.2 11.5 - 15.5 %   Platelets 333 150 - 400 K/uL   nRBC 0.0 0.0 - 0.2 %   Neutrophils Relative % 45 %   Neutro Abs 6.1 1.7 - 7.7 K/uL   Lymphocytes Relative 37 %   Lymphs Abs 5.1 (H) 0.7 - 4.0 K/uL   Monocytes Relative 10 %   Monocytes Absolute 1.4 (H) 0.1 - 1.0 K/uL   Eosinophils Relative 7 %   Eosinophils Absolute 1.0 (H) 0.0 - 0.5 K/uL   Basophils Relative 1 %   Basophils Absolute 0.1 0.0 - 0.1 K/uL   Immature Granulocytes 0 %   Abs Immature Granulocytes 0.05 0.00 - 0.07 K/uL  Basic metabolic panel     Status: Abnormal   Collection Time: 03/08/23  3:33 AM  Result Value Ref Range   Sodium 137 135 - 145 mmol/L   Potassium 4.7 3.5 - 5.1 mmol/L   Chloride 105 98 - 111 mmol/L   CO2 23 22 - 32 mmol/L   Glucose, Bld 123 (H) 70 - 99 mg/dL   BUN 11 8 - 23 mg/dL   Creatinine, Ser 0.79 0.44 - 1.00 mg/dL   Calcium 9.1 8.9 - 10.3 mg/dL   GFR, Estimated >60 >60 mL/min   Anion gap 9 5 - 15  Troponin I (High Sensitivity)  Status: None   Collection Time: 03/08/23  3:33 AM  Result Value Ref Range   Troponin I (High Sensitivity) 4 <18 ng/L  Blood gas, venous (at [facility] and [facility])     Status: Abnormal   Collection Time: 03/08/23  3:33 AM  Result Value Ref Range   pH, Ven 7.27 7.25 - 7.43   pCO2, Ven 62 (H) 44 - 60 mmHg   pO2, Ven 66 (H) 32 - 45 mmHg   Bicarbonate 28.5 (H) 20.0 - 28.0 mmol/L   Acid-base deficit 0.2 0.0 - 2.0 mmol/L   O2 Saturation 93.6 %   Patient  temperature 37.0   Ethanol     Status: None   Collection Time: 03/08/23  3:41 AM  Result Value Ref Range   Alcohol, Ethyl (B) <10 <10 mg/dL  Resp panel by RT-PCR (RSV, Flu A&B, Covid) Anterior Nasal Swab     Status: None   Collection Time: 03/08/23  3:48 AM   Specimen: Anterior Nasal Swab  Result Value Ref Range   SARS Coronavirus 2 by RT PCR NEGATIVE NEGATIVE   Influenza A by PCR NEGATIVE NEGATIVE   Influenza B by PCR NEGATIVE NEGATIVE   Resp Syncytial Virus by PCR NEGATIVE NEGATIVE   Imaging Studies: DG Chest Port 1 View  Result Date: 03/08/2023 CLINICAL DATA:  Respiratory distress EXAM: PORTABLE CHEST 1 VIEW COMPARISON:  03/04/2023 FINDINGS: The heart size and mediastinal contours are within normal limits. Both lungs are clear. The visualized skeletal structures are unremarkable. IMPRESSION: No active disease. Electronically Signed   By: Lucienne Capers M.D.   On: 03/08/2023 03:44    ED COURSE and MDM  Nursing notes, initial and subsequent vitals signs, including pulse oximetry, reviewed and interpreted by myself.  Vitals:   03/08/23 0346 03/08/23 0400 03/08/23 0400 03/08/23 0415  BP:  (!) 118/96  (!) 114/91  Pulse:  (!) 25  (!) 111  Resp:  (!) 24    Temp:   97.8 F (36.6 C)   TempSrc:   Axillary   SpO2:  100%  100%  Weight: 67.8 kg      Medications  diazepam (VALIUM) injection 5 mg (5 mg Intravenous Given 03/08/23 0352)  ketorolac (TORADOL) 15 MG/ML injection 15 mg (15 mg Intravenous Given 03/08/23 0426)   3:36 AM Patient placed on BiPAP on arrival.  3:51 AM Venous blood gas shows a normal pH with a mildly elevated pCO2.  This is not consistent with respiratory failure requiring intubation at this point.  We will give 5 mg of IV Valium to help calm the patient as she is still fairly agitated.  4:07 AM Much more calm after IV Valium.  Not fighting the BiPAP.  4:56 AM Patient given Toradol after complaints of abdominal pain.  I suspect this is musculoskeletal pain from  assessor muscle use.  Dr. Claria Dice accepts for admission to the hospitalist service.  6:35 AM Patient breathing comfortably off BiPAP.  Oxygen saturation around 90%, patient still on nasal cannula.  Awaiting hospitalist admission.  PROCEDURES  Procedures CRITICAL CARE Performed by: Karen Chafe Fahed Morten Total critical care time: 35 minutes Critical care time was exclusive of separately billable procedures and treating other patients. Critical care was necessary to treat or prevent imminent or life-threatening deterioration. Critical care was time spent personally by me on the following activities: development of treatment plan with patient and/or surrogate as well as nursing, discussions with consultants, evaluation of patient's response to treatment, examination of patient, obtaining history from patient  or surrogate, ordering and performing treatments and interventions, ordering and review of laboratory studies, ordering and review of radiographic studies, pulse oximetry and re-evaluation of patient's condition.   ED DIAGNOSES     ICD-10-CM   1. Respiratory distress  R06.03     2. COPD exacerbation ([place])  J44.1          Ynez Eugenio, MD 03/08/23 0456    Shanon Rosser, MD 03/08/23 [phone]

## 2023-03-08 NOTE — H&P (Signed)
History and Physical    Patient: Kathleen [name] [id number] DOB: 08/21/1961 DOA: 03/08/2023 DOS: the patient was seen and examined on 03/08/2023 PCP: Riesa Pope, MD  Patient coming from: Home  Chief Complaint:  Chief Complaint  Patient presents with   Respiratory Distress   HPI: Kathleen [name] is a 62 y.o. female with medical history significant of anxiety, depression, osteoarthritis, bipolar 1 disorder, history of breast lump, chronic headache, chronic lower back pain, GERD, hallucinations, hyperlipidemia, lower extremity edema, menorrhagia, ovarian cyst, polysubstance abuse, sleep apnea not on CPAP, history of heatstroke, thrombosis of ovarian vein, tubular adenoma of the colon, asthma/COPD who presented to the emergency department complaints of progressively worse dyspnea associated with occasionally productive cough of whitish sputum, wheezing and fatigue that did not improve to her home use of inhalers and nebulized bronchodilators.  EMS was called and found her to be diaphoretic, in tripod position with an oxygen saturation of 86% on room air.  They gave her albuterol 5+ ipratropium 0.5 mg via neb, 0.3 mg of epinephrine SQ, 125 mg of methylprednisolone and 2 g of magnesium sulfate before arriving to the hospital.  Lab work: CBC is her white count 13.7, hemoglobin 15.2 g/dL platelets 333.  Venous blood gas showed a pCO2 of 62 and pO2 of 66 mmHg.  Bicarbonate was 28.5 mmol/L.  Negative coronavirus, influenza and RSV PCR.  Normal troponin and alcohol level.  BMP was also normal, except for glucose of 123 mg/dL.  Imaging: Portable 1 view chest radiograph with no active disease.  ED course: Initial vital signs were temperature 97.9 F, pulse 126, respirations 27, BP 137/103 mmHg and O2 sat 100% on supplemental oxygen.  She received ketorolac 15 mg IVP, fentanyl 25 mcg IVP diazepam 5 mg IVP and was placed on BiPAP in the emergency department.   Review of Systems: As mentioned in the  history of present illness. All other systems reviewed and are negative. Past Medical History:  Diagnosis Date   Anxiety    Arthritis    hands   Asthma    2 sets of PFT's in 04/09 without sign variability. Last set with significant decrease in FEV1 with saline alone, suggesting Asthma but  recommended clinical corelation    Asthma    Bipolar 1 disorder [facility])    therapist is Joy and is followed by [facility]   Blackout    negative work up including ESR, ANA, opthalmology referral, carotid dopplers, 2D echo, MRI and EEG.   BREAST LUMP 03/25/2008   Annotation: bilaterally Qualifier: Diagnosis of  By: Tomasa Hosteller MD, Edmon Crape.    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 Further work up includes CRP, ESR, SPEP/UPEP, hepatitis erology, A1C , repeat ANA   COPD (chronic obstructive pulmonary disease) ([place])    COPD exacerbation ([place]) 03/31/2019   Depression    DUB (dysfunctional uterine bleeding)    and pelvic pain, with negative endometrial bx in 07/09 and transvaginal U/S significant for mild fibroids in 0/09.   Emphysema of lung (HCC)    GERD (gastroesophageal reflux disease)    Hallucinations 03/18/2008  Qualifier: Diagnosis of  By: Redmond Pulling  MD, Mateo Flow     Hyperlipidemia    Lower extremity edema    Neg ABI's, normal 2D echo, normal albumin   Menorrhagia    Ovarian cyst    Polysubstance abuse ([place])    none since March 17,2009.   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 She has a 7.50 pack-year smoking history. She has never used smokeless tobacco. She reports current alcohol use. She reports current drug use. Drug: Marijuana.  Allergies  Allergen Reactions   Bee Venom Anaphylaxis   Peach [Prunus Persica] Anaphylaxis, Swelling and Other (See Comments)    Throat swells , breaks out   Topamax [Topiramate] Other (See Comments)    Hallucinations    Tramadol Nausea Only    Family History  Problem Relation Age of Onset   Colon cancer Mother 19   Breast cancer Mother 31   Rectal cancer Mother    Diabetes Father    Hypertension Father    Kidney disease Father    Colon cancer Father 57   Prostate cancer Father    Cancer Father    Kidney disease Sister    Cancer Brother    Breast cancer Maternal Grandmother 103   Esophageal cancer Neg Hx    Stomach cancer Neg Hx     Prior to Admission medications   Medication Sig Start Date End Date Taking? Authorizing Provider  albuterol (PROVENTIL) (2.5 MG/3ML) 0.083% nebulizer solution Take 3 mLs (2.5 mg total) by nebulization every 4 (four) hours as needed for wheezing or shortness of breath. 11/17/22  Yes Kathie Dike, MD  aspirin EC 81 MG tablet Take 81 mg by mouth daily. Swallow whole.   Yes [provider]  albuterol (VENTOLIN HFA) 108 (90 Base) MCG/ACT inhaler Inhale 2 puffs into the lungs every 6 (six) hours as needed for wheezing or shortness of breath. Patient not taking: Reported on 03/08/2023 02/01/23   Nita Sells, MD  azithromycin (ZITHROMAX) 250 MG tablet Take 1 tablet (250 mg total) by mouth daily until gone 02/02/23   Nita Sells, MD  EPINEPHrine (EPIPEN 2-PAK) 0.3 mg/0.3 mL IJ SOAJ injection Inject 0.3 mg into the muscle as needed for anaphylaxis. 02/01/23   Nita Sells, MD   Fluticasone-Umeclidin-Vilant (TRELEGY ELLIPTA) 200-62.5-25 MCG/ACT AEPB Inhale 2 puffs into the lungs daily. 02/01/23   Nita Sells, MD  guaiFENesin-dextromethorphan (ROBITUSSIN DM) 100-10 MG/5ML syrup Take 10 mLs by mouth every 4 (four) hours as needed for cough. 02/01/23   Nita Sells, MD  hydrOXYzine (ATARAX) 25 MG tablet Take 1 tablet (25 mg total) by mouth 3 (three) times daily as needed for anxiety. 11/17/22   Kathie Dike, MD  ipratropium-albuterol (DUONEB) 0.5-2.5 (3) MG/3ML SOLN Take 3 mLs by nebulization every 2 (two) hours as needed. 01/26/23   Charlesetta Shanks, MD  lidocaine (LIDODERM) 5 % Place 1 patch onto the skin daily. Remove & Discard patch  within 12 hours or as directed by MD 02/01/23   Nita Sells, MD  loratadine (CLARITIN) 10 MG tablet Take 1 tablet (10 mg total) by mouth daily. 02/01/23   Nita Sells, MD  methocarbamol (ROBAXIN) 500 MG tablet Take 1 tablet (500 mg total) by mouth every 6 (six) hours as needed for muscle spasms. 02/01/23   Nita Sells, MD  montelukast (SINGULAIR) 10 MG tablet Take 1 tablet (10 mg total) by mouth at bedtime. 02/01/23   Nita Sells, MD  Nicotine 21-14-7 MG/24HR KIT Take as prescribed 02/01/23   Nita Sells, MD  ondansetron (ZOFRAN) 4 MG tablet TAKE 1 TABLET BY MOUTH ONCE DAILY AS NEEDED FOR NAUSEA FOR VOMITING Patient taking differently: Take 4 mg by mouth daily as needed for nausea or vomiting. 02/18/21   [name], MD  pantoprazole (PROTONIX) 40 MG tablet Take 1 tablet (40 mg total) by mouth 2 (two) times daily before a meal. 02/01/23   Nita Sells, MD    Physical Exam: Vitals:   03/08/23 0600 03/08/23 0615 03/08/23 0645 03/08/23 0730  BP: 104/63 114/78 110/84 (!) 124/90  Pulse: (!) 103 (!) 102 92 (!) 111  Resp: (!) 28     Temp:    97.9 F (36.6 C)  TempSrc:    Oral  SpO2: 93% 92% 99% 98%  Weight:       Physical Exam Vitals and nursing note reviewed.   Constitutional:      General: She is awake. She is not in acute distress.    Appearance: Normal appearance.     Interventions: Nasal cannula in place.  HENT:     Head: Normocephalic.     Nose: No rhinorrhea.     Mouth/Throat:     Mouth: Mucous membranes are moist.  Eyes:     General: No scleral icterus.    Pupils: Pupils are equal, round, and reactive to light.  Neck:     Vascular: No JVD.  Cardiovascular:     Rate and Rhythm: Normal rate and regular rhythm.     Heart sounds: S1 normal and S2 normal.  Pulmonary:     Effort: Pulmonary effort is normal. No accessory muscle usage.     Breath sounds: Wheezing and rhonchi present. No rales.  Abdominal:     General: Bowel sounds are normal.     Palpations: Abdomen is soft.     Tenderness: There is abdominal tenderness. There is no right CVA tenderness, left CVA tenderness, guarding or rebound.     Comments: Abdominal muscle tenderness from coughing.  Musculoskeletal:     Cervical back: Neck supple.     Right lower leg: No edema.     Left lower leg: No edema.  Skin:    General: Skin is warm and dry.  Neurological:     General: No focal deficit present.     Mental Status: She is alert and oriented to person, place, and time.  Psychiatric:        Mood and Affect: Mood normal.        Behavior: Behavior normal. Behavior is cooperative.   Data Reviewed:  Results are pending, will review when available.  Assessment and Plan: Principal Problem:   Acute respiratory failure with hypoxia and hypercapnia (HCC) In the setting of:   Asthma-COPD overlap syndrome   COPD exacerbation (HCC) Observation/telemetry Continue supplemental oxygen. Scheduled and as needed bronchodilators. Methylprednisolone 125 mg IVP x1. Followed by prednisone 40 mg p.o. daily in a.m. Resume montelukast 10 mg p.o.  at bedtime. Antitussive as needed. Analgesics PRN pleuritic CP and abdominal muscle pain Follow-up CBC and chemistry in the morning.  BiPAP as  needed and at bedtime.  Active Problems:   OSA (obstructive sleep apnea)  BiPAP at bedtime. Continue COPD treatment as above.    GERD Resume pantoprazole 40 mg p.o. daily.    Tobacco abuse Stated she quit smoking in February.    Chronic back pain Continue methocarbamol 500 mg p.o. q6 h PRN. Continue Lidoderm patches every 12 hours.      Advance Care Planning:   Code Status: Full Code   Consults:   Family Communication:   Severity of Illness: The appropriate patient status for this patient is INPATIENT. Inpatient status is judged to be reasonable and necessary in order to provide the required intensity of service to ensure the patient's safety. The patient's presenting symptoms, physical exam findings, and initial radiographic and laboratory data in the context of their chronic comorbidities is felt to place them at high risk for further clinical deterioration. Furthermore, it is not anticipated that the patient will be medically stable for discharge from the hospital within 2 midnights of admission.   * I certify that at the point of admission it is my clinical judgment that the patient will require inpatient hospital care spanning beyond 2 midnights from the point of admission due to high intensity of service, high risk for further deterioration and high frequency of surveillance required.*  Author: Reubin Milan, MD 03/08/2023 7:55 AM  For on call review [email or web address].   This document was prepared using Dragon voice recognition software and may contain some unintended transcription errors.

## 2023-03-08 NOTE — Progress Notes (Addendum)
[facility] admitting physician addendum:  The patient requested a change from pantoprazole to omeprazole.  However, omeprazole is not available as it is nonformulary.  Famotidine 20 mg p.o. x 1 and Maalox 30 mL every 4 hours as needed order.  Tennis Must, MD  Around [phone] the nursing staff informed me that the patient had a large episode of emesis.  She had already received ondansetron earlier.  Prochlorperazine 10 mg IVP and NS at 125 mL/h x 8 hours ordered.

## 2023-03-08 NOTE — ED Notes (Signed)
Pt found to have spo2 of 89% on RA  pt placed on 2l [place] pt recovered to 99%

## 2023-03-08 NOTE — TOC Initial Note (Signed)
Transition of Care [facility]) - Initial/Assessment Note    Patient Details  Name: Kathleen [name] MRN: [id number] Date of Birth: [birth year]  [facility]) CM/SW Contact:    Dessa Phi, RN Phone Number: 03/08/2023, 3:57 PM  Clinical Narrative: Received referrals for safety issues, restraining order,medical needs-spoke to patient about concerns-patient explained that home is safe,has support, restraining order-is already addressed w/court system-she will continue to f/u;[employer] issues to be addressed w/medicaid case worker,ensure to be addressed by attending. May need script for ensure if appropriate, & nutrition cons-if appropriate. Has own transport home.Noted on 02-may need @ d/c-will await documented 02 sats, & order if appropriate. No preference for home 02- Continue to monitor.                  Expected Discharge Plan: Home/Self Care Barriers to Discharge: Continued Medical Work up   Patient Goals and CMS Choice Patient states their goals for this hospitalization and ongoing recovery are::  (Home) CMS Medicare.gov Compare Post Acute Care list provided to:: Patient Choice offered to / list presented to : Patient [place] ownership interest in [facility].provided to:: Patient    Expected Discharge Plan and Services   Discharge Planning Services: CM Consult Post Acute Care Choice: Resumption of Svcs/PTA Provider Living arrangements for the past 2 months: Single Family Home                                      Prior Living Arrangements/Services Living arrangements for the past 2 months: Single Family Home Lives with:: Adult Children Patient language and need for interpreter reviewed:: Yes Do you feel safe going back to the place where you live?: Yes      Need for Family Participation in Patient Care: Yes (Comment) Care giver support system in place?: Yes (comment)   Criminal Activity/Legal Involvement Pertinent to Current  Situation/Hospitalization: No - Comment as needed  Activities of Daily Living Home Assistive Devices/Equipment: None (Has multiple supplies from sister who passed. Does not use) ADL Screening (condition at time of admission) Patient's cognitive ability adequate to safely complete daily activities?: Yes Is the patient deaf or have difficulty hearing?: No Does the patient have difficulty seeing, even when wearing glasses/contacts?: No Does the patient have difficulty concentrating, remembering, or making decisions?: No Patient able to express need for assistance with ADLs?: No Does the patient have difficulty dressing or bathing?: No Independently performs ADLs?: Yes (appropriate for developmental age) Does the patient have difficulty walking or climbing stairs?: Yes (dyspnea) Weakness of Legs: None Weakness of Arms/Hands: None  Permission Sought/Granted Permission sought to share information with : Case Manager Permission granted to share information with : Yes, Verbal Permission Granted  Share Information with NAME:  (Case Manager)           Emotional Assessment Appearance:: Appears stated age Attitude/Demeanor/Rapport: Gracious Affect (typically observed): Accepting Orientation: : Oriented to Self, Oriented to Place, Oriented to  Time, Oriented to Situation Alcohol / Substance Use: Not Applicable Psych Involvement: No (comment)  Admission diagnosis:  Respiratory distress [R06.03] COPD exacerbation (HCC) [J44.1] Acute respiratory failure with hypoxia and hypercapnia (HCC) [J96.01, J96.02] Acute respiratory failure with hypoxia (HCC) [J96.01] Patient Active Problem List   Diagnosis Date Noted   Acute respiratory failure with hypoxia and hypercapnia (HCC) 03/08/2023   Chronic back pain 03/08/2023   OSA (obstructive sleep apnea) 03/08/2023   Acute respiratory  failure with hypoxia ([place]) 03/08/2023   COPD exacerbation ([place]) 01/30/2023   Syncope and collapse 01/30/2023    Malnutrition of moderate degree 11/17/2022   Marijuana abuse 08/17/2022   Weight loss 07/07/2022   Sialoadenitis 03/16/2022   Skin growth 08/28/2021   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   Daytime somnolence 04/06/2019   Lumbar radiculopathy 07/26/2016   Depression 09/19/2015   Preventative health care 06/18/2014   Tobacco abuse 03/13/2013   GERD 01/21/2010   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   Generalized anxiety disorder 03/14/2008   Migraine variant 03/14/2008   PCP:  Riesa Pope, MD Pharmacy:   [facility] [address], [place] [address] Phone: [phone] Fax: [place] [address] Phone: [phone] Fax: [phone]     Social Determinants of Health (SDOH) Social History: SDOH Screenings   Food Insecurity: No Food Insecurity (03/08/2023)  Housing: Medium Risk (03/08/2023)  Transportation Needs: No Transportation Needs (03/08/2023)  Utilities: Not At Risk (03/08/2023)  Depression (PHQ2-9): High Risk (07/28/2022)  Tobacco Use: High Risk (03/08/2023)   SDOH Interventions: Housing Interventions: Inpatient TOC   Readmission Risk Interventions    02/01/2023    1:33 PM 08/18/2022   10:44 AM  Readmission Risk Prevention Plan  Transportation Screening Complete Complete  PCP or Specialist Appt within 5-7 Days  Complete  Home Care Screening  Complete  Medication Review (RN CM)  Complete  Medication Review (RN Care Manager) Complete   HRI or [place] Complete   SW Recovery Care/Counseling Consult Complete   [place] Not Applicable

## 2023-03-08 NOTE — ED Notes (Signed)
Patient given sandwich, cola, graham crackers and peanut butter per patient request.

## 2023-03-08 NOTE — ED Triage Notes (Signed)
Pt in via [facility] with respiratory distress, cough and sob developing over the past 24hrs. Pt's breathing very labored on arrival, wearing 15L NRB with tripod breathing, diaphoretic. EMS states initial sats 86% on RA, hx of asthma.  Treatment en route: 5mg  Albuterol 0.5mg  Atrovent 0.3mg  Epi  125mg  Solumedrol 2gMg

## 2023-03-08 NOTE — Progress Notes (Signed)
Pt was initially willing to wear bipap tonight but is now refusing.  Pt stated she does not wear cpap or bipap at home.  Rn aware.  Pt was advised that RT is available all night should she change her mind.

## 2023-03-08 NOTE — ED Notes (Signed)
Pt requesting water at this time pt educated on inability to have water with the bipap mask on

## 2023-03-09 DIAGNOSIS — J9602 Acute respiratory failure with hypercapnia: Secondary | ICD-10-CM | POA: Diagnosis not present

## 2023-03-09 DIAGNOSIS — J9601 Acute respiratory failure with hypoxia: Secondary | ICD-10-CM | POA: Diagnosis not present

## 2023-03-09 LAB — COMPREHENSIVE METABOLIC PANEL
ALT: 28 U/L (ref 0–44)
AST: 23 U/L (ref 15–41)
Albumin: 3.5 g/dL (ref 3.5–5.0)
Alkaline Phosphatase: 65 U/L (ref 38–126)
Anion gap: 7 (ref 5–15)
BUN: 15 mg/dL (ref 8–23)
CO2: 28 mmol/L (ref 22–32)
Calcium: 8.9 mg/dL (ref 8.9–10.3)
Chloride: 102 mmol/L (ref 98–111)
Creatinine, Ser: 0.94 mg/dL (ref 0.44–1.00)
GFR, Estimated: >60 mL/min (ref >60)
Glucose, Bld: 96 mg/dL (ref 70–99)
Potassium: 4 mmol/L (ref 3.5–5.1)
Sodium: 137 mmol/L (ref 135–145)
Total Bilirubin: 0.3 mg/dL (ref 0.3–1.2)
Total Protein: 5.8 g/dL — ABNORMAL LOW (ref 6.5–8.1)

## 2023-03-09 LAB — CBC
HCT: 41.1 % (ref 36.0–46.0)
Hemoglobin: 13.1 g/dL (ref 12.0–15.0)
MCH: 28.1 pg (ref 26.0–34.0)
MCHC: 31.9 g/dL (ref 30.0–36.0)
MCV: 88 fL (ref 80.0–100.0)
Platelets: 263 10*3/uL (ref 150–400)
RBC: 4.67 MIL/uL (ref 3.87–5.11)
RDW: 13.4 % (ref 11.5–15.5)
WBC: 17.1 10*3/uL — ABNORMAL HIGH (ref 4.0–10.5)
nRBC: 0 % (ref 0.0–0.2)

## 2023-03-09 MED ORDER — UMECLIDINIUM BROMIDE 62.5 MCG/ACT IN AEPB
1.0000 | INHALATION_SPRAY | Freq: Every day | RESPIRATORY_TRACT | Status: DC
  Administered 2023-03-09 – 2023-03-10 (×2): 1 via RESPIRATORY_TRACT
  Filled 2023-03-09: qty 7

## 2023-03-09 MED ORDER — FLUTICASONE FUROATE-VILANTEROL 200-25 MCG/ACT IN AEPB
1.0000 | INHALATION_SPRAY | Freq: Every day | RESPIRATORY_TRACT | Status: DC
  Administered 2023-03-09 – 2023-03-10 (×2): 1 via RESPIRATORY_TRACT
  Filled 2023-03-09: qty 28

## 2023-03-09 MED ORDER — DOXYCYCLINE HYCLATE 100 MG PO TABS
100.0000 mg | ORAL_TABLET | Freq: Two times a day (BID) | ORAL | Status: DC
  Administered 2023-03-09 (×2): 100 mg via ORAL
  Filled 2023-03-09 (×2): qty 1

## 2023-03-09 MED ORDER — ENSURE ENLIVE PO LIQD
237.0000 mL | Freq: Two times a day (BID) | ORAL | Status: DC
  Administered 2023-03-09: 237 mL via ORAL

## 2023-03-09 NOTE — Progress Notes (Addendum)
SATURATION QUALIFICATIONS: (This note is used to comply with regulatory documentation for home oxygen)  Patient Saturations on Room Air at Rest = 92%  Patient Saturations on Room Air while Ambulating = 83%  Patient Saturations on 2 Liters of oxygen while Ambulating = 95%  Please briefly explain why patient needs home oxygen:Patient with oxygen desaturations while ambulating on room air

## 2023-03-09 NOTE — TOC Progression Note (Signed)
Transition of Care [facility]) - Progression Note    Patient Details  Name: Kathleen [name] MRN: [id number] Date of Birth: 1961-07-25  Transition of Care [facility]) CM/SW Contact  Minda Faas, Juliann Pulse, RN Phone Number: 03/09/2023, 10:03 AM  Clinical Narrative:  Noted 02 sats, & home oxygen order-[employer] rep Jermaine to deliver home 02 travel tank to rm prior d/c.     Expected Discharge Plan: Home/Self Care Barriers to Discharge: Continued Medical Work up  Expected Discharge Plan and Services   Discharge Planning Services: CM Consult Post Acute Care Choice: Resumption of Svcs/PTA Provider Living arrangements for the past 2 months: Single Family Home                 DME Arranged: Oxygen DME Agency: [employer] Date DME Agency Contacted: 03/09/23 Time DME Agency Contacted: [phone] Representative spoke with at DME Agency: [place] Determinants of Health ([place]) Interventions SDOH Screenings   Food Insecurity: No Food Insecurity (03/08/2023)  Housing: Medium Risk (03/08/2023)  Transportation Needs: No Transportation Needs (03/08/2023)  Utilities: Not At Risk (03/08/2023)  Depression (PHQ2-9): High Risk (07/28/2022)  Tobacco Use: High Risk (03/08/2023)    Readmission Risk Interventions    03/08/2023    4:05 PM 02/01/2023    1:33 PM 08/18/2022   10:44 AM  Readmission Risk Prevention Plan  Transportation Screening Complete Complete Complete  PCP or Specialist Appt within 5-7 Days   Complete  PCP or Specialist Appt within 3-5 Days Complete    Home Care Screening   Complete  Medication Review (RN CM)   Complete  HRI or [place] Complete    Social Work Consult for [place] Planning/Counseling Complete    Palliative Care Screening Not Applicable    Medication Review Press photographer) Complete Complete   HRI or [place]  Complete   SW Recovery Care/Counseling Consult  Complete   [place]   Not Applicable

## 2023-03-09 NOTE — Progress Notes (Signed)
PROGRESS NOTE  Kathleen [name] [id number] DOB: 1961/07/18 DOA: 03/08/2023 PCP: Riesa Pope, MD   LOS: 1 day   Brief Narrative / Interim history: 62 year old female with asthma/COPD, chronic pain, OSA not using CPAP, migraines comes to the hospital with shortness of breath.  She occasionally gets exacerbation of her COPD with weather changes, also exposure to strong smells.  This time around she feels it was due to weather.  She is also noticed increased sputum production.  Subjective / 24h Interval events: Still wheezing this morning, feels a bit better but with ongoing shortness of breath with walking  Assesement and Plan: Principal Problem:   Acute respiratory failure with hypoxia and hypercapnia (HCC) Active Problems:   GERD   Tobacco abuse   Asthma-COPD overlap syndrome   COPD exacerbation (HCC)   Chronic back pain   OSA (obstructive sleep apnea)   Acute respiratory failure with hypoxia (HCC)  Principal problem Acute hypoxic respiratory failure, underlying asthma/COPD with exacerbation -suspect chronic hypoxia given long-standing dyspnea.  For now continue steroids, placed on antibiotics due to productive cough, nebulizers/inhalers.  Already improving.  Continue to closely monitor -She qualifies for home O2, TOC consulted  Active problems Tobacco use -quit, no smoking except for last week when she was at a party, she had 1 cigarette.  Encouraged cessation  GERD -continue PPI  Chronic back pain -continue Robaxin, Lidoderm  Scheduled Meds:  aspirin EC  81 mg Oral Daily   doxycycline  100 mg Oral Q12H   enoxaparin (LOVENOX) injection  40 mg Subcutaneous Q24H   fluticasone furoate-vilanterol  1 puff Inhalation Daily   ipratropium-albuterol  3 mL Nebulization TID   lidocaine  1 patch Transdermal Daily   montelukast  10 mg Oral QHS   pantoprazole  40 mg Oral Daily   predniSONE  40 mg Oral Q breakfast   umeclidinium bromide  1 puff Inhalation Daily    Continuous Infusions: PRN Meds:.acetaminophen **OR** acetaminophen, albuterol, alum & mag hydroxide-simeth, guaiFENesin-dextromethorphan, methocarbamol, ondansetron **OR** ondansetron (ZOFRAN) IV, oxyCODONE, prochlorperazine  Current Outpatient Medications  Medication Instructions   albuterol (PROVENTIL) 2.5 mg, Nebulization, Every 4 hours PRN   albuterol (VENTOLIN HFA) 108 (90 Base) MCG/ACT inhaler 2 puffs, Inhalation, Every 6 hours PRN   aspirin EC 81 mg, Oral, Daily, Swallow whole.   azithromycin (ZITHROMAX) 250 MG tablet Take 1 tablet (250 mg total) by mouth daily until gone   Cyanocobalamin (B-12 PO) 1 tablet, Oral, Daily   EPINEPHrine (EPIPEN 2-PAK) 0.3 mg, Intramuscular, As needed   Fluticasone-Umeclidin-Vilant (TRELEGY ELLIPTA) 200-62.5-25 MCG/ACT AEPB 2 puffs, Inhalation, Daily   guaiFENesin-dextromethorphan (ROBITUSSIN DM) 100-10 MG/5ML syrup 10 mLs, Oral, Every 4 hours PRN   hydrOXYzine (ATARAX) 25 mg, Oral, 3 times daily PRN   ipratropium-albuterol (DUONEB) 0.5-2.5 (3) MG/3ML SOLN 3 mLs, Nebulization, Every 2 hours PRN   lidocaine (LIDODERM) 5 % 1 patch, Transdermal, Every 24 hours, Remove & Discard patch within 12 hours or as directed by MD   loratadine (CLARITIN) 10 mg, Oral, Daily   methocarbamol (ROBAXIN) 500 mg, Oral, Every 6 hours PRN   montelukast (SINGULAIR) 10 mg, Oral, Daily at bedtime   Multiple Vitamin (MULTIVITAMIN PO) 1 tablet, Oral, Daily   Nicotine 21-14-7 MG/24HR KIT Take as prescribed   ondansetron (ZOFRAN) 4 MG tablet TAKE 1 TABLET BY MOUTH ONCE DAILY AS NEEDED FOR NAUSEA FOR VOMITING   pantoprazole (PROTONIX) 40 mg, Oral, 2 times daily before meals    Diet Orders (From admission, onward)  Start     Ordered   03/09/23 0811  Diet regular Room service appropriate? Yes; Fluid consistency: Thin  Diet effective now       Question Answer Comment  Room service appropriate? Yes   Fluid consistency: Thin      03/09/23 0810            DVT  prophylaxis: enoxaparin (LOVENOX) injection 40 mg Start: 03/08/23 0800   Lab Results  Component Value Date   PLT 263 03/09/2023      Code Status: Full Code  Family Communication: no family present  Status is: Inpatient  Remains inpatient appropriate because: severity of illness  Level of care: Progressive  Consultants:  none  Objective: Vitals:   03/08/23 2006 03/08/23 2123 03/09/23 0522 03/09/23 0812  BP:  119/72 124/84   Pulse:  80 69   Resp:      Temp:  98.2 F (36.8 C) 98.2 F (36.8 C)   TempSrc:  Oral Oral   SpO2: 94% 97% 95% 93%  Weight:      Height:        Intake/Output Summary (Last 24 hours) at 03/09/2023 1015 Last data filed at 03/08/2023 2339 Gross per 24 hour  Intake 1092.46 ml  Output 1000 ml  Net 92.46 ml   Wt Readings from Last 3 Encounters:  03/08/23 67.8 kg  01/26/23 67.8 kg  11/14/22 67.8 kg    Examination:  Constitutional: NAD Eyes: no scleral icterus ENMT: Mucous membranes are moist.  Neck: normal, supple Respiratory: Diffuse bilateral inspiratory and end expiratory wheezing. Cardiovascular: Regular rate and rhythm, no murmurs / rubs / gallops. No LE edema.  Abdomen: non distended, no tenderness. Bowel sounds positive.  Musculoskeletal: no clubbing / cyanosis.  Skin: no rashes Neurologic: non focal   Data Reviewed: I have independently reviewed following labs and imaging studies   CBC Recent Labs  Lab 03/04/23 0034 03/08/23 0333 03/09/23 0526  WBC 8.6 13.7* 17.1*  HGB 15.8* 15.2* 13.1  HCT 48.8* 47.3* 41.1  PLT 342 333 263  MCV 86.7 87.1 88.0  MCH 28.1 28.0 28.1  MCHC 32.4 32.1 31.9  RDW 13.2 13.2 13.4  LYMPHSABS 3.3 5.1*  --   MONOABS 0.7 1.4*  --   EOSABS 0.5 1.0*  --   BASOSABS 0.1 0.1  --     Recent Labs  Lab 03/04/23 0057 03/08/23 0333 03/09/23 0526  NA 138 137 137  K 3.1* 4.7 4.0  CL 105 105 102  CO2 22 23 28   GLUCOSE 106* 123* 96  BUN 12 11 15   CREATININE 0.95 0.79 0.94  CALCIUM 8.9 9.1 8.9  AST  27  --  23  ALT 26  --  28  ALKPHOS 76  --  65  BILITOT 0.4  --  0.3  ALBUMIN 4.0  --  3.5    ------------------------------------------------------------------------------------------------------------------ No results for input(s): "CHOL", "HDL", "LDLCALC", "TRIG", "CHOLHDL", "LDLDIRECT" in the last 72 hours.  Lab Results  Component Value Date   HGBA1C 5.0 10/08/2008   ------------------------------------------------------------------------------------------------------------------ No results for input(s): "TSH", "T4TOTAL", "T3FREE", "THYROIDAB" in the last 72 hours.  Invalid input(s): "FREET3"  Cardiac Enzymes No results for input(s): "CKMB", "TROPONINI", "MYOGLOBIN" in the last 168 hours.  Invalid input(s): "CK" ------------------------------------------------------------------------------------------------------------------    Component Value Date/Time   BNP 24.5 01/30/2023 0552    CBG: No results for input(s): "GLUCAP" in the last 168 hours.  Recent Results (from the past 240 hour(s))  Resp panel by RT-PCR (RSV, Flu  A&B, Covid) Anterior Nasal Swab     Status: None   Collection Time: 03/08/23  3:48 AM   Specimen: Anterior Nasal Swab  Result Value Ref Range Status   SARS Coronavirus 2 by RT PCR NEGATIVE NEGATIVE Final    Comment: (NOTE) SARS-CoV-2 target nucleic acids are NOT DETECTED.  The SARS-CoV-2 RNA is generally detectable in upper respiratory specimens during the acute phase of infection. The lowest concentration of SARS-CoV-2 viral copies this assay can detect is 138 copies/mL. A negative result does not preclude SARS-Cov-2 infection and should not be used as the sole basis for treatment or other patient management decisions. A negative result may occur with  improper specimen collection/handling, submission of specimen other than nasopharyngeal swab, presence of viral mutation(s) within the areas targeted by this assay, and inadequate number of  viral copies(<138 copies/mL). A negative result must be combined with clinical observations, patient history, and epidemiological information. The expected result is Negative.  Fact Sheet for Patients:  EntrepreneurPulse.com.au  Fact Sheet for Healthcare Providers:  IncredibleEmployment.be  This test is no t yet approved or cleared by the [place] FDA and  has been authorized for detection and/or diagnosis of SARS-CoV-2 by FDA under an Emergency Use Authorization (EUA). This EUA will remain  in effect (meaning this test can be used) for the duration of the COVID-19 declaration under Section 564(b)(1) of the Act, 21 U.S.C.section 360bbb-3(b)(1), unless the authorization is terminated  or revoked sooner.       Influenza A by PCR NEGATIVE NEGATIVE Final   Influenza B by PCR NEGATIVE NEGATIVE Final    Comment: (NOTE) The Xpert Xpress SARS-CoV-2/FLU/RSV plus assay is intended as an aid in the diagnosis of influenza from Nasopharyngeal swab specimens and should not be used as a sole basis for treatment. Nasal washings and aspirates are unacceptable for Xpert Xpress SARS-CoV-2/FLU/RSV testing.  Fact Sheet for Patients: EntrepreneurPulse.com.au  Fact Sheet for Healthcare Providers: IncredibleEmployment.be  This test is not yet approved or cleared by the [place] FDA and has been authorized for detection and/or diagnosis of SARS-CoV-2 by FDA under an Emergency Use Authorization (EUA). This EUA will remain in effect (meaning this test can be used) for the duration of the COVID-19 declaration under Section 564(b)(1) of the Act, 21 U.S.C. section 360bbb-3(b)(1), unless the authorization is terminated or revoked.     Resp Syncytial Virus by PCR NEGATIVE NEGATIVE Final    Comment: (NOTE) Fact Sheet for Patients: EntrepreneurPulse.com.au  Fact Sheet for Healthcare  Providers: IncredibleEmployment.be  This test is not yet approved or cleared by the [place] FDA and has been authorized for detection and/or diagnosis of SARS-CoV-2 by FDA under an Emergency Use Authorization (EUA). This EUA will remain in effect (meaning this test can be used) for the duration of the COVID-19 declaration under Section 564(b)(1) of the Act, 21 U.S.C. section 360bbb-3(b)(1), unless the authorization is terminated or revoked.  Performed at [facility], [place] [address], [place],  [address]      Radiology Studies: No results found.   Marzetta Board, MD, PhD [facility]  Between 7 am - 7 pm I am available, please contact me via Amion (for emergencies) or Securechat (non urgent messages)  Between 7 pm - 7 am I am not available, please contact night coverage MD/APP via Amion

## 2023-03-09 NOTE — Progress Notes (Signed)
Pt refused nocturnal bipap.  Pt encouraged to call should she change her mind.  RN aware.

## 2023-03-09 NOTE — Progress Notes (Signed)
Initial Nutrition Assessment  DOCUMENTATION CODES:   Non-severe (moderate) malnutrition in context of social or environmental circumstances  INTERVENTION:  Ensure Plus High Protein po BID, each supplement provides 350 kcal and 20 grams of protein. Provided AND printed handouts regarding soft food/liquid diet  Education on adequate nutrition   NUTRITION DIAGNOSIS:   Moderate Malnutrition related to dysphagia, social / environmental circumstances as evidenced by moderate muscle depletion, moderate fat depletion.   GOAL:   Patient will meet greater than or equal to 90% of their needs   MONITOR:   PO intake, Supplement acceptance, Weight trends  REASON FOR ASSESSMENT:   Consult Diet education  ASSESSMENT:   62 y.o. female with PMHx including anxiety, depression, osteoarthitis, bipolar 1 disorder, history of breast lump, chronic headache, chronic lower back pain, GERD, hallucinations, HLD, lower extremity edema, menorrhagia, ovarian cyst, polysubstance abuse, sleep apnea, history of heatstroke, tubular adenoma of the colon, asthma/COPD who presents with c/o worsening dyspnea and admitted for acute respiratory failure with hypoxia and hypercapnia Labs: nutrition labs WDL  Meds: protonix, deltasone, compazine PRN, zofran PRN Wt: RD predicts CBW is inaccurate  PO: 50% meal intake x 1 meal  I/O's: +92.5 ml   Visited patient at bedside who reports trouble swallowing 2/2 "neck stones" which she has had removed before and they have returned. Patient reports she cannot swallow any meat and other textures. RD observed boiled egg and potatoes at bedside which had gone mostly uneaten.   She reports she used to weigh 160# x 1 year ago and now she  reports weighing ~120#. Patient reports often needing to spit her food out and sometimes beverages too due to fear of choking. She reports spicy foods and several other foods cause her to have flare ups.   Patient reports she eats a boiled egg for  breakfast, and gravitates towards foods like mac n cheese, canned vegetables (absolutely no raw vegetables) and 4 Ensure shakes per day. Patient reports herself as wasting away.   Patient requesting OP RD and is followed at [facility] outpatient clinic   NUTRITION - FOCUSED PHYSICAL EXAM:  Flowsheet Row Most Recent Value  Orbital Region No depletion  Upper Arm Region Moderate depletion  Thoracic and Lumbar Region Moderate depletion  Buccal Region Moderate depletion  Temple Region Moderate depletion  Clavicle Bone Region Severe depletion  Clavicle and Acromion Bone Region Severe depletion  Scapular Bone Region Unable to assess  Dorsal Hand Mild depletion  Patellar Region Moderate depletion  Anterior Thigh Region Moderate depletion  Posterior Calf Region Moderate depletion  Edema (RD Assessment) None  Hair Reviewed  Eyes Reviewed  Mouth Reviewed  Skin Reviewed  Nails Reviewed       Diet Order:   Diet Order             Diet regular Room service appropriate? Yes; Fluid consistency: Thin  Diet effective now                   EDUCATION NEEDS:   Education needs have been addressed  Skin:  Skin Assessment: Reviewed RN Assessment  Last BM:  3/26  Height:   Ht Readings from Last 1 Encounters:  03/08/23 5\' 3"  (1.6 m)    Weight:   Wt Readings from Last 1 Encounters:  03/08/23 67.8 kg    BMI:  Body mass index is 26.48 kg/m.  Estimated Nutritional Needs:   Kcal:  1700-1900  Protein:  80  Fluid:  >/= 2L    Kingsley Spittle  Glennon Mac, RDN, LDN  Clinical Nutrition

## 2023-03-10 ENCOUNTER — Other Ambulatory Visit: Payer: Self-pay | Admitting: Nurse Practitioner

## 2023-03-10 DIAGNOSIS — J9602 Acute respiratory failure with hypercapnia: Secondary | ICD-10-CM | POA: Diagnosis not present

## 2023-03-10 DIAGNOSIS — J9601 Acute respiratory failure with hypoxia: Secondary | ICD-10-CM | POA: Diagnosis not present

## 2023-03-10 LAB — COMPREHENSIVE METABOLIC PANEL
ALT: 28 U/L (ref 0–44)
AST: 25 U/L (ref 15–41)
Albumin: 3.4 g/dL — ABNORMAL LOW (ref 3.5–5.0)
Alkaline Phosphatase: 72 U/L (ref 38–126)
Anion gap: 6 (ref 5–15)
BUN: 17 mg/dL (ref 8–23)
CO2: 30 mmol/L (ref 22–32)
Calcium: 8.8 mg/dL — ABNORMAL LOW (ref 8.9–10.3)
Chloride: 101 mmol/L (ref 98–111)
Creatinine, Ser: 0.93 mg/dL (ref 0.44–1.00)
GFR, Estimated: >60 mL/min (ref >60)
Glucose, Bld: 83 mg/dL (ref 70–99)
Potassium: 4 mmol/L (ref 3.5–5.1)
Sodium: 137 mmol/L (ref 135–145)
Total Bilirubin: 0.4 mg/dL (ref 0.3–1.2)
Total Protein: 5.8 g/dL — ABNORMAL LOW (ref 6.5–8.1)

## 2023-03-10 LAB — CBC
HCT: 44.7 % (ref 36.0–46.0)
Hemoglobin: 13.9 g/dL (ref 12.0–15.0)
MCH: 27.9 pg (ref 26.0–34.0)
MCHC: 31.1 g/dL (ref 30.0–36.0)
MCV: 89.6 fL (ref 80.0–100.0)
Platelets: 262 10*3/uL (ref 150–400)
RBC: 4.99 MIL/uL (ref 3.87–5.11)
RDW: 13.4 % (ref 11.5–15.5)
WBC: 13.5 10*3/uL — ABNORMAL HIGH (ref 4.0–10.5)
nRBC: 0 % (ref 0.0–0.2)

## 2023-03-10 LAB — MAGNESIUM: Magnesium: 2.1 mg/dL (ref 1.7–2.4)

## 2023-03-10 MED ORDER — METHOCARBAMOL 500 MG PO TABS: 500.0000 mg | ORAL_TABLET | Freq: Four times a day (QID) | ORAL | 0 refills | Status: DC | PRN

## 2023-03-10 MED ORDER — ENSURE ENLIVE PO LIQD: 237.0000 mL | Freq: Two times a day (BID) | ORAL | 0 refills | Status: AC

## 2023-03-10 MED ORDER — ALBUTEROL SULFATE HFA 108 (90 BASE) MCG/ACT IN AERS: 2.0000 | INHALATION_SPRAY | Freq: Four times a day (QID) | RESPIRATORY_TRACT | 2 refills | Status: DC | PRN

## 2023-03-10 MED ORDER — BENZONATATE 100 MG PO CAPS: 100.0000 mg | ORAL_CAPSULE | Freq: Four times a day (QID) | ORAL | 0 refills | Status: AC | PRN

## 2023-03-10 MED ORDER — GUAIFENESIN-DM 100-10 MG/5ML PO SYRP: 10.0000 mL | ORAL_SOLUTION | ORAL | 0 refills | Status: DC | PRN

## 2023-03-10 MED ORDER — FLUTICASONE PROPIONATE HFA 110 MCG/ACT IN AERO: 2.0000 | INHALATION_SPRAY | Freq: Two times a day (BID) | RESPIRATORY_TRACT | 2 refills | Status: DC

## 2023-03-10 MED ORDER — DOXYCYCLINE HYCLATE 100 MG PO TABS: 100.0000 mg | ORAL_TABLET | Freq: Two times a day (BID) | ORAL | 0 refills | Status: DC

## 2023-03-10 MED ORDER — PREDNISONE 20 MG PO TABS: ORAL_TABLET | ORAL | 0 refills | Status: AC

## 2023-03-10 MED ORDER — ANORO ELLIPTA 62.5-25 MCG/ACT IN AEPB: 1.0000 | INHALATION_SPRAY | Freq: Every day | RESPIRATORY_TRACT | 2 refills | Status: DC

## 2023-03-10 MED ORDER — TRELEGY ELLIPTA 200-62.5-25 MCG/ACT IN AEPB: 2.0000 | INHALATION_SPRAY | Freq: Every day | RESPIRATORY_TRACT | 1 refills | Status: DC

## 2023-03-10 MED ORDER — OXYCODONE HCL 5 MG PO TABS: 5.0000 mg | ORAL_TABLET | Freq: Four times a day (QID) | ORAL | 0 refills | Status: DC | PRN

## 2023-03-10 MED ORDER — NICOTINE 21 MG/24HR TD PT24: 21.0000 mg | MEDICATED_PATCH | TRANSDERMAL | 0 refills | Status: AC

## 2023-03-10 NOTE — Progress Notes (Signed)
Trelegy not covered by Medicaid- Anoro Ellipta and fluticasone ordered instead- Trelegy dc'd

## 2023-03-10 NOTE — Discharge Summary (Signed)
Physician Discharge Summary  Kathleen [name] [id number] DOB: October 12, 1961 DOA: 03/08/2023  PCP: Riesa Pope, MD  Admit date: 03/08/2023 Discharge date: 03/10/2023  Admitted From: home Disposition:  home  Recommendations for Outpatient Follow-up:  Follow up with PCP in 1-2 weeks  Home Health: none Equipment/Devices: home oxygen  Discharge Condition: stable CODE STATUS: Full code Diet Orders (From admission, onward)     Start     Ordered   03/09/23 0811  Diet regular Room service appropriate? Yes; Fluid consistency: Thin  Diet effective now       Question Answer Comment  Room service appropriate? Yes   Fluid consistency: Thin      03/09/23 0810            HPI: Per admitting MD, Kathleen [name] is a 61 y.o. female with medical history significant of anxiety, depression, osteoarthritis, bipolar 1 disorder, history of breast lump, chronic headache, chronic lower back pain, GERD, hallucinations, hyperlipidemia, lower extremity edema, menorrhagia, ovarian cyst, polysubstance abuse, sleep apnea not on CPAP, history of heatstroke, thrombosis of ovarian vein, tubular adenoma of the colon, asthma/COPD who presented to the emergency department complaints of progressively worse dyspnea associated with occasionally productive cough of whitish sputum, wheezing and fatigue that did not improve to her home use of inhalers and nebulized bronchodilators.  EMS was called and found her to be diaphoretic, in tripod position with an oxygen saturation of 86% on room air.  They gave her albuterol 5+ ipratropium 0.5 mg via neb, 0.3 mg of epinephrine SQ, 125 mg of methylprednisolone and 2 g of magnesium sulfate before arriving to the hospital.   Hospital Course / Discharge diagnoses: Principal Problem:   Acute respiratory failure with hypoxia and hypercapnia (HCC) Active Problems:   GERD   Tobacco abuse   Asthma-COPD overlap syndrome   COPD exacerbation (HCC)   Chronic back pain   OSA  (obstructive sleep apnea)   Acute respiratory failure with hypoxia (HCC)   Principal problem Acute hypoxic respiratory failure, underlying asthma/COPD with exacerbation -suspect chronic hypoxia given long-standing dyspnea.  Patient was placed on steroids, antibiotics, nebulizers with improvement in her respiratory status.  Wheezing has resolved, she is able to ambulate in the hallway without difficulties.  She does desat with ambulation and qualifies for home oxygen, which again I think this is chronic.  She will be discharged home in stable condition, was advised to follow-up with within a week.  She will go home on a few days of antibiotics, steroid taper, as well as limited pain medications for her chest pain in the setting of coughing spells.  Active problems Tobacco use -quit, no smoking except for last week when she was at a party, she had 1 cigarette.  Encouraged cessation GERD -continue PPI Chronic back pain -continue Robaxin, Lidoderm  Sepsis ruled out   Discharge Instructions   Allergies as of 03/10/2023       Reactions   Bee Venom Anaphylaxis   Peach [prunus Persica] Anaphylaxis, Swelling, Other (See Comments)   Throat swells , breaks out   Topamax [topiramate] Other (See Comments)   Hallucinations    Tramadol Nausea Only        Medication List     STOP taking these medications    azithromycin 250 MG tablet Commonly known as: ZITHROMAX   Nicotine 21-14-7 MG/24HR Kit Replaced by: nicotine 21 mg/24hr patch       TAKE these medications    albuterol (2.5 MG/3ML) 0.083% nebulizer solution Commonly  known as: PROVENTIL Take 3 mLs (2.5 mg total) by nebulization every 4 (four) hours as needed for wheezing or shortness of breath.   albuterol 108 (90 Base) MCG/ACT inhaler Commonly known as: VENTOLIN HFA Inhale 2 puffs into the lungs every 6 (six) hours as needed for wheezing or shortness of breath.   aspirin EC 81 MG tablet Take 81 mg by mouth daily. Swallow  whole.   B-12 PO Take 1 tablet by mouth daily.   benzonatate 100 MG capsule Commonly known as: Tessalon Perles Take 1 capsule (100 mg total) by mouth every 6 (six) hours as needed for cough.   doxycycline 100 MG tablet Commonly known as: VIBRA-TABS Take 1 tablet (100 mg total) by mouth every 12 (twelve) hours.   EPINEPHrine 0.3 mg/0.3 mL Soaj injection Commonly known as: EpiPen 2-Pak Inject 0.3 mg into the muscle as needed for anaphylaxis.   feeding supplement Liqd Take 237 mLs by mouth 2 (two) times daily between meals for 60 doses.   guaiFENesin-dextromethorphan 100-10 MG/5ML syrup Commonly known as: ROBITUSSIN DM Take 10 mLs by mouth every 4 (four) hours as needed for cough.   hydrOXYzine 25 MG tablet Commonly known as: ATARAX Take 1 tablet (25 mg total) by mouth 3 (three) times daily as needed for anxiety.   ipratropium-albuterol 0.5-2.5 (3) MG/3ML Soln Commonly known as: DUONEB Take 3 mLs by nebulization every 2 (two) hours as needed. What changed: reasons to take this   lidocaine 5 % Commonly known as: LIDODERM Place 1 patch onto the skin daily. Remove & Discard patch within 12 hours or as directed by MD   loratadine 10 MG tablet Commonly known as: Claritin Take 1 tablet (10 mg total) by mouth daily.   methocarbamol 500 MG tablet Commonly known as: ROBAXIN Take 1 tablet (500 mg total) by mouth every 6 (six) hours as needed for muscle spasms.   montelukast 10 MG tablet Commonly known as: SINGULAIR Take 1 tablet (10 mg total) by mouth at bedtime.   MULTIVITAMIN PO Take 1 tablet by mouth daily.   nicotine 21 mg/24hr patch Commonly known as: NICODERM CQ - dosed in mg/24 hours Place 1 patch (21 mg total) onto the skin daily. Replaces: Nicotine 21-14-7 MG/24HR Kit   ondansetron 4 MG tablet Commonly known as: ZOFRAN TAKE 1 TABLET BY MOUTH ONCE DAILY AS NEEDED FOR NAUSEA FOR VOMITING What changed: See the new instructions.   oxyCODONE 5 MG immediate release  tablet Commonly known as: Oxy IR/ROXICODONE Take 1 tablet (5 mg total) by mouth every 6 (six) hours as needed for breakthrough pain or moderate pain.   pantoprazole 40 MG tablet Commonly known as: PROTONIX Take 1 tablet (40 mg total) by mouth 2 (two) times daily before a meal. What changed: when to take this   predniSONE 20 MG tablet Commonly known as: DELTASONE Take 2 tablets (40 mg total) by mouth daily with breakfast for 2 days, THEN 1.5 tablets (30 mg total) daily with breakfast for 2 days, THEN 1 tablet (20 mg total) daily with breakfast for 2 days, THEN 0.5 tablets (10 mg total) daily with breakfast for 2 days. Start taking on: March 11, 2023   Trelegy Ellipta 200-62.5-25 MCG/ACT Aepb Generic drug: Fluticasone-Umeclidin-Vilant Inhale 2 puffs into the lungs daily.               Durable Medical Equipment  (From admission, onward)           Start     Ordered  03/09/23 0952  For home use only DME oxygen  Once       Question Answer Comment  Length of Need Lifetime   Mode or (Route) Nasal cannula   Liters per Minute 2   Frequency Continuous (stationary and portable oxygen unit needed)   Oxygen delivery system Gas      03/09/23 0951            Follow-up Information     [facility] Follow up.   Why: home oxygen Contact information: [address] [place] [address] [place]        Riesa Pope, MD .   Specialty: Internal Medicine Contact information: [place] [address] [phone]                 Consultations: none  Procedures/Studies:  DG Chest Port 1 View  Result Date: 03/08/2023 CLINICAL DATA:  Respiratory distress EXAM: PORTABLE CHEST 1 VIEW COMPARISON:  03/04/2023 FINDINGS: The heart size and mediastinal contours are within normal limits. Both lungs are clear. The visualized skeletal structures are unremarkable. IMPRESSION: No active disease. Electronically Signed   By: Lucienne Capers M.D.   On:  03/08/2023 03:44   DG Chest Port 1 View  Result Date: 03/04/2023 CLINICAL DATA:  Shortness of breath. EXAM: PORTABLE CHEST 1 VIEW COMPARISON:  Chest radiograph dated 01/30/2023. FINDINGS: No focal consolidation, pleural effusion, or pneumothorax. The cardiac silhouette is within normal limits. No acute osseous pathology. IMPRESSION: No active disease. Electronically Signed   By: Anner Crete M.D.   On: 03/04/2023 00:52    Subjective: - no chest pain, shortness of breath, no abdominal pain, nausea or vomiting.   Discharge Exam: BP (!) 124/91 (BP Location: Left Arm)   Pulse 78   Temp 98.1 F (36.7 C) (Oral)   Resp 20   Ht 5\' 3"  (1.6 m)   Wt 67.8 kg   LMP 11/08/2014   SpO2 99%   BMI 26.48 kg/m   General: Pt is alert, awake, not in acute distress Cardiovascular: RRR, S1/S2 +, no rubs, no gallops Respiratory: CTA bilaterally, no wheezing, no rhonchi Abdominal: Soft, NT, ND, bowel sounds + Extremities: no edema, no cyanosis    The results of significant diagnostics from this hospitalization (including imaging, microbiology, ancillary and laboratory) are listed below for reference.     Microbiology: Recent Results (from the past 240 hour(s))  Resp panel by RT-PCR (RSV, Flu A&B, Covid) Anterior Nasal Swab     Status: None   Collection Time: 03/08/23  3:48 AM   Specimen: Anterior Nasal Swab  Result Value Ref Range Status   SARS Coronavirus 2 by RT PCR NEGATIVE NEGATIVE Final    Comment: (NOTE) SARS-CoV-2 target nucleic acids are NOT DETECTED.  The SARS-CoV-2 RNA is generally detectable in upper respiratory specimens during the acute phase of infection. The lowest concentration of SARS-CoV-2 viral copies this assay can detect is 138 copies/mL. A negative result does not preclude SARS-Cov-2 infection and should not be used as the sole basis for treatment or other patient management decisions. A negative result may occur with  improper specimen collection/handling, submission  of specimen other than nasopharyngeal swab, presence of viral mutation(s) within the areas targeted by this assay, and inadequate number of viral copies(<138 copies/mL). A negative result must be combined with clinical observations, patient history, and epidemiological information. The expected result is Negative.  Fact Sheet for Patients:  EntrepreneurPulse.com.au  Fact Sheet for Healthcare Providers:  IncredibleEmployment.be  This  test is no t yet approved or cleared by the [place] and  has been authorized for detection and/or diagnosis of SARS-CoV-2 by FDA under an Emergency Use Authorization (EUA). This EUA will remain  in effect (meaning this test can be used) for the duration of the COVID-19 declaration under Section 564(b)(1) of the Act, 21 U.S.C.section 360bbb-3(b)(1), unless the authorization is terminated  or revoked sooner.       Influenza A by PCR NEGATIVE NEGATIVE Final   Influenza B by PCR NEGATIVE NEGATIVE Final    Comment: (NOTE) The Xpert Xpress SARS-CoV-2/FLU/RSV plus assay is intended as an aid in the diagnosis of influenza from Nasopharyngeal swab specimens and should not be used as a sole basis for treatment. Nasal washings and aspirates are unacceptable for Xpert Xpress SARS-CoV-2/FLU/RSV testing.  Fact Sheet for Patients: EntrepreneurPulse.com.au  Fact Sheet for Healthcare Providers: IncredibleEmployment.be  This test is not yet approved or cleared by the [place] FDA and has been authorized for detection and/or diagnosis of SARS-CoV-2 by FDA under an Emergency Use Authorization (EUA). This EUA will remain in effect (meaning this test can be used) for the duration of the COVID-19 declaration under Section 564(b)(1) of the Act, 21 U.S.C. section 360bbb-3(b)(1), unless the authorization is terminated or revoked.     Resp Syncytial Virus by PCR NEGATIVE NEGATIVE  Final    Comment: (NOTE) Fact Sheet for Patients: EntrepreneurPulse.com.au  Fact Sheet for Healthcare Providers: IncredibleEmployment.be  This test is not yet approved or cleared by the [place] FDA and has been authorized for detection and/or diagnosis of SARS-CoV-2 by FDA under an Emergency Use Authorization (EUA). This EUA will remain in effect (meaning this test can be used) for the duration of the COVID-19 declaration under Section 564(b)(1) of the Act, 21 U.S.C. section 360bbb-3(b)(1), unless the authorization is terminated or revoked.  Performed at [facility], [place] [address] [place], [place] [address]      Labs: Basic Metabolic Panel: Recent Labs  Lab 03/04/23 0057 03/08/23 0333 03/09/23 0526 03/10/23 0520  NA 138 137 137 137  K 3.1* 4.7 4.0 4.0  CL 105 105 102 101  CO2 22 23 28 30   GLUCOSE 106* 123* 96 83  BUN 12 11 15 17   CREATININE 0.95 0.79 0.94 0.93  CALCIUM 8.9 9.1 8.9 8.8*  MG  --   --   --  2.1   Liver Function Tests: Recent Labs  Lab 03/04/23 0057 03/09/23 0526 03/10/23 0520  AST 27 23 25   ALT 26 28 28   ALKPHOS 76 65 72  BILITOT 0.4 0.3 0.4  PROT 6.8 5.8* 5.8*  ALBUMIN 4.0 3.5 3.4*   CBC: Recent Labs  Lab 03/04/23 0034 03/08/23 0333 03/09/23 0526 03/10/23 0520  WBC 8.6 13.7* 17.1* 13.5*  NEUTROABS 3.9 6.1  --   --   HGB 15.8* 15.2* 13.1 13.9  HCT 48.8* 47.3* 41.1 44.7  MCV 86.7 87.1 88.0 89.6  PLT 342 333 263 262   CBG: No results for input(s): "GLUCAP" in the last 168 hours. Hgb A1c No results for input(s): "HGBA1C" in the last 72 hours. Lipid Profile No results for input(s): "CHOL", "HDL", "LDLCALC", "TRIG", "CHOLHDL", "LDLDIRECT" in the last 72 hours. Thyroid function studies No results for input(s): "TSH", "T4TOTAL", "T3FREE", "THYROIDAB" in the last 72 hours.  Invalid input(s): "FREET3" Urinalysis    Component Value Date/Time   COLORURINE YELLOW 01/31/2023  1443   APPEARANCEUR HAZY (A) 01/31/2023 1443   APPEARANCEUR Clear  10/21/2021 1004   LABSPEC 1.023 01/31/2023 1443   PHURINE 5.0 01/31/2023 1443   GLUCOSEU NEGATIVE 01/31/2023 1443   GLUCOSEU NEG mg/dL 04/09/2008 2037   HGBUR NEGATIVE 01/31/2023 1443   [place] 01/31/2023 1443   BILIRUBINUR Negative 10/21/2021 1004   [place] 01/31/2023 1443   PROTEINUR NEGATIVE 01/31/2023 1443   UROBILINOGEN 0.2 03/11/2015 1323   NITRITE NEGATIVE 01/31/2023 1443   LEUKOCYTESUR LARGE (A) 01/31/2023 1443    FURTHER DISCHARGE INSTRUCTIONS:   Get Medicines reviewed and adjusted: Please take all your medications with you for your next visit with your Primary MD   Laboratory/radiological data: Please request your Primary MD to go over all hospital tests and procedure/radiological results at the follow up, please ask your Primary MD to get all Hospital records sent to his/her office.   In some cases, they will be blood work, cultures and biopsy results pending at the time of your discharge. Please request that your primary care M.D. goes through all the records of your hospital data and follows up on these results.   Also Note the following: If you experience worsening of your admission symptoms, develop shortness of breath, life threatening emergency, suicidal or homicidal thoughts you must seek medical attention immediately by calling 911 or calling your MD immediately  if symptoms less severe.   You must read complete instructions/literature along with all the possible adverse reactions/side effects for all the Medicines you take and that have been prescribed to you. Take any new Medicines after you have completely understood and accpet all the possible adverse reactions/side effects.    Do not drive when taking Pain medications or sleeping medications (Benzodaizepines)   Do not take more than prescribed Pain, Sleep and Anxiety Medications. It is not advisable to combine  anxiety,sleep and pain medications without talking with your primary care practitioner   Special Instructions: If you have smoked or chewed Tobacco  in the last 2 yrs please stop smoking, stop any regular Alcohol  and or any Recreational drug use.   Wear Seat belts while driving.   Please note: You were cared for by a hospitalist during your hospital stay. Once you are discharged, your primary care physician will handle any further medical issues. Please note that NO REFILLS for any discharge medications will be authorized once you are discharged, as it is imperative that you return to your primary care physician (or establish a relationship with a primary care physician if you do not have one) for your post hospital discharge needs so that they can reassess your need for medications and monitor your lab values.  Time coordinating discharge: 35 minutes  SIGNED:  Marzetta Board, MD, PhD 03/10/2023, 8:43 AM

## 2023-03-10 NOTE — Discharge Instructions (Signed)
Follow with Riesa Pope, MD in 5-7 days  Please get a complete blood count and chemistry panel checked by your Primary MD at your next visit, and again as instructed by your Primary MD. Please get your medications reviewed and adjusted by your Primary MD.  Please request your Primary MD to go over all Hospital Tests and Procedure/Radiological results at the follow up, please get all Hospital records sent to your Prim MD by signing hospital release before you go home.  In some cases, there will be blood work, cultures and biopsy results pending at the time of your discharge. Please request that your primary care M.D. goes through all the records of your hospital data and follows up on these results.  If you had Pneumonia of Lung problems at the Hospital: Please get a 2 view Chest X ray done in 6-8 weeks after hospital discharge or sooner if instructed by your Primary MD.  If you have Congestive Heart Failure: Please call your Cardiologist or Primary MD anytime you have any of the following symptoms:  1) 3 pound weight gain in 24 hours or 5 pounds in 1 week  2) shortness of breath, with or without a dry hacking cough  3) swelling in the hands, feet or stomach  4) if you have to sleep on extra pillows at night in order to breathe  Follow cardiac low salt diet and 1.5 lit/day fluid restriction.  If you have diabetes Accuchecks 4 times/day, Once in AM empty stomach and then before each meal. Log in all results and show them to your primary doctor at your next visit. If any glucose reading is under 80 or above 300 call your primary MD immediately.  If you have Seizure/Convulsions/Epilepsy: Please do not drive, operate heavy machinery, participate in activities at heights or participate in high speed sports until you have seen by Primary MD or a Neurologist and advised to do so again. Per [facility] statutes, patients with seizures are not allowed to drive until they have been  seizure-free for six months.  Use caution when using heavy equipment or power tools. Avoid working on ladders or at heights. Take showers instead of baths. Ensure the water temperature is not too high on the home water heater. Do not go swimming alone. Do not lock yourself in a room alone (i.e. bathroom). When caring for infants or small children, sit down when holding, feeding, or changing them to minimize risk of injury to the child in the event you have a seizure. Maintain good sleep hygiene. Avoid alcohol.   If you had Gastrointestinal Bleeding: Please ask your Primary MD to check a complete blood count within one week of discharge or at your next visit. Your endoscopic/colonoscopic biopsies that are pending at the time of discharge, will also need to followed by your Primary MD.  Get Medicines reviewed and adjusted. Please take all your medications with you for your next visit with your Primary MD  Please request your Primary MD to go over all hospital tests and procedure/radiological results at the follow up, please ask your Primary MD to get all Hospital records sent to his/her office.  If you experience worsening of your admission symptoms, develop shortness of breath, life threatening emergency, suicidal or homicidal thoughts you must seek medical attention immediately by calling 911 or calling your MD immediately  if symptoms less severe.  You must read complete instructions/literature along with all the possible adverse reactions/side effects for all the Medicines you take  and that have been prescribed to you. Take any new Medicines after you have completely understood and accpet all the possible adverse reactions/side effects.   Do not drive or operate heavy machinery when taking Pain medications.   Do not take more than prescribed Pain, Sleep and Anxiety Medications  Special Instructions: If you have smoked or chewed Tobacco  in the last 2 yrs please stop smoking, stop any regular  Alcohol  and or any Recreational drug use.  Wear Seat belts while driving.  Please note You were cared for by a hospitalist during your hospital stay. If you have any questions about your discharge medications or the care you received while you were in the hospital after you are discharged, you can call the unit and asked to speak with the hospitalist on call if the hospitalist that took care of you is not available. Once you are discharged, your primary care physician will handle any further medical issues. Please note that NO REFILLS for any discharge medications will be authorized once you are discharged, as it is imperative that you return to your primary care physician (or establish a relationship with a primary care physician if you do not have one) for your aftercare needs so that they can reassess your need for medications and monitor your lab values.  You can reach the hospitalist office at phone [phone] or fax [phone]   If you do not have a primary care physician, you can call [phone] for a physician referral.  Activity: As tolerated with Full fall precautions use walker/cane & assistance as needed    Diet: regular  Disposition Home

## 2023-03-11 ENCOUNTER — Telehealth: Payer: Self-pay

## 2023-03-11 NOTE — Transitions of Care (Post Inpatient/ED Visit) (Signed)
   03/11/2023  Name: Kathleen [name] MRN: [id number] DOB: Jan 25, 1961  Today's TOC FU Call Status: Today's TOC FU Call Status:: Unsuccessul Call (1st Attempt) Unsuccessful Call (1st Attempt) Date: 03/11/23  Attempted to reach the patient regarding the most recent Inpatient/ED visit.  Follow Up Plan: Additional outreach attempts will be made to reach the patient to complete the Transitions of Care (Post Inpatient/ED visit) call.   Johnney Killian, RN, BSN, CCM Care Management Coordinator [place] Phone: [phone]: [phone]

## 2023-03-14 ENCOUNTER — Other Ambulatory Visit: Payer: Self-pay | Admitting: Nurse Practitioner

## 2023-03-14 ENCOUNTER — Telehealth: Payer: Self-pay

## 2023-03-14 MED ORDER — OXYCODONE-ACETAMINOPHEN 5-325 MG PO TABS: 1.0000 | ORAL_TABLET | Freq: Four times a day (QID) | ORAL | 0 refills | Status: DC | PRN

## 2023-03-14 NOTE — Progress Notes (Signed)
Oxycodone not covered by Medicaid- Endocet 5-325 ordered instead

## 2023-03-14 NOTE — Transitions of Care (Post Inpatient/ED Visit) (Signed)
   03/14/2023  Name: Kathleen [name] MRN: [id number] DOB: Jul 08, 1961  Today's TOC FU Call Status: Today's TOC FU Call Status:: Unsuccessful Call (2nd Attempt) Unsuccessful Call (2nd Attempt) Date: 03/14/23  Attempted to reach the patient regarding the most recent Inpatient/ED visit.  Follow Up Plan: Additional outreach attempts will be made to reach the patient to complete the Transitions of Care (Post Inpatient/ED visit) call.   Johnney Killian, RN, BSN, CCM Care Management Coordinator [place] Phone: [phone]: [phone]

## 2023-03-15 ENCOUNTER — Telehealth: Payer: Self-pay

## 2023-03-15 NOTE — Transitions of Care (Post Inpatient/ED Visit) (Signed)
   03/15/2023  Name: Kathleen [name] MRN: [id number] DOB: [birth year]  Today's TOC FU Call Status: Today's TOC FU Call Status:: Successful TOC FU Call Competed TOC FU Call Complete Date: 03/15/23  Transition Care Management Follow-up Telephone Call Date of Discharge: 03/10/23 Discharge Facility: [facility]) Type of Discharge: Inpatient Admission Primary Inpatient Discharge Diagnosis:: Acute Respiratory Failure with Hypoxia and Hypercapnea How have you been since you were released from the hospital?: Better Any questions or concerns?: No  Items Reviewed: Did you receive and understand the discharge instructions provided?: Yes Medications obtained and verified?: Yes (Medications Reviewed) Any new allergies since your discharge?: No Dietary orders reviewed?: Yes Type of Diet Ordered:: Regular Do you have support at home?: Yes People in Home: child(ren), adult Name of Support/Comfort Primary Source: [facility] and Equipment/Supplies: [place] Ordered?: No Any new equipment or medical supplies ordered?: No  Functional Questionnaire: Do you need assistance with bathing/showering or dressing?: No Do you need assistance with meal preparation?: No Do you need assistance with eating?: No Do you have difficulty maintaining continence: No Do you need assistance with getting out of bed/getting out of a chair/moving?: No Do you have difficulty managing or taking your medications?: No  Follow up appointments reviewed: PCP Follow-up appointment confirmed?: Yes Date of PCP follow-up appointment?: 03/24/23 Follow-up Provider: [facility] Follow-up appointment confirmed?: NA Do you need transportation to your follow-up appointment?: No Do you understand care options if your condition(s) worsen?: Yes-patient verbalized understanding  SDOH Interventions Today    Flowsheet Row Most Recent Value  SDOH Interventions   Food Insecurity Interventions  Intervention Not Indicated  Transportation Interventions Intervention Not Indicated      Johnney Killian, RN, BSN, CCM Care Management Coordinator [facility] Phone: [phone]: [phone]

## 2023-03-24 ENCOUNTER — Ambulatory Visit (INDEPENDENT_AMBULATORY_CARE_PROVIDER_SITE_OTHER): Payer: Medicaid Other

## 2023-03-24 DIAGNOSIS — J441 Chronic obstructive pulmonary disease with (acute) exacerbation: Secondary | ICD-10-CM

## 2023-03-24 NOTE — Progress Notes (Signed)
Scheduled for telehealth visit. Called 3 times. Went straight to voicemail each time (which has not been set up).

## 2023-03-24 NOTE — Assessment & Plan Note (Signed)
Patient with recent admission for acute hypoxic respiratory failure, underlying asthma/COPD with exacerbation. Patient was placed on steroids, antibiotics, nebulizers with improvement in her respiratory status.  Wheezing resolved and able to ambulate on discharge (though with desat) and went home with oxygen. Felt to be chronically hypoxic.  Sent home on a few days of antibiotics, steroid taper, as well as limited pain medications for her chest pain in the setting of coughing spells. Currently prescribed anora ellipta, flovent, albuterol, singulair, and duonebs.

## 2023-04-07 ENCOUNTER — Encounter (HOSPITAL_COMMUNITY): Payer: Self-pay

## 2023-04-07 ENCOUNTER — Observation Stay (HOSPITAL_COMMUNITY)
Admission: AD | Admit: 2023-04-07 | Discharge: 2023-04-08 | Disposition: A | Discharge status: Home / Self Care | Payer: 59 | Source: Ambulatory Visit | Attending: Internal Medicine | Admitting: Internal Medicine

## 2023-04-07 ENCOUNTER — Ambulatory Visit (INDEPENDENT_AMBULATORY_CARE_PROVIDER_SITE_OTHER): Payer: Medicaid Other

## 2023-04-07 ENCOUNTER — Other Ambulatory Visit: Payer: Self-pay

## 2023-04-07 ENCOUNTER — Observation Stay (HOSPITAL_COMMUNITY): Payer: 59

## 2023-04-07 VITALS — BP 140/94 | HR 82 | Temp 98.1°F | Resp 28 | Ht 63.0 in | Wt 120.4 lb

## 2023-04-07 DIAGNOSIS — J449 Chronic obstructive pulmonary disease, unspecified: Secondary | ICD-10-CM | POA: Diagnosis present

## 2023-04-07 DIAGNOSIS — J441 Chronic obstructive pulmonary disease with (acute) exacerbation: Secondary | ICD-10-CM | POA: Diagnosis not present

## 2023-04-07 DIAGNOSIS — Z7982 Long term (current) use of aspirin: Secondary | ICD-10-CM | POA: Diagnosis not present

## 2023-04-07 DIAGNOSIS — Z9981 Dependence on supplemental oxygen: Secondary | ICD-10-CM | POA: Insufficient documentation

## 2023-04-07 DIAGNOSIS — F17210 Nicotine dependence, cigarettes, uncomplicated: Secondary | ICD-10-CM | POA: Diagnosis not present

## 2023-04-07 DIAGNOSIS — J45901 Unspecified asthma with (acute) exacerbation: Principal | ICD-10-CM | POA: Insufficient documentation

## 2023-04-07 DIAGNOSIS — J4489 Other specified chronic obstructive pulmonary disease: Secondary | ICD-10-CM

## 2023-04-07 DIAGNOSIS — M5416 Radiculopathy, lumbar region: Secondary | ICD-10-CM | POA: Insufficient documentation

## 2023-04-07 DIAGNOSIS — R55 Syncope and collapse: Secondary | ICD-10-CM

## 2023-04-07 DIAGNOSIS — F411 Generalized anxiety disorder: Secondary | ICD-10-CM | POA: Diagnosis not present

## 2023-04-07 DIAGNOSIS — Z79899 Other long term (current) drug therapy: Secondary | ICD-10-CM | POA: Insufficient documentation

## 2023-04-07 DIAGNOSIS — J9601 Acute respiratory failure with hypoxia: Secondary | ICD-10-CM

## 2023-04-07 DIAGNOSIS — Z8673 Personal history of transient ischemic attack (TIA), and cerebral infarction without residual deficits: Secondary | ICD-10-CM | POA: Diagnosis not present

## 2023-04-07 DIAGNOSIS — R0602 Shortness of breath: Secondary | ICD-10-CM | POA: Diagnosis present

## 2023-04-07 HISTORY — DX: Unspecified asthma with (acute) exacerbation: J45.901

## 2023-04-07 LAB — COMPREHENSIVE METABOLIC PANEL
ALT: 32 U/L (ref 0–44)
AST: 30 U/L (ref 15–41)
Albumin: 3.2 g/dL — ABNORMAL LOW (ref 3.5–5.0)
Alkaline Phosphatase: 79 U/L (ref 38–126)
Anion gap: 7 (ref 5–15)
BUN: 9 mg/dL (ref 8–23)
CO2: 27 mmol/L (ref 22–32)
Calcium: 8.5 mg/dL — ABNORMAL LOW (ref 8.9–10.3)
Chloride: 106 mmol/L (ref 98–111)
Creatinine, Ser: 1 mg/dL (ref 0.44–1.00)
GFR, Estimated: >60 mL/min (ref >60)
Glucose, Bld: 82 mg/dL (ref 70–99)
Potassium: 3.2 mmol/L — ABNORMAL LOW (ref 3.5–5.1)
Sodium: 140 mmol/L (ref 135–145)
Total Bilirubin: 0.6 mg/dL (ref 0.3–1.2)
Total Protein: 5.7 g/dL — ABNORMAL LOW (ref 6.5–8.1)

## 2023-04-07 LAB — CBC
HCT: 41.3 % (ref 36.0–46.0)
Hemoglobin: 13.6 g/dL (ref 12.0–15.0)
MCH: 28.6 pg (ref 26.0–34.0)
MCHC: 32.9 g/dL (ref 30.0–36.0)
MCV: 86.8 fL (ref 80.0–100.0)
Platelets: 294 10*3/uL (ref 150–400)
RBC: 4.76 MIL/uL (ref 3.87–5.11)
RDW: 13.6 % (ref 11.5–15.5)
WBC: 9.2 10*3/uL (ref 4.0–10.5)
nRBC: 0 % (ref 0.0–0.2)

## 2023-04-07 MED ORDER — ASPIRIN 81 MG PO TBEC
81.0000 mg | DELAYED_RELEASE_TABLET | Freq: Every day | ORAL | Status: DC
  Administered 2023-04-07 – 2023-04-08 (×2): 81 mg via ORAL
  Filled 2023-04-07 (×2): qty 1

## 2023-04-07 MED ORDER — MELATONIN 3 MG PO TABS
3.0000 mg | ORAL_TABLET | Freq: Every evening | ORAL | Status: DC | PRN
  Administered 2023-04-07: 3 mg via ORAL
  Filled 2023-04-07: qty 1

## 2023-04-07 MED ORDER — IPRATROPIUM-ALBUTEROL 0.5-2.5 (3) MG/3ML IN SOLN
3.0000 mL | Freq: Once | RESPIRATORY_TRACT | Status: AC
  Administered 2023-04-07: 3 mL via RESPIRATORY_TRACT

## 2023-04-07 MED ORDER — ADULT MULTIVITAMIN W/MINERALS CH
1.0000 | ORAL_TABLET | Freq: Every day | ORAL | Status: DC
  Administered 2023-04-07 – 2023-04-08 (×2): 1 via ORAL
  Filled 2023-04-07 (×2): qty 1

## 2023-04-07 MED ORDER — NICOTINE 21 MG/24HR TD PT24
21.0000 mg | MEDICATED_PATCH | Freq: Every day | TRANSDERMAL | Status: DC
  Administered 2023-04-07 – 2023-04-08 (×2): 21 mg via TRANSDERMAL
  Filled 2023-04-07 (×2): qty 1

## 2023-04-07 MED ORDER — ACETAMINOPHEN 325 MG PO TABS
650.0000 mg | ORAL_TABLET | Freq: Four times a day (QID) | ORAL | Status: DC | PRN
  Administered 2023-04-07 – 2023-04-08 (×3): 650 mg via ORAL
  Filled 2023-04-07 (×3): qty 2

## 2023-04-07 MED ORDER — LIDOCAINE 5 % EX PTCH
1.0000 | MEDICATED_PATCH | CUTANEOUS | Status: DC
  Administered 2023-04-07: 1 via TRANSDERMAL
  Filled 2023-04-07: qty 1

## 2023-04-07 MED ORDER — OXYCODONE HCL 5 MG PO TABS
5.0000 mg | ORAL_TABLET | Freq: Four times a day (QID) | ORAL | Status: DC | PRN
  Administered 2023-04-07 – 2023-04-08 (×3): 5 mg via ORAL
  Filled 2023-04-07 (×3): qty 1

## 2023-04-07 MED ORDER — HYDROXYZINE HCL 25 MG PO TABS
25.0000 mg | ORAL_TABLET | Freq: Three times a day (TID) | ORAL | Status: DC | PRN
  Administered 2023-04-07 – 2023-04-08 (×2): 25 mg via ORAL
  Filled 2023-04-07 (×2): qty 1

## 2023-04-07 MED ORDER — BENZONATATE 100 MG PO CAPS
100.0000 mg | ORAL_CAPSULE | Freq: Four times a day (QID) | ORAL | Status: DC | PRN
  Administered 2023-04-07: 100 mg via ORAL
  Filled 2023-04-07: qty 1

## 2023-04-07 MED ORDER — IPRATROPIUM-ALBUTEROL 0.5-2.5 (3) MG/3ML IN SOLN
3.0000 mL | RESPIRATORY_TRACT | Status: DC
  Administered 2023-04-07 – 2023-04-08 (×4): 3 mL via RESPIRATORY_TRACT
  Filled 2023-04-07 (×4): qty 3

## 2023-04-07 MED ORDER — ENSURE ENLIVE PO LIQD
237.0000 mL | Freq: Two times a day (BID) | ORAL | Status: DC
  Administered 2023-04-08 (×2): 237 mL via ORAL

## 2023-04-07 MED ORDER — HEPARIN SODIUM (PORCINE) 5000 UNIT/ML IJ SOLN
5000.0000 [IU] | Freq: Three times a day (TID) | INTRAMUSCULAR | Status: DC
  Administered 2023-04-07 – 2023-04-08 (×3): 5000 [IU] via SUBCUTANEOUS
  Filled 2023-04-07 (×3): qty 1

## 2023-04-07 MED ORDER — MONTELUKAST SODIUM 10 MG PO TABS
10.0000 mg | ORAL_TABLET | Freq: Every day | ORAL | Status: DC
  Administered 2023-04-07: 10 mg via ORAL
  Filled 2023-04-07: qty 1

## 2023-04-07 MED ORDER — PREDNISONE 20 MG PO TABS
40.0000 mg | ORAL_TABLET | Freq: Every day | ORAL | Status: DC
  Administered 2023-04-08: 40 mg via ORAL
  Filled 2023-04-07: qty 2

## 2023-04-07 MED ORDER — ORAL CARE MOUTH RINSE: 15.0000 mL | OROMUCOSAL | Status: DC | PRN

## 2023-04-07 MED ORDER — PANTOPRAZOLE SODIUM 40 MG PO TBEC
40.0000 mg | DELAYED_RELEASE_TABLET | Freq: Every day | ORAL | Status: DC
  Administered 2023-04-07 – 2023-04-08 (×2): 40 mg via ORAL
  Filled 2023-04-07 (×2): qty 1

## 2023-04-07 MED ORDER — METHYLPREDNISOLONE SODIUM SUCC 125 MG IJ SOLR
125.0000 mg | Freq: Once | INTRAMUSCULAR | Status: AC
  Administered 2023-04-07: 125 mg via INTRAVENOUS
  Filled 2023-04-07: qty 2

## 2023-04-07 MED ORDER — POTASSIUM CHLORIDE CRYS ER 20 MEQ PO TBCR
40.0000 meq | EXTENDED_RELEASE_TABLET | Freq: Once | ORAL | Status: AC
  Administered 2023-04-07: 40 meq via ORAL
  Filled 2023-04-07: qty 2
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
## 2023-04-07 NOTE — Progress Notes (Signed)
Report called to Kathleen Deist, RN on 2 [facility].  Patient transported via Wheelchair to [facility] 22.  Oxygen 3 Liters.  Patients O2 93 %.
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
## 2023-04-08 ENCOUNTER — Other Ambulatory Visit (HOSPITAL_COMMUNITY): Payer: Self-pay

## 2023-04-08 DIAGNOSIS — J45901 Unspecified asthma with (acute) exacerbation: Secondary | ICD-10-CM | POA: Diagnosis not present

## 2023-04-08 DIAGNOSIS — F17210 Nicotine dependence, cigarettes, uncomplicated: Secondary | ICD-10-CM | POA: Diagnosis not present

## 2023-04-08 LAB — CBC
HCT: 41.2 % (ref 36.0–46.0)
Hemoglobin: 13.5 g/dL (ref 12.0–15.0)
MCH: 28.4 pg (ref 26.0–34.0)
MCHC: 32.8 g/dL (ref 30.0–36.0)
MCV: 86.6 fL (ref 80.0–100.0)
Platelets: 266 10*3/uL (ref 150–400)
RBC: 4.76 MIL/uL (ref 3.87–5.11)
RDW: 13.3 % (ref 11.5–15.5)
WBC: 7 10*3/uL (ref 4.0–10.5)
nRBC: 0 % (ref 0.0–0.2)

## 2023-04-08 LAB — BASIC METABOLIC PANEL
Anion gap: 8 (ref 5–15)
BUN: 15 mg/dL (ref 8–23)
CO2: 26 mmol/L (ref 22–32)
Calcium: 9 mg/dL (ref 8.9–10.3)
Chloride: 103 mmol/L (ref 98–111)
Creatinine, Ser: 0.92 mg/dL (ref 0.44–1.00)
GFR, Estimated: >60 mL/min (ref >60)
Glucose, Bld: 112 mg/dL — ABNORMAL HIGH (ref 70–99)
Potassium: 4.6 mmol/L (ref 3.5–5.1)
Sodium: 137 mmol/L (ref 135–145)

## 2023-04-08 LAB — MAGNESIUM: Magnesium: 2.3 mg/dL (ref 1.7–2.4)

## 2023-04-08 MED ORDER — HYDROXYZINE HCL 25 MG PO TABS
25.0000 mg | ORAL_TABLET | Freq: Three times a day (TID) | ORAL | 0 refills | Status: DC | PRN
  Filled 2023-04-08: qty 30, 10d supply, fill #0

## 2023-04-08 MED ORDER — POTASSIUM CHLORIDE 20 MEQ PO PACK: 40.0000 meq | PACK | Freq: Two times a day (BID) | ORAL | Status: DC

## 2023-04-08 MED ORDER — ONDANSETRON HCL 4 MG PO TABS
4.0000 mg | ORAL_TABLET | Freq: Three times a day (TID) | ORAL | Status: DC | PRN
  Administered 2023-04-08: 4 mg via ORAL
  Filled 2023-04-08: qty 1

## 2023-04-08 MED ORDER — ALBUTEROL SULFATE HFA 108 (90 BASE) MCG/ACT IN AERS
2.0000 | INHALATION_SPRAY | Freq: Four times a day (QID) | RESPIRATORY_TRACT | 2 refills | Status: DC | PRN
  Filled 2023-04-08: qty 6.7, 25d supply, fill #0

## 2023-04-08 MED ORDER — HYDROXYZINE HCL 25 MG PO TABS: 25.0000 mg | ORAL_TABLET | Freq: Every day | ORAL | Status: DC

## 2023-04-08 MED ORDER — TRELEGY ELLIPTA 200-62.5-25 MCG/ACT IN AEPB
1.0000 | INHALATION_SPRAY | Freq: Every day | RESPIRATORY_TRACT | 0 refills | Status: DC
  Filled 2023-04-08: qty 60, 30d supply, fill #0

## 2023-04-08 MED ORDER — ALBUTEROL SULFATE (2.5 MG/3ML) 0.083% IN NEBU
2.5000 mg | INHALATION_SOLUTION | RESPIRATORY_TRACT | 6 refills | Status: DC | PRN
  Filled 2023-04-08: qty 90, 5d supply, fill #0

## 2023-04-08 MED ORDER — PREDNISONE 20 MG PO TABS
40.0000 mg | ORAL_TABLET | Freq: Every day | ORAL | 0 refills | Status: DC
  Filled 2023-04-08: qty 7, 3d supply, fill #0

## 2023-04-08 MED ORDER — HYDROXYZINE HCL 25 MG PO TABS: 50.0000 mg | ORAL_TABLET | Freq: Every day | ORAL | Status: DC
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
## 2023-04-08 NOTE — TOC Initial Note (Signed)
Transition of Care [facility]) - Initial/Assessment Note    Patient Details  Name: Kathleen [name] MRN: [id number] Date of Birth: October 06, 1961  Transition of Care [facility]) CM/SW Contact:    Janae Bridgeman, RN Phone Number: 04/08/2023, 2:58 PM  Clinical Narrative:                 Cm met with the patient at the bedside to discuss TOC needs.  The patient lives with her son at the home and plans to return home.  Outpatient referral was placed for Outpatient Rehabilitation services for Vestibular Rehabilitation evaluation.  I left a secure detailed message at the clinic and asked that they follow up with the patient to schedule an appointment.  DME at the home includes home oxygen and 3L/min [place], RW and nebulizer machine.  The patient's son lives with the patient and is able to provide transportation.  The patient can be discharged home with family by car.  Expected Discharge Plan: OP Rehab Barriers to Discharge: No Barriers Identified   Patient Goals and CMS Choice Patient states their goals for this hospitalization and ongoing recovery are:: To return home CMS Medicare.gov Compare Post Acute Care list provided to:: Patient Choice offered to / list presented to : Patient [place] ownership interest in [facility].provided to:: Patient    Expected Discharge Plan and Services   Discharge Planning Services: CM Consult Post Acute Care Choice: Resumption of Svcs/PTA Provider Living arrangements for the past 2 months: Single Family Home Expected Discharge Date: 04/08/23                                    Prior Living Arrangements/Services Living arrangements for the past 2 months: Single Family Home Lives with:: Adult Children (Lives with son, [name]) Patient language and need for interpreter reviewed:: Yes Do you feel safe going back to the place where you live?: Yes      Need for Family Participation in Patient Care: Yes (Comment) Care giver support system in  place?: Yes (comment) Current home services: DME Criminal Activity/Legal Involvement Pertinent to Current Situation/Hospitalization: No - Comment as needed  Activities of Daily Living Home Assistive Devices/Equipment: Eyeglasses, Shower chair without back, Grab bars in shower, Grab bars around toilet ADL Screening (condition at time of admission) Patient's cognitive ability adequate to safely complete daily activities?: Yes Is the patient deaf or have difficulty hearing?: No Does the patient have difficulty seeing, even when wearing glasses/contacts?: No Does the patient have difficulty concentrating, remembering, or making decisions?: No Patient able to express need for assistance with ADLs?: No Does the patient have difficulty dressing or bathing?: No Independently performs ADLs?: Yes (appropriate for developmental age) Does the patient have difficulty walking or climbing stairs?: Yes Weakness of Legs: Left Weakness of Arms/Hands: None  Permission Sought/Granted Permission sought to share information with : Case Manager, Family Supports       Permission granted to share info w AGENCY: OP Referral placed for Neuro OP Rehabilitation for Vestibular Rehabilitation  Permission granted to share info w Relationship: son     Emotional Assessment Appearance:: Appears stated age Attitude/Demeanor/Rapport: Gracious Affect (typically observed): Accepting Orientation: : Oriented to Self, Oriented to Place, Oriented to  Time, Oriented to Situation Alcohol / Substance Use: Not Applicable Psych Involvement: No (comment)  Admission diagnosis:  Asthma exacerbation [J45.901] Patient Active Problem List   Diagnosis Date Noted   Asthma exacerbation  04/07/2023   Acute respiratory failure with hypoxia and hypercapnia (HCC) 03/08/2023   Chronic back pain 03/08/2023   OSA (obstructive sleep apnea) 03/08/2023   Acute respiratory failure with hypoxia (HCC) 03/08/2023   COPD exacerbation (HCC)  01/30/2023   Syncope and collapse 01/30/2023   Malnutrition of moderate degree 11/17/2022   Marijuana abuse 08/17/2022   Weight loss 07/07/2022   Sialoadenitis 03/16/2022   Skin growth 08/28/2021   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   Daytime somnolence 04/06/2019   Lumbar radiculopathy 07/26/2016   Depression 09/19/2015   Preventative health care 06/18/2014   Tobacco abuse 03/13/2013   GERD 01/21/2010   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 [place] [address] Phone: [phone] Fax: [phone]  [facility] [address] [place] [address] Phone: [phone] Fax: [phone]     Social Determinants of Health (SDOH) Social History: SDOH Screenings   Food Insecurity: No Food Insecurity (03/15/2023)  Housing: Medium Risk (03/08/2023)  Transportation Needs: No Transportation Needs (03/15/2023)  Utilities: Not At Risk (03/08/2023)  Depression (PHQ2-9): High Risk (04/07/2023)  Tobacco Use: High Risk (04/07/2023)   SDOH Interventions:     Readmission Risk Interventions    04/08/2023    2:51 PM 03/08/2023    4:05 PM 02/01/2023    1:33 PM  Readmission Risk Prevention Plan  Transportation Screening Complete Complete Complete  PCP or Specialist Appt within 3-5 Days  Complete   HRI or Home Care Consult  Complete   Social Work Consult for Recovery Care Planning/Counseling  Complete   Palliative Care Screening  Not Applicable   Medication Review Oceanographer) Complete Complete Complete  PCP or Specialist appointment within 3-5 days of discharge Complete    HRI or Home Care Consult Complete  Complete  SW Recovery Care/Counseling Consult Complete  Complete  Palliative Care Screening Not Applicable  Not Applicable  Skilled  Nursing Facility Not Applicable  Not Applicable
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
## 2023-04-08 NOTE — TOC Transition Note (Signed)
Transition of Care [facility]) - CM/SW Discharge Note   Patient Details  Name: Kathleen [name] MRN: [id number] Date of Birth: Apr 05, 1961  Transition [facility] Care [facility]) CM/SW Contact:  Londell Noll A [place], Theresia Majors Phone Number: 04/08/2023, 4:25 PM   Clinical Narrative:     CSW met with pt at beside and provided therapy resources. She said she was ready to return home. Stated her son would be provided transportation.   Patient will DC to: home  Anticipated DC date: 04/08/23  Family notified: Iverson Alamin  Transport by: family, pt's son      Per MD patient ready for DC to home . RN, patient, patient's family, and facility notified of DC.     CSW will sign off for now as social work intervention is no longer needed. Please consult [place] again if new needs arise.   Final next level of care: Home/Self Care Barriers to Discharge: No Barriers Identified   Patient Goals and CMS Choice CMS Medicare.gov Compare Post Acute Care list provided to:: Patient Choice offered to / list presented to : Patient  Discharge Placement                  Patient to be transferred to facility by: Pt's son Name of family member notified: Saleah Rishel Patient and family notified of of transfer: 04/08/23  Discharge Plan and Services Additional resources added to the After Visit Summary for     Discharge Planning Services: CM Consult Post Acute Care Choice: Resumption of Svcs/PTA Provider                               Social Determinants of Health (SDOH) Interventions SDOH Screenings   Food Insecurity: No Food Insecurity (03/15/2023)  Housing: Medium Risk (03/08/2023)  Transportation Needs: No Transportation Needs (03/15/2023)  Utilities: Not At Risk (03/08/2023)  Depression (PHQ2-9): High Risk (04/07/2023)  Tobacco Use: High Risk (04/07/2023)     Readmission Risk Interventions    04/08/2023    2:51 PM 03/08/2023    4:05 PM 02/01/2023    1:33 PM  Readmission Risk Prevention Plan  Transportation  Screening Complete Complete Complete  PCP or Specialist Appt within 3-5 Days  Complete   HRI or Home Care Consult  Complete   Social Work Consult for Recovery Care Planning/Counseling  Complete   Palliative Care Screening  Not Applicable   Medication Review Oceanographer) Complete Complete Complete  PCP or Specialist appointment within 3-5 days of discharge Complete    HRI or Home Care Consult Complete  Complete  SW Recovery Care/Counseling Consult Complete  Complete  Palliative Care Screening Not Applicable  Not Applicable  Skilled Nursing Facility Not Applicable  Not Applicable

## 2023-04-08 NOTE — Evaluation (Signed)
Physical Therapy Evaluation Patient Details Name: Kathleen [name] MRN: [id number] DOB: 1961/10/12 Today's Date: 04/08/2023  History of Present Illness  62 year old female  who presents as a direct admit from the clinic 04/07/23 for COPD/Asthma Exacerbation . PMH: chronic hypoxic respiratory failure on 3L of oxygen, asthma, chronic low back pain, GAD, nightmares, L LE weakness and pain along lateral aspect of hip down lateral aspect of leg with multiple falls following attack bipolar 1 disorder, and polysubstance abuse  Clinical Impression  Pt with RT on entry and RT reports pt near fall with ambulation to bathroom, per RT pt able to catch herself on sink in room. Pt reports she lives with her son in single story home with ramped entrance. Pt reports independence with mobility and driving, as well as ADLs and iADL, however also reports numerous falls, visual loss on L with driving. Pt is limited in safe mobility by L LE radicular weakness and numbness, in presence of decreased safety and underlying dizziness with positional change. Pt is mod I for bed mobility and transfers and min guard for ambulation with RW. PT performed Vestibular screen with dizziness with head turns and finger follow, recommend Vestibular exam as well as continued PT at discharge. PT will continue to follow acutely.       Recommendations for follow up therapy are one component of a multi-disciplinary discharge planning process, led by the attending physician.  Recommendations may be updated based on patient status, additional functional criteria and insurance authorization.     Assistance Recommended at Discharge Intermittent Supervision/Assistance     Equipment Recommendations None recommended by PT  Recommendations for Other Services  Other (comment) (Vestibular consult)    Functional Status Assessment Patient has had a recent decline in their functional status and demonstrates the ability to make significant improvements in  function in a reasonable and predictable amount of time.     Precautions / Restrictions Precautions Precautions: Fall Precaution Comments: per RT near fall on way to bathroom just prior to PT entering the room Restrictions Weight Bearing Restrictions: No      Mobility  Bed Mobility Overal bed mobility: Modified Independent             General bed mobility comments: HoB elevated    Transfers Overall transfer level: Modified independent Equipment used: Rolling walker (2 wheels)               General transfer comment: good power up and steadying at [employer]    Ambulation/Gait Ambulation/Gait assistance: Min guard Gait Distance (Feet): 20 Feet Assistive device: Rolling walker (2 wheels) Gait Pattern/deviations: Step-through pattern, Decreased step length - right, Decreased step length - left, Decreased weight shift to left Gait velocity: slowed Gait velocity interpretation: 1.31 - 2.62 ft/sec, indicative of limited community ambulator   General Gait Details: min guard for safety, dizziness with turning to L at the door        Balance Overall balance assessment: Mild deficits observed, not formally tested                                           Pertinent Vitals/Pain Pain Assessment Pain Assessment: Faces Pain Score: 8  Pain Location: L hip and LE Pain Descriptors / Indicators: Radiating, Pins and needles Pain Intervention(s): Limited activity within patient's tolerance, Monitored during session, Repositioned    Home Living Family/patient expects to  be discharged to:: Private residence Living Arrangements: Children Available Help at Discharge: Family;Available 24 hours/day Type of Home: House Home Access: Ramped entrance       Home Layout: One level Home Equipment: Grab bars - tub/shower;Shower Counsellor (2 wheels);Cane - single point;BSC/3in1;Rollator (4 wheels)      Prior Function Prior Level of Function :  Independent/Modified Independent;Driving             Mobility Comments: community ambulation no AD; Reports that shortness of breath has been primary deterrent to increased activity. ADLs Comments: Independent ADLs and IADLs        Extremity/Trunk Assessment   Upper Extremity Assessment Upper Extremity Assessment: Overall WFL for tasks assessed    Lower Extremity Assessment Lower Extremity Assessment: LLE deficits/detail LLE Deficits / Details: ROM WFL, strength initially 4/5 but breaks with sustained pressure, muscle mass to L LE visibly less than R LE LLE Sensation: decreased light touch;decreased proprioception LLE Coordination: decreased fine motor    Cervical / Trunk Assessment Cervical / Trunk Assessment: Normal  Communication   Communication: No difficulties  Cognition Arousal/Alertness: Awake/alert Behavior During Therapy: Impulsive, Restless Overall Cognitive Status: Impaired/Different from baseline Area of Impairment: Safety/judgement                         Safety/Judgement: Decreased awareness of safety, Decreased awareness of deficits     General Comments: reports driving even though her eyesight periodically goes out on her L eye, falls secondary to L LE weakness and numbness        General Comments General comments (skin integrity, edema, etc.): Pt reports dizziness with positional change, however BP stable, Vestibular screen positive to nausea and dizziness with finger follow and head turns, possible beat of nystagmus with head turn to R        Assessment/Plan    PT Assessment Patient needs continued PT services  PT Problem List Decreased activity tolerance;Decreased mobility;Impaired sensation;Decreased safety awareness       PT Treatment Interventions DME instruction;Gait training;Functional mobility training;Therapeutic activities;Therapeutic exercise;Balance training;Cognitive remediation;Patient/family education    PT Goals  (Current goals can be found in the Care Plan section)  Acute Rehab PT Goals Patient Stated Goal: have less L LE weakness PT Goal Formulation: With patient Time For Goal Achievement: 04/22/23 Potential to Achieve Goals: Fair    Frequency Min 3X/week        AM-PAC PT "6 Clicks" Mobility  Outcome Measure Help needed turning from your back to your side while in a flat bed without using bedrails?: None Help needed moving from lying on your back to sitting on the side of a flat bed without using bedrails?: None Help needed moving to and from a bed to a chair (including a wheelchair)?: A Little Help needed standing up from a chair using your arms (e.g., wheelchair or bedside chair)?: A Little Help needed to walk in hospital room?: A Little Help needed climbing 3-5 steps with a railing? : A Lot 6 Click Score: 19    End of Session Equipment Utilized During Treatment: Gait belt Activity Tolerance: Patient tolerated treatment well;Treatment limited secondary to medical complications (Comment) (dizziness) Patient left: in chair;with call bell/phone within reach;with chair alarm set Nurse Communication: Mobility status;Other (comment) (vestibular exam consult) PT Visit Diagnosis: Muscle weakness (generalized) (M62.81);Other symptoms and signs involving the nervous system (R29.898);Unsteadiness on feet (R26.81)    Time: 1610-9604 PT Time Calculation (min) (ACUTE ONLY): 31 min   Charges:  PT Evaluation $PT Eval Moderate Complexity: 1 Mod PT Treatments $Therapeutic Activity: 8-22 mins        Eloy Fehl B. Beverely Risen PT, DPT Acute Rehabilitation Services Please use secure chat or  Call Office [phone]   Elon Alas [facility] 04/08/2023, 12:57 PM
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
## 2023-04-08 NOTE — Discharge Instructions (Addendum)
Kathleen [name], it was a pleasure taking care of you while you were in the hospital. You were brought in for likely an Asthma exacerbation that may have been caused by you running out of your inhaler. I am sending another inhaler bedside for you. You also have four more days of steroids to complete (one for today, and then twice a day).   I am going to reach out to the clinic to help make you an appointment. I do think you could do well with seeing an outpatient psych doctor.   From the psychiatry team: Please come to [facility] during walk in hours to establish care for medication management or therapy:   Address:  [address] in [place], [address] Ph: [phone]   New patient medication management hours: Monday-Friday from 8 AM to 11 AM Please show up by 7:00 AM to ensure you are seen  New patient therapy walk in hours: Mondays, Wednesdays, and Thursdays from 7:30 AM to 4 PM Please show up by 7:20 AM to ensure you are seen

## 2023-04-11 ENCOUNTER — Other Ambulatory Visit (HOSPITAL_COMMUNITY): Payer: Self-pay

## 2023-04-11 ENCOUNTER — Telehealth: Payer: Self-pay

## 2023-04-11 NOTE — Transitions of Care (Post Inpatient/ED Visit) (Unsigned)
   04/11/2023  Name: Kathleen [name] MRN: [id number] DOB: 07/04/1961  Today's TOC FU Call Status: Today's TOC FU Call Status:: Unsuccessul Call (1st Attempt) Unsuccessful Call (1st Attempt) Date: 04/11/23  Attempted to reach the patient regarding the most recent Inpatient/ED visit.  Follow Up Plan: Additional outreach attempts will be made to reach the patient to complete the Transitions of Care (Post Inpatient/ED visit) call.   Signature Karena Addison, LPN [facility] Nurse Health Advisor Direct Dial [phone]

## 2023-04-13 ENCOUNTER — Telehealth: Payer: Self-pay

## 2023-04-13 NOTE — Telephone Encounter (Signed)
Requesting a letter for court, please call pt back.

## 2023-04-13 NOTE — Transitions of Care (Post Inpatient/ED Visit) (Signed)
   04/13/2023  Name: Kathleen [name] MRN: [id number] DOB: Oct 17, 1961  Today's TOC FU Call Status: Today's TOC FU Call Status:: Unsuccessful Call (3rd Attempt) Unsuccessful Call (1st Attempt) Date: 04/11/23 Unsuccessful Call (2nd Attempt) Date: 04/12/23 Unsuccessful Call (3rd Attempt) Date: 04/13/23  Attempted to reach the patient regarding the most recent Inpatient/ED visit.  Follow Up Plan: No further outreach attempts will be made at this time. We have been unable to contact the patient.  Signature Karena Addison, LPN [facility] Nurse Health Advisor Direct Dial [phone]

## 2023-04-14 NOTE — Telephone Encounter (Signed)
Pt rtn your call about her Letter for Court that is needed by 04/20/2023.  Pt was unable to see her phone to answer it yesterday because she was having her eyes dilated at the time.  Please call the patient back again.

## 2023-04-19 ENCOUNTER — Encounter: Payer: Self-pay | Admitting: Physical Therapy

## 2023-04-19 ENCOUNTER — Ambulatory Visit: Payer: 59 | Attending: Internal Medicine | Admitting: Physical Therapy

## 2023-04-19 VITALS — BP 114/90 | HR 90

## 2023-04-19 DIAGNOSIS — R269 Unspecified abnormalities of gait and mobility: Secondary | ICD-10-CM | POA: Diagnosis present

## 2023-04-19 DIAGNOSIS — R2681 Unsteadiness on feet: Secondary | ICD-10-CM | POA: Diagnosis present

## 2023-04-19 DIAGNOSIS — R296 Repeated falls: Secondary | ICD-10-CM | POA: Insufficient documentation

## 2023-04-19 DIAGNOSIS — R55 Syncope and collapse: Secondary | ICD-10-CM | POA: Insufficient documentation

## 2023-04-19 DIAGNOSIS — R42 Dizziness and giddiness: Secondary | ICD-10-CM | POA: Diagnosis present

## 2023-04-19 DIAGNOSIS — M25552 Pain in left hip: Secondary | ICD-10-CM | POA: Insufficient documentation

## 2023-04-19 DIAGNOSIS — J9601 Acute respiratory failure with hypoxia: Secondary | ICD-10-CM | POA: Insufficient documentation
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
## 2023-04-20 NOTE — Progress Notes (Signed)
Internal Medicine Clinic Attending  Case discussed with Dr. Sridharan  at the time of the visit.  We reviewed the resident's history and exam and pertinent patient test results.  I agree with the assessment, diagnosis, and plan of care documented in the resident's note.  

## 2023-04-21 ENCOUNTER — Encounter: Payer: Self-pay | Admitting: Physical Therapy

## 2023-04-26 ENCOUNTER — Ambulatory Visit: Payer: 59 | Admitting: Physical Therapy

## 2023-04-26 ENCOUNTER — Telehealth: Payer: Self-pay | Admitting: Physical Therapy

## 2023-04-26 NOTE — Telephone Encounter (Signed)
Called and LVM regarding missed PT visit; reminded patient that this is her first of 3 towards no-show/cancellation policy. Reminded of upcoming appointment.  Maryruth Eve, PT, DPT

## 2023-04-27 ENCOUNTER — Encounter: Payer: Medicaid Other | Admitting: Student

## 2023-04-29 ENCOUNTER — Encounter: Payer: Self-pay | Admitting: Physical Therapy

## 2023-04-29 ENCOUNTER — Other Ambulatory Visit: Payer: Self-pay | Admitting: Internal Medicine

## 2023-04-29 ENCOUNTER — Ambulatory Visit: Payer: 59 | Admitting: Physical Therapy

## 2023-04-29 VITALS — BP 109/89 | HR 104

## 2023-04-29 DIAGNOSIS — R269 Unspecified abnormalities of gait and mobility: Secondary | ICD-10-CM | POA: Diagnosis not present

## 2023-04-29 DIAGNOSIS — R296 Repeated falls: Secondary | ICD-10-CM

## 2023-04-29 DIAGNOSIS — M25552 Pain in left hip: Secondary | ICD-10-CM

## 2023-04-29 DIAGNOSIS — R42 Dizziness and giddiness: Secondary | ICD-10-CM

## 2023-04-29 DIAGNOSIS — R2681 Unsteadiness on feet: Secondary | ICD-10-CM
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
## 2023-05-06 ENCOUNTER — Ambulatory Visit: Payer: 59 | Admitting: Physical Therapy

## 2023-05-10 ENCOUNTER — Encounter: Payer: Self-pay | Admitting: Physical Therapy

## 2023-05-10 ENCOUNTER — Ambulatory Visit: Payer: 59 | Admitting: Physical Therapy

## 2023-05-10 VITALS — HR 86

## 2023-05-10 DIAGNOSIS — R269 Unspecified abnormalities of gait and mobility: Secondary | ICD-10-CM | POA: Diagnosis not present

## 2023-05-10 DIAGNOSIS — R2681 Unsteadiness on feet: Secondary | ICD-10-CM

## 2023-05-10 DIAGNOSIS — R296 Repeated falls: Secondary | ICD-10-CM

## 2023-05-10 DIAGNOSIS — R42 Dizziness and giddiness: Secondary | ICD-10-CM

## 2023-05-10 NOTE — Patient Instructions (Addendum)
Head Motion: Up and Down    Sitting, slowly move head up with eyes open. Hold position until symptoms subside. Then, move head in opposite direction. Hold position until symptoms subside. Perform 2 sets of 5 reps. Wait for dizziness to go away.  Do __2__ sessions per day.  Head Motion: Side to Side    Sitting, tilt head down 15-30, slowly move head to right with eyes open. Hold position until symptoms subside. Then, move head slowly to opposite side. Hold position until symptoms subside. Perform 2 sets of 5 reps. Wait for dizziness to go away.  Do __2__ sessions per day.  Copyright  VHI. All rights reserved.

## 2023-05-10 NOTE — Therapy (Signed)
OUTPATIENT PHYSICAL THERAPY VESTIBULAR TREATMENT   Patient Name: Kathleen [name] MRN: [id number] DOB:[birth year], 62 y.o., female Today's Date: 05/10/2023  END OF SESSION:  PT End of Session - 05/10/23 1449     Visit Number 3    Number of Visits 13    Date for PT Re-Evaluation 06/16/23    Authorization Type Medicaid    PT Start Time 1447    PT Stop Time 1527    PT Time Calculation (min) 40 min    Equipment Utilized During Treatment --    Activity Tolerance --   pt limited by dizziness/fatigue   Behavior During Therapy WFL for tasks assessed/performed            Past Medical History:  Diagnosis Date   Anxiety    Arthritis    hands   Asthma    2 sets of PFT's in 04/09 without sign variability. Last set with significant decrease in FEV1 with saline alone, suggesting Asthma but  recommended clinical corelation    Asthma    Bipolar 1 disorder [facility])    therapist is Joy and is followed by [employer] [facility]   Blackout    negative work up including ESR, ANA, opthalmology referral, carotid dopplers, 2D echo, MRI and EEG.   BREAST LUMP 03/25/2008   Annotation: bilaterally Qualifier: Diagnosis of  By: Candis Musa MD, Ruben Reason.    Breast mass in female    s/p mammogram, u/s and biopsy in 05/09 c/w fibroadenoma,   Bronchitis    Chronic headache    Chronic low back pain 08/08/2008   Qualifier: Diagnosis of  By: Andrey Campanile  MD, Valerie     Chronic pain    normal work up including TSH, RPR, B12, HIV, plain films, 2 ESR's, ANA, CK, RF along with routine CBC, CMET and UA. Further work up includes CRP, ESR, SPEP/UPEP, hepatitis erology, A1C , repeat ANA   COPD (chronic obstructive pulmonary disease) (HCC)    COPD exacerbation (HCC) 03/31/2019   Depression    DUB (dysfunctional uterine bleeding)    and pelvic pain, with negative endometrial bx in 07/09 and transvaginal U/S significant for mild fibroids in 0/09.   Emphysema of lung (HCC)    GERD (gastroesophageal reflux disease)     Hallucinations 03/18/2008   Qualifier: Diagnosis of  By: Andrey Campanile  MD, Vikki Ports     Hyperlipidemia    Lower extremity edema    Neg ABI's, normal 2D echo, normal albumin   Menorrhagia    Ovarian cyst    Polysubstance abuse (HCC)    none since March 17,2009.   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   Tubular adenoma of colon    Past Surgical History:  Procedure Laterality Date   CESAREAN SECTION     CESAREAN SECTION     COLONOSCOPY     HERNIA REPAIR     Left partial oophorectomy     OOPHORECTOMY     1/2 ovary removed   POLYPECTOMY     VAGINA SURGERY     mesh   Patient Active Problem List   Diagnosis Date Noted   Asthma exacerbation 04/07/2023   Acute respiratory failure with hypoxia and hypercapnia (HCC) 03/08/2023   Chronic back pain 03/08/2023   OSA (obstructive sleep apnea) 03/08/2023   Acute respiratory failure with hypoxia (HCC) 03/08/2023   COPD exacerbation (HCC) 01/30/2023   Syncope and collapse  01/30/2023   Malnutrition of moderate degree 11/17/2022   Marijuana abuse 08/17/2022   Weight loss 07/07/2022   Sialoadenitis 03/16/2022   Skin growth 08/28/2021   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   Daytime somnolence 04/06/2019   Lumbar radiculopathy 07/26/2016   Depression 09/19/2015   Preventative health care 06/18/2014   Tobacco abuse 03/13/2013   GERD 01/21/2010   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 Will feel lightheaded. Reports neck is also really stiff and body feels sore - as if she has gotten hit by a truck. Is not on any new medication. Reports dizziness has been going on since February. Has blackouts where she can't see anything for a few seconds. Has had 4-5 falls since she was last here. Reports her falls have happened when bending over or falling of the bed. Pt brought her O2 with her today.   Pt accompanied by: self  PERTINENT HISTORY: asthma, COPD, anxiety,  bipolar 1, hx marijuana abuse, copd with frequent readmissions presents to ed with acute sob and wheezing   PAIN:  Are you having pain? Yes: NPRS scale: 10/10 Pain location: back and L leg, and L arm Pain description: pounding and sharp Aggravating factors: loud noise Relieving factors: pain medication/peace and quiet  Vitals:   05/10/23 1454  Pulse: 86  SpO2: 98%     PRECAUTIONS: Fall - asthma (patient brings inhaler with her); pulmonologist put patient on 3L of oxygen at baseline - patient arrived without during session but SPO2 holding steady at 93-98% (advised bringing for all future sessions)  WEIGHT BEARING RESTRICTIONS: No  FALLS: Has patient fallen in last 6 months? Yes. 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 No intracranial abnormality. 2. Sinus mucosal thickening.  COGNITION: Overall cognitive status: Impaired - reports decreased short term memory    VESTIBULAR TREATMENT:                                                                                                    TherAct:  POSITIONAL TESTING: Right Roll Test: no nystagmus and pt reporting feeling dizzy  and very fearful going into position, feels like she is going to fall.  Right Sidelying: no nystagmus and pt reporting feeling like she was falling when going into position and 10/10 dizziness with return to upright Left Sidelying: no nystagmus and pt reporting feeling like she was spinning and very dizzy   Pt agreeable to assess positional testing today for BPPV (pt had it in the hospital). Pt very fearful going into each position and taking incr time. Pt agreeable to let PT touch her head today to allow into gentle head motion for testing.   Pt reports driving herself to PT today and did not wish to do any further positional testing today after R roll test. Wants to finish assessing later this week if she feels better.   NMR:  Attempted seated bending habituation tasks, but pt requesting not to do this today as she does not want to fall on her head.   Seated Saccades: Horizontal direction: 2 x 10 reps, mild dizziness  Vertical direction: 2 x 10 reps, mild dizziness   Habituation: Head turns 2 x 5 reps, mild dizziness Head nods 2 x 5 reps, mild dizziness   Seated rest break between each exercise. Pt tired today and needing to close her eyes during session.   PATIENT EDUCATION: Education details: Findings in today's session, initial HEP for saccades, habituation to head motions and purpose of habituation exercises.  Person educated: Patient Education method: Explanation, Demonstration, Verbal cues, and Handouts Education comprehension: verbalized understanding, returned demonstration, and needs further education  HOME EXERCISE PROGRAM: Seated head turns, nods 2 x 5 reps Access Code: [email or web address] URL: [email or web address] Date: 05/10/2023 Prepared by: Sherlie Ban  Exercises - Seated Horizontal Saccades  - 2 x daily - 7 x weekly - 2 sets - 10 reps - Seated Vertical Saccades  - 2 x daily - 7 x weekly - 2 sets - 10 reps  GOALS: Goals reviewed with patient? Yes  SHORT TERM GOALS: Target date: 05/12/2023  Patient will demonstrate 100% compliance with initial HEP to continue to progress between physical therapy sessions.   Baseline: To be provided Goal status: INITIAL  2.  Patient will improve [place] Post Concussion Symptoms Questionnaire RPQ-3 score to highest reported symptom severity as 3/4 points to indicate reduced severity of post-concussive symptoms to progress towards PLOF.   Baseline: 4/4 Goal status: INITIAL  3.  Therapist will assess mCTSIB and write goal as indicated to target patient's baseline balance challenges. Baseline: To be assessed Goal status: INITIAL  4.  Therapist will assess MSQ and write goal as indicated to determine patient's baseline motion sensitivity. Baseline: Assessed on 04/29/2023 Goal status: MET  LONG TERM GOALS: Target date: 06/02/2023  Patient will report demonstrate independence with final HEP in order to maintain current gains and continue to progress after physical therapy discharge.   Baseline: To be assessed Goal status: INITIAL  2.  Patient will improve [place] Post Concussion Symptoms Questionnaire RPQ-3 and RPQ-13 score to highest reported symptom severity as 2/4 points to indicate reduced severity of post-concussive symptoms to progress towards PLOF.   Baseline: 4/4 Goal status: INITIAL  3.  mCTSIB to be assessed/goal written as indicated Baseline: To be assessed Goal status: INITIAL  4.  Patient will score < 2 on components of MSQ indicate reduction in motion sensitivity in order to progress towards baseline function.  Baseline: greatest 4/5 Goal status: INITIAL  ASSESSMENT:  CLINICAL IMPRESSION:   Pt reporting feeling dizzy today and was nauseous earlier,  but was still  willing to come to therapy. Pt brought in her O2 today to session, which was WNL for therapy. Attempted to perform further vestibular assessment with positional testing (pt provided consent for therapist to gently turn head for positions). 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 Pt reporting incr dizziness with sidelying positions and felt like she was falling. Pt also felt like she was falling when rolling to the R (rolling to the side of the wall). No nystagmus noted throughout. Pt deferred to do further positional testing and wants to finish in next session. Remainder of session focused on initiating HEP for seated saccades and head motions for habituation. Pt reporting mild dizziness with each exercise. Will continue to progress towards LTGs.    OBJECTIVE IMPAIRMENTS: Abnormal gait, cardiopulmonary status limiting activity, decreased activity tolerance, decreased balance, decreased cognition, decreased endurance, decreased knowledge of use of DME, decreased mobility, difficulty walking, decreased safety awareness, dizziness, and pain.   ACTIVITY LIMITATIONS: carrying, lifting, bending, standing, transfers, and locomotion level  PARTICIPATION LIMITATIONS: community activity and yard work  PERSONAL FACTORS: Past/current experiences, Social background, and 3+ comorbidities: see above  are also affecting patient's functional outcome.   REHAB POTENTIAL: Fair may be complicated by complex recent history and significance of distress around recent events  CLINICAL DECISION MAKING: Unstable/unpredictable  EVALUATION COMPLEXITY: High   PLAN:  PT FREQUENCY: 2x/week  PT DURATION: 6 weeks  PLANNED INTERVENTIONS: Therapeutic exercises, Therapeutic activity, Neuromuscular re-education, Balance training, Gait training, Patient/Family education, Self Care, Joint mobilization, Vestibular training, Canalith repositioning, DME instructions, Dry Needling, Manual therapy, and  Re-evaluation  PLAN FOR NEXT SESSION: mCTSIB and write goals as indicated, follow up about neuro psych/counseling, possibly may benefit from OT or speech pending on additional findings, Finish positional testing. Work on habituation tasks as able.   Drake Leach, PT, DPT 05/10/2023, 3:29 PM   Check all possible CPT codes: 40981 - PT Re-evaluation, 97110- Therapeutic Exercise, [phone]- Neuro Re-education, [phone] - Gait Training, [phone] - Manual Therapy, [phone] - Therapeutic Activities, and [phone] - Self Care    Check all conditions that are expected to impact treatment: {Conditions expected to impact treatment:Respiratory disorders, Cognitive Impairment or Intellectual disability, Musculoskeletal disorders, Neurological condition and/or seizures, and Psychological or psychiatric disorders   If treatment provided at initial evaluation, no treatment charged due to lack of authorization.

## 2023-05-13 ENCOUNTER — Ambulatory Visit: Payer: 59 | Admitting: Physical Therapy

## 2023-05-16 ENCOUNTER — Encounter: Payer: Self-pay | Admitting: Student

## 2023-05-17 ENCOUNTER — Encounter: Payer: Self-pay | Admitting: Physical Therapy

## 2023-05-17 ENCOUNTER — Ambulatory Visit: Payer: 59 | Attending: Internal Medicine | Admitting: Physical Therapy

## 2023-05-17 VITALS — HR 76

## 2023-05-17 DIAGNOSIS — M25552 Pain in left hip: Secondary | ICD-10-CM | POA: Insufficient documentation

## 2023-05-17 DIAGNOSIS — R42 Dizziness and giddiness: Secondary | ICD-10-CM | POA: Insufficient documentation

## 2023-05-17 DIAGNOSIS — R269 Unspecified abnormalities of gait and mobility: Secondary | ICD-10-CM | POA: Diagnosis present

## 2023-05-17 DIAGNOSIS — G8929 Other chronic pain: Secondary | ICD-10-CM | POA: Insufficient documentation

## 2023-05-17 DIAGNOSIS — R296 Repeated falls: Secondary | ICD-10-CM | POA: Insufficient documentation

## 2023-05-17 DIAGNOSIS — M6281 Muscle weakness (generalized): Secondary | ICD-10-CM | POA: Diagnosis present

## 2023-05-17 DIAGNOSIS — M545 Low back pain, unspecified: Secondary | ICD-10-CM | POA: Insufficient documentation

## 2023-05-17 DIAGNOSIS — R2681 Unsteadiness on feet: Secondary | ICD-10-CM | POA: Diagnosis present

## 2023-05-17 NOTE — Therapy (Signed)
OUTPATIENT PHYSICAL THERAPY VESTIBULAR TREATMENT   Patient Name: Kathleen [name] MRN: [id number] DOB:20-Sep-1961, 62 y.o., female Today's Date: 05/17/2023  END OF SESSION:  PT End of Session - 05/17/23 1321     Visit Number 4    Number of Visits 13    Date for PT Re-Evaluation 06/16/23    Authorization Type Medicaid    PT Start Time 1319    PT Stop Time 1400    PT Time Calculation (min) 41 min    Activity Tolerance Patient tolerated treatment well   pt limited by dizziness   Behavior During Therapy [facility] [place] for tasks assessed/performed;Anxious             Past Medical History:  Diagnosis Date   Anxiety    Arthritis    hands   Asthma    2 sets of PFT's in 04/09 without sign variability. Last set with significant decrease in FEV1 with saline alone, suggesting Asthma but  recommended clinical corelation    Asthma    Bipolar 1 disorder [facility])    therapist is Joy and is followed by [employer] [facility]   Blackout    negative work up including ESR, ANA, opthalmology referral, carotid dopplers, 2D echo, MRI and EEG.   BREAST LUMP 03/25/2008   Annotation: bilaterally Qualifier: Diagnosis of  By: Candis Musa MD, Ruben Reason.    Breast mass in female    s/p mammogram, u/s and biopsy in 05/09 c/w fibroadenoma,   Bronchitis    Chronic headache    Chronic low back pain 08/08/2008   Qualifier: Diagnosis of  By: Andrey Campanile  MD, Valerie     Chronic pain    normal work up including TSH, RPR, B12, HIV, plain films, 2 ESR's, ANA, CK, RF along with routine CBC, CMET and UA. Further work up includes CRP, ESR, SPEP/UPEP, hepatitis erology, A1C , repeat ANA   COPD (chronic obstructive pulmonary disease) (HCC)    COPD exacerbation (HCC) 03/31/2019   Depression    DUB (dysfunctional uterine bleeding)    and pelvic pain, with negative endometrial bx in 07/09 and transvaginal U/S significant for mild fibroids in 0/09.   Emphysema of lung (HCC)    GERD (gastroesophageal reflux disease)    Hallucinations  03/18/2008   Qualifier: Diagnosis of  By: Andrey Campanile  MD, Vikki Ports     Hyperlipidemia    Lower extremity edema    Neg ABI's, normal 2D echo, normal albumin   Menorrhagia    Ovarian cyst    Polysubstance abuse (HCC)    none since March 17,2009.   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   Tubular adenoma of colon    Past Surgical History:  Procedure Laterality Date   CESAREAN SECTION     CESAREAN SECTION     COLONOSCOPY     HERNIA REPAIR     Left partial oophorectomy     OOPHORECTOMY     1/2 ovary removed   POLYPECTOMY     VAGINA SURGERY     mesh   Patient Active Problem List   Diagnosis Date Noted   Asthma exacerbation 04/07/2023   Acute respiratory failure with hypoxia and hypercapnia (HCC) 03/08/2023   Chronic back pain 03/08/2023   OSA (obstructive sleep apnea) 03/08/2023   Acute respiratory failure with hypoxia (HCC) 03/08/2023   COPD exacerbation (HCC) 01/30/2023   Syncope and collapse 01/30/2023   Malnutrition  of moderate degree 11/17/2022   Marijuana abuse 08/17/2022   Weight loss 07/07/2022   Sialoadenitis 03/16/2022   Skin growth 08/28/2021   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   Daytime somnolence 04/06/2019   Lumbar radiculopathy 07/26/2016   Depression 09/19/2015   Preventative health care 06/18/2014   Tobacco abuse 03/13/2013   GERD 01/21/2010   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 Reports today is one of her good days. Still getting dizzy when bending down or looking up. Has been trying her exercises at home. Had a fall yesterday, got off balance going down the stairs. Is ok   Pt accompanied by: self  PERTINENT HISTORY: asthma, COPD, anxiety,  bipolar 1, hx marijuana abuse, copd with frequent readmissions presents to ed with acute sob and wheezing   PAIN:  Are you having pain? Yes: NPRS scale: 10/10 Pain location: back and L leg, and L arm Pain description: pounding and sharp Aggravating factors: loud noise Relieving factors: pain medication/peace and quiet  Reports having pain 24/7.   Vitals:   05/17/23 1324  Pulse: 76  SpO2: 97%      PRECAUTIONS: Fall - asthma (patient brings inhaler with her); pulmonologist put patient on 3L of oxygen at baseline - patient arrived without during session but SPO2 holding steady at 93-98% (advised bringing for all future sessions)  WEIGHT BEARING RESTRICTIONS: No  FALLS: Has patient fallen in last 6 months? Yes. 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 No intracranial abnormality. 2. Sinus mucosal thickening.  COGNITION: Overall cognitive status: Impaired - reports decreased short term memory   VESTIBULAR TREATMENT:                                                                                                    TherAct:  POSITIONAL TESTING: Right Dix-Hallpike: no nystagmus Left Dix-Hallpike: no nystagmus and  pt very fearful/anxious about laying back into position, made it halfway back into position and then had to sit back up due to feeling dizzy. When pt able to lay fully back into position, no nystagmus or dizziness noted  Right Roll Test:  no nystagmus and pt reporting feeling dizzy and needing to grab out onto the mat table  Left Roll Test: no nystagmus and pt reporting feeling dizzy and immediately going to grab out onto mat table for balance   Pt agreeable to finish positional testing today. Pt with hx of BPPV from the hospital.     M-CTSIB  Condition 1: Firm Surface, EO 30 Sec, Mild Sway  Condition 2: Firm Surface, EC 11.8 Sec, Mod/Severe Sway  Condition 3: Foam Surface, EO 30 Sec, Mod Sway  Condition 4: Foam Surface, EC 5.1 Sec Severe Sway     NMR: With feet slightly apart on level ground and EC 3 x 30 seconds, incr postural sway, but pt able to maintain balance  On air ex with feet hip width EO 2 x 5 reps head turns, 2 x 5 reps head nods. Cues to just move head   Seated habituation to bending with 5 cones on the floor, performed x2 sets. Mild dizziness. Pt initially with hesitation about bending over to the floor, but did improve with incr reps.  Pt reporting SOB after the 2nd set, O2 assessed at 98% and HR at 71 bpm. Cues for pursed lip breathing with pt reporting that this also made her feel dizzy.   PATIENT EDUCATION: Education details: Discussed negative positional testing, findings from mCTSIB, balance additions to HEP  Person educated: Patient Education method: Explanation, Demonstration, Verbal cues, and Handouts Education comprehension: verbalized understanding, returned demonstration, and needs further education  HOME EXERCISE PROGRAM: Seated head turns, nods 2 x 5 reps Access Code: [email or web address] URL: [email or web address] Date: 05/17/2023 Prepared by: Sherlie Ban  Exercises - Seated Horizontal Saccades  - 2 x daily - 7 x weekly - 2 sets - 10 reps - Seated  Vertical Saccades  - 2 x daily - 7 x weekly - 2 sets - 10 reps - Standing Balance with Eyes Closed  - 1 x daily - 7 x weekly - 3 sets - 30 hold - Standing with Head Rotation  - 1 x daily - 7 x weekly - 2 sets - 5 reps  GOALS: Goals reviewed with patient? Yes  SHORT TERM GOALS: Target date: 05/12/2023  Patient will demonstrate 100% compliance with initial HEP to continue to progress between physical therapy sessions.   Baseline: To be provided Goal status: INITIAL  2.  Patient will improve [place] Post Concussion Symptoms Questionnaire RPQ-3 score to highest reported symptom severity as 3/4 points to indicate reduced severity of post-concussive symptoms to progress towards PLOF.   Baseline: 4/4 Goal status: INITIAL  3.  Therapist will assess mCTSIB and write goal as indicated to target patient's baseline balance challenges. Baseline: MET  Goal status: MET  4.  Therapist will assess MSQ and write goal as indicated to determine patient's baseline motion sensitivity. Baseline: Assessed on 04/29/2023 Goal status: MET  LONG TERM GOALS: Target date: 06/02/2023  Patient will report demonstrate independence with final HEP in order to maintain current gains and continue to progress after physical therapy discharge.   Baseline: To be assessed Goal status: INITIAL  2.  Patient will improve [place] Post Concussion Symptoms Questionnaire RPQ-3 and RPQ-13 score to highest reported symptom severity as 2/4 points to indicate reduced severity of post-concussive symptoms to progress towards PLOF.   Baseline: 4/4 Goal status: INITIAL  3.  Pt will improve condition 2 of mCTSIB to at least 20 seconds and condition 4 to at least 10 seconds in order to demo improved balance with  vision removed.  Baseline: condition 2: 11.8 seconds, condition 4: 5.1 seconds  Goal status: INITIAL  4.  Patient will score < 2 on components of MSQ indicate reduction in motion sensitivity in order to progress towards  baseline function.  Baseline: greatest 4/5 Goal status: INITIAL  ASSESSMENT:  CLINICAL IMPRESSION:   Today's skilled session focused on finishing assessment of positional testing (unable to complete it at previous session). Pt negative for positional testing with Gilberto Better and roll test. Pt reporting dizziness with rolling, but no nystagmus observed. Pt very hesitant about coming back in L DixHallpike due to dizziness, but no nystagmus was noted and pt not dizzy in position. Assessed mCTSIB with pt demonstrating incr reliance of vision for balance and decr sensory integration. LTG updated as appropriate. Remainder of session focused on adding balance tasks to HEP and habituation exercises to bending. Pt initially very hesistant to bending as she felt like she might fall. Pt able to complete task and reported mild dizziness and got SOB afterwards. Pt's O2 WFL at 98%. Will continue to progress towards LTGs.    OBJECTIVE IMPAIRMENTS: Abnormal gait, cardiopulmonary status limiting activity, decreased activity tolerance, decreased balance, decreased cognition, decreased endurance, decreased knowledge of use of DME, decreased mobility, difficulty walking, decreased safety awareness, dizziness, and pain.   ACTIVITY LIMITATIONS: carrying, lifting, bending, standing, transfers, and locomotion level  PARTICIPATION LIMITATIONS: community activity and yard work  PERSONAL FACTORS: Past/current experiences, Social background, and 3+ comorbidities: see above  are also affecting patient's functional outcome.   REHAB POTENTIAL: Fair may be complicated by complex recent history and significance of distress around recent events  CLINICAL DECISION MAKING: Unstable/unpredictable  EVALUATION COMPLEXITY: High   PLAN:  PT FREQUENCY: 2x/week  PT DURATION: 6 weeks  PLANNED INTERVENTIONS: Therapeutic exercises, Therapeutic activity, Neuromuscular re-education, Balance training, Gait training, Patient/Family  education, Self Care, Joint mobilization, Vestibular training, Canalith repositioning, DME instructions, Dry Needling, Manual therapy, and Re-evaluation  PLAN FOR NEXT SESSION: check last STG. follow up about neuro psych/counseling, possibly may benefit from OT or speech pending on additional findings. Work on habituation tasks as able. VOR tasks, balance with vision removed, unlevel surfaces.   Drake Leach, PT, DPT 05/17/2023, 2:08 PM   Check all possible CPT codes: 16109 - PT Re-evaluation, 97110- Therapeutic Exercise, [phone]- Neuro Re-education, [phone] - Gait Training, [phone] - Manual Therapy, [phone] - Therapeutic Activities, and [phone] - Self Care    Check all conditions that are expected to impact treatment: {Conditions expected to impact treatment:Respiratory disorders, Cognitive Impairment or Intellectual disability, Musculoskeletal disorders, Neurological condition and/or seizures, and Psychological or psychiatric disorders   If treatment provided at initial evaluation, no treatment charged due to lack of authorization.

## 2023-05-19 ENCOUNTER — Ambulatory Visit (INDEPENDENT_AMBULATORY_CARE_PROVIDER_SITE_OTHER): Payer: 59 | Admitting: Student

## 2023-05-19 ENCOUNTER — Other Ambulatory Visit: Payer: Self-pay

## 2023-05-19 ENCOUNTER — Encounter: Payer: Self-pay | Admitting: Student

## 2023-05-19 VITALS — BP 118/71 | HR 84 | Temp 98.3°F | Ht 63.0 in | Wt 116.1 lb

## 2023-05-19 DIAGNOSIS — F4310 Post-traumatic stress disorder, unspecified: Secondary | ICD-10-CM | POA: Diagnosis not present

## 2023-05-19 DIAGNOSIS — M5416 Radiculopathy, lumbar region: Secondary | ICD-10-CM | POA: Diagnosis not present

## 2023-05-19 DIAGNOSIS — F411 Generalized anxiety disorder: Secondary | ICD-10-CM

## 2023-05-19 DIAGNOSIS — M5412 Radiculopathy, cervical region: Secondary | ICD-10-CM | POA: Diagnosis not present

## 2023-05-19 DIAGNOSIS — J4489 Other specified chronic obstructive pulmonary disease: Secondary | ICD-10-CM | POA: Diagnosis not present

## 2023-05-19 DIAGNOSIS — Z87891 Personal history of nicotine dependence: Secondary | ICD-10-CM

## 2023-05-19 DIAGNOSIS — R32 Unspecified urinary incontinence: Secondary | ICD-10-CM | POA: Diagnosis not present

## 2023-05-19 DIAGNOSIS — Z0000 Encounter for general adult medical examination without abnormal findings: Secondary | ICD-10-CM

## 2023-05-19 MED ORDER — ONDANSETRON HCL 4 MG PO TABS
ORAL_TABLET | ORAL | 0 refills | Status: DC
Start: 2023-05-19 — End: 2024-06-07

## 2023-05-19 MED ORDER — TRELEGY ELLIPTA 200-62.5-25 MCG/ACT IN AEPB: 1.0000 | INHALATION_SPRAY | Freq: Every day | RESPIRATORY_TRACT | 0 refills | Status: DC

## 2023-05-19 MED ORDER — HYDROXYZINE HCL 25 MG PO TABS: 25.0000 mg | ORAL_TABLET | Freq: Three times a day (TID) | ORAL | 0 refills | Status: DC | PRN

## 2023-05-19 MED ORDER — IPRATROPIUM-ALBUTEROL 0.5-2.5 (3) MG/3ML IN SOLN: 3.0000 mL | RESPIRATORY_TRACT | 1 refills | Status: DC | PRN

## 2023-05-19 MED ORDER — DULOXETINE HCL 30 MG PO CPEP: 30.0000 mg | ORAL_CAPSULE | Freq: Every day | ORAL | 2 refills | Status: DC

## 2023-05-19 NOTE — Patient Instructions (Signed)
Thank you so much for coming to the clinic today!   I have reordered a few of your meds, including the trelegy, and duonebs. I have also placed a referral to psychiatry and opthalmology. We are starting you on a medication called Duloxetine which should help with the pain and mood. We are also getting scans of your neck and your lower back.   If you have any questions please feel free to the call the clinic at anytime at [phone]. It was a pleasure seeing you!  Best, Dr. Thomasene Ripple

## 2023-05-20 ENCOUNTER — Telehealth: Payer: Medicaid Other | Admitting: Student

## 2023-05-20 ENCOUNTER — Encounter: Payer: Self-pay | Admitting: Physical Therapy

## 2023-05-20 ENCOUNTER — Ambulatory Visit: Payer: 59 | Admitting: Physical Therapy

## 2023-05-20 ENCOUNTER — Telehealth: Payer: Self-pay | Admitting: *Deleted

## 2023-05-20 VITALS — BP 113/77 | HR 99 | Resp 96

## 2023-05-20 DIAGNOSIS — R42 Dizziness and giddiness: Secondary | ICD-10-CM

## 2023-05-20 DIAGNOSIS — R2681 Unsteadiness on feet: Secondary | ICD-10-CM

## 2023-05-20 DIAGNOSIS — M6281 Muscle weakness (generalized): Secondary | ICD-10-CM

## 2023-05-20 DIAGNOSIS — M545 Low back pain, unspecified: Secondary | ICD-10-CM

## 2023-05-20 DIAGNOSIS — R296 Repeated falls: Secondary | ICD-10-CM

## 2023-05-20 DIAGNOSIS — R269 Unspecified abnormalities of gait and mobility: Secondary | ICD-10-CM

## 2023-05-20 DIAGNOSIS — M25552 Pain in left hip: Secondary | ICD-10-CM

## 2023-05-20 NOTE — Telephone Encounter (Signed)
Patient called in stating her O2 tank is running out. She does not have an oxygen concentrator at home, nor a pulse oximeter. She contacted [employer] but they told her they cannot bring out additional tanks till 6/12. She is advised to head directly to ED. States she will drive herself there now.  She is also requesting portable oxygen tank that fits in a backpack.

## 2023-05-20 NOTE — Assessment & Plan Note (Signed)
Refilled DuoNebs, Trelegy inhaler.

## 2023-05-20 NOTE — Therapy (Signed)
OUTPATIENT PHYSICAL THERAPY VESTIBULAR TREATMENT   Patient Name: Kathleen [name] MRN: [id number] DOB:[birth year], 62 y.o., female Today's Date: 05/20/2023  END OF SESSION:  PT End of Session - 05/20/23 1324     Visit Number 5    Number of Visits 13    Date for PT Re-Evaluation 06/16/23    Authorization Type Medicaid    PT Start Time 1323    PT Stop Time 1401    PT Time Calculation (min) 38 min    Equipment Utilized During Treatment Gait belt    Activity Tolerance Patient tolerated treatment well    Behavior During Therapy WFL for tasks assessed/performed;Anxious             Past Medical History:  Diagnosis Date   Anxiety    Arthritis    hands   Asthma    2 sets of PFT's in 04/09 without sign variability. Last set with significant decrease in FEV1 with saline alone, suggesting Asthma but  recommended clinical corelation    Asthma    Bipolar 1 disorder [facility])    therapist is Joy and is followed by [employer] [facility]   Blackout    negative work up including ESR, ANA, opthalmology referral, carotid dopplers, 2D echo, MRI and EEG.   BREAST LUMP 03/25/2008   Annotation: bilaterally Qualifier: Diagnosis of  By: Candis Musa MD, Ruben Reason.    Breast mass in female    s/p mammogram, u/s and biopsy in 05/09 c/w fibroadenoma,   Bronchitis    Chronic headache    Chronic low back pain 08/08/2008   Qualifier: Diagnosis of  By: Andrey Campanile  MD, Valerie     Chronic pain    normal work up including TSH, RPR, B12, HIV, plain films, 2 ESR's, ANA, CK, RF along with routine CBC, CMET and UA. Further work up includes CRP, ESR, SPEP/UPEP, hepatitis erology, A1C , repeat ANA   COPD (chronic obstructive pulmonary disease) (HCC)    COPD exacerbation (HCC) 03/31/2019   Depression    DUB (dysfunctional uterine bleeding)    and pelvic pain, with negative endometrial bx in 07/09 and transvaginal U/S significant for mild fibroids in 0/09.   Emphysema of lung (HCC)    GERD (gastroesophageal reflux  disease)    Hallucinations 03/18/2008   Qualifier: Diagnosis of  By: Andrey Campanile  MD, Vikki Ports     Hyperlipidemia    Lower extremity edema    Neg ABI's, normal 2D echo, normal albumin   Menorrhagia    Ovarian cyst    Polysubstance abuse (HCC)    none since March 17,2009.   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   Tubular adenoma of colon    Past Surgical History:  Procedure Laterality Date   CESAREAN SECTION     CESAREAN SECTION     COLONOSCOPY     HERNIA REPAIR     Left partial oophorectomy     OOPHORECTOMY     1/2 ovary removed   POLYPECTOMY     VAGINA SURGERY     mesh   Patient Active Problem List   Diagnosis Date Noted   Asthma exacerbation 04/07/2023   Acute respiratory failure with hypoxia and hypercapnia (HCC) 03/08/2023   Chronic back pain 03/08/2023   OSA (obstructive sleep apnea) 03/08/2023   Acute respiratory failure with hypoxia (HCC) 03/08/2023   COPD exacerbation (HCC) 01/30/2023   Syncope and collapse  01/30/2023   Malnutrition of moderate degree 11/17/2022   Marijuana abuse 08/17/2022   Weight loss 07/07/2022   Sialoadenitis 03/16/2022   Skin growth 08/28/2021   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   Daytime somnolence 04/06/2019   Lumbar radiculopathy 07/26/2016   Depression 09/19/2015   Preventative health care 06/18/2014   Tobacco abuse 03/13/2013   GERD 01/21/2010   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   Generalized anxiety disorder 03/14/2008   Migraine variant 03/14/2008    PCP: Belva Agee, MD REFERRING PROVIDER: Earl Lagos, MD  REFERRING DIAG: J96.01 (ICD-10-CM) - Acute respiratory failure with hypoxia (HCC) R55 (ICD-10-CM) - Syncope and collapse  THERAPY DIAG:   Abnormality of gait and mobility  Unsteadiness on feet  Repeated falls  Dizziness and giddiness  Pain in left hip  Chronic bilateral low back pain without sciatica  Muscle weakness  (generalized)  ONSET DATE:  04/08/2023 (referral date)  Rationale for Evaluation and Treatment: Rehabilitation  SUBJECTIVE:   SUBJECTIVE STATEMENT: Patient arrives to session with oxygen donned but tank is low. Patient states she is not sure why as she just had it filled last week. Patient states that she had one fall this morning; she tripped. She got dizzy and was not wearing oxygen at the time. She has been doing the exercises with her grandson. Patient was started on new dizziness medication but cannot recall what it is. She has not taken it yet. Patient states that she needs to go to [facility] for "neck stone" removal but hasn't been able to follow up. Has not heard from neuro psych.   Pt accompanied by: self  PERTINENT HISTORY: asthma, COPD, anxiety,  bipolar 1, hx marijuana abuse, copd with frequent readmissions presents to ed with acute sob and wheezing   PAIN:  Are you having pain? Yes: NPRS scale: 10/10 Pain location: back and L leg, and L arm Pain description: pounding and sharp Aggravating factors: loud noise Relieving factors: pain medication/peace and quiet  Reports having pain 24/7.   Vitals:   05/20/23 1332  BP: 113/77  Pulse: 99  Resp: (!) 96    PRECAUTIONS: Fall - asthma (patient brings inhaler with her); pulmonologist put patient on 3L of oxygen at baseline - patient arrived without during session but SPO2 holding steady at 93-98% (advised bringing for all future sessions)  WEIGHT BEARING RESTRICTIONS: No  FALLS: Has patient fallen in last 6 months? Yes. 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 No intracranial abnormality. 2. Sinus mucosal thickening.  COGNITION: Overall cognitive status: Impaired - reports decreased short term memory   VESTIBULAR TREATMENT:  96-98% Oxygen Levels  NMR: Seated VOR 3 x 20-30" with plain background and horizontal head movements mild dizziness noted (intermittent eyes getting thrown off target) Seated VOR 3 x 20-30" with plain background and horizontal head movements mild dizziness noted (intermittent eyes getting thrown off target) Head turns side to side seated for habituation 3 x 10 turns (mild/moderate levels of dizziness reported) Vertical head turns side to side seated for habituation 3 x 10 turns (moderate levels of dizziness) Caryl Bis x 3 each side - mild lightheadedness noted but no true dizziness Standing on foam with vertical head turns NBOS 2 x 10 (CGA-minA) Standing on foam with horizontal head turns NBOS 2 x 10 (CGA-minA) Reported feeling a little lightheaded after last round and had to sit down  TherAct:  Provided patient a written list of follow up things. Stated she needed to follow up about getting new oxygen tank as her was on low. Requested she follow up with PCP about neuropsychic/behavioral health option. Reported need to call [facility] about throat stones and schedule appointment.   PATIENT EDUCATION: Education details: Discussed negative positional testing, findings from mCTSIB, balance additions to HEP  Person educated: Patient Education method: Explanation, Demonstration, Verbal cues, and Handouts Education comprehension: verbalized understanding, returned demonstration, and needs further education  HOME EXERCISE PROGRAM: Seated head turns, nods 2 x 5 reps Access Code: [email or web address] URL: [email or web address] Date:  05/17/2023 Prepared by: Sherlie Ban  Exercises - Seated Horizontal Saccades  - 2 x daily - 7 x weekly - 2 sets - 10 reps - Seated Vertical Saccades  - 2 x daily - 7 x weekly - 2 sets - 10 reps - Standing Balance with Eyes Closed  - 1 x daily - 7 x weekly - 3 sets - 30 hold - Standing with Head Rotation  - 1 x daily - 7 x weekly - 2 sets - 5 reps  GOALS: Goals reviewed with patient? Yes  SHORT TERM GOALS: Target date: 05/12/2023  Patient will demonstrate 100% compliance with initial HEP to continue to progress between physical therapy sessions.   Baseline: To be provided Goal status: INITIAL  2.  Patient will improve [place] Post Concussion Symptoms Questionnaire RPQ-3 score to highest reported symptom severity as 3/4 points to indicate reduced severity of post-concussive symptoms to progress towards PLOF.   Baseline: 4/4 Goal status: INITIAL  3.  Therapist will assess mCTSIB and write goal as indicated to target patient's baseline balance challenges. Baseline: MET  Goal status: MET  4.  Therapist will assess MSQ and write goal as indicated to determine patient's baseline motion sensitivity. Baseline: Assessed on 04/29/2023 Goal status: MET  LONG TERM GOALS: Target date: 06/02/2023  Patient will report demonstrate independence with final HEP in order to maintain current gains and continue to progress after physical therapy discharge.   Baseline: To be assessed Goal status: INITIAL  2.  Patient will improve [place] Post Concussion Symptoms Questionnaire RPQ-3 and RPQ-13 score to highest reported symptom severity as 2/4 points to indicate reduced severity of post-concussive symptoms to progress towards PLOF.   Baseline: 4/4 Goal status: INITIAL  3.  Pt will improve condition 2 of mCTSIB to at least 20 seconds and condition 4 to at least 10 seconds in order to demo improved balance with vision removed.  Baseline: condition 2: 11.8 seconds, condition 4: 5.1 seconds  Goal  status: INITIAL  4.  Patient will score < 2 on components of MSQ indicate reduction in motion sensitivity in order  to progress towards baseline function.  Baseline: greatest 4/5 Goal status: INITIAL  ASSESSMENT:  CLINICAL IMPRESSION:   Today's session focused on continued neuro re-education with emphasis on continued habituation/adaptation exercises. Patient reported increased dizziness in today's session with vertical movements > horizontal. Required minA with balance on foam mat with head turns to prevent falls. Patient provided written sheet of tasks to perform before next session to improve ability to safely participate in therapy. Continue POC.     OBJECTIVE IMPAIRMENTS: Abnormal gait, cardiopulmonary status limiting activity, decreased activity tolerance, decreased balance, decreased cognition, decreased endurance, decreased knowledge of use of DME, decreased mobility, difficulty walking, decreased safety awareness, dizziness, and pain.   ACTIVITY LIMITATIONS: carrying, lifting, bending, standing, transfers, and locomotion level  PARTICIPATION LIMITATIONS: community activity and yard work  PERSONAL FACTORS: Past/current experiences, Social background, and 3+ comorbidities: see above  are also affecting patient's functional outcome.   REHAB POTENTIAL: Fair may be complicated by complex recent history and significance of distress around recent events  CLINICAL DECISION MAKING: Unstable/unpredictable  EVALUATION COMPLEXITY: High   PLAN:  PT FREQUENCY: 2x/week  PT DURATION: 6 weeks  PLANNED INTERVENTIONS: Therapeutic exercises, Therapeutic activity, Neuromuscular re-education, Balance training, Gait training, Patient/Family education, Self Care, Joint mobilization, Vestibular training, Canalith repositioning, DME instructions, Dry Needling, Manual therapy, and Re-evaluation  PLAN FOR NEXT SESSION: check last STG. follow up about neuro psych/counseling, possibly may benefit from  OT or speech pending on additional findings. Work on habituation tasks as able. VOR tasks, balance with vision removed, unlevel surfaces.   Carmelia Bake, PT, DPT 05/20/2023, 4:13 PM   Check all possible CPT codes: 16109 - PT Re-evaluation, 97110- Therapeutic Exercise, [phone]- Neuro Re-education, [phone] - Gait Training, [phone] - Manual Therapy, [phone] - Therapeutic Activities, and [phone] - Self Care    Check all conditions that are expected to impact treatment: {Conditions expected to impact treatment:Respiratory disorders, Cognitive Impairment or Intellectual disability, Musculoskeletal disorders, Neurological condition and/or seizures, and Psychological or psychiatric disorders   If treatment provided at initial evaluation, no treatment charged due to lack of authorization.

## 2023-05-22 DIAGNOSIS — M5412 Radiculopathy, cervical region: Secondary | ICD-10-CM | POA: Insufficient documentation

## 2023-05-22 NOTE — Assessment & Plan Note (Signed)
Pt with prolonged psychiatric history, including PTSD, GAD, and MDD. Will start her on duloxetine for mood symptoms as well as neuropathic pain (discussed elsewhere). Will also place referral to psychiatry, as she is very interested in counseling.   Plan:  - Duloxetine 30mg  (can increase at next visit if indicated)  - Referral to psychiatry

## 2023-05-22 NOTE — Assessment & Plan Note (Signed)
Pt on chronic 3L of oxygen currently using tank. Believe she would benefit greatly in terms of convenience from oxygen saturator. Will place referral for DME.
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
## 2023-05-23 ENCOUNTER — Telehealth: Payer: Self-pay
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
## 2023-05-23 NOTE — Addendum Note (Signed)
Addended byOlegario Messier on: 05/23/2023 03:15 PM   Modules accepted: Orders

## 2023-05-24 ENCOUNTER — Ambulatory Visit: Payer: 59 | Admitting: Physical Therapy

## 2023-05-24 ENCOUNTER — Telehealth: Payer: Self-pay | Admitting: Physical Therapy

## 2023-05-24 DIAGNOSIS — R269 Unspecified abnormalities of gait and mobility: Secondary | ICD-10-CM

## 2023-05-24 NOTE — Telephone Encounter (Signed)
Called pt regarding telephone encounter from nurse on 05/20/23. Had to leave voicemail. Pt's O2 tank is running out and pt was advised to head directly to the ED. Pt did not go to the ED and the company can't bring out additional tanks until 6/12. Left voicemail that pt is not appropriate for PT today until she has her O2 and that we will need to cancel today's appt.    Sherlie Ban, PT, DPT 05/24/23 11:56 AM   [facility] [address] [place], [place]  [address] Phone:  [phone] Fax:  [phone]

## 2023-05-24 NOTE — Therapy (Addendum)
[facility] [address] [place], [place], [address] Phone: [phone]   Fax:  [phone]  Patient Details  Name: Kathleen [name] MRN: [id number] Date of Birth: [birth year] Referring Provider:  Belva Agee, *  Encounter Date: 05/24/2023  Arrived - No Charge   Pt arrived to therapy session with empty O2 tank. Pt was originally advised by a nurse to go to the ED (from telephone encounter on 05/20/23) and pt won't get additional tanks until tomorrow.   Attempted to call pt earlier and left voicemail about cancelling today's appt until she gets her new O2 tanks. Pt did not receive voicemail and educated on safety concerns and today's appt will be an arrive no charge until she get her new O2 tanks. Pt verbalized understanding.    Drake Leach, PT, DPT  05/24/2023, 1:21 PM  [facility] [address] [place], [place], [address] Phone: [phone]   Fax:  [phone]

## 2023-05-25 NOTE — Progress Notes (Signed)
Internal Medicine Clinic Attending  Case discussed with Dr. Nooruddin  At the time of the visit.  We reviewed the resident's history and exam and pertinent patient test results.  I agree with the assessment, diagnosis, and plan of care documented in the resident's note.  

## 2023-05-27 ENCOUNTER — Telehealth: Payer: Self-pay | Admitting: *Deleted

## 2023-05-27 ENCOUNTER — Ambulatory Visit: Payer: 59 | Admitting: Physical Therapy

## 2023-05-27 ENCOUNTER — Telehealth: Payer: Self-pay | Admitting: Physical Therapy

## 2023-05-27 NOTE — Telephone Encounter (Signed)
Call from patient.  States is out of her oxygen,  The Oxygen company has not delivered her Oxygen as they were unable to reach her as she had given them a wrong number.  Call to the [employer] Company.  Had initially not received  her order.  After talking to the representative order was found .  Patient will need to have Oxygen Sats sent that were not with the order from a recent test.  Patient was advised and suggestion from [employer] to go to the ER and and order could be done there and Oxygen can be provided for the patient today.  Patient to go to the ER for Oxygen. Form for future orders for patient will be faxed from the [employer] Company today.

## 2023-05-27 NOTE — Telephone Encounter (Signed)
Called and LVM for patient. Explained 3 cancellation/no show policy. This is patient's 4th no show. Explained that would need to discharge patient and she would need new referral at this time.   Patient called front desk back and requested therapist call her back.  Therapist attempted to call patient back and LVM. Therapist will try to speak with patient before canceling visits and putting in discharge.   Maryruth Eve, PT, DPT

## 2023-05-30 ENCOUNTER — Ambulatory Visit (INDEPENDENT_AMBULATORY_CARE_PROVIDER_SITE_OTHER): Payer: 59 | Admitting: Student

## 2023-05-30 ENCOUNTER — Encounter: Payer: Self-pay | Admitting: Student

## 2023-05-30 ENCOUNTER — Other Ambulatory Visit: Payer: Self-pay | Admitting: Internal Medicine

## 2023-05-30 ENCOUNTER — Telehealth: Payer: Self-pay | Admitting: Physical Therapy

## 2023-05-30 VITALS — BP 104/77 | HR 95 | Temp 98.4°F | Wt 113.6 lb

## 2023-05-30 DIAGNOSIS — F32A Depression, unspecified: Secondary | ICD-10-CM | POA: Diagnosis not present

## 2023-05-30 DIAGNOSIS — J4489 Other specified chronic obstructive pulmonary disease: Secondary | ICD-10-CM

## 2023-05-30 DIAGNOSIS — Z720 Tobacco use: Secondary | ICD-10-CM

## 2023-05-30 DIAGNOSIS — F411 Generalized anxiety disorder: Secondary | ICD-10-CM

## 2023-05-30 DIAGNOSIS — R634 Abnormal weight loss: Secondary | ICD-10-CM

## 2023-05-30 DIAGNOSIS — Z87891 Personal history of nicotine dependence: Secondary | ICD-10-CM

## 2023-05-30 DIAGNOSIS — Z1231 Encounter for screening mammogram for malignant neoplasm of breast: Secondary | ICD-10-CM

## 2023-05-30 DIAGNOSIS — R0602 Shortness of breath: Secondary | ICD-10-CM

## 2023-05-30 MED ORDER — IPRATROPIUM-ALBUTEROL 0.5-2.5 (3) MG/3ML IN SOLN
3.0000 mL | Freq: Once | RESPIRATORY_TRACT | Status: AC
Start: 2023-05-30 — End: 2023-05-30
  Administered 2023-05-30: 3 mL via RESPIRATORY_TRACT

## 2023-05-30 NOTE — Assessment & Plan Note (Signed)
Patient with asthma-COPD overlap syndrome. She follows Dr. Judeth Horn (pulmonology). Currently on trelegy, duonebs. She is having difficulty with affording $4 copay with inhalers but unfortunately unable to get it at cheaper price. She was previously on oxygen at 3L [place] after hospital discharge and requesting to renew this.   On exam, she is slightly tachypneic but not using any accessory muscles. She does have diffuse bilateral wheezing on exam. Her oxygen on arrival was 98% on RA.   Ambulatory O2 sats with walk test was 93% on RA. We discussed that insurance will not cover oxygen given she does not meet criteria for oxygen with these results. She would not be able to afford oxygen OOP. She became very frustrated with these news. Attempted to provide reassurance.  Provided breathing treatment with duonebs with improvement in wheezing. Oxygen saturations remained good on RA.   Plan: -continue trelegy, duonebs -f/u with pulmonology -does not qualify for home oxygen at this time

## 2023-05-30 NOTE — Patient Instructions (Addendum)
Kathleen [name],  It was a pleasure seeing you in the clinic today.   I have ordered a CT scan to screen for lung cancer. They will call you to schedule this. Your mammogram has been scheduled at the following date listed below. Please come back to see [place] in 1 month.  Mammogram: GI breast center Date: 06/02/2023 at 9:20AM Please wear two piece clothing and wear no powder or deodorant. Please arrive 15 minutes early prior to your appointment time.   Please call our clinic at [phone] if you have any questions or concerns. The best time to call is Monday-Friday from 9am-4pm, but there is someone available 24/7 at the same number. If you need medication refills, please notify your pharmacy one week in advance and they will send [place] a request.   Thank you for letting us take part in your care. We look forward to seeing you next time!

## 2023-05-30 NOTE — Progress Notes (Signed)
SATURATION QUALIFICATIONS: (This note is used to comply with regulatory documentation for home oxygen)  Patient Saturations on Room Air at Rest = -96-97%  Patient Saturations on Room Air while Ambulating = 93%  Patient Saturations on 0 Liters of oxygen while Ambulating = n/a  Please briefly explain why patient needs home oxygen: Defer to MD

## 2023-05-30 NOTE — Assessment & Plan Note (Signed)
Did not fill out PHQ-9 screening today. Wrote an explicit message on screening form likely due to frustration. Will attempt to get this at next visit.

## 2023-05-30 NOTE — Progress Notes (Signed)
CC: weight loss, asking to renew oxygen  HPI:  Kathleen [name] is a 62 y.o. female with history listed below presenting to the [facility] for weight loss, asking to renew oxygen. Please see individualized problem based charting for full HPI.  Past Medical History:  Diagnosis Date   Anxiety    Arthritis    hands   Asthma    2 sets of PFT's in 04/09 without sign variability. Last set with significant decrease in FEV1 with saline alone, suggesting Asthma but  recommended clinical corelation    Asthma    Bipolar 1 disorder [facility])    therapist is Joy and is followed by [employer] [facility]   Blackout    negative work up including ESR, ANA, opthalmology referral, carotid dopplers, 2D echo, MRI and EEG.   BREAST LUMP 03/25/2008   Annotation: bilaterally Qualifier: Diagnosis of  By: Candis Musa MD, Ruben Reason.    Breast mass in female    s/p mammogram, u/s and biopsy in 05/09 c/w fibroadenoma,   Bronchitis    Chronic headache    Chronic low back pain 08/08/2008   Qualifier: Diagnosis of  By: Andrey Campanile  MD, Valerie     Chronic pain    normal work up including TSH, RPR, B12, HIV, plain films, 2 ESR's, ANA, CK, RF along with routine CBC, CMET and UA. Further work up includes CRP, ESR, SPEP/UPEP, hepatitis erology, A1C , repeat ANA   COPD (chronic obstructive pulmonary disease) (HCC)    COPD exacerbation (HCC) 03/31/2019   Depression    DUB (dysfunctional uterine bleeding)    and pelvic pain, with negative endometrial bx in 07/09 and transvaginal U/S significant for mild fibroids in 0/09.   Emphysema of lung (HCC)    GERD (gastroesophageal reflux disease)    Hallucinations 03/18/2008   Qualifier: Diagnosis of  By: Andrey Campanile  MD, Vikki Ports     Hyperlipidemia    Lower extremity edema    Neg ABI's, normal 2D echo, normal albumin   Menorrhagia    Ovarian cyst    Polysubstance abuse (HCC)    none since March 17,2009.   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  Physical Exam:  Vitals:   05/30/23 1427  BP: 104/77  Pulse: 95  Temp: 98.4 F (36.9 C)  TempSrc: Oral  SpO2: 98%  Weight: 113 lb 9.6 oz (51.5 kg)   Physical Exam Constitutional:      Appearance: She is normal weight.     Comments: Chronically ill-appearing  HENT:     Mouth/Throat:     Mouth: Mucous membranes are moist.     Pharynx: Oropharynx is clear. No oropharyngeal exudate.  Eyes:     Extraocular Movements: Extraocular movements intact.  Cardiovascular:     Rate and Rhythm: Normal rate and regular rhythm.     Heart sounds: Normal heart sounds. No murmur heard.    No friction rub. No gallop.  Pulmonary:     Effort: Pulmonary effort is normal. Tachypnea present. No accessory muscle usage or respiratory distress.     Breath sounds: Normal breath sounds. No stridor. No rhonchi or rales.     Comments: Diffuse wheezing bilaterally.  Ambulatory O2 saturations 93% on RA. Abdominal:     General: Bowel  sounds are normal.     Palpations: Abdomen is soft.     Tenderness: There is no abdominal tenderness.  Musculoskeletal:     Right lower leg: No edema.     Left lower leg: No edema.  Skin:    General: Skin is warm and dry.  Neurological:     General: No focal deficit present.     Mental Status: She is alert.  Psychiatric:     Comments: Frustrated mood      Assessment & Plan:   See Encounters Tab for problem based charting.  Patient discussed with Dr. Antony Contras

## 2023-05-30 NOTE — Telephone Encounter (Signed)
Called and LVM stating patient will be allowed 1 visit at a time scheduling but will have to do formal D/C if has another no show. Reminded of upcoming appointment.  Maryruth Eve, PT, DPT

## 2023-05-30 NOTE — Assessment & Plan Note (Signed)
Patient with close to 40 lb weight loss since March 2023, unintentional. She reports lack of appetite. She reports having smoked about 0.25 packs since she was 15, quit about a year or so ago (close to 12 pack-year history). She notes that her mother had lower abdomen cancer, father has prostate cancer, brother had lung cancer, and sister had colon cancer. She is worried that she may have cancer and that is why she is having weight loss.   She had a colonoscopy in 2020 which revealed 2 polyps. Repeat colonoscopy recommended in 2025 for surveillance. She is due for a screening mammogram which was not yet scheduled. Chilon was able to help schedule this for 06/02/2023 and I provided her with information (date, time, location). She is due for a pap smear (last pap smear in 2019 was negative but did not include transformation zone). She refused this today. Given smoking history, family history of lung cancer, and weight loss, would recommend diagnostic CT chest to rule out lung cancer. This was ordered in 2023 but was never obtained. Placed order today.  Plan: -f/u mammogram -f/u CT chest -pap smear at next visit -colonoscopy in 1 year

## 2023-05-30 NOTE — Assessment & Plan Note (Signed)
Did not fill out GAD-7 screening today. Wrote an explicit message on screening form likely due to frustration. Will attempt to get this at next visit.

## 2023-05-31 ENCOUNTER — Encounter: Payer: Self-pay | Admitting: Physical Therapy

## 2023-05-31 ENCOUNTER — Ambulatory Visit: Payer: 59 | Admitting: Physical Therapy

## 2023-05-31 DIAGNOSIS — R269 Unspecified abnormalities of gait and mobility: Secondary | ICD-10-CM

## 2023-05-31 DIAGNOSIS — R2681 Unsteadiness on feet: Secondary | ICD-10-CM

## 2023-05-31 DIAGNOSIS — R296 Repeated falls: Secondary | ICD-10-CM

## 2023-05-31 DIAGNOSIS — R42 Dizziness and giddiness: Secondary | ICD-10-CM

## 2023-05-31 DIAGNOSIS — M6281 Muscle weakness (generalized): Secondary | ICD-10-CM

## 2023-05-31 DIAGNOSIS — M25552 Pain in left hip: Secondary | ICD-10-CM

## 2023-05-31 DIAGNOSIS — M545 Low back pain, unspecified: Secondary | ICD-10-CM

## 2023-05-31 NOTE — Therapy (Signed)
OUTPATIENT PHYSICAL THERAPY VESTIBULAR TREATMENT / DISCHARGE   Patient Name: Kathleen [name] MRN: [id number] DOB:10-08-1961, 62 y.o., female Today's Date: 05/31/2023   PHYSICAL THERAPY DISCHARGE SUMMARY  Visits from Start of Care: 6  Current functional level related to goals / functional outcomes: Achieved partial goals  Remaining deficits: Progressing, still dizziness, [place] deficits, and falls risk     Education / Equipment: Return to therapy with new referral when schedule allows for more consistency   Patient agrees to discharge. Patient goals were partially met. Patient is being discharged due to the patient's request. While undergoing medical testing.   END OF SESSION:  PT End of Session - 05/31/23 1359     Visit Number 6    Number of Visits 13    Date for PT Re-Evaluation 06/16/23    Authorization Type Medicaid    PT Start Time 1355    PT Stop Time 1430    PT Time Calculation (min) 35 min    Equipment Utilized During Treatment Gait belt    Activity Tolerance Patient tolerated treatment well    Behavior During Therapy WFL for tasks assessed/performed             Past Medical History:  Diagnosis Date   Acute respiratory failure with hypoxia and hypercapnia (HCC) 03/08/2023   Anxiety    Arthritis    hands   Asthma    2 sets of PFT's in 04/09 without sign variability. Last set with significant decrease in FEV1 with saline alone, suggesting Asthma but  recommended clinical corelation    Asthma    Asthma exacerbation 04/07/2023   Bipolar 1 disorder [facility])    therapist is Joy and is followed by [employer] [facility]   Blackout    negative work up including ESR, ANA, opthalmology referral, carotid dopplers, 2D echo, MRI and EEG.   BREAST LUMP 03/25/2008   Annotation: bilaterally Qualifier: Diagnosis of  By: Candis Musa MD, Ruben Reason.    Breast mass in female    s/p mammogram, u/s and biopsy in 05/09 c/w fibroadenoma,   Bronchitis    Chronic headache     Chronic low back pain 08/08/2008   Qualifier: Diagnosis of  By: Andrey Campanile  MD, Valerie     Chronic pain    normal work up including TSH, RPR, B12, HIV, plain films, 2 ESR's, ANA, CK, RF along with routine CBC, CMET and UA. Further work up includes CRP, ESR, SPEP/UPEP, hepatitis erology, A1C , repeat ANA   COPD (chronic obstructive pulmonary disease) (HCC)    COPD exacerbation (HCC) 03/31/2019   Depression    DUB (dysfunctional uterine bleeding)    and pelvic pain, with negative endometrial bx in 07/09 and transvaginal U/S significant for mild fibroids in 0/09.   Emphysema of lung (HCC)    GERD (gastroesophageal reflux disease)    Hallucinations 03/18/2008   Qualifier: Diagnosis of  By: Andrey Campanile  MD, Vikki Ports     Hyperlipidemia    Lower extremity edema    Neg ABI's, normal 2D echo, normal albumin   Menorrhagia    Ovarian cyst    Polysubstance abuse (HCC)    none since March 17,2009.   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   Tubular adenoma of colon    Past Surgical History:  Procedure Laterality Date   CESAREAN SECTION     CESAREAN SECTION     COLONOSCOPY  HERNIA REPAIR     Left partial oophorectomy     OOPHORECTOMY     1/2 ovary removed   POLYPECTOMY     VAGINA SURGERY     mesh   Patient Active Problem List   Diagnosis Date Noted   Cervical radiculopathy 05/22/2023   Chronic back pain 03/08/2023   OSA (obstructive sleep apnea) 03/08/2023   COPD exacerbation (HCC) 01/30/2023   Syncope and collapse 01/30/2023   Malnutrition of moderate degree 11/17/2022   Marijuana abuse 08/17/2022   Weight loss 07/07/2022   Sialoadenitis 03/16/2022   Skin growth 08/28/2021   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   Daytime somnolence 04/06/2019   Lumbar radiculopathy 07/26/2016   Depression 09/19/2015   Healthcare maintenance 06/18/2014   Tobacco abuse 03/13/2013   GERD 01/21/2010   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    Generalized anxiety disorder 03/14/2008   Migraine variant 03/14/2008    PCP: Belva Agee, MD REFERRING PROVIDER: Earl Lagos, MD  REFERRING DIAG: J96.01 (ICD-10-CM) - Acute respiratory failure with hypoxia (HCC) R55 (ICD-10-CM) - Syncope and collapse  THERAPY DIAG:   Abnormality of gait and mobility  Unsteadiness on feet  Repeated falls  Dizziness and giddiness  Pain in left hip  Chronic bilateral low back pain without sciatica  Muscle weakness (generalized)  ONSET DATE:  04/08/2023 (referral date)  Rationale for Evaluation and Treatment: Rehabilitation  SUBJECTIVE:   SUBJECTIVE STATEMENT: Patient arrives to session without oxygen. She has been taken of it per medical chart as levels stable on room air. Patient reports ongoing testing including upcoming mammogram, extreme weight loss, and chest CT. Patient would like to discharge at this time due to ongoing medical challenges and return when it is easier for her to be more consistent with PT.   Pt accompanied by: self  PERTINENT HISTORY: asthma, COPD, anxiety,  bipolar 1, hx marijuana abuse, copd with frequent readmissions presents to ed with acute sob and wheezing   PAIN:  Are you having pain? Yes: NPRS scale: 10/10 Pain location: back and L leg, and L arm Pain description: pounding and sharp Aggravating factors: loud noise Relieving factors: pain medication/peace and quiet  Reports having pain 24/7.   There were no vitals filed for this visit.   PRECAUTIONS: Fall - asthma (patient brings inhaler with her); pulmonologist put patient on 3L of oxygen at baseline - patient arrived without during session but SPO2 holding steady at 93-98% (advised bringing for all future sessions)  WEIGHT BEARING RESTRICTIONS: No  FALLS: Has patient fallen in last 6 months? Yes. 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 No intracranial abnormality. 2. Sinus mucosal thickening.  COGNITION: Overall cognitive status: Impaired - reports decreased short term memory   VESTIBULAR TREATMENT:  95-98% Oxygen Levels  THERE ACT (Assessment of Goals):  [place] Post Concussion Symptoms Questionnaire:  Compared to before the accident, do you now (last 24 hours) suffer from:  Headaches 4 = severe problem  Feeling of Dizziness 3 = moderate problem  Nausea and/or vomiting 2 = mild problem  RPQ-3 (total of first 3 items) 9     Noise sensitivity (easily upset by loud noises) 3 = moderate problem  Sleep disturbances 3 = moderate problem  Fatigue, tiring more easily 2 = mild problem  Being irritable, easily angered: 2 = mild problem  Feeling depressed or tearful 4 = severe problem  Feeling frustrated or impatient 4 = severe problem  Forgetfulness, poor memory 4 = severe problem  Poor concentration 4 = severe problem  Taking longer to think 4 = severe problem  Blurred vision 4 = severe problem  Light sensitivity (easily upset by bright light) 4 = severe problem  Double vision 2 = mild problem  Restlessness 2 = mild problem  RPQ-13 (total for next 13 items) 44     MOTION SENSITIVITY:    Motion Sensitivity Quotient Intensity: 0 = none, 1 = Lightheaded, 2 = Mild, 3 = Moderate, 4 = Severe, 5 = Vomiting  Intensity  1. Sitting to supine 0  2. Supine to L side 2  3. Supine to R side 2  4. Supine to sitting 2  5. L Hallpike-Dix   6. Up  from L    7. R Hallpike-Dix   8. Up from R    9. Sitting, head  tipped to L knee 0  10. Head up from L  knee 0  11. Sitting, head  tipped to R knee 0  12. Head up from R  knee 0  13. Sitting head turns x5 2  14.Sitting head nods x5 0  15. In stance, 180  turn to L  2  16. In stance, 180  turn to R 0         M-CTSIB  Condition 1: Firm Surface, EO 30 Sec, Mild Sway  Condition 2: Firm Surface, EC 30 Sec, Mild-mod Sway  Condition 3: Foam Surface, EO 30 Sec, Mod Sway  Condition 4: Foam Surface, EC 17 Sec Severe Sway     FUNCTIONAL TESTS:   PATIENT EDUCATION: Education details: When to return to PT Person educated: Patient Education method: Programmer, multimedia, Facilities manager, Verbal cues, and Handouts Education comprehension: verbalized understanding  HOME EXERCISE PROGRAM: Seated head turns, nods 2 x 5 reps Access Code: [email or web address] URL: [email or web address] Date: 05/17/2023 Prepared by: Sherlie Ban  Exercises - Seated Horizontal Saccades  - 2 x daily - 7 x weekly - 2 sets - 10 reps - Seated Vertical Saccades  - 2 x daily - 7 x weekly - 2 sets - 10 reps - Standing Balance with Eyes Closed  - 1 x daily - 7 x weekly - 3 sets - 30 hold - Standing with Head Rotation  - 1 x daily - 7 x weekly - 2 sets - 5 reps  GOALS: Goals reviewed with patient? Yes  SHORT TERM GOALS: Target date: 05/12/2023  Patient will demonstrate 100% compliance with initial HEP to continue to progress between physical therapy sessions.   Baseline: To be provided Goal status: INITIAL  2.  Patient will improve [place] Post Concussion Symptoms Questionnaire RPQ-3 score to highest reported symptom severity as 3/4 points to indicate reduced severity of post-concussive symptoms to progress towards PLOF.   Baseline: 4/4 Goal status:  INITIAL  3.  Therapist will assess mCTSIB and write goal as indicated to target patient's baseline balance challenges. Baseline: MET  Goal status: MET  4.   Therapist will assess MSQ and write goal as indicated to determine patient's baseline motion sensitivity. Baseline: Assessed on 04/29/2023 Goal status: MET  LONG TERM GOALS: Target date: 06/02/2023  Patient will report demonstrate independence with final HEP in order to maintain current gains and continue to progress after physical therapy discharge.   Baseline: Patient reports feeling confident with exercises provided Goal status: MET  2.  Patient will improve [place] Post Concussion Symptoms Questionnaire RPQ-3 and RPQ-13 score to highest reported symptom severity as 2/4 points to indicate reduced severity of post-concussive symptoms to progress towards PLOF.   Baseline: 4/4; still 4/4 at worst but multiple below 4/4 now at this time Goal status: NOT MET - progressed  3.  Pt will improve condition 2 of mCTSIB to at least 20 seconds and condition 4 to at least 10 seconds in order to demo improved balance with vision removed.  Baseline: condition 2: 11.8 seconds, condition 4: 5.1 seconds  Goal status: INITIAL  4.  Patient will score < 2 on components of MSQ indicate reduction in motion sensitivity in order to progress towards baseline function.  Baseline: greatest 4/5; greatest 2/5 Goal status: NOT MET - progressed  ASSESSMENT:  CLINICAL IMPRESSION:   Patient is discharging today due to extent of upcoming medical testing and court case. Patient would like to return when her schedule allows for more consistency in PT. Patient has made notable progress during her time in PT and improved her balance, motion sensitivity, and reduced impairments on [place]. Patient does have ongoing residual deficits that do make her at an increased risk for falls and would benefit from being addressed when she feels ready to return to PT. Patient reports confidence in final HEP.    OBJECTIVE IMPAIRMENTS: Abnormal gait, cardiopulmonary status limiting activity, decreased activity tolerance, decreased  balance, decreased cognition, decreased endurance, decreased knowledge of use of DME, decreased mobility, difficulty walking, decreased safety awareness, dizziness, and pain.   ACTIVITY LIMITATIONS: carrying, lifting, bending, standing, transfers, and locomotion level  PARTICIPATION LIMITATIONS: community activity and yard work  PERSONAL FACTORS: Past/current experiences, Social background, and 3+ comorbidities: see above  are also affecting patient's functional outcome.   REHAB POTENTIAL: Fair may be complicated by complex recent history and significance of distress around recent events  CLINICAL DECISION MAKING: Unstable/unpredictable  EVALUATION COMPLEXITY: High   PLAN:  PT FREQUENCY: 2x/week  PT DURATION: 6 weeks  PLANNED INTERVENTIONS: Therapeutic exercises, Therapeutic activity, Neuromuscular re-education, Balance training, Gait training, Patient/Family education, Self Care, Joint mobilization, Vestibular training, Canalith repositioning, DME instructions, Dry Needling, Manual therapy, and Re-evaluation  PLAN FOR NEXT SESSION: Not indicated - patient discharged  Carmelia Bake, PT, DPT 05/31/2023, 2:47 PM   Check all possible CPT codes: 11914 - PT Re-evaluation, 97110- Therapeutic Exercise, [phone]- Neuro Re-education, [phone] - Gait Training, [phone] - Manual Therapy, 97530 - Therapeutic Activities, and [phone] - Self Care    Check all conditions that are expected to impact treatment: {Conditions expected to impact treatment:Respiratory disorders, Cognitive Impairment or Intellectual disability, Musculoskeletal disorders, Neurological condition and/or seizures, and Psychological or psychiatric disorders   If treatment provided at initial evaluation, no treatment charged due to lack of authorization.

## 2023-06-02 ENCOUNTER — Ambulatory Visit: Payer: Medicaid Other

## 2023-06-02 NOTE — Addendum Note (Signed)
Addended by: Burnell Blanks on: 06/02/2023 12:21 PM   Modules accepted: Level of Service

## 2023-06-02 NOTE — Progress Notes (Signed)
Internal Medicine Clinic Attending  Case discussed with Dr. Jinwala  At the time of the visit.  We reviewed the resident's history and exam and pertinent patient test results.  I agree with the assessment, diagnosis, and plan of care documented in the resident's note.  

## 2023-06-03 ENCOUNTER — Ambulatory Visit: Payer: 59 | Admitting: Physical Therapy

## 2023-06-06 ENCOUNTER — Ambulatory Visit
Admission: RE | Admit: 2023-06-06 | Discharge: 2023-06-06 | Disposition: A | Discharge status: Home / Self Care | Payer: Medicaid Other | Source: Ambulatory Visit | Attending: Internal Medicine | Admitting: Internal Medicine

## 2023-06-06 DIAGNOSIS — Z1231 Encounter for screening mammogram for malignant neoplasm of breast: Secondary | ICD-10-CM

## 2023-06-14 ENCOUNTER — Telehealth: Payer: Self-pay | Admitting: Student
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
## 2023-06-20 ENCOUNTER — Encounter: Payer: 59 | Admitting: Student

## 2023-06-24 ENCOUNTER — Telehealth: Payer: Self-pay | Admitting: *Deleted

## 2023-06-24 NOTE — Telephone Encounter (Signed)
CALLED PATIENT LVM CONCERNING APPOINTMENT FOR RADIOLOGY X 3. APPOINTMENT DATES AND TIME GIVEN TO ARRIVE 15 MIN EARLY.   APPOINTMENT ALSO MAILED.

## 2023-07-04 ENCOUNTER — Telehealth: Payer: Self-pay

## 2023-07-04 NOTE — Telephone Encounter (Signed)
Prior Authorization for patient (Trelegy Ellipta) came through on cover my meds was submitted per cover my meds "Your PA has been resolved, no additional PA is required. For further inquiries please contact the number on the back of the member prescription card. (Message [phone])"  ZOX:[id number]

## 2023-07-06 ENCOUNTER — Ambulatory Visit (HOSPITAL_COMMUNITY): Payer: 59

## 2023-07-13 ENCOUNTER — Ambulatory Visit (HOSPITAL_COMMUNITY): Payer: 59

## 2023-08-04 ENCOUNTER — Telehealth: Payer: Self-pay

## 2023-08-04 NOTE — Telephone Encounter (Signed)
Call to patient to come to Clinics to pick up samples of Ensure.

## 2023-08-04 NOTE — Telephone Encounter (Signed)
Pt is requesting a call back .Marland Kitchen She stated that  she is having trouble eating and usually when that happens our office gives her ensure  and she is wanting to know can she come get some

## 2023-08-11 ENCOUNTER — Ambulatory Visit (HOSPITAL_COMMUNITY): Payer: 59

## 2023-08-11 ENCOUNTER — Ambulatory Visit (HOSPITAL_COMMUNITY): Admission: RE | Admit: 2023-08-11 | Payer: 59 | Source: Ambulatory Visit

## 2023-08-19 ENCOUNTER — Encounter (HOSPITAL_COMMUNITY): Payer: Self-pay | Admitting: Emergency Medicine

## 2023-08-19 ENCOUNTER — Other Ambulatory Visit: Payer: Self-pay

## 2023-08-19 ENCOUNTER — Emergency Department (HOSPITAL_COMMUNITY)
Admission: EM | Admit: 2023-08-19 | Discharge: 2023-08-19 | Disposition: A | Discharge status: Home / Self Care | Payer: 59 | Attending: Emergency Medicine | Admitting: Emergency Medicine

## 2023-08-19 DIAGNOSIS — Y9301 Activity, walking, marching and hiking: Secondary | ICD-10-CM | POA: Diagnosis not present

## 2023-08-19 DIAGNOSIS — S90561A Insect bite (nonvenomous), right ankle, initial encounter: Secondary | ICD-10-CM | POA: Diagnosis present

## 2023-08-19 DIAGNOSIS — Z7982 Long term (current) use of aspirin: Secondary | ICD-10-CM | POA: Insufficient documentation

## 2023-08-19 DIAGNOSIS — W57XXXA Bitten or stung by nonvenomous insect and other nonvenomous arthropods, initial encounter: Secondary | ICD-10-CM | POA: Diagnosis not present

## 2023-08-19 MED ORDER — EPINEPHRINE 0.3 MG/0.3ML IJ SOAJ: 0.3000 mg | INTRAMUSCULAR | 1 refills | Status: DC | PRN

## 2023-08-19 MED ORDER — DIPHENHYDRAMINE HCL 25 MG PO CAPS
25.0000 mg | ORAL_CAPSULE | Freq: Once | ORAL | Status: AC
  Administered 2023-08-19: 25 mg via ORAL
  Filled 2023-08-19: qty 1

## 2023-08-19 NOTE — Discharge Instructions (Addendum)
You may use Benadryl every 4-6 hours as needed for itching or rash.  If you develop new or worsening rash, itching, or if you develop lip/tongue swelling, shortness of breath, or any other new/concerning symptoms, then return to the ER or call 911.

## 2023-08-19 NOTE — ED Provider Notes (Signed)
[place] EMERGENCY DEPARTMENT AT [facility] Provider Note   CSN: [id number] Arrival date & time: 08/19/23  1928     History  Chief Complaint  Patient presents with   Allergic Reaction    Kathleen [name] is a 62 y.o. female.  HPI 62 year old female presents with concern for allergic reaction.  She was walking and got bit by something on her right ankle.  She is not sure what it was but was very concerned because she is allergic to different venoms.  This occurred shortly before arrival to the ED.  She states that she had a diffuse rash/hives including on her chest and legs.  She was very itchy.  She felt like her lips were swelling.  She had some trouble breathing.  Triage note indicates chest pain but she states there was no chest pain but rather the rash.  Both of her legs started swelling which is atypical.  She never got her EpiPen refilled and so was not able to treat herself and came here.  She has had no treatment but in the hour and a half since she has been here she states that her leg swelling has gone down and the rash has essentially resolved.  Lip/facial swelling is better.  She feels drowsy but has not yet taken any meds but otherwise feels normal. Scalp still feels itchy, is asking for some benadryl.  Home Medications Prior to Admission medications   Medication Sig Start Date End Date Taking? Authorizing Provider  EPINEPHrine 0.3 mg/0.3 mL IJ SOAJ injection Inject 0.3 mg into the muscle as needed for anaphylaxis. 08/19/23  Yes Pricilla Loveless, MD  acetaminophen (TYLENOL) 160 MG chewable tablet Chew 500 mg by mouth every 6 (six) hours as needed for pain.    [provider]  albuterol (PROVENTIL) (2.5 MG/3ML) 0.083% nebulizer solution Use 1 vial (2.5 mg total) by nebulization every 4 (four) hours as needed for wheezing or shortness of breath. 04/08/23   Nooruddin, Jason Fila, MD  albuterol (VENTOLIN HFA) 108 (90 Base) MCG/ACT inhaler Inhale 2 puffs into the lungs  every 6 (six) hours as needed for wheezing or shortness of breath. 04/08/23   Nooruddin, Jason Fila, MD  aspirin EC 81 MG tablet Take 81 mg by mouth daily. Swallow whole.    [provider]  benzonatate (TESSALON PERLES) 100 MG capsule Take 1 capsule (100 mg total) by mouth every 6 (six) hours as needed for cough. 03/10/23 03/09/24  Leatha Gilding, MD  Cyanocobalamin (B-12 PO) Take 1 tablet by mouth daily.    [provider]  DULoxetine (CYMBALTA) 30 MG capsule Take 1 capsule (30 mg total) by mouth daily. 05/19/23 05/18/24  Nooruddin, Jason Fila, MD  fluticasone (FLOVENT HFA) 110 MCG/ACT inhaler Inhale 2 puffs into the lungs 2 (two) times daily. 03/10/23 03/09/24  Russella Dar, NP  guaiFENesin-dextromethorphan (ROBITUSSIN DM) 100-10 MG/5ML syrup Take 10 mLs by mouth every 4 (four) hours as needed for cough. 03/10/23   Leatha Gilding, MD  hydrOXYzine (ATARAX) 25 MG tablet Take 1 tablet (25 mg total) by mouth 3 (three) times daily as needed for anxiety. 05/19/23   Nooruddin, Jason Fila, MD  ipratropium-albuterol (DUONEB) 0.5-2.5 (3) MG/3ML SOLN Take 3 mLs by nebulization every 2 (two) hours as needed. 05/19/23   Nooruddin, Jason Fila, MD  lidocaine (LIDODERM) 5 % Place 1 patch onto the skin daily. Remove & Discard patch within 12 hours or as directed by MD 02/01/23   Rhetta Mura, MD  loratadine (CLARITIN)  10 MG tablet Take 1 tablet (10 mg total) by mouth daily. 02/01/23   Rhetta Mura, MD  montelukast (SINGULAIR) 10 MG tablet Take 1 tablet (10 mg total) by mouth at bedtime. Patient not taking: Reported on 04/07/2023 02/01/23   Rhetta Mura, MD  Multiple Vitamin (MULTIVITAMIN PO) Take 1 tablet by mouth daily.    [provider]  nicotine (NICODERM CQ - DOSED IN MG/24 HOURS) 21 mg/24hr patch Place 1 patch (21 mg total) onto the skin daily. 03/10/23 03/09/24  Leatha Gilding, MD  ondansetron (ZOFRAN) 4 MG tablet TAKE 1 TABLET BY MOUTH ONCE DAILY AS NEEDED FOR NAUSEA FOR VOMITING 05/19/23    Nooruddin, Jason Fila, MD  pantoprazole (PROTONIX) 40 MG tablet Take 1 tablet (40 mg total) by mouth 2 (two) times daily before a meal. Patient taking differently: Take 40 mg by mouth daily. 02/01/23   Rhetta Mura, MD  TRELEGY ELLIPTA 200-62.5-25 MCG/ACT AEPB Inhale 1 puff into the lungs daily. 05/19/23   Nooruddin, Jason Fila, MD      Allergies    Bee venom, Peach [prunus persica], Topamax [topiramate], and Tramadol    Review of Systems   Review of Systems  HENT:  Positive for facial swelling.   Respiratory:  Negative for chest tightness and shortness of breath.   Cardiovascular:  Positive for leg swelling. Negative for chest pain.  Skin:  Positive for rash.    Physical Exam Updated Vital Signs BP 100/74   Pulse 93   Temp 98.2 F (36.8 C) (Oral)   Resp (!) 24   Ht 5\' 3"  (1.6 m)   Wt 54.4 kg   LMP 11/08/2014   SpO2 99%   BMI 21.26 kg/m  Physical Exam Vitals and nursing note reviewed.  Constitutional:      General: She is not in acute distress.    Appearance: She is well-developed. She is not ill-appearing or diaphoretic.  HENT:     Head: Normocephalic and atraumatic.     Mouth/Throat:     Comments: Clear oropharynx. No lip/tongue swelling Cardiovascular:     Rate and Rhythm: Normal rate and regular rhythm.     Heart sounds: Normal heart sounds.  Pulmonary:     Effort: Pulmonary effort is normal.     Breath sounds: Normal breath sounds. No stridor. No wheezing.  Abdominal:     General: There is no distension.  Musculoskeletal:     Comments: Questionable ankle swelling bilaterally. No obvious bites.  Skin:    General: Skin is warm and dry.     Findings: No rash (no hives).  Neurological:     Mental Status: She is alert.     ED Results / Procedures / Treatments   Labs (all labs ordered are listed, but only abnormal results are displayed) Labs Reviewed - No data to display  EKG None  Radiology No results found.  Procedures Procedures    Medications  Ordered in ED Medications  diphenhydrAMINE (BENADRYL) capsule 25 mg (25 mg Oral Given 08/19/23 2128)    ED Course/ Medical Decision Making/ A&P                                 Medical Decision Making Risk Prescription drug management.   Patient presents with symptoms consistent with an allergic reaction.  However all of her symptoms have resolved besides some residual mild itching to her scalp.  She was watched after some Benadryl and feels  a lot better.  No signs or symptoms of anaphylaxis at this time.  There was a question of possible chest pain in triage but no chest pain when I am talking to the patient.  Given she feels well I think is reasonable to discharge and she is requesting an EpiPen prescription as she has lost hers.  Otherwise will discharge home with return precautions.        Final Clinical Impression(s) / ED Diagnoses Final diagnoses:  Insect bite of right ankle, initial encounter    Rx / DC Orders ED Discharge Orders          Ordered    EPINEPHrine 0.3 mg/0.3 mL IJ SOAJ injection  As needed        08/19/23 2218              Pricilla Loveless, MD 08/19/23 2241

## 2023-08-19 NOTE — ED Triage Notes (Signed)
Pt reports getting bit by "something" and reports she is swelling and states it is hard to swallow now. Pt does have swelling in her legs and knees. Pt reports chest pains as well.

## 2023-08-19 NOTE — ED Notes (Signed)
Patient speaking in complete sentences, in NAD.  Patient requesting something to drink.

## 2023-11-14 ENCOUNTER — Encounter: Payer: 59 | Admitting: Student

## 2023-11-25 ENCOUNTER — Other Ambulatory Visit: Payer: Self-pay

## 2023-11-25 ENCOUNTER — Encounter (HOSPITAL_COMMUNITY): Payer: Self-pay

## 2023-11-25 ENCOUNTER — Emergency Department (HOSPITAL_COMMUNITY)
Admission: EM | Admit: 2023-11-25 | Discharge: 2023-11-25 | Disposition: A | Discharge status: Home / Self Care | Payer: 59 | Attending: Emergency Medicine | Admitting: Emergency Medicine

## 2023-11-25 ENCOUNTER — Emergency Department (HOSPITAL_COMMUNITY): Payer: 59

## 2023-11-25 DIAGNOSIS — Z7951 Long term (current) use of inhaled steroids: Secondary | ICD-10-CM | POA: Insufficient documentation

## 2023-11-25 DIAGNOSIS — J45901 Unspecified asthma with (acute) exacerbation: Secondary | ICD-10-CM | POA: Insufficient documentation

## 2023-11-25 DIAGNOSIS — Z8673 Personal history of transient ischemic attack (TIA), and cerebral infarction without residual deficits: Secondary | ICD-10-CM | POA: Insufficient documentation

## 2023-11-25 DIAGNOSIS — R0602 Shortness of breath: Secondary | ICD-10-CM | POA: Diagnosis present

## 2023-11-25 DIAGNOSIS — Z1152 Encounter for screening for COVID-19: Secondary | ICD-10-CM | POA: Diagnosis not present

## 2023-11-25 DIAGNOSIS — Z7982 Long term (current) use of aspirin: Secondary | ICD-10-CM | POA: Insufficient documentation

## 2023-11-25 LAB — CBC WITH DIFFERENTIAL/PLATELET
Abs Immature Granulocytes: 0.02 10*3/uL (ref 0.00–0.07)
Basophils Absolute: 0 10*3/uL (ref 0.0–0.1)
Basophils Relative: 1 %
Eosinophils Absolute: 0.4 10*3/uL (ref 0.0–0.5)
Eosinophils Relative: 5 %
HCT: 42.4 % (ref 36.0–46.0)
Hemoglobin: 13.8 g/dL (ref 12.0–15.0)
Immature Granulocytes: 0 %
Lymphocytes Relative: 39 %
Lymphs Abs: 2.6 10*3/uL (ref 0.7–4.0)
MCH: 28.5 pg (ref 26.0–34.0)
MCHC: 32.5 g/dL (ref 30.0–36.0)
MCV: 87.6 fL (ref 80.0–100.0)
Monocytes Absolute: 0.7 10*3/uL (ref 0.1–1.0)
Monocytes Relative: 11 %
Neutro Abs: 2.9 10*3/uL (ref 1.7–7.7)
Neutrophils Relative %: 44 %
Platelets: 271 10*3/uL (ref 150–400)
RBC: 4.84 MIL/uL (ref 3.87–5.11)
RDW: 13.3 % (ref 11.5–15.5)
WBC: 6.6 10*3/uL (ref 4.0–10.5)
nRBC: 0 % (ref 0.0–0.2)

## 2023-11-25 LAB — BASIC METABOLIC PANEL
Anion gap: 5 (ref 5–15)
BUN: 15 mg/dL (ref 8–23)
CO2: 27 mmol/L (ref 22–32)
Calcium: 8.3 mg/dL — ABNORMAL LOW (ref 8.9–10.3)
Chloride: 103 mmol/L (ref 98–111)
Creatinine, Ser: 0.96 mg/dL (ref 0.44–1.00)
GFR, Estimated: >60 mL/min (ref >60)
Glucose, Bld: 100 mg/dL — ABNORMAL HIGH (ref 70–99)
Potassium: 4 mmol/L (ref 3.5–5.1)
Sodium: 135 mmol/L (ref 135–145)

## 2023-11-25 LAB — RESP PANEL BY RT-PCR (RSV, FLU A&B, COVID)  RVPGX2
Influenza A by PCR: NEGATIVE
Influenza B by PCR: NEGATIVE
Resp Syncytial Virus by PCR: NEGATIVE
SARS Coronavirus 2 by RT PCR: NEGATIVE

## 2023-11-25 MED ORDER — ACETAMINOPHEN 500 MG PO TABS
1000.0000 mg | ORAL_TABLET | Freq: Once | ORAL | Status: AC
  Administered 2023-11-25: 1000 mg via ORAL
  Filled 2023-11-25: qty 2

## 2023-11-25 MED ORDER — PREDNISONE 20 MG PO TABS: 40.0000 mg | ORAL_TABLET | Freq: Every day | ORAL | 0 refills | Status: AC

## 2023-11-25 MED ORDER — ALBUTEROL SULFATE (2.5 MG/3ML) 0.083% IN NEBU
INHALATION_SOLUTION | RESPIRATORY_TRACT | Status: AC
  Filled 2023-11-25: qty 12

## 2023-11-25 MED ORDER — ALBUTEROL SULFATE (2.5 MG/3ML) 0.083% IN NEBU
10.0000 mg/h | INHALATION_SOLUTION | Freq: Once | RESPIRATORY_TRACT | Status: AC
  Administered 2023-11-25: 10 mg/h via RESPIRATORY_TRACT
  Filled 2023-11-25: qty 12

## 2023-11-25 MED ORDER — MAGNESIUM SULFATE 2 GM/50ML IV SOLN
2.0000 g | Freq: Once | INTRAVENOUS | Status: AC
  Administered 2023-11-25: 2 g via INTRAVENOUS
  Filled 2023-11-25: qty 50

## 2023-11-25 MED ORDER — ALBUTEROL SULFATE HFA 108 (90 BASE) MCG/ACT IN AERS
2.0000 | INHALATION_SPRAY | Freq: Once | RESPIRATORY_TRACT | Status: DC
  Filled 2023-11-25: qty 6.7

## 2023-11-25 MED ORDER — METHYLPREDNISOLONE SODIUM SUCC 125 MG IJ SOLR
125.0000 mg | Freq: Once | INTRAMUSCULAR | Status: AC
  Administered 2023-11-25: 125 mg via INTRAVENOUS
  Filled 2023-11-25: qty 2

## 2023-11-25 MED ORDER — ALBUTEROL SULFATE (2.5 MG/3ML) 0.083% IN NEBU
10.0000 mg/h | INHALATION_SOLUTION | Freq: Once | RESPIRATORY_TRACT | Status: AC
  Administered 2023-11-25: 10 mg/h via RESPIRATORY_TRACT

## 2023-11-25 MED ORDER — IPRATROPIUM-ALBUTEROL 0.5-2.5 (3) MG/3ML IN SOLN
3.0000 mL | RESPIRATORY_TRACT | Status: AC
  Administered 2023-11-25 (×2): 3 mL via RESPIRATORY_TRACT
  Filled 2023-11-25 (×2): qty 3

## 2023-11-25 NOTE — Progress Notes (Signed)
PT spilt neb cup shortly after neb started will speak with MD for possible reorder.

## 2023-11-25 NOTE — ED Provider Triage Note (Signed)
Emergency Medicine Provider Triage Evaluation Note  Kathleen [name] , a 62 y.o. female  was evaluated in triage.  Pt complains of shortness of breath and cough.  She has history of asthma and has not had a flare up in a long time.  She reports she ran out of her inhalers awhile ago.  Symptoms began last night and are progressively worsening.   Review of Systems  Positive: As above Negative: As above  Physical Exam  BP (!) 131/115 (BP Location: Right Arm)   Pulse (!) 113   Temp 98.2 F (36.8 C) (Oral)   Resp 18   LMP 11/08/2014   SpO2 98%  Gen:   Awake, no distress   Resp:  Significant wheezing in all lung fields, breath sounds diminished in bases; cough appreciated during exam MSK:   Moves extremities without difficulty  Other:    Medical Decision Making  Medically screening exam initiated at 3:15 PM.  Appropriate orders placed.  Kathleen [name] was informed that the remainder of the evaluation will be completed by another provider, this initial triage assessment does not replace that evaluation, and the importance of remaining in the ED until their evaluation is complete.     Melton Alar R, PA-C 11/25/23 1517

## 2023-11-25 NOTE — Progress Notes (Signed)
Received call from ED that PT has CAT ordered but PT is currently eating a sandwich. RT asked if PT in distress- staff responded "no." RT will deliver CAT shortly.

## 2023-11-25 NOTE — ED Notes (Signed)
Pt ambulated and was able to maintain SPO2 > 95%. Pt began to complain of dizziness and appeared to have some increased work of breathing once returning to her room, but stated she was feeling better and was ready to be discharged.

## 2023-11-25 NOTE — ED Notes (Signed)
Respiratory called and they advised they would come start the hour - long neb treatment.

## 2023-11-25 NOTE — Discharge Instructions (Signed)
Your workup was reassuring.  You are feeling better after medications in the emergency department.  For any worsening symptoms return to the emergency room.  Follow-up with your PCP.

## 2023-11-25 NOTE — ED Notes (Signed)
Pt insisted on self swabbing for her COVID test. I informed her it was supposed to be more rounds around her nose, but she stated she's "been here before and they said I could do it".

## 2023-11-25 NOTE — ED Provider Notes (Signed)
[place] EMERGENCY DEPARTMENT AT [facility] Provider Note   CSN: [id number] Arrival date & time: 11/25/23  1506     History  Chief Complaint  Patient presents with   Asthma   Shortness of Breath    Kathleen [name] is a 62 y.o. female.  62 year old female presents today for concern of shortness of breath.  She states this started 2 days ago.  She has history of asthma.  States this feels like her typical asthma flareup.  Endorses wheezing, shortness of breath.  Denies productive cough, chest pain.  States she is out of her breathing treatment, or albuterol inhaler.  The history is provided by the patient. No language interpreter was used.       Home Medications Prior to Admission medications   Medication Sig Start Date End Date Taking? Authorizing Provider  acetaminophen (TYLENOL) 160 MG chewable tablet Chew 500 mg by mouth every 6 (six) hours as needed for pain.    [provider]  albuterol (PROVENTIL) (2.5 MG/3ML) 0.083% nebulizer solution Use 1 vial (2.5 mg total) by nebulization every 4 (four) hours as needed for wheezing or shortness of breath. 04/08/23   Nooruddin, Jason Fila, MD  albuterol (VENTOLIN HFA) 108 (90 Base) MCG/ACT inhaler Inhale 2 puffs into the lungs every 6 (six) hours as needed for wheezing or shortness of breath. 04/08/23   Nooruddin, Jason Fila, MD  aspirin EC 81 MG tablet Take 81 mg by mouth daily. Swallow whole.    [provider]  benzonatate (TESSALON PERLES) 100 MG capsule Take 1 capsule (100 mg total) by mouth every 6 (six) hours as needed for cough. 03/10/23 03/09/24  Leatha Gilding, MD  Cyanocobalamin (B-12 PO) Take 1 tablet by mouth daily.    [provider]  DULoxetine (CYMBALTA) 30 MG capsule Take 1 capsule (30 mg total) by mouth daily. 05/19/23 05/18/24  Nooruddin, Jason Fila, MD  EPINEPHrine 0.3 mg/0.3 mL IJ SOAJ injection Inject 0.3 mg into the muscle as needed for anaphylaxis. 08/19/23   Pricilla Loveless, MD  fluticasone  (FLOVENT HFA) 110 MCG/ACT inhaler Inhale 2 puffs into the lungs 2 (two) times daily. 03/10/23 03/09/24  Russella Dar, NP  guaiFENesin-dextromethorphan (ROBITUSSIN DM) 100-10 MG/5ML syrup Take 10 mLs by mouth every 4 (four) hours as needed for cough. 03/10/23   Leatha Gilding, MD  hydrOXYzine (ATARAX) 25 MG tablet Take 1 tablet (25 mg total) by mouth 3 (three) times daily as needed for anxiety. 05/19/23   Nooruddin, Jason Fila, MD  ipratropium-albuterol (DUONEB) 0.5-2.5 (3) MG/3ML SOLN Take 3 mLs by nebulization every 2 (two) hours as needed. 05/19/23   Nooruddin, Jason Fila, MD  lidocaine (LIDODERM) 5 % Place 1 patch onto the skin daily. Remove & Discard patch within 12 hours or as directed by MD 02/01/23   Rhetta Mura, MD  loratadine (CLARITIN) 10 MG tablet Take 1 tablet (10 mg total) by mouth daily. 02/01/23   Rhetta Mura, MD  montelukast (SINGULAIR) 10 MG tablet Take 1 tablet (10 mg total) by mouth at bedtime. Patient not taking: Reported on 04/07/2023 02/01/23   Rhetta Mura, MD  Multiple Vitamin (MULTIVITAMIN PO) Take 1 tablet by mouth daily.    [provider]  nicotine (NICODERM CQ - DOSED IN MG/24 HOURS) 21 mg/24hr patch Place 1 patch (21 mg total) onto the skin daily. 03/10/23 03/09/24  Leatha Gilding, MD  ondansetron (ZOFRAN) 4 MG tablet TAKE 1 TABLET BY MOUTH ONCE DAILY AS NEEDED FOR NAUSEA FOR VOMITING 05/19/23  Nooruddin, Jason Fila, MD  pantoprazole (PROTONIX) 40 MG tablet Take 1 tablet (40 mg total) by mouth 2 (two) times daily before a meal. Patient taking differently: Take 40 mg by mouth daily. 02/01/23   Rhetta Mura, MD  TRELEGY ELLIPTA 200-62.5-25 MCG/ACT AEPB Inhale 1 puff into the lungs daily. 05/19/23   Nooruddin, Jason Fila, MD      Allergies    Bee venom, Peach [prunus persica], Topamax [topiramate], and Tramadol    Review of Systems   Review of Systems  Constitutional:  Negative for chills and fever.  Respiratory:  Negative for shortness of breath.    Cardiovascular:  Negative for chest pain.  Gastrointestinal:  Negative for abdominal pain.  Neurological:  Negative for light-headedness.  All other systems reviewed and are negative.   Physical Exam Updated Vital Signs BP (!) 131/115 (BP Location: Right Arm)   Pulse (!) 113   Temp 98.2 F (36.8 C) (Oral)   Resp 18   Ht 5\' 3"  (1.6 m)   Wt 54.4 kg   LMP 11/08/2014   SpO2 98%   BMI 21.24 kg/m  Physical Exam Vitals and nursing note reviewed.  Constitutional:      General: She is not in acute distress.    Appearance: Normal appearance. She is not ill-appearing.  HENT:     Head: Normocephalic and atraumatic.     Nose: Nose normal.  Eyes:     General: No scleral icterus.    Extraocular Movements: Extraocular movements intact.     Conjunctiva/sclera: Conjunctivae normal.  Cardiovascular:     Rate and Rhythm: Normal rate and regular rhythm.     Heart sounds: Normal heart sounds.  Pulmonary:     Effort: Pulmonary effort is normal. No respiratory distress.     Breath sounds: Wheezing present.     Comments: Coarse lung sounds throughout Abdominal:     General: There is no distension.     Tenderness: There is no abdominal tenderness.  Musculoskeletal:        General: Normal range of motion.     Cervical back: Normal range of motion.  Skin:    General: Skin is warm and dry.  Neurological:     General: No focal deficit present.     Mental Status: She is alert. Mental status is at baseline.     ED Results / Procedures / Treatments   Labs (all labs ordered are listed, but only abnormal results are displayed) Labs Reviewed  BASIC METABOLIC PANEL - Abnormal; Notable for the following components:      Result Value   Glucose, Bld 100 (*)    Calcium 8.3 (*)    All other components within normal limits  RESP PANEL BY RT-PCR (RSV, FLU A&B, COVID)  RVPGX2  CBC WITH DIFFERENTIAL/PLATELET    EKG None  Radiology No results found.  Procedures Procedures    Medications  Ordered in ED Medications  albuterol (PROVENTIL) (2.5 MG/3ML) 0.083% nebulizer solution (has no administration in time range)  magnesium sulfate IVPB 2 g 50 mL (has no administration in time range)  methylPREDNISolone sodium succinate (SOLU-MEDROL) 125 mg/2 mL injection 125 mg (has no administration in time range)  ipratropium-albuterol (DUONEB) 0.5-2.5 (3) MG/3ML nebulizer solution 3 mL (3 mLs Nebulization Given 11/25/23 1558)    ED Course/ Medical Decision Making/ A&P Clinical Course as of 11/25/23 2017  Fri Nov 25, 2023  1758 Respiratory therapist notified me that patient disengaged the nebulizer towards the start of the treatment.  Only received  minimal amount so far.  Will reorder 10 mg of albuterol continuous neb treatment. [AA]    Clinical Course User Index [AA] Marita Kansas, PA-C                                 Medical Decision Making Risk OTC drugs. Prescription drug management.   Medical Decision Making / ED Course   This patient presents to the ED for concern of asthma exacerbation, this involves an extensive number of treatment options, and is a complaint that carries with it a high risk of complications and morbidity.  The differential diagnosis includes asthma exacerbation, viral URI, bronchitis, pneumonia, PE  MDM: 62 year old female with past medical history significant for asthma exacerbation presents today for concern of asthma exacerbation.  No home medications.  Wheezing and coarse lung sounds on exam.  Not hypoxic.  On baseline O2.  Will provide continuous neb, Solu-Medrol, and magnesium.  Will obtain blood work.  Will reevaluate.  Patient was ambulated in the emergency department.  Had minimal increase in work of breathing according to the nurse tech.  Despite this patient states she would like to be discharged.  Does not want to be admitted.  Return precaution discussed.  Albuterol inhaler given.  Patient voices understanding and is in agreement with  plan.  Lab Tests: -I ordered, reviewed, and interpreted labs.   The pertinent results include:   Labs Reviewed  BASIC METABOLIC PANEL - Abnormal; Notable for the following components:      Result Value   Glucose, Bld 100 (*)    Calcium 8.3 (*)    All other components within normal limits  RESP PANEL BY RT-PCR (RSV, FLU A&B, COVID)  RVPGX2  CBC WITH DIFFERENTIAL/PLATELET      EKG  EKG Interpretation Date/Time:    Ventricular Rate:    PR Interval:    QRS Duration:    QT Interval:    QTC Calculation:   R Axis:      Text Interpretation:           Imaging Studies ordered: I ordered imaging studies including chest x-ray I independently visualized and interpreted imaging. I agree with the radiologist interpretation   Medicines ordered and prescription drug management: Meds ordered this encounter  Medications   ipratropium-albuterol (DUONEB) 0.5-2.5 (3) MG/3ML nebulizer solution 3 mL   albuterol (PROVENTIL) (2.5 MG/3ML) 0.083% nebulizer solution   magnesium sulfate IVPB 2 g 50 mL   methylPREDNISolone sodium succinate (SOLU-MEDROL) 125 mg/2 mL injection 125 mg    IV methylprednisolone will be converted to either a q12h or q24h frequency with the same total daily dose (TDD).  Ordered Dose: 1 to 125 mg TDD; convert to: TDD q24h.  Ordered Dose: 126 to 250 mg TDD; convert to: TDD div q12h.  Ordered Dose: >250 mg TDD; DAW.    -I have reviewed the patients home medicines and have made adjustments as needed  Reevaluation: After the interventions noted above, I reevaluated the patient and found that they have :improved  Co morbidities that complicate the patient evaluation  Past Medical History:  Diagnosis Date   Acute respiratory failure with hypoxia and hypercapnia (HCC) 03/08/2023   Anxiety    Arthritis    hands   Asthma    2 sets of PFT's in 04/09 without sign variability. Last set with significant decrease in FEV1 with saline alone, suggesting Asthma but   recommended clinical corelation  Asthma    Asthma exacerbation 04/07/2023   Bipolar 1 disorder [facility])    therapist is Joy and is followed by [employer]   Blackout    negative work up including ESR, ANA, opthalmology referral, carotid dopplers, 2D echo, MRI and EEG.   BREAST LUMP 03/25/2008   Annotation: bilaterally Qualifier: Diagnosis of  By: Candis Musa MD, Ruben Reason.    Breast mass in female    s/p mammogram, u/s and biopsy in 05/09 c/w fibroadenoma,   Bronchitis    Chronic headache    Chronic low back pain 08/08/2008   Qualifier: Diagnosis of  By: Andrey Campanile  MD, Valerie     Chronic pain    normal work up including TSH, RPR, B12, HIV, plain films, 2 ESR's, ANA, CK, RF along with routine CBC, CMET and UA. Further work up includes CRP, ESR, SPEP/UPEP, hepatitis erology, A1C , repeat ANA   COPD (chronic obstructive pulmonary disease) (HCC)    COPD exacerbation (HCC) 03/31/2019   Depression    DUB (dysfunctional uterine bleeding)    and pelvic pain, with negative endometrial bx in 07/09 and transvaginal U/S significant for mild fibroids in 0/09.   Emphysema of lung (HCC)    GERD (gastroesophageal reflux disease)    Hallucinations 03/18/2008   Qualifier: Diagnosis of  By: Andrey Campanile  MD, Vikki Ports     Hyperlipidemia    Lower extremity edema    Neg ABI's, normal 2D echo, normal albumin   Menorrhagia    Ovarian cyst    Polysubstance abuse (HCC)    none since March 17,2009.   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  Return precaution discussed.  Patient voices understanding and is in agreement with plan.  Discussed admission but patient defers and would like to be discharged.  Marland Kitchen  Appears EKG was not obtained prior to patient's discharge.  She was not complaining of chest pain.  I did discuss this with the patient and asked if she  would stay long enough to allow [place] to obtain an EKG but she refused.  Final Clinical Impression(s) / ED Diagnoses Final diagnoses:  Moderate asthma with exacerbation, unspecified whether persistent    Rx / DC Orders ED Discharge Orders          Ordered    predniSONE (DELTASONE) 20 MG tablet  Daily with breakfast        11/25/23 2017              Marita Kansas, PA-C 11/25/23 2020    Lonell Grandchild, MD 11/25/23 2155

## 2023-11-26 ENCOUNTER — Other Ambulatory Visit (HOSPITAL_COMMUNITY): Payer: 59

## 2024-02-24 ENCOUNTER — Emergency Department (HOSPITAL_COMMUNITY)
Admission: EM | Admit: 2024-02-24 | Discharge: 2024-02-24 | Discharge status: Against Medical Advice | Attending: Emergency Medicine | Admitting: Emergency Medicine

## 2024-02-24 ENCOUNTER — Emergency Department (HOSPITAL_COMMUNITY)

## 2024-02-24 ENCOUNTER — Other Ambulatory Visit: Payer: Self-pay

## 2024-02-24 DIAGNOSIS — R519 Headache, unspecified: Secondary | ICD-10-CM | POA: Insufficient documentation

## 2024-02-24 DIAGNOSIS — R079 Chest pain, unspecified: Secondary | ICD-10-CM | POA: Diagnosis not present

## 2024-02-24 DIAGNOSIS — Z5321 Procedure and treatment not carried out due to patient leaving prior to being seen by health care provider: Secondary | ICD-10-CM | POA: Diagnosis not present

## 2024-02-24 DIAGNOSIS — J45909 Unspecified asthma, uncomplicated: Secondary | ICD-10-CM | POA: Insufficient documentation

## 2024-02-24 DIAGNOSIS — R0602 Shortness of breath: Secondary | ICD-10-CM | POA: Diagnosis present

## 2024-02-24 DIAGNOSIS — M7989 Other specified soft tissue disorders: Secondary | ICD-10-CM | POA: Insufficient documentation

## 2024-02-24 LAB — CBC
HCT: 39 % (ref 36.0–46.0)
Hemoglobin: 12.3 g/dL (ref 12.0–15.0)
MCH: 27.8 pg (ref 26.0–34.0)
MCHC: 31.5 g/dL (ref 30.0–36.0)
MCV: 88 fL (ref 80.0–100.0)
Platelets: 266 10*3/uL (ref 150–400)
RBC: 4.43 MIL/uL (ref 3.87–5.11)
RDW: 13.4 % (ref 11.5–15.5)
WBC: 6.9 10*3/uL (ref 4.0–10.5)
nRBC: 0 % (ref 0.0–0.2)

## 2024-02-24 LAB — BRAIN NATRIURETIC PEPTIDE: B Natriuretic Peptide: 66.2 pg/mL (ref 0.0–100.0)

## 2024-02-24 LAB — TROPONIN I (HIGH SENSITIVITY)
Troponin I (High Sensitivity): 3 ng/L (ref <18)
Troponin I (High Sensitivity): 3 ng/L (ref <18)

## 2024-02-24 LAB — BASIC METABOLIC PANEL
Anion gap: 7 (ref 5–15)
BUN: 17 mg/dL (ref 8–23)
CO2: 27 mmol/L (ref 22–32)
Calcium: 8.7 mg/dL — ABNORMAL LOW (ref 8.9–10.3)
Chloride: 104 mmol/L (ref 98–111)
Creatinine, Ser: 1.05 mg/dL — ABNORMAL HIGH (ref 0.44–1.00)
GFR, Estimated: 60 mL/min — ABNORMAL LOW (ref >60)
Glucose, Bld: 87 mg/dL (ref 70–99)
Potassium: 3.8 mmol/L (ref 3.5–5.1)
Sodium: 138 mmol/L (ref 135–145)

## 2024-02-24 NOTE — ED Provider Triage Note (Cosign Needed)
 Emergency Medicine Provider Triage Evaluation Note  Kathleen [name] , a 63 y.o. female  was evaluated in triage.  Pt complains of headache, swollen feet bilaterally, shortness of breath on exertion, intermittent chest pain x 3 days ago. Out of albuterol inhaler.   Denies fevers, cough, congestion, abdominal pain, n/v/d  Review of Systems  Positive: N/a Negative: N/a  Physical Exam  BP (!) 155/93 (BP Location: Right Arm)   Pulse 80   Temp 98.2 F (36.8 C) (Oral)   Resp 18   Ht 5\' 3"  (1.6 m)   Wt 54.4 kg   LMP 11/08/2014   SpO2 100%   BMI 21.26 kg/m  Gen:   Awake, no distress   Resp:  Normal effort  MSK:   Moves extremities without difficulty  Other:    Medical Decision Making  Medically screening exam initiated at 6:52 PM.  Appropriate orders placed.  Kathleen [name] was informed that the remainder of the evaluation will be completed by another provider, this initial triage assessment does not replace that evaluation, and the importance of remaining in the ED until their evaluation is complete.     Lunette Stands, [place] 02/24/24 [phone]

## 2024-02-24 NOTE — ED Notes (Signed)
 Patient stated she can't wait anymore and willing to sign AMA papers.

## 2024-02-24 NOTE — ED Triage Notes (Signed)
 Pt arrives POV with c/o SHOB, bilateral leg swelling for 3 days. Pt has a hx of asthma, ran out of albuterol.

## 2024-03-01 ENCOUNTER — Ambulatory Visit: Payer: Self-pay

## 2024-03-01 NOTE — Telephone Encounter (Signed)
 Copied from CRM [id number]. Topic: Clinical - Red Word Triage >> Mar 01, 2024  3:08 PM Corin V wrote: [facility] that prompted transfer to Nurse Triage: Patient is having significant throbbing back pain. Rating 10/10. States it is "off the chain" and is hardly able to stand up after sitting. Would like lidocaine patches used previously.   Chief Complaint: Chronic low back pain. Asking for refill of Lidocaine patches. Symptoms: Pain, radiates down both legs Frequency: "For years after a fall." Pertinent Negatives: Patient denies  Disposition: [] ED /[] Urgent Care (no appt availability in office) / [] Appointment(In office/virtual)/ []  [facility]/ [] Home Care/ [] Refused Recommended Disposition /[] [facility] [facility]/ [x]  Follow-up with PCP Additional Notes: Please advise about refill, has appointment next week.  Reason for Disposition  Back pain present > 2 weeks  Answer Assessment - Initial Assessment Questions 1. ONSET: "When did the pain begin?"      This week 2. LOCATION: "Where does it hurt?" (upper, mid or lower back)     Lower 3. SEVERITY: "How bad is the pain?"  (e.g., Scale 1-10; mild, moderate, or severe)   - MILD (1-3): Doesn't interfere with normal activities.    - MODERATE (4-7): Interferes with normal activities or awakens from sleep.    - SEVERE (8-10): Excruciating pain, unable to do any normal activities.      Now - 10 4. PATTERN: "Is the pain constant?" (e.g., yes, no; constant, intermittent)      Constant 5. RADIATION: "Does the pain shoot into your legs or somewhere else?"     Both legs 6. CAUSE:  "What do you think is causing the back pain?"      Fall 7. BACK OVERUSE:  "Any recent lifting of heavy objects, strenuous work or exercise?"     No 8. MEDICINES: "What have you taken so far for the pain?" (e.g., nothing, acetaminophen, NSAIDS)     Lidocaine 9. NEUROLOGIC SYMPTOMS: "Do you have any weakness, numbness, or problems with bowel/bladder control?"      No 10. OTHER SYMPTOMS: "Do you have any other symptoms?" (e.g., fever, abdomen pain, burning with urination, blood in urine)       Urinary incontinence  11. PREGNANCY: "Is there any chance you are pregnant?" "When was your last menstrual period?"       No  Protocols used: Back Pain-A-AH

## 2024-03-06 ENCOUNTER — Ambulatory Visit (INDEPENDENT_AMBULATORY_CARE_PROVIDER_SITE_OTHER): Payer: Self-pay | Admitting: Internal Medicine

## 2024-03-06 VITALS — BP 111/79 | Temp 98.5°F | Ht 63.0 in | Wt 127.0 lb

## 2024-03-06 DIAGNOSIS — M533 Sacrococcygeal disorders, not elsewhere classified: Secondary | ICD-10-CM

## 2024-03-06 DIAGNOSIS — K1120 Sialoadenitis, unspecified: Secondary | ICD-10-CM | POA: Diagnosis not present

## 2024-03-06 DIAGNOSIS — M5416 Radiculopathy, lumbar region: Secondary | ICD-10-CM | POA: Diagnosis not present

## 2024-03-06 DIAGNOSIS — J4489 Other specified chronic obstructive pulmonary disease: Secondary | ICD-10-CM | POA: Diagnosis not present

## 2024-03-06 DIAGNOSIS — R32 Unspecified urinary incontinence: Secondary | ICD-10-CM

## 2024-03-06 LAB — POCT URINALYSIS DIPSTICK
Bilirubin, UA: NEGATIVE
Blood, UA: NEGATIVE
Glucose, UA: NEGATIVE
Ketones, UA: NEGATIVE
Nitrite, UA: NEGATIVE
Protein, UA: NEGATIVE
Spec Grav, UA: >=1.03 — AB (ref 1.010–1.025)
Urobilinogen, UA: 0.2 U/dL
pH, UA: 6 (ref 5.0–8.0)

## 2024-03-06 MED ORDER — LIDOCAINE 5 % EX PTCH: 1.0000 | MEDICATED_PATCH | CUTANEOUS | 5 refills | Status: DC

## 2024-03-06 NOTE — Patient Instructions (Signed)
 I will call with the results of your urine studies.

## 2024-03-06 NOTE — Progress Notes (Unsigned)
 Established Patient Office Visit  Subjective   Patient ID: Kathleen [name], female    DOB: 11/17/1961  Age: 63 y.o. MRN: [id number]  Chief Complaint  Patient presents with   Medication Refill   Back Pain   [name] arrived late to her appointment today for follow up from an ED visit but also with some acute complaints, so I tried to work her in to see.  She was seen on 3/14 in the ED for SOB and basic workup was started however she left AMA without being seen.  I reviewed the labs and personally reviewed the EKG and CXR.  EKG was nsr without ischemic changes and CXR was grossly clear.  Labs unremarkable including BNP, CBC, and Troponin x2.  She feels that was likely an asthma exacerbation and has already resolved since then.  On chart review she was also seen for an asthma exacerbation in December and had an ED visit on September for an insect bite.  She was last seen here in June of 2024.  Her current complaint is urinary incontinence.  Notes that it has been ongoing for about a month.  Denies any buring, fever, or chills.  She is sexually active with a single partner and does not want any STD testing.  She has a history of childbirth x2. One vaginal and the other ceasaran.  She does have some lower abdominal pressure.  She has never heard of or tried kegel/pelvic floor exercises.  She notes a history of "throat stones" from her chart I see a history of siloadenitis, she has previously seen Dr Azucena Fall[facility] of [facility] for this, she could not remember his name but did recall after I reviewed it with her, she feels she has a stone again and wants to go back to see him.       Objective:     BP 111/79 (BP Location: Right Arm, Patient Position: Sitting, Cuff Size: Small)   Temp 98.5 F (36.9 C) (Oral)   Ht 5\' 3"  (1.6 m)   Wt 127 lb (57.6 kg)   LMP 11/08/2014   SpO2 99%   BMI 22.50 kg/m  BP Readings from Last 3 Encounters:  03/06/24 111/79  02/24/24 (!) 155/93  11/25/23 112/86   Wt  Readings from Last 3 Encounters:  03/06/24 127 lb (57.6 kg)  02/24/24 120 lb (54.4 kg)  11/25/23 119 lb 14.9 oz (54.4 kg)      Physical Exam Constitutional:      Appearance: Normal appearance.  HENT:     Mouth/Throat:     Mouth: Mucous membranes are moist. No oral lesions.     Tonsils: No tonsillar exudate or tonsillar abscesses.  Cardiovascular:     Rate and Rhythm: Normal rate and regular rhythm.  Pulmonary:     Effort: Pulmonary effort is normal.     Breath sounds: Normal breath sounds.  Musculoskeletal:     Right lower leg: Edema (trace) present.     Left lower leg: Edema (trace) present.      Results for orders placed or performed in visit on 03/06/24  Microscopic Examination   Urine  Result Value Ref Range   WBC, UA 0-5 0 - 5 /hpf   RBC, Urine None seen 0 - 2 /hpf   Epithelial Cells (non renal) 0-10 0 - 10 /hpf   Casts None seen None seen /lpf   Bacteria, UA None seen None seen/Few  Urinalysis, Reflex Microscopic  Result Value Ref Range  Specific Gravity, UA 1.025 1.005 - 1.030   pH, UA 5.5 5.0 - 7.5   Color, UA Yellow Yellow   Appearance Ur Clear Clear   Leukocytes,UA Trace (A) Negative   Protein,UA Negative Negative/Trace   Glucose, UA Negative Negative   Ketones, UA Negative Negative   RBC, UA Negative Negative   Bilirubin, UA Negative Negative   Urobilinogen, Ur 0.2 0.2 - 1.0 mg/dL   Nitrite, UA Negative Negative   Microscopic Examination See below:   POCT Urinalysis Dipstick (14782)  Result Value Ref Range   Color, UA Yellow    Clarity, UA Slightly Cloudy    Glucose, UA Negative Negative   Bilirubin, UA Negative    Ketones, UA Negative    Spec Grav, UA >=1.030 (A) 1.010 - 1.025   Blood, UA Negative    pH, UA 6.0 5.0 - 8.0   Protein, UA Negative Negative   Urobilinogen, UA 0.2 0.2 or 1.0 E.U./dL   Nitrite, UA Negative    Leukocytes, UA Trace (A) Negative    Last CBC Lab Results  Component Value Date   WBC 6.9 02/24/2024   HGB 12.3  02/24/2024   HCT 39.0 02/24/2024   MCV 88.0 02/24/2024   MCH 27.8 02/24/2024   RDW 13.4 02/24/2024   PLT 266 02/24/2024   Last metabolic panel Lab Results  Component Value Date   GLUCOSE 87 02/24/2024   NA 138 02/24/2024   K 3.8 02/24/2024   CL 104 02/24/2024   CO2 27 02/24/2024   BUN 17 02/24/2024   CREATININE 1.05 (H) 02/24/2024   GFRNONAA 60 (L) 02/24/2024   CALCIUM 8.7 (L) 02/24/2024   PROT 5.7 (L) 04/07/2023   ALBUMIN 3.2 (L) 04/07/2023   LABGLOB 2.1 08/09/2019   AGRATIO 2.0 08/09/2019   BILITOT 0.6 04/07/2023   ALKPHOS 79 04/07/2023   AST 30 04/07/2023   ALT 32 04/07/2023   ANIONGAP 7 02/24/2024      The ASCVD Risk score (Arnett DK, et al., 2019) failed to calculate for the following reasons:   Risk score cannot be calculated because patient has a medical history suggesting prior/existing ASCVD    Assessment & Plan:   Problem List Items Addressed This Visit       Respiratory   Asthma-COPD overlap syndrome (HCC)   No current exacerbation, advised to continue inhalers        Digestive   Sialoadenitis   Did not visualize a stone/tonsilar obstruction but will place referral back to [facility]NT.      Relevant Orders   Ambulatory referral to ENT     Nervous and Auditory   Lumbar radiculopathy   Requested refill of lidocaine patches.        Other   Urinary incontinence - Primary   Short timeline of a month, will want to rule out infection.  POC UA with trace leuk, will get U/A and culture.  Reviewed med list and no new diuretics, serum glucose normal.  Provided some education about female urinary incontinuence and to try timed voiding and kegel exercises discussed if no infection and no improvement we can refer for pelvic floor PT.      Relevant Orders   POCT Urinalysis Dipstick (81002) (Completed)   Urinalysis, Reflex Microscopic (Completed)   Culture, Urine   Other Visit Diagnoses       Sacral back pain       Relevant Medications   lidocaine  (LIDODERM) 5 %       Return  in about 6 months (around 09/06/2024), or if symptoms worsen or fail to improve.    Gust Rung, DO

## 2024-03-07 ENCOUNTER — Telehealth: Payer: Self-pay | Admitting: *Deleted

## 2024-03-07 DIAGNOSIS — R32 Unspecified urinary incontinence: Secondary | ICD-10-CM | POA: Insufficient documentation

## 2024-03-07 LAB — URINALYSIS, ROUTINE W REFLEX MICROSCOPIC
Bilirubin, UA: NEGATIVE
Glucose, UA: NEGATIVE
Ketones, UA: NEGATIVE
Nitrite, UA: NEGATIVE
Protein,UA: NEGATIVE
RBC, UA: NEGATIVE
Specific Gravity, UA: 1.025 (ref 1.005–1.030)
Urobilinogen, Ur: 0.2 mg/dL (ref 0.2–1.0)
pH, UA: 5.5 (ref 5.0–7.5)

## 2024-03-07 LAB — MICROSCOPIC EXAMINATION
Bacteria, UA: NONE SEEN
Casts: NONE SEEN /LPF
RBC, Urine: NONE SEEN /HPF (ref 0–2)

## 2024-03-07 NOTE — Telephone Encounter (Signed)
 Copied from CRM [id number]. Topic: Clinical - Lab/Test Results >> Mar 07, 2024  3:40 PM Everette Rank wrote: Reason for CRM: Patient calling on update for Lab results from 03/25. Needs cal back  [phone]

## 2024-03-07 NOTE — Assessment & Plan Note (Signed)
 Did not visualize a stone/tonsilar obstruction but will place referral back to [facility] ENT.

## 2024-03-07 NOTE — Assessment & Plan Note (Signed)
 No current exacerbation, advised to continue inhalers

## 2024-03-07 NOTE — Assessment & Plan Note (Signed)
 Requested refill of lidocaine patches.

## 2024-03-07 NOTE — Assessment & Plan Note (Signed)
 Short timeline of a month, will want to rule out infection.  POC UA with trace leuk, will get U/A and culture.  Reviewed med list and no new diuretics, serum glucose normal.  Provided some education about female urinary incontinuence and to try timed voiding and kegel exercises discussed if no infection and no improvement we can refer for pelvic floor PT.

## 2024-03-08 ENCOUNTER — Telehealth: Payer: Self-pay

## 2024-03-08 LAB — URINE CULTURE: Organism ID, Bacteria: NO GROWTH

## 2024-03-08 NOTE — Telephone Encounter (Signed)
 Prior Authorization for patient (Lidocaine 5% patches) came through on cover my meds was submitted with last office notes awaiting approval or denial.  [id number]

## 2024-03-08 NOTE — Telephone Encounter (Signed)
 Iona Coach (Key: [facility]) PA Case ID #: [id number] Rx #: [id number] Need Help? Call us at [phone] Outcome Approved today by [facility] 2017 NCPDP Request Reference Number: [id number]. LIDOCAINE PAD 5% is approved through 03/08/2025. For further questions, call [employer] at [phone]. Effective Date: 03/08/2024 Authorization Expiration Date: 03/08/2025 Drug Lidocaine 5% patches ePA cloud logo Form [employer] Electronic Prior Authorization Form [phone] NCPDP) Original Claim Info [phone]

## 2024-03-13 NOTE — Telephone Encounter (Signed)
 Updated patient by phone last friday

## 2024-04-17 ENCOUNTER — Other Ambulatory Visit: Payer: Self-pay

## 2024-04-17 ENCOUNTER — Emergency Department (HOSPITAL_COMMUNITY)
Admission: EM | Admit: 2024-04-17 | Discharge: 2024-04-17 | Disposition: A | Discharge status: Home / Self Care | Attending: Emergency Medicine | Admitting: Emergency Medicine

## 2024-04-17 ENCOUNTER — Emergency Department (HOSPITAL_COMMUNITY)

## 2024-04-17 DIAGNOSIS — J4520 Mild intermittent asthma, uncomplicated: Secondary | ICD-10-CM

## 2024-04-17 DIAGNOSIS — Z7982 Long term (current) use of aspirin: Secondary | ICD-10-CM | POA: Diagnosis not present

## 2024-04-17 DIAGNOSIS — J45909 Unspecified asthma, uncomplicated: Secondary | ICD-10-CM | POA: Insufficient documentation

## 2024-04-17 DIAGNOSIS — Z760 Encounter for issue of repeat prescription: Secondary | ICD-10-CM | POA: Insufficient documentation

## 2024-04-17 LAB — CBC
HCT: 42.8 % (ref 36.0–46.0)
Hemoglobin: 13.8 g/dL (ref 12.0–15.0)
MCH: 28.2 pg (ref 26.0–34.0)
MCHC: 32.2 g/dL (ref 30.0–36.0)
MCV: 87.3 fL (ref 80.0–100.0)
Platelets: 306 10*3/uL (ref 150–400)
RBC: 4.9 MIL/uL (ref 3.87–5.11)
RDW: 13.7 % (ref 11.5–15.5)
WBC: 6.5 10*3/uL (ref 4.0–10.5)
nRBC: 0 % (ref 0.0–0.2)

## 2024-04-17 LAB — BASIC METABOLIC PANEL WITH GFR
Anion gap: 9 (ref 5–15)
BUN: 15 mg/dL (ref 8–23)
CO2: 27 mmol/L (ref 22–32)
Calcium: 9.3 mg/dL (ref 8.9–10.3)
Chloride: 103 mmol/L (ref 98–111)
Creatinine, Ser: 0.77 mg/dL (ref 0.44–1.00)
GFR, Estimated: >60 mL/min (ref >60)
Glucose, Bld: 109 mg/dL — ABNORMAL HIGH (ref 70–99)
Potassium: 3.9 mmol/L (ref 3.5–5.1)
Sodium: 139 mmol/L (ref 135–145)

## 2024-04-17 MED ORDER — PREDNISONE 20 MG PO TABS: 60.0000 mg | ORAL_TABLET | Freq: Once | ORAL | Status: DC

## 2024-04-17 MED ORDER — IPRATROPIUM-ALBUTEROL 0.5-2.5 (3) MG/3ML IN SOLN: 3.0000 mL | Freq: Once | RESPIRATORY_TRACT | Status: DC

## 2024-04-17 MED ORDER — ALBUTEROL SULFATE HFA 108 (90 BASE) MCG/ACT IN AERS: 1.0000 | INHALATION_SPRAY | Freq: Four times a day (QID) | RESPIRATORY_TRACT | 0 refills | Status: DC | PRN

## 2024-04-17 MED ORDER — PREDNISONE 20 MG PO TABS: ORAL_TABLET | ORAL | 0 refills | Status: DC

## 2024-04-17 NOTE — ED Triage Notes (Signed)
 Pt c/o SOB x 2 hours and requests a breathing treatment. Hx of asthma. Pt reports she is out of albuterol  at home. RR even and unlabored, chest expansion symmetrical, and lung sounds clear. Pt is 100% on RA, and in NAD. Pt denies chest pain.

## 2024-04-17 NOTE — ED Provider Notes (Signed)
 [place] EMERGENCY DEPARTMENT AT [facility] Provider Note   CSN: [id number] Arrival date & time: 04/17/24  0426     History  Chief Complaint  Patient presents with   Asthma    Kathleen [name] is a 63 y.o. female.  The history is provided by the patient.  Asthma This is a recurrent problem. The current episode started 2 days ago. The problem occurs constantly. The problem has not changed since onset.Pertinent negatives include no chest pain, no abdominal pain, no headaches and no shortness of breath. Nothing aggravates the symptoms. Nothing relieves the symptoms. She has tried nothing for the symptoms. The treatment provided no relief.  Patient is out of her inhaler for 2 days.  Needs a refill.       Home Medications Prior to Admission medications   Medication Sig Start Date End Date Taking? Authorizing Provider  albuterol  (VENTOLIN  HFA) 108 (90 Base) MCG/ACT inhaler Inhale 1-2 puffs into the lungs every 6 (six) hours as needed for wheezing or shortness of breath. 04/17/24  Yes Sonika Levins, MD  predniSONE  (DELTASONE ) 20 MG tablet 3 tabs po day one, then 2 po daily x 4 days 04/17/24  Yes Sueellen Kayes, MD  acetaminophen  (TYLENOL ) 160 MG chewable tablet Chew 500 mg by mouth every 6 (six) hours as needed for pain.    [provider]  albuterol  (PROVENTIL ) (2.5 MG/3ML) 0.083% nebulizer solution Use 1 vial (2.5 mg total) by nebulization every 4 (four) hours as needed for wheezing or shortness of breath. 04/08/23   Nooruddin, Saad, MD  albuterol  (VENTOLIN  HFA) 108 (90 Base) MCG/ACT inhaler Inhale 2 puffs into the lungs every 6 (six) hours as needed for wheezing or shortness of breath. 04/08/23   Nooruddin, Saad, MD  aspirin  EC 81 MG tablet Take 81 mg by mouth daily. Swallow whole.    [provider]  Cyanocobalamin (B-12 PO) Take 1 tablet by mouth daily.    [provider]  DULoxetine  (CYMBALTA ) 30 MG capsule Take 1 capsule (30 mg total) by mouth daily.  05/19/23 05/18/24  Nooruddin, Saad, MD  EPINEPHrine  0.3 mg/0.3 mL IJ SOAJ injection Inject 0.3 mg into the muscle as needed for anaphylaxis. 08/19/23   Jerilynn Montenegro, MD  fluticasone  (FLOVENT  HFA) 110 MCG/ACT inhaler Inhale 2 puffs into the lungs 2 (two) times daily. 03/10/23 03/09/24  Lovell Rubenstein, NP  guaiFENesin -dextromethorphan  (ROBITUSSIN DM) 100-10 MG/5ML syrup Take 10 mLs by mouth every 4 (four) hours as needed for cough. 03/10/23   Gherghe, Costin M, MD  hydrOXYzine  (ATARAX ) 25 MG tablet Take 1 tablet (25 mg total) by mouth 3 (three) times daily as needed for anxiety. 05/19/23   Nooruddin, Saad, MD  ipratropium-albuterol  (DUONEB) 0.5-2.5 (3) MG/3ML SOLN Take 3 mLs by nebulization every 2 (two) hours as needed. 05/19/23   Nooruddin, Saad, MD  lidocaine  (LIDODERM ) 5 % Place 1 patch onto the skin daily. Remove & Discard patch within 12 hours or as directed by MD 03/06/24   Priscella Brooms, DO  loratadine  (CLARITIN ) 10 MG tablet Take 1 tablet (10 mg total) by mouth daily. 02/01/23   Samtani, Jai-Gurmukh, MD  montelukast  (SINGULAIR ) 10 MG tablet Take 1 tablet (10 mg total) by mouth at bedtime. 02/01/23   Samtani, Jai-Gurmukh, MD  Multiple Vitamin (MULTIVITAMIN PO) Take 1 tablet by mouth daily.    [provider]  ondansetron  (ZOFRAN ) 4 MG tablet TAKE 1 TABLET BY MOUTH ONCE DAILY AS NEEDED FOR NAUSEA FOR VOMITING 05/19/23  Nooruddin, Saad, MD  pantoprazole  (PROTONIX ) 40 MG tablet Take 1 tablet (40 mg total) by mouth 2 (two) times daily before a meal. Patient not taking: Reported on 11/25/2023 02/01/23   Samtani, Jai-Gurmukh, MD  TRELEGY ELLIPTA  200-62.5-25 MCG/ACT AEPB Inhale 1 puff into the lungs daily. 05/19/23   Nooruddin, Saad, MD      Allergies    Bee venom, Peach [prunus persica], Topamax [topiramate], and Tramadol    Review of Systems   Review of Systems  Constitutional:  Negative for fever.  HENT:  Negative for ear discharge.   Respiratory:  Positive for wheezing. Negative for shortness  of breath.   Cardiovascular:  Negative for chest pain.  Gastrointestinal:  Negative for abdominal pain.  Neurological:  Negative for headaches.  All other systems reviewed and are negative.   Physical Exam Updated Vital Signs BP (!) 162/88 (BP Location: Right Arm)   Pulse 67   Temp 98 F (36.7 C) (Oral)   Resp (!) 22   Ht 5\' 2"  (1.575 m)   Wt 56.7 kg   LMP 11/08/2014   SpO2 100%   BMI 22.86 kg/m  Physical Exam Vitals and nursing note reviewed.  Constitutional:      General: She is not in acute distress.    Appearance: Normal appearance. She is well-developed.  HENT:     Head: Normocephalic and atraumatic.     Nose: Nose normal.  Eyes:     Pupils: Pupils are equal, round, and reactive to light.  Cardiovascular:     Rate and Rhythm: Normal rate and regular rhythm.     Pulses: Normal pulses.     Heart sounds: Normal heart sounds.  Pulmonary:     Effort: Pulmonary effort is normal. No respiratory distress.     Breath sounds: Normal breath sounds. No wheezing or rales.  Abdominal:     General: Bowel sounds are normal. There is no distension.     Palpations: Abdomen is soft.     Tenderness: There is no abdominal tenderness. There is no guarding or rebound.  Musculoskeletal:        General: Normal range of motion.     Cervical back: Neck supple.  Skin:    General: Skin is warm and dry.     Capillary Refill: Capillary refill takes less than 2 seconds.     Findings: No erythema or rash.  Neurological:     General: No focal deficit present.     Mental Status: She is alert.     Deep Tendon Reflexes: Reflexes normal.  Psychiatric:        Mood and Affect: Mood normal.     ED Results / Procedures / Treatments   Labs (all labs ordered are listed, but only abnormal results are displayed) Results for orders placed or performed during the hospital encounter of 04/17/24  Basic metabolic panel   Collection Time: 04/17/24  4:47 AM  Result Value Ref Range   Sodium 139 135 -  145 mmol/L   Potassium 3.9 3.5 - 5.1 mmol/L   Chloride 103 98 - 111 mmol/L   CO2 27 22 - 32 mmol/L   Glucose, Bld 109 (H) 70 - 99 mg/dL   BUN 15 8 - 23 mg/dL   Creatinine, Ser 2.44 0.44 - 1.00 mg/dL   Calcium  9.3 8.9 - 10.3 mg/dL   GFR, Estimated >01 >02 mL/min   Anion gap 9 5 - 15  CBC   Collection Time: 04/17/24  4:47 AM  Result  Value Ref Range   WBC 6.5 4.0 - 10.5 K/uL   RBC 4.90 3.87 - 5.11 MIL/uL   Hemoglobin 13.8 12.0 - 15.0 g/dL   HCT 56.2 13.0 - 86.5 %   MCV 87.3 80.0 - 100.0 fL   MCH 28.2 26.0 - 34.0 pg   MCHC 32.2 30.0 - 36.0 g/dL   RDW 78.4 69.6 - 29.5 %   Platelets 306 150 - 400 K/uL   nRBC 0.0 0.0 - 0.2 %   DG Chest 2 View Result Date: 04/17/2024 CLINICAL DATA:  Shortness of breath and asthma. EXAM: CHEST - 2 VIEW COMPARISON:  02/24/2024 FINDINGS: Heart size and mediastinal contours appear normal. No pleural fluid, interstitial edema or airspace disease. The visualized osseous structures are unremarkable. IMPRESSION: No active cardiopulmonary disease. Electronically Signed   By: Kimberley Penman M.D.   On: 04/17/2024 05:49    Radiology DG Chest 2 View Result Date: 04/17/2024 CLINICAL DATA:  Shortness of breath and asthma. EXAM: CHEST - 2 VIEW COMPARISON:  02/24/2024 FINDINGS: Heart size and mediastinal contours appear normal. No pleural fluid, interstitial edema or airspace disease. The visualized osseous structures are unremarkable. IMPRESSION: No active cardiopulmonary disease. Electronically Signed   By: Kimberley Penman M.D.   On: 04/17/2024 05:49    Procedures Procedures    Medications Ordered in ED Medications  ipratropium-albuterol  (DUONEB) 0.5-2.5 (3) MG/3ML nebulizer solution 3 mL (has no administration in time range)  predniSONE  (DELTASONE ) tablet 60 mg (has no administration in time range)    ED Course/ Medical Decision Making/ A&P                                 Medical Decision Making Patient is out of her inhaler   Amount and/or Complexity of  Data Reviewed External Data Reviewed: notes.    Details: Previous notes reviewed  Labs: ordered.    Details: Normal sodium 139, normal potassium 3.9, normal creatinine 0.77, normal white count 6.5, normal hemoglobin 13.8, normal platelets  Radiology: ordered and independent interpretation performed.    Details: Normal   Risk Prescription drug management. Risk Details: Well appearing.  Lungs are clear.  Requested neb and refill. Have provided this as well as RX for steroids.  Stable for discharge.     \ Final Clinical Impression(s) / ED Diagnoses Final diagnoses:  None   No signs of systemic illness or infection. The patient is nontoxic-appearing on exam and vital signs are within normal limits.  I have reviewed the triage vital signs and the nursing notes. Pertinent labs & imaging results that were available during my care of the patient were reviewed by me and considered in my medical decision making (see chart for details). After history, exam, and medical workup I feel the patient has been appropriately medically screened and is safe for discharge home. Pertinent diagnoses were discussed with the patient. Patient was given return precautions.  Rx / DC Orders ED Discharge Orders          Ordered    albuterol  (VENTOLIN  HFA) 108 (90 Base) MCG/ACT inhaler  Every 6 hours PRN        04/17/24 0621    predniSONE  (DELTASONE ) 20 MG tablet        04/17/24 0621              Caryl Manas, MD 04/17/24 [phone]

## 2024-04-27 ENCOUNTER — Ambulatory Visit: Payer: Self-pay
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
## 2024-04-27 NOTE — Telephone Encounter (Signed)
 Copied from CRM [id number]. Topic: Clinical - Red Word Triage >> Apr 27, 2024  8:42 AM Julie Oddi wrote: Red Word that prompted transfer to Nurse Triage: Fall/Both Hands swollen unable to move finger  Patient called back to see if PCP had called in an antibiotic.  Instructed patient to wait for PCP to look at request.  Instructed to go UC.

## 2024-04-30 NOTE — Telephone Encounter (Signed)
 I called pt, no answer; mailbox is full, unable to leave a message.

## 2024-05-02 ENCOUNTER — Inpatient Hospital Stay: Payer: Self-pay | Admitting: Student

## 2024-05-02 ENCOUNTER — Emergency Department (HOSPITAL_COMMUNITY)

## 2024-05-02 ENCOUNTER — Other Ambulatory Visit: Payer: Self-pay

## 2024-05-02 ENCOUNTER — Telehealth (HOSPITAL_COMMUNITY): Payer: Self-pay | Admitting: Emergency Medicine

## 2024-05-02 ENCOUNTER — Emergency Department (HOSPITAL_COMMUNITY)
Admission: EM | Admit: 2024-05-02 | Discharge: 2024-05-02 | Disposition: A | Discharge status: Home / Self Care | Attending: Emergency Medicine | Admitting: Emergency Medicine

## 2024-05-02 ENCOUNTER — Ambulatory Visit: Payer: Self-pay

## 2024-05-02 DIAGNOSIS — Z7982 Long term (current) use of aspirin: Secondary | ICD-10-CM | POA: Diagnosis not present

## 2024-05-02 DIAGNOSIS — Y92019 Unspecified place in single-family (private) house as the place of occurrence of the external cause: Secondary | ICD-10-CM | POA: Insufficient documentation

## 2024-05-02 DIAGNOSIS — S60922A Unspecified superficial injury of left hand, initial encounter: Secondary | ICD-10-CM | POA: Diagnosis present

## 2024-05-02 DIAGNOSIS — W19XXXA Unspecified fall, initial encounter: Secondary | ICD-10-CM | POA: Diagnosis not present

## 2024-05-02 DIAGNOSIS — S62325A Displaced fracture of shaft of fourth metacarpal bone, left hand, initial encounter for closed fracture: Secondary | ICD-10-CM | POA: Insufficient documentation

## 2024-05-02 MED ORDER — LIDOCAINE HCL (PF) 1 % IJ SOLN: 10.0000 mL | Freq: Once | INTRAMUSCULAR | Status: DC

## 2024-05-02 MED ORDER — MORPHINE SULFATE 15 MG PO TABS: 7.5000 mg | ORAL_TABLET | ORAL | 0 refills | Status: DC | PRN

## 2024-05-02 MED ORDER — OXYCODONE-ACETAMINOPHEN 5-325 MG PO TABS: 2.0000 | ORAL_TABLET | Freq: Once | ORAL | Status: DC

## 2024-05-02 MED ORDER — OXYCODONE-ACETAMINOPHEN 5-325 MG PO TABS
2.0000 | ORAL_TABLET | Freq: Once | ORAL | Status: AC
  Administered 2024-05-02: 2 via ORAL
  Filled 2024-05-02: qty 2

## 2024-05-02 NOTE — Progress Notes (Signed)
 Orthopedic Tech Progress Note Patient Details:  Kathleen [name] Aug 30, 1961 [id number]  Ortho Devices Type of Ortho Device: Ulna gutter splint Ortho Device/Splint Location: LUE Ortho Device/Splint Interventions: Application   Post Interventions Patient Tolerated: Well Instructions Provided: Care of device  Kathleen [name] 05/02/2024, 12:49 PM

## 2024-05-02 NOTE — ED Provider Notes (Addendum)
 [place] EMERGENCY DEPARTMENT AT [facility] Provider Note   CSN: [id number] Arrival date & time: 05/02/24  0505     History  Chief Complaint  Patient presents with   Left Hand Swelling    Kathleen [name] is a 63 y.o. female.  HPI 63 year old female with mechanical fall.  Patient has pain and swelling to the left hand.  She did not strike her head or have other injuries.     Home Medications Prior to Admission medications   Medication Sig Start Date End Date Taking? Authorizing Provider  acetaminophen  (TYLENOL ) 160 MG chewable tablet Chew 500 mg by mouth every 6 (six) hours as needed for pain.    [provider]  albuterol  (PROVENTIL ) (2.5 MG/3ML) 0.083% nebulizer solution Use 1 vial (2.5 mg total) by nebulization every 4 (four) hours as needed for wheezing or shortness of breath. 04/08/23   Nooruddin, Saad, MD  albuterol  (VENTOLIN  HFA) 108 (90 Base) MCG/ACT inhaler Inhale 2 puffs into the lungs every 6 (six) hours as needed for wheezing or shortness of breath. 04/08/23   Nooruddin, Dann Dust, MD  albuterol  (VENTOLIN  HFA) 108 (90 Base) MCG/ACT inhaler Inhale 1-2 puffs into the lungs every 6 (six) hours as needed for wheezing or shortness of breath. 04/17/24   Palumbo, April, MD  aspirin  EC 81 MG tablet Take 81 mg by mouth daily. Swallow whole.    [provider]  Cyanocobalamin (B-12 PO) Take 1 tablet by mouth daily.    [provider]  DULoxetine  (CYMBALTA ) 30 MG capsule Take 1 capsule (30 mg total) by mouth daily. 05/19/23 05/18/24  Nooruddin, Saad, MD  EPINEPHrine  0.3 mg/0.3 mL IJ SOAJ injection Inject 0.3 mg into the muscle as needed for anaphylaxis. 08/19/23   Jerilynn Montenegro, MD  fluticasone  (FLOVENT  HFA) 110 MCG/ACT inhaler Inhale 2 puffs into the lungs 2 (two) times daily. 03/10/23 03/09/24  Lovell Rubenstein, NP  guaiFENesin -dextromethorphan  (ROBITUSSIN DM) 100-10 MG/5ML syrup Take 10 mLs by mouth every 4 (four) hours as needed for cough. 03/10/23    Gherghe, Costin M, MD  hydrOXYzine  (ATARAX ) 25 MG tablet Take 1 tablet (25 mg total) by mouth 3 (three) times daily as needed for anxiety. 05/19/23   Nooruddin, Saad, MD  ipratropium-albuterol  (DUONEB) 0.5-2.5 (3) MG/3ML SOLN Take 3 mLs by nebulization every 2 (two) hours as needed. 05/19/23   Nooruddin, Saad, MD  lidocaine  (LIDODERM ) 5 % Place 1 patch onto the skin daily. Remove & Discard patch within 12 hours or as directed by MD 03/06/24   Priscella Brooms, DO  loratadine  (CLARITIN ) 10 MG tablet Take 1 tablet (10 mg total) by mouth daily. 02/01/23   Samtani, Jai-Gurmukh, MD  montelukast  (SINGULAIR ) 10 MG tablet Take 1 tablet (10 mg total) by mouth at bedtime. 02/01/23   Samtani, Jai-Gurmukh, MD  Multiple Vitamin (MULTIVITAMIN PO) Take 1 tablet by mouth daily.    [provider]  ondansetron  (ZOFRAN ) 4 MG tablet TAKE 1 TABLET BY MOUTH ONCE DAILY AS NEEDED FOR NAUSEA FOR VOMITING 05/19/23   Nooruddin, Saad, MD  pantoprazole  (PROTONIX ) 40 MG tablet Take 1 tablet (40 mg total) by mouth 2 (two) times daily before a meal. Patient not taking: Reported on 11/25/2023 02/01/23   Samtani, Jai-Gurmukh, MD  predniSONE  (DELTASONE ) 20 MG tablet 3 tabs po day one, then 2 po daily x 4 days 04/17/24   Palumbo, April, MD  TRELEGY ELLIPTA  200-62.5-25 MCG/ACT AEPB Inhale 1 puff into the lungs daily. 05/19/23   Nooruddin, Saad,  MD      Allergies    Bee venom, Peach [prunus persica], Topamax [topiramate], and Tramadol    Review of Systems   Review of Systems  Physical Exam Updated Vital Signs BP 124/83 (BP Location: Right Arm)   Pulse 81   Temp 98.7 F (37.1 C)   Resp 19   LMP 11/08/2014   SpO2 100%  Physical Exam Vitals and nursing note reviewed.  Constitutional:      General: She is not in acute distress.    Appearance: She is well-developed.  HENT:     Head: Normocephalic and atraumatic.     Right Ear: External ear normal.     Left Ear: External ear normal.     Nose: Nose normal.  Eyes:      Conjunctiva/sclera: Conjunctivae normal.     Pupils: Pupils are equal, round, and reactive to light.  Pulmonary:     Effort: Pulmonary effort is normal.  Musculoskeletal:        General: Swelling and tenderness present. Normal range of motion.     Cervical back: Normal range of motion and neck supple.     Comments: Left hand with pain and swelling over the fourth metacarpal. Fingers are pink with capillary refill less than 2 seconds, sensation intact, motor intact.  Skin:    General: Skin is warm and dry.  Neurological:     Mental Status: She is alert and oriented to person, place, and time.     Motor: No abnormal muscle tone.     Coordination: Coordination normal.  Psychiatric:        Behavior: Behavior normal.        Thought Content: Thought content normal.     ED Results / Procedures / Treatments   Labs (all labs ordered are listed, but only abnormal results are displayed) Labs Reviewed - No data to display  EKG None  Radiology DG Hand Complete Left Result Date: 05/02/2024 CLINICAL DATA:  Fall.  Injury/swelling. EXAM: LEFT HAND - COMPLETE 3+ VIEW COMPARISON:  None Available. FINDINGS: There is an acute, obliquely oriented fracture deformity involving the mid shaft of the fourth metacarpal bone. Tiny os ossific density is noted adjacent to the tip of the radial styloid, nonspecific. There is mild volar angulation of the distal fracture fragments. No significant arthropathy. IMPRESSION: Acute, obliquely oriented fracture deformity involves the mid shaft of the fourth metacarpal bone. Tiny os ossific density adjacent to the tip of the radial styloid which may reflect a small ossicle or sequelae of age-indeterminate injury. Electronically Signed   By: Kimberley Penman M.D.   On: 05/02/2024 05:47    Procedures .Foreign Body Removal  Date/Time: 05/02/2024 2:55 PM  Performed by: Auston Blush, MD Authorized by: Auston Blush, MD  Consent: Verbal consent obtained. Risks and benefits:  risks, benefits and alternatives were discussed Patient identity confirmed: verbally with patient Body area: skin General location: upper extremity Location details: left ring finger  Sedation: Patient sedated: no  Patient restrained: no Tendon involvement: none Complexity: simple Post-procedure assessment: foreign body removed Comments: Ring removal from bilateral ring fingers with swelling post injury      Medications Ordered in ED Medications - No data to display  ED Course/ Medical Decision Making/ A&P                                 Medical Decision Making Amount and/or Complexity of Data Reviewed Radiology:  ordered.  Risk Prescription drug management.   X-[place] reviewed with fourth metacarpal fracture noted with some displacement.  Care discussed with Steffanie Edouard, PA-C.  Advises ulnar gutter splint and follow-up with Dr. Primus Brookes        Final Clinical Impression(s) / ED Diagnoses Final diagnoses:  Closed displaced fracture of shaft of fourth metacarpal bone of left hand, initial encounter    Rx / DC Orders ED Discharge Orders     None         Auston Blush, MD 05/02/24 1223    Auston Blush, MD 05/02/24 1456

## 2024-05-02 NOTE — ED Notes (Signed)
 Pt left prior to admin of pain medication. Provider come to consult with pt and pt had already left the facility. Pt called on primary number multiple times. Phone rang one time before going to voicemail.

## 2024-05-02 NOTE — ED Notes (Addendum)
 Pt provided discharge paperwork. Pt waiting to speak with Provider regarding pain medication rx.

## 2024-05-02 NOTE — Telephone Encounter (Signed)
 3rd attempt, "call cannot be completed as dialed" Routing to clinic for follow up.

## 2024-05-02 NOTE — Telephone Encounter (Signed)
 Patient was seen in the emergency department with a metacarpal fracture.  Was reportedly told by the provider that saw her that there were plans to send her home with pain medication.  No obvious pain medication prescription was sent.  Patient with fx.  Likely painful condition.   Will send script.

## 2024-05-02 NOTE — Telephone Encounter (Signed)
 This RN attempted 2nd call. Rang once and then 'call cannot be completed as dialed'
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
## 2024-05-02 NOTE — Discharge Instructions (Addendum)
 Please call Dr. Delta Fila office today for follow-up as soon as possible.  Keep splint in place.  Elevate hand above heart level and use cold therapy as well as acetaminophen  for pain.
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
## 2024-05-02 NOTE — ED Triage Notes (Signed)
 Patient fell at home last week , reports swelling at left hand /fingers .

## 2024-05-02 NOTE — ED Notes (Signed)
 Pt transported to imaging.

## 2024-05-11 ENCOUNTER — Encounter (HOSPITAL_BASED_OUTPATIENT_CLINIC_OR_DEPARTMENT_OTHER): Payer: Self-pay | Admitting: Orthopedic Surgery

## 2024-05-11 ENCOUNTER — Other Ambulatory Visit: Payer: Self-pay

## 2024-05-12 NOTE — H&P (Signed)
 Kathleen [name] is an 63 y.o. female.   Chief Complaint: Left ring finger metcarpal fracture HPI: Pt sustained injury to left hand Pt seen evaluated in office Pt here for surgery to left hand No prior surgery to left hand  Past Medical History:  Diagnosis Date   Acute respiratory failure with hypoxia and hypercapnia (HCC) 03/08/2023   Anxiety    Arthritis    hands   Asthma    2 sets of PFT's in 04/09 without sign variability. Last set with significant decrease in FEV1 with saline alone, suggesting Asthma but  recommended clinical corelation    Asthma    Asthma exacerbation 04/07/2023   Bipolar 1 disorder [facility])    therapist is Kathleen [name] and is followed by [employer] [facility]   Blackout    negative work up including ESR, ANA, opthalmology referral, carotid dopplers, 2D echo, MRI and EEG.   BREAST LUMP 03/25/2008   Annotation: bilaterally Qualifier: Diagnosis of  By: Arlan Labella MD, Rosalyn Combes.    Breast mass in female    s/p mammogram, u/s and biopsy in 05/09 c/w fibroadenoma,   Bronchitis    Chronic headache    Chronic low back pain 08/08/2008   Qualifier: Diagnosis of  By: Elvan Hamel  MD, Valerie     Chronic pain    normal work up including TSH, RPR, B12, HIV, plain films, 2 ESR's, ANA, CK, RF along with routine CBC, CMET and UA. Further work up includes CRP, ESR, SPEP/UPEP, hepatitis erology, A1C , repeat ANA   COPD (chronic obstructive pulmonary disease) (HCC)    COPD exacerbation (HCC) 03/31/2019   Depression    DUB (dysfunctional uterine bleeding)    and pelvic pain, with negative endometrial bx in 07/09 and transvaginal U/S significant for mild fibroids in 0/09.   Emphysema of lung (HCC)    GERD (gastroesophageal reflux disease)    Hallucinations 03/18/2008   Qualifier: Diagnosis of  By: Elvan Hamel  MD, Tarri Farm     Hyperlipidemia    Lower extremity edema    Neg ABI's, normal 2D echo, normal albumin   Menorrhagia    Ovarian cyst    Polysubstance abuse (HCC)    none since March  17,2009.   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   Tubular adenoma of colon     Past Surgical History:  Procedure Laterality Date   CESAREAN SECTION     CESAREAN SECTION     COLONOSCOPY     HERNIA REPAIR     Left partial oophorectomy     OOPHORECTOMY     1/2 ovary removed   POLYPECTOMY     VAGINA SURGERY     mesh    Family History  Problem Relation Age of Onset   Colon cancer Mother 42   Breast cancer Mother 15   Rectal cancer Mother    Diabetes Father    Hypertension Father    Kidney disease Father    Colon cancer Father 60   Prostate cancer Father    Cancer Father    Kidney disease Sister    Cancer Brother    Breast cancer Maternal Grandmother 103   Esophageal cancer Neg Hx    Stomach cancer Neg Hx    Social History:  reports that she has been smoking cigarettes. She started smoking about 37 years ago. She has a 7.5 pack-year smoking history. She has never used  smokeless tobacco. She reports current alcohol use. She reports current drug use. Drug: Marijuana.  Allergies:  Allergies  Allergen Reactions   Bee Venom Anaphylaxis   Peach [Prunus Persica] Anaphylaxis, Swelling and Other (See Comments)    Throat swells , breaks out   Topamax [Topiramate] Other (See Comments)    Hallucinations    Tramadol Nausea Only    No medications prior to admission.    No results found for this or any previous visit (from the past 48 hours). No results found.  ROS : no recent illnesses or hospitalizations  Height 5\' 2"  (1.575 m), weight 59.9 kg, last menstrual period 11/08/2014. Physical Exam  General Appearance:  Alert, cooperative, no distress, appears stated age  Head:  Normocephalic, without obvious abnormality, atraumatic  Eyes:  Pupils equal, conjunctiva/corneas clear,         Throat: Lips, mucosa, and tongue normal; teeth and gums normal  Neck: No visible masses     Lungs:   respirations  unlabored  Chest Wall:  No tenderness or deformity  Heart:  Regular rate and rhythm,  Abdomen:   Soft, non-tender,         Extremities: Left hand: skin intact, fingers warm well perfused Limited digital motion  Pulses: 2+ and symmetric  Skin: Skin color, texture, turgor normal, no rashes or lesions     Neurologic: Normal    Assessment/Plan Left ring finger metacarpal displaced fracture  Left ring finger open reduction and internal fixation and repair as indicated  R/B/A DISCUSSED WITH PT IN OFFICE.  PT VOICED UNDERSTANDING OF PLAN CONSENT SIGNED DAY OF SURGERY PT SEEN AND EXAMINED PRIOR TO OPERATIVE PROCEDURE/DAY OF SURGERY SITE MARKED. QUESTIONS ANSWERED WILL GO HOME FOLLOWING SURGERY   WE ARE PLANNING SURGERY FOR YOUR UPPER EXTREMITY. THE RISKS AND BENEFITS OF SURGERY INCLUDE BUT NOT LIMITED TO BLEEDING INFECTION, DAMAGE TO NEARBY NERVES ARTERIES TENDONS, FAILURE OF SURGERY TO ACCOMPLISH ITS INTENDED GOALS, PERSISTENT SYMPTOMS AND NEED FOR FURTHER SURGICAL INTERVENTION. WITH THIS IN MIND WE WILL PROCEED. I HAVE DISCUSSED WITH THE PATIENT THE PRE AND POSTOPERATIVE REGIMEN AND THE DOS AND DON'TS. PT VOICED UNDERSTANDING AND INFORMED CONSENT SIGNED.   Donata Fryer [facility] 05/14/2024 @ 1011

## 2024-05-14 ENCOUNTER — Encounter (HOSPITAL_BASED_OUTPATIENT_CLINIC_OR_DEPARTMENT_OTHER)
Admission: RE | Disposition: A | Discharge status: Home / Self Care | Payer: Self-pay | Source: Home / Self Care | Attending: Orthopedic Surgery

## 2024-05-14 ENCOUNTER — Ambulatory Visit (HOSPITAL_BASED_OUTPATIENT_CLINIC_OR_DEPARTMENT_OTHER)
Admission: RE | Admit: 2024-05-14 | Discharge: 2024-05-14 | Disposition: A | Discharge status: Home / Self Care | Attending: Orthopedic Surgery | Admitting: Orthopedic Surgery

## 2024-05-14 ENCOUNTER — Ambulatory Visit (HOSPITAL_BASED_OUTPATIENT_CLINIC_OR_DEPARTMENT_OTHER): Admitting: Anesthesiology

## 2024-05-14 ENCOUNTER — Encounter (HOSPITAL_BASED_OUTPATIENT_CLINIC_OR_DEPARTMENT_OTHER): Payer: Self-pay | Admitting: Orthopedic Surgery

## 2024-05-14 ENCOUNTER — Other Ambulatory Visit: Payer: Self-pay

## 2024-05-14 DIAGNOSIS — S62327A Displaced fracture of shaft of fifth metacarpal bone, left hand, initial encounter for closed fracture: Secondary | ICD-10-CM | POA: Insufficient documentation

## 2024-05-14 DIAGNOSIS — X58XXXA Exposure to other specified factors, initial encounter: Secondary | ICD-10-CM | POA: Diagnosis not present

## 2024-05-14 DIAGNOSIS — G4730 Sleep apnea, unspecified: Secondary | ICD-10-CM | POA: Diagnosis not present

## 2024-05-14 DIAGNOSIS — F17210 Nicotine dependence, cigarettes, uncomplicated: Secondary | ICD-10-CM | POA: Insufficient documentation

## 2024-05-14 DIAGNOSIS — Z01818 Encounter for other preprocedural examination: Secondary | ICD-10-CM

## 2024-05-14 DIAGNOSIS — K219 Gastro-esophageal reflux disease without esophagitis: Secondary | ICD-10-CM | POA: Diagnosis not present

## 2024-05-14 DIAGNOSIS — S62325A Displaced fracture of shaft of fourth metacarpal bone, left hand, initial encounter for closed fracture: Secondary | ICD-10-CM | POA: Diagnosis present

## 2024-05-14 DIAGNOSIS — M1990 Unspecified osteoarthritis, unspecified site: Secondary | ICD-10-CM | POA: Diagnosis not present

## 2024-05-14 DIAGNOSIS — Z8673 Personal history of transient ischemic attack (TIA), and cerebral infarction without residual deficits: Secondary | ICD-10-CM | POA: Diagnosis not present

## 2024-05-14 DIAGNOSIS — J449 Chronic obstructive pulmonary disease, unspecified: Secondary | ICD-10-CM | POA: Diagnosis not present

## 2024-05-14 DIAGNOSIS — F319 Bipolar disorder, unspecified: Secondary | ICD-10-CM | POA: Insufficient documentation

## 2024-05-14 HISTORY — PX: OPEN REDUCTION INTERNAL FIXATION (ORIF) METACARPAL: SHX6234

## 2024-05-14 SURGERY — OPEN REDUCTION INTERNAL FIXATION (ORIF) METACARPAL
Anesthesia: Monitor Anesthesia Care | Site: Hand | Laterality: Left

## 2024-05-14 MED ORDER — DROPERIDOL 2.5 MG/ML IJ SOLN
INTRAMUSCULAR | Status: AC
  Filled 2024-05-14: qty 2

## 2024-05-14 MED ORDER — LACTATED RINGERS IV SOLN: INTRAVENOUS | Status: DC

## 2024-05-14 MED ORDER — PROPOFOL 10 MG/ML IV BOLUS
INTRAVENOUS | Status: DC | PRN
  Administered 2024-05-14: 30 mg via INTRAVENOUS

## 2024-05-14 MED ORDER — MIDAZOLAM HCL 2 MG/2ML IJ SOLN
2.0000 mg | Freq: Once | INTRAMUSCULAR | Status: AC
  Administered 2024-05-14: 2 mg via INTRAVENOUS

## 2024-05-14 MED ORDER — CLONIDINE HCL (ANALGESIA) 100 MCG/ML EP SOLN
EPIDURAL | Status: DC | PRN
  Administered 2024-05-14: 100 ug

## 2024-05-14 MED ORDER — FENTANYL CITRATE (PF) 100 MCG/2ML IJ SOLN
100.0000 ug | Freq: Once | INTRAMUSCULAR | Status: AC
  Administered 2024-05-14: 100 ug via INTRAVENOUS

## 2024-05-14 MED ORDER — OXYCODONE HCL 5 MG/5ML PO SOLN: 5.0000 mg | Freq: Once | ORAL | Status: DC | PRN

## 2024-05-14 MED ORDER — OXYCODONE HCL 5 MG PO TABS: 5.0000 mg | ORAL_TABLET | Freq: Once | ORAL | Status: DC | PRN

## 2024-05-14 MED ORDER — FENTANYL CITRATE (PF) 100 MCG/2ML IJ SOLN
INTRAMUSCULAR | Status: AC
  Filled 2024-05-14: qty 2

## 2024-05-14 MED ORDER — POVIDONE-IODINE 7.5 % EX SOLN
Freq: Once | CUTANEOUS | Status: DC
  Filled 2024-05-14: qty 118

## 2024-05-14 MED ORDER — FENTANYL CITRATE (PF) 100 MCG/2ML IJ SOLN: 25.0000 ug | INTRAMUSCULAR | Status: DC | PRN

## 2024-05-14 MED ORDER — CEFAZOLIN SODIUM-DEXTROSE 2-4 GM/100ML-% IV SOLN
2.0000 g | INTRAVENOUS | Status: AC
  Administered 2024-05-14: 2 g via INTRAVENOUS

## 2024-05-14 MED ORDER — PROPOFOL 500 MG/50ML IV EMUL
INTRAVENOUS | Status: DC | PRN
  Administered 2024-05-14: 75 ug/kg/min via INTRAVENOUS

## 2024-05-14 MED ORDER — BUPIVACAINE-EPINEPHRINE (PF) 0.5% -1:200000 IJ SOLN
INTRAMUSCULAR | Status: DC | PRN
  Administered 2024-05-14: 30 mL via PERINEURAL

## 2024-05-14 MED ORDER — DROPERIDOL 2.5 MG/ML IJ SOLN
0.6250 mg | Freq: Once | INTRAMUSCULAR | Status: AC | PRN
  Administered 2024-05-14: 0.625 mg via INTRAVENOUS

## 2024-05-14 MED ORDER — MIDAZOLAM HCL 2 MG/2ML IJ SOLN
INTRAMUSCULAR | Status: AC
  Filled 2024-05-14: qty 2

## 2024-05-14 MED ORDER — EPHEDRINE SULFATE-NACL 50-0.9 MG/10ML-% IV SOSY
PREFILLED_SYRINGE | INTRAVENOUS | Status: DC | PRN
  Administered 2024-05-14 (×2): 10 mg via INTRAVENOUS

## 2024-05-14 MED ORDER — CEFAZOLIN SODIUM-DEXTROSE 2-4 GM/100ML-% IV SOLN
INTRAVENOUS | Status: AC
  Filled 2024-05-14: qty 100

## 2024-05-14 SURGICAL SUPPLY — 52 items
BENZOIN TINCTURE PRP APPL 2/3 (GAUZE/BANDAGES/DRESSINGS) IMPLANT
BIT DRILL CALIBR MINI 1.2 (BIT) IMPLANT
BIT DRILL CALIBR MINI 1.6 (BIT) IMPLANT
BLADE SURG 15 STRL LF DISP TIS (BLADE) ×2 IMPLANT
BNDG ELASTIC 2INX 5YD STR LF (GAUZE/BANDAGES/DRESSINGS) IMPLANT
BNDG ELASTIC 3INX 5YD STR LF (GAUZE/BANDAGES/DRESSINGS) IMPLANT
BNDG ESMARK 4X9 LF (GAUZE/BANDAGES/DRESSINGS) ×1 IMPLANT
BNDG GAUZE DERMACEA FLUFF 4 (GAUZE/BANDAGES/DRESSINGS) IMPLANT
BRUSH SCRUB EZ PLAIN DRY (MISCELLANEOUS) ×1 IMPLANT
CANISTER SUCT 1200ML W/VALVE (MISCELLANEOUS) IMPLANT
CORD BIPOLAR FORCEPS 12FT (ELECTRODE) ×1 IMPLANT
COUNTERSINK DRILL ARTH 1.6 (DRILL) IMPLANT
COVER BACK TABLE 60X90IN (DRAPES) ×1 IMPLANT
CUFF TOURN SGL QUICK 18X4 (TOURNIQUET CUFF) IMPLANT
DRAPE EXTREMITY T 121X128X90 (DISPOSABLE) ×1 IMPLANT
DRAPE OEC MINIVIEW 54X84 (DRAPES) ×1 IMPLANT
DRAPE SURG 17X23 STRL (DRAPES) ×1 IMPLANT
DRSG EMULSION OIL 3X3 NADH (GAUZE/BANDAGES/DRESSINGS) ×1 IMPLANT
GAUZE 4X4 16PLY ~~LOC~~+RFID DBL (SPONGE) IMPLANT
GAUZE SPONGE 4X4 12PLY STRL (GAUZE/BANDAGES/DRESSINGS) IMPLANT
GAUZE XEROFORM 1X8 LF (GAUZE/BANDAGES/DRESSINGS) IMPLANT
GLOVE BIO SURGEON STRL SZ 6.5 (GLOVE) ×1 IMPLANT
GLOVE BIOGEL PI IND STRL 6.5 (GLOVE) ×1 IMPLANT
GLOVE BIOGEL PI IND STRL 8.5 (GLOVE) ×1 IMPLANT
GLOVE SURG ORTHO 8.0 STRL STRW (GLOVE) ×1 IMPLANT
GOWN STRL REUS W/ TWL LRG LVL3 (GOWN DISPOSABLE) ×1 IMPLANT
NDL HYPO 25X1 1.5 SAFETY (NEEDLE) IMPLANT
NEEDLE HYPO 25X1 1.5 SAFETY (NEEDLE) IMPLANT
NS IRRIG 1000ML POUR BTL (IV SOLUTION) ×1 IMPLANT
PACK BASIN DAY SURGERY FS (CUSTOM PROCEDURE TRAY) ×1 IMPLANT
PAD CAST 3X4 CTTN HI CHSV (CAST SUPPLIES) ×1 IMPLANT
PADDING CAST ABS COTTON 3X4 (CAST SUPPLIES) ×1 IMPLANT
PADDING UNDERCAST 2X4 STRL (CAST SUPPLIES) IMPLANT
SCREW CORT LP 1.6X8 (Screw) IMPLANT
SCREW CORT LP 1.6X9 (Screw) IMPLANT
SHEET MEDIUM DRAPE 40X70 STRL (DRAPES) IMPLANT
SPIKE FLUID TRANSFER (MISCELLANEOUS) IMPLANT
SPLINT FIBERGLASS 3X35 (CAST SUPPLIES) IMPLANT
STOCKINETTE 4X48 STRL (DRAPES) ×1 IMPLANT
STRIP CLOSURE SKIN 1/2X4 (GAUZE/BANDAGES/DRESSINGS) IMPLANT
SUCTION TUBE FRAZIER 10FR DISP (SUCTIONS) IMPLANT
SUT MNCRL AB 3-0 PS2 18 (SUTURE) ×1 IMPLANT
SUT MNCRL AB 4-0 PS2 18 (SUTURE) IMPLANT
SUT MON AB 4-0 PC3 18 (SUTURE) IMPLANT
SUT PROLENE 4 0 PS 2 18 (SUTURE) ×1 IMPLANT
SUT VIC AB 3-0 FS2 27 (SUTURE) ×1 IMPLANT
SYR BULB EAR ULCER 3OZ GRN STR (SYRINGE) ×1 IMPLANT
SYR CONTROL 10ML LL (SYRINGE) IMPLANT
TOWEL GREEN STERILE FF (TOWEL DISPOSABLE) ×2 IMPLANT
TRAY DSU PREP LF (CUSTOM PROCEDURE TRAY) ×1 IMPLANT
TUBE CONNECTING 20X1/4 (TUBING) IMPLANT
UNDERPAD 30X36 HEAVY ABSORB (UNDERPADS AND DIAPERS) ×1 IMPLANT

## 2024-05-14 NOTE — Transfer of Care (Signed)
 Immediate Anesthesia Transfer of Care Note  Patient: Kathleen [name]  Procedure(s) Performed: OPEN REDUCTION INTERNAL FIXATION (ORIF) METACARPAL (Left: Hand)  Patient Location: PACU  Anesthesia Type:MAC and Regional  Level of Consciousness: awake, alert , and patient cooperative  Airway & Oxygen  Therapy: Patient Spontanous Breathing and Patient connected to face mask oxygen   Post-op Assessment: Report given to RN and Post -op Vital signs reviewed and stable  Post vital signs: Reviewed and stable  Last Vitals:  Vitals Value Taken Time  BP    Temp    Pulse 66 05/14/24 1144  Resp    SpO2 98 % 05/14/24 1144  Vitals shown include unfiled device data.  Last Pain:  Vitals:   05/14/24 0859  TempSrc: Temporal  PainSc: 9       Patients Stated Pain Goal: 6 (05/14/24 0859)  Complications: No notable events documented.

## 2024-05-14 NOTE — Anesthesia Procedure Notes (Signed)
 Anesthesia Regional Block: Supraclavicular block   Pre-Anesthetic Checklist: , timeout performed,  Correct Patient, Correct Site, Correct Laterality,  Correct Procedure, Correct Position, site marked,  Risks and benefits discussed,  Pre-op evaluation,  At surgeon's request and post-op pain management  Laterality: Left  Prep: Maximum Sterile Barrier Precautions used, chloraprep       Needles:  Injection technique: Single-shot  Needle Type: Echogenic Stimulator Needle     Needle Length: 9cm  Needle Gauge: 22     Additional Needles:   Procedures:,,,, ultrasound used (permanent image in chart),,    Narrative:  Start time: 05/14/2024 9:26 AM End time: 05/14/2024 9:29 AM Injection made incrementally with aspirations every 5 mL.  Performed by: Personally  Anesthesiologist: Vernadine Golas, MD  Additional Notes: Risks, benefits, and alternative discussed. Patient gave consent for procedure. Patient prepped and draped in sterile fashion. Sedation administered, patient remains easily responsive to voice. Relevant anatomy identified with ultrasound guidance. Local anesthetic given in 5cc increments with no signs or symptoms of intravascular injection. No pain or paraesthesias with injection. Patient monitored throughout procedure with signs of LAST or immediate complications. Tolerated well. Ultrasound image placed in chart.  Amador Junes, MD

## 2024-05-14 NOTE — Discharge Instructions (Addendum)
 KEEP BANDAGE CLEAN AND DRY CALL OFFICE FOR F/U APPT [phone] in 11 days KEEP HAND ELEVATED ABOVE HEART OK TO APPLY ICE TO OPERATIVE AREA CONTACT OFFICE IF ANY WORSENING PAIN OR CONCERNS.    Post Anesthesia Home Care Instructions  Activity: Get plenty of rest for the remainder of the day. A responsible individual must stay with you for 24 hours following the procedure.  For the next 24 hours, DO NOT: -Drive a car -Advertising copywriter -Drink alcoholic beverages -Take any medication unless instructed by your physician -Make any legal decisions or sign important papers.  Meals: Start with liquid foods such as gelatin or soup. Progress to regular foods as tolerated. Avoid greasy, spicy, heavy foods. If nausea and/or vomiting occur, drink only clear liquids until the nausea and/or vomiting subsides. Call your physician if vomiting continues.  Special Instructions/Symptoms: Your throat may feel dry or sore from the anesthesia or the breathing tube placed in your throat during surgery. If this causes discomfort, gargle with warm salt water . The discomfort should disappear within 24 hours.  If you had a scopolamine patch placed behind your ear for the management of post- operative nausea and/or vomiting:  1. The medication in the patch is effective for 72 hours, after which it should be removed.  Wrap patch in a tissue and discard in the trash. Wash hands thoroughly with soap and water . 2. You may remove the patch earlier than 72 hours if you experience unpleasant side effects which may include dry mouth, dizziness or visual disturbances. 3. Avoid touching the patch. Wash your hands with soap and water  after contact with the patch.   Regional Anesthesia Blocks  1. You may not be able to move or feel the "blocked" extremity after a regional anesthetic block. This may last may last from 3-48 hours after placement, but it will go away. The length of time depends on the medication injected and your  individual response to the medication. As the nerves start to wake up, you may experience tingling as the movement and feeling returns to your extremity. If the numbness and inability to move your extremity has not gone away after 48 hours, please call your surgeon.   2. The extremity that is blocked will need to be protected until the numbness is gone and the strength has returned. Because you cannot feel it, you will need to take extra care to avoid injury. Because it may be weak, you may have difficulty moving it or using it. You may not know what position it is in without looking at it while the block is in effect.  3. For blocks in the legs and feet, returning to weight bearing and walking needs to be done carefully. You will need to wait until the numbness is entirely gone and the strength has returned. You should be able to move your leg and foot normally before you try and bear weight or walk. You will need someone to be with you when you first try to ensure you do not fall and possibly risk injury.  4. Bruising and tenderness at the needle site are common side effects and will resolve in a few days.  5. Persistent numbness or new problems with movement should be communicated to the surgeon or the [facility] [phone] [facility] [phone]).

## 2024-05-14 NOTE — Anesthesia Postprocedure Evaluation (Signed)
 Anesthesia Post Note  Patient: Kathleen [name]  Procedure(s) Performed: OPEN REDUCTION INTERNAL FIXATION (ORIF) METACARPAL (Left: Hand)     Patient location during evaluation: PACU Anesthesia Type: Regional Level of consciousness: awake and alert Pain management: pain level controlled Vital Signs Assessment: post-procedure vital signs reviewed and stable Respiratory status: spontaneous breathing, nonlabored ventilation and respiratory function stable Cardiovascular status: blood pressure returned to baseline Postop Assessment: no apparent nausea or vomiting Anesthetic complications: no   No notable events documented.  Last Vitals:  Vitals:   05/14/24 1200 05/14/24 1230  BP: 129/86 125/85  Pulse: 64 75  Resp: 16 16  Temp:  (!) 36.2 C  SpO2: 97% 98%    Last Pain:  Vitals:   05/14/24 1230  TempSrc:   PainSc: 0-No pain                 Rayfield Cairo

## 2024-05-14 NOTE — Op Note (Signed)
 PREOPERATIVE DIAGNOSIS: Left ring finger metacarpal shaft fracture  POSTOPERATIVE DIAGNOSIS: Same  ATTENDING SURGEON: Dr. Arvil Birks who scrubbed and present for the entire procedure  ASSISTANT SURGEON: None  ANESTHESIA: Regional with IV sedation  OPERATIVE PROCEDURE: Open treatment of left ring finger metacarpal shaft fracture requiring internal fixation Radiographs 3 views left hand  IMPLANTS: Arthrex three 1.6 millimeter screws  EBL: Minimal  RADIOGRAPHIC INTERPRETATION: AP lateral oblique views of the hand do show the internal fixation with good alignment of the ring finger metacarpal shaft  SURGICAL INDICATIONS: Patient is a right-hand-dominant female who sustained a closed injury to her left ring finger.  Patient seen evaluate the office and recommend undergo the procedure.  The risk of surgery include but not limited to bleeding infection damage nearby nerves arteries or tendons loss of motion of the wrist and digits incomplete relief of symptoms and need for further surgical invention.  Signed informed consent was obtained on the day of surgery.  SURGICAL TECHNIQUE: Patient was properly identified in the preoperative holding area marked for marker made on the right hand indicate correct operative site.  The patient then brought back the operating placed supine on the anesthesia table where the regional anesthetic had been administered.  IV sedation was administered.  Preoperative antibiotics were given prior to skin incision  Well-padded tourniquet placed on the left brachium unsealed the appropriate drape.  The left upper extremity then prepped and draped in normal sterile fashion.  A timeout was called the correct site was identified the procedure then began.  Attention then turned to the left hand.  A longitude incision made directly over the ring finger metacarpal.  Dissection carried down through the skin and subcutaneous tissue.  Deep dissection carried out of the extensor  mechanism which was carefully retracted other way and the fascia layer incised longitudinally.  Bone exposure was then carried out.  Open reduction was then performed.  Takedown of the fracture callus was then carried out and the reduction was held with reduction clamps.  There was good reduction held with the reduction clamps.  Following this given the long obliquity nature of the fracture lag screw fixation felt to be amendable.  Following this three 1.6 mm lag screws were then placed in varying planes over drilling the near cortex and under drilling/cortex.  Countersink and depth gauge measurement was carried out.  Following this the screws were then seated nicely with good purchase.  Final radiographs were then obtained.  The wound was then irrigated.  The fascia layer was then closed with Vicryl.  The subcutaneous tissues closed Monocryl and the skin closed with simple Prolene suture.  Adaptic dressing sterile compressive bandage applied.  The patient was placed in a well-padded volar splint hand safe resting hand position.  Patient was then taken recovery in good condition.  POSTOPERATIVE PLAN: Patient discharged to home.  See her back in office in 11 days for wound check suture removal plan to see her therapist for ulnar gutter splint begin a postoperative ORIF of the metacarpal plate and screw construct.  Radiographs at each visit

## 2024-05-14 NOTE — Anesthesia Preprocedure Evaluation (Addendum)
 Anesthesia Evaluation  Patient identified by MRN, date of birth, ID band Patient awake    Reviewed: Allergy & Precautions, NPO status , Patient's Chart, lab work & pertinent test results  History of Anesthesia Complications Negative for: history of anesthetic complications  Airway Mallampati: II  TM Distance: >3 FB Neck ROM: Full    Dental  (+) Edentulous Lower, Edentulous Upper   Pulmonary sleep apnea , COPD,  COPD inhaler, Current Smoker and Patient abstained from smoking.   Pulmonary exam normal        Cardiovascular negative cardio ROS Normal cardiovascular exam     Neuro/Psych  Headaches  Anxiety Depression Bipolar Disorder   CVA    GI/Hepatic Neg liver ROS,GERD  Medicated,,  Endo/Other  negative endocrine ROS    Renal/GU negative Renal ROS     Musculoskeletal  (+) Arthritis ,    Abdominal   Peds  Hematology negative hematology ROS (+)   Anesthesia Other Findings Chronic pain on morphine  7.5mg  Q4h PRN  Reproductive/Obstetrics                             Anesthesia Physical Anesthesia Plan  ASA: 2  Anesthesia Plan: MAC and Regional   Post-op Pain Management: Tylenol  PO (pre-op)*   Induction:   PONV Risk Score and Plan: 1 and Treatment may vary due to age or medical condition, Propofol infusion, Ondansetron  and Midazolam  Airway Management Planned: Natural Airway and Simple Face Mask  Additional Equipment: None  Intra-op Plan:   Post-operative Plan:   Informed Consent: I have reviewed the patients History and Physical, chart, labs and discussed the procedure including the risks, benefits and alternatives for the proposed anesthesia with the patient or authorized representative who has indicated his/her understanding and acceptance.       Plan Discussed with: CRNA  Anesthesia Plan Comments:        Anesthesia Quick Evaluation

## 2024-05-14 NOTE — Anesthesia Procedure Notes (Signed)
 Procedure Name: MAC Date/Time: 05/14/2024 10:40 AM  Performed by: Glorya Larsson, CRNAPre-anesthesia Checklist: Patient identified, Emergency Drugs available, Suction available, Patient being monitored and Timeout performed Patient Re-evaluated:Patient Re-evaluated prior to induction Oxygen  Delivery Method: Simple face mask Placement Confirmation: positive ETCO2 Dental Injury: Teeth and Oropharynx as per pre-operative assessment

## 2024-05-14 NOTE — Progress Notes (Signed)
Assisted Dr. Daiva Huge with left, supraclavicular, ultrasound guided block. Side rails up, monitors on throughout procedure. See vital signs in flow sheet. Tolerated Procedure well. ?

## 2024-05-16 ENCOUNTER — Encounter (HOSPITAL_BASED_OUTPATIENT_CLINIC_OR_DEPARTMENT_OTHER): Payer: Self-pay | Admitting: Orthopedic Surgery

## 2024-05-22 ENCOUNTER — Encounter: Payer: Self-pay | Admitting: *Deleted

## 2024-05-26 ENCOUNTER — Encounter (HOSPITAL_COMMUNITY): Payer: Self-pay | Admitting: Emergency Medicine

## 2024-05-26 ENCOUNTER — Emergency Department (HOSPITAL_COMMUNITY)

## 2024-05-26 ENCOUNTER — Emergency Department (HOSPITAL_COMMUNITY)
Admission: EM | Admit: 2024-05-26 | Discharge: 2024-05-26 | Disposition: A | Discharge status: Home / Self Care | Attending: Emergency Medicine | Admitting: Emergency Medicine

## 2024-05-26 DIAGNOSIS — Y92009 Unspecified place in unspecified non-institutional (private) residence as the place of occurrence of the external cause: Secondary | ICD-10-CM | POA: Diagnosis not present

## 2024-05-26 DIAGNOSIS — Z7982 Long term (current) use of aspirin: Secondary | ICD-10-CM | POA: Diagnosis not present

## 2024-05-26 DIAGNOSIS — W1839XA Other fall on same level, initial encounter: Secondary | ICD-10-CM | POA: Diagnosis not present

## 2024-05-26 DIAGNOSIS — J441 Chronic obstructive pulmonary disease with (acute) exacerbation: Secondary | ICD-10-CM | POA: Diagnosis not present

## 2024-05-26 DIAGNOSIS — R0602 Shortness of breath: Secondary | ICD-10-CM | POA: Diagnosis present

## 2024-05-26 LAB — CBC WITH DIFFERENTIAL/PLATELET
Abs Immature Granulocytes: 0.01 10*3/uL (ref 0.00–0.07)
Basophils Absolute: 0.1 10*3/uL (ref 0.0–0.1)
Basophils Relative: 1 %
Eosinophils Absolute: 0.5 10*3/uL (ref 0.0–0.5)
Eosinophils Relative: 6 %
HCT: 41.3 % (ref 36.0–46.0)
Hemoglobin: 13.5 g/dL (ref 12.0–15.0)
Immature Granulocytes: 0 %
Lymphocytes Relative: 33 %
Lymphs Abs: 2.8 10*3/uL (ref 0.7–4.0)
MCH: 28 pg (ref 26.0–34.0)
MCHC: 32.7 g/dL (ref 30.0–36.0)
MCV: 85.7 fL (ref 80.0–100.0)
Monocytes Absolute: 0.8 10*3/uL (ref 0.1–1.0)
Monocytes Relative: 10 %
Neutro Abs: 4.1 10*3/uL (ref 1.7–7.7)
Neutrophils Relative %: 50 %
Platelets: 323 10*3/uL (ref 150–400)
RBC: 4.82 MIL/uL (ref 3.87–5.11)
RDW: 13.2 % (ref 11.5–15.5)
WBC: 8.3 10*3/uL (ref 4.0–10.5)
nRBC: 0 % (ref 0.0–0.2)

## 2024-05-26 LAB — BASIC METABOLIC PANEL WITH GFR
Anion gap: 12 (ref 5–15)
BUN: 16 mg/dL (ref 8–23)
CO2: 22 mmol/L (ref 22–32)
Calcium: 9 mg/dL (ref 8.9–10.3)
Chloride: 105 mmol/L (ref 98–111)
Creatinine, Ser: 0.91 mg/dL (ref 0.44–1.00)
GFR, Estimated: >60 mL/min (ref >60)
Glucose, Bld: 69 mg/dL — ABNORMAL LOW (ref 70–99)
Potassium: 3.4 mmol/L — ABNORMAL LOW (ref 3.5–5.1)
Sodium: 139 mmol/L (ref 135–145)

## 2024-05-26 MED ORDER — PREDNISONE 20 MG PO TABS: ORAL_TABLET | ORAL | 0 refills | Status: DC

## 2024-05-26 MED ORDER — OXYCODONE-ACETAMINOPHEN 5-325 MG PO TABS
1.0000 | ORAL_TABLET | Freq: Once | ORAL | Status: AC
  Filled 2024-05-26: qty 1

## 2024-05-26 MED ORDER — METHYLPREDNISOLONE SODIUM SUCC 125 MG IJ SOLR
125.0000 mg | Freq: Once | INTRAMUSCULAR | Status: AC
  Filled 2024-05-26: qty 2

## 2024-05-26 MED ORDER — IPRATROPIUM-ALBUTEROL 0.5-2.5 (3) MG/3ML IN SOLN
3.0000 mL | Freq: Once | RESPIRATORY_TRACT | Status: AC
  Administered 2024-05-26: 3 mL via RESPIRATORY_TRACT
  Filled 2024-05-26: qty 3

## 2024-05-26 MED ORDER — ALBUTEROL SULFATE HFA 108 (90 BASE) MCG/ACT IN AERS
2.0000 | INHALATION_SPRAY | Freq: Once | RESPIRATORY_TRACT | Status: AC
  Filled 2024-05-26: qty 6.7

## 2024-05-26 MED ORDER — IPRATROPIUM-ALBUTEROL 0.5-2.5 (3) MG/3ML IN SOLN
3.0000 mL | Freq: Once | RESPIRATORY_TRACT | Status: AC
  Filled 2024-05-26: qty 3

## 2024-05-26 NOTE — ED Notes (Signed)
Patient verbalizes understanding of discharge instructions. Opportunity for questioning and answers were provided. Armband removed by staff, pt discharged from ED. Ambulated out to lobby  

## 2024-05-26 NOTE — Discharge Instructions (Addendum)
Take the prescribed medication as directed.  Continue using inhaler as needed. Follow-up with your primary care doctor. Return to the ED for new or worsening symptoms.

## 2024-05-26 NOTE — ED Provider Notes (Signed)
 [place] EMERGENCY DEPARTMENT AT [facility] Provider Note   CSN: [id number] Arrival date & time: 05/26/24  7829     Patient presents with: Shortness of Breath   Kathleen [name] is a 63 y.o. female.   The history is provided by the patient and medical records.  Shortness of Breath  63 y.o. female with history of polysubstance abuse, bipolar disorder, asthma, COPD, anxiety, chronic pain, presenting to the ED with shortness of breath.  Patient reports she had a fall at home, fell on left hand which she had surgery on about 1 week ago.  Just had sutures removed today and sent to PT.  States she feels SOB now, out of her home inhalers.  Has underlying asthma as well.  Denies cough/fever/chills.  No chest pain.    Prior to Admission medications   Medication Sig Start Date End Date Taking? Authorizing Provider  albuterol  (PROVENTIL ) (2.5 MG/3ML) 0.083% nebulizer solution Use 1 vial (2.5 mg total) by nebulization every 4 (four) hours as needed for wheezing or shortness of breath. 04/08/23   Nooruddin, Saad, MD  albuterol  (VENTOLIN  HFA) 108 (90 Base) MCG/ACT inhaler Inhale 2 puffs into the lungs every 6 (six) hours as needed for wheezing or shortness of breath. 04/08/23   Nooruddin, Saad, MD  albuterol  (VENTOLIN  HFA) 108 (90 Base) MCG/ACT inhaler Inhale 1-2 puffs into the lungs every 6 (six) hours as needed for wheezing or shortness of breath. 04/17/24   Palumbo, April, MD  aspirin  EC 81 MG tablet Take 81 mg by mouth daily. Swallow whole.    [provider]  Cyanocobalamin (B-12 PO) Take 1 tablet by mouth daily.    [provider]  EPINEPHrine  0.3 mg/0.3 mL IJ SOAJ injection Inject 0.3 mg into the muscle as needed for anaphylaxis. 08/19/23   Jerilynn Montenegro, MD  guaiFENesin -dextromethorphan  (ROBITUSSIN DM) 100-10 MG/5ML syrup Take 10 mLs by mouth every 4 (four) hours as needed for cough. 03/10/23   Gherghe, Costin M, MD  hydrOXYzine  (ATARAX ) 25 MG tablet Take 1 tablet (25 mg  total) by mouth 3 (three) times daily as needed for anxiety. 05/19/23   Nooruddin, Saad, MD  ipratropium-albuterol  (DUONEB) 0.5-2.5 (3) MG/3ML SOLN Take 3 mLs by nebulization every 2 (two) hours as needed. 05/19/23   Nooruddin, Saad, MD  lidocaine  (LIDODERM ) 5 % Place 1 patch onto the skin daily. Remove & Discard patch within 12 hours or as directed by MD 03/06/24   Priscella Brooms, DO  loratadine  (CLARITIN ) 10 MG tablet Take 1 tablet (10 mg total) by mouth daily. 02/01/23   Samtani, Jai-Gurmukh, MD  ondansetron  (ZOFRAN ) 4 MG tablet TAKE 1 TABLET BY MOUTH ONCE DAILY AS NEEDED FOR NAUSEA FOR VOMITING 05/19/23   Nooruddin, Saad, MD  oxyCODONE -acetaminophen  (PERCOCET/ROXICET) 5-325 MG tablet Take by mouth every 4 (four) hours as needed for severe pain (pain score 7-10).    [provider]  pantoprazole  (PROTONIX ) 40 MG tablet Take 1 tablet (40 mg total) by mouth 2 (two) times daily before a meal. 02/01/23   Samtani, Jai-Gurmukh, MD  TRELEGY ELLIPTA  200-62.5-25 MCG/ACT AEPB Inhale 1 puff into the lungs daily. 05/19/23   Nooruddin, Saad, MD    Allergies: Bee venom, Peach [prunus persica], Topamax [topiramate], and Tramadol    Review of Systems  Respiratory:  Positive for shortness of breath.   All other systems reviewed and are negative.   Updated Vital Signs BP (!) 150/114 (BP Location: Left Arm)   Pulse (!) 118  Temp 98.5 F (36.9 C) (Oral)   LMP 11/08/2014   SpO2 98%   Physical Exam Vitals and nursing note reviewed.  Constitutional:      Appearance: She is well-developed.  HENT:     Head: Normocephalic and atraumatic.   Eyes:     Conjunctiva/sclera: Conjunctivae normal.     Pupils: Pupils are equal, round, and reactive to light.    Cardiovascular:     Rate and Rhythm: Normal rate and regular rhythm.     Heart sounds: Normal heart sounds.  Pulmonary:     Effort: Pulmonary effort is normal. Tachypnea present.     Breath sounds: Wheezing present.     Comments: Tachypneic with  diffuse wheezes, able to speak in truncated sentences Abdominal:     General: Bowel sounds are normal.     Palpations: Abdomen is soft.   Musculoskeletal:        General: Normal range of motion.     Cervical back: Normal range of motion.     Comments: Right hand in orthopedic brace, no acute deformity noted, radial pulse intact   Skin:    General: Skin is warm and dry.   Neurological:     Mental Status: She is alert and oriented to person, place, and time.     (all labs ordered are listed, but only abnormal results are displayed) Labs Reviewed  BASIC METABOLIC PANEL WITH GFR - Abnormal; Notable for the following components:      Result Value   Potassium 3.4 (*)    Glucose, Bld 69 (*)    All other components within normal limits  CBC WITH DIFFERENTIAL/PLATELET    EKG: None  Radiology: DG Chest 1 View Result Date: 05/26/2024 EXAM: 1 VIEW XRAY OF THE CHEST 05/26/2024 02:01:54 AM COMPARISON: 04/17/2024 CLINICAL HISTORY: Fall today. Recent left hand surgery. Pain and cough. FINDINGS: LUNGS AND PLEURA: No focal pulmonary opacity. No pulmonary edema. No pleural effusion. No pneumothorax. HEART AND MEDIASTINUM: No acute abnormality of the cardiac and mediastinal silhouettes. BONES AND SOFT TISSUES: No acute osseous abnormality. IMPRESSION: 1. No acute process. Electronically signed by: Zadie Herter MD 05/26/2024 02:09 AM EDT RP Workstation: [id number]   DG Hand Complete Left Result Date: 05/26/2024 EXAM: 3 VIEW(S) XRAY OF THE LEFT HAND 05/26/2024 02:01:54 AM COMPARISON: 05/02/2024 CLINICAL HISTORY: Fall today. Recent left hand surgery. Pain and cough. FINDINGS: BONES AND JOINTS: 3 screws transfixing an oblique fourth metacarpal shaft fracture with improved alignment. Fracture lucency remains visible. No acute fracture is seen. SOFT TISSUES: Overlying splint obscures fine osseous detail. Mild dorsal soft tissue swelling overlying the hand. IMPRESSION: 1. Status post ORIF of an oblique  fourth metacarpal shaft fracture. Persistent fracture lucency. 2. No acute fracture. Electronically signed by: Zadie Herter MD 05/26/2024 02:08 AM EDT RP Workstation: [id number]     Procedures   Medications Ordered in the ED  methylPREDNISolone  sodium succinate (SOLU-MEDROL ) 125 mg/2 mL injection 125 mg (125 mg Intravenous Given 05/26/24 0146)  ipratropium-albuterol  (DUONEB) 0.5-2.5 (3) MG/3ML nebulizer solution 3 mL (3 mLs Nebulization Given 05/26/24 0139)  oxyCODONE -acetaminophen  (PERCOCET/ROXICET) 5-325 MG per tablet 1 tablet (1 tablet Oral Given 05/26/24 0138)  ipratropium-albuterol  (DUONEB) 0.5-2.5 (3) MG/3ML nebulizer solution 3 mL (3 mLs Nebulization Given 05/26/24 0419)  albuterol  (VENTOLIN  HFA) 108 (90 Base) MCG/ACT inhaler 2 puff (2 puffs Inhalation Given 05/26/24 0419)  Medical Decision Making Amount and/or Complexity of Data Reviewed Labs: ordered. Radiology: ordered and independent interpretation performed. ECG/medicine tests: ordered and independent interpretation performed.  Risk Prescription drug management.   63 year old female presenting to the ED with shortness of breath.  History of asthma and COPD, currently out of her home inhalers.  She is afebrile and nontoxic in appearance here.  She does have some expiratory wheezes on exam.  Sats remain in the 90s, speaking in short, truncated sentences currently.  Also reports a fall today where she struck her hand on the floor.  There was no head injury or loss of consciousness.  Just had sutures removed today as well.  Will obtain labs, chest x-ray, and x-ray of left hand.  She is given Solu-Medrol  and DuoNeb.  Will reassess.  Feeling better after 1st neb.  She has been able to eat sandwich and crackers here.  Sats 95% on repeat evaluation.  Labs are reassuring without leukocytosis or electrolyte derangement.  X-ray without any acute infiltrate or other acute lung findings.  Hand films with  continued evidence of fracture but no superimposed injuries.  We have discussed all of her lab and imaging results today.  She is requesting second DuoNeb and inhaler to go home with.  This has been ordered.  5:37 AM After 2nd neb lung sounds have cleared.  VSS on RA.  Not having any  chest pain or ongoing shortness of breath.  Her tachycardia has resolved.  Suspect this is likely COPD/asthma exacerbation.  Did recently have hand surgery but as symptoms improved with steroids and nebs, doubt acute PE.  Appears stable for discharge.  Recommend close follow-up with PCP.  Return here for new concerns.  Final diagnoses:  COPD exacerbation [facility])    ED Discharge Orders     None          Coretha Dew, PA-C 05/26/24 0543    Alissa April, MD 05/27/24 [phone]

## 2024-05-26 NOTE — ED Triage Notes (Addendum)
 PT reports needing breathing treatment. Oxygen  is 98%. Pt states started earlier today and is out of neb treatments. Hx of COPD. Pt had recent hand surgery. Pt is hunched over and audibly wheezing.

## 2024-05-26 NOTE — ED Notes (Signed)
 Pt eating [place] sandwich and graham crackers

## 2024-05-29 ENCOUNTER — Inpatient Hospital Stay: Payer: Self-pay | Admitting: Student

## 2024-05-30 ENCOUNTER — Other Ambulatory Visit: Payer: Self-pay | Admitting: Internal Medicine

## 2024-05-30 ENCOUNTER — Encounter: Payer: Self-pay | Admitting: Student

## 2024-05-30 DIAGNOSIS — Z1231 Encounter for screening mammogram for malignant neoplasm of breast: Secondary | ICD-10-CM

## 2024-06-07 ENCOUNTER — Ambulatory Visit

## 2024-06-07 ENCOUNTER — Other Ambulatory Visit: Payer: Self-pay | Admitting: Student

## 2024-06-07 DIAGNOSIS — J4489 Other specified chronic obstructive pulmonary disease: Secondary | ICD-10-CM

## 2024-06-08 ENCOUNTER — Other Ambulatory Visit: Payer: Self-pay | Admitting: Student

## 2024-06-08 ENCOUNTER — Ambulatory Visit: Payer: Self-pay

## 2024-06-08 DIAGNOSIS — M533 Sacrococcygeal disorders, not elsewhere classified: Secondary | ICD-10-CM

## 2024-06-08 NOTE — Telephone Encounter (Unsigned)
 Copied from CRM [id number]. Topic: Clinical - Medication Refill >> Jun 08, 2024  9:42 AM Merlynn A wrote: Medication: lidocaine  (LIDODERM ) 5 %,    Has the patient contacted their pharmacy? Yes (Agent: If no, request that the patient contact the pharmacy for the refill. If patient does not wish to contact the pharmacy document the reason why and proceed with request.) (Agent: If yes, when and what did the pharmacy advise?)  Patient was advised to contact providers office to request refill.   This is the patient's preferred pharmacy:  [facility] [address] [place], [place] - [address] [place] [address] Phone: [phone] Fax: [phone]  Is this the correct pharmacy for this prescription? Yes If no, delete pharmacy and type the correct one.   Has the prescription been filled recently? No  Is the patient out of the medication? Yes  Has the patient been seen for an appointment in the last year OR does the patient have an upcoming appointment? Yes  Can we respond through MyChart? No  Agent: Please be advised that Rx refills may take up to 3 business days. We ask that you follow-up with your pharmacy.

## 2024-06-08 NOTE — Telephone Encounter (Signed)
 Attempted to contact patient- call answered and disconnected. Attempted second call- not answered and message states caller not available at this time- try call later- unable to leave call back message. This is the third attempt to reach patient- will forward to office.

## 2024-06-08 NOTE — Telephone Encounter (Addendum)
 06/08/24 12:30-2nd attempt patient answered but then disconnected when this writer began speaking.   06/08/24 11:30 1st attempt. [id number]  Copied from CRM #[id number]. Topic: Clinical - Medical Advice >> Jun 08, 2024  9:44 AM Merlynn A wrote: Reason for CRM: Patient is requesting medication for possible UTI, patient will be going to in patient rehab. Patient is unsure if she needs to come in for appointment. Please contact patient if she needs to come in for appointment.

## 2024-06-11 NOTE — Telephone Encounter (Signed)
 Attempted to call the patient x2. Unable to reach the patient, unable to lvm.

## 2024-06-11 NOTE — Telephone Encounter (Signed)
 Medication sent to pharmacy

## 2024-06-13 MED ORDER — LIDOCAINE 5 % EX PTCH: 1.0000 | MEDICATED_PATCH | CUTANEOUS | 5 refills | Status: DC

## 2024-06-14 ENCOUNTER — Other Ambulatory Visit: Payer: Self-pay

## 2024-06-14 ENCOUNTER — Observation Stay (HOSPITAL_COMMUNITY)
Admission: EM | Admit: 2024-06-14 | Discharge: 2024-06-15 | Disposition: A | Discharge status: Home / Self Care | Attending: Internal Medicine | Admitting: Internal Medicine

## 2024-06-14 ENCOUNTER — Encounter (HOSPITAL_COMMUNITY): Payer: Self-pay | Admitting: Internal Medicine

## 2024-06-14 ENCOUNTER — Emergency Department (HOSPITAL_COMMUNITY)

## 2024-06-14 DIAGNOSIS — K219 Gastro-esophageal reflux disease without esophagitis: Secondary | ICD-10-CM | POA: Insufficient documentation

## 2024-06-14 DIAGNOSIS — J441 Chronic obstructive pulmonary disease with (acute) exacerbation: Principal | ICD-10-CM | POA: Diagnosis present

## 2024-06-14 DIAGNOSIS — J4489 Other specified chronic obstructive pulmonary disease: Secondary | ICD-10-CM

## 2024-06-14 DIAGNOSIS — J9601 Acute respiratory failure with hypoxia: Secondary | ICD-10-CM

## 2024-06-14 DIAGNOSIS — F1410 Cocaine abuse, uncomplicated: Secondary | ICD-10-CM | POA: Diagnosis present

## 2024-06-14 DIAGNOSIS — Z79899 Other long term (current) drug therapy: Secondary | ICD-10-CM | POA: Diagnosis not present

## 2024-06-14 DIAGNOSIS — F1210 Cannabis abuse, uncomplicated: Secondary | ICD-10-CM | POA: Diagnosis present

## 2024-06-14 DIAGNOSIS — Z8673 Personal history of transient ischemic attack (TIA), and cerebral infarction without residual deficits: Secondary | ICD-10-CM | POA: Diagnosis not present

## 2024-06-14 DIAGNOSIS — F17210 Nicotine dependence, cigarettes, uncomplicated: Secondary | ICD-10-CM | POA: Diagnosis not present

## 2024-06-14 DIAGNOSIS — R0602 Shortness of breath: Secondary | ICD-10-CM | POA: Diagnosis present

## 2024-06-14 DIAGNOSIS — Z1152 Encounter for screening for COVID-19: Secondary | ICD-10-CM | POA: Diagnosis not present

## 2024-06-14 DIAGNOSIS — J9620 Acute and chronic respiratory failure, unspecified whether with hypoxia or hypercapnia: Secondary | ICD-10-CM | POA: Diagnosis not present

## 2024-06-14 DIAGNOSIS — Z720 Tobacco use: Secondary | ICD-10-CM | POA: Diagnosis present

## 2024-06-14 DIAGNOSIS — Z7982 Long term (current) use of aspirin: Secondary | ICD-10-CM | POA: Diagnosis not present

## 2024-06-14 DIAGNOSIS — F1290 Cannabis use, unspecified, uncomplicated: Secondary | ICD-10-CM | POA: Insufficient documentation

## 2024-06-14 LAB — BASIC METABOLIC PANEL WITH GFR
Anion gap: 9 (ref 5–15)
BUN: 22 mg/dL (ref 8–23)
CO2: 23 mmol/L (ref 22–32)
Calcium: 9.1 mg/dL (ref 8.9–10.3)
Chloride: 104 mmol/L (ref 98–111)
Creatinine, Ser: 1.13 mg/dL — ABNORMAL HIGH (ref 0.44–1.00)
GFR, Estimated: 55 mL/min — ABNORMAL LOW (ref >60)
Glucose, Bld: 92 mg/dL (ref 70–99)
Potassium: 4.2 mmol/L (ref 3.5–5.1)
Sodium: 136 mmol/L (ref 135–145)

## 2024-06-14 LAB — CBC
HCT: 44.9 % (ref 36.0–46.0)
Hemoglobin: 14.5 g/dL (ref 12.0–15.0)
MCH: 27.6 pg (ref 26.0–34.0)
MCHC: 32.3 g/dL (ref 30.0–36.0)
MCV: 85.4 fL (ref 80.0–100.0)
Platelets: 317 10*3/uL (ref 150–400)
RBC: 5.26 MIL/uL — ABNORMAL HIGH (ref 3.87–5.11)
RDW: 13.6 % (ref 11.5–15.5)
WBC: 9.2 10*3/uL (ref 4.0–10.5)
nRBC: 0 % (ref 0.0–0.2)

## 2024-06-14 LAB — RESP PANEL BY RT-PCR (RSV, FLU A&B, COVID)  RVPGX2
Influenza A by PCR: NEGATIVE
Influenza B by PCR: NEGATIVE
Resp Syncytial Virus by PCR: NEGATIVE
SARS Coronavirus 2 by RT PCR: NEGATIVE

## 2024-06-14 LAB — HIV ANTIBODY (ROUTINE TESTING W REFLEX): HIV Screen 4th Generation wRfx: NONREACTIVE

## 2024-06-14 LAB — TROPONIN I (HIGH SENSITIVITY)
Troponin I (High Sensitivity): 4 ng/L (ref <18)
Troponin I (High Sensitivity): 3 ng/L (ref <18)

## 2024-06-14 MED ORDER — MAGNESIUM SULFATE 2 GM/50ML IV SOLN
2.0000 g | Freq: Once | INTRAVENOUS | Status: AC
  Administered 2024-06-14: 2 g via INTRAVENOUS
  Filled 2024-06-14: qty 50

## 2024-06-14 MED ORDER — ACETAMINOPHEN 325 MG PO TABS
650.0000 mg | ORAL_TABLET | Freq: Four times a day (QID) | ORAL | Status: DC | PRN
  Filled 2024-06-14: qty 2

## 2024-06-14 MED ORDER — METHYLPREDNISOLONE SODIUM SUCC 40 MG IJ SOLR
40.0000 mg | Freq: Two times a day (BID) | INTRAMUSCULAR | Status: DC
  Administered 2024-06-14 – 2024-06-15 (×3): 40 mg via INTRAVENOUS
  Filled 2024-06-14 (×3): qty 1

## 2024-06-14 MED ORDER — GUAIFENESIN-DM 100-10 MG/5ML PO SYRP
5.0000 mL | ORAL_SOLUTION | ORAL | Status: DC | PRN
  Administered 2024-06-15 (×2): 5 mL via ORAL
  Filled 2024-06-14 (×2): qty 5

## 2024-06-14 MED ORDER — ONDANSETRON HCL 4 MG/2ML IJ SOLN: 4.0000 mg | Freq: Four times a day (QID) | INTRAMUSCULAR | Status: DC | PRN

## 2024-06-14 MED ORDER — ONDANSETRON HCL 4 MG PO TABS
4.0000 mg | ORAL_TABLET | Freq: Four times a day (QID) | ORAL | Status: DC | PRN
  Administered 2024-06-15: 4 mg via ORAL
  Filled 2024-06-14: qty 1

## 2024-06-14 MED ORDER — ALBUTEROL SULFATE (2.5 MG/3ML) 0.083% IN NEBU
INHALATION_SOLUTION | RESPIRATORY_TRACT | Status: AC
  Administered 2024-06-14: 10 mg/h via RESPIRATORY_TRACT
  Filled 2024-06-14: qty 12

## 2024-06-14 MED ORDER — ACETAMINOPHEN 650 MG RE SUPP: 650.0000 mg | Freq: Four times a day (QID) | RECTAL | Status: DC | PRN

## 2024-06-14 MED ORDER — IPRATROPIUM-ALBUTEROL 0.5-2.5 (3) MG/3ML IN SOLN
3.0000 mL | Freq: Four times a day (QID) | RESPIRATORY_TRACT | Status: DC
  Administered 2024-06-14 (×3): 3 mL via RESPIRATORY_TRACT
  Filled 2024-06-14 (×3): qty 3

## 2024-06-14 MED ORDER — ALBUTEROL SULFATE (2.5 MG/3ML) 0.083% IN NEBU
10.0000 mg/h | INHALATION_SOLUTION | Freq: Once | RESPIRATORY_TRACT | Status: AC
  Filled 2024-06-14: qty 3

## 2024-06-14 MED ORDER — LORATADINE 10 MG PO TABS
10.0000 mg | ORAL_TABLET | Freq: Every day | ORAL | Status: DC
  Administered 2024-06-14 – 2024-06-15 (×2): 10 mg via ORAL
  Filled 2024-06-14 (×2): qty 1

## 2024-06-14 MED ORDER — OXYCODONE-ACETAMINOPHEN 5-325 MG PO TABS
1.0000 | ORAL_TABLET | ORAL | Status: DC | PRN
  Administered 2024-06-14 (×3): 1 via ORAL
  Administered 2024-06-14 – 2024-06-15 (×2): 2 via ORAL
  Filled 2024-06-14: qty 2
  Filled 2024-06-14: qty 1
  Filled 2024-06-14: qty 2
  Filled 2024-06-14 (×2): qty 1

## 2024-06-14 MED ORDER — TRAZODONE HCL 50 MG PO TABS
25.0000 mg | ORAL_TABLET | Freq: Every evening | ORAL | Status: DC | PRN
Start: 2024-06-14 — End: 2024-06-15
  Administered 2024-06-14: 25 mg via ORAL
  Filled 2024-06-14: qty 1

## 2024-06-14 MED ORDER — KETOROLAC TROMETHAMINE 30 MG/ML IJ SOLN
30.0000 mg | Freq: Once | INTRAMUSCULAR | Status: AC
  Administered 2024-06-14: 30 mg via INTRAVENOUS
  Filled 2024-06-14: qty 1

## 2024-06-14 MED ORDER — IPRATROPIUM BROMIDE 0.02 % IN SOLN
0.5000 mg | Freq: Once | RESPIRATORY_TRACT | Status: AC
  Administered 2024-06-14: 0.5 mg via RESPIRATORY_TRACT
  Filled 2024-06-14: qty 2.5

## 2024-06-14 MED ORDER — PANTOPRAZOLE SODIUM 40 MG PO TBEC
40.0000 mg | DELAYED_RELEASE_TABLET | Freq: Two times a day (BID) | ORAL | Status: DC
  Administered 2024-06-14 – 2024-06-15 (×3): 40 mg via ORAL
  Filled 2024-06-14 (×3): qty 1

## 2024-06-14 MED ORDER — DEXAMETHASONE SODIUM PHOSPHATE 10 MG/ML IJ SOLN
10.0000 mg | Freq: Once | INTRAMUSCULAR | Status: AC
  Administered 2024-06-14: 10 mg via INTRAVENOUS
  Filled 2024-06-14: qty 1

## 2024-06-14 MED ORDER — LIDOCAINE 5 % EX PTCH
2.0000 | MEDICATED_PATCH | CUTANEOUS | Status: DC
  Administered 2024-06-14: 2 via TRANSDERMAL
  Filled 2024-06-14: qty 2

## 2024-06-14 MED ORDER — ENOXAPARIN SODIUM 40 MG/0.4ML IJ SOSY
40.0000 mg | PREFILLED_SYRINGE | INTRAMUSCULAR | Status: DC
  Administered 2024-06-14: 40 mg via SUBCUTANEOUS
  Filled 2024-06-14: qty 0.4

## 2024-06-14 MED ORDER — ALBUTEROL SULFATE (2.5 MG/3ML) 0.083% IN NEBU
2.5000 mg | INHALATION_SOLUTION | RESPIRATORY_TRACT | Status: DC | PRN
  Administered 2024-06-15: 2.5 mg via RESPIRATORY_TRACT
  Filled 2024-06-14 (×2): qty 3

## 2024-06-14 NOTE — ED Triage Notes (Signed)
 Pt presents in respiratory distress with c/o [facility] that started around 0030 and increasingly gotten worse. Reports using inhaler x6-7 times with no relief. Endorses lightheadedness, dizziness,CP, and nausea.

## 2024-06-14 NOTE — ED Notes (Signed)
 Called lab to add troponin to previous samples sent

## 2024-06-14 NOTE — Evaluation (Signed)
 Clinical/Bedside Swallow Evaluation Patient Details  Name: Kathleen [name] MRN: [id number] Date of Birth: Mar 07, 1961  Today's Date: 06/14/2024 Time: SLP Start Time (ACUTE ONLY): 1150 SLP Stop Time (ACUTE ONLY): 1235 SLP Time Calculation (min) (ACUTE ONLY): 45 min  Past Medical History:  Past Medical History:  Diagnosis Date   Acute respiratory failure with hypoxia and hypercapnia (HCC) 03/08/2023   Anxiety    Arthritis    hands   Asthma    2 sets of PFT's in 04/09 without sign variability. Last set with significant decrease in FEV1 with saline alone, suggesting Asthma but  recommended clinical corelation    Asthma    Asthma exacerbation 04/07/2023   Bipolar 1 disorder [facility])    therapist is Joy and is followed by [employer] [facility]   Blackout    negative work up including ESR, ANA, opthalmology referral, carotid dopplers, 2D echo, MRI and EEG.   BREAST LUMP 03/25/2008   Annotation: bilaterally Qualifier: Diagnosis of  By: Rogerio MD, Starlyn BIRCH.    Breast mass in female    s/p mammogram, u/s and biopsy in 05/09 c/w fibroadenoma,   Bronchitis    Chronic headache    Chronic low back pain 08/08/2008   Qualifier: Diagnosis of  By: Tanda  MD, Valerie     Chronic pain    normal work up including TSH, RPR, B12, HIV, plain films, 2 ESR's, ANA, CK, RF along with routine CBC, CMET and UA. Further work up includes CRP, ESR, SPEP/UPEP, hepatitis erology, A1C , repeat ANA   COPD (chronic obstructive pulmonary disease) (HCC)    COPD exacerbation (HCC) 03/31/2019   Depression    DUB (dysfunctional uterine bleeding)    and pelvic pain, with negative endometrial bx in 07/09 and transvaginal U/S significant for mild fibroids in 0/09.   Emphysema of lung (HCC)    GERD (gastroesophageal reflux disease)    Hallucinations 03/18/2008   Qualifier: Diagnosis of  By: Tanda  MD, [place]     Hyperlipidemia    Lower extremity edema    Neg ABI's, normal 2D echo, normal albumin   Menorrhagia     Ovarian cyst    Polysubstance abuse (HCC)    none since March 17,2009.   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 female with medical history significant for bipolar 1 disorder, asthma and COPD previously on home O2 being admitted with acute exacerbation for COPD. She was previously on O2 3L but has taken herself off in the last year, she does not check her pulse ox at home. 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  She inhaled a lot of dust and has been coughing and short of breath since then. Yesterday about midnight she had sudden worsening with chest tightness, wheezing and unable to catch her breath. She was able to drive herself to the ER where she required continuous neb, IV decadron , IV magnesium  and was placed on Bipap.  Prior cervical spine imaging showed  C4-C5  and C5-C6  end plate spurring.  Pt is s/p MBS 08/19/2022 that showed normal oropharyngeal swallow and sensation of retention in pharynx. Had submandibular stone at that time.  CXR negative.  Esophagram in 2020 showed esophageal dysmotility with esophageal stasis and mild reflux.  Swlalow eval ordered. Pt reports primary concerns being her stones - stating that they have recurred.    Assessment / Plan / Recommendation  Clinical  Impression  Patient presents with normal oropharyngeal swallow ability based on clinical swallow eval. She remains hoarse -- which she reports is exacerbated by dust exposure.  CN exam unremarkable and she has upper dentures - no lowers.  Pt easily passed 3 ounce Yale swallow screen and was observed with intake including water , 4 ounces applesauce and 2 cracker boluses.  She denies any dysphagia during this po and did not demonstrate any deficits.  Still reports occasional symptoms of food sticking pointing higher in throat requiring liquids to clear. Patient endorses issues with reflux and takes reflux medications daily.  Largest concern per pt is her concern for recurrence of submandibular stones - that she states are bilateral and cause her discomfort when consuming spicy foods.  Patient had her stones removed in [place] in 2023 by an oncology specialized ENT - Recommend she follow up with ENT as an OP to address this issue - and implored her to do so as she states she eats all of the time but is still losing weight. She does endorse occasional smoking when she drinks.  Recommend continue diet as tolerated. SLP Visit Diagnosis: Dysphagia, unspecified (R13.10)    Aspiration Risk  Mild aspiration risk    Diet Recommendation Regular;Thin liquid    Liquid Administration via: Cup;Straw Medication Administration:  (defer to pt and as medication appropriate) Supervision: Patient able to self feed Compensations: Slow rate;Small sips/bites;Follow solids with liquid Postural Changes: Seated upright at 90 degrees;Remain upright for at least 30 minutes after po intake    Other  Recommendations       Assistance Recommended at Discharge  N/a  Functional Status Assessment  N/a  Frequency and Duration     N/a       Prognosis   N/a     Swallow Study   General Date of Onset: 06/14/24 HPI: Kathleen [name] is a 63 y.o. female with medical history significant for bipolar 1 disorder, asthma and COPD  previously on home O2 being admitted with acute exacerbation for COPD. She was previously on O2 3L but has taken herself off in the last year, she does not check her pulse ox at home. 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 She inhaled a lot of dust and has been coughing and short of breath since then. Yesterday about midnight she had sudden worsening with chest tightness, wheezing and unable to catch her breath. She was able to drive herself to the ER where she required continuous neb, IV decadron , IV magnesium  and was placed on Bipap.  Prior cervical spine imaging showed  C4-C5  and C5-C6  end plate spurring.  Pt is s/p MBS 08/19/2022 that showed normal oropharyngeal swallow and sensation of retention in pharynx. Had submandibular stone at that time.  CXR negative.  Esophagram in 2020 showed esophageal dysmotility with esophageal stasis and mild reflux.  Swlalow eval ordered. Pt reports primary concerns being her stones - stating that they have recurred. Type of Study: Bedside Swallow Evaluation Diet Prior to this Study: Regular;Thin liquids (Level 0) Temperature Spikes Noted: No Respiratory Status: Nasal cannula History of Recent Intubation: No Behavior/Cognition: Alert Oral Cavity Assessment: Within Functional Limits Oral Care Completed by SLP: No Oral Cavity - Dentition: Dentures, top;Edentulous Vision: Functional for self-feeding Self-Feeding Abilities: Able to feed self Patient Positioning: Upright in bed Baseline Vocal Quality: Other (comment) (dysphonic) Volitional Cough: Strong Volitional Swallow: Able to elicit    Oral/Motor/Sensory Function Overall Oral Motor/Sensory Function: Within functional limits   Ice Chips Ice chips: Not tested   Thin Liquid Thin Liquid: Within functional limits Presentation: Straw;Self Fed    Nectar Thick Nectar Thick Liquid: Not tested   Honey Thick Honey Thick Liquid: Not  tested   Puree Puree: Within functional limits Presentation: Self Fed;Spoon   Solid     Solid: Within functional limits Presentation: Self Georgiana Nicolas Emmie Jenkins 06/14/2024,12:54 PM  Madelin POUR, MS [facility] SLP Acute Rehab Services Office [phone]

## 2024-06-14 NOTE — H&P (Signed)
 History and Physical  ABAGALE BOULOS FMW:[id number] DOB: Nov 10, 1961 DOA: 06/14/2024  PCP: Renne Homans, MD   Chief Complaint: Shortness of breath  HPI: Kathleen [name] is a 63 y.o. female with medical history significant for bipolar 1 disorder, asthma and COPD previously on home O2 being admitted with acute exacerbation for COPD. She was previously on O2 3L but has taken herself off in the last year, she does not check her pulse ox at home. 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 She inhaled a lot of dust and has been coughing and short of breath since then. Yesterday about midnight she had sudden worsening with chest tightness, wheezing and unable to catch her breath. She was able to drive herself to the ER where she required continuous neb, IV decadron , IV magnesium  and was placed on Bipap.   Review of Systems: Please see HPI for pertinent positives and negatives. A complete 10 system review of systems are otherwise negative.  Past Medical History:  Diagnosis Date   Acute respiratory failure with hypoxia and hypercapnia (HCC) 03/08/2023   Anxiety    Arthritis    hands   Asthma    2 sets of PFT's in 04/09 without sign variability. Last set with significant decrease in FEV1 with saline alone, suggesting Asthma but  recommended clinical corelation    Asthma    Asthma exacerbation 04/07/2023   Bipolar 1 disorder [facility])    therapist is Joy and is followed by [employer] [facility]   Blackout    negative work up including ESR, ANA, opthalmology referral, carotid dopplers, 2D echo, MRI and EEG.   BREAST LUMP 03/25/2008   Annotation: bilaterally Qualifier: Diagnosis of  By: Rogerio MD, Starlyn BIRCH.    Breast mass in female    s/p mammogram, u/s and biopsy in 05/09 c/w fibroadenoma,   Bronchitis    Chronic headache    Chronic low back pain 08/08/2008   Qualifier: Diagnosis of  By: Tanda  MD, Valerie      Chronic pain    normal work up including TSH, RPR, B12, HIV, plain films, 2 ESR's, ANA, CK, RF along with routine CBC, CMET and UA. Further work up includes CRP, ESR, SPEP/UPEP, hepatitis erology, A1C , repeat ANA   COPD (chronic obstructive pulmonary disease) (HCC)    COPD exacerbation (HCC) 03/31/2019   Depression    DUB (dysfunctional uterine bleeding)    and pelvic pain, with negative endometrial bx in 07/09 and transvaginal U/S significant for mild fibroids in 0/09.   Emphysema of lung (HCC)    GERD (gastroesophageal reflux disease)    Hallucinations 03/18/2008   Qualifier: Diagnosis of  By: Tanda  MD, [place]     Hyperlipidemia    Lower extremity edema    Neg ABI's, normal 2D echo, normal albumin   Menorrhagia    Ovarian cyst    Polysubstance abuse (HCC)    none since March 17,2009.   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 She started smoking about 37 years ago. She has a 7.5 pack-year smoking history. She has never used smokeless tobacco. She reports current alcohol use. She reports current drug use. Drug: Marijuana.  Allergies  Allergen Reactions   Bee Venom Anaphylaxis   Peach [Prunus Persica] Anaphylaxis, Swelling and Other (See Comments)    Throat swells , breaks out   Topamax [Topiramate] Other (See Comments)    Hallucinations     Tramadol Nausea Only    Family History  Problem Relation Age of Onset   Colon cancer Mother 7   Breast cancer Mother 62   Rectal cancer Mother    Diabetes Father    Hypertension Father    Kidney disease Father    Colon cancer Father 67   Prostate cancer Father    Cancer Father    Kidney disease Sister    Cancer Brother    Breast cancer Maternal Grandmother 103   Esophageal cancer Neg Hx    Stomach cancer Neg Hx      Prior to Admission medications   Medication Sig Start Date End Date Taking? Authorizing Provider  albuterol  (PROVENTIL ) (2.5 MG/3ML) 0.083% nebulizer solution Use 1 vial (2.5 mg total) by nebulization every 4 (four) hours as needed for wheezing or shortness of breath. 04/08/23   Nooruddin, Saad, MD  albuterol  (VENTOLIN  HFA) 108 (90 Base) MCG/ACT inhaler Inhale 2 puffs into the lungs every 6 (six) hours as needed for wheezing or shortness of breath. 04/08/23   Nooruddin, Roetta, MD  albuterol  (VENTOLIN  HFA) 108 (90 Base) MCG/ACT inhaler Inhale 1-2 puffs into the lungs every 6 (six) hours as needed for wheezing or shortness of breath. 04/17/24   Palumbo, April, MD  aspirin  EC 81 MG tablet Take 81 mg by mouth daily. Swallow whole.    [provider]  Cyanocobalamin (B-12 PO) Take 1 tablet by mouth daily.    [provider]  EPINEPHrine  0.3 mg/0.3 mL IJ SOAJ injection Inject 0.3 mg into the muscle as needed for anaphylaxis. 08/19/23   Freddi Hamilton, MD  guaiFENesin -dextromethorphan  (ROBITUSSIN DM) 100-10 MG/5ML syrup Take 10 mLs by mouth every 4 (four) hours as needed for cough. 03/10/23   Gherghe, Costin M, MD  hydrOXYzine  (ATARAX ) 25 MG tablet Take 1 tablet (25 mg total) by mouth 3 (three) times daily as needed for anxiety. 05/19/23   Nooruddin, Saad, MD  ipratropium-albuterol  (DUONEB) 0.5-2.5 (3) MG/3ML SOLN USE 1 AMPULE IN NEBULIZER EVERY 2 HOURS AS NEEDED 06/07/24   Renne Homans, MD  lidocaine  (LIDODERM ) 5 % Place 1 patch onto the skin daily. Remove & Discard  patch within 12 hours or as directed by MD 06/13/24   Renne Homans, MD  loratadine  (CLARITIN ) 10 MG tablet Take 1 tablet (10 mg total) by mouth daily. 02/01/23   Samtani, Jai-Gurmukh, MD  ondansetron  (ZOFRAN ) 4 MG tablet TAKE 1 TABLET BY MOUTH ONCE DAILY AS NEEDED FOR NAUSEA FOR VOMITING 06/07/24   Amoako, Prince, MD  oxyCODONE -acetaminophen  (PERCOCET/ROXICET) 5-325 MG tablet Take by mouth every 4 (four) hours as needed for severe pain (pain score 7-10).    [provider]  pantoprazole  (PROTONIX ) 40 MG tablet Take 1 tablet (40 mg total) by mouth 2 (two) times daily before a meal. 02/01/23   Samtani, Jai-Gurmukh, MD  predniSONE  (DELTASONE ) 20 MG tablet Take 40 mg by mouth daily for 3 days, then 20mg  by mouth daily for 3 days, then 10mg  daily for 3 days 05/26/24   Jarold Olam HERO, PA-C  TRELEGY ELLIPTA  200-62.5-25 MCG/ACT AEPB INHALE 1 PUFF INTO THE LUNGS ONCE DAILY 06/11/24   Renne Homans, MD    Physical Exam: BP (!) 137/112 (BP Location: Right Arm)   Pulse (!) 103   Temp 98.2 F (36.8 C) (Oral)   Resp (!) 23   Ht 5' 3 (1.6 m)   Wt 58.5 kg   LMP 11/08/2014   SpO2 95%   BMI 22.85 kg/m  General:  Alert, oriented, calm, in no acute distress. Just returned from bathroom and very dyspneic, tachypneic. O2 saturation 90% on room air, being placed back on O2. Cardiovascular: RRR, no murmurs or rubs, no peripheral edema  Respiratory: distant breath sounds with equal bilateral air movement. Diffuse rhonchi and wheezing, able to speak a few words at a time.  Abdomen: soft, nontender, nondistended, normal bowel tones heard  Skin: dry, no rashes  Musculoskeletal: no joint effusions, normal range of motion  Psychiatric: appropriate affect, normal speech  Neurologic: extraocular muscles intact, clear speech, moving all extremities with intact sensorium          Labs on Admission:  Basic Metabolic Panel: Recent Labs  Lab 06/14/24 0433  NA 136  K 4.2  CL 104  CO2 23  GLUCOSE 92  BUN 22   CREATININE 1.13*  CALCIUM  9.1   Liver Function Tests: No results for input(s): AST, ALT, ALKPHOS, BILITOT, PROT, ALBUMIN in the last 168 hours. No results for input(s): LIPASE, AMYLASE in the last 168 hours. No results for input(s): AMMONIA in the last 168 hours. CBC: Recent Labs  Lab 06/14/24 0433  WBC 9.2  HGB 14.5  HCT 44.9  MCV 85.4  PLT 317   Cardiac Enzymes: No results for input(s): CKTOTAL, CKMB, CKMBINDEX, TROPONINI in the last 168 hours. BNP (last 3 results) Recent Labs    02/24/24 1823  BNP 66.2    ProBNP (last 3 results) No results for input(s): PROBNP in the last 8760 hours.  CBG: No results for input(s): GLUCAP in the last 168 hours.  Radiological Exams on Admission: DG Chest Port 1 View Result Date: 06/14/2024 CLINICAL DATA:  63 year old female with shortness of breath. EXAM: PORTABLE CHEST 1 VIEW COMPARISON:  Portable chest 05/26/2024 and earlier. FINDINGS: Portable AP semi upright view at 0423 hours. Larger lung volumes and mildly rotated to the left. Normal cardiac size and mediastinal contours. Visualized tracheal air column is within normal limits. Allowing for portable technique the lungs are clear. No pneumothorax or pleural effusion. No acute osseous abnormality identified. Negative visible bowel gas. IMPRESSION: No acute cardiopulmonary abnormality. Electronically Signed   By: VEAR Hurst M.D.   On: 06/14/2024 04:56   Assessment/Plan BROCK MOKRY is a 63 y.o. female with medical history significant for bipolar 1 disorder, asthma and COPD previously on home O2 being admitted with acute exacerbation for COPD.  Acute COPD exacerbation - with asthma overlap, with cough, dyspnea, hypoxia. 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  - obs admission - continue scheduled duonebs and PRN albuterol  - IV solumedrol - supplemental O2 with goal >88%, will plan  for  desat study prior to DC as she likely needs home O2  Recent left metacarpal fracture - Percocet as needed for pain  GERD - protonix   DVT prophylaxis: Lovenox      Code Status: Full Code  Consults called: None  Admission status: Observation   Time spent: 48 minutes  Kanchan Gal CHRISTELLA Gail MD [facility] Pager [phone]  If 7PM-7AM, please contact night-coverage [email or web address] Password [email or web address]  06/14/2024, 7:39 AM

## 2024-06-14 NOTE — Plan of Care (Signed)

## 2024-06-14 NOTE — ED Provider Notes (Signed)
 [facility] Provider Note   CSN: [id number] Arrival date & time: 06/14/24  0404     Patient presents with: Shortness of Breath   Kathleen [name] is a 63 y.o. female.   HPI     This is a 63 year old female with a history of COPD who presents with shortness of breath.  Reports onset of significant shortness of breath around 0030.  Reports cough without fevers.  Has used her nebulizer multiple times without relief.  Endorses some dizziness and chest pain.  Was previously on 3 L of oxygen .  States that she only uses oxygen  as needed.  Prior to Admission medications   Medication Sig Start Date End Date Taking? Authorizing Provider  albuterol  (PROVENTIL ) (2.5 MG/3ML) 0.083% nebulizer solution Use 1 vial (2.5 mg total) by nebulization every 4 (four) hours as needed for wheezing or shortness of breath. 04/08/23   Nooruddin, Saad, MD  albuterol  (VENTOLIN  HFA) 108 (90 Base) MCG/ACT inhaler Inhale 2 puffs into the lungs every 6 (six) hours as needed for wheezing or shortness of breath. 04/08/23   Nooruddin, Roetta, MD  albuterol  (VENTOLIN  HFA) 108 (90 Base) MCG/ACT inhaler Inhale 1-2 puffs into the lungs every 6 (six) hours as needed for wheezing or shortness of breath. 04/17/24   Palumbo, April, MD  aspirin  EC 81 MG tablet Take 81 mg by mouth daily. Swallow whole.    [provider]  Cyanocobalamin (B-12 PO) Take 1 tablet by mouth daily.    [provider]  EPINEPHrine  0.3 mg/0.3 mL IJ SOAJ injection Inject 0.3 mg into the muscle as needed for anaphylaxis. 08/19/23   Freddi Hamilton, MD  guaiFENesin -dextromethorphan  (ROBITUSSIN DM) 100-10 MG/5ML syrup Take 10 mLs by mouth every 4 (four) hours as needed for cough. 03/10/23   Gherghe, Costin M, MD  hydrOXYzine  (ATARAX ) 25 MG tablet Take 1 tablet (25 mg total) by mouth 3 (three) times daily as needed for anxiety. 05/19/23   Nooruddin, Saad, MD  ipratropium-albuterol  (DUONEB) 0.5-2.5 (3) MG/3ML SOLN  USE 1 AMPULE IN NEBULIZER EVERY 2 HOURS AS NEEDED 06/07/24   Renne Homans, MD  lidocaine  (LIDODERM ) 5 % Place 1 patch onto the skin daily. Remove & Discard patch within 12 hours or as directed by MD 06/13/24   Renne Homans, MD  loratadine  (CLARITIN ) 10 MG tablet Take 1 tablet (10 mg total) by mouth daily. 02/01/23   Samtani, Jai-Gurmukh, MD  ondansetron  (ZOFRAN ) 4 MG tablet TAKE 1 TABLET BY MOUTH ONCE DAILY AS NEEDED FOR NAUSEA FOR VOMITING 06/07/24   Amoako, Prince, MD  oxyCODONE -acetaminophen  (PERCOCET/ROXICET) 5-325 MG tablet Take by mouth every 4 (four) hours as needed for severe pain (pain score 7-10).    [provider]  pantoprazole  (PROTONIX ) 40 MG tablet Take 1 tablet (40 mg total) by mouth 2 (two) times daily before a meal. 02/01/23   Samtani, Jai-Gurmukh, MD  predniSONE  (DELTASONE ) 20 MG tablet Take 40 mg by mouth daily for 3 days, then 20mg  by mouth daily for 3 days, then 10mg  daily for 3 days 05/26/24   Jarold Olam HERO, PA-C  TRELEGY ELLIPTA  200-62.5-25 MCG/ACT AEPB INHALE 1 PUFF INTO THE LUNGS ONCE DAILY 06/11/24   Amoako, Prince, MD    Allergies: Bee venom, Peach [prunus persica], Topamax [topiramate], and Tramadol    Review of Systems  Constitutional:  Negative for fever.  Respiratory:  Positive for cough, shortness of breath and wheezing.   Cardiovascular:  Positive for chest pain. Negative for leg  swelling.  All other systems reviewed and are negative.   Updated Vital Signs BP (!) 137/112 (BP Location: Right Arm)   Pulse (!) 103   Temp 98.2 F (36.8 C) (Oral)   Resp (!) 23   Ht 1.6 m (5' 3)   Wt 58.5 kg   LMP 11/08/2014   SpO2 95%   BMI 22.85 kg/m   Physical Exam Vitals and nursing note reviewed.  Constitutional:      Appearance: She is well-developed. She is ill-appearing. She is not diaphoretic.     Comments: Tripoding, tachypneic  HENT:     Head: Normocephalic and atraumatic.  Eyes:     Pupils: Pupils are equal, round, and reactive to light.   Cardiovascular:     Rate and Rhythm: Regular rhythm. Tachycardia present.     Heart sounds: Normal heart sounds.  Pulmonary:     Effort: Pulmonary effort is normal. No respiratory distress.     Breath sounds: Wheezing present.     Comments: Speaking in short sentences, tachypnea, tripoding noted Abdominal:     General: Bowel sounds are normal.     Palpations: Abdomen is soft.  Musculoskeletal:     Cervical back: Neck supple.     Right lower leg: No edema.  Skin:    General: Skin is warm and dry.  Neurological:     Mental Status: She is alert and oriented to person, place, and time.  Psychiatric:        Mood and Affect: Mood normal.     (all labs ordered are listed, but only abnormal results are displayed) Labs Reviewed  BASIC METABOLIC PANEL WITH GFR - Abnormal; Notable for the following components:      Result Value   Creatinine, Ser 1.13 (*)    GFR, Estimated 55 (*)    All other components within normal limits  CBC - Abnormal; Notable for the following components:   RBC 5.26 (*)    All other components within normal limits  RESP PANEL BY RT-PCR (RSV, FLU A&B, COVID)  [id number]  TROPONIN I (HIGH SENSITIVITY)    EKG: EKG Interpretation Date/Time:  Thursday June 14 2024 04:20:58 EDT Ventricular Rate:  105 PR Interval:  147 QRS Duration:  76 QT Interval:  386 QTC Calculation: 511 R Axis:   -10  Text Interpretation: Sinus tachycardia Prolonged QT interval Confirmed by Bari Pfeiffer ([id number]) on 06/14/2024 4:54:13 AM  Radiology: [facility] Chest Port 1 View Result Date: 06/14/2024 CLINICAL DATA:  63 year old female with shortness of breath. EXAM: PORTABLE CHEST 1 VIEW COMPARISON:  Portable chest 05/26/2024 and earlier. FINDINGS: Portable AP semi upright view at 0423 hours. Larger lung volumes and mildly rotated to the left. Normal cardiac size and mediastinal contours. Visualized tracheal air column is within normal limits. Allowing for portable technique the lungs are clear. No  pneumothorax or pleural effusion. No acute osseous abnormality identified. Negative visible bowel gas. IMPRESSION: No acute cardiopulmonary abnormality. Electronically Signed   By: VEAR Hurst M.D.   On: 06/14/2024 04:56     .Critical Care  Performed by: Bari Pfeiffer FALCON, MD Authorized by: Bari Pfeiffer FALCON, MD   Critical care provider statement:    Critical care time (minutes):  45   Critical care was necessary to treat or prevent imminent or life-threatening deterioration of the following conditions:  Respiratory failure   Critical care was time spent personally by me on the following activities:  Development of treatment plan with patient or surrogate, discussions with consultants, evaluation  of patient's response to treatment, examination of patient, ordering and review of laboratory studies, ordering and review of radiographic studies, ordering and performing treatments and interventions, pulse oximetry, re-evaluation of patient's condition and review of old charts    Medications Ordered in the ED  dexamethasone  (DECADRON ) injection 10 mg (10 mg Intravenous Given 06/14/24 0439)  magnesium  sulfate IVPB 2 g 50 mL (0 g Intravenous Stopped 06/14/24 0541)  albuterol  (PROVENTIL ) (2.5 MG/3ML) 0.083% nebulizer solution (10 mg/hr Nebulization Given 06/14/24 0506)  ipratropium (ATROVENT ) nebulizer solution 0.5 mg (0.5 mg Nebulization Given 06/14/24 0437)  ketorolac  (TORADOL ) 30 MG/ML injection 30 mg (30 mg Intravenous Given 06/14/24 0606)    Clinical Course as of 06/14/24 0617  Thu Jun 14, 2024  0615 Patient states she feels somewhat better after BiPAP.  Still has coarse wheezing breath sounds bilaterally.  Mild tachypnea.  Wean to nasal cannula. [CH]    Clinical Course User Index [CH] Janeane Cozart, Charmaine FALCON, MD                                 Medical Decision Making Amount and/or Complexity of Data Reviewed Labs: ordered. Radiology: ordered.  Risk Prescription drug management. Decision regarding  hospitalization.   This patient presents to the ED for concern of shortness of breath, this involves an extensive number of treatment options, and is a complaint that carries with it a high risk of complications and morbidity.  I considered the following differential and admission for this acute, potentially life threatening condition.  The differential diagnosis includes COPD, CHF, pneumothorax, pneumonia, atypical ACS  MDM:    This is a 63 year old female who presents with shortness of breath.  History of asthma/COPD.  She is tripoding and tachypneic with respiratory distress noted on initial evaluation.  Wheezing in all lung fields.  Patient was placed on BiPAP for comfort and work of breathing.  Given Decadron  and magnesium .  Also started on continuous nebulizer.  Chest x-ray shows no evidence of acute pneumonia or pneumothorax.  EKG shows no evidence of arrhythmia or ischemia.  Basic lab work is reassuring.  On recheck, patient states she felt improved after BiPAP.  She was weaned to nasal cannula.  She still has significant wheezing and coarse breath sounds bilaterally.  Feel she will need admission for frequent nebs and likely COPD exacerbation.  (Labs, imaging, consults)  Labs: I Ordered, and personally interpreted labs.  The pertinent results include: CBC, BMP, troponin, viral panel  Imaging Studies ordered: I ordered imaging studies including chest x-ray I independently visualized and interpreted imaging. I agree with the radiologist interpretation  Additional history obtained from chart review.  External records from outside source obtained and reviewed including prior hospitalizations  Cardiac Monitoring: The patient was maintained on a cardiac monitor.  If on the cardiac monitor, I personally viewed and interpreted the cardiac monitored which showed an underlying rhythm of: Sinus  Reevaluation: After the interventions noted above, I reevaluated the patient and found that they  have :stayed the same  Social Determinants of Health:  lives independently  Disposition: Admit  Co morbidities that complicate the patient evaluation  Past Medical History:  Diagnosis Date   Acute respiratory failure with hypoxia and hypercapnia (HCC) 03/08/2023   Anxiety    Arthritis    hands   Asthma    2 sets of PFT's in 04/09 without sign variability. Last set with significant decrease in FEV1 with saline alone,  suggesting Asthma but  recommended clinical corelation    Asthma    Asthma exacerbation 04/07/2023   Bipolar 1 disorder [facility])    therapist is Joy and is followed by [facility]   Blackout    negative work up including ESR, ANA, opthalmology referral, carotid dopplers, 2D echo, MRI and EEG.   BREAST LUMP 03/25/2008   Annotation: bilaterally Qualifier: Diagnosis of  By: Rogerio MD, Starlyn BIRCH.    Breast mass in female    s/p mammogram, u/s and biopsy in 05/09 c/w fibroadenoma,   Bronchitis    Chronic headache    Chronic low back pain 08/08/2008   Qualifier: Diagnosis of  By: Tanda  MD, Valerie     Chronic pain    normal work up including TSH, RPR, B12, HIV, plain films, 2 ESR's, ANA, CK, RF along with routine CBC, CMET and UA. Further work up includes CRP, ESR, SPEP/UPEP, hepatitis erology, A1C , repeat ANA   COPD (chronic obstructive pulmonary disease) (HCC)    COPD exacerbation (HCC) 03/31/2019   Depression    DUB (dysfunctional uterine bleeding)    and pelvic pain, with negative endometrial bx in 07/09 and transvaginal U/S significant for mild fibroids in 0/09.   Emphysema of lung (HCC)    GERD (gastroesophageal reflux disease)    Hallucinations 03/18/2008   Qualifier: Diagnosis of  By: Tanda  MD, [place]     Hyperlipidemia    Lower extremity edema    Neg ABI's, normal 2D echo, normal albumin   Menorrhagia    Ovarian cyst    Polysubstance abuse (HCC)    none since March 17,2009.   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   Tubular adenoma of colon      Medicines Meds ordered this encounter  Medications   dexamethasone  (DECADRON ) injection 10 mg   magnesium  sulfate IVPB 2 g 50 mL   albuterol  (PROVENTIL ) (2.5 MG/3ML) 0.083% nebulizer solution   ipratropium (ATROVENT ) nebulizer solution 0.5 mg   albuterol  (PROVENTIL ) (2.5 MG/3ML) 0.083% nebulizer solution    Hudson, Joshua E: cabinet override   ketorolac  (TORADOL ) 30 MG/ML injection 30 mg    I have reviewed the patients home medicines and have made adjustments as needed  Problem List / ED Course: Problem List Items Addressed This Visit       Respiratory   COPD exacerbation (HCC) - Primary   Other Visit Diagnoses       Acute respiratory failure with hypoxia (HCC)                    Final diagnoses:  COPD exacerbation (HCC)  Acute respiratory failure with hypoxia [facility])    ED Discharge Orders     None          Bari Charmaine FALCON, MD 06/14/24 [phone]

## 2024-06-14 NOTE — Hospital Course (Addendum)
 63yo with COPD/asthma overlapy previously on 3L home O2 who presented on 7/3 with SOB.  Due to work of breathing and respiratory distress on presentation  she was started on BiPAP.  Given nebs, steroids, and magnesium .  Weaned back to [place] O2 and then to room air.

## 2024-06-14 NOTE — Progress Notes (Signed)
   06/14/24 0507  BiPAP/CPAP/SIPAP  $ Non-Invasive Home Ventilator  Initial  $ Face Mask Medium Yes  BiPAP/CPAP/SIPAP Pt Type Adult  BiPAP/CPAP/SIPAP SERVO  Mask Type Full face mask  Dentures removed? Not applicable  Mask Size Medium  Set Rate 10 breaths/min  Respiratory Rate 30 breaths/min  IPAP 12 cmH20  EPAP 6 cmH2O  Pressure Support 6 cmH20  PEEP 6 cmH20  FiO2 (%) 30 %  Flow Rate 0 lpm  Minute Ventilation 14.3  Leak 1  Peak Inspiratory Pressure (PIP) 13  Tidal Volume (Vt) 726  Patient Home Machine No  Patient Home Mask No  Patient Home Tubing No  Auto Titrate No  Press High Alarm 26 cmH2O  Device Plugged into RED Power Outlet Yes

## 2024-06-14 NOTE — ED Notes (Addendum)
 Patient given turkey biscuit, soda and juice

## 2024-06-15 DIAGNOSIS — J441 Chronic obstructive pulmonary disease with (acute) exacerbation: Secondary | ICD-10-CM | POA: Diagnosis not present

## 2024-06-15 DIAGNOSIS — F1410 Cocaine abuse, uncomplicated: Secondary | ICD-10-CM | POA: Diagnosis present

## 2024-06-15 LAB — URINALYSIS, ROUTINE W REFLEX MICROSCOPIC
Bacteria, UA: NONE SEEN
Bilirubin Urine: NEGATIVE
Glucose, UA: NEGATIVE mg/dL
Hgb urine dipstick: NEGATIVE
Ketones, ur: 5 mg/dL — AB
Nitrite: NEGATIVE
Protein, ur: NEGATIVE mg/dL
Specific Gravity, Urine: 1.029 (ref 1.005–1.030)
pH: 5 (ref 5.0–8.0)

## 2024-06-15 LAB — CBC
HCT: 39.4 % (ref 36.0–46.0)
Hemoglobin: 12.7 g/dL (ref 12.0–15.0)
MCH: 27.6 pg (ref 26.0–34.0)
MCHC: 32.2 g/dL (ref 30.0–36.0)
MCV: 85.7 fL (ref 80.0–100.0)
Platelets: 277 K/uL (ref 150–400)
RBC: 4.6 MIL/uL (ref 3.87–5.11)
RDW: 13.6 % (ref 11.5–15.5)
WBC: 11.7 K/uL — ABNORMAL HIGH (ref 4.0–10.5)
nRBC: 0 % (ref 0.0–0.2)

## 2024-06-15 LAB — BASIC METABOLIC PANEL WITH GFR
Anion gap: 11 (ref 5–15)
BUN: 27 mg/dL — ABNORMAL HIGH (ref 8–23)
CO2: 23 mmol/L (ref 22–32)
Calcium: 8.7 mg/dL — ABNORMAL LOW (ref 8.9–10.3)
Chloride: 102 mmol/L (ref 98–111)
Creatinine, Ser: 1.03 mg/dL — ABNORMAL HIGH (ref 0.44–1.00)
GFR, Estimated: >60 mL/min (ref >60)
Glucose, Bld: 122 mg/dL — ABNORMAL HIGH (ref 70–99)
Potassium: 4 mmol/L (ref 3.5–5.1)
Sodium: 136 mmol/L (ref 135–145)

## 2024-06-15 MED ORDER — HYDROXYZINE HCL 25 MG PO TABS
25.0000 mg | ORAL_TABLET | Freq: Four times a day (QID) | ORAL | Status: AC | PRN
  Administered 2024-06-15 (×2): 25 mg via ORAL
  Filled 2024-06-15 (×2): qty 1

## 2024-06-15 MED ORDER — ALBUTEROL SULFATE (2.5 MG/3ML) 0.083% IN NEBU: 2.5000 mg | INHALATION_SOLUTION | RESPIRATORY_TRACT | 0 refills | Status: DC | PRN

## 2024-06-15 MED ORDER — IPRATROPIUM-ALBUTEROL 0.5-2.5 (3) MG/3ML IN SOLN: 3.0000 mL | Freq: Four times a day (QID) | RESPIRATORY_TRACT | 0 refills | Status: DC

## 2024-06-15 MED ORDER — GUAIFENESIN-DM 100-10 MG/5ML PO SYRP: 5.0000 mL | ORAL_SOLUTION | ORAL | 0 refills | Status: DC | PRN

## 2024-06-15 MED ORDER — IPRATROPIUM-ALBUTEROL 0.5-2.5 (3) MG/3ML IN SOLN
3.0000 mL | RESPIRATORY_TRACT | Status: DC
  Administered 2024-06-15 (×3): 3 mL via RESPIRATORY_TRACT
  Filled 2024-06-15 (×3): qty 3

## 2024-06-15 MED ORDER — PREDNISONE 20 MG PO TABS: ORAL_TABLET | ORAL | 0 refills | Status: DC

## 2024-06-15 NOTE — Progress Notes (Signed)
 Patient ambulated in the hallway less than 15 minutes on room air, oxygen  saturation at 97 percent, breathing regular, unlabored, not in distress.

## 2024-06-15 NOTE — TOC Transition Note (Signed)
 Transition of Care [facility]) - Discharge Note   Patient Details  Name: Kathleen [name] MRN: [id number] Date of Birth: 1961-10-26  Transition of Care [facility]) CM/SW Contact:  Doneta Glenys DASEN, RN Phone Number: 06/15/2024, 2:06 PM   Clinical Narrative:    Payient medically ready for discharge per MD. CM ordered DME - nebulizer with [employer] ([place]) and requested a oxygen  tank for patient to have at home if needed. TOC signing off   Final next level of care: Home/Self Care Barriers to Discharge: Barriers Resolved   Patient Goals and CMS Choice Patient states their goals for this hospitalization and ongoing recovery are:: home with niece CMS Medicare.gov Compare Post Acute Care list provided to::  (na) Choice offered to / list presented to : NA [place] ownership interest in [facility].provided to:: Parent NA    Discharge Placement                       Discharge Plan and Services Additional resources added to the After Visit Summary for   In-house Referral: NA Discharge Planning Services: CM Consult Post Acute Care Choice: NA          DME Arranged: Nebulizer machine DME Agency: [employer] Date DME Agency Contacted: 06/15/24 Time DME Agency Contacted: 0100 Representative spoke with at DME Agency: [place] HH Arranged: NA HH Agency: NA        Social Drivers of Health (SDOH) Interventions SDOH Screenings   Food Insecurity: No Food Insecurity (06/14/2024)  Housing: High Risk (06/14/2024)  Transportation Needs: No Transportation Needs (06/14/2024)  Utilities: Not At Risk (06/14/2024)  Alcohol Screen: Low Risk  (05/19/2023)  Depression (PHQ2-9): High Risk (05/19/2023)  Financial Resource Strain: Medium Risk (05/19/2023)  Physical Activity: Sufficiently Active (05/19/2023)  Social Connections: Moderately Integrated (06/14/2024)  Stress: Stress Concern Present (05/19/2023)  Tobacco Use: High Risk (05/26/2024)     Readmission Risk Interventions    04/08/2023     2:51 PM 03/08/2023    4:05 PM 02/01/2023    1:33 PM  Readmission Risk Prevention Plan  Transportation Screening  Complete Complete  PCP or Specialist Appt within 3-5 Days  Complete   HRI or Home Care Consult  Complete   Social Work Consult for Recovery Care Planning/Counseling  Complete   Palliative Care Screening  Not Applicable   Medication Review Oceanographer)  Complete Complete  PCP or Specialist appointment within 3-5 days of discharge     HRI or Home Care Consult   Complete  SW Recovery Care/Counseling Consult   Complete  Palliative Care Screening   Not Applicable  Skilled Nursing Facility   Not Applicable     Information is confidential and restricted. Go to Review Flowsheets to unlock data.

## 2024-06-15 NOTE — Progress Notes (Signed)
 1330H AVS form given and explained to the patient. Patient requesting for urine culture/ urinalysis prior to discharge, patient verbalized she must have a UTI. Provider notified, uranalysis sent.   1400H Urinalysis negative for UTI as per provider, patient clear for DC. Patient awaiting for her niece for transport.   1700H Oxygen  tank and neb kit delivered at station for patient as arranged by case Production designer, theatre/television/film. As per provider,  patient does not qualify for oxygen  to take home, patient tolerates ambulation on room air maintaining oxygen  saturation at 97 percent, breathing regular and unlabored, not in distress. As per patient, provider explained to her that she does not qualify for oxygen  to take home with her. Patient also stated that she has a nebulization kit at home.

## 2024-06-15 NOTE — Plan of Care (Signed)

## 2024-06-15 NOTE — Progress Notes (Signed)
 Patient requested breathing treatment which was provided by nursing staff. At that time patient inquired about Bipap at night for sleep. States MD on day team said he placed an order. The order is placed although per respiratory it is for use only in patient distress. RT spoke with NP concerning patient status and assessment and it was decided to give patient medication for anxiety. Patient agreed to take medication for anxiety, she states only because this situation is about to set me off Patient was packing her belongings when I arrived to room and states she wants to move to the 4th floor where she can be monitored on the bipap. She states she knows what she needs. She also stated, Let them know I am bipolar. Patient was up pacing room as this RN left the room. Patient requested to speak with NP at bedside. Message sent to NP.

## 2024-06-15 NOTE — Progress Notes (Signed)
   06/15/24 1315  Spiritual Encounters  Type of Visit Initial  Referral source Social worker/Care management/TOC  Reason for visit Advance directives  OnCall Visit No   Per referral of CM, Tanya, I visited with Ms. Younge to assist with HCPOA.  Ms. Vanaken debriefed with me aspects of her life and family, including many losses and today being her late mother's birthday. She reflected on life goals and plans and also the role of her faith as a source of meaning and strength.  I provided compassionate presence and relational support, active listening and affirmation. I offered prayer at Ms. Hollerbach's request and also provided a bible. I left  the HCPOA form with her with guidance for completing.  [place] Ndiaye L. Fredrica, M.Div [phone]

## 2024-06-15 NOTE — Progress Notes (Addendum)
SATURATION QUALIFICATIONS: (This note is used to comply with regulatory documentation for home oxygen)  Patient Saturations on Room Air at Rest = 97%  Patient Saturations on Room Air while Ambulating = 97%  

## 2024-06-15 NOTE — Progress Notes (Signed)
 Called for patient requesting BiPAP. VSS and NAD noted. Wheezing bilaterally. Anxious in conversation. Talking in complete sentences. Declining CPAP but demanding BiPAP with a forceful rate so I don't have to breath. NP made aware of patient request. BiPAP not available with patient current bed assignment but also not indicated at this time per RT assessment.

## 2024-06-15 NOTE — TOC Initial Note (Signed)
 Transition of Care [facility]) - Initial/Assessment Note    Patient Details  Name: Kathleen [name] MRN: [id number] Date of Birth: 1961-04-10  Transition of Care [facility]) CM/SW Contact:    Doneta Glenys DASEN, RN Phone Number: 06/15/2024, 8:23 AM  Clinical Narrative:                 PTA lives with niece ina house;Verified PCP/insurance;DME- oxygen  at 3L [place] with [employer];nebulizer. RW,; Denies HH or SDOH needs; Patients niece will transport home at discharge. CM discussed advance directive, will place chaplain consult.  Expected Discharge Plan: Home/Self Care Barriers to Discharge: No Barriers Identified   Patient Goals and CMS Choice Patient states their goals for this hospitalization and ongoing recovery are:: home with niece CMS Medicare.gov Compare Post Acute Care list provided to::  (na) Choice offered to / list presented to : NA [place] ownership interest in [facility].provided to:: Parent NA    Expected Discharge Plan and Services In-house Referral: NA Discharge Planning Services: CM Consult Post Acute Care Choice: NA Living arrangements for the past 2 months: Single Family Home                 DME Arranged: N/A DME Agency: NA       HH Arranged: NA HH Agency: NA        Prior Living Arrangements/Services Living arrangements for the past 2 months: Single Family Home Lives with:: Other (Comment) (niece) Patient language and need for interpreter reviewed:: Yes Do you feel safe going back to the place where you live?: Yes      Need for Family Participation in Patient Care: No (Comment) Care giver support system in place?: Yes (comment) Current home services: DME (O2, [employer], [facility] Nebulizer) Criminal Activity/Legal Involvement Pertinent to Current Situation/Hospitalization: No - Comment as needed  Activities of Daily Living   ADL Screening (condition at time of admission) Independently performs ADLs?: Yes (appropriate for developmental age) Is the patient deaf or  have difficulty hearing?: No Does the patient have difficulty seeing, even when wearing glasses/contacts?: No Does the patient have difficulty concentrating, remembering, or making decisions?: No  Permission Sought/Granted Permission sought to share information with : Case Manager Permission granted to share information with : Yes, Verbal Permission Granted     Permission granted to share info w AGENCY: [employer]        Emotional Assessment Appearance:: Appears stated age Attitude/Demeanor/Rapport: Engaged Affect (typically observed): Appropriate Orientation: : Oriented to Self, Oriented to Place, Oriented to  Time, Oriented to Situation Alcohol / Substance Use: Not Applicable Psych Involvement: No (comment)  Admission diagnosis:  COPD exacerbation (HCC) [J44.1] Acute respiratory failure with hypoxia (HCC) [J96.01] COPD with acute exacerbation (HCC) [J44.1] Patient Active Problem List   Diagnosis Date Noted   COPD with acute exacerbation (HCC) 06/14/2024   Urinary incontinence 03/07/2024   Cervical radiculopathy 05/22/2023   Chronic back pain 03/08/2023   OSA (obstructive sleep apnea) 03/08/2023   COPD exacerbation (HCC) 01/30/2023   Syncope and collapse 01/30/2023   Malnutrition of moderate degree 11/17/2022   Marijuana abuse 08/17/2022   Weight loss 07/07/2022   Sialoadenitis 03/16/2022   Skin growth 08/28/2021   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   Daytime somnolence 04/06/2019   Lumbar radiculopathy 07/26/2016   Depression 09/19/2015   Healthcare maintenance 06/18/2014   Tobacco abuse 03/13/2013   GERD 01/21/2010   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   Generalized anxiety disorder 03/14/2008   Migraine variant 03/14/2008   PCP:  Renne Homans, MD Pharmacy:   [facility] [address] [place] - [address] [place] [address] Phone: [phone] Fax: [phone]     Social Drivers of Health  (SDOH) Social History: SDOH Screenings   Food Insecurity: No Food Insecurity (06/14/2024)  Housing: High Risk (06/14/2024)  Transportation Needs: No Transportation Needs (06/14/2024)  Utilities: Not At Risk (06/14/2024)  Alcohol Screen: Low Risk  (05/19/2023)  Depression (PHQ2-9): High Risk (05/19/2023)  Financial Resource Strain: Medium Risk (05/19/2023)  Physical Activity: Sufficiently Active (05/19/2023)  Social Connections: Moderately Integrated (06/14/2024)  Stress: Stress Concern Present (05/19/2023)  Tobacco Use: High Risk (05/26/2024)   SDOH Interventions:     Readmission Risk Interventions    04/08/2023    2:51 PM 03/08/2023    4:05 PM 02/01/2023    1:33 PM  Readmission Risk Prevention Plan  Transportation Screening  Complete Complete  PCP or Specialist Appt within 3-5 Days  Complete   HRI or Home Care Consult  Complete   Social Work Consult for Recovery Care Planning/Counseling  Complete   Palliative Care Screening  Not Applicable   Medication Review Oceanographer)  Complete Complete  PCP or Specialist appointment within 3-5 days of discharge     HRI or Home Care Consult   Complete  SW Recovery Care/Counseling Consult   Complete  Palliative Care Screening   Not Applicable  Skilled Nursing Facility   Not Applicable     Information is confidential and restricted. Go to Review Flowsheets to unlock data.

## 2024-06-15 NOTE — Discharge Summary (Signed)
 Physician Discharge Summary   Patient: Kathleen [name] MRN: [id number] DOB: 1961/05/11  Admit date:     06/14/2024  Discharge date: 06/15/24  Discharge Physician: Delon Herald   PCP: Renne Homans, MD   Recommendations at discharge:   STOP smoking! Use prednisone  taper as directed Nebulizer machine ordered for home use Use Duonebs every 6 hours with albuterol  as needed every 2 hours for wheezing or shortness of breath Follow up with Dr. Amoako in 1-2 weeks  Discharge Diagnoses: Principal Problem:   COPD with acute exacerbation (HCC) Active Problems:   Tobacco abuse   Marijuana abuse   Cocaine abuse [facility])    Hospital Course: 63yo with COPD/asthma overlapy previously on 3L home O2 who presented on 7/3 with SOB.  Due to work of breathing and respiratory distress on presentation  she was started on BiPAP.  Given nebs, steroids, and magnesium .  Weaned back to [place] O2 and then to room air.    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 and thinks this led to her symptoms Came in with respiratory distress Treated with BIPAP -> [place] O2 Negative CXR, scant leukocytosis (likely demargination from steroids) Observed overnight She is currently ambulatory and very conversant without need for supplemental O2 Ambulatory pulse ox 97% on room air, does not qualify for home O2 Continues to wheeze but appears clinically stable for discharge with ongoing use of nebs and steroids Needs to stop smoking; counseling provided   Recent L metacarpal fracture Has been taking Percocet, obtaining from orthopedics Recommend stopping this medication, as she acknowledges using cocaine and is likely misusing opiates   GERD Continue PPI  Marijuana/Cocaine use d/o She  reports that she is planning to go to a treatment program in [place] on 7/15 Cessation encouraged  Continue hydroxyzine  as needed for anxiety, although this may not be needed if she is able to effectively stop using cocaine         Consultants: TOC team   Procedures: None   Antibiotics: None     Pain control - [place]  Controlled Substance Reporting System database was reviewed. and patient was instructed, not to drive, operate heavy machinery, perform activities at heights, swimming or participation in water  activities or provide baby-sitting services while on Pain, Sleep and Anxiety Medications; until their outpatient Physician has advised to do so again. Also recommended to not to take more than prescribed Pain, Sleep and Anxiety Medications.    Disposition: Home Diet recommendation:  Regular diet DISCHARGE MEDICATION: Allergies as of 06/15/2024       Reactions   Bee Venom Anaphylaxis   Peach [prunus Persica] Anaphylaxis, Swelling, Other (See Comments)   Throat swells , breaks out   Topamax [topiramate] Other (See Comments)   Hallucinations    Tramadol Nausea Only        Medication List     STOP taking these medications    EPINEPHrine  0.3 mg/0.3 mL Soaj injection Commonly known as: EPI-PEN   lidocaine  5 % Commonly known as: LIDODERM    oxyCODONE -acetaminophen  5-325 MG tablet Commonly known as: PERCOCET/ROXICET   pantoprazole  40 MG tablet Commonly known as: PROTONIX    Trelegy Ellipta  200-62.5-25 MCG/ACT Aepb Generic drug: Fluticasone -Umeclidin-Vilant       TAKE these medications    albuterol  (  2.5 MG/3ML) 0.083% nebulizer solution Commonly known as: PROVENTIL  Take 3 mLs (2.5 mg total) by nebulization every 2 (two) hours as needed for wheezing. What changed:  when to take this reasons to take this Another medication with the same name was removed. Continue taking this medication, and follow the directions you see here.   aspirin  EC 81 MG  tablet Take 81 mg by mouth daily. Swallow whole.   B-12 PO Take 1 tablet by mouth daily.   guaiFENesin -dextromethorphan  100-10 MG/5ML syrup Commonly known as: ROBITUSSIN DM Take 5 mLs by mouth every 4 (four) hours as needed for cough.   hydrOXYzine  25 MG tablet Commonly known as: ATARAX  Take 1 tablet (25 mg total) by mouth 3 (three) times daily as needed for anxiety. What changed: when to take this   ipratropium-albuterol  0.5-2.5 (3) MG/3ML Soln Commonly known as: DUONEB Take 3 mLs by nebulization every 6 (six) hours. What changed: See the new instructions.   loratadine  10 MG tablet Commonly known as: Claritin  Take 1 tablet (10 mg total) by mouth daily.   ondansetron  4 MG tablet Commonly known as: ZOFRAN  TAKE 1 TABLET BY MOUTH ONCE DAILY AS NEEDED FOR NAUSEA FOR VOMITING What changed: See the new instructions.   predniSONE  20 MG tablet Commonly known as: DELTASONE  Take 40 mg by mouth daily for 3 days, then 20mg  by mouth daily for 3 days, then 10mg  daily for 3 days               Durable Medical Equipment  (From admission, onward)           Start     Ordered   06/15/24 0000  For home use only DME Nebulizer machine       Question Answer Comment  Patient needs a nebulizer to treat with the following condition COPD with acute exacerbation (HCC)   Length of Need Lifetime   Additional equipment included Administration kit      06/15/24 1251            Discharge Exam:    Subjective: Feeling better but continues to wheeze.  Ambulatory without O2 without difficulty, getting ready to shower.   Objective: Vitals:   06/15/24 0937 06/15/24 1217  BP:    Pulse:    Resp:    Temp:    SpO2: 97% 99%    Intake/Output Summary (Last 24 hours) at 06/15/2024 1252 Last data filed at 06/14/2024 2000 Gross per 24 hour  Intake 860 ml  Output --  Net 860 ml   Filed Weights   06/14/24 0423  Weight: 58.5 kg    Exam:  General:  Appears calm and comfortable and  is in NAD, on RA Eyes:  normal lids, iris ENT:  grossly normal hearing, lips & tongue, mmm Cardiovascular:  RRR, no m/r/g. No LE edema.  Respiratory:   Diffuse wheezing with moderate air movement  Normal respiratory effort. Abdomen:  soft, NT, ND Skin:  no rash or induration seen on limited exam Musculoskeletal:  grossly normal tone BUE/BLE, good ROM, no bony abnormality Psychiatric:  grossly normal mood and affect, speech fluent and appropriate, AOx3 Neurologic:  CN 2-12 grossly intact, moves all extremities in coordinated fashion  Data Reviewed: I have reviewed the patient's lab results since admission.  Pertinent labs for today include:   Glucose 122 BUN 27/Creatinine 103/GFR >60 WBC 11.7 (steroids)     Condition at discharge: improving  The results of significant diagnostics from this hospitalization (including imaging, microbiology, ancillary and  laboratory) are listed below for reference.   Imaging Studies: DG Chest Port 1 View Result Date: 06/14/2024 CLINICAL DATA:  63 year old female with shortness of breath. EXAM: PORTABLE CHEST 1 VIEW COMPARISON:  Portable chest 05/26/2024 and earlier. FINDINGS: Portable AP semi upright view at 0423 hours. Larger lung volumes and mildly rotated to the left. Normal cardiac size and mediastinal contours. Visualized tracheal air column is within normal limits. Allowing for portable technique the lungs are clear. No pneumothorax or pleural effusion. No acute osseous abnormality identified. Negative visible bowel gas. IMPRESSION: No acute cardiopulmonary abnormality. Electronically Signed   By: VEAR Hurst M.D.   On: 06/14/2024 04:56   DG Chest 1 View Result Date: 05/26/2024 EXAM: 1 VIEW XRAY OF THE CHEST 05/26/2024 02:01:54 AM COMPARISON: 04/17/2024 CLINICAL HISTORY: Fall today. Recent left hand surgery. Pain and cough. FINDINGS: LUNGS AND PLEURA: No focal pulmonary opacity. No pulmonary edema. No pleural effusion. No pneumothorax. HEART AND  MEDIASTINUM: No acute abnormality of the cardiac and mediastinal silhouettes. BONES AND SOFT TISSUES: No acute osseous abnormality. IMPRESSION: 1. No acute process. Electronically signed by: Pinkie Pebbles MD 05/26/2024 02:09 AM EDT RP Workstation: [id number]   DG Hand Complete Left Result Date: 05/26/2024 EXAM: 3 VIEW(S) XRAY OF THE LEFT HAND 05/26/2024 02:01:54 AM COMPARISON: 05/02/2024 CLINICAL HISTORY: Fall today. Recent left hand surgery. Pain and cough. FINDINGS: BONES AND JOINTS: 3 screws transfixing an oblique fourth metacarpal shaft fracture with improved alignment. Fracture lucency remains visible. No acute fracture is seen. SOFT TISSUES: Overlying splint obscures fine osseous detail. Mild dorsal soft tissue swelling overlying the hand. IMPRESSION: 1. Status post ORIF of an oblique fourth metacarpal shaft fracture. Persistent fracture lucency. 2. No acute fracture. Electronically signed by: Pinkie Pebbles MD 05/26/2024 02:08 AM EDT RP Workstation: [id number]    Microbiology: Results for orders placed or performed during the hospital encounter of 06/14/24  Resp panel by RT-PCR (RSV, Flu A&B, Covid) Anterior Nasal Swab     Status: None   Collection Time: 06/14/24  4:37 AM   Specimen: Anterior Nasal Swab  Result Value Ref Range Status   SARS Coronavirus 2 by RT PCR NEGATIVE NEGATIVE Final    Comment: (NOTE) SARS-CoV-2 target nucleic acids are NOT DETECTED.  The SARS-CoV-2 RNA is generally detectable in upper respiratory specimens during the acute phase of infection. The lowest concentration of SARS-CoV-2 viral copies this assay can detect is 138 copies/mL. A negative result does not preclude SARS-Cov-2 infection and should not be used as the sole basis for treatment or other patient management decisions. A negative result may occur with  improper specimen collection/handling, submission of specimen other than nasopharyngeal swab, presence of viral mutation(s) within the areas  targeted by this assay, and inadequate number of viral copies(<138 copies/mL). A negative result must be combined with clinical observations, patient history, and epidemiological information. The expected result is Negative.  Fact Sheet for Patients:  BloggerCourse.com  Fact Sheet for Healthcare Providers:  SeriousBroker.it  This test is no t yet approved or cleared by the [place]  FDA and  has been authorized for detection and/or diagnosis of SARS-CoV-2 by FDA under an Emergency Use Authorization (EUA). This EUA will remain  in effect (meaning this test can be used) for the duration of the COVID-19 declaration under Section 564(b)(1) of the Act, 21 U.S.C.section 360bbb-3(b)(1), unless the authorization is terminated  or revoked sooner.       Influenza A by PCR NEGATIVE NEGATIVE Final   Influenza B by PCR NEGATIVE NEGATIVE  Final    Comment: (NOTE) The Xpert Xpress SARS-CoV-2/FLU/RSV plus assay is intended as an aid in the diagnosis of influenza from Nasopharyngeal swab specimens and should not be used as a sole basis for treatment. Nasal washings and aspirates are unacceptable for Xpert Xpress SARS-CoV-2/FLU/RSV testing.  Fact Sheet for Patients: BloggerCourse.com  Fact Sheet for Healthcare Providers: SeriousBroker.it  This test is not yet approved or cleared by the [place]  FDA and has been authorized for detection and/or diagnosis of SARS-CoV-2 by FDA under an Emergency Use Authorization (EUA). This EUA will remain in effect (meaning this test can be used) for the duration of the COVID-19 declaration under Section 564(b)(1) of the Act, 21 U.S.C. section 360bbb-3(b)(1), unless the authorization is terminated or revoked.     Resp Syncytial Virus by PCR NEGATIVE NEGATIVE Final    Comment: (NOTE) Fact Sheet for  Patients: BloggerCourse.com  Fact Sheet for Healthcare Providers: SeriousBroker.it  This test is not yet approved or cleared by the [place]  FDA and has been authorized for detection and/or diagnosis of SARS-CoV-2 by FDA under an Emergency Use Authorization (EUA). This EUA will remain in effect (meaning this test can be used) for the duration of the COVID-19 declaration under Section 564(b)(1) of the Act, 21 U.S.C. section 360bbb-3(b)(1), unless the authorization is terminated or revoked.  Performed at [facility], [address] [place], [place] [address]     Labs: CBC: Recent Labs  Lab 06/14/24 0433 06/15/24 0417  WBC 9.2 11.7*  HGB 14.5 12.7  HCT 44.9 39.4  MCV 85.4 85.7  PLT 317 277   Basic Metabolic Panel: Recent Labs  Lab 06/14/24 0433 06/15/24 0417  NA 136 136  K 4.2 4.0  CL 104 102  CO2 23 23  GLUCOSE 92 122*  BUN 22 27*  CREATININE 1.13* 1.03*  CALCIUM  9.1 8.7*   Liver Function Tests: No results for input(s): AST, ALT, ALKPHOS, BILITOT, PROT, ALBUMIN in the last 168 hours. CBG: No results for input(s): GLUCAP in the last 168 hours.  Discharge time spent: greater than 30 minutes.  Signed: Delon Herald, MD [facility] 06/15/2024

## 2024-06-18 ENCOUNTER — Telehealth: Payer: Self-pay

## 2024-06-18 NOTE — Transitions of Care (Post Inpatient/ED Visit) (Signed)
 06/18/2024  Name: Kathleen [name] MRN: [id number] DOB: 1961/03/16  Today's TOC FU Call Status: Today's TOC FU Call Status:: Successful TOC FU Call Completed TOC FU Call Complete Date: 06/18/24 Patient's Name and Date of Birth confirmed.  Transition Care Management Follow-up Telephone Call Date of Discharge: 06/15/24 Discharge Facility: [facility]) Type of Discharge: Inpatient Admission Primary Inpatient Discharge Diagnosis:: ARF How have you been since you were released from the hospital?: Better Any questions or concerns?: No  Items Reviewed: Did you receive and understand the discharge instructions provided?: Yes Medications obtained,verified, and reconciled?: Yes (Medications Reviewed) Any new allergies since your discharge?: No Dietary orders reviewed?: Yes Do you have support at home?: Yes People in Home [RPT]: child(ren), adult  Medications Reviewed Today: Medications Reviewed Today     Reviewed by Emmitt Pan, LPN (Licensed Practical Nurse) on 06/18/24 at 1135  Med List Status: <None>   Medication Order Taking? Sig Documenting Provider Last Dose Status Informant  albuterol  (PROVENTIL ) (2.5 MG/3ML) 0.083% nebulizer solution [id number] Yes Take 3 mLs (2.5 mg total) by nebulization every 2 (two) hours as needed for wheezing. Barbarann Nest, MD  Active   aspirin  EC 81 MG tablet [id number] Yes Take 81 mg by mouth daily. Swallow whole. [provider]  Active Self, Pharmacy Records  Cyanocobalamin (B-12 PO) [id number] Yes Take 1 tablet by mouth daily. [provider]  Active Self, Pharmacy Records  guaiFENesin -dextromethorphan  (ROBITUSSIN DM) 100-10 MG/5ML syrup [id number] Yes Take 5 mLs by mouth every 4 (four) hours as needed for cough. Barbarann Nest, MD  Active   hydrOXYzine  (ATARAX ) 25 MG tablet [id number] Yes Take 1 tablet (25 mg total) by mouth 3 (three) times daily as needed for anxiety.  Patient taking differently: Take 25 mg by mouth daily as  needed for anxiety.   Nooruddin, Saad, MD  Active Self, Pharmacy Records  ipratropium-albuterol  (DUONEB) 0.5-2.5 (3) MG/3ML SOLN [id number] Yes Take 3 mLs by nebulization every 6 (six) hours. Barbarann Nest, MD  Active   loratadine  (CLARITIN ) 10 MG tablet [id number] Yes Take 1 tablet (10 mg total) by mouth daily. Samtani, Jai-Gurmukh, MD  Active Self, Pharmacy Records  ondansetron  (ZOFRAN ) 4 MG tablet [id number] Yes TAKE 1 TABLET BY MOUTH ONCE DAILY AS NEEDED FOR NAUSEA FOR VOMITING  Patient taking differently: Take 4 mg by mouth daily as needed for vomiting or nausea.   Amoako, Prince, MD  Active Self, Pharmacy Records  predniSONE  (DELTASONE ) 20 MG tablet [id number] Yes Take 40 mg by mouth daily for 3 days, then 20mg  by mouth daily for 3 days, then 10mg  daily for 3 days Barbarann Nest, MD  Active   Med List Note Dino Overcast, CPhT 12/24/20 1550): Patient number # [phone]            Home Care and Equipment/Supplies: Were Home Health Services Ordered?: NA Any new equipment or medical supplies ordered?: NA  Functional Questionnaire: Do you need assistance with bathing/showering or dressing?: No Do you need assistance with meal preparation?: No Do you need assistance with eating?: No Do you have difficulty maintaining continence: No Do you need assistance with getting out of bed/getting out of a chair/moving?: No Do you have difficulty managing or taking your medications?: No  Follow up appointments reviewed: PCP Follow-up appointment confirmed?: No (patient will call back to schedule) Specialist Hospital Follow-up appointment confirmed?: NA Do you need transportation to your follow-up appointment?: No Do you understand care options if your condition(s) worsen?: Yes-patient verbalized understanding  SIGNATURE Julian Lemmings, LPN [facility] Nurse Health Advisor Direct Dial [phone]

## 2024-06-25 ENCOUNTER — Telehealth: Payer: Self-pay | Admitting: Student

## 2024-06-25 NOTE — Telephone Encounter (Unsigned)
 Copied from CRM [id number]. Topic: Clinical - Medication Refill >> Jun 25, 2024  5:02 PM Vivian Z wrote: Medication: albuterol  (VENTOLIN  HFA) 108 (90 Base) MCG/ACT inhaler 2 puff  Has the patient contacted their pharmacy? Yes (Agent: If no, request that the patient contact the pharmacy for the refill. If patient does not wish to contact the pharmacy document the reason why and proceed with request.) (Agent: If yes, when and what did the pharmacy advise?)  This is the patient's preferred pharmacy:  [facility] [address] [place] - [address] [place] [address] Phone: [phone] Fax: [phone]  Is this the correct pharmacy for this prescription? Yes If no, delete pharmacy and type the correct one.   Has the prescription been filled recently? No  Is the patient out of the medication? Yes  Has the patient been seen for an appointment in the last year OR does the patient have an upcoming appointment? Yes  Can we respond through MyChart? No  Agent: Please be advised that Rx refills may take up to 3 business days. We ask that you follow-up with your pharmacy.

## 2024-06-26 NOTE — Telephone Encounter (Signed)
 Patient requesting refill for the following not shown on active medication list:  Medication: albuterol  (VENTOLIN  HFA) 108 (90 Base) MCG/ACT inhaler 2 puff   Has the patient contacted their pharmacy? Yes (Agent: If no, request that the patient contact the pharmacy for the refill. If patient does not wish to contact the pharmacy document the reason why and proceed with request.) (Agent: If yes, when and what did the pharmacy advise?)   This is the patient's preferred pharmacy:  [facility] [address], [place] - [address] [place] [address] Phone: [phone] Fax: [phone]

## 2024-06-27 ENCOUNTER — Telehealth: Payer: Self-pay | Admitting: *Deleted

## 2024-06-27 NOTE — Telephone Encounter (Signed)
 Copied from CRM [id number]. Topic: Medical Record Request - Provider/Facility Request >> Jun 27, 2024 12:22 PM Susanna ORN wrote: Reason for CRM: Ludivina Curly, Health Supervisor for [facility], called stating that the patient was admitted yesterday & she came in with a bunch of expired medications. Ludivina stated that the patient told her [facility]. Ludivina is asking if someone can email her the patient's medication list. Her email is [email or web address]. Her callback number is [phone].

## 2024-07-02 NOTE — Telephone Encounter (Signed)
 I contacted pt back about medication list, no answer. Unable to lvm , vm is not setup. I also contacted the number below, but no answer.

## 2024-07-18 ENCOUNTER — Other Ambulatory Visit: Payer: Self-pay | Admitting: Student

## 2024-07-18 NOTE — Telephone Encounter (Signed)
 Called the pt and provided the following information to resch her Mammogram appt  [facility] diagnostic imaging center [address]  [address] [place] [address]  [phone]   Copied from CRM [id number]. Topic: Referral - Question >> Jul 18, 2024  2:10 PM Suzette B wrote: Reason for CRM: Ms. Traci Gafford called from [phone], she stated that she had gotten a referral to have a mammogram done; however, when the appt came up she was in rehabilitation, patient states she needs to see about rescheduling the mammogram and wasn't sure if she needed a another referral or should she just call to get another appt. With imaging. Please call patient at the number listed above.

## 2024-07-20 ENCOUNTER — Inpatient Hospital Stay

## 2024-07-20 ENCOUNTER — Encounter: Payer: Self-pay | Admitting: Student

## 2024-08-03 ENCOUNTER — Ambulatory Visit

## 2024-08-18 ENCOUNTER — Encounter (HOSPITAL_COMMUNITY): Payer: Self-pay

## 2024-08-18 ENCOUNTER — Inpatient Hospital Stay (HOSPITAL_COMMUNITY)
Admission: EM | Admit: 2024-08-18 | Discharge: 2024-08-22 | DRG: 189 | Disposition: A | Discharge status: Home / Self Care | Attending: Family Medicine | Admitting: Family Medicine

## 2024-08-18 ENCOUNTER — Emergency Department (HOSPITAL_COMMUNITY)

## 2024-08-18 DIAGNOSIS — G8929 Other chronic pain: Secondary | ICD-10-CM | POA: Diagnosis present

## 2024-08-18 DIAGNOSIS — Z803 Family history of malignant neoplasm of breast: Secondary | ICD-10-CM

## 2024-08-18 DIAGNOSIS — Z7982 Long term (current) use of aspirin: Secondary | ICD-10-CM

## 2024-08-18 DIAGNOSIS — Z8249 Family history of ischemic heart disease and other diseases of the circulatory system: Secondary | ICD-10-CM | POA: Diagnosis not present

## 2024-08-18 DIAGNOSIS — N950 Postmenopausal bleeding: Secondary | ICD-10-CM | POA: Diagnosis present

## 2024-08-18 DIAGNOSIS — Z885 Allergy status to narcotic agent status: Secondary | ICD-10-CM | POA: Diagnosis not present

## 2024-08-18 DIAGNOSIS — J439 Emphysema, unspecified: Secondary | ICD-10-CM | POA: Diagnosis present

## 2024-08-18 DIAGNOSIS — Z888 Allergy status to other drugs, medicaments and biological substances status: Secondary | ICD-10-CM

## 2024-08-18 DIAGNOSIS — Z91018 Allergy to other foods: Secondary | ICD-10-CM | POA: Diagnosis not present

## 2024-08-18 DIAGNOSIS — F419 Anxiety disorder, unspecified: Secondary | ICD-10-CM | POA: Diagnosis present

## 2024-08-18 DIAGNOSIS — E785 Hyperlipidemia, unspecified: Secondary | ICD-10-CM | POA: Diagnosis present

## 2024-08-18 DIAGNOSIS — Z1152 Encounter for screening for COVID-19: Secondary | ICD-10-CM

## 2024-08-18 DIAGNOSIS — Z91030 Bee allergy status: Secondary | ICD-10-CM | POA: Diagnosis not present

## 2024-08-18 DIAGNOSIS — G4733 Obstructive sleep apnea (adult) (pediatric): Secondary | ICD-10-CM | POA: Diagnosis present

## 2024-08-18 DIAGNOSIS — J441 Chronic obstructive pulmonary disease with (acute) exacerbation: Secondary | ICD-10-CM | POA: Diagnosis present

## 2024-08-18 DIAGNOSIS — K219 Gastro-esophageal reflux disease without esophagitis: Secondary | ICD-10-CM | POA: Diagnosis present

## 2024-08-18 DIAGNOSIS — F319 Bipolar disorder, unspecified: Secondary | ICD-10-CM | POA: Diagnosis present

## 2024-08-18 DIAGNOSIS — Z800 Family history of malignant neoplasm of digestive organs: Secondary | ICD-10-CM

## 2024-08-18 DIAGNOSIS — R0603 Acute respiratory distress: Secondary | ICD-10-CM | POA: Diagnosis present

## 2024-08-18 DIAGNOSIS — J9600 Acute respiratory failure, unspecified whether with hypoxia or hypercapnia: Secondary | ICD-10-CM | POA: Diagnosis present

## 2024-08-18 DIAGNOSIS — F1410 Cocaine abuse, uncomplicated: Secondary | ICD-10-CM | POA: Diagnosis present

## 2024-08-18 DIAGNOSIS — J45901 Unspecified asthma with (acute) exacerbation: Secondary | ICD-10-CM | POA: Diagnosis present

## 2024-08-18 DIAGNOSIS — Z91199 Patient's noncompliance with other medical treatment and regimen due to unspecified reason: Secondary | ICD-10-CM

## 2024-08-18 DIAGNOSIS — Z841 Family history of disorders of kidney and ureter: Secondary | ICD-10-CM

## 2024-08-18 DIAGNOSIS — Z833 Family history of diabetes mellitus: Secondary | ICD-10-CM | POA: Diagnosis not present

## 2024-08-18 DIAGNOSIS — J4489 Other specified chronic obstructive pulmonary disease: Secondary | ICD-10-CM | POA: Diagnosis present

## 2024-08-18 DIAGNOSIS — F17210 Nicotine dependence, cigarettes, uncomplicated: Secondary | ICD-10-CM | POA: Diagnosis present

## 2024-08-18 DIAGNOSIS — Z8673 Personal history of transient ischemic attack (TIA), and cerebral infarction without residual deficits: Secondary | ICD-10-CM | POA: Diagnosis not present

## 2024-08-18 DIAGNOSIS — S62309A Unspecified fracture of unspecified metacarpal bone, initial encounter for closed fracture: Secondary | ICD-10-CM

## 2024-08-18 DIAGNOSIS — R300 Dysuria: Secondary | ICD-10-CM | POA: Diagnosis present

## 2024-08-18 LAB — CBC WITH DIFFERENTIAL/PLATELET
Abs Immature Granulocytes: 0.01 K/uL (ref 0.00–0.07)
Basophils Absolute: 0.1 K/uL (ref 0.0–0.1)
Basophils Relative: 1 %
Eosinophils Absolute: 0.8 K/uL — ABNORMAL HIGH (ref 0.0–0.5)
Eosinophils Relative: 10 %
HCT: 42 % (ref 36.0–46.0)
Hemoglobin: 12.9 g/dL (ref 12.0–15.0)
Immature Granulocytes: 0 %
Lymphocytes Relative: 37 %
Lymphs Abs: 3.1 K/uL (ref 0.7–4.0)
MCH: 27.2 pg (ref 26.0–34.0)
MCHC: 30.7 g/dL (ref 30.0–36.0)
MCV: 88.4 fL (ref 80.0–100.0)
Monocytes Absolute: 0.8 K/uL (ref 0.1–1.0)
Monocytes Relative: 10 %
Neutro Abs: 3.5 K/uL (ref 1.7–7.7)
Neutrophils Relative %: 42 %
Platelets: 306 K/uL (ref 150–400)
RBC: 4.75 MIL/uL (ref 3.87–5.11)
RDW: 13.9 % (ref 11.5–15.5)
WBC: 8.2 K/uL (ref 4.0–10.5)
nRBC: 0 % (ref 0.0–0.2)

## 2024-08-18 LAB — URINALYSIS, ROUTINE W REFLEX MICROSCOPIC
Bilirubin Urine: NEGATIVE
Glucose, UA: NEGATIVE mg/dL
Ketones, ur: NEGATIVE mg/dL
Nitrite: NEGATIVE
Protein, ur: NEGATIVE mg/dL
Specific Gravity, Urine: 1.015 (ref 1.005–1.030)
pH: 6 (ref 5.0–8.0)

## 2024-08-18 LAB — URINE DRUG SCREEN
Amphetamines: NEGATIVE
Barbiturates: NEGATIVE
Benzodiazepines: NEGATIVE
Cocaine: POSITIVE — AB
Fentanyl: NEGATIVE
Methadone Scn, Ur: NEGATIVE
Opiates: NEGATIVE
Tetrahydrocannabinol: NEGATIVE

## 2024-08-18 LAB — BASIC METABOLIC PANEL WITH GFR
Anion gap: 11 (ref 5–15)
BUN: 9 mg/dL (ref 8–23)
CO2: 25 mmol/L (ref 22–32)
Calcium: 9.1 mg/dL (ref 8.9–10.3)
Chloride: 107 mmol/L (ref 98–111)
Creatinine, Ser: 0.97 mg/dL (ref 0.44–1.00)
GFR, Estimated: >60 mL/min
Glucose, Bld: 92 mg/dL (ref 70–99)
Potassium: 5.5 mmol/L — ABNORMAL HIGH (ref 3.5–5.1)
Sodium: 142 mmol/L (ref 135–145)

## 2024-08-18 LAB — MRSA NEXT GEN BY PCR, NASAL: MRSA by PCR Next Gen: NOT DETECTED

## 2024-08-18 LAB — RESP PANEL BY RT-PCR (RSV, FLU A&B, COVID)  RVPGX2
Influenza A by PCR: NEGATIVE
Influenza B by PCR: NEGATIVE
Resp Syncytial Virus by PCR: NEGATIVE
SARS Coronavirus 2 by RT PCR: NEGATIVE

## 2024-08-18 LAB — MAGNESIUM: Magnesium: 2.7 mg/dL — ABNORMAL HIGH (ref 1.7–2.4)

## 2024-08-18 LAB — TROPONIN T, HIGH SENSITIVITY
Troponin T High Sensitivity: <15 ng/L (ref 0–19)
Troponin T High Sensitivity: <15 ng/L (ref 0–19)

## 2024-08-18 LAB — POTASSIUM: Potassium: 3.8 mmol/L (ref 3.5–5.1)

## 2024-08-18 MED ORDER — MELATONIN 5 MG PO TABS
5.0000 mg | ORAL_TABLET | Freq: Once | ORAL | Status: AC
  Administered 2024-08-18: 5 mg via ORAL
  Filled 2024-08-18: qty 1

## 2024-08-18 MED ORDER — SODIUM CHLORIDE 0.9 % IV BOLUS
500.0000 mL | Freq: Once | INTRAVENOUS | Status: AC
  Administered 2024-08-18: 500 mL via INTRAVENOUS

## 2024-08-18 MED ORDER — ALBUTEROL SULFATE (2.5 MG/3ML) 0.083% IN NEBU
2.5000 mg | INHALATION_SOLUTION | Freq: Once | RESPIRATORY_TRACT | Status: AC
  Administered 2024-08-18: 2.5 mg via RESPIRATORY_TRACT
  Filled 2024-08-18: qty 3

## 2024-08-18 MED ORDER — ALBUTEROL SULFATE (2.5 MG/3ML) 0.083% IN NEBU
2.5000 mg | INHALATION_SOLUTION | RESPIRATORY_TRACT | Status: DC | PRN
Start: 2024-08-18 — End: 2024-08-22

## 2024-08-18 MED ORDER — ACETAMINOPHEN 650 MG RE SUPP: 650.0000 mg | Freq: Four times a day (QID) | RECTAL | Status: DC | PRN

## 2024-08-18 MED ORDER — ONDANSETRON HCL 4 MG/2ML IJ SOLN
4.0000 mg | Freq: Four times a day (QID) | INTRAMUSCULAR | Status: DC | PRN
  Administered 2024-08-19: 4 mg via INTRAVENOUS
  Filled 2024-08-18 (×2): qty 2

## 2024-08-18 MED ORDER — METHYLPREDNISOLONE SODIUM SUCC 125 MG IJ SOLR
125.0000 mg | Freq: Once | INTRAMUSCULAR | Status: AC
  Administered 2024-08-18: 125 mg via INTRAVENOUS
  Filled 2024-08-18: qty 2

## 2024-08-18 MED ORDER — LIDOCAINE 5 % EX PTCH
1.0000 | MEDICATED_PATCH | CUTANEOUS | Status: DC
  Administered 2024-08-18 – 2024-08-21 (×4): 1 via TRANSDERMAL
  Filled 2024-08-18 (×4): qty 1

## 2024-08-18 MED ORDER — IPRATROPIUM-ALBUTEROL 0.5-2.5 (3) MG/3ML IN SOLN
6.0000 mL | Freq: Once | RESPIRATORY_TRACT | Status: AC
  Administered 2024-08-18: 6 mL via RESPIRATORY_TRACT
  Filled 2024-08-18: qty 6

## 2024-08-18 MED ORDER — MAGNESIUM SULFATE 2 GM/50ML IV SOLN
2.0000 g | Freq: Once | INTRAVENOUS | Status: AC
  Administered 2024-08-18: 2 g via INTRAVENOUS
  Filled 2024-08-18: qty 50

## 2024-08-18 MED ORDER — CHLORHEXIDINE GLUCONATE CLOTH 2 % EX PADS
6.0000 | MEDICATED_PAD | Freq: Every day | CUTANEOUS | Status: DC
  Administered 2024-08-18 – 2024-08-20 (×2): 6 via TOPICAL

## 2024-08-18 MED ORDER — NICOTINE 7 MG/24HR TD PT24
7.0000 mg | MEDICATED_PATCH | Freq: Every day | TRANSDERMAL | Status: DC
  Administered 2024-08-18 – 2024-08-21 (×4): 7 mg via TRANSDERMAL
  Filled 2024-08-18 (×5): qty 1

## 2024-08-18 MED ORDER — ORAL CARE MOUTH RINSE
15.0000 mL | OROMUCOSAL | Status: DC | PRN
Start: 2024-08-18 — End: 2024-08-22

## 2024-08-18 MED ORDER — ACETAMINOPHEN 325 MG PO TABS
650.0000 mg | ORAL_TABLET | Freq: Four times a day (QID) | ORAL | Status: DC | PRN
  Administered 2024-08-18 – 2024-08-21 (×2): 650 mg via ORAL
  Filled 2024-08-18 (×3): qty 2

## 2024-08-18 MED ORDER — ORAL CARE MOUTH RINSE
15.0000 mL | OROMUCOSAL | Status: DC
  Administered 2024-08-18 – 2024-08-20 (×5): 15 mL via OROMUCOSAL

## 2024-08-18 MED ORDER — OXYCODONE-ACETAMINOPHEN 5-325 MG PO TABS
1.0000 | ORAL_TABLET | Freq: Four times a day (QID) | ORAL | Status: DC | PRN
  Administered 2024-08-18 – 2024-08-22 (×8): 1 via ORAL
  Filled 2024-08-18 (×9): qty 1

## 2024-08-18 MED ORDER — IPRATROPIUM-ALBUTEROL 0.5-2.5 (3) MG/3ML IN SOLN
3.0000 mL | Freq: Four times a day (QID) | RESPIRATORY_TRACT | Status: DC
  Administered 2024-08-18 – 2024-08-22 (×15): 3 mL via RESPIRATORY_TRACT
  Filled 2024-08-18 (×15): qty 3

## 2024-08-18 MED ORDER — ONDANSETRON HCL 4 MG PO TABS
4.0000 mg | ORAL_TABLET | Freq: Four times a day (QID) | ORAL | Status: DC | PRN
  Administered 2024-08-20: 4 mg via ORAL
  Filled 2024-08-18: qty 1

## 2024-08-18 MED ORDER — ENOXAPARIN SODIUM 40 MG/0.4ML IJ SOSY
40.0000 mg | PREFILLED_SYRINGE | INTRAMUSCULAR | Status: DC
  Administered 2024-08-18 – 2024-08-21 (×4): 40 mg via SUBCUTANEOUS
  Filled 2024-08-18 (×4): qty 0.4

## 2024-08-18 NOTE — Assessment & Plan Note (Signed)
 She is unsure what medication she is on for this F/u once med rec complete

## 2024-08-18 NOTE — ED Triage Notes (Signed)
 Patient here from home reporting sob, hx copd and asthma.

## 2024-08-18 NOTE — Assessment & Plan Note (Signed)
 Check UA

## 2024-08-18 NOTE — Progress Notes (Signed)
   08/18/24 1318  BiPAP/CPAP/SIPAP  $ Non-Invasive Ventilator  Non-Invasive Vent Set Up;Non-Invasive Vent Initial  $ Face Mask Small Yes  BiPAP/CPAP/SIPAP Pt Type Adult  BiPAP/CPAP/SIPAP (S)  V60  Mask Type Full face mask  Dentures removed? No (pt declined removal of dentures. RT encouraged and went over safety reasons of removal, pt declined removal.)  Mask Size Small  Set Rate (S)  16 breaths/min  Respiratory Rate 34 breaths/min  IPAP (S)  10 cmH20  EPAP (S)  5 cmH2O  FiO2 (%) (S)  40 %  Flow Rate 0.9 lpm  Minute Ventilation 14.4  Leak 0  Peak Inspiratory Pressure (PIP) 11  Tidal Volume (Vt) 416  Patient Home Machine No  Patient Home Mask No  Patient Home Tubing No  Auto Titrate No  Press High Alarm 35 cmH2O  Press Low Alarm 5 cmH2O  Nasal massage performed No (comment)  CPAP/SIPAP surface wiped down Yes  Device Plugged into RED Power Outlet Yes  BiPAP/CPAP /SiPAP Vitals  Pulse Rate 82  Resp (!) 35  BP (!) 169/99  SpO2 100 %  Bilateral Breath Sounds Diminished  MEWS Score/Color  MEWS Score 2  MEWS Score Color Yellow

## 2024-08-18 NOTE — ED Notes (Signed)
 Urine sent to the lab already @ 1641 pm

## 2024-08-18 NOTE — Assessment & Plan Note (Signed)
 Not on cpap at night

## 2024-08-18 NOTE — Assessment & Plan Note (Signed)
 Lidocaine patch

## 2024-08-18 NOTE — ED Notes (Signed)
 Unsuccessful IV attempt x2.

## 2024-08-18 NOTE — Assessment & Plan Note (Addendum)
 Went to rehab in August in [place] since July per patient; however, her UDS is positive for cocaine.

## 2024-08-18 NOTE — ED Provider Notes (Signed)
 [place] EMERGENCY DEPARTMENT AT [facility] Provider Note   CSN: [id number] Arrival date & time: 08/18/24  1248     Patient presents with: Shortness of Breath   Kathleen [name] is a 63 y.o. female.   17 female with history of COPD and asthma presents for evaluation of shortness of breath.  On arrival she is wheezing with increased work of breathing and very tearful.  History limited due to patient's acuity.  She does state she has had a cough as well as some chest tightness but no fevers.  Admits cough is productive.  States her inhalers at home has not been helping.   Shortness of Breath Associated symptoms: cough   Associated symptoms: no abdominal pain, no chest pain, no ear pain, no fever, no rash, no sore throat and no vomiting        Prior to Admission medications   Medication Sig Start Date End Date Taking? Authorizing Provider  albuterol  (PROVENTIL ) (2.5 MG/3ML) 0.083% nebulizer solution Take 3 mLs (2.5 mg total) by nebulization every 2 (two) hours as needed for wheezing. 06/15/24   Barbarann Nest, MD  aspirin  EC 81 MG tablet Take 81 mg by mouth daily. Swallow whole.    [provider]  Cyanocobalamin (B-12 PO) Take 1 tablet by mouth daily.    [provider]  guaiFENesin -dextromethorphan  (ROBITUSSIN DM) 100-10 MG/5ML syrup Take 5 mLs by mouth every 4 (four) hours as needed for cough. 06/15/24   Barbarann Nest, MD  hydrOXYzine  (ATARAX ) 25 MG tablet TAKE 1 TABLET BY MOUTH THREE TIMES DAILY AS NEEDED FOR ANXIETY 07/18/24   Renne Homans, MD  ipratropium-albuterol  (DUONEB) 0.5-2.5 (3) MG/3ML SOLN Take 3 mLs by nebulization every 6 (six) hours. 06/15/24   Barbarann Nest, MD  loratadine  (CLARITIN ) 10 MG tablet Take 1 tablet (10 mg total) by mouth daily. 02/01/23   Samtani, Jai-Gurmukh, MD  ondansetron  (ZOFRAN ) 4 MG tablet TAKE 1 TABLET BY MOUTH ONCE DAILY AS NEEDED FOR NAUSEA FOR VOMITING Patient taking differently: Take 4 mg by mouth daily as needed for  vomiting or nausea. 06/07/24   Amoako, Prince, MD  predniSONE  (DELTASONE ) 20 MG tablet Take 40 mg by mouth daily for 3 days, then 20mg  by mouth daily for 3 days, then 10mg  daily for 3 days 06/15/24   Barbarann Nest, MD    Allergies: Bee venom, Peach [prunus persica], Topamax [topiramate], and Tramadol    Review of Systems  Constitutional:  Negative for chills and fever.  HENT:  Negative for ear pain and sore throat.   Eyes:  Negative for pain and visual disturbance.  Respiratory:  Positive for cough and shortness of breath.   Cardiovascular:  Negative for chest pain and palpitations.  Gastrointestinal:  Negative for abdominal pain and vomiting.  Genitourinary:  Negative for dysuria and hematuria.  Musculoskeletal:  Negative for arthralgias and back pain.  Skin:  Negative for color change and rash.  Neurological:  Negative for seizures and syncope.  All other systems reviewed and are negative.   Updated Vital Signs BP (!) 124/95   Pulse 71   Temp 98.2 F (36.8 C) (Oral)   Resp (!) 21   LMP 11/08/2014   SpO2 97%   Physical Exam Vitals and nursing note reviewed.  Constitutional:      General: She is in acute distress.     Appearance: She is well-developed. She is ill-appearing.  HENT:     Head: Normocephalic and atraumatic.  Eyes:  Conjunctiva/sclera: Conjunctivae normal.  Cardiovascular:     Rate and Rhythm: Regular rhythm. Tachycardia present.     Heart sounds: No murmur heard. Pulmonary:     Effort: Respiratory distress present.     Breath sounds: Decreased breath sounds and wheezing present.     Comments: Increased work of breathing, tachypnea Abdominal:     Palpations: Abdomen is soft.     Tenderness: There is no abdominal tenderness.  Musculoskeletal:        General: No swelling.     Cervical back: Neck supple.  Skin:    General: Skin is warm and dry.     Capillary Refill: Capillary refill takes less than 2 seconds.  Neurological:     Mental Status: She is  alert.  Psychiatric:        Mood and Affect: Mood normal.     (all labs ordered are listed, but only abnormal results are displayed) Labs Reviewed  BASIC METABOLIC PANEL WITH GFR - Abnormal; Notable for the following components:      Result Value   Potassium 5.5 (*)    All other components within normal limits  CBC WITH DIFFERENTIAL/PLATELET - Abnormal; Notable for the following components:   Eosinophils Absolute 0.8 (*)    All other components within normal limits  RESP PANEL BY RT-PCR (RSV, FLU A&B, COVID)  [id number]  CBC WITH DIFFERENTIAL/PLATELET  URINE DRUG SCREEN  TROPONIN T, HIGH SENSITIVITY  TROPONIN T, HIGH SENSITIVITY    EKG: None  Radiology: DG Chest 1 View Result Date: 08/18/2024 CLINICAL DATA:  Shortness of breath. EXAM: CHEST  1 VIEW COMPARISON:  June 14, 2024. FINDINGS: The heart size and mediastinal contours are within normal limits. Both lungs are clear. The visualized skeletal structures are unremarkable. IMPRESSION: No active disease. Electronically Signed   By: Lynwood Landy Raddle M.D.   On: 08/18/2024 13:27     Procedures   Medications Ordered in the ED  albuterol  (PROVENTIL ) (2.5 MG/3ML) 0.083% nebulizer solution 2.5 mg (2.5 mg Nebulization Given 08/18/24 1318)  ipratropium-albuterol  (DUONEB) 0.5-2.5 (3) MG/3ML nebulizer solution 6 mL (6 mLs Nebulization Given 08/18/24 1319)  methylPREDNISolone  sodium succinate (SOLU-MEDROL ) 125 mg/2 mL injection 125 mg (125 mg Intravenous Given 08/18/24 1454)  magnesium  sulfate IVPB 2 g 50 mL (0 g Intravenous Stopped 08/18/24 1532)  sodium chloride  0.9 % bolus 500 mL (0 mLs Intravenous Stopped 08/18/24 1600)    Clinical Course as of 08/18/24 1711  Sat Aug 18, 2024  1657 EKG not crossing over into MUSE.  Interpreted me in the absence of cardiology shows sinus rhythm, rate 89, nonspecific interventricular conduction delay, there is some artifact in the baseline, no STEMI [MK]    Clinical Course User Index [MK] Gennaro Duwaine CROME, DO                                  Medical Decision Making Cardiac monitor interpretation: Sinus tachycardia to sinus rhythm, no ectopy  Social determinants of health: Patient with history of drug abuse  Patient here for asthma exacerbation/COPD exacerbation.  On arrival she is tachycardic with appropriate oxygen  saturation but has increased work of breathing and wheezing as well as diminished breath sounds.  Was given hour-long neb, magnesium  steroids and placed on BiPAP.  On reevaluation she is somewhat improved and tachycardia has improved.  Remainder of workup largely negative.  Discussed patient's case with hospitalist patient admitted for further workup and management.  She is agreeable to plan.    Problems Addressed: Acute respiratory failure, unspecified whether with hypoxia or hypercapnia (HCC): acute illness or injury that poses a threat to life or bodily functions COPD exacerbation (HCC): acute illness or injury that poses a threat to life or bodily functions Exacerbation of asthma, unspecified asthma severity, unspecified whether persistent: acute illness or injury that poses a threat to life or bodily functions  Amount and/or Complexity of Data Reviewed External Data Reviewed: notes.    Details: Prior ED records reviewed patient has a history of drug abuse and has been seen frequently for COPD/asthma exacerbation Labs: ordered. Decision-making details documented in ED Course.    Details: Ordered and reviewed by me and shows no acute abnormality, CBC BMP troponin within normal limits Radiology: ordered and independent interpretation performed. Decision-making details documented in ED Course.    Details: Ordered and interpreted by me independently of radiology Chest x-ray: Shows no acute abnormality in the chest ECG/medicine tests: ordered and independent interpretation performed. Decision-making details documented in ED Course.    Details: Ordered and not crossing over 90s.   Interpreted by me in the absence of cardiology and shows sinus rhythm, nonspecific interventricular conduction delay, no STEMI Discussion of management or test interpretation with external provider(s): Dr. Aneta spoke with her regarding patient's case and patiently admitted for further workup and management.  Risk OTC drugs. Prescription drug management. Drug therapy requiring intensive monitoring for toxicity. Decision regarding hospitalization. Diagnosis or treatment significantly limited by social determinants of health. Risk Details: CRITICAL CARE Performed by: Duwaine LITTIE Fusi   Total critical care time: 35 minutes  Critical care time was exclusive of separately billable procedures and treating other patients.  Critical care was necessary to treat or prevent imminent or life-threatening deterioration.  Critical care was time spent personally by me on the following activities: development of treatment plan with patient and/or surrogate as well as nursing, discussions with consultants, evaluation of patient's response to treatment, examination of patient, obtaining history from patient or surrogate, ordering and performing treatments and interventions, ordering and review of laboratory studies, ordering and review of radiographic studies, pulse oximetry and re-evaluation of patient's condition.      Final diagnoses:  Acute respiratory failure, unspecified whether with hypoxia or hypercapnia (HCC)  COPD exacerbation (HCC)  Exacerbation of asthma, unspecified asthma severity, unspecified whether persistent    ED Discharge Orders     None          Fusi Duwaine LITTIE, DO 08/18/24 1711

## 2024-08-18 NOTE — Assessment & Plan Note (Addendum)
 Followed by Dr. Shari Recently given oxycodone  on 08/15/24  Would avoid long term in setting of cocaine use

## 2024-08-18 NOTE — H&P (Signed)
 History and Physical    Patient: Kathleen [name] FMW:[id number] DOB: 07/04/1961 DOA: 08/18/2024 DOS: the patient was seen and examined on 08/18/2024 PCP: Renne Homans, MD  Patient coming from: Home - lives alone. Ambulates independently.    Chief Complaint: short of breath/respiratory distress   HPI: ARLEY GARANT is a 63 y.o. female with medical history significant of asthma and COPD previously on home oxygen  (over 1 year ago), bipolar 1 d/o, chronic pain, GERD, depression, HLD, OSA not on cpap, polysubstance abuse who presented to ED with complaints of respiratory distress. She has been more short of breath from baseline for about 5 days. She had been using her rescue inhaler with some success but then went to work today and used some cleaner. She was at work and sprayed some cleaner on the floor and thinks the chemicals started her to have worsening shortness of breath. She laid down on the sofa and felt light headed and short of breath so came to hospital. She was unable to talk due to her breathing. She has not had increased coughing or increased sputum production. No fever/chills. She has had a few post tussive emesis episodes. She also complaints of dysuria.   Last admitted for COPD exacerbation in July of this year. Did not qualify for home oxygen  at that time.   Denies any fever/chills, vision changes/headaches, chest pain or palpitations, abdominal pain, N/V/D, or  leg swelling.    She smokes 2 cigs/day. No alcohol. Denies any drug use.   ER Course:  vitals: afebrile, bp: 144/119, HR: 102, RR: 26, oxygen : 95% Pertinent labs: potassium: 5.5 (hemolyzed)  CXR: no active disease In ED: given 500cc IVF bolus, steroids, magnesium , duoneb and albuterol . Placed on bipap, [facility] asked to admit.    Review of Systems: As mentioned in the history of present illness. All other systems reviewed and are negative. Past Medical History:  Diagnosis Date   Acute respiratory failure with hypoxia and  hypercapnia (HCC) 03/08/2023   Anxiety    Arthritis    hands   Asthma    2 sets of PFT's in 04/09 without sign variability. Last set with significant decrease in FEV1 with saline alone, suggesting Asthma but  recommended clinical corelation    Asthma    Asthma exacerbation 04/07/2023   Bipolar 1 disorder [facility])    therapist is Joy and is followed by [facility]   Blackout    negative work up including ESR, ANA, opthalmology referral, carotid dopplers, 2D echo, MRI and EEG.   BREAST LUMP 03/25/2008   Annotation: bilaterally Qualifier: Diagnosis of  By: Rogerio MD, Starlyn BIRCH.    Breast mass in female    s/p mammogram, u/s and biopsy in 05/09 c/w fibroadenoma,   Bronchitis    Chronic headache    Chronic low back pain 08/08/2008   Qualifier: Diagnosis of  By: Tanda  MD, Valerie     Chronic pain    normal work up including TSH, RPR, B12, HIV, plain films, 2 ESR's, ANA, CK, RF along with routine CBC, CMET and UA. Further work up includes CRP, ESR, SPEP/UPEP, hepatitis erology, A1C , repeat ANA   COPD (chronic obstructive pulmonary disease) (HCC)    COPD exacerbation (HCC) 03/31/2019   Depression    DUB (dysfunctional uterine bleeding)    and pelvic pain, with negative endometrial bx in 07/09 and transvaginal U/S significant for mild fibroids in 0/09.   Emphysema of lung (HCC)    GERD (gastroesophageal reflux disease)  Hallucinations 03/18/2008   Qualifier: Diagnosis of  By: Tanda  MD, [place]     Hyperlipidemia    Lower extremity edema    Neg ABI's, normal 2D echo, normal albumin   Menorrhagia    Ovarian cyst    Polysubstance abuse (HCC)    none since March 17,2009.   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 She started smoking about 37 years ago. She has a 7.5 pack-year smoking history. She has never used smokeless tobacco. She reports current alcohol use. She reports current drug use. Drug: Marijuana.  Allergies  Allergen Reactions   Bee Venom Anaphylaxis   Peach [Prunus Persica] Anaphylaxis, Swelling and Other (See Comments)    Throat swells , breaks out   Topamax [Topiramate] Other (See Comments)    Hallucinations    Tramadol Nausea Only    Family History  Problem Relation Age of Onset   Colon cancer Mother 57   Breast cancer Mother 74   Rectal cancer Mother    Diabetes Father    Hypertension Father    Kidney disease Father    Colon cancer Father 27   Prostate cancer Father    Cancer Father    Kidney disease Sister    Cancer Brother    Breast cancer Maternal Grandmother 103   Esophageal cancer Neg Hx    Stomach cancer Neg Hx     Prior to Admission medications   Medication Sig Start Date End Date Taking? Authorizing Provider  albuterol  (PROVENTIL ) (2.5 MG/3ML) 0.083% nebulizer solution Take 3 mLs (2.5 mg total) by nebulization every 2 (two) hours as needed for wheezing. 06/15/24   Barbarann Nest, MD  aspirin  EC 81 MG tablet Take 81 mg by mouth daily. Swallow whole.    [provider]  Cyanocobalamin (B-12 PO) Take 1 tablet by mouth daily.    [provider]  guaiFENesin -dextromethorphan  (ROBITUSSIN DM) 100-10 MG/5ML syrup Take 5 mLs by mouth every 4 (four) hours as needed for cough. 06/15/24   Barbarann Nest, MD  hydrOXYzine  (ATARAX ) 25 MG tablet TAKE 1 TABLET BY MOUTH THREE TIMES  DAILY AS NEEDED FOR ANXIETY 07/18/24   Renne Homans, MD  ipratropium-albuterol  (DUONEB) 0.5-2.5 (3) MG/3ML SOLN Take 3 mLs by nebulization every 6 (six) hours. 06/15/24   Barbarann Nest, MD  loratadine  (CLARITIN ) 10 MG tablet Take 1 tablet (10 mg total) by mouth daily. 02/01/23   Samtani, Jai-Gurmukh, MD  ondansetron  (ZOFRAN ) 4 MG tablet TAKE 1 TABLET BY MOUTH ONCE DAILY AS NEEDED FOR NAUSEA FOR VOMITING Patient taking differently: Take 4 mg by mouth daily as needed for vomiting or nausea. 06/07/24   Amoako, Prince, MD  predniSONE  (DELTASONE ) 20 MG tablet Take 40 mg by  mouth daily for 3 days, then 20mg  by mouth daily for 3 days, then 10mg  daily for 3 days 06/15/24   Barbarann Nest, MD    Physical Exam: Vitals:   08/18/24 1627 08/18/24 1736 08/18/24 1813 08/18/24 2000  BP:  (!) 132/100  (!) 144/89  Pulse: 71 87    Resp: (!) 21 (!) 22    Temp:  97.9 F (36.6 C)    TempSrc:  Oral    SpO2: 97% 98%    Weight:   60.9 kg   Height:   5' 3 (1.6 m)    General:  Appears calm and comfortable and is in NAD. Dyspneic, but having full conversations.  Eyes:  PERRL, EOMI, normal lids, iris ENT:  grossly normal hearing, lips & tongue, mmm; appropriate dentition Neck:  no LAD, masses or thyromegaly; no carotid bruits Cardiovascular:  RRR, no m/r/g. No LE edema.  Respiratory:   occasional expiratory wheeze. Tachypnea and dyspnea, but taking in full sentences  Abdomen:  soft, NT, ND, NABS Back:   normal alignment, no CVAT Skin:  no rash or induration seen on limited exam Musculoskeletal:  grossly normal tone BUE/BLE, good ROM, no bony abnormality Lower extremity:  No LE edema.  Limited foot exam with no ulcerations.  2+ distal pulses. Psychiatric:  grossly normal mood and affect, speech fluent and appropriate, AOx3 Neurologic:  CN 2-12 grossly intact, moves all extremities in coordinated fashion, sensation intact   Radiological Exams on Admission: Independently reviewed - see discussion in A/P where  applicable  DG Chest 1 View Result Date: 08/18/2024 CLINICAL DATA:  Shortness of breath. EXAM: CHEST  1 VIEW COMPARISON:  June 14, 2024. FINDINGS: The heart size and mediastinal contours are within normal limits. Both lungs are clear. The visualized skeletal structures are unremarkable. IMPRESSION: No active disease. Electronically Signed   By: Lynwood Landy Raddle M.D.   On: 08/18/2024 13:27    EKG: Independently reviewed.  NSR with rate 80; nonspecific ST changes with no evidence of acute ischemia. LAFB, borderline prolonged qt    Labs on Admission: I have personally reviewed the available labs and imaging studies at the time of the admission.  Pertinent labs:     Assessment and Plan: Principal Problem:   Respiratory distress secondary to COPD exacerbation Active Problems:   Cocaine abuse (HCC)   Dysuria   left Metacarpal bone fracture   Bipolar 1 disorder (HCC)   Chronic back pain   OSA (obstructive sleep apnea)   Asthma-COPD overlap syndrome (HCC)    Assessment and Plan: * Respiratory distress secondary to COPD exacerbation 63 year old presenting to ED in respiratory distress requiring bipap due to COPD exacerbation  -admit to stepdown  -hx of COPD-asthma overlap syndrome -no hx of increased cough, viscous production. No abx indicated -likely due to cocaine +cleaner  -check vbg  -continue IV steroids -schedule duonebs -PRN SABA  -encouraged smoking and cocaine cessation  -wean to oxygen  [place] as tolerated   Cocaine abuse (HCC) Went to rehab in August in [place] since July per patient; however, her UDS is positive for cocaine.   Dysuria Check UA   left Metacarpal bone fracture Followed by Dr. Shari Recently given oxycodone  on 08/15/24  Would avoid long term in setting of cocaine use   Bipolar 1 disorder (HCC) She is unsure what medication she is on for this F/u once med rec complete   Chronic back pain Lidocaine  patch   OSA (obstructive sleep apnea) Not  on  cpap at night     Advance Care Planning:   Code Status: Full Code   Consults: none   DVT Prophylaxis: lovenox    Family Communication: none   Severity of Illness: The appropriate patient status for this patient is INPATIENT. Inpatient status is judged to be reasonable and necessary in order to provide the required intensity of service to ensure the patient's safety. The patient's presenting symptoms, physical exam findings, and initial radiographic and laboratory data in the context of their chronic comorbidities is felt to place them at high risk for further clinical deterioration. Furthermore, it is not anticipated that the patient will be medically stable for discharge from the hospital within 2 midnights of admission.   * I certify that at the point of admission it is my clinical judgment that the patient will require inpatient hospital care spanning beyond 2 midnights from the point of admission due to high intensity of service, high risk for further deterioration and high frequency of surveillance required.*  Author: Isaiah Geralds, MD 08/18/2024 8:36 PM  For on call review [email or web address].

## 2024-08-18 NOTE — Assessment & Plan Note (Addendum)
 63 year old presenting to ED in respiratory distress requiring bipap due to COPD exacerbation  -admit to stepdown  -hx of COPD-asthma overlap syndrome -no hx of increased cough, viscous production. No abx indicated -likely due to cocaine +cleaner  -check vbg  -continue IV steroids -schedule duonebs -PRN SABA  -encouraged smoking and cocaine cessation  -wean to oxygen  [place] as tolerated

## 2024-08-19 ENCOUNTER — Other Ambulatory Visit: Payer: Self-pay

## 2024-08-19 DIAGNOSIS — R0603 Acute respiratory distress: Secondary | ICD-10-CM | POA: Diagnosis not present

## 2024-08-19 LAB — BASIC METABOLIC PANEL WITH GFR
Anion gap: 12 (ref 5–15)
BUN: 14 mg/dL (ref 8–23)
CO2: 25 mmol/L (ref 22–32)
Calcium: 9.2 mg/dL (ref 8.9–10.3)
Chloride: 102 mmol/L (ref 98–111)
Creatinine, Ser: 0.98 mg/dL (ref 0.44–1.00)
GFR, Estimated: >60 mL/min (ref >60)
Glucose, Bld: 133 mg/dL — ABNORMAL HIGH (ref 70–99)
Potassium: 4.5 mmol/L (ref 3.5–5.1)
Sodium: 139 mmol/L (ref 135–145)

## 2024-08-19 LAB — BLOOD GAS, VENOUS
Acid-Base Excess: 3.7 mmol/L — ABNORMAL HIGH (ref 0.0–2.0)
Bicarbonate: 30.5 mmol/L — ABNORMAL HIGH (ref 20.0–28.0)
O2 Saturation: 53.5 %
Patient temperature: 36.1
pCO2, Ven: 52 mmHg (ref 44–60)
pH, Ven: 7.37 (ref 7.25–7.43)
pO2, Ven: 31 mmHg — CL (ref 32–45)

## 2024-08-19 LAB — CBC
HCT: 41.3 % (ref 36.0–46.0)
Hemoglobin: 12.7 g/dL (ref 12.0–15.0)
MCH: 27.2 pg (ref 26.0–34.0)
MCHC: 30.8 g/dL (ref 30.0–36.0)
MCV: 88.4 fL (ref 80.0–100.0)
Platelets: 306 K/uL (ref 150–400)
RBC: 4.67 MIL/uL (ref 3.87–5.11)
RDW: 13.7 % (ref 11.5–15.5)
WBC: 7.8 K/uL (ref 4.0–10.5)
nRBC: 0 % (ref 0.0–0.2)

## 2024-08-19 MED ORDER — MELATONIN 5 MG PO TABS
5.0000 mg | ORAL_TABLET | Freq: Every evening | ORAL | Status: DC | PRN
  Administered 2024-08-19 – 2024-08-21 (×3): 5 mg via ORAL
  Filled 2024-08-19 (×3): qty 1

## 2024-08-19 MED ORDER — LORATADINE 10 MG PO TABS
10.0000 mg | ORAL_TABLET | Freq: Every day | ORAL | Status: DC
Start: 2024-08-19 — End: 2024-08-22
  Administered 2024-08-19 – 2024-08-22 (×4): 10 mg via ORAL
  Filled 2024-08-19 (×4): qty 1

## 2024-08-19 MED ORDER — ASPIRIN 81 MG PO TBEC
81.0000 mg | DELAYED_RELEASE_TABLET | Freq: Every day | ORAL | Status: DC
  Administered 2024-08-19 – 2024-08-22 (×4): 81 mg via ORAL
  Filled 2024-08-19 (×4): qty 1

## 2024-08-19 MED ORDER — HYDROXYZINE HCL 10 MG PO TABS
10.0000 mg | ORAL_TABLET | Freq: Once | ORAL | Status: DC | PRN
  Filled 2024-08-19: qty 1

## 2024-08-19 MED ORDER — DICLOFENAC SODIUM 1 % EX GEL
2.0000 g | Freq: Four times a day (QID) | CUTANEOUS | Status: DC
  Administered 2024-08-19 – 2024-08-22 (×3): 2 g via TOPICAL
  Filled 2024-08-19 (×2): qty 100

## 2024-08-19 MED ORDER — GUAIFENESIN-DM 100-10 MG/5ML PO SYRP
5.0000 mL | ORAL_SOLUTION | ORAL | Status: DC | PRN
  Administered 2024-08-20 (×2): 5 mL via ORAL
  Filled 2024-08-19 (×2): qty 10

## 2024-08-19 NOTE — Progress Notes (Signed)
 PROGRESS NOTE    ALANEE TING  FMW:[id number] DOB: [birth year] DOA: 08/18/2024 PCP: Renne Homans, MD    Brief Narrative:  Kathleen [name] is a 63 y.o. female with medical history significant of asthma and COPD previously on home oxygen  (over 1 year ago), bipolar 1 d/o, chronic pain, GERD, depression, HLD, OSA not on cpap, polysubstance abuse who presented to ED with complaints of respiratory distress. She has been more short of breath from baseline for about 5 days. She had been using her rescue inhaler with some success but then went to work today and used some cleaner. She was at work and sprayed some cleaner on the floor and thinks the chemicals started her to have worsening shortness of breath. She laid down on the sofa and felt light headed and short of breath so came to hospital. She was unable to talk due to her breathing. She has not had increased coughing or increased sputum production. No fever/chills. She has had a few post tussive emesis episodes. She also complaints of dysuria.   Assessment and Plan: * Respiratory distress secondary to COPD exacerbation 64 year old presenting to ED in respiratory distress requiring bipap due to COPD exacerbation  -admit to stepdown  -hx of COPD-asthma overlap syndrome -likely due to cocaine +cleaner  -continue IV steroids -schedule duonebs -PRN SABA  -encouraged smoking and cocaine cessation  -wean to oxygen  [place] as tolerated -Home O2 eval prior to discharge  Cocaine abuse (HCC) Went to rehab in August in [place] since July per patient; however, her UDS is positive for cocaine.   left Metacarpal bone fracture Followed by Dr. Shari Recently given oxycodone  on 08/15/24  Would avoid long term in setting of cocaine use   Bipolar 1 disorder (HCC) -Does not appear to be on medications for this  Chronic back pain Lidocaine  patch   OSA (obstructive sleep apnea) Not on cpap at night           DVT prophylaxis: enoxaparin   (LOVENOX ) injection 40 mg Start: 08/18/24 2200    Code Status: Full Code  Disposition Plan:  Level of care: Stepdown Status is: Inpatient     Consultants:  None   Subjective: Still complaining of shortness of breath  Objective: Vitals:   08/19/24 0500 08/19/24 0600 08/19/24 0852 08/19/24 0900  BP: (!) 147/95 132/84  (!) 133/94  Pulse: 70 70  (!) 103  Resp: 19 17  (!) 21  Temp:   97.9 F (36.6 C)   TempSrc:   Oral   SpO2: 99% 98%  (!) 89%  Weight:      Height:        Intake/Output Summary (Last 24 hours) at 08/19/2024 1122 Last data filed at 08/18/2024 1640 Gross per 24 hour  Intake 790 ml  Output --  Net 790 ml   Filed Weights   08/18/24 1813  Weight: 60.9 kg    Examination:   General: Appearance:  Chronically ill appearing female in no acute distress     Lungs:   Wheezing throughout, respirations unlabored, on nasal cannula  Heart:    Tachycardic.    MS:   All extremities are intact.    Neurologic:   Awake, alert, oriented x 3. No apparent focal neurological           defect.        Data Reviewed: I have personally reviewed following labs and imaging studies  CBC: Recent Labs  Lab 08/18/24 1500 08/19/24 0242  WBC  8.2 7.8  NEUTROABS 3.5  --   HGB 12.9 12.7  HCT 42.0 41.3  MCV 88.4 88.4  PLT 306 306   Basic Metabolic Panel: Recent Labs  Lab 08/18/24 1431 08/18/24 1855 08/19/24 0242  NA 142  --  139  K 5.5* 3.8 4.5  CL 107  --  102  CO2 25  --  25  GLUCOSE 92  --  133*  BUN 9  --  14  CREATININE 0.97  --  0.98  CALCIUM  9.1  --  9.2  MG  --  2.7*  --    GFR: Estimated Creatinine Clearance: 48.6 mL/min (by C-G formula based on SCr of 0.98 mg/dL). Liver Function Tests: No results for input(s): AST, ALT, ALKPHOS, BILITOT, PROT, ALBUMIN in the last 168 hours. No results for input(s): LIPASE, AMYLASE in the last 168 hours. No results for input(s): AMMONIA in the last 168 hours. Coagulation Profile: No results for  input(s): INR, PROTIME in the last 168 hours. Cardiac Enzymes: No results for input(s): CKTOTAL, CKMB, CKMBINDEX, TROPONINI in the last 168 hours. BNP (last 3 results) No results for input(s): PROBNP in the last 8760 hours. HbA1C: No results for input(s): HGBA1C in the last 72 hours. CBG: No results for input(s): GLUCAP in the last 168 hours. Lipid Profile: No results for input(s): CHOL, HDL, LDLCALC, TRIG, CHOLHDL, LDLDIRECT in the last 72 hours. Thyroid  Function Tests: No results for input(s): TSH, T4TOTAL, FREET4, T3FREE, THYROIDAB in the last 72 hours. Anemia Panel: No results for input(s): VITAMINB12, FOLATE, FERRITIN, TIBC, IRON, RETICCTPCT in the last 72 hours. Sepsis Labs: No results for input(s): PROCALCITON, LATICACIDVEN in the last 168 hours.  Recent Results (from the past 240 hours)  Resp panel by RT-PCR (RSV, Flu A&B, Covid) Anterior Nasal Swab     Status: None   Collection Time: 08/18/24  1:17 PM   Specimen: Anterior Nasal Swab  Result Value Ref Range Status   SARS Coronavirus 2 by RT PCR NEGATIVE NEGATIVE Final    Comment: (NOTE) SARS-CoV-2 target nucleic acids are NOT DETECTED.  The SARS-CoV-2 RNA is generally detectable in upper respiratory specimens during the acute phase of infection. The lowest concentration of SARS-CoV-2 viral copies this assay can detect is 138 copies/mL. A negative result does not preclude SARS-Cov-2 infection and should not be used as the sole basis for treatment or other patient management decisions. A negative result may occur with  improper specimen collection/handling, submission of specimen other than nasopharyngeal swab, presence of viral mutation(s) within the areas targeted by this assay, and inadequate number of viral copies(<138 copies/mL). A negative result must be combined with clinical observations, patient history, and epidemiological information. The expected result is  Negative.  Fact Sheet for Patients:  BloggerCourse.com  Fact Sheet for Healthcare Providers:  SeriousBroker.it  This test is no t yet approved or cleared by the [place]  FDA and  has been authorized for detection and/or diagnosis of SARS-CoV-2 by FDA under an Emergency Use Authorization (EUA). This EUA will remain  in effect (meaning this test can be used) for the duration of the COVID-19 declaration under Section 564(b)(1) of the Act, 21 U.S.C.section 360bbb-3(b)(1), unless the authorization is terminated  or revoked sooner.       Influenza A by PCR NEGATIVE NEGATIVE Final   Influenza B by PCR NEGATIVE NEGATIVE Final    Comment: (NOTE) The Xpert Xpress SARS-CoV-2/FLU/RSV plus assay is intended as an aid in the diagnosis of influenza from Nasopharyngeal swab specimens  and should not be used as a sole basis for treatment. Nasal washings and aspirates are unacceptable for Xpert Xpress SARS-CoV-2/FLU/RSV testing.  Fact Sheet for Patients: BloggerCourse.com  Fact Sheet for Healthcare Providers: SeriousBroker.it  This test is not yet approved or cleared by the [place]  FDA and has been authorized for detection and/or diagnosis of SARS-CoV-2 by FDA under an Emergency Use Authorization (EUA). This EUA will remain in effect (meaning this test can be used) for the duration of the COVID-19 declaration under Section 564(b)(1) of the Act, 21 U.S.C. section 360bbb-3(b)(1), unless the authorization is terminated or revoked.     Resp Syncytial Virus by PCR NEGATIVE NEGATIVE Final    Comment: (NOTE) Fact Sheet for Patients: BloggerCourse.com  Fact Sheet for Healthcare Providers: SeriousBroker.it  This test is not yet approved or cleared by the [place]  FDA and has been authorized for detection and/or diagnosis of  SARS-CoV-2 by FDA under an Emergency Use Authorization (EUA). This EUA will remain in effect (meaning this test can be used) for the duration of the COVID-19 declaration under Section 564(b)(1) of the Act, 21 U.S.C. section 360bbb-3(b)(1), unless the authorization is terminated or revoked.  Performed at [facility], [address], [place], [place] [address]   MRSA Next Gen by PCR, Nasal     Status: None   Collection Time: 08/18/24  6:21 PM   Specimen: Nasal Mucosa; Nasal Swab  Result Value Ref Range Status   MRSA by PCR Next Gen NOT DETECTED NOT DETECTED Final    Comment: (NOTE) The GeneXpert MRSA Assay (FDA approved for NASAL specimens only), is one component of a comprehensive MRSA colonization surveillance program. It is not intended to diagnose MRSA infection nor to guide or monitor treatment for MRSA infections. Test performance is not FDA approved in patients less than 57 years old. Performed at [facility], [address], [place], [place] [address]          Radiology Studies: DG Chest 1 View Result Date: 08/18/2024 CLINICAL DATA:  Shortness of breath. EXAM: CHEST  1 VIEW COMPARISON:  June 14, 2024. FINDINGS: The heart size and mediastinal contours are within normal limits. Both lungs are clear. The visualized skeletal structures are unremarkable. IMPRESSION: No active disease. Electronically Signed   By: Lynwood Landy Raddle M.D.   On: 08/18/2024 13:27        Scheduled Meds:  Chlorhexidine  Gluconate Cloth  6 each Topical Daily   enoxaparin  (LOVENOX ) injection  40 mg Subcutaneous Q24H   ipratropium-albuterol   3 mL Nebulization Q6H   lidocaine   1 patch Transdermal Q24H   nicotine   7 mg Transdermal Daily   mouth rinse  15 mL Mouth Rinse 4 times per day   Continuous Infusions:   LOS: 1 day    Time spent: 45 minutes spent on chart review, discussion with nursing staff, consultants, updating family and interview/physical exam;  more than 50% of that time was spent in counseling and/or coordination of care.    Harlene RAYMOND Bowl, DO [facility] Available via Epic secure chat 7am-7pm After these hours, please refer to coverage provider listed on [email or web address] 08/19/2024, 11:22 AM

## 2024-08-20 DIAGNOSIS — R0603 Acute respiratory distress: Secondary | ICD-10-CM | POA: Diagnosis not present

## 2024-08-20 LAB — BASIC METABOLIC PANEL WITH GFR
Anion gap: 9 (ref 5–15)
BUN: 21 mg/dL (ref 8–23)
CO2: 26 mmol/L (ref 22–32)
Calcium: 8.8 mg/dL — ABNORMAL LOW (ref 8.9–10.3)
Chloride: 107 mmol/L (ref 98–111)
Creatinine, Ser: 0.97 mg/dL (ref 0.44–1.00)
GFR, Estimated: >60 mL/min (ref >60)
Glucose, Bld: 91 mg/dL (ref 70–99)
Potassium: 4.3 mmol/L (ref 3.5–5.1)
Sodium: 141 mmol/L (ref 135–145)

## 2024-08-20 LAB — URINE CULTURE

## 2024-08-20 LAB — CBC
HCT: 40.9 % (ref 36.0–46.0)
Hemoglobin: 12.3 g/dL (ref 12.0–15.0)
MCH: 27.3 pg (ref 26.0–34.0)
MCHC: 30.1 g/dL (ref 30.0–36.0)
MCV: 90.7 fL (ref 80.0–100.0)
Platelets: 271 K/uL (ref 150–400)
RBC: 4.51 MIL/uL (ref 3.87–5.11)
RDW: 13.9 % (ref 11.5–15.5)
WBC: 10.1 K/uL (ref 4.0–10.5)
nRBC: 0 % (ref 0.0–0.2)

## 2024-08-20 MED ORDER — POLYETHYLENE GLYCOL 3350 17 G PO PACK
17.0000 g | PACK | Freq: Every day | ORAL | Status: DC
  Administered 2024-08-20: 17 g via ORAL
  Filled 2024-08-20 (×2): qty 1

## 2024-08-20 MED ORDER — METHYLPREDNISOLONE SODIUM SUCC 40 MG IJ SOLR
40.0000 mg | Freq: Two times a day (BID) | INTRAMUSCULAR | Status: DC
  Administered 2024-08-20 – 2024-08-22 (×5): 40 mg via INTRAVENOUS
  Filled 2024-08-20 (×5): qty 1

## 2024-08-20 NOTE — Progress Notes (Signed)
 PROGRESS NOTE    Kathleen [name]  FMW:[id number] DOB: 10-08-1961 DOA: 08/18/2024 PCP: Renne Homans, MD    Brief Narrative:  Kathleen [name] is a 63 y.o. female with medical history significant of asthma and COPD previously on home oxygen  (over 1 year ago), bipolar 1 d/o, chronic pain, GERD, depression, HLD, OSA not on cpap, polysubstance abuse who presented to ED with complaints of respiratory distress. She has been more short of breath from baseline for about 5 days. She had been using her rescue inhaler with some success but then went to work today and used some cleaner. She was at work and sprayed some cleaner on the floor and thinks the chemicals started her to have worsening shortness of breath. She laid down on the sofa and felt light headed and short of breath so came to hospital. She was unable to talk due to her breathing. She has not had increased coughing or increased sputum production. No fever/chills. She has had a few post tussive emesis episodes. She also complaints of dysuria.    Assessment and Plan: * Respiratory distress secondary to COPD exacerbation 63 year old presenting to ED in respiratory distress requiring bipap due to COPD exacerbation  -hx of COPD-asthma overlap syndrome -likely due to cocaine +cleaner  -resume IV steroids -schedule duonebs -PRN SABA  -encouraged smoking and cocaine cessation  -wean to oxygen  [place] as tolerated -Home O2 eval prior to discharge  Cocaine abuse (HCC) Went to rehab in August in [place] since July per patient; however, her UDS is positive for cocaine.   left Metacarpal bone fracture Followed by Dr. Shari Recently given oxycodone  on 08/15/24  Would avoid long term in setting of cocaine use   Bipolar 1 disorder (HCC) -Does not appear to be on medications for this  Chronic back pain Lidocaine  patch   OSA (obstructive sleep apnea) Not on cpap at night           DVT prophylaxis: enoxaparin  (LOVENOX ) injection 40 mg  Start: 08/18/24 2200    Code Status: Full Code  Disposition Plan:  Level of care: Stepdown Status is: Inpatient     Consultants:  None   Subjective: + cough and SOB  Objective: Vitals:   08/20/24 0400 08/20/24 0801 08/20/24 1040 08/20/24 1121  BP: 117/76  130/80   Pulse: 70     Resp: 16     Temp:  98.2 F (36.8 C)  98.2 F (36.8 C)  TempSrc:  Oral  Oral  SpO2: 100% 99%    Weight:      Height:        Intake/Output Summary (Last 24 hours) at 08/20/2024 1208 Last data filed at 08/20/2024 9062 Gross per 24 hour  Intake 240 ml  Output --  Net 240 ml   Filed Weights   08/18/24 1813  Weight: 60.9 kg    Examination:   General: Appearance:  Chronically ill appearing female in no acute distress     Lungs:   Wheezing throughout,  on nasal cannula  Heart:    Normal heart rate.    MS:   All extremities are intact.    Neurologic:   Awake, alert, oriented x 3. No apparent focal neurological           defect.        Data Reviewed: I have personally reviewed following labs and imaging studies  CBC: Recent Labs  Lab 08/18/24 1500 08/19/24 0242 08/20/24 0245  WBC 8.2 7.8 10.1  NEUTROABS 3.5  --   --   HGB 12.9 12.7 12.3  HCT 42.0 41.3 40.9  MCV 88.4 88.4 90.7  PLT 306 306 271   Basic Metabolic Panel: Recent Labs  Lab 08/18/24 1431 08/18/24 1855 08/19/24 0242 08/20/24 0245  NA 142  --  139 141  K 5.5* 3.8 4.5 4.3  CL 107  --  102 107  CO2 25  --  25 26  GLUCOSE 92  --  133* 91  BUN 9  --  14 21  CREATININE 0.97  --  0.98 0.97  CALCIUM  9.1  --  9.2 8.8*  MG  --  2.7*  --   --    GFR: Estimated Creatinine Clearance: 49.1 mL/min (by C-G formula based on SCr of 0.97 mg/dL). Liver Function Tests: No results for input(s): AST, ALT, ALKPHOS, BILITOT, PROT, ALBUMIN in the last 168 hours. No results for input(s): LIPASE, AMYLASE in the last 168 hours. No results for input(s): AMMONIA in the last 168 hours. Coagulation Profile: No  results for input(s): INR, PROTIME in the last 168 hours. Cardiac Enzymes: No results for input(s): CKTOTAL, CKMB, CKMBINDEX, TROPONINI in the last 168 hours. BNP (last 3 results) No results for input(s): PROBNP in the last 8760 hours. HbA1C: No results for input(s): HGBA1C in the last 72 hours. CBG: No results for input(s): GLUCAP in the last 168 hours. Lipid Profile: No results for input(s): CHOL, HDL, LDLCALC, TRIG, CHOLHDL, LDLDIRECT in the last 72 hours. Thyroid  Function Tests: No results for input(s): TSH, T4TOTAL, FREET4, T3FREE, THYROIDAB in the last 72 hours. Anemia Panel: No results for input(s): VITAMINB12, FOLATE, FERRITIN, TIBC, IRON, RETICCTPCT in the last 72 hours. Sepsis Labs: No results for input(s): PROCALCITON, LATICACIDVEN in the last 168 hours.  Recent Results (from the past 240 hours)  Resp panel by RT-PCR (RSV, Flu A&B, Covid) Anterior Nasal Swab     Status: None   Collection Time: 08/18/24  1:17 PM   Specimen: Anterior Nasal Swab  Result Value Ref Range Status   SARS Coronavirus 2 by RT PCR NEGATIVE NEGATIVE Final    Comment: (NOTE) SARS-CoV-2 target nucleic acids are NOT DETECTED.  The SARS-CoV-2 RNA is generally detectable in upper respiratory specimens during the acute phase of infection. The lowest concentration of SARS-CoV-2 viral copies this assay can detect is 138 copies/mL. A negative result does not preclude SARS-Cov-2 infection and should not be used as the sole basis for treatment or other patient management decisions. A negative result may occur with  improper specimen collection/handling, submission of specimen other than nasopharyngeal swab, presence of viral mutation(s) within the areas targeted by this assay, and inadequate number of viral copies(<138 copies/mL). A negative result must be combined with clinical observations, patient history, and epidemiological information. The  expected result is Negative.  Fact Sheet for Patients:  BloggerCourse.com  Fact Sheet for Healthcare Providers:  SeriousBroker.it  This test is no t yet approved or cleared by the [place]  FDA and  has been authorized for detection and/or diagnosis of SARS-CoV-2 by FDA under an Emergency Use Authorization (EUA). This EUA will remain  in effect (meaning this test can be used) for the duration of the COVID-19 declaration under Section 564(b)(1) of the Act, 21 U.S.C.section 360bbb-3(b)(1), unless the authorization is terminated  or revoked sooner.       Influenza A by PCR NEGATIVE NEGATIVE Final   Influenza B by PCR NEGATIVE NEGATIVE Final    Comment: (NOTE) The Xpert Xpress  SARS-CoV-2/FLU/RSV plus assay is intended as an aid in the diagnosis of influenza from Nasopharyngeal swab specimens and should not be used as a sole basis for treatment. Nasal washings and aspirates are unacceptable for Xpert Xpress SARS-CoV-2/FLU/RSV testing.  Fact Sheet for Patients: BloggerCourse.com  Fact Sheet for Healthcare Providers: SeriousBroker.it  This test is not yet approved or cleared by the [place]  FDA and has been authorized for detection and/or diagnosis of SARS-CoV-2 by FDA under an Emergency Use Authorization (EUA). This EUA will remain in effect (meaning this test can be used) for the duration of the COVID-19 declaration under Section 564(b)(1) of the Act, 21 U.S.C. section 360bbb-3(b)(1), unless the authorization is terminated or revoked.     Resp Syncytial Virus by PCR NEGATIVE NEGATIVE Final    Comment: (NOTE) Fact Sheet for Patients: BloggerCourse.com  Fact Sheet for Healthcare Providers: SeriousBroker.it  This test is not yet approved or cleared by the [place]  FDA and has been authorized for detection and/or  diagnosis of SARS-CoV-2 by FDA under an Emergency Use Authorization (EUA). This EUA will remain in effect (meaning this test can be used) for the duration of the COVID-19 declaration under Section 564(b)(1) of the Act, 21 U.S.C. section 360bbb-3(b)(1), unless the authorization is terminated or revoked.  Performed at [facility], [address], [place], [place] [address]   MRSA Next Gen by PCR, Nasal     Status: None   Collection Time: 08/18/24  6:21 PM   Specimen: Nasal Mucosa; Nasal Swab  Result Value Ref Range Status   MRSA by PCR Next Gen NOT DETECTED NOT DETECTED Final    Comment: (NOTE) The GeneXpert MRSA Assay (FDA approved for NASAL specimens only), is one component of a comprehensive MRSA colonization surveillance program. It is not intended to diagnose MRSA infection nor to guide or monitor treatment for MRSA infections. Test performance is not FDA approved in patients less than 44 years old. Performed at [facility], [address], [place], [place] [address]   Urine Culture (for pregnant, neutropenic or urologic patients or patients with an indwelling urinary catheter)     Status: Abnormal   Collection Time: 08/18/24  8:31 PM   Specimen: Urine, Clean Catch  Result Value Ref Range Status   Specimen Description   Final    URINE, CLEAN CATCH Performed at [facility] [place], [address], [place], [place] [address]    Special Requests   Final    NONE Performed at [facility], [address], [place], [place] [address]    Culture MULTIPLE SPECIES PRESENT, SUGGEST RECOLLECTION (A)  Final   Report Status 08/20/2024 FINAL  Final         Radiology Studies: DG Chest 1 View Result Date: 08/18/2024 CLINICAL DATA:  Shortness of breath. EXAM: CHEST  1 VIEW COMPARISON:  June 14, 2024. FINDINGS: The heart size and mediastinal contours are within normal limits. Both lungs are clear. The visualized  skeletal structures are unremarkable. IMPRESSION: No active disease. Electronically Signed   By: Lynwood Landy Raddle M.D.   On: 08/18/2024 13:27        Scheduled Meds:  aspirin  EC  81 mg Oral Daily   Chlorhexidine  Gluconate Cloth  6 each Topical Daily   diclofenac  Sodium  2 g Topical QID   enoxaparin  (LOVENOX ) injection  40 mg Subcutaneous Q24H   ipratropium-albuterol   3 mL Nebulization Q6H   lidocaine   1 patch Transdermal Q24H   loratadine   10 mg Oral Daily   methylPREDNISolone  (SOLU-MEDROL ) injection  40 mg Intravenous Q12H   nicotine   7 mg Transdermal Daily   mouth rinse  15 mL Mouth Rinse 4 times per day   Continuous Infusions:   LOS: 2 days    Time spent: 45 minutes spent on chart review, discussion with nursing staff, consultants, updating family and interview/physical exam; more than 50% of that time was spent in counseling and/or coordination of care.    Harlene RAYMOND Bowl, DO [facility] Available via Epic secure chat 7am-7pm After these hours, please refer to coverage provider listed on [email or web address] 08/20/2024, 12:08 PM

## 2024-08-20 NOTE — TOC Initial Note (Signed)
 Transition of Care [facility]) - Initial/Assessment Note    Patient Details  Name: Kathleen [name] MRN: [id number] Date of Birth: 31-Dec-1960  Transition of [facility]) CM/SW Contact:    Kathleen ONEIDA Anon, RN Phone Number: 08/20/2024, 4:11 PM  Clinical Narrative:                 Pt is from home.  Pt needing continued medical workup, not medically stable for DC. Follow for Home O2. Currently using 2L O2 [place]. IP Care Management following for any DC needs.  Expected Discharge Plan: Home/Self Care Barriers to Discharge: Continued Medical Work up   Patient Goals and CMS Choice Patient states their goals for this hospitalization and ongoing recovery are:: Return home CMS Medicare.gov Compare Post Acute Care list provided to:: Patient Choice offered to / list presented to : Patient [place] ownership interest in [facility].provided to:: Patient    Expected Discharge Plan and Services In-house Referral: NA Discharge Planning Services: CM Consult Post Acute Care Choice: Durable Medical Equipment Living arrangements for the past 2 months: Single Family Home                 DME Arranged: N/A DME Agency: NA       HH Arranged: NA HH Agency: NA        Prior Living Arrangements/Services Living arrangements for the past 2 months: Single Family Home Lives with:: Relatives Patient language and need for interpreter reviewed:: Yes Do you feel safe going back to the place where you live?: Yes      Need for Family Participation in Patient Care: No (Comment) Care giver support system in place?: Yes (comment) Current home services: DME Criminal Activity/Legal Involvement Pertinent to Current Situation/Hospitalization: No - Comment as needed  Activities of Daily Living   ADL Screening (condition at time of admission) Independently performs ADLs?: Yes (appropriate for developmental age) Is the patient deaf or have difficulty hearing?: No Does the patient have difficulty seeing, even  when wearing glasses/contacts?: No Does the patient have difficulty concentrating, remembering, or making decisions?: No  Permission Sought/Granted Permission sought to share information with : Family Supports Permission granted to share information with : Yes, Verbal Permission Granted  Share Information with NAME: [name], [name] (Son)  [phone]           Emotional Assessment Appearance:: Appears older than stated age Attitude/Demeanor/Rapport: Unable to Assess Affect (typically observed): Unable to Assess Orientation: : Oriented to Self, Oriented to  Time, Oriented to Situation, Oriented to Place Alcohol / Substance Use: Not Applicable Psych Involvement: No (comment)  Admission diagnosis:  Respiratory distress [R06.03] COPD exacerbation (HCC) [J44.1] Acute respiratory failure, unspecified whether with hypoxia or hypercapnia (HCC) [J96.00] Exacerbation of asthma, unspecified asthma severity, unspecified whether persistent [J45.901] Patient Active Problem List   Diagnosis Date Noted   Respiratory distress secondary to COPD exacerbation 08/18/2024   left Metacarpal bone fracture 08/18/2024   Bipolar 1 disorder (HCC) 08/18/2024   Cocaine abuse (HCC) 06/15/2024   COPD with acute exacerbation (HCC) 06/14/2024   Urinary incontinence 03/07/2024   Cervical radiculopathy 05/22/2023   Chronic back pain 03/08/2023   OSA (obstructive sleep apnea) 03/08/2023   COPD exacerbation (HCC) 01/30/2023   Syncope and collapse 01/30/2023   Malnutrition of moderate degree 11/17/2022   Marijuana abuse 08/17/2022   Weight loss 07/07/2022   Sialoadenitis 03/16/2022   Skin growth 08/28/2021   Dysuria 08/28/2021   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   Daytime somnolence 04/06/2019  Lumbar radiculopathy 07/26/2016   Depression 09/19/2015   Healthcare maintenance 06/18/2014   Tobacco abuse 03/13/2013   GERD 01/21/2010   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   Generalized anxiety disorder  03/14/2008   Migraine variant 03/14/2008   PCP:  Renne Homans, MD Pharmacy:   [facility] [address] [place] - [address] [place] [address] Phone: [phone] Fax: [phone]     Social Drivers of Health (SDOH) Social History: SDOH Screenings   Food Insecurity: No Food Insecurity (08/18/2024)  Housing: High Risk (08/18/2024)  Transportation Needs: No Transportation Needs (08/18/2024)  Utilities: Not At Risk (08/18/2024)  Alcohol Screen: Low Risk  (05/19/2023)  Depression (PHQ2-9): High Risk (05/19/2023)  Financial Resource Strain: Medium Risk (05/19/2023)  Physical Activity: Sufficiently Active (05/19/2023)  Social Connections: Moderately Integrated (06/14/2024)  Stress: Stress Concern Present (05/19/2023)  Tobacco Use: High Risk (08/18/2024)   SDOH Interventions:     Readmission Risk Interventions    08/20/2024    4:08 PM 04/08/2023    2:51 PM 03/08/2023    4:05 PM  Readmission Risk Prevention Plan  Transportation Screening Complete  Complete  PCP or Specialist Appt within 3-5 Days Complete  Complete  HRI or Home Care Consult Complete  Complete  Social Work Consult for Recovery Care Planning/Counseling Complete  Complete  Palliative Care Screening Not Applicable  Not Applicable  Medication Review Oceanographer) Complete  Complete  PCP or Specialist appointment within 3-5 days of discharge     HRI or Home Care Consult     SW Recovery Care/Counseling Consult     Palliative Care Screening     Skilled Nursing Facility        Information is confidential and restricted. Go to Review Flowsheets to unlock data.

## 2024-08-20 NOTE — Plan of Care (Signed)

## 2024-08-21 ENCOUNTER — Inpatient Hospital Stay (HOSPITAL_COMMUNITY)

## 2024-08-21 ENCOUNTER — Encounter (HOSPITAL_COMMUNITY): Payer: Self-pay | Admitting: Family Medicine

## 2024-08-21 DIAGNOSIS — R0603 Acute respiratory distress: Secondary | ICD-10-CM | POA: Diagnosis not present

## 2024-08-21 LAB — CBC
HCT: 41.6 % (ref 36.0–46.0)
Hemoglobin: 12.7 g/dL (ref 12.0–15.0)
MCH: 27.4 pg (ref 26.0–34.0)
MCHC: 30.5 g/dL (ref 30.0–36.0)
MCV: 89.7 fL (ref 80.0–100.0)
Platelets: 287 K/uL (ref 150–400)
RBC: 4.64 MIL/uL (ref 3.87–5.11)
RDW: 13.6 % (ref 11.5–15.5)
WBC: 12.1 K/uL — ABNORMAL HIGH (ref 4.0–10.5)
nRBC: 0 % (ref 0.0–0.2)

## 2024-08-21 LAB — BASIC METABOLIC PANEL WITH GFR
Anion gap: 10 (ref 5–15)
BUN: 18 mg/dL (ref 8–23)
CO2: 26 mmol/L (ref 22–32)
Calcium: 9.3 mg/dL (ref 8.9–10.3)
Chloride: 102 mmol/L (ref 98–111)
Creatinine, Ser: 0.83 mg/dL (ref 0.44–1.00)
GFR, Estimated: >60 mL/min (ref >60)
Glucose, Bld: 138 mg/dL — ABNORMAL HIGH (ref 70–99)
Potassium: 4.9 mmol/L (ref 3.5–5.1)
Sodium: 138 mmol/L (ref 135–145)

## 2024-08-21 MED ORDER — POLYETHYLENE GLYCOL 3350 17 G PO PACK
17.0000 g | PACK | Freq: Two times a day (BID) | ORAL | Status: DC
  Administered 2024-08-21 – 2024-08-22 (×3): 17 g via ORAL
  Filled 2024-08-21 (×2): qty 1

## 2024-08-21 NOTE — Plan of Care (Signed)
  Problem: Education: Goal: Knowledge of General Education information will improve Description: Including pain rating scale, medication(s)/side effects and non-pharmacologic comfort measures Outcome: Progressing   Problem: Activity: Goal: Ability to tolerate increased activity will improve Outcome: Progressing   Problem: Respiratory: Goal: Levels of oxygenation will improve Outcome: Progressing

## 2024-08-21 NOTE — Progress Notes (Signed)
 PROGRESS NOTE    Kathleen [name]  FMW:[id number] DOB: [birth year] DOA: 08/18/2024 PCP: Renne Homans, MD    Brief Narrative:  Kathleen [name] is a 63 y.o. female with medical history significant of asthma and COPD previously on home oxygen  (over 1 year ago), bipolar 1 d/o, chronic pain, GERD, depression, HLD, OSA not on cpap, polysubstance abuse who presented to ED with complaints of respiratory distress. She has been more short of breath from baseline for about 5 days. She had been using her rescue inhaler with some success but then went to work today and used some cleaner. She was at work and sprayed some cleaner on the floor and thinks the chemicals started her to have worsening shortness of breath. She laid down on the sofa and felt light headed and short of breath so came to hospital. She was unable to talk due to her breathing. She has not had increased coughing or increased sputum production. No fever/chills. She has had a few post tussive emesis episodes. She also complaints of dysuria.    Assessment and Plan: * Respiratory distress secondary to COPD exacerbation 63 year old presenting to ED in respiratory distress requiring bipap due to COPD exacerbation  -hx of COPD-asthma overlap syndrome -likely due to cocaine +cleaner  - IV steroids -schedule duonebs -PRN SABA  -encouraged smoking and cocaine cessation  -wean to oxygen  [place] as tolerated -Home O2 eval prior to discharge  Reported abnormal vaginal bleeding - Ultrasound of pelvis ordered  Cocaine abuse (HCC) Went to rehab in August in [place] since July per patient; however, her UDS is positive for cocaine.   left Metacarpal bone fracture Followed by Dr. Shari Recently given oxycodone  on 08/15/24  Would avoid long term in setting of cocaine use   Bipolar 1 disorder (HCC) -Does not appear to be on medications for this  Chronic back pain Lidocaine  patch   OSA (obstructive sleep apnea) Not on cpap at night            DVT prophylaxis: enoxaparin  (LOVENOX ) injection 40 mg Start: 08/18/24 2200    Code Status: Full Code  Disposition Plan:  Level of care: Med-Surg Status is: Inpatient     Consultants:  None   Subjective: Overnight says she developed vaginal bleeding.  Has not had a menstrual cycle in years.  Objective: Vitals:   08/21/24 0202 08/21/24 0446 08/21/24 0746 08/21/24 0827  BP:  106/72  (!) 114/54  Pulse:  (!) 58  83  Resp:  14  14  Temp:  98.1 F (36.7 C)  97.6 F (36.4 C)  TempSrc:  Oral    SpO2: 94% 100% 99% 100%  Weight:      Height:        Intake/Output Summary (Last 24 hours) at 08/21/2024 1121 Last data filed at 08/21/2024 0600 Gross per 24 hour  Intake 480 ml  Output 0 ml  Net 480 ml   Filed Weights   08/18/24 1813  Weight: 60.9 kg    Examination:   General: Appearance:  Chronically ill appearing female in no acute distress     Lungs:   Still wheezing but improved from yesterday,  on nasal cannula  Heart:    Normal heart rate.    MS:   All extremities are intact.    Neurologic:   Awake, alert, oriented x 3. No apparent focal neurological           defect.        Data  Reviewed: I have personally reviewed following labs and imaging studies  CBC: Recent Labs  Lab 08/18/24 1500 08/19/24 0242 08/20/24 0245 08/21/24 0434  WBC 8.2 7.8 10.1 12.1*  NEUTROABS 3.5  --   --   --   HGB 12.9 12.7 12.3 12.7  HCT 42.0 41.3 40.9 41.6  MCV 88.4 88.4 90.7 89.7  PLT 306 306 271 287   Basic Metabolic Panel: Recent Labs  Lab 08/18/24 1431 08/18/24 1855 08/19/24 0242 08/20/24 0245 08/21/24 0434  NA 142  --  139 141 138  K 5.5* 3.8 4.5 4.3 4.9  CL 107  --  102 107 102  CO2 25  --  25 26 26   GLUCOSE 92  --  133* 91 138*  BUN 9  --  14 21 18   CREATININE 0.97  --  0.98 0.97 0.83  CALCIUM  9.1  --  9.2 8.8* 9.3  MG  --  2.7*  --   --   --    GFR: Estimated Creatinine Clearance: 57.4 mL/min (by C-G formula based on SCr of 0.83  mg/dL). Liver Function Tests: No results for input(s): AST, ALT, ALKPHOS, BILITOT, PROT, ALBUMIN in the last 168 hours. No results for input(s): LIPASE, AMYLASE in the last 168 hours. No results for input(s): AMMONIA in the last 168 hours. Coagulation Profile: No results for input(s): INR, PROTIME in the last 168 hours. Cardiac Enzymes: No results for input(s): CKTOTAL, CKMB, CKMBINDEX, TROPONINI in the last 168 hours. BNP (last 3 results) No results for input(s): PROBNP in the last 8760 hours. HbA1C: No results for input(s): HGBA1C in the last 72 hours. CBG: No results for input(s): GLUCAP in the last 168 hours. Lipid Profile: No results for input(s): CHOL, HDL, LDLCALC, TRIG, CHOLHDL, LDLDIRECT in the last 72 hours. Thyroid  Function Tests: No results for input(s): TSH, T4TOTAL, FREET4, T3FREE, THYROIDAB in the last 72 hours. Anemia Panel: No results for input(s): VITAMINB12, FOLATE, FERRITIN, TIBC, IRON, RETICCTPCT in the last 72 hours. Sepsis Labs: No results for input(s): PROCALCITON, LATICACIDVEN in the last 168 hours.  Recent Results (from the past 240 hours)  Resp panel by RT-PCR (RSV, Flu A&B, Covid) Anterior Nasal Swab     Status: None   Collection Time: 08/18/24  1:17 PM   Specimen: Anterior Nasal Swab  Result Value Ref Range Status   SARS Coronavirus 2 by RT PCR NEGATIVE NEGATIVE Final    Comment: (NOTE) SARS-CoV-2 target nucleic acids are NOT DETECTED.  The SARS-CoV-2 RNA is generally detectable in upper respiratory specimens during the acute phase of infection. The lowest concentration of SARS-CoV-2 viral copies this assay can detect is 138 copies/mL. A negative result does not preclude SARS-Cov-2 infection and should not be used as the sole basis for treatment or other patient management decisions. A negative result may occur with  improper specimen collection/handling, submission of  specimen other than nasopharyngeal swab, presence of viral mutation(s) within the areas targeted by this assay, and inadequate number of viral copies(<138 copies/mL). A negative result must be combined with clinical observations, patient history, and epidemiological information. The expected result is Negative.  Fact Sheet for Patients:  BloggerCourse.com  Fact Sheet for Healthcare Providers:  SeriousBroker.it  This test is no t yet approved or cleared by the [place]  FDA and  has been authorized for detection and/or diagnosis of SARS-CoV-2 by FDA under an Emergency Use Authorization (EUA). This EUA will remain  in effect (meaning this test can be used) for the duration of  the COVID-19 declaration under Section 564(b)(1) of the Act, 21 U.S.C.section 360bbb-3(b)(1), unless the authorization is terminated  or revoked sooner.       Influenza A by PCR NEGATIVE NEGATIVE Final   Influenza B by PCR NEGATIVE NEGATIVE Final    Comment: (NOTE) The Xpert Xpress SARS-CoV-2/FLU/RSV plus assay is intended as an aid in the diagnosis of influenza from Nasopharyngeal swab specimens and should not be used as a sole basis for treatment. Nasal washings and aspirates are unacceptable for Xpert Xpress SARS-CoV-2/FLU/RSV testing.  Fact Sheet for Patients: BloggerCourse.com  Fact Sheet for Healthcare Providers: SeriousBroker.it  This test is not yet approved or cleared by the [place]  FDA and has been authorized for detection and/or diagnosis of SARS-CoV-2 by FDA under an Emergency Use Authorization (EUA). This EUA will remain in effect (meaning this test can be used) for the duration of the COVID-19 declaration under Section 564(b)(1) of the Act, 21 U.S.C. section 360bbb-3(b)(1), unless the authorization is terminated or revoked.     Resp Syncytial Virus by PCR NEGATIVE NEGATIVE Final     Comment: (NOTE) Fact Sheet for Patients: BloggerCourse.com  Fact Sheet for Healthcare Providers: SeriousBroker.it  This test is not yet approved or cleared by the [place]  FDA and has been authorized for detection and/or diagnosis of SARS-CoV-2 by FDA under an Emergency Use Authorization (EUA). This EUA will remain in effect (meaning this test can be used) for the duration of the COVID-19 declaration under Section 564(b)(1) of the Act, 21 U.S.C. section 360bbb-3(b)(1), unless the authorization is terminated or revoked.  Performed at [facility], [address] [place], [place] [address]   MRSA Next Gen by PCR, Nasal     Status: None   Collection Time: 08/18/24  6:21 PM   Specimen: Nasal Mucosa; Nasal Swab  Result Value Ref Range Status   MRSA by PCR Next Gen NOT DETECTED NOT DETECTED Final    Comment: (NOTE) The GeneXpert MRSA Assay (FDA approved for NASAL specimens only), is one component of a comprehensive MRSA colonization surveillance program. It is not intended to diagnose MRSA infection nor to guide or monitor treatment for MRSA infections. Test performance is not FDA approved in patients less than 63 years old. Performed at [facility], [address] [place], [place] [address]   Urine Culture (for pregnant, neutropenic or urologic patients or patients with an indwelling urinary catheter)     Status: Abnormal   Collection Time: 08/18/24  8:31 PM   Specimen: Urine, Clean Catch  Result Value Ref Range Status   Specimen Description   Final    URINE, CLEAN CATCH Performed at [facility], [address] [place], [place] [address]    Special Requests   Final    NONE Performed at [facility], [address], [place], [place] [address]    Culture MULTIPLE SPECIES PRESENT, SUGGEST RECOLLECTION (A)  Final   Report Status 08/20/2024  FINAL  Final         Radiology Studies: No results found.       Scheduled Meds:  aspirin  EC  81 mg Oral Daily   Chlorhexidine  Gluconate Cloth  6 each Topical Daily   diclofenac  Sodium  2 g Topical QID   enoxaparin  (LOVENOX ) injection  40 mg Subcutaneous Q24H   ipratropium-albuterol   3 mL Nebulization Q6H   lidocaine   1 patch Transdermal Q24H   loratadine   10 mg Oral Daily   methylPREDNISolone  (SOLU-MEDROL ) injection  40 mg Intravenous Q12H   nicotine   7 mg Transdermal Daily   polyethylene glycol  17 g Oral BID   Continuous Infusions:   LOS: 3 days    Time spent: 45 minutes spent on chart review, discussion with nursing staff, consultants, updating family and interview/physical exam; more than 50% of that time was spent in counseling and/or coordination of care.    Harlene RAYMOND Bowl, DO [facility] Available via Epic secure chat 7am-7pm After these hours, please refer to coverage provider listed on [email or web address] 08/21/2024, 11:21 AM

## 2024-08-21 NOTE — Plan of Care (Signed)
  Problem: Clinical Measurements: Goal: Will remain free from infection Outcome: Progressing Goal: Respiratory complications will improve Outcome: Progressing Goal: Cardiovascular complication will be avoided Outcome: Progressing   Problem: Nutrition: Goal: Adequate nutrition will be maintained Outcome: Progressing   Problem: Coping: Goal: Level of anxiety will decrease Outcome: Progressing   Problem: Pain Managment: Goal: General experience of comfort will improve and/or be controlled Outcome: Progressing   Problem: Safety: Goal: Ability to remain free from injury will improve Outcome: Progressing   Problem: Skin Integrity: Goal: Risk for impaired skin integrity will decrease Outcome: Progressing   Problem: Respiratory: Goal: Ability to maintain a clear airway will improve Outcome: Progressing Goal: Levels of oxygenation will improve Outcome: Progressing Goal: Ability to maintain adequate ventilation will improve Outcome: Progressing

## 2024-08-21 NOTE — Progress Notes (Deleted)
 Discharge instructions discussed with patient, verbalized agreement and understanding

## 2024-08-22 ENCOUNTER — Telehealth: Payer: Self-pay | Admitting: *Deleted

## 2024-08-22 ENCOUNTER — Other Ambulatory Visit (HOSPITAL_COMMUNITY): Payer: Self-pay

## 2024-08-22 DIAGNOSIS — R0603 Acute respiratory distress: Secondary | ICD-10-CM | POA: Diagnosis not present

## 2024-08-22 DIAGNOSIS — N950 Postmenopausal bleeding: Secondary | ICD-10-CM | POA: Diagnosis present

## 2024-08-22 LAB — CBC
HCT: 42.5 % (ref 36.0–46.0)
Hemoglobin: 13.2 g/dL (ref 12.0–15.0)
MCH: 28.1 pg (ref 26.0–34.0)
MCHC: 31.1 g/dL (ref 30.0–36.0)
MCV: 90.6 fL (ref 80.0–100.0)
Platelets: 306 K/uL (ref 150–400)
RBC: 4.69 MIL/uL (ref 3.87–5.11)
RDW: 13.9 % (ref 11.5–15.5)
WBC: 13.6 K/uL — ABNORMAL HIGH (ref 4.0–10.5)
nRBC: 0 % (ref 0.0–0.2)

## 2024-08-22 LAB — BASIC METABOLIC PANEL WITH GFR
Anion gap: 10 (ref 5–15)
BUN: 19 mg/dL (ref 8–23)
CO2: 26 mmol/L (ref 22–32)
Calcium: 9.2 mg/dL (ref 8.9–10.3)
Chloride: 102 mmol/L (ref 98–111)
Creatinine, Ser: 0.78 mg/dL (ref 0.44–1.00)
GFR, Estimated: >60 mL/min (ref >60)
Glucose, Bld: 120 mg/dL — ABNORMAL HIGH (ref 70–99)
Potassium: 4.4 mmol/L (ref 3.5–5.1)
Sodium: 138 mmol/L (ref 135–145)

## 2024-08-22 MED ORDER — GUAIFENESIN-DM 100-10 MG/5ML PO SYRP
5.0000 mL | ORAL_SOLUTION | ORAL | 0 refills | Status: DC | PRN
  Filled 2024-08-22: qty 118, 4d supply, fill #0

## 2024-08-22 MED ORDER — PREDNISONE 20 MG PO TABS
40.0000 mg | ORAL_TABLET | Freq: Every day | ORAL | 0 refills | Status: AC
  Filled 2024-08-22: qty 10, 5d supply, fill #0

## 2024-08-22 MED ORDER — IPRATROPIUM-ALBUTEROL 0.5-2.5 (3) MG/3ML IN SOLN
3.0000 mL | Freq: Four times a day (QID) | RESPIRATORY_TRACT | 0 refills | Status: AC
  Filled 2024-08-22: qty 180, 15d supply, fill #0

## 2024-08-22 MED ORDER — AZITHROMYCIN 500 MG PO TABS
500.0000 mg | ORAL_TABLET | Freq: Every day | ORAL | 0 refills | Status: AC
Start: 2024-08-22 — End: 2024-08-25
  Filled 2024-08-22: qty 3, 3d supply, fill #0

## 2024-08-22 NOTE — Discharge Summary (Signed)
 Kathleen [name], is a 63 y.o. female  DOB 01-Jul-1961  MRN [id number].  Admission date:  08/18/2024  Admitting Physician  Isaiah Geralds, MD  Discharge Date:  08/22/2024   Primary MD  Renne Homans, MD  Recommendations for primary care physician for things to follow:   1) complete abstinence from tobacco advised 2)As discussed  it is very important that you follow-up with your OB/GYN as soon as possible for further evaluation due to your vaginal bleeding-- endometrial sampling is indicated to exclude carcinoma. If results are benign, sonohysterogram should be considered for focal lesion work-up 3) complete abstinence from cocaine advised 4) please note that there has been some Changes to your medication  Admission Diagnosis  Respiratory distress [R06.03] COPD exacerbation (HCC) [J44.1] Acute respiratory failure, unspecified whether with hypoxia or hypercapnia (HCC) [J96.00] Exacerbation of asthma, unspecified asthma severity, unspecified whether persistent [J45.901]   Discharge Diagnosis  Respiratory distress [R06.03] COPD exacerbation (HCC) [J44.1] Acute respiratory failure, unspecified whether with hypoxia or hypercapnia (HCC) [J96.00] Exacerbation of asthma, unspecified asthma severity, unspecified whether persistent [J45.901]    Principal Problem:   Respiratory distress secondary to COPD exacerbation Active Problems:   Post-menopause bleeding   Cocaine abuse (HCC)   Dysuria   left Metacarpal bone fracture   Bipolar 1 disorder (HCC)   Chronic back pain   OSA (obstructive sleep apnea)   Asthma-COPD overlap syndrome (HCC)      Past Medical History:  Diagnosis Date   Acute respiratory failure with hypoxia and hypercapnia (HCC) 03/08/2023   Anxiety    Arthritis    hands   Asthma    2 sets of PFT's in 04/09 without sign variability. Last set with significant decrease in FEV1 with saline alone,  suggesting Asthma but  recommended clinical corelation    Asthma    Asthma exacerbation 04/07/2023   Bipolar 1 disorder [facility])    therapist is Joy and is followed by [employer] [facility]   Blackout    negative work up including ESR, ANA, opthalmology referral, carotid dopplers, 2D echo, MRI and EEG.   BREAST LUMP 03/25/2008   Annotation: bilaterally Qualifier: Diagnosis of  By: Rogerio MD, Starlyn BIRCH.    Breast mass in female    s/p mammogram, u/s and biopsy in 05/09 c/w fibroadenoma,   Bronchitis    Chronic headache    Chronic low back pain 08/08/2008   Qualifier: Diagnosis of  By: Tanda  MD, Valerie     Chronic pain    normal work up including TSH, RPR, B12, HIV, plain films, 2 ESR's, ANA, CK, RF along with routine CBC, CMET and UA. Further work up includes CRP, ESR, SPEP/UPEP, hepatitis erology, A1C , repeat ANA   COPD (chronic obstructive pulmonary disease) (HCC)    COPD exacerbation (HCC) 03/31/2019   Depression    DUB (dysfunctional uterine bleeding)    and pelvic pain, with negative endometrial bx in 07/09 and transvaginal U/S significant for mild fibroids in 0/09.   Emphysema of lung (HCC)  GERD (gastroesophageal reflux disease)    Hallucinations 03/18/2008   Qualifier: Diagnosis of  By: Tanda  MD, Berwyn     Hyperlipidemia    Lower extremity edema    Neg ABI's, normal 2D echo, normal albumin   Menorrhagia    Ovarian cyst    Polysubstance abuse (HCC)    none since March 17,2009.   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   Tubular adenoma of colon     Past Surgical History:  Procedure Laterality Date   CESAREAN SECTION     CESAREAN SECTION     COLONOSCOPY     HERNIA REPAIR     Left partial oophorectomy     OOPHORECTOMY     1/2 ovary removed   OPEN REDUCTION INTERNAL FIXATION (ORIF) METACARPAL Left 05/14/2024   Procedure: OPEN REDUCTION INTERNAL FIXATION (ORIF) METACARPAL;  Surgeon: Shari Easter, MD;  Location: [facility];  Service: Orthopedics;  Laterality: Left;   POLYPECTOMY     VAGINA SURGERY     mesh       HPI  from the history and physical done on the day of admission:   Chief Complaint: short of breath/respiratory distress    HPI: Kathleen [name] is a 63 y.o. female with medical history significant of asthma and COPD previously on home oxygen  (over 1 year ago), bipolar 1 d/o, chronic pain, GERD, depression, HLD, OSA not on cpap, polysubstance abuse who presented to ED with complaints of respiratory distress. She has been more short of breath from baseline for about 5 days. She had been using her rescue inhaler with some success but then went to work today and used some cleaner. She was at work and sprayed some cleaner on the floor and thinks the chemicals started her to have worsening shortness of breath. She laid down on the sofa and felt light headed and short of breath so came to hospital. She was unable to talk due to her breathing. She has not had increased coughing or increased sputum production. No fever/chills. She has had a few post tussive emesis episodes. She also complaints of dysuria.    Last admitted for COPD exacerbation in July of this year. Did not qualify for home oxygen  at that time.    Denies any fever/chills, vision changes/headaches, chest pain or palpitations, abdominal pain, N/V/D, or  leg swelling.      She smokes 2 cigs/day. No alcohol. Denies any drug use.    ER Course:  vitals: afebrile, bp: 144/119, HR: 102, RR: 26, oxygen : 95% Pertinent labs: potassium: 5.5 (hemolyzed)  CXR: no active disease In ED: given 500cc IVF bolus, steroids, magnesium , duoneb and albuterol . Placed on bipap, [facility] asked to admit.      Review of Systems: As mentioned in the history of present illness. All other systems reviewed and are negative.     Hospital Course:    Assessment and Plan:  1)Respiratory distress secondary to COPD  exacerbation -Initially required BiPAP and oxygen  supplementation -hx of COPD-asthma overlap syndrome -likely due to cocaine +cleaner  - Overall much improved from a respiratory standpoint after IV steroids, antibiotics, Bronchodilators and mucolytics -Patient has been weaned off oxygen  -Post ambulation O2 sats on room air in the mid to high 90s -encouraged smoking and cocaine cessation  - Okay to discharge home on p.o. prednisone , azithromycin  mucolytics and bronchodilators   2)Reported abnormal postmenopausal  vaginal bleeding -Pelvic ultrasound findings noted -Outpatient follow-up with your OB/GYN as soon as possible for further evaluation due to vaginal bleeding-- endometrial sampling is indicated to exclude carcinoma. If results are benign, sonohysterogram should be considered for focal lesion work-up -Patient verbalizes understanding of these instructions   3)Cocaine abuse (HCC) Went to rehab in August in [place] -UDS is positive for cocaine.  -Patient not interested in rehab at this time   left Metacarpal bone fracture Followed by Dr. Shari Recently given oxycodone  on 08/15/24  Would avoid long term in setting of history of polysubstance abuse   Bipolar 1 disorder (HCC) -Does not appear to be on medications for this -Patient denies suicidal or homicidal ideation -Abstinence from cocaine and outpatient follow-up with psychiatrist advised   Chronic back pain Lidocaine  patch  -Avoid opiates in view of history of polysubstance abuse   OSA (obstructive sleep apnea) Not on cpap at night  -Not compliant with CPAP in the past apparently     Discharge Condition: stable, without hypoxia  Follow UP   Follow-up Information     Renne Homans, MD. Schedule an appointment as soon as possible for a visit.   Specialty: Internal Medicine Contact information: [address] [place] [address] [phone]                 Diet and Activity  recommendation:  As advised  Discharge Instructions    Discharge Instructions     Call MD for:  difficulty breathing, headache or visual disturbances   Complete by: As directed    Call MD for:  persistant dizziness or light-headedness   Complete by: As directed    Call MD for:  persistant nausea and vomiting   Complete by: As directed    Call MD for:  temperature >100.4   Complete by: As directed    Diet - low sodium heart healthy   Complete by: As directed    Discharge instructions   Complete by: As directed    1) complete abstinence from tobacco advised 2)As discussed  it is very important that you follow-up with your OB/GYN as soon as possible for further evaluation due to your vaginal bleeding-- endometrial sampling is indicated to exclude carcinoma. If results are benign, sonohysterogram should be considered for focal lesion work-up 3) complete abstinence from cocaine advised 4) please note that there has been some Changes to your medication   Increase activity slowly   Complete by: As directed         Discharge Medications     Allergies as of 08/22/2024       Reactions   Bee Venom Anaphylaxis   Peach [prunus Persica] Anaphylaxis, Swelling, Other (See Comments)   Throat swells , breaks out   Topamax [topiramate] Other (See Comments)   Hallucinations    Tramadol Nausea Only        Medication List     STOP taking these medications    aspirin  EC 81 MG tablet   B-12 PO       TAKE these medications    albuterol  (2.5 MG/3ML) 0.083% nebulizer solution Commonly known as: PROVENTIL  Take 3 mLs (2.5 mg total) by nebulization every 2 (two) hours as needed for wheezing.   azithromycin  500 MG tablet Commonly known as: ZITHROMAX  Take 1 tablet (500 mg total) by mouth daily for 3 days.   guaiFENesin -dextromethorphan  100-10 MG/5ML syrup Commonly known as: ROBITUSSIN DM Take 5 mLs by mouth every 4 (four) hours as needed  for cough.   hydrOXYzine  25 MG  tablet Commonly known as: ATARAX  TAKE 1 TABLET BY MOUTH THREE TIMES DAILY AS NEEDED FOR ANXIETY What changed: reasons to take this   ipratropium-albuterol  0.5-2.5 (3) MG/3ML Soln Commonly known as: DUONEB Take 3 mLs by nebulization every 6 (six) hours.   loratadine  10 MG tablet Commonly known as: Claritin  Take 1 tablet (10 mg total) by mouth daily.   ondansetron  4 MG tablet Commonly known as: ZOFRAN  TAKE 1 TABLET BY MOUTH ONCE DAILY AS NEEDED FOR NAUSEA FOR VOMITING What changed: See the new instructions.   predniSONE  20 MG tablet Commonly known as: DELTASONE  Take 2 tablets (40 mg total) by mouth daily with breakfast for 5 days. What changed:  how much to take how to take this when to take this additional instructions        Major procedures and Radiology Reports - PLEASE review detailed and final reports for all details, in brief -   US  PELVIC COMPLETE WITH TRANSVAGINAL Result Date: 08/21/2024 CLINICAL DATA:  Abnormal vaginal bleeding EXAM: TRANSABDOMINAL AND TRANSVAGINAL ULTRASOUND OF PELVIS TECHNIQUE: Both transabdominal and transvaginal ultrasound examinations of the pelvis were performed. Transabdominal technique was performed for global imaging of the pelvis including uterus, ovaries, adnexal regions, and pelvic cul-de-sac. It was necessary to proceed with endovaginal exam following the transabdominal exam to visualize the endometrium and adnexal structures. COMPARISON:  07/30/2014 FINDINGS: Uterus Measurements: 6.6 x 3.6 x 3.6 cm = volume: 44.7 mL. No fibroids or other mass visualized. Endometrium Thickness: 5 mm.  No focal abnormality visualized. Right ovary Not visualized. Left ovary Not visualized. Other findings No pelvic free fluid or adnexal masses are visualized. IMPRESSION: 1. Nonvisualization of the bilateral ovaries. 2. Unremarkable appearance of the uterus, with endometrium measuring 5 mm. In the setting of post-menopausal bleeding, endometrial sampling is indicated  to exclude carcinoma. If results are benign, sonohysterogram should be considered for focal lesion work-up. (Ref: Radiological Reasoning: Algorithmic Workup of Abnormal Vaginal Bleeding with Endovaginal Sonography and Sonohysterography. AJR 2008; [id number]) Electronically Signed   By: Ozell Daring M.D.   On: 08/21/2024 18:11   DG Chest 1 View Result Date: 08/18/2024 CLINICAL DATA:  Shortness of breath. EXAM: CHEST  1 VIEW COMPARISON:  June 14, 2024. FINDINGS: The heart size and mediastinal contours are within normal limits. Both lungs are clear. The visualized skeletal structures are unremarkable. IMPRESSION: No active disease. Electronically Signed   By: Lynwood Landy Raddle M.D.   On: 08/18/2024 13:27    Micro Results   Recent Results (from the past 240 hours)  Resp panel by RT-PCR (RSV, Flu A&B, Covid) Anterior Nasal Swab     Status: None   Collection Time: 08/18/24  1:17 PM   Specimen: Anterior Nasal Swab  Result Value Ref Range Status   SARS Coronavirus 2 by RT PCR NEGATIVE NEGATIVE Final    Comment: (NOTE) SARS-CoV-2 target nucleic acids are NOT DETECTED.  The SARS-CoV-2 RNA is generally detectable in upper respiratory specimens during the acute phase of infection. The lowest concentration of SARS-CoV-2 viral copies this assay can detect is 138 copies/mL. A negative result does not preclude SARS-Cov-2 infection and should not be used as the sole basis for treatment or other patient management decisions. A negative result may occur with  improper specimen collection/handling, submission of specimen other than nasopharyngeal swab, presence of viral mutation(s) within the areas targeted by this assay, and inadequate number of viral copies(<138 copies/mL). A negative result must be combined with clinical  observations, patient history, and epidemiological information. The expected result is Negative.  Fact Sheet for Patients:  BloggerCourse.com  Fact Sheet for  Healthcare Providers:  SeriousBroker.it  This test is no t yet approved or cleared by the [place]  FDA and  has been authorized for detection and/or diagnosis of SARS-CoV-2 by FDA under an Emergency Use Authorization (EUA). This EUA will remain  in effect (meaning this test can be used) for the duration of the COVID-19 declaration under Section 564(b)(1) of the Act, 21 U.S.C.section 360bbb-3(b)(1), unless the authorization is terminated  or revoked sooner.       Influenza A by PCR NEGATIVE NEGATIVE Final   Influenza B by PCR NEGATIVE NEGATIVE Final    Comment: (NOTE) The Xpert Xpress SARS-CoV-2/FLU/RSV plus assay is intended as an aid in the diagnosis of influenza from Nasopharyngeal swab specimens and should not be used as a sole basis for treatment. Nasal washings and aspirates are unacceptable for Xpert Xpress SARS-CoV-2/FLU/RSV testing.  Fact Sheet for Patients: BloggerCourse.com  Fact Sheet for Healthcare Providers: SeriousBroker.it  This test is not yet approved or cleared by the [place]  FDA and has been authorized for detection and/or diagnosis of SARS-CoV-2 by FDA under an Emergency Use Authorization (EUA). This EUA will remain in effect (meaning this test can be used) for the duration of the COVID-19 declaration under Section 564(b)(1) of the Act, 21 U.S.C. section 360bbb-3(b)(1), unless the authorization is terminated or revoked.     Resp Syncytial Virus by PCR NEGATIVE NEGATIVE Final    Comment: (NOTE) Fact Sheet for Patients: BloggerCourse.com  Fact Sheet for Healthcare Providers: SeriousBroker.it  This test is not yet approved or cleared by the [place]  FDA and has been authorized for detection and/or diagnosis of SARS-CoV-2 by FDA under an Emergency Use Authorization (EUA). This EUA will remain in effect (meaning this  test can be used) for the duration of the COVID-19 declaration under Section 564(b)(1) of the Act, 21 U.S.C. section 360bbb-3(b)(1), unless the authorization is terminated or revoked.  Performed at [facility], [address] [place], [place] [address]   MRSA Next Gen by PCR, Nasal     Status: None   Collection Time: 08/18/24  6:21 PM   Specimen: Nasal Mucosa; Nasal Swab  Result Value Ref Range Status   MRSA by PCR Next Gen NOT DETECTED NOT DETECTED Final    Comment: (NOTE) The GeneXpert MRSA Assay (FDA approved for NASAL specimens only), is one component of a comprehensive MRSA colonization surveillance program. It is not intended to diagnose MRSA infection nor to guide or monitor treatment for MRSA infections. Test performance is not FDA approved in patients less than 2 years old. Performed at [facility], [address], [place], [place] [address]   Urine Culture (for pregnant, neutropenic or urologic patients or patients with an indwelling urinary catheter)     Status: Abnormal   Collection Time: 08/18/24  8:31 PM   Specimen: Urine, Clean Catch  Result Value Ref Range Status   Specimen Description   Final    URINE, CLEAN CATCH Performed at [facility], [address], [place], [place] [address]    Special Requests   Final    NONE Performed at [facility], [address] [place], [place] [address]    Culture MULTIPLE SPECIES PRESENT, SUGGEST RECOLLECTION (A)  Final   Report Status 08/20/2024 FINAL  Final    Today   Subjective  Kathleen [name] today has no new complaints   --Post ambulation O2 sats on room air in the mid to high 90s,-dyspnea on exertion, or  wheezing -No fever  Or chills   No Nausea, Vomiting or Diarrhea  -- RN at bedside, questions answered    Patient has been seen and examined prior to discharge   Objective   Blood pressure (!) 134/92, pulse 67, temperature  97.7 F (36.5 C), temperature source Oral, resp. rate 18, height 5' 3 (1.6 m), weight 60.9 kg, last menstrual period 11/08/2014, SpO2 100%.   Intake/Output Summary (Last 24 hours) at 08/22/2024 1018 Last data filed at 08/22/2024 0600 Gross per 24 hour  Intake 1440 ml  Output --  Net 1440 ml    Exam Gen:- Awake Alert, no acute distress, speaking in complete sentences HEENT:- [place].AT, No sclera icterus Neck-Supple Neck,No JVD,.  Lungs-  CTAB , good air movement bilaterally, no wheezing or rhonchi CV- S1, S2 normal, regular Abd-  +ve B.Sounds, Abd Soft, No tenderness,    Extremity/Skin:- No  edema,   good pulses Psych-affect is appropriate, oriented x3 Neuro-no new focal deficits, no tremors    Data Review   CBC w Diff:  Lab Results  Component Value Date   WBC 13.6 (H) 08/22/2024   HGB 13.2 08/22/2024   HGB 15.7 07/07/2022   HCT 42.5 08/22/2024   HCT 46.5 07/07/2022   PLT 306 08/22/2024   PLT 364 07/07/2022   LYMPHOPCT 37 08/18/2024   MONOPCT 10 08/18/2024   EOSPCT 10 08/18/2024   BASOPCT 1 08/18/2024    CMP:  Lab Results  Component Value Date   NA 138 08/22/2024   NA 142 07/07/2022   K 4.4 08/22/2024   CL 102 08/22/2024   CO2 26 08/22/2024   BUN 19 08/22/2024   BUN 10 07/07/2022   CREATININE 0.78 08/22/2024   CREATININE 1.01 05/24/2014   PROT 5.7 (L) 04/07/2023   PROT 6.2 08/09/2019   ALBUMIN 3.2 (L) 04/07/2023   ALBUMIN 4.1 08/09/2019   BILITOT 0.6 04/07/2023   BILITOT <0.2 08/09/2019   ALKPHOS 79 04/07/2023   AST 30 04/07/2023   ALT 32 04/07/2023  .  Total Discharge time is about 33 minutes  Rendall Carwin M.D on 08/22/2024 at 10:18 AM  Go to [email or web address] -  for contact info  [facility] - Office  [phone]

## 2024-08-22 NOTE — Telephone Encounter (Signed)
 Received a call from pt who's in the hospital at [facility]; being discharged this am. But pt stated she's having vaginal bleeding, in the bathroom when she called. Also c/o sob.dizziness,weakness,wheezing. Stated she's afraid to go home and have to come straight back to the hospital. Stated she was told to scheduled an appt w/our office for a PAP. Stated vaginal u/s was done. She wants to know why she's being discharged. I told pt she needs to talk her hospital nurse and doctor to voice her concerns.

## 2024-08-22 NOTE — Discharge Instructions (Signed)
 1) complete abstinence from tobacco advised 2)As discussed  it is very important that you follow-up with your OB/GYN as soon as possible for further evaluation due to your vaginal bleeding-- endometrial sampling is indicated to exclude carcinoma. If results are benign, sonohysterogram should be considered for focal lesion work-up 3) complete abstinence from cocaine advised 4) please note that there has been some Changes to your medication

## 2024-08-22 NOTE — Progress Notes (Signed)
 Discharge meds in a secure bag - this RN attempted to deliver- pt had discharged to home prior to my arrival - medications returned to [facility] in a secure bag by this  RN

## 2024-08-24 ENCOUNTER — Other Ambulatory Visit (HOSPITAL_COMMUNITY): Payer: Self-pay

## 2024-08-27 ENCOUNTER — Inpatient Hospital Stay: Payer: Self-pay

## 2024-08-28 ENCOUNTER — Ambulatory Visit

## 2024-09-03 ENCOUNTER — Inpatient Hospital Stay (HOSPITAL_COMMUNITY)
Admission: EM | Admit: 2024-09-03 | Discharge: 2024-09-06 | DRG: 202 | Disposition: A | Discharge status: Home / Self Care | Attending: Internal Medicine | Admitting: Internal Medicine

## 2024-09-03 ENCOUNTER — Other Ambulatory Visit: Payer: Self-pay

## 2024-09-03 ENCOUNTER — Emergency Department (HOSPITAL_COMMUNITY)

## 2024-09-03 ENCOUNTER — Encounter (HOSPITAL_COMMUNITY): Payer: Self-pay | Admitting: Emergency Medicine

## 2024-09-03 DIAGNOSIS — Z885 Allergy status to narcotic agent status: Secondary | ICD-10-CM

## 2024-09-03 DIAGNOSIS — K219 Gastro-esophageal reflux disease without esophagitis: Secondary | ICD-10-CM | POA: Diagnosis present

## 2024-09-03 DIAGNOSIS — Z888 Allergy status to other drugs, medicaments and biological substances status: Secondary | ICD-10-CM

## 2024-09-03 DIAGNOSIS — R0603 Acute respiratory distress: Secondary | ICD-10-CM

## 2024-09-03 DIAGNOSIS — J439 Emphysema, unspecified: Secondary | ICD-10-CM | POA: Diagnosis present

## 2024-09-03 DIAGNOSIS — J441 Chronic obstructive pulmonary disease with (acute) exacerbation: Secondary | ICD-10-CM | POA: Diagnosis not present

## 2024-09-03 DIAGNOSIS — F1210 Cannabis abuse, uncomplicated: Secondary | ICD-10-CM | POA: Diagnosis present

## 2024-09-03 DIAGNOSIS — T380X5A Adverse effect of glucocorticoids and synthetic analogues, initial encounter: Secondary | ICD-10-CM | POA: Diagnosis not present

## 2024-09-03 DIAGNOSIS — Z720 Tobacco use: Secondary | ICD-10-CM | POA: Diagnosis present

## 2024-09-03 DIAGNOSIS — G4733 Obstructive sleep apnea (adult) (pediatric): Secondary | ICD-10-CM | POA: Diagnosis present

## 2024-09-03 DIAGNOSIS — Z91018 Allergy to other foods: Secondary | ICD-10-CM

## 2024-09-03 DIAGNOSIS — G4730 Sleep apnea, unspecified: Secondary | ICD-10-CM | POA: Diagnosis present

## 2024-09-03 DIAGNOSIS — Z91030 Bee allergy status: Secondary | ICD-10-CM

## 2024-09-03 DIAGNOSIS — R739 Hyperglycemia, unspecified: Secondary | ICD-10-CM | POA: Diagnosis not present

## 2024-09-03 DIAGNOSIS — Z860101 Personal history of adenomatous and serrated colon polyps: Secondary | ICD-10-CM

## 2024-09-03 DIAGNOSIS — F17210 Nicotine dependence, cigarettes, uncomplicated: Secondary | ICD-10-CM | POA: Diagnosis present

## 2024-09-03 DIAGNOSIS — F1410 Cocaine abuse, uncomplicated: Secondary | ICD-10-CM | POA: Diagnosis present

## 2024-09-03 DIAGNOSIS — Z8249 Family history of ischemic heart disease and other diseases of the circulatory system: Secondary | ICD-10-CM

## 2024-09-03 DIAGNOSIS — K1120 Sialoadenitis, unspecified: Secondary | ICD-10-CM | POA: Diagnosis present

## 2024-09-03 DIAGNOSIS — J45901 Unspecified asthma with (acute) exacerbation: Principal | ICD-10-CM | POA: Diagnosis present

## 2024-09-03 DIAGNOSIS — D649 Anemia, unspecified: Secondary | ICD-10-CM | POA: Diagnosis present

## 2024-09-03 DIAGNOSIS — F419 Anxiety disorder, unspecified: Secondary | ICD-10-CM | POA: Diagnosis present

## 2024-09-03 DIAGNOSIS — F319 Bipolar disorder, unspecified: Secondary | ICD-10-CM | POA: Diagnosis present

## 2024-09-03 DIAGNOSIS — N950 Postmenopausal bleeding: Secondary | ICD-10-CM | POA: Diagnosis present

## 2024-09-03 DIAGNOSIS — E785 Hyperlipidemia, unspecified: Secondary | ICD-10-CM | POA: Diagnosis present

## 2024-09-03 LAB — CBC
HCT: 42 % (ref 36.0–46.0)
Hemoglobin: 13.4 g/dL (ref 12.0–15.0)
MCH: 28.2 pg (ref 26.0–34.0)
MCHC: 31.9 g/dL (ref 30.0–36.0)
MCV: 88.2 fL (ref 80.0–100.0)
Platelets: 319 K/uL (ref 150–400)
RBC: 4.76 MIL/uL (ref 3.87–5.11)
RDW: 13.3 % (ref 11.5–15.5)
WBC: 8.4 K/uL (ref 4.0–10.5)
nRBC: 0 % (ref 0.0–0.2)

## 2024-09-03 LAB — BLOOD GAS, VENOUS
Acid-Base Excess: 6.3 mmol/L — ABNORMAL HIGH (ref 0.0–2.0)
Bicarbonate: 32.3 mmol/L — ABNORMAL HIGH (ref 20.0–28.0)
O2 Saturation: 53.5 %
Patient temperature: 37
pCO2, Ven: 51 mmHg (ref 44–60)
pH, Ven: 7.41 (ref 7.25–7.43)
pO2, Ven: <31 mmHg — CL (ref 32–45)

## 2024-09-03 LAB — BASIC METABOLIC PANEL WITH GFR
Anion gap: 10 (ref 5–15)
BUN: 11 mg/dL (ref 8–23)
CO2: 26 mmol/L (ref 22–32)
Calcium: 9 mg/dL (ref 8.9–10.3)
Chloride: 107 mmol/L (ref 98–111)
Creatinine, Ser: 0.97 mg/dL (ref 0.44–1.00)
GFR, Estimated: >60 mL/min (ref >60)
Glucose, Bld: 82 mg/dL (ref 70–99)
Potassium: 3.7 mmol/L (ref 3.5–5.1)
Sodium: 142 mmol/L (ref 135–145)

## 2024-09-03 LAB — TROPONIN T, HIGH SENSITIVITY
Troponin T High Sensitivity: <15 ng/L (ref 0–19)
Troponin T High Sensitivity: <15 ng/L (ref 0–19)

## 2024-09-03 LAB — PRO BRAIN NATRIURETIC PEPTIDE: Pro Brain Natriuretic Peptide: 184 pg/mL (ref <300.0)

## 2024-09-03 MED ORDER — HYDROMORPHONE HCL 1 MG/ML IJ SOLN
0.5000 mg | Freq: Once | INTRAMUSCULAR | Status: AC
  Administered 2024-09-03: 0.5 mg via INTRAVENOUS
  Filled 2024-09-03: qty 1

## 2024-09-03 MED ORDER — BUDESONIDE 0.25 MG/2ML IN SUSP
0.2500 mg | Freq: Two times a day (BID) | RESPIRATORY_TRACT | Status: DC
  Administered 2024-09-03 – 2024-09-06 (×6): 0.25 mg via RESPIRATORY_TRACT
  Filled 2024-09-03 (×6): qty 2

## 2024-09-03 MED ORDER — ACETAMINOPHEN 325 MG PO TABS: 650.0000 mg | ORAL_TABLET | Freq: Four times a day (QID) | ORAL | Status: DC | PRN

## 2024-09-03 MED ORDER — IPRATROPIUM-ALBUTEROL 0.5-2.5 (3) MG/3ML IN SOLN
3.0000 mL | Freq: Four times a day (QID) | RESPIRATORY_TRACT | Status: DC
  Administered 2024-09-03 – 2024-09-04 (×2): 3 mL via RESPIRATORY_TRACT
  Filled 2024-09-03 (×2): qty 3

## 2024-09-03 MED ORDER — ENOXAPARIN SODIUM 40 MG/0.4ML IJ SOSY
40.0000 mg | PREFILLED_SYRINGE | INTRAMUSCULAR | Status: DC
  Administered 2024-09-04 – 2024-09-06 (×3): 40 mg via SUBCUTANEOUS
  Filled 2024-09-03 (×3): qty 0.4

## 2024-09-03 MED ORDER — OXYCODONE HCL 5 MG PO TABS
5.0000 mg | ORAL_TABLET | ORAL | Status: DC | PRN
  Administered 2024-09-03 – 2024-09-06 (×8): 5 mg via ORAL
  Filled 2024-09-03 (×10): qty 1

## 2024-09-03 MED ORDER — ALBUTEROL SULFATE (2.5 MG/3ML) 0.083% IN NEBU
2.5000 mg | INHALATION_SOLUTION | Freq: Once | RESPIRATORY_TRACT | Status: AC
  Administered 2024-09-03: 2.5 mg via RESPIRATORY_TRACT
  Filled 2024-09-03: qty 3

## 2024-09-03 MED ORDER — ONDANSETRON HCL 4 MG/2ML IJ SOLN
4.0000 mg | Freq: Four times a day (QID) | INTRAMUSCULAR | Status: DC | PRN
  Administered 2024-09-04: 4 mg via INTRAVENOUS
  Filled 2024-09-03: qty 2

## 2024-09-03 MED ORDER — IPRATROPIUM-ALBUTEROL 0.5-2.5 (3) MG/3ML IN SOLN
3.0000 mL | Freq: Once | RESPIRATORY_TRACT | Status: AC
  Administered 2024-09-03: 3 mL via RESPIRATORY_TRACT
  Filled 2024-09-03: qty 3

## 2024-09-03 MED ORDER — ARFORMOTEROL TARTRATE 15 MCG/2ML IN NEBU
15.0000 ug | INHALATION_SOLUTION | Freq: Two times a day (BID) | RESPIRATORY_TRACT | Status: DC
  Administered 2024-09-03 – 2024-09-06 (×6): 15 ug via RESPIRATORY_TRACT
  Filled 2024-09-03 (×6): qty 2

## 2024-09-03 MED ORDER — AZITHROMYCIN 250 MG PO TABS
500.0000 mg | ORAL_TABLET | Freq: Every day | ORAL | Status: DC
  Administered 2024-09-03 – 2024-09-05 (×3): 500 mg via ORAL
  Filled 2024-09-03 (×3): qty 2

## 2024-09-03 MED ORDER — METHYLPREDNISOLONE SODIUM SUCC 125 MG IJ SOLR
125.0000 mg | Freq: Once | INTRAMUSCULAR | Status: AC
  Administered 2024-09-03: 125 mg via INTRAVENOUS
  Filled 2024-09-03: qty 2

## 2024-09-03 MED ORDER — MAGNESIUM SULFATE 2 GM/50ML IV SOLN
2.0000 g | Freq: Once | INTRAVENOUS | Status: AC
  Administered 2024-09-03: 2 g via INTRAVENOUS
  Filled 2024-09-03: qty 50

## 2024-09-03 MED ORDER — ACETAMINOPHEN 650 MG RE SUPP: 650.0000 mg | Freq: Four times a day (QID) | RECTAL | Status: DC | PRN

## 2024-09-03 MED ORDER — ONDANSETRON HCL 4 MG PO TABS
4.0000 mg | ORAL_TABLET | Freq: Four times a day (QID) | ORAL | Status: DC | PRN
  Administered 2024-09-05 – 2024-09-06 (×2): 4 mg via ORAL
  Filled 2024-09-03 (×3): qty 1

## 2024-09-03 MED ORDER — PREDNISONE 20 MG PO TABS
40.0000 mg | ORAL_TABLET | Freq: Every day | ORAL | Status: DC
  Administered 2024-09-04: 40 mg via ORAL
  Filled 2024-09-03: qty 2

## 2024-09-03 MED ORDER — NICOTINE 14 MG/24HR TD PT24
14.0000 mg | MEDICATED_PATCH | Freq: Every day | TRANSDERMAL | Status: DC
  Administered 2024-09-04 – 2024-09-06 (×3): 14 mg via TRANSDERMAL
  Filled 2024-09-03 (×3): qty 1

## 2024-09-03 NOTE — H&P (Signed)
 History and Physical    Kathleen [name] DOB: Oct 25, 1961 DOA: 09/03/2024  I have briefly reviewed the patient's prior medical records in [facility] Link  PCP: Renne Homans, MD  Patient coming from: home  Chief Complaint: shortness of breath   HPI: Kathleen [name] is a 64 y.o. female with medical history significant of COPD, prior tobacco use last time about 3 weeks ago, bipolar disorder, comes into the hospital with complaints of shortness of breath.  She was hospitalized about 2 weeks ago with COPD exacerbation in the setting of cocaine use as well as being exposed to cleaners (she cleans houses for living), felt well upon discharge however she appears to become more more short of breath over the last 2 days.  She denies any occupational exposure as she has not been back to work in the last 2 weeks.  She also reports that she did some cocaine shortly after being discharged last week, but nothing in the last 7 days.  She denies any fever or chills, no muscle aches, sore throat, runny nose, no sick contacts.  She denies any smoking since past discharge.  She reports increasing wheezing and significant shortness of breath with minimal activity.  Even just now, went to the bathroom and back, and while acknowledges her saturations have remained stable she felt extremely short of breath upon returning to her bed.  She also complains of chest tightness with wheezing  ED Course: In the emergency room she is afebrile, normotensive, tachypneic at times.  Her blood work is essentially unremarkable with normal BNP, negative troponin, CBC and BMP are unremarkable.  Chest x-ray was negative.  Due to persistent shortness of breath and wheezing following ED interventions, we are asked to admit  Review of Systems: All systems reviewed, and apart from HPI, all negative  Past Medical History:  Diagnosis Date   Acute respiratory failure with hypoxia and hypercapnia (HCC) 03/08/2023   Anxiety     Arthritis    hands   Asthma    2 sets of PFT's in 04/09 without sign variability. Last set with significant decrease in FEV1 with saline alone, suggesting Asthma but  recommended clinical corelation    Asthma    Asthma exacerbation 04/07/2023   Bipolar 1 disorder [facility])    therapist is Joy and is followed by [employer] [facility]   Blackout    negative work up including ESR, ANA, opthalmology referral, carotid dopplers, 2D echo, MRI and EEG.   BREAST LUMP 03/25/2008   Annotation: bilaterally Qualifier: Diagnosis of  By: Rogerio MD, Starlyn BIRCH.    Breast mass in female    s/p mammogram, u/s and biopsy in 05/09 c/w fibroadenoma,   Bronchitis    Chronic headache    Chronic low back pain 08/08/2008   Qualifier: Diagnosis of  By: Tanda  MD, Valerie     Chronic pain    normal work up including TSH, RPR, B12, HIV, plain films, 2 ESR's, ANA, CK, RF along with routine CBC, CMET and UA. Further work up includes CRP, ESR, SPEP/UPEP, hepatitis erology, A1C , repeat ANA   COPD (chronic obstructive pulmonary disease) (HCC)    COPD exacerbation (HCC) 03/31/2019   Depression    DUB (dysfunctional uterine bleeding)    and pelvic pain, with negative endometrial bx in 07/09 and transvaginal U/S significant for mild fibroids in 0/09.   Emphysema of lung (HCC)    GERD (gastroesophageal reflux disease)    Hallucinations 03/18/2008   Qualifier:  Diagnosis of  By: Tanda  MD, Berwyn     Hyperlipidemia    Lower extremity edema    Neg ABI's, normal 2D echo, normal albumin   Menorrhagia    Ovarian cyst    Polysubstance abuse (HCC)    none since March 17,2009.   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 She started smoking about 37 years ago. She has a 7.5 pack-year smoking history. She has never used smokeless tobacco. She reports current alcohol use. She reports current drug use. Drug: Marijuana.  Allergies  Allergen Reactions   Bee Venom Anaphylaxis   Peach [Prunus Persica] Anaphylaxis, Swelling and Other (See Comments)    Throat swells , breaks out   Topamax [Topiramate] Other (See Comments)    Hallucinations    Tramadol Nausea Only    Family History  Problem Relation Age of Onset   Colon cancer Mother 72   Breast cancer Mother 2   Rectal cancer Mother    Diabetes Father    Hypertension Father    Kidney disease Father    Colon cancer Father 33   Prostate cancer Father    Cancer Father    Kidney disease Sister    Cancer Brother    Breast cancer Maternal Grandmother 103   Esophageal cancer Neg Hx    Stomach cancer Neg Hx     Prior to Admission medications   Medication Sig Start Date End Date Taking? Authorizing Provider  albuterol  (PROVENTIL ) (2.5 MG/3ML) 0.083% nebulizer solution Take 3 mLs (2.5 mg total) by nebulization every 2 (two) hours as needed for wheezing. 06/15/24   Barbarann Nest, MD  guaiFENesin -dextromethorphan  (ROBITUSSIN DM) 100-10 MG/5ML syrup Take 5 mLs by mouth every 4 (four) hours as needed for cough. 08/22/24   Pearlean Manus, MD  hydrOXYzine  (ATARAX ) 25 MG tablet TAKE 1 TABLET BY MOUTH THREE TIMES DAILY AS NEEDED FOR ANXIETY Patient taking differently: Take 25 mg by mouth 3 (three) times daily as needed for anxiety. 07/18/24   Amoako, Prince, MD  ipratropium-albuterol  (DUONEB) 0.5-2.5 (3) MG/3ML SOLN Take 3 mLs by nebulization  every 6 (six) hours. 08/22/24   Pearlean Manus, MD  loratadine  (CLARITIN ) 10 MG tablet Take 1 tablet (10 mg total) by mouth daily. 02/01/23   Samtani, Jai-Gurmukh, MD  ondansetron  (ZOFRAN ) 4 MG tablet TAKE 1 TABLET BY MOUTH ONCE DAILY AS NEEDED FOR NAUSEA FOR VOMITING Patient taking differently: Take 4 mg by mouth daily as needed for vomiting or nausea. 06/07/24   Renne Homans, MD    Physical Exam: Vitals:   09/03/24 1815 09/03/24 1900 09/03/24 2000 09/03/24 2021  BP: 118/80 (!) 143/100 114/74 112/81  Pulse: 82 75 87 89  Resp: 18 (!) 23 19  15  Temp:    98.4 F (36.9 C)  TempSrc:    Oral  SpO2: 96% 100% 96% 99%   Constitutional: NAD, calm, comfortable Eyes: PERRL, lids and conjunctivae normal ENMT: Mucous membranes are moist.  Neck: normal, supple Respiratory: End expiratory wheezing bilateral lung fields, slightly increased respiratory effort Cardiovascular: Regular rate and rhythm, no murmurs / rubs / gallops.  Trace edema Abdomen: no tenderness, no masses palpated. Bowel sounds positive.  Musculoskeletal: no clubbing / cyanosis. Normal muscle tone.  Skin: no rashes, lesions, ulcers. No induration Neurologic: Nonfocal  Labs on Admission: I have personally reviewed following labs and imaging studies  CBC: Recent Labs  Lab 09/03/24 1738  WBC 8.4  HGB 13.4  HCT 42.0  MCV 88.2  PLT 319   Basic Metabolic Panel: Recent Labs  Lab 09/03/24 1738  NA 142  K 3.7  CL 107  CO2 26  GLUCOSE 82  BUN 11  CREATININE 0.97  CALCIUM  9.0   Liver Function Tests: No results for input(s): AST, ALT, ALKPHOS, BILITOT, PROT, ALBUMIN in the last 168 hours. Coagulation Profile: No results for input(s): INR, PROTIME in the last 168 hours. BNP (last 3 results) Recent Labs    09/03/24 1747  PROBNP 184.0   CBG: No results for input(s): GLUCAP in the last 168 hours. Thyroid  Function Tests: No results for input(s): TSH, T4TOTAL, FREET4, T3FREE, THYROIDAB in  the last 72 hours. Urine analysis:    Component Value Date/Time   COLORURINE YELLOW 08/18/2024 1744   APPEARANCEUR HAZY (A) 08/18/2024 1744   APPEARANCEUR Clear 03/06/2024 1637   LABSPEC 1.015 08/18/2024 1744   PHURINE 6.0 08/18/2024 1744   GLUCOSEU NEGATIVE 08/18/2024 1744   GLUCOSEU NEG mg/dL 95/71/7990 7962   HGBUR SMALL (A) 08/18/2024 1744   BILIRUBINUR NEGATIVE 08/18/2024 1744   BILIRUBINUR Negative 03/06/2024 1641   BILIRUBINUR Negative 03/06/2024 1637   KETONESUR NEGATIVE 08/18/2024 1744   PROTEINUR NEGATIVE 08/18/2024 1744   UROBILINOGEN 0.2 03/06/2024 1641   UROBILINOGEN 0.2 03/11/2015 1323   NITRITE NEGATIVE 08/18/2024 1744   LEUKOCYTESUR LARGE (A) 08/18/2024 1744     Radiological Exams on Admission: DG Chest Port 1 View Result Date: 09/03/2024 CLINICAL DATA:  soband chest pain. EXAM: PORTABLE CHEST - 1 VIEW COMPARISON:  08/18/2024 FINDINGS: No focal airspace consolidation, pleural effusion, or pneumothorax. No cardiomegaly.No acute fracture or destructive lesion. IMPRESSION: No acute cardiopulmonary abnormality. Electronically Signed   By: Rogelia Myers M.D.   On: 09/03/2024 17:01    EKG: Independently reviewed.  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  Has been using nicotine  patch at home, continue that here  Cocaine use-counseled for cessation, did admit to use once as soon as she was discharged 2 weeks ago  Bipolar 1 disorder-does not appear to be on medications for this.  Outpatient follow-up  DVT prophylaxis: Lovenox  Code  Status: Full code Family Communication: No family at bedside Bed Type: MedSurg Consults called: None Obs/Inp: Observation  Nilda Fendt, MD, PhD [facility]  Contact via [email or web address]  09/03/2024, 9:07 PM

## 2024-09-03 NOTE — ED Notes (Signed)
 Patient ambulated from room 8 to bathroom SpO2 98% to and from.

## 2024-09-03 NOTE — Telephone Encounter (Signed)
 Multidisciplinary Oncology Program Intake Form  Referral Receive Date:  09/06/24  Rapid Access Appointment : N/A  Reason for Referral:  Patient Diagnosis: sialoadenitis Internal Referral Disease Group: Head and Neck Specialty:  Medical Oncology  Referral Method:  Epic  Insurance Primary Insurance: Private/ACA/Employer    Authorization Obtained: NA   Record Collection:   RECORDS -  INTERNAL REFERRAL  Screening Questions:   If <43, is the patient interested in learning about [facility] Fertility Preservation? NA  Close the Loop Communication: Referring Provider Contacted: Yes Patient Contacted: Yes  Welcome Packet  Date Sent: 09/03/24 Sent: Email

## 2024-09-03 NOTE — ED Provider Notes (Signed)
 [place] EMERGENCY DEPARTMENT AT [facility] Provider Note   CSN: [id number] Arrival date & time: 09/03/24  [id number]     Patient presents with: Shortness of Breath   Kathleen [name] is a 63 y.o. female.  {Add pertinent medical, surgical, social history, OB history to YEP:67052} Patient has a history of COPD.  Patient comes in here short of breath and wheezing.   Shortness of Breath      Prior to Admission medications   Medication Sig Start Date End Date Taking? Authorizing Provider  albuterol  (PROVENTIL ) (2.5 MG/3ML) 0.083% nebulizer solution Take 3 mLs (2.5 mg total) by nebulization every 2 (two) hours as needed for wheezing. 06/15/24   Barbarann Nest, MD  guaiFENesin -dextromethorphan  (ROBITUSSIN DM) 100-10 MG/5ML syrup Take 5 mLs by mouth every 4 (four) hours as needed for cough. 08/22/24   Pearlean Manus, MD  hydrOXYzine  (ATARAX ) 25 MG tablet TAKE 1 TABLET BY MOUTH THREE TIMES DAILY AS NEEDED FOR ANXIETY Patient taking differently: Take 25 mg by mouth 3 (three) times daily as needed for anxiety. 07/18/24   Amoako, Prince, MD  ipratropium-albuterol  (DUONEB) 0.5-2.5 (3) MG/3ML SOLN Take 3 mLs by nebulization every 6 (six) hours. 08/22/24   Pearlean Manus, MD  loratadine  (CLARITIN ) 10 MG tablet Take 1 tablet (10 mg total) by mouth daily. 02/01/23   Samtani, Jai-Gurmukh, MD  ondansetron  (ZOFRAN ) 4 MG tablet TAKE 1 TABLET BY MOUTH ONCE DAILY AS NEEDED FOR NAUSEA FOR VOMITING Patient taking differently: Take 4 mg by mouth daily as needed for vomiting or nausea. 06/07/24   Renne Homans, MD    Allergies: Bee venom, Peach [prunus persica], Topamax [topiramate], and Tramadol    Review of Systems  Respiratory:  Positive for shortness of breath.     Updated Vital Signs BP 112/81 (BP Location: Right Arm)   Pulse 89   Temp 98.4 F (36.9 C) (Oral)   Resp 15   LMP 11/08/2014   SpO2 99%   Physical Exam  (all labs ordered are listed, but only abnormal results are  displayed) Labs Reviewed  BLOOD GAS, VENOUS - Abnormal; Notable for the following components:      Result Value   pO2, Ven <31 (*)    Bicarbonate 32.3 (*)    Acid-Base Excess 6.3 (*)    All other components within normal limits  BASIC METABOLIC PANEL WITH GFR  CBC  PRO BRAIN NATRIURETIC PEPTIDE  TROPONIN T, HIGH SENSITIVITY  TROPONIN T, HIGH SENSITIVITY    EKG: None  Radiology: [facility] Chest Port 1 View Result Date: 09/03/2024 CLINICAL DATA:  soband chest pain. EXAM: PORTABLE CHEST - 1 VIEW COMPARISON:  08/18/2024 FINDINGS: No focal airspace consolidation, pleural effusion, or pneumothorax. No cardiomegaly.No acute fracture or destructive lesion. IMPRESSION: No acute cardiopulmonary abnormality. Electronically Signed   By: Rogelia Myers M.D.   On: 09/03/2024 17:01    {Document cardiac monitor, telemetry assessment procedure when appropriate:32947} Procedures   Medications Ordered in the ED  ipratropium-albuterol  (DUONEB) 0.5-2.5 (3) MG/3ML nebulizer solution 3 mL (3 mLs Nebulization Given 09/03/24 1727)  albuterol  (PROVENTIL ) (2.5 MG/3ML) 0.083% nebulizer solution 2.5 mg (2.5 mg Nebulization Given 09/03/24 1727)  methylPREDNISolone  sodium succinate (SOLU-MEDROL ) 125 mg/2 mL injection 125 mg (125 mg Intravenous Given 09/03/24 1732)  magnesium  sulfate IVPB 2 g 50 mL (2 g Intravenous New Bag/Given 09/03/24 1737)  HYDROmorphone  (DILAUDID ) injection 0.5 mg (0.5 mg Intravenous Given 09/03/24 1803)  ipratropium-albuterol  (DUONEB) 0.5-2.5 (3) MG/3ML nebulizer solution 3 mL (3 mLs Nebulization Given 09/03/24 1855)  albuterol  (PROVENTIL ) (2.5 MG/3ML) 0.083% nebulizer solution 2.5 mg (2.5 mg Nebulization Given 09/03/24 1856)     CRITICAL CARE Performed by: Fairy Sermon Total critical care time: 45 minutes Critical care time was exclusive of separately billable procedures and treating other patients. Critical care was necessary to treat or prevent imminent or life-threatening  deterioration. Critical care was time spent personally by me on the following activities: development of treatment plan with patient and/or surrogate as well as nursing, discussions with consultants, evaluation of patient's response to treatment, examination of patient, obtaining history from patient or surrogate, ordering and performing treatments and interventions, ordering and review of laboratory studies, ordering and review of radiographic studies, pulse oximetry and re-evaluation of patient's condition.  {Click here for ABCD2, HEART and other calculators REFRESH Note before signing:1}                              Medical Decision Making Amount and/or Complexity of Data Reviewed Labs: ordered. Radiology: ordered.  Risk Prescription drug management. Decision regarding hospitalization.   Patient will be admitted for COPD exacerbation  {Document critical care time when appropriate  Document review of labs and clinical decision tools ie CHADS2VASC2, etc  Document your independent review of radiology images and any outside records  Document your discussion with family members, caretakers and with consultants  Document social determinants of health affecting pt's care  Document your decision making why or why not admission, treatments were needed:32947:::1}   Final diagnoses:  COPD exacerbation [facility])    ED Discharge Orders     None

## 2024-09-03 NOTE — ED Triage Notes (Signed)
 PT reports SHOB and chest pain x 2 days. Pt reports d/c from hospital last week for same.

## 2024-09-04 ENCOUNTER — Ambulatory Visit

## 2024-09-04 DIAGNOSIS — F319 Bipolar disorder, unspecified: Secondary | ICD-10-CM | POA: Diagnosis present

## 2024-09-04 DIAGNOSIS — J439 Emphysema, unspecified: Secondary | ICD-10-CM | POA: Diagnosis present

## 2024-09-04 DIAGNOSIS — Z885 Allergy status to narcotic agent status: Secondary | ICD-10-CM | POA: Diagnosis not present

## 2024-09-04 DIAGNOSIS — G4730 Sleep apnea, unspecified: Secondary | ICD-10-CM | POA: Diagnosis present

## 2024-09-04 DIAGNOSIS — J441 Chronic obstructive pulmonary disease with (acute) exacerbation: Secondary | ICD-10-CM | POA: Diagnosis present

## 2024-09-04 DIAGNOSIS — F1910 Other psychoactive substance abuse, uncomplicated: Secondary | ICD-10-CM

## 2024-09-04 DIAGNOSIS — F17210 Nicotine dependence, cigarettes, uncomplicated: Secondary | ICD-10-CM | POA: Diagnosis present

## 2024-09-04 DIAGNOSIS — J45901 Unspecified asthma with (acute) exacerbation: Secondary | ICD-10-CM | POA: Diagnosis present

## 2024-09-04 DIAGNOSIS — F419 Anxiety disorder, unspecified: Secondary | ICD-10-CM | POA: Diagnosis present

## 2024-09-04 DIAGNOSIS — Z91030 Bee allergy status: Secondary | ICD-10-CM | POA: Diagnosis not present

## 2024-09-04 DIAGNOSIS — G4733 Obstructive sleep apnea (adult) (pediatric): Secondary | ICD-10-CM | POA: Diagnosis present

## 2024-09-04 DIAGNOSIS — Z91018 Allergy to other foods: Secondary | ICD-10-CM | POA: Diagnosis not present

## 2024-09-04 DIAGNOSIS — K219 Gastro-esophageal reflux disease without esophagitis: Secondary | ICD-10-CM | POA: Diagnosis present

## 2024-09-04 DIAGNOSIS — N950 Postmenopausal bleeding: Secondary | ICD-10-CM | POA: Diagnosis present

## 2024-09-04 DIAGNOSIS — Z8249 Family history of ischemic heart disease and other diseases of the circulatory system: Secondary | ICD-10-CM | POA: Diagnosis not present

## 2024-09-04 DIAGNOSIS — E785 Hyperlipidemia, unspecified: Secondary | ICD-10-CM | POA: Diagnosis present

## 2024-09-04 DIAGNOSIS — R739 Hyperglycemia, unspecified: Secondary | ICD-10-CM | POA: Diagnosis not present

## 2024-09-04 DIAGNOSIS — D649 Anemia, unspecified: Secondary | ICD-10-CM | POA: Diagnosis present

## 2024-09-04 DIAGNOSIS — Z888 Allergy status to other drugs, medicaments and biological substances status: Secondary | ICD-10-CM | POA: Diagnosis not present

## 2024-09-04 DIAGNOSIS — Z860101 Personal history of adenomatous and serrated colon polyps: Secondary | ICD-10-CM | POA: Diagnosis not present

## 2024-09-04 DIAGNOSIS — F1210 Cannabis abuse, uncomplicated: Secondary | ICD-10-CM | POA: Diagnosis present

## 2024-09-04 DIAGNOSIS — K1120 Sialoadenitis, unspecified: Secondary | ICD-10-CM | POA: Diagnosis present

## 2024-09-04 DIAGNOSIS — F1410 Cocaine abuse, uncomplicated: Secondary | ICD-10-CM | POA: Diagnosis present

## 2024-09-04 DIAGNOSIS — T380X5A Adverse effect of glucocorticoids and synthetic analogues, initial encounter: Secondary | ICD-10-CM | POA: Diagnosis not present

## 2024-09-04 LAB — CBC
HCT: 38.9 % (ref 36.0–46.0)
Hemoglobin: 11.9 g/dL — ABNORMAL LOW (ref 12.0–15.0)
MCH: 27.4 pg (ref 26.0–34.0)
MCHC: 30.6 g/dL (ref 30.0–36.0)
MCV: 89.6 fL (ref 80.0–100.0)
Platelets: 303 K/uL (ref 150–400)
RBC: 4.34 MIL/uL (ref 3.87–5.11)
RDW: 13.2 % (ref 11.5–15.5)
WBC: 8.5 K/uL (ref 4.0–10.5)
nRBC: 0 % (ref 0.0–0.2)

## 2024-09-04 LAB — COMPREHENSIVE METABOLIC PANEL WITH GFR
ALT: 27 U/L (ref 0–44)
AST: 21 U/L (ref 15–41)
Albumin: 3.4 g/dL — ABNORMAL LOW (ref 3.5–5.0)
Alkaline Phosphatase: 100 U/L (ref 38–126)
Anion gap: 13 (ref 5–15)
BUN: 17 mg/dL (ref 8–23)
CO2: 21 mmol/L — ABNORMAL LOW (ref 22–32)
Calcium: 8.8 mg/dL — ABNORMAL LOW (ref 8.9–10.3)
Chloride: 104 mmol/L (ref 98–111)
Creatinine, Ser: 0.94 mg/dL (ref 0.44–1.00)
GFR, Estimated: >60 mL/min (ref >60)
Glucose, Bld: 206 mg/dL — ABNORMAL HIGH (ref 70–99)
Potassium: 3.8 mmol/L (ref 3.5–5.1)
Sodium: 137 mmol/L (ref 135–145)
Total Bilirubin: <0.2 mg/dL (ref 0.0–1.2)
Total Protein: 5.7 g/dL — ABNORMAL LOW (ref 6.5–8.1)

## 2024-09-04 LAB — HEMOGLOBIN A1C
Hgb A1c MFr Bld: 4.9 % (ref 4.8–5.6)
Mean Plasma Glucose: 93.93 mg/dL

## 2024-09-04 MED ORDER — GUAIFENESIN-DM 100-10 MG/5ML PO SYRP: 10.0000 mL | ORAL_SOLUTION | ORAL | Status: DC | PRN

## 2024-09-04 MED ORDER — IPRATROPIUM-ALBUTEROL 0.5-2.5 (3) MG/3ML IN SOLN
3.0000 mL | RESPIRATORY_TRACT | Status: DC | PRN
  Administered 2024-09-04 (×2): 3 mL via RESPIRATORY_TRACT
  Filled 2024-09-04 (×2): qty 3

## 2024-09-04 MED ORDER — HYDROXYZINE HCL 25 MG PO TABS
25.0000 mg | ORAL_TABLET | Freq: Three times a day (TID) | ORAL | Status: DC | PRN
  Administered 2024-09-04 – 2024-09-05 (×2): 25 mg via ORAL
  Filled 2024-09-04 (×3): qty 1

## 2024-09-04 MED ORDER — METHYLPREDNISOLONE SODIUM SUCC 40 MG IJ SOLR
40.0000 mg | Freq: Two times a day (BID) | INTRAMUSCULAR | Status: DC
  Administered 2024-09-04 – 2024-09-06 (×5): 40 mg via INTRAVENOUS
  Filled 2024-09-04 (×5): qty 1

## 2024-09-04 MED ORDER — LORATADINE 10 MG PO TABS
10.0000 mg | ORAL_TABLET | Freq: Every day | ORAL | Status: DC
  Administered 2024-09-04 – 2024-09-06 (×3): 10 mg via ORAL
  Filled 2024-09-04 (×3): qty 1

## 2024-09-04 NOTE — Plan of Care (Signed)

## 2024-09-04 NOTE — TOC Initial Note (Signed)
 Transition of Care [facility]) - Initial/Assessment Note    Patient Details  Name: Kathleen [name] MRN: [id number] Date of Birth: 1961-11-24  [facility] CM/SW Contact:    Doneta Glenys DASEN, RN Phone Number: 09/04/2024, 2:00 PM  Clinical Narrative:                 Presented for SOB. Previous admit 2 weeks prior for SOB. PTA states she lives with Suzen [phone] (niece) and her family in a house. Patient stating she would like resources for help with finding resources. Paper copies of information printed and added to AVS. Denies DME, HH, oxygen  or additional SDOH needs. Patient states she drove self to hospital and car is in parking lot. No additional needs identified during visit. Place consult if needs present.  Expected Discharge Plan: Home/Self Care Barriers to Discharge: Continued Medical Work up   Patient Goals and CMS Choice Patient states their goals for this hospitalization and ongoing recovery are:: To find my own place. CMS Medicare.gov Compare Post Acute Care list provided to::  (NA) Choice offered to / list presented to : NA [place] ownership interest in [facility].provided to:: Parent NA    Expected Discharge Plan and Services In-house Referral: NA Discharge Planning Services: CM Consult Post Acute Care Choice:  (NA) Living arrangements for the past 2 months: Single Family Home                 DME Arranged: N/A DME Agency: NA       HH Arranged: NA HH Agency: NA        Prior Living Arrangements/Services Living arrangements for the past 2 months: Single Family Home Lives with:: Relatives Patient language and need for interpreter reviewed:: Yes Do you feel safe going back to the place where you live?: Yes      Need for Family Participation in Patient Care: No (Comment) Care giver support system in place?: Yes (comment) Current home services:  (NA) Criminal Activity/Legal Involvement Pertinent to Current Situation/Hospitalization: No  - Comment as needed  Activities of Daily Living   ADL Screening (condition at time of admission) Independently performs ADLs?: Yes (appropriate for developmental age) Is the patient deaf or have difficulty hearing?: No Does the patient have difficulty seeing, even when wearing glasses/contacts?: Yes Does the patient have difficulty concentrating, remembering, or making decisions?: No  Permission Sought/Granted Permission sought to share information with : Case Manager    Share Information with NAME: Suzen (niece) [phone]           Emotional Assessment Appearance:: Appears stated age Attitude/Demeanor/Rapport: Engaged Affect (typically observed): Appropriate Orientation: : Oriented to Self, Oriented to Place, Oriented to  Time, Oriented to Situation Alcohol / Substance Use: Not Applicable Psych Involvement: No (comment)  Admission diagnosis:  COPD exacerbation (HCC) [J44.1] Patient Active Problem List   Diagnosis Date Noted   Post-menopause bleeding 08/22/2024   Respiratory distress secondary to COPD exacerbation 08/18/2024   left Metacarpal bone fracture 08/18/2024   Bipolar 1 disorder (HCC) 08/18/2024   Cocaine abuse (HCC) 06/15/2024   COPD with acute exacerbation (HCC) 06/14/2024   Urinary incontinence 03/07/2024   Cervical radiculopathy 05/22/2023   Chronic back pain 03/08/2023   OSA (obstructive sleep apnea) 03/08/2023   COPD exacerbation (HCC) 01/30/2023   Syncope and collapse 01/30/2023   Malnutrition of moderate degree 11/17/2022   Marijuana abuse 08/17/2022   Weight loss 07/07/2022   Sialoadenitis 03/16/2022   Skin growth 08/28/2021   Dysuria 08/28/2021  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   Daytime somnolence 04/06/2019   Lumbar radiculopathy 07/26/2016   Depression 09/19/2015   Healthcare maintenance 06/18/2014   Tobacco abuse 03/13/2013   GERD 01/21/2010   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, [place] - [address] [place] [address] Phone: [phone] Fax: [phone]     Social Drivers of Health (SDOH) Social History: SDOH Screenings   Food Insecurity: No Food Insecurity (09/03/2024)  Housing: Low Risk  (09/04/2024)  Recent Concern: Housing - High Risk (08/18/2024)  Transportation Needs: No Transportation Needs (09/03/2024)  Utilities: Not At Risk (09/03/2024)  Alcohol Screen: Low Risk  (05/19/2023)  Depression (PHQ2-9): High Risk (05/19/2023)  Financial Resource Strain: Medium Risk (05/19/2023)  Physical Activity: Sufficiently Active (05/19/2023)  Social Connections: Moderately Integrated (09/03/2024)  Stress: Stress Concern Present (05/19/2023)  Tobacco Use: High Risk (09/03/2024)   SDOH Interventions:     Readmission Risk Interventions    08/20/2024    4:08 PM 04/08/2023    2:51 PM 03/08/2023    4:05 PM  Readmission Risk Prevention Plan  Transportation Screening Complete  Complete  PCP or Specialist Appt within 3-5 Days Complete  Complete  HRI or Home Care Consult Complete  Complete  Social Work Consult for Recovery Care Planning/Counseling Complete  Complete  Palliative Care Screening Not Applicable  Not Applicable  Medication Review Oceanographer) Complete  Complete  PCP or Specialist appointment within 3-5 days of discharge     HRI or Home Care Consult     SW Recovery Care/Counseling Consult     Palliative Care Screening     Skilled Nursing Facility        Information is confidential and restricted. Go to Review Flowsheets to unlock data.

## 2024-09-04 NOTE — Progress Notes (Signed)
 PROGRESS NOTE   Kathleen [name]  FMW:[id number]    DOB: 22-Mar-1961    DOA: 09/03/2024  PCP: Renne Homans, MD   I have briefly reviewed patients previous medical records in [facility].   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  Has not been back to work for 2 weeks and so no occupational exposure.  Has not smoked since last discharge.  She did some cocaine shortly after last discharge but nothing in the last 7 days.  Admitted for COPD/asthma exacerbation.   Assessment & Plan:   COPD/asthma exacerbation Transiently tachypneic on arrival which has since resolved. Possibly an infectious precipitant. Chest x-ray negative. After dose of IV Solu-Medrol  125 mg x 1 on day of admission, had been switched to oral prednisone  beginning today and got a dose this morning.  However patient with ongoing significant wheezing and dyspnea on minimal exertion. Therefore switched back to IV Solu-Medrol  40 mg 12 hours, continue Pulmicort  and Brovana  nebulizations, DuoNeb every 4 hourly as needed, complete course of empiric azithromycin .  Added flutter valve.  Continue incentive spirometry. Recommend outpatient follow-up with primary Pulmonologist Dr. Annella  Polysubstance abuse (tobacco, cocaine, THC) Continue nicotine  patch. UDS 08/18/2024: Positive for cocaine.  Anxiety/depression Continue as needed Atarax   Anemia Follow CBC in AM.  Seasonal allergies Continue  Claritin   Hyperglycemia, DM ruled out A1c 9/23: 4.9 Likely related to steroids.  GERD HLD OSA Not on meds for above and does not use CPAP.  There is no height or weight on file to calculate BMI.   DVT prophylaxis: enoxaparin  (LOVENOX ) injection 40 mg Start: 09/04/24 1000     Code Status: Full Code:  Family Communication: None at bedside Disposition:  Status is: Observation The patient will require care spanning > 2 midnights and should be moved to inpatient because: Ongoing COPD/asthma exacerbation, IV meds and close monitoring.     Consultants:   None  Procedures:     Subjective:  Reports cough with green sputum.  Ongoing dyspnea/wheezing with ambulating even to the bathroom.  No chest pain.  Reports that she has an upcoming appointment at [facility] on 9/25 for stone removal from neck  Objective:   Vitals:   09/04/24 0905 09/04/24 0906 09/04/24 0952 09/04/24 1209  BP:   119/67 121/89  Pulse:   76 88  Resp:   17 16  Temp:   97.7 F (36.5 C) 98.3 F (36.8 C)  TempSrc:      SpO2: 93% 93%  98%    General exam: Middle-age female, moderately built and nourished sitting up in bed in no obvious distress but does have dyspnea on speaking. Respiratory system: Harsh and reduced breath sounds bilaterally with scattered bilateral few medium pitched expiratory rhonchi.  No crackles.  Some dyspnea while speaking.  Accessory muscles not active.  No overt respiratory distress. Cardiovascular system: S1 & S2 heard, RRR. No JVD, murmurs, rubs, gallops or clicks. No pedal edema. Gastrointestinal system: Abdomen is nondistended, soft and nontender. No organomegaly or masses  felt. Normal bowel sounds heard. Central nervous system: Alert and oriented. No focal neurological deficits. Extremities: Symmetric 5 x 5 power. Skin: No rashes, lesions or ulcers Psychiatry: Judgement and insight appear normal. Mood & affect appropriate.     Data Reviewed:   I have personally reviewed following  labs and imaging studies   CBC: Recent Labs  Lab 09/03/24 1738 09/04/24 0430  WBC 8.4 8.5  HGB 13.4 11.9*  HCT 42.0 38.9  MCV 88.2 89.6  PLT 319 303    Basic Metabolic Panel: Recent Labs  Lab 09/03/24 1738 09/04/24 0430  NA 142 137  K 3.7 3.8  CL 107 104  CO2 26 21*  GLUCOSE 82 206*  BUN 11 17  CREATININE 0.97 0.94  CALCIUM  9.0 8.8*    Liver Function Tests: Recent Labs  Lab 09/04/24 0430  AST 21  ALT 27  ALKPHOS 100  BILITOT <0.2  PROT 5.7*  ALBUMIN 3.4*    CBG: No results for input(s): GLUCAP in the last 168 hours.  Microbiology Studies:  No results found for this or any previous visit (from the past 240 hours).  Radiology Studies:  DG Chest Port 1 View Result Date: 09/03/2024 CLINICAL DATA:  soband chest pain. EXAM: PORTABLE CHEST - 1 VIEW COMPARISON:  08/18/2024 FINDINGS: No focal airspace consolidation, pleural effusion, or pneumothorax. No cardiomegaly.No acute fracture or destructive lesion. IMPRESSION: No acute cardiopulmonary abnormality. Electronically Signed   By: Rogelia Myers M.D.   On: 09/03/2024 17:01    Scheduled Meds:    arformoterol   15 mcg Nebulization BID   azithromycin   500 mg Oral QHS   budesonide  (PULMICORT ) nebulizer solution  0.25 mg Nebulization BID   enoxaparin  (LOVENOX ) injection  40 mg Subcutaneous Q24H   loratadine   10 mg Oral Daily   methylPREDNISolone  (SOLU-MEDROL ) injection  40 mg Intravenous Q12H   nicotine   14 mg Transdermal Daily    Continuous Infusions:     LOS: 0 days     Trenda Mar, MD,  FACP, [facility], [facility], [facility]   [facility] [place]      To contact the attending provider between 7A-7P or the covering provider during after hours 7P-7A, please log into the web site [email or web address] and access using universal [place] password for that web site. If you do not have the password, please call the hospital operator.  09/04/2024, 1:17 PM

## 2024-09-04 NOTE — Discharge Instructions (Signed)
 HOUSING RESOURCES [employer] Transitional Housing   [phone] [employer] Public Housing Program   [phone] *Section 8 Housing Choice Vouchers [phone] [facility] Resource Counseling   [phone] GET HELP [phone] Need assistance? Not sure where to start? No matter where you live in [place] , you can call 2-1-1 and a trained 2-1-1 agent will help you to find available human services resources in your community.  If you need help finding assistance with housing, food, healthcare, utility payments, and more, we can help. 2-1-1 Referral Specialists are available 24 hours a day, every day by dialing 2-1-1 or [phone] from any phone. The call is free, confidential, and available in any language.  online 2-1-1 search tool, log on [email or web address]

## 2024-09-05 DIAGNOSIS — J441 Chronic obstructive pulmonary disease with (acute) exacerbation: Secondary | ICD-10-CM | POA: Diagnosis not present

## 2024-09-05 LAB — CBC
HCT: 38.7 % (ref 36.0–46.0)
Hemoglobin: 11.9 g/dL — ABNORMAL LOW (ref 12.0–15.0)
MCH: 27.5 pg (ref 26.0–34.0)
MCHC: 30.7 g/dL (ref 30.0–36.0)
MCV: 89.6 fL (ref 80.0–100.0)
Platelets: 308 K/uL (ref 150–400)
RBC: 4.32 MIL/uL (ref 3.87–5.11)
RDW: 13.3 % (ref 11.5–15.5)
WBC: 16.6 K/uL — ABNORMAL HIGH (ref 4.0–10.5)
nRBC: 0 % (ref 0.0–0.2)

## 2024-09-05 MED ORDER — ALBUTEROL SULFATE (2.5 MG/3ML) 0.083% IN NEBU: 2.5000 mg | INHALATION_SOLUTION | RESPIRATORY_TRACT | Status: DC | PRN

## 2024-09-05 MED ORDER — FAMOTIDINE 20 MG PO TABS
20.0000 mg | ORAL_TABLET | Freq: Two times a day (BID) | ORAL | Status: DC | PRN
  Administered 2024-09-05: 20 mg via ORAL
  Filled 2024-09-05: qty 1

## 2024-09-05 MED ORDER — IPRATROPIUM-ALBUTEROL 0.5-2.5 (3) MG/3ML IN SOLN
3.0000 mL | Freq: Four times a day (QID) | RESPIRATORY_TRACT | Status: DC
  Administered 2024-09-05 – 2024-09-06 (×4): 3 mL via RESPIRATORY_TRACT
  Filled 2024-09-05 (×4): qty 3

## 2024-09-05 NOTE — TOC Progression Note (Signed)
 Transition of Care [facility]) - Progression Note    Patient Details  Name: Kathleen [name] MRN: [id number] Date of Birth: [birth year]  Transition of [facility]) CM/SW Contact  Doneta Glenys DASEN, RN Phone Number: 09/05/2024, 2:57 PM  Clinical Narrative:    CM has provided the patient with additional resources for housing. Both paper and to the AVS.   Expected Discharge Plan: Home/Self Care Barriers to Discharge: Continued Medical Work up               Expected Discharge Plan and Services In-house Referral: NA Discharge Planning Services: CM Consult Post Acute Care Choice:  (NA) Living arrangements for the past 2 months: Single Family Home                 DME Arranged: N/A DME Agency: NA       HH Arranged: NA HH Agency: NA         Social Drivers of Health (SDOH) Interventions SDOH Screenings   Food Insecurity: No Food Insecurity (09/03/2024)  Housing: Low Risk  (09/04/2024)  Recent Concern: Housing - High Risk (08/18/2024)  Transportation Needs: No Transportation Needs (09/03/2024)  Utilities: Not At Risk (09/03/2024)  Alcohol Screen: Low Risk  (05/19/2023)  Depression (PHQ2-9): High Risk (05/19/2023)  Financial Resource Strain: Medium Risk (05/19/2023)  Physical Activity: Sufficiently Active (05/19/2023)  Social Connections: Moderately Integrated (09/03/2024)  Stress: Stress Concern Present (05/19/2023)  Tobacco Use: High Risk (09/03/2024)    Readmission Risk Interventions    08/20/2024    4:08 PM 04/08/2023    2:51 PM 03/08/2023    4:05 PM  Readmission Risk Prevention Plan  Transportation Screening Complete  Complete  PCP or Specialist Appt within 3-5 Days Complete  Complete  HRI or Home Care Consult Complete  Complete  Social Work Consult for Recovery Care Planning/Counseling Complete  Complete  Palliative Care Screening Not Applicable  Not Applicable  Medication Review Oceanographer) Complete  Complete  PCP or Specialist appointment within 3-5 days of discharge     HRI  or Home Care Consult     SW Recovery Care/Counseling Consult     Palliative Care Screening     Skilled Nursing Facility        Information is confidential and restricted. Go to Review Flowsheets to unlock data.

## 2024-09-05 NOTE — Plan of Care (Signed)
   Problem: Activity: Goal: Risk for activity intolerance will decrease Outcome: Progressing   Problem: Coping: Goal: Level of anxiety will decrease Outcome: Progressing   Problem: Pain Managment: Goal: General experience of comfort will improve and/or be controlled Outcome: Progressing

## 2024-09-05 NOTE — Progress Notes (Addendum)
 Progress Note   Patient: Kathleen [name] FMW:[id number] DOB: 1961/04/17 DOA: 09/03/2024     1 DOS: the patient was seen and examined on 09/05/2024   Brief hospital course: 63yo with h/o COPD/asthma overlap syndrome, tobacco use, anxiety/depression, HLD, OSA not on CPAP, and polysubstance abuse (cocaine and THC) who presented on 9/22 with worsening cough and SOB.  She was previously hospitalized from 9/6-10 with COPD exacerbation.  She has not worked or smoked since last discharge.  She was treated with IV steroids and nebulizer treatments with improvement.  Assessment and Plan:  COPD/asthma exacerbation Transiently tachypneic on arrival which has since resolved Chest x-ray negative Treated with IV Solu-Medrol  -> prednisone  Continue Pulmicort  and Brovana  nebulizations Change DuoNebs to standing q6h with prn Albuterol  q2h Complete course of empiric azithromycin  Added flutter valve, incentive spirometry Continue Robitussin DM Recommend outpatient follow-up with primary Pulmonologist Dr. Annella   Tobacco use d/o Continue nicotine  patch Reports no smoking since last hospitalization Needs absolute cessation   Polysubstance abuse (tobacco, cocaine, THC) Went to rehab in August in [place]  UDS 08/18/2024: Positive for cocaine Patient is aware that she needs to quit   Anxiety/depression Continue as needed hydroxyzine    Anemia Normcytic, likely chronic disease Stable   Seasonal allergies Continue Claritin    Hyperglycemia, DM ruled out A1c 9/23: 4.9 Likely related to steroids   Sialoadenitis Has office visit scheduled at [facility] on 9/25 Offered discharge today but she prefers to reschedule this appointment   GERD HLD OSA Not on meds for above and does not use CPAP  Postmenopausal bleeding Unremarkable recent US  Needs outpatient GYN follow up with [facility] as soon as possible          Consultants: TOC team   Procedures: None   Antibiotics: None   30 Day  Unplanned Readmission Risk Score    Flowsheet Row ED to Hosp-Admission (Current) from 09/03/2024 in [facility]  30 Day Unplanned Readmission Risk Score (%) 27.04 Filed at 09/05/2024 1600    This score is the patient's risk of an unplanned readmission within 30 days of being discharged (0 -100%). The score is based on dignosis, age, lab data, medications, orders, and past utilization.   Low:  0-14.9   Medium: 15-21.9   High: 22-29.9   Extreme: 30 and above           Subjective: She was hoping to make her [facility] appointment but would rather stay hospitalized.  She feels like she was discharged too early with last hospitalization and does not feel ready to go home today.   Objective: Vitals:   09/05/24 1223 09/05/24 1537  BP: 124/89   Pulse: 90   Resp: 16   Temp: 98.3 F (36.8 C)   SpO2:  99%    Intake/Output Summary (Last 24 hours) at 09/05/2024 1750 Last data filed at 09/05/2024 0900 Gross per 24 hour  Intake 240 ml  Output --  Net 240 ml   There were no vitals filed for this visit.  Exam:  General:  Appears calm and comfortable and is in NAD, wearing [place] O2 for comfort only Eyes:  normal lids, iris ENT:  grossly normal hearing, lips & tongue, mmm Cardiovascular:  RRR. No LE edema.  Respiratory:   Scattered wheezes, reasonable air movement.  Normal respiratory effort on [place] O2. Abdomen:  soft, NT, ND Skin:  no rash or induration seen on limited exam Musculoskeletal:  grossly normal tone BUE/BLE, good ROM, no bony  abnormality Psychiatric:  blunted mood and affect, speech fluent and appropriate, AOx3 Neurologic:  CN 2-12 grossly intact, moves all extremities in coordinated fashion  Data Reviewed: I have reviewed the patient's lab results since admission.  Pertinent labs for today include:   WBC 16.6 (steroids) Hgb 11.9, stable A1c 4.9     Family Communication: None present      Code Status: Full  Code   Disposition: Status is: Inpatient Remains inpatient appropriate because: ongoing management     Time spent: 50 minutes  Unresulted Labs (From admission, onward)     Start     Ordered   09/06/24 0500  CBC with Differential/Platelet  Tomorrow morning,   R        09/05/24 1750   09/06/24 0500  Basic metabolic panel with GFR  Tomorrow morning,   R        09/05/24 1750             Author: Delon Herald, MD 09/05/2024 5:50 PM  For on call review [email or web address].

## 2024-09-05 NOTE — Hospital Course (Signed)
 62yo with h/o COPD/asthma overlap syndrome, tobacco use, anxiety/depression, HLD, OSA not on CPAP, and polysubstance abuse (cocaine and THC) who presented on 9/22 with worsening cough and SOB.  She was previously hospitalized from 9/6-10 with COPD exacerbation.  She has not worked or smoked since last discharge.  She was treated with IV steroids and nebulizer treatments with improvement.

## 2024-09-05 NOTE — Plan of Care (Signed)

## 2024-09-06 DIAGNOSIS — J441 Chronic obstructive pulmonary disease with (acute) exacerbation: Secondary | ICD-10-CM | POA: Diagnosis not present

## 2024-09-06 LAB — CBC WITH DIFFERENTIAL/PLATELET
Abs Immature Granulocytes: 0.1 K/uL — ABNORMAL HIGH (ref 0.00–0.07)
Basophils Absolute: 0 K/uL (ref 0.0–0.1)
Basophils Relative: 0 %
Eosinophils Absolute: 0 K/uL (ref 0.0–0.5)
Eosinophils Relative: 0 %
HCT: 43 % (ref 36.0–46.0)
Hemoglobin: 12.9 g/dL (ref 12.0–15.0)
Immature Granulocytes: 1 %
Lymphocytes Relative: 6 %
Lymphs Abs: 1 K/uL (ref 0.7–4.0)
MCH: 27 pg (ref 26.0–34.0)
MCHC: 30 g/dL (ref 30.0–36.0)
MCV: 90 fL (ref 80.0–100.0)
Monocytes Absolute: 0.5 K/uL (ref 0.1–1.0)
Monocytes Relative: 3 %
Neutro Abs: 14.8 K/uL — ABNORMAL HIGH (ref 1.7–7.7)
Neutrophils Relative %: 90 %
Platelets: 326 K/uL (ref 150–400)
RBC: 4.78 MIL/uL (ref 3.87–5.11)
RDW: 13.7 % (ref 11.5–15.5)
WBC: 16.4 K/uL — ABNORMAL HIGH (ref 4.0–10.5)
nRBC: 0 % (ref 0.0–0.2)

## 2024-09-06 LAB — BASIC METABOLIC PANEL WITH GFR
Anion gap: 10 (ref 5–15)
BUN: 17 mg/dL (ref 8–23)
CO2: 26 mmol/L (ref 22–32)
Calcium: 9.1 mg/dL (ref 8.9–10.3)
Chloride: 103 mmol/L (ref 98–111)
Creatinine, Ser: 0.84 mg/dL (ref 0.44–1.00)
GFR, Estimated: >60 mL/min (ref >60)
Glucose, Bld: 120 mg/dL — ABNORMAL HIGH (ref 70–99)
Potassium: 4.2 mmol/L (ref 3.5–5.1)
Sodium: 139 mmol/L (ref 135–145)

## 2024-09-06 MED ORDER — NICOTINE 14 MG/24HR TD PT24: 14.0000 mg | MEDICATED_PATCH | Freq: Every day | TRANSDERMAL | 0 refills | Status: DC

## 2024-09-06 MED ORDER — ARFORMOTEROL TARTRATE 15 MCG/2ML IN NEBU: 15.0000 ug | INHALATION_SOLUTION | Freq: Two times a day (BID) | RESPIRATORY_TRACT | 0 refills | Status: DC

## 2024-09-06 MED ORDER — BUDESONIDE 0.25 MG/2ML IN SUSP: 0.2500 mg | Freq: Two times a day (BID) | RESPIRATORY_TRACT | 12 refills | Status: DC

## 2024-09-06 MED ORDER — PREDNISONE 10 MG PO TABS: ORAL_TABLET | ORAL | 0 refills | Status: AC

## 2024-09-06 NOTE — Plan of Care (Signed)

## 2024-09-06 NOTE — Discharge Summary (Signed)
 Physician Discharge Summary   Patient: Kathleen [name] MRN: [id number] DOB: [birth year]  Admit date:     09/03/2024  Discharge date: 09/06/24  Discharge Physician: Delon Herald   PCP: Renne Homans, MD   Recommendations at discharge:   Do not resume smoking; nicotine  patch prescribed Do not use cocaine Take prednisone  taper as prescribed Add Brovana  (aformoterol) and Pulmicort  (budesonide ) breathing treatments twice a day Follow up with Dr. Annella; referral placed Follow up with Dr. Amoako in 1-2 weeks; call for an appointment Follow up with GYN for an endometrial biopsy; referral placed Follow up with [facility] for stone removal  Discharge Diagnoses: Principal Problem:   COPD exacerbation (HCC) Active Problems:   Post-menopause bleeding   Cocaine abuse (HCC)   Tobacco abuse   Sialoadenitis    Hospital Course: 62yo with h/o COPD/asthma overlap syndrome, tobacco use, anxiety/depression, HLD, OSA not on CPAP, and polysubstance abuse (cocaine and THC) who presented on 9/22 with worsening cough and SOB.  She was previously hospitalized from 9/6-10 with COPD exacerbation.  She has not worked or smoked since last discharge.  She was treated with IV steroids and nebulizer treatments with improvement.  Assessment and Plan:  COPD/asthma exacerbation Transiently tachypneic on arrival which has since resolved Chest x-ray negative Treated with IV Solu-Medrol  -> prednisone  taper Continue Pulmicort  and Brovana  nebulizations Change DuoNebs to standing q6h with prn Albuterol  q2h Completed course of empiric azithromycin  Added flutter valve, incentive spirometry Continue Robitussin DM Recommend outpatient follow-up with primary Pulmonologist Dr. Annella   Tobacco use d/o Continue nicotine  patch Reports no smoking since last hospitalization Needs absolute cessation   Polysubstance abuse (tobacco, cocaine, THC) Went to rehab in August in [place]  UDS 08/18/2024: Positive for  cocaine Patient is aware that she needs to quit   Anxiety/depression Continue as needed hydroxyzine    Anemia Normcytic, likely chronic disease Stable   Seasonal allergies Continue Claritin    Hyperglycemia, DM ruled out A1c 9/23: 4.9 Likely related to steroids   Sialoadenitis Has office visit scheduled at [facility] on 9/25 Offered discharge today but she prefers to reschedule this appointment   GERD HLD OSA Not on meds for above and does not use CPAP   Postmenopausal bleeding Unremarkable recent US  Needs outpatient GYN follow up with [facility] as soon as possible           Consultants: TOC team   Procedures: None   Antibiotics: Azithromycin  9/22-25    Pain control - [place]  Controlled Substance Reporting System database was reviewed. and patient was instructed, not to drive, operate heavy machinery, perform activities at heights, swimming or participation in water  activities or provide baby-sitting services while on Pain, Sleep and Anxiety Medications; until their outpatient Physician has advised to do so again. Also recommended to not to take more than prescribed Pain, Sleep and Anxiety Medications.   Disposition: Home Diet recommendation:  Regular diet DISCHARGE MEDICATION: Allergies as of 09/06/2024       Reactions   Bee Venom Anaphylaxis   Peach [prunus Persica] Anaphylaxis, Swelling, Other (See Comments)   Throat swells , breaks out   Topamax [topiramate] Other (See Comments)   Hallucinations    Tramadol Nausea Only        Medication List     STOP taking these medications    guaiFENesin -dextromethorphan  100-10 MG/5ML syrup Commonly known as: ROBITUSSIN DM       TAKE these medications    albuterol  (2.5 MG/3ML) 0.083% nebulizer solution Commonly known as: PROVENTIL  Take  3 mLs (2.5 mg total) by nebulization every 2 (two) hours as needed for wheezing.   arformoterol  15 MCG/2ML Nebu Commonly known as: BROVANA  Take 2 mLs (15 mcg total) by  nebulization 2 (two) times daily.   budesonide  0.25 MG/2ML nebulizer solution Commonly known as: PULMICORT  Take 2 mLs (0.25 mg total) by nebulization 2 (two) times daily.   hydrOXYzine  25 MG tablet Commonly known as: ATARAX  TAKE 1 TABLET BY MOUTH THREE TIMES DAILY AS NEEDED FOR ANXIETY What changed:  reasons to take this additional instructions   ipratropium-albuterol  0.5-2.5 (3) MG/3ML Soln Commonly known as: DUONEB Take 3 mLs by nebulization every 6 (six) hours.   loratadine  10 MG tablet Commonly known as: Claritin  Take 1 tablet (10 mg total) by mouth daily.   nicotine  14 mg/24hr patch Commonly known as: NICODERM CQ  - dosed in mg/24 hours Place 1 patch (14 mg total) onto the skin daily. Start taking on: September 07, 2024   ondansetron  4 MG tablet Commonly known as: ZOFRAN  TAKE 1 TABLET BY MOUTH ONCE DAILY AS NEEDED FOR NAUSEA FOR VOMITING What changed: See the new instructions.   predniSONE  10 MG tablet Commonly known as: DELTASONE  Take 4 tablets (40 mg total) by mouth daily for 3 days, THEN 3 tablets (30 mg total) daily for 3 days, THEN 2 tablets (20 mg total) daily for 3 days, THEN 1 tablet (10 mg total) daily for 3 days. Start taking on: September 06, 2024        Discharge Exam:   Subjective: Feeling some better today.  Concerned about vaginal bleeding.  A little woozy this AM, more tired today.   Objective: Vitals:   09/06/24 0211 09/06/24 0536  BP:  129/84  Pulse:  62  Resp:  18  Temp:  98.4 F (36.9 C)  SpO2: 100%    No intake or output data in the 24 hours ending 09/06/24 1202  Filed Weights   09/05/24 2133  Weight: 65.4 kg    Exam:  General:  Appears calm and comfortable and is in NAD, wearing [place] O2 for comfort only Eyes:  normal lids, iris ENT:  grossly normal hearing, lips & tongue, mmm Cardiovascular:  RRR. No LE edema.  Respiratory:   Scattered wheezes, reasonable air movement.  Normal respiratory effort on  O2. Abdomen:  soft,  NT, ND Skin:  no rash or induration seen on limited exam Musculoskeletal:  grossly normal tone BUE/BLE, good ROM, no bony abnormality Psychiatric:  blunted mood and affect, speech fluent and appropriate, AOx3 Neurologic:  CN 2-12 grossly intact, moves all extremities in coordinated fashion  Data Reviewed: I have reviewed the patient's lab results since admission.  Pertinent labs for today include:   Normal BMP other than glucose 120 WBC 16.4 (steroids)    Condition at discharge: stable  The results of significant diagnostics from this hospitalization (including imaging, microbiology, ancillary and laboratory) are listed below for reference.   Imaging Studies: DG Chest Port 1 View Result Date: 09/03/2024 CLINICAL DATA:  soband chest pain. EXAM: PORTABLE CHEST - 1 VIEW COMPARISON:  08/18/2024 FINDINGS: No focal airspace consolidation, pleural effusion, or pneumothorax. No cardiomegaly.No acute fracture or destructive lesion. IMPRESSION: No acute cardiopulmonary abnormality. Electronically Signed   By: Rogelia Myers M.D.   On: 09/03/2024 17:01   US  PELVIC COMPLETE WITH TRANSVAGINAL Result Date: 08/21/2024 CLINICAL DATA:  Abnormal vaginal bleeding EXAM: TRANSABDOMINAL AND TRANSVAGINAL ULTRASOUND OF PELVIS TECHNIQUE: Both transabdominal and transvaginal ultrasound examinations of the pelvis were performed. Transabdominal technique  was performed for global imaging of the pelvis including uterus, ovaries, adnexal regions, and pelvic cul-de-sac. It was necessary to proceed with endovaginal exam following the transabdominal exam to visualize the endometrium and adnexal structures. COMPARISON:  07/30/2014 FINDINGS: Uterus Measurements: 6.6 x 3.6 x 3.6 cm = volume: 44.7 mL. No fibroids or other mass visualized. Endometrium Thickness: 5 mm.  No focal abnormality visualized. Right ovary Not visualized. Left ovary Not visualized. Other findings No pelvic free fluid or adnexal masses are visualized.  IMPRESSION: 1. Nonvisualization of the bilateral ovaries. 2. Unremarkable appearance of the uterus, with endometrium measuring 5 mm. In the setting of post-menopausal bleeding, endometrial sampling is indicated to exclude carcinoma. If results are benign, sonohysterogram should be considered for focal lesion work-up. (Ref: Radiological Reasoning: Algorithmic Workup of Abnormal Vaginal Bleeding with Endovaginal Sonography and Sonohysterography. AJR 2008; [id number]:D31-26) Electronically Signed   By: Ozell Daring M.D.   On: 08/21/2024 18:11   DG Chest 1 View Result Date: 08/18/2024 CLINICAL DATA:  Shortness of breath. EXAM: CHEST  1 VIEW COMPARISON:  June 14, 2024. FINDINGS: The heart size and mediastinal contours are within normal limits. Both lungs are clear. The visualized skeletal structures are unremarkable. IMPRESSION: No active disease. Electronically Signed   By: Lynwood Landy Raddle M.D.   On: 08/18/2024 13:27    Microbiology: Results for orders placed or performed during the hospital encounter of 08/18/24  Resp panel by RT-PCR (RSV, Flu A&B, Covid) Anterior Nasal Swab     Status: None   Collection Time: 08/18/24  1:17 PM   Specimen: Anterior Nasal Swab  Result Value Ref Range Status   SARS Coronavirus 2 by RT PCR NEGATIVE NEGATIVE Final    Comment: (NOTE) SARS-CoV-2 target nucleic acids are NOT DETECTED.  The SARS-CoV-2 RNA is generally detectable in upper respiratory specimens during the acute phase of infection. The lowest concentration of SARS-CoV-2 viral copies this assay can detect is 138 copies/mL. A negative result does not preclude SARS-Cov-2 infection and should not be used as the sole basis for treatment or other patient management decisions. A negative result may occur with  improper specimen collection/handling, submission of specimen other than nasopharyngeal swab, presence of viral mutation(s) within the areas targeted by this assay, and inadequate number of viral copies(<138  copies/mL). A negative result must be combined with clinical observations, patient history, and epidemiological information. The expected result is Negative.  Fact Sheet for Patients:  BloggerCourse.com  Fact Sheet for Healthcare Providers:  SeriousBroker.it  This test is no t yet approved or cleared by the [place]  FDA and  has been authorized for detection and/or diagnosis of SARS-CoV-2 by FDA under an Emergency Use Authorization (EUA). This EUA will remain  in effect (meaning this test can be used) for the duration of the COVID-19 declaration under Section 564(b)(1) of the Act, 21 U.S.C.section 360bbb-3(b)(1), unless the authorization is terminated  or revoked sooner.       Influenza A by PCR NEGATIVE NEGATIVE Final   Influenza B by PCR NEGATIVE NEGATIVE Final    Comment: (NOTE) The Xpert Xpress SARS-CoV-2/FLU/RSV plus assay is intended as an aid in the diagnosis of influenza from Nasopharyngeal swab specimens and should not be used as a sole basis for treatment. Nasal washings and aspirates are unacceptable for Xpert Xpress SARS-CoV-2/FLU/RSV testing.  Fact Sheet for Patients: BloggerCourse.com  Fact Sheet for Healthcare Providers: SeriousBroker.it  This test is not yet approved or cleared by the [place]  FDA and has been authorized for  detection and/or diagnosis of SARS-CoV-2 by FDA under an Emergency Use Authorization (EUA). This EUA will remain in effect (meaning this test can be used) for the duration of the COVID-19 declaration under Section 564(b)(1) of the Act, 21 U.S.C. section 360bbb-3(b)(1), unless the authorization is terminated or revoked.     Resp Syncytial Virus by PCR NEGATIVE NEGATIVE Final    Comment: (NOTE) Fact Sheet for Patients: BloggerCourse.com  Fact Sheet for Healthcare  Providers: SeriousBroker.it  This test is not yet approved or cleared by the [place]  FDA and has been authorized for detection and/or diagnosis of SARS-CoV-2 by FDA under an Emergency Use Authorization (EUA). This EUA will remain in effect (meaning this test can be used) for the duration of the COVID-19 declaration under Section 564(b)(1) of the Act, 21 U.S.C. section 360bbb-3(b)(1), unless the authorization is terminated or revoked.  Performed at [facility], [address], [place], [place] [address]   MRSA Next Gen by PCR, Nasal     Status: None   Collection Time: 08/18/24  6:21 PM   Specimen: Nasal Mucosa; Nasal Swab  Result Value Ref Range Status   MRSA by PCR Next Gen NOT DETECTED NOT DETECTED Final    Comment: (NOTE) The GeneXpert MRSA Assay (FDA approved for NASAL specimens only), is one component of a comprehensive MRSA colonization surveillance program. It is not intended to diagnose MRSA infection nor to guide or monitor treatment for MRSA infections. Test performance is not FDA approved in patients less than 95 years old. Performed at [facility], [address] [place], [place] [address]   Urine Culture (for pregnant, neutropenic or urologic patients or patients with an indwelling urinary catheter)     Status: Abnormal   Collection Time: 08/18/24  8:31 PM   Specimen: Urine, Clean Catch  Result Value Ref Range Status   Specimen Description   Final    URINE, CLEAN CATCH Performed at [facility], [address] [place], [place] [address]    Special Requests   Final    NONE Performed at [facility], [address] [place], [place] [address]    Culture MULTIPLE SPECIES PRESENT, SUGGEST RECOLLECTION (A)  Final   Report Status 08/20/2024 FINAL  Final    Labs: CBC: Recent Labs  Lab 09/03/24 1738 09/04/24 0430 09/05/24 0437 09/06/24 0428  WBC 8.4  8.5 16.6* 16.4*  NEUTROABS  --   --   --  14.8*  HGB 13.4 11.9* 11.9* 12.9  HCT 42.0 38.9 38.7 43.0  MCV 88.2 89.6 89.6 90.0  PLT 319 303 308 326   Basic Metabolic Panel: Recent Labs  Lab 09/03/24 1738 09/04/24 0430 09/06/24 0428  NA 142 137 139  K 3.7 3.8 4.2  CL 107 104 103  CO2 26 21* 26  GLUCOSE 82 206* 120*  BUN 11 17 17   CREATININE 0.97 0.94 0.84  CALCIUM  9.0 8.8* 9.1   Liver Function Tests: Recent Labs  Lab 09/04/24 0430  AST 21  ALT 27  ALKPHOS 100  BILITOT <0.2  PROT 5.7*  ALBUMIN 3.4*   CBG: No results for input(s): GLUCAP in the last 168 hours.  Discharge time spent: greater than 30 minutes.  Signed: Delon Herald, MD [facility] 09/06/2024

## 2024-09-06 NOTE — Plan of Care (Signed)
 Pt received, educated on, and understands AVS discharge summary.  All IV accesses discontinued.

## 2024-09-13 ENCOUNTER — Ambulatory Visit: Payer: Self-pay

## 2024-09-13 NOTE — Telephone Encounter (Signed)
 RTC to patient missed other appointments due to being in the hospital.  Would like to get a follow up appointment for being in the hospital and a Pap Smear done.  Scheduled for an appointment on 09/21/2024.

## 2024-09-13 NOTE — Telephone Encounter (Signed)
 FYI Only or Action Required?: Action required by provider: request for appointment.  Patient was last seen in primary care on 03/06/2024 by Rosan Dayton BROCKS, DO.  Called Nurse Triage reporting Vaginal Discharge.  Symptoms began yesterday.  Interventions attempted: Nothing.  Symptoms are: unchanged.Pt. Asking to be worked in for a vaginal PAP smear, please adc  Triage Disposition: See Physician Within 24 Hours  Patient/caregiver understands and will follow disposition?:    Copied from CRM #[id number]. Topic: Clinical - Red Word Triage >> Sep 13, 2024 12:17 PM Graeme ORN wrote: Red Word that prompted transfer to Nurse Triage: Recently in hospital - hurting and cramping very bad - yellow discharge - awful smell - hospital said contact pcp asap Answer Assessment - Initial Assessment Questions 1. SYMPTOM: What's the main symptom you're concerned about? (e.g., pain, itching, dryness)     Vaginal pain, discharge yellow 2. LOCATION: Where is the   located? (e.g., inside/outside, left/right)     inside 3. ONSET: When did the    start?     Last month 4. PAIN: Is there any pain? If Yes, ask: How bad is it? (Scale: 1-10; mild, moderate, severe)     moderate 5. ITCHING: Is there any itching? If Yes, ask: How bad is it? (Scale: 1-10; mild, moderate, severe)     no 6. CAUSE: What do you think is causing the discharge? Have you had the same problem before? What happened then?     unsure 7. OTHER SYMPTOMS: Do you have any other symptoms? (e.g., fever, itching, vaginal bleeding, pain with urination, injury to genital area, vaginal foreign body)     odor 8. PREGNANCY: Is there any chance you are pregnant? When was your last menstrual period?     no  Protocols used: Vaginal Symptoms-A-AH  Reason for Disposition  [1] MILD to MODERATE vaginal pain AND [2] present > 24 hours  (Exception: Chronic pain.)  Answer Assessment - Initial Assessment Questions 1. SYMPTOM: What's the  main symptom you're concerned about? (e.g., pain, itching, dryness)     Vaginal pain, discharge yellow 2. LOCATION: Where is the   located? (e.g., inside/outside, left/right)     inside 3. ONSET: When did the    start?     Last month 4. PAIN: Is there any pain? If Yes, ask: How bad is it? (Scale: 1-10; mild, moderate, severe)     moderate 5. ITCHING: Is there any itching? If Yes, ask: How bad is it? (Scale: 1-10; mild, moderate, severe)     no 6. CAUSE: What do you think is causing the discharge? Have you had the same problem before? What happened then?     unsure 7. OTHER SYMPTOMS: Do you have any other symptoms? (e.g., fever, itching, vaginal bleeding, pain with urination, injury to genital area, vaginal foreign body)     odor 8. PREGNANCY: Is there any chance you are pregnant? When was your last menstrual period?     no  Protocols used: Vaginal Symptoms-A-AH

## 2024-09-21 ENCOUNTER — Ambulatory Visit: Admitting: Student

## 2024-09-21 ENCOUNTER — Emergency Department (HOSPITAL_COMMUNITY): Payer: MEDICAID

## 2024-09-21 ENCOUNTER — Inpatient Hospital Stay (HOSPITAL_COMMUNITY)
Admission: EM | Admit: 2024-09-21 | Discharge: 2024-09-24 | DRG: 872 | Disposition: A | Discharge status: Home / Self Care | Payer: MEDICAID | Attending: Internal Medicine | Admitting: Internal Medicine

## 2024-09-21 DIAGNOSIS — B974 Respiratory syncytial virus as the cause of diseases classified elsewhere: Secondary | ICD-10-CM | POA: Diagnosis present

## 2024-09-21 DIAGNOSIS — T380X5A Adverse effect of glucocorticoids and synthetic analogues, initial encounter: Secondary | ICD-10-CM | POA: Diagnosis present

## 2024-09-21 DIAGNOSIS — F1990 Other psychoactive substance use, unspecified, uncomplicated: Secondary | ICD-10-CM | POA: Insufficient documentation

## 2024-09-21 DIAGNOSIS — Z7951 Long term (current) use of inhaled steroids: Secondary | ICD-10-CM

## 2024-09-21 DIAGNOSIS — Z720 Tobacco use: Secondary | ICD-10-CM | POA: Diagnosis present

## 2024-09-21 DIAGNOSIS — Z885 Allergy status to narcotic agent status: Secondary | ICD-10-CM

## 2024-09-21 DIAGNOSIS — F17210 Nicotine dependence, cigarettes, uncomplicated: Secondary | ICD-10-CM | POA: Diagnosis present

## 2024-09-21 DIAGNOSIS — Z803 Family history of malignant neoplasm of breast: Secondary | ICD-10-CM

## 2024-09-21 DIAGNOSIS — B9789 Other viral agents as the cause of diseases classified elsewhere: Secondary | ICD-10-CM | POA: Diagnosis present

## 2024-09-21 DIAGNOSIS — R0603 Acute respiratory distress: Secondary | ICD-10-CM | POA: Diagnosis present

## 2024-09-21 DIAGNOSIS — Z8042 Family history of malignant neoplasm of prostate: Secondary | ICD-10-CM

## 2024-09-21 DIAGNOSIS — B348 Other viral infections of unspecified site: Secondary | ICD-10-CM | POA: Diagnosis present

## 2024-09-21 DIAGNOSIS — Z1152 Encounter for screening for COVID-19: Secondary | ICD-10-CM

## 2024-09-21 DIAGNOSIS — Z860101 Personal history of adenomatous and serrated colon polyps: Secondary | ICD-10-CM

## 2024-09-21 DIAGNOSIS — N950 Postmenopausal bleeding: Secondary | ICD-10-CM | POA: Diagnosis present

## 2024-09-21 DIAGNOSIS — Z716 Tobacco abuse counseling: Secondary | ICD-10-CM

## 2024-09-21 DIAGNOSIS — R300 Dysuria: Secondary | ICD-10-CM | POA: Diagnosis present

## 2024-09-21 DIAGNOSIS — Z888 Allergy status to other drugs, medicaments and biological substances status: Secondary | ICD-10-CM

## 2024-09-21 DIAGNOSIS — K1120 Sialoadenitis, unspecified: Secondary | ICD-10-CM | POA: Diagnosis present

## 2024-09-21 DIAGNOSIS — Z91018 Allergy to other foods: Secondary | ICD-10-CM

## 2024-09-21 DIAGNOSIS — Z8419 Family history of other disorders of kidney and ureter: Secondary | ICD-10-CM

## 2024-09-21 DIAGNOSIS — F419 Anxiety disorder, unspecified: Secondary | ICD-10-CM | POA: Diagnosis present

## 2024-09-21 DIAGNOSIS — R1024 Suprapubic pain: Secondary | ICD-10-CM | POA: Diagnosis present

## 2024-09-21 DIAGNOSIS — F1410 Cocaine abuse, uncomplicated: Secondary | ICD-10-CM | POA: Diagnosis present

## 2024-09-21 DIAGNOSIS — Z833 Family history of diabetes mellitus: Secondary | ICD-10-CM

## 2024-09-21 DIAGNOSIS — G4733 Obstructive sleep apnea (adult) (pediatric): Secondary | ICD-10-CM | POA: Diagnosis present

## 2024-09-21 DIAGNOSIS — Z8249 Family history of ischemic heart disease and other diseases of the circulatory system: Secondary | ICD-10-CM

## 2024-09-21 DIAGNOSIS — A4189 Other specified sepsis: Principal | ICD-10-CM | POA: Diagnosis present

## 2024-09-21 DIAGNOSIS — Z91030 Bee allergy status: Secondary | ICD-10-CM

## 2024-09-21 DIAGNOSIS — J441 Chronic obstructive pulmonary disease with (acute) exacerbation: Secondary | ICD-10-CM | POA: Diagnosis not present

## 2024-09-21 DIAGNOSIS — E785 Hyperlipidemia, unspecified: Secondary | ICD-10-CM | POA: Diagnosis present

## 2024-09-21 DIAGNOSIS — R8281 Pyuria: Secondary | ICD-10-CM | POA: Diagnosis present

## 2024-09-21 DIAGNOSIS — J439 Emphysema, unspecified: Secondary | ICD-10-CM | POA: Diagnosis present

## 2024-09-21 DIAGNOSIS — Z800 Family history of malignant neoplasm of digestive organs: Secondary | ICD-10-CM

## 2024-09-21 LAB — CBC WITH DIFFERENTIAL/PLATELET
Abs Immature Granulocytes: 0.04 K/uL (ref 0.00–0.07)
Basophils Absolute: 0.1 K/uL (ref 0.0–0.1)
Basophils Relative: 1 %
Eosinophils Absolute: 0.2 K/uL (ref 0.0–0.5)
Eosinophils Relative: 3 %
HCT: 45.3 % (ref 36.0–46.0)
Hemoglobin: 14.6 g/dL (ref 12.0–15.0)
Immature Granulocytes: 0 %
Lymphocytes Relative: 16 %
Lymphs Abs: 1.4 K/uL (ref 0.7–4.0)
MCH: 27.9 pg (ref 26.0–34.0)
MCHC: 32.2 g/dL (ref 30.0–36.0)
MCV: 86.5 fL (ref 80.0–100.0)
Monocytes Absolute: 0.6 K/uL (ref 0.1–1.0)
Monocytes Relative: 6 %
Neutro Abs: 6.9 K/uL (ref 1.7–7.7)
Neutrophils Relative %: 74 %
Platelets: 311 K/uL (ref 150–400)
RBC: 5.24 MIL/uL — ABNORMAL HIGH (ref 3.87–5.11)
RDW: 13.2 % (ref 11.5–15.5)
WBC: 9.2 K/uL (ref 4.0–10.5)
nRBC: 0 % (ref 0.0–0.2)

## 2024-09-21 LAB — URINALYSIS, ROUTINE W REFLEX MICROSCOPIC
Bilirubin Urine: NEGATIVE
Glucose, UA: NEGATIVE mg/dL
Hgb urine dipstick: NEGATIVE
Ketones, ur: NEGATIVE mg/dL
Nitrite: NEGATIVE
Protein, ur: NEGATIVE mg/dL
Specific Gravity, Urine: 1.014 (ref 1.005–1.030)
WBC, UA: >50 WBC/hpf (ref 0–5)
pH: 7 (ref 5.0–8.0)

## 2024-09-21 LAB — BLOOD GAS, ARTERIAL
Acid-Base Excess: 2.3 mmol/L — ABNORMAL HIGH (ref 0.0–2.0)
Bicarbonate: 24.7 mmol/L (ref 20.0–28.0)
Delivery systems: POSITIVE
Drawn by: 31394
Expiratory PAP: 6 cmH2O
FIO2: 30 %
Inspiratory PAP: 12 cmH2O
O2 Saturation: 99.4 %
Patient temperature: 37.8
RATE: 12 {breaths}/min
pCO2 arterial: 32 mmHg (ref 32–48)
pH, Arterial: 7.5 — ABNORMAL HIGH (ref 7.35–7.45)
pO2, Arterial: 104 mmHg (ref 83–108)

## 2024-09-21 LAB — TROPONIN T, HIGH SENSITIVITY
Troponin T High Sensitivity: <15 ng/L (ref 0–19)
Troponin T High Sensitivity: <15 ng/L (ref 0–19)

## 2024-09-21 LAB — COMPREHENSIVE METABOLIC PANEL WITH GFR
ALT: 40 U/L (ref 0–44)
AST: 32 U/L (ref 15–41)
Albumin: 4.2 g/dL (ref 3.5–5.0)
Alkaline Phosphatase: 96 U/L (ref 38–126)
Anion gap: 11 (ref 5–15)
BUN: 8 mg/dL (ref 8–23)
CO2: 25 mmol/L (ref 22–32)
Calcium: 9.7 mg/dL (ref 8.9–10.3)
Chloride: 103 mmol/L (ref 98–111)
Creatinine, Ser: 0.95 mg/dL (ref 0.44–1.00)
GFR, Estimated: >60 mL/min (ref >60)
Glucose, Bld: 86 mg/dL (ref 70–99)
Potassium: 3.8 mmol/L (ref 3.5–5.1)
Sodium: 139 mmol/L (ref 135–145)
Total Bilirubin: 0.5 mg/dL (ref 0.0–1.2)
Total Protein: 7.4 g/dL (ref 6.5–8.1)

## 2024-09-21 LAB — URINE DRUG SCREEN
Amphetamines: NEGATIVE
Barbiturates: NEGATIVE
Benzodiazepines: NEGATIVE
Cocaine: POSITIVE — AB
Fentanyl: NEGATIVE
Methadone Scn, Ur: NEGATIVE
Opiates: NEGATIVE
Tetrahydrocannabinol: NEGATIVE

## 2024-09-21 LAB — RESP PANEL BY RT-PCR (RSV, FLU A&B, COVID)  RVPGX2
Influenza A by PCR: NEGATIVE
Influenza B by PCR: NEGATIVE
Resp Syncytial Virus by PCR: NEGATIVE
SARS Coronavirus 2 by RT PCR: NEGATIVE

## 2024-09-21 LAB — PRO BRAIN NATRIURETIC PEPTIDE: Pro Brain Natriuretic Peptide: 149 pg/mL (ref <300.0)

## 2024-09-21 LAB — I-STAT CG4 LACTIC ACID, ED: Lactic Acid, Venous: 4 mmol/L (ref 0.5–1.9)

## 2024-09-21 LAB — LIPASE, BLOOD: Lipase: 24 U/L (ref 11–51)

## 2024-09-21 MED ORDER — GUAIFENESIN ER 600 MG PO TB12
600.0000 mg | ORAL_TABLET | Freq: Two times a day (BID) | ORAL | Status: DC
  Administered 2024-09-21 – 2024-09-24 (×6): 600 mg via ORAL
  Filled 2024-09-21 (×6): qty 1

## 2024-09-21 MED ORDER — IPRATROPIUM-ALBUTEROL 0.5-2.5 (3) MG/3ML IN SOLN
3.0000 mL | Freq: Once | RESPIRATORY_TRACT | Status: AC
  Administered 2024-09-21: 3 mL via RESPIRATORY_TRACT
  Filled 2024-09-21: qty 3

## 2024-09-21 MED ORDER — IPRATROPIUM-ALBUTEROL 0.5-2.5 (3) MG/3ML IN SOLN: 3.0000 mL | Freq: Four times a day (QID) | RESPIRATORY_TRACT | Status: DC | PRN

## 2024-09-21 MED ORDER — SODIUM CHLORIDE 0.9 % IV SOLN
2.0000 g | Freq: Once | INTRAVENOUS | Status: AC
  Administered 2024-09-21: 2 g via INTRAVENOUS
  Filled 2024-09-21: qty 20

## 2024-09-21 MED ORDER — SODIUM CHLORIDE 0.9 % IV BOLUS
1000.0000 mL | Freq: Once | INTRAVENOUS | Status: AC
Start: 2024-09-21 — End: 2024-09-21
  Administered 2024-09-21: 1000 mL via INTRAVENOUS

## 2024-09-21 MED ORDER — BUDESONIDE 0.25 MG/2ML IN SUSP
0.2500 mg | Freq: Two times a day (BID) | RESPIRATORY_TRACT | Status: DC
  Administered 2024-09-21 – 2024-09-24 (×6): 0.25 mg via RESPIRATORY_TRACT
  Filled 2024-09-21 (×6): qty 2

## 2024-09-21 MED ORDER — ONDANSETRON HCL 4 MG/2ML IJ SOLN
4.0000 mg | Freq: Four times a day (QID) | INTRAMUSCULAR | Status: DC | PRN
  Administered 2024-09-22 – 2024-09-24 (×4): 4 mg via INTRAVENOUS
  Filled 2024-09-21 (×4): qty 2

## 2024-09-21 MED ORDER — ENOXAPARIN SODIUM 40 MG/0.4ML IJ SOSY
40.0000 mg | PREFILLED_SYRINGE | INTRAMUSCULAR | Status: DC
  Administered 2024-09-22: 40 mg via SUBCUTANEOUS
  Filled 2024-09-21 (×3): qty 0.4

## 2024-09-21 MED ORDER — HYDROXYZINE HCL 10 MG PO TABS
10.0000 mg | ORAL_TABLET | Freq: Three times a day (TID) | ORAL | Status: DC | PRN
  Administered 2024-09-22 – 2024-09-23 (×2): 10 mg via ORAL
  Filled 2024-09-21 (×3): qty 1

## 2024-09-21 MED ORDER — ALBUTEROL SULFATE (2.5 MG/3ML) 0.083% IN NEBU
2.5000 mg | INHALATION_SOLUTION | Freq: Once | RESPIRATORY_TRACT | Status: AC
  Administered 2024-09-21: 2.5 mg via RESPIRATORY_TRACT
  Filled 2024-09-21: qty 3

## 2024-09-21 MED ORDER — SODIUM CHLORIDE 0.9 % IV SOLN
1.0000 g | INTRAVENOUS | Status: DC
  Administered 2024-09-22 – 2024-09-23 (×2): 1 g via INTRAVENOUS
  Filled 2024-09-21 (×2): qty 10

## 2024-09-21 MED ORDER — IPRATROPIUM BROMIDE 0.02 % IN SOLN: 0.5000 mg | Freq: Once | RESPIRATORY_TRACT | Status: DC

## 2024-09-21 MED ORDER — HYDROCODONE-ACETAMINOPHEN 5-325 MG PO TABS
1.0000 | ORAL_TABLET | ORAL | Status: DC | PRN
  Administered 2024-09-21: 1 via ORAL
  Administered 2024-09-22: 2 via ORAL
  Filled 2024-09-21: qty 2
  Filled 2024-09-21 (×2): qty 1

## 2024-09-21 MED ORDER — ALBUTEROL SULFATE (2.5 MG/3ML) 0.083% IN NEBU: 10.0000 mg/h | INHALATION_SOLUTION | Freq: Once | RESPIRATORY_TRACT | Status: DC

## 2024-09-21 MED ORDER — ALBUTEROL SULFATE (2.5 MG/3ML) 0.083% IN NEBU: 2.5000 mg | INHALATION_SOLUTION | Freq: Once | RESPIRATORY_TRACT | Status: DC

## 2024-09-21 MED ORDER — LACTATED RINGERS IV SOLN: INTRAVENOUS | Status: AC

## 2024-09-21 MED ORDER — SODIUM CHLORIDE 0.9% FLUSH
3.0000 mL | Freq: Two times a day (BID) | INTRAVENOUS | Status: DC
  Administered 2024-09-21 – 2024-09-24 (×6): 3 mL via INTRAVENOUS

## 2024-09-21 MED ORDER — SENNOSIDES-DOCUSATE SODIUM 8.6-50 MG PO TABS: 1.0000 | ORAL_TABLET | Freq: Every evening | ORAL | Status: DC | PRN

## 2024-09-21 MED ORDER — IOHEXOL 300 MG/ML  SOLN
100.0000 mL | Freq: Once | INTRAMUSCULAR | Status: AC | PRN
  Administered 2024-09-21: 100 mL via INTRAVENOUS

## 2024-09-21 MED ORDER — IPRATROPIUM-ALBUTEROL 0.5-2.5 (3) MG/3ML IN SOLN
3.0000 mL | Freq: Once | RESPIRATORY_TRACT | Status: DC
Start: 2024-09-21 — End: 2024-09-21

## 2024-09-21 MED ORDER — NICOTINE 21 MG/24HR TD PT24
21.0000 mg | MEDICATED_PATCH | Freq: Every day | TRANSDERMAL | Status: DC
  Administered 2024-09-21 – 2024-09-24 (×4): 21 mg via TRANSDERMAL
  Filled 2024-09-21 (×4): qty 1

## 2024-09-21 MED ORDER — METHYLPREDNISOLONE SODIUM SUCC 40 MG IJ SOLR
40.0000 mg | Freq: Two times a day (BID) | INTRAMUSCULAR | Status: DC
  Administered 2024-09-21 – 2024-09-24 (×6): 40 mg via INTRAVENOUS
  Filled 2024-09-21 (×6): qty 1

## 2024-09-21 MED ORDER — ONDANSETRON HCL 4 MG PO TABS: 4.0000 mg | ORAL_TABLET | Freq: Four times a day (QID) | ORAL | Status: DC | PRN

## 2024-09-21 MED ORDER — ACETAMINOPHEN 325 MG PO TABS
650.0000 mg | ORAL_TABLET | Freq: Once | ORAL | Status: AC
  Administered 2024-09-21: 650 mg via ORAL
  Filled 2024-09-21: qty 2

## 2024-09-21 MED ORDER — ARFORMOTEROL TARTRATE 15 MCG/2ML IN NEBU
15.0000 ug | INHALATION_SOLUTION | Freq: Two times a day (BID) | RESPIRATORY_TRACT | Status: DC
  Administered 2024-09-21 – 2024-09-24 (×6): 15 ug via RESPIRATORY_TRACT
  Filled 2024-09-21 (×6): qty 2

## 2024-09-21 MED ORDER — SODIUM CHLORIDE 0.9 % IV SOLN
500.0000 mg | Freq: Once | INTRAVENOUS | Status: AC
  Administered 2024-09-21: 500 mg via INTRAVENOUS
  Filled 2024-09-21: qty 5

## 2024-09-21 MED ORDER — ACETAMINOPHEN 325 MG PO TABS
650.0000 mg | ORAL_TABLET | Freq: Four times a day (QID) | ORAL | Status: DC | PRN
  Administered 2024-09-22 – 2024-09-24 (×2): 650 mg via ORAL
  Filled 2024-09-21 (×2): qty 2

## 2024-09-21 MED ORDER — ACETAMINOPHEN 650 MG RE SUPP: 650.0000 mg | Freq: Four times a day (QID) | RECTAL | Status: DC | PRN

## 2024-09-21 MED ORDER — METHYLPREDNISOLONE SODIUM SUCC 125 MG IJ SOLR
125.0000 mg | Freq: Once | INTRAMUSCULAR | Status: AC
  Administered 2024-09-21: 125 mg via INTRAVENOUS
  Filled 2024-09-21: qty 2

## 2024-09-21 NOTE — Hospital Course (Addendum)
 Kathleen [name] is a 63 y.o. female with medical history significant for COPD/asthma overlap syndrome, HLD, depression/anxiety, OSA not on CPAP, polysubstance use (tobacco, cocaine, THC) who is admitted with respiratory distress due to COPD exacerbation. Found to have rhinovirus. Treated overnight with BIPAP, transitioned to [place] O2.

## 2024-09-21 NOTE — ED Notes (Signed)
 Lab called for Add-On Lipase

## 2024-09-21 NOTE — ED Triage Notes (Signed)
 C/o SOB, difficulty speaking, gasping for air. Hx COPD. Tried rescue meds and inhalers at home without relief.

## 2024-09-21 NOTE — H&P (Addendum)
 History and Physical    Kathleen [name] FMW:[id number] DOB: [birth year] DOA: 09/21/2024  PCP: Renne Homans, MD  Patient coming from: Home  I have personally briefly reviewed patient's old medical records in [facility]  Chief Complaint: Shortness of breath  HPI: Kathleen [name] is a 63 y.o. female with medical history significant for COPD/asthma overlap syndrome, HLD, depression/anxiety, OSA not on CPAP, polysubstance use (tobacco, cocaine, THC) who presented to the ED for evaluation of shortness of breath.  Patient reports developing shortness of breath last night with new nonproductive cough.  She has been having lower abdominal pain and some burning sensation with urination as well.  She tried her home nebulizers without adequate relief.  She developed significant dyspnea today and drove herself to the ED.  On arrival she was noted to be in respiratory distress with accessory muscle use and only able to speak a few words at a time.  She was placed on BiPAP with improvement and subsequently transition to 2 L supplemental O2 via [place].  At time of admitting evaluation patient feels that her dyspnea is worsening again and requesting to be placed back on BiPAP.  ED Course  Labs/Imaging on admission: I have personally reviewed following labs and imaging studies.  Initial vitals showed BP 143/94, pulse 84, RR 24, temp 100.1 F, SpO2 100% while on BiPAP at 30% FiO2.  Tmax 101.4 F.  Patient subsequently weaned to 2 L supplemental O2 via [place].  Labs showed WBC 9.2, hemoglobin 14.6, platelets 311, sodium 139, potassium 3.8, bicarb 25, BUN 8, creatinine 0.95, serum glucose 86, LFTs within normal limits, proBNP 149, lipase 24, troponin T <15 x 2.  SARS-CoV-2, influenza, RSV PCR negative.  UDS positive for cocaine.  Blood cultures ordered and pending collection.  ABG showed pH 7.5, pCO2 32, pO2 104 while on BiPAP 30% FiO2.  Patient was given IV Solu-Medrol  125 mg, IV ceftriaxone  and azithromycin ,  1 L normal saline, albuterol  and DuoNeb treatment.  The hospitalist service was consulted for admission.  Review of Systems: All systems reviewed and are negative except as documented in history of present illness above.   Past Medical History:  Diagnosis Date   Acute respiratory failure with hypoxia and hypercapnia (HCC) 03/08/2023   Anxiety    Arthritis    hands   Asthma    2 sets of PFT's in 04/09 without sign variability. Last set with significant decrease in FEV1 with saline alone, suggesting Asthma but  recommended clinical corelation    Asthma    Asthma exacerbation 04/07/2023   Bipolar 1 disorder [facility])    therapist is Joy and is followed by [employer] [facility]   Blackout    negative work up including ESR, ANA, opthalmology referral, carotid dopplers, 2D echo, MRI and EEG.   BREAST LUMP 03/25/2008   Annotation: bilaterally Qualifier: Diagnosis of  By: Rogerio MD, Starlyn BIRCH.    Breast mass in female    s/p mammogram, u/s and biopsy in 05/09 c/w fibroadenoma,   Bronchitis    Chronic headache    Chronic low back pain 08/08/2008   Qualifier: Diagnosis of  By: Tanda  MD, Valerie     Chronic pain    normal work up including TSH, RPR, B12, HIV, plain films, 2 ESR's, ANA, CK, RF along with routine CBC, CMET and UA. Further work up includes CRP, ESR, SPEP/UPEP, hepatitis erology, A1C , repeat ANA   COPD (chronic obstructive pulmonary disease) (HCC)    COPD exacerbation (  HCC) 03/31/2019   Depression    DUB (dysfunctional uterine bleeding)    and pelvic pain, with negative endometrial bx in 07/09 and transvaginal U/S significant for mild fibroids in 0/09.   Emphysema of lung (HCC)    GERD (gastroesophageal reflux disease)    Hallucinations 03/18/2008   Qualifier: Diagnosis of  By: Tanda  MD, [place]     Hyperlipidemia    Lower extremity edema    Neg ABI's, normal 2D echo, normal albumin   Menorrhagia    Ovarian cyst    Polysubstance abuse (HCC)    none since March  17,2009.   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;  Service: Orthopedics;  Laterality: Left;   POLYPECTOMY     VAGINA SURGERY     mesh    Social History: Social History   Tobacco Use   Smoking status: Every Day    Current packs/day: 0.00    Average packs/day: 0.3 packs/day for 30.0 years (7.5 ttl pk-yrs)    Types: Cigarettes    Start date: 12/28/1986    Last attempt to quit: 12/28/2016    Years since quitting: 7.7   Smokeless tobacco: Never   Tobacco comments:    Quit 6 weeks ago as of 05/19/23  Vaping Use   Vaping status: Never Used  Substance Use Topics   Alcohol use: Yes   Drug use: Yes    Types: Marijuana    Comment: Sometimes, last smoked 3 weeks ago   Allergies  Allergen Reactions   Bee Venom Anaphylaxis   Peach [Prunus Persica] Anaphylaxis, Swelling and Other (See Comments)    Throat swells , breaks out   Topamax [Topiramate] Other (See Comments)    Hallucinations    Tramadol Nausea Only    Family History  Problem Relation Age of Onset   Colon cancer Mother 72   Breast cancer Mother 26   Rectal cancer Mother    Diabetes Father    Hypertension Father    Kidney disease Father    Colon cancer Father 73   Prostate cancer Father    Cancer Father    Kidney disease Sister    Cancer Brother    Breast cancer Maternal Grandmother 103   Esophageal cancer Neg Hx    Stomach cancer Neg Hx      Prior to Admission medications   Medication Sig Start Date End Date Taking?  Authorizing Provider  albuterol  (PROVENTIL ) (2.5 MG/3ML) 0.083% nebulizer solution Take 3 mLs (2.5 mg total) by nebulization every 2 (two) hours as needed for wheezing. 06/15/24   Barbarann Nest, MD  arformoterol  (BROVANA ) 15 MCG/2ML NEBU Take 2 mLs (15 mcg total) by nebulization 2 (two) times daily. 09/06/24   Barbarann Nest, MD  budesonide  (PULMICORT ) 0.25 MG/2ML nebulizer solution Take 2 mLs (0.25 mg total) by nebulization 2 (two) times daily. 09/06/24   Barbarann Nest, MD  hydrOXYzine  (ATARAX )  25 MG tablet TAKE 1 TABLET BY MOUTH THREE TIMES DAILY AS NEEDED FOR ANXIETY Patient taking differently: Take 25 mg by mouth 3 (three) times daily as needed for anxiety. 07/18/24   Amoako, Prince, MD  ipratropium-albuterol  (DUONEB) 0.5-2.5 (3) MG/3ML SOLN Take 3 mLs by nebulization every 6 (six) hours. 08/22/24   Pearlean Manus, MD  loratadine  (CLARITIN ) 10 MG tablet Take 1 tablet (10 mg total) by mouth daily. 02/01/23   Samtani, Jai-Gurmukh, MD  nicotine  (NICODERM CQ  - DOSED IN MG/24 HOURS) 14 mg/24hr patch Place 1 patch (14 mg total) onto the skin daily. 09/07/24   Barbarann Nest, MD  ondansetron  (ZOFRAN ) 4 MG tablet TAKE 1 TABLET BY MOUTH ONCE DAILY AS NEEDED FOR NAUSEA FOR VOMITING Patient taking differently: Take 4 mg by mouth daily as needed for vomiting or nausea. 06/07/24   Renne Homans, MD    Physical Exam: Vitals:   09/21/24 1836 09/21/24 2013 09/21/24 2020 09/21/24 2021  BP: 131/88     Pulse: 92 96    Resp: (!) 21 (!) 23    Temp:      TempSrc:      SpO2: 98% 99% 99% 99%   Constitutional: Resting in bed with head elevated. Eyes: EOMI, lids and conjunctivae normal ENMT: Mucous membranes are moist. Posterior pharynx clear of any exudate or lesions.Normal dentition.  Neck: normal, supple, no masses. Respiratory: Expiratory wheezing throughout.  Increased respiratory effort while on 2 L O2 [place]. No accessory muscle use.  Cardiovascular: Regular rate and rhythm, no murmurs / rubs / gallops. No  extremity edema. 2+ pedal pulses. Abdomen: Suprapubic tenderness, no masses palpated. Musculoskeletal: no clubbing / cyanosis. No joint deformity upper and lower extremities. Good ROM, no contractures. Normal muscle tone.  Skin: no rashes, lesions, ulcers. No induration Neurologic: Sensation intact. Strength 5/5 in all 4.  Psychiatric: Alert and oriented x 3. Normal mood.   EKG: Personally reviewed. Sinus rhythm, rate 88, LAFB, no acute ischemic changes.  Similar to prior.  Assessment/Plan Principal Problem:   COPD with acute exacerbation (HCC) Active Problems:   Dysuria   Polysubstance use disorder   Kathleen [name] is a 63 y.o. female with medical history significant for COPD/asthma overlap syndrome, HLD, depression/anxiety, OSA not on CPAP, polysubstance use (tobacco, cocaine, THC) who is admitted with respiratory distress due to COPD exacerbation.  Assessment and Plan: COPD with acute exacerbation: Presenting with respiratory distress requiring BiPAP on arrival.  Has not been hypoxic.  Significant wheezing noted on exam.  Fever of 101 F noted.  No leukocytosis.  CXR without evidence of pneumonia.  COVID, influenza, RSV negative. - Admit to progressive unit - Continue BiPAP as needed - Brovana /Pulmicort  BID, DuoNebs as needed - IV Solu-Medrol  40 mg twice daily - Check full respiratory viral panel  Dysuria: Patient reports suprapubic pain and dysuria.  UA is abnormal with pyuria, large leukocytes, rare bacteria.  CT A/P negative for acute pathology. - Continue IV ceftriaxone  - Add on urine culture  Polysubstance use: Patient states she started smoking again recently since she was unable to get her nicotine  patch.  Nicotine  patch provided and smoking cessation advised.  UDS is positive for cocaine.   Depression/anxiety: Continue Atarax  as needed.   DVT prophylaxis: enoxaparin  (LOVENOX ) injection 40 mg Start: 09/21/24 2200 Code Status: Full code, confirmed with patient on  admission Family Communication: Discussed with patient, she has discussed with family Disposition Plan: From home, dispo pending clinical progress Consults called: None Severity of Illness: The appropriate  patient status for this patient is OBSERVATION. Observation status is judged to be reasonable and necessary in order to provide the required intensity of service to ensure the patient's safety. The patient's presenting symptoms, physical exam findings, and initial radiographic and laboratory data in the context of their medical condition is felt to place them at decreased risk for further clinical deterioration. Furthermore, it is anticipated that the patient will be medically stable for discharge from the hospital within 2 midnights of admission.   Jorie Blanch MD [facility]  If 7PM-7AM, please contact night-coverage [email or web address]  09/21/2024, 9:02 PM

## 2024-09-21 NOTE — Sepsis Progress Note (Addendum)
 1753 eLink is following this Code Sepsis.  1830 Notified provider of need to order lactic acid.

## 2024-09-21 NOTE — ED Provider Notes (Signed)
  [facility] EM[facility] DE[facility]MENT AT [facility] Provider Assume Care Note I assumed care of Kathleen [name] on 09/21/2024 at 3 PM from Dr. Freddi.   Briefly, Kathleen [name] is a 63 y.o. female who: PMHx: Asthma, COPD not on home oxygen , polysubstance use disorder, bipolar 1 P/w shortness of breath, patient on initial provider evaluation did not have wheezing, however had significantly increased work of breathing, was placed on BiPAP, given DuoNebs.  Thereafter patient with improved work of breathing, however developed wheezing, and further and today also became febrile, developed tachycardia, as well as complaining of abdominal pain.  Plan at the time of handoff: Follow-up lipase, delta troponin, CT of the abdomen and pelvis, reassess respiratory status   Please refer to the original provider's note for additional information regarding the care of Kathleen [name].  Reassessment: I personally reassessed the patient: Patient complained of ongoing dyspnea.   Vital Signs:  ED Triage Vitals  Encounter Vitals Group     BP 09/21/24 1325 (!) 143/94     Girls Systolic BP Percentile --      Girls Diastolic BP Percentile --      Boys Systolic BP Percentile --      Boys Diastolic BP Percentile --      Pulse Rate 09/21/24 1307 95     Resp 09/21/24 1307 (!) 34     Temp 09/21/24 1335 100.1 F (37.8 C)     Temp Source 09/21/24 1335 Axillary     SpO2 09/21/24 1307 100 %     Weight --      Height --      Head Circumference --      Peak Flow --      Pain Score --      Pain Loc --      Pain Education --      Exclude from Growth Chart --      Hemodynamics:  The patient is hemodynamically stable. Mental Status:  The patient is alert  Additional MDM: Lipase WNL, delta troponin undetectable, however on reevaluation, patient has persistent wheezing, persistent tachypnea and dyspnea and requiring oxygen  for dyspnea.  Given SIRS positive vitals, febrile status, do feel that blood cultures  and antibiotics are reasonable.  Ceftriaxone  and azithromycin  ordered, hospitalist consulted for admission.  Discussed with Dr. Tobie who accepted the patient to his service.  Disposition: ADMIT: I believe the patient requires admission for further care and management. The patient was admitted to hospitalists. Please see inpatient provider note for additional treatment plan details.    FREDRIK CANDIE Later, MD Emergency Medicine    Later Kathleen RAMAN, MD 09/21/24 2004

## 2024-09-21 NOTE — ED Notes (Addendum)
 Patient refused to get Lovenox  sub q at this time by RN due to saying I want to do it myself they let me do it myself here and on the floor when I am here Nurse kindly spoke with patient and let her know that I will not allow her to admin her lovonox sub q by herself. Patient responded  well call the Dr. Provider notified. Patient also self removed her CPAP at this time requesting for Respiratory to come get the machine.

## 2024-09-21 NOTE — ED Notes (Signed)
 Respiratory called for patient to be put back on CPAP per patient request. Patient sats 98% on room air. Patient stated she is SOB. Patient placed on 2L [place] for comfort. Respiratory called.

## 2024-09-21 NOTE — ED Notes (Signed)
 Pt independently ambulatory to and from restroom with steady gait.  Urine specimen collected.

## 2024-09-21 NOTE — Progress Notes (Deleted)
 CC: ***  HPI:  Ms.Tacia F Lyne is a 63 y.o. female with a past medical history of COPD-asthma overlap, GERD, bipolar disorder who presents for follow-up appointment.  Please see assessment and plan for full HPI.  Medications: Allergies: Claritin  10 mg daily Nausea: Zofran  4 mg daily as needed COPD: Albuterol  2.5 mg nebulizer every 2 hours as needed, Brovana  nebulizer 15 mcg twice daily, Pulmicort  nebulizers 0.25 mg twice daily, DuoNebs Anxiety: Hydroxyzine  25 mg 3 times daily as needed Tobacco use: Nicotine  patch    Past Medical History:  Diagnosis Date   Acute respiratory failure with hypoxia and hypercapnia (HCC) 03/08/2023   Anxiety    Arthritis    hands   Asthma    2 sets of PFT's in 04/09 without sign variability. Last set with significant decrease in FEV1 with saline alone, suggesting Asthma but  recommended clinical corelation    Asthma    Asthma exacerbation 04/07/2023   Bipolar 1 disorder [facility])    therapist is Joy and is followed by [employer] health   Blackout    negative work up including ESR, ANA, opthalmology referral, carotid dopplers, 2D echo, MRI and EEG.   BREAST LUMP 03/25/2008   Annotation: bilaterally Qualifier: Diagnosis of  By: Rogerio MD, Starlyn BIRCH.    Breast mass in female    s/p mammogram, u/s and biopsy in 05/09 c/w fibroadenoma,   Bronchitis    Chronic headache    Chronic low back pain 08/08/2008   Qualifier: Diagnosis of  By: Tanda  MD, Valerie     Chronic pain    normal work up including TSH, RPR, B12, HIV, plain films, 2 ESR's, ANA, CK, RF along with routine CBC, CMET and UA. Further work up includes CRP, ESR, SPEP/UPEP, hepatitis erology, A1C , repeat ANA   COPD (chronic obstructive pulmonary disease) (HCC)    COPD exacerbation (HCC) 03/31/2019   Depression    DUB (dysfunctional uterine bleeding)    and pelvic pain, with negative endometrial bx in 07/09 and transvaginal U/S significant for mild fibroids in 0/09.   Emphysema of  lung (HCC)    GERD (gastroesophageal reflux disease)    Hallucinations 03/18/2008   Qualifier: Diagnosis of  By: Tanda  MD, [place]     Hyperlipidemia    Lower extremity edema    Neg ABI's, normal 2D echo, normal albumin   Menorrhagia    Ovarian cyst    Polysubstance abuse (HCC)    none since March 17,2009.   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   Tubular adenoma of colon      Current Outpatient Medications:    albuterol  (PROVENTIL ) (2.5 MG/3ML) 0.083% nebulizer solution, Take 3 mLs (2.5 mg total) by nebulization every 2 (two) hours as needed for wheezing., Disp: 75 mL, Rfl: 0   arformoterol  (BROVANA ) 15 MCG/2ML NEBU, Take 2 mLs (15 mcg total) by nebulization 2 (two) times daily., Disp: 120 mL, Rfl: 0   budesonide  (PULMICORT ) 0.25 MG/2ML nebulizer solution, Take 2 mLs (0.25 mg total) by nebulization 2 (two) times daily., Disp: 60 mL, Rfl: 12   hydrOXYzine  (ATARAX ) 25 MG tablet, TAKE 1 TABLET BY MOUTH THREE TIMES DAILY AS NEEDED FOR ANXIETY (Patient taking differently: Take 25 mg by mouth 3 (three) times daily as needed for anxiety.), Disp: 30 tablet, Rfl: 0   ipratropium-albuterol  (DUONEB) 0.5-2.5 (3) MG/3ML SOLN, Take 3 mLs by  nebulization every 6 (six) hours., Disp: 360 mL, Rfl: 0   loratadine  (CLARITIN ) 10 MG tablet, Take 1 tablet (10 mg total) by mouth daily., Disp: 30 tablet, Rfl: 11   nicotine  (NICODERM CQ  - DOSED IN MG/24 HOURS) 14 mg/24hr patch, Place 1 patch (14 mg total) onto the skin daily., Disp: 28 patch, Rfl: 0   ondansetron  (ZOFRAN ) 4 MG tablet, TAKE 1 TABLET BY MOUTH ONCE DAILY AS NEEDED FOR NAUSEA FOR VOMITING (Patient taking differently: Take 4 mg by mouth daily as needed for vomiting or nausea.), Disp: 30 tablet, Rfl: 0  Review of Systems:  ***  Constitutional: Eye: Respiratory: Cardiovascular: GI: MSK: GU: Skin: Neuro: Endocrine:   Physical Exam:  There were no vitals filed for this  visit. *** General: Patient is sitting comfortably in the room  Eyes: Pupils equal and reactive to light, EOM intact  Head: Normocephalic, atraumatic  Neck: Supple, nontender, full range of motion, No JVD Cardio: Regular rate and rhythm, no murmurs, rubs or gallops. 2+ pulses to bilateral upper and lower extremities  Chest: No chest tenderness Pulmonary: Clear to ausculation bilaterally with no rales, rhonchi, and crackles  Abdomen: Soft, nontender with normoactive bowel sounds with no rebound or guarding  Neuro: Alert and orientated x3. CN II-XII intact. Sensation intact to upper and lower extremities. 2+ patellar reflex.  Back: No midline tenderness, no step off or deformities noted. No paraspinal muscle tenderness.  Skin: No rashes noted  MSK: 5/5 strength to upper and lower extremities.    Assessment & Plan:   Assessment & Plan      Patient {GC/GE:[id number]::discussed with,seen with} Dr. {[id number]::Hlpoonli,Ynqqfjw,Floozw,Wjmzwimj,Tpoopjfd,Cpwrzwu}  Libby Blanch, DO Internal Medicine Resident PGY-3

## 2024-09-21 NOTE — ED Provider Notes (Signed)
 [place] EMERGENCY DEPARTMENT AT [facility] Provider Note   CSN: [id number] Arrival date & time: 09/21/24  1252     Patient presents with: Shortness of Breath   Kathleen [name] is a 63 y.o. female.   HPI 63 year old female presents with respiratory distress.  She drove herself here and states that starting yesterday she developed shortness of breath that is much worse today.  History is very limited as she is only getting a few words out at a time.  She also seems to indicate she is having some abdominal pain. States she was just hear last week with the same. States this feels like an asthma/COPD exacerbation.  Prior to Admission medications   Medication Sig Start Date End Date Taking? Authorizing Provider  albuterol  (PROVENTIL ) (2.5 MG/3ML) 0.083% nebulizer solution Take 3 mLs (2.5 mg total) by nebulization every 2 (two) hours as needed for wheezing. 06/15/24   Barbarann Nest, MD  arformoterol  (BROVANA ) 15 MCG/2ML NEBU Take 2 mLs (15 mcg total) by nebulization 2 (two) times daily. 09/06/24   Barbarann Nest, MD  budesonide  (PULMICORT ) 0.25 MG/2ML nebulizer solution Take 2 mLs (0.25 mg total) by nebulization 2 (two) times daily. 09/06/24   Barbarann Nest, MD  hydrOXYzine  (ATARAX ) 25 MG tablet TAKE 1 TABLET BY MOUTH THREE TIMES DAILY AS NEEDED FOR ANXIETY Patient taking differently: Take 25 mg by mouth 3 (three) times daily as needed for anxiety. 07/18/24   Amoako, Prince, MD  ipratropium-albuterol  (DUONEB) 0.5-2.5 (3) MG/3ML SOLN Take 3 mLs by nebulization every 6 (six) hours. 08/22/24   Pearlean Manus, MD  loratadine  (CLARITIN ) 10 MG tablet Take 1 tablet (10 mg total) by mouth daily. 02/01/23   Samtani, Jai-Gurmukh, MD  nicotine  (NICODERM CQ  - DOSED IN MG/24 HOURS) 14 mg/24hr patch Place 1 patch (14 mg total) onto the skin daily. 09/07/24   Barbarann Nest, MD  ondansetron  (ZOFRAN ) 4 MG tablet TAKE 1 TABLET BY MOUTH ONCE DAILY AS NEEDED FOR NAUSEA FOR VOMITING Patient taking  differently: Take 4 mg by mouth daily as needed for vomiting or nausea. 06/07/24   Renne Homans, MD    Allergies: Bee venom, Peach [prunus persica], Topamax [topiramate], and Tramadol    Review of Systems  Unable to perform ROS: Severe respiratory distress    Updated Vital Signs BP (!) 146/94 (BP Location: Right Arm)   Pulse 92   Temp (!) 101.4 F (38.6 C) (Oral)   Resp (!) 22   LMP 11/08/2014   SpO2 99%   Physical Exam Vitals and nursing note reviewed.  Constitutional:      Appearance: She is well-developed.  HENT:     Head: Normocephalic and atraumatic.  Cardiovascular:     Rate and Rhythm: Normal rate and regular rhythm.     Heart sounds: Normal heart sounds.  Pulmonary:     Effort: Accessory muscle usage and respiratory distress present. No tachypnea.     Breath sounds: Normal breath sounds. No wheezing.  Abdominal:     Palpations: Abdomen is soft.     Tenderness: There is abdominal tenderness.  Musculoskeletal:     Right lower leg: No edema.     Left lower leg: No edema.  Skin:    General: Skin is warm and dry.  Neurological:     Mental Status: She is alert.     (all labs ordered are listed, but only abnormal results are displayed) Labs Reviewed  CBC WITH DIFFERENTIAL/PLATELET - Abnormal; Notable for the following components:  Result Value   RBC 5.24 (*)    All other components within normal limits  BLOOD GAS, ARTERIAL - Abnormal; Notable for the following components:   pH, Arterial 7.5 (*)    Acid-Base Excess 2.3 (*)    All other components within normal limits  RESP PANEL BY RT-PCR (RSV, FLU A&B, COVID)  [id number]  COMPREHENSIVE METABOLIC PANEL WITH GFR  PRO BRAIN NATRIURETIC PEPTIDE  URINE DRUG SCREEN  URINALYSIS, ROUTINE W REFLEX MICROSCOPIC  LIPASE, BLOOD  TROPONIN T, HIGH SENSITIVITY  TROPONIN T, HIGH SENSITIVITY    EKG: EKG Interpretation Date/Time:  Friday September 21 2024 13:23:53 EDT Ventricular Rate:  88 PR Interval:  151 QRS  Duration:  80 QT Interval:  389 QTC Calculation: 471 R Axis:   -63  Text Interpretation: Sinus rhythm Left anterior fascicular block Abnormal R-wave progression, early transition similar to earlier in the day Confirmed by Freddi Hamilton [id number]) on 09/21/2024 2:04:38 PM  Radiology: DG Chest Portable 1 View Result Date: 09/21/2024 EXAM: 1 VIEW(S) XRAY OF THE CHEST 09/21/2024 01:51:00 PM COMPARISON: 09/03/2024 CLINICAL HISTORY: respiratory distress. Per chart: C/o SOB, difficulty speaking, gasping for air. Hx COPD. FINDINGS: LUNGS AND PLEURA: No focal pulmonary opacity. No pulmonary edema. No pleural effusion. No pneumothorax. HEART AND MEDIASTINUM: No acute abnormality of the cardiac and mediastinal silhouettes. BONES AND SOFT TISSUES: No acute osseous abnormality. IMPRESSION: 1. No acute cardiopulmonary process. Electronically signed by: Lynwood Seip MD 09/21/2024 02:19 PM EDT RP Workstation: [id number]      .Critical Care  Performed by: Freddi Hamilton, MD Authorized by: Freddi Hamilton, MD   Critical care provider statement:    Critical care time (minutes):  30   Critical care time was exclusive of:  Separately billable procedures and treating other patients   Critical care was necessary to treat or prevent imminent or life-threatening deterioration of the following conditions:  Respiratory failure   Critical care was time spent personally by me on the following activities:  Development of treatment plan with patient or surrogate, discussions with consultants, evaluation of patient's response to treatment, examination of patient, ordering and review of laboratory studies, ordering and review of radiographic studies, ordering and performing treatments and interventions, pulse oximetry, re-evaluation of patient's condition and review of old charts    Medications Ordered in the ED  methylPREDNISolone  sodium succinate (SOLU-MEDROL ) 125 mg/2 mL injection 125 mg (125 mg Intravenous Given  09/21/24 1404)  ipratropium-albuterol  (DUONEB) 0.5-2.5 (3) MG/3ML nebulizer solution 3 mL (3 mLs Nebulization Given 09/21/24 1327)  albuterol  (PROVENTIL ) (2.5 MG/3ML) 0.083% nebulizer solution 2.5 mg (2.5 mg Nebulization Given 09/21/24 1327)  sodium chloride  0.9 % bolus 1,000 mL (1,000 mLs Intravenous New Bag/Given 09/21/24 1403)  acetaminophen  (TYLENOL ) tablet 650 mg (650 mg Oral Given by Other 09/21/24 1428)    Clinical Course as of 09/21/24 1513  Fri Sep 21, 2024  1438 Patient is now feeling a lot better.  She is speaking to me in complete sentences.  I was able to take the BiPAP off of her.  She endorses abdominal pain, cough, vomiting, diarrhea and chest pain since yesterday.  She is having abdominal tenderness.  Found to have a temperature of 101.4.  Will give some Tylenol  for that and order a CT.  Interestingly she now actually has some wheezing, which I did not hear earlier.  Labs so far reassuring.  She is febrile but otherwise not septic and I do not think adding a lactate after albuterol  will help as it will most likely  be high.  Will continue with some fluids and see what the CT shows. [SG]       Clinical Course User Index [LS] Rogelia Jerilynn RAMAN, MD [SG] Freddi Hamilton, MD                                 Medical Decision Making Amount and/or Complexity of Data Reviewed Labs: ordered.    Details: Normal WBC Radiology: ordered and independent interpretation performed.    Details: No pneumonia ECG/medicine tests: ordered and independent interpretation performed.    Details: No ischemia  Risk OTC drugs. Prescription drug management.   Patient presents with respiratory distress.  Put on BiPAP and given a breathing treatment.  Now seems to have a little more wheezing but also is feeling substantially better and was able to be taken off BiPAP, as above.  Will get CT given her abdominal pain and fever.  As above, does not really meet criteria for sepsis otherwise, will give some  fluids and Tylenol  and will see what the rest of her workup shows. Care transferred to Dr. Rogelia.     Final diagnoses:  None    ED Discharge Orders     None          Freddi Hamilton, MD 09/21/24 1514

## 2024-09-21 NOTE — Progress Notes (Signed)
   09/21/24 2013  BiPAP/CPAP/SIPAP  BiPAP/CPAP/SIPAP Pt Type Adult  BiPAP/CPAP/SIPAP SERVO  Mask Type Full face mask  Dentures removed? No (pt refused to have them taken out)  Mask Size Medium  Set Rate 12 breaths/min  Respiratory Rate 23 breaths/min  IPAP 12 cmH20  EPAP 6 cmH2O  Pressure Support 6 cmH20 (pc)  PEEP 6 cmH20  FiO2 (%) 30 %  Minute Ventilation 15.5  Leak 18  Peak Inspiratory Pressure (PIP) 12  Tidal Volume (Vt) 599  Patient Home Machine No  Patient Home Mask No  Patient Home Tubing No  Auto Titrate No  Press High Alarm 25 cmH2O  Device Plugged into RED Power Outlet Yes  BiPAP/CPAP /SiPAP Vitals  Pulse Rate 96  Resp (!) 23  SpO2 99 %  Bilateral Breath Sounds Diminished  MEWS Score/Color  MEWS Score 1  MEWS Score Color Green

## 2024-09-22 ENCOUNTER — Other Ambulatory Visit: Payer: Self-pay

## 2024-09-22 ENCOUNTER — Encounter (HOSPITAL_COMMUNITY): Payer: Self-pay | Admitting: Internal Medicine

## 2024-09-22 DIAGNOSIS — F419 Anxiety disorder, unspecified: Secondary | ICD-10-CM | POA: Diagnosis present

## 2024-09-22 DIAGNOSIS — Z833 Family history of diabetes mellitus: Secondary | ICD-10-CM | POA: Diagnosis not present

## 2024-09-22 DIAGNOSIS — Z1152 Encounter for screening for COVID-19: Secondary | ICD-10-CM | POA: Diagnosis not present

## 2024-09-22 DIAGNOSIS — Z91030 Bee allergy status: Secondary | ICD-10-CM | POA: Diagnosis not present

## 2024-09-22 DIAGNOSIS — J439 Emphysema, unspecified: Secondary | ICD-10-CM | POA: Diagnosis present

## 2024-09-22 DIAGNOSIS — Z860101 Personal history of adenomatous and serrated colon polyps: Secondary | ICD-10-CM | POA: Diagnosis not present

## 2024-09-22 DIAGNOSIS — F17210 Nicotine dependence, cigarettes, uncomplicated: Secondary | ICD-10-CM | POA: Diagnosis present

## 2024-09-22 DIAGNOSIS — Z800 Family history of malignant neoplasm of digestive organs: Secondary | ICD-10-CM | POA: Diagnosis not present

## 2024-09-22 DIAGNOSIS — Z716 Tobacco abuse counseling: Secondary | ICD-10-CM | POA: Diagnosis not present

## 2024-09-22 DIAGNOSIS — Z7951 Long term (current) use of inhaled steroids: Secondary | ICD-10-CM | POA: Diagnosis not present

## 2024-09-22 DIAGNOSIS — B974 Respiratory syncytial virus as the cause of diseases classified elsewhere: Secondary | ICD-10-CM | POA: Diagnosis present

## 2024-09-22 DIAGNOSIS — Z8042 Family history of malignant neoplasm of prostate: Secondary | ICD-10-CM | POA: Diagnosis not present

## 2024-09-22 DIAGNOSIS — J441 Chronic obstructive pulmonary disease with (acute) exacerbation: Secondary | ICD-10-CM | POA: Diagnosis present

## 2024-09-22 DIAGNOSIS — B348 Other viral infections of unspecified site: Secondary | ICD-10-CM | POA: Diagnosis present

## 2024-09-22 DIAGNOSIS — K1120 Sialoadenitis, unspecified: Secondary | ICD-10-CM | POA: Diagnosis present

## 2024-09-22 DIAGNOSIS — Z803 Family history of malignant neoplasm of breast: Secondary | ICD-10-CM | POA: Diagnosis not present

## 2024-09-22 DIAGNOSIS — Z8249 Family history of ischemic heart disease and other diseases of the circulatory system: Secondary | ICD-10-CM | POA: Diagnosis not present

## 2024-09-22 DIAGNOSIS — N950 Postmenopausal bleeding: Secondary | ICD-10-CM | POA: Diagnosis present

## 2024-09-22 DIAGNOSIS — A4189 Other specified sepsis: Secondary | ICD-10-CM | POA: Diagnosis present

## 2024-09-22 DIAGNOSIS — T380X5A Adverse effect of glucocorticoids and synthetic analogues, initial encounter: Secondary | ICD-10-CM | POA: Diagnosis present

## 2024-09-22 DIAGNOSIS — G4733 Obstructive sleep apnea (adult) (pediatric): Secondary | ICD-10-CM | POA: Diagnosis present

## 2024-09-22 DIAGNOSIS — F1410 Cocaine abuse, uncomplicated: Secondary | ICD-10-CM | POA: Diagnosis present

## 2024-09-22 DIAGNOSIS — B9789 Other viral agents as the cause of diseases classified elsewhere: Secondary | ICD-10-CM | POA: Diagnosis present

## 2024-09-22 DIAGNOSIS — E785 Hyperlipidemia, unspecified: Secondary | ICD-10-CM | POA: Diagnosis present

## 2024-09-22 DIAGNOSIS — R0603 Acute respiratory distress: Secondary | ICD-10-CM | POA: Diagnosis not present

## 2024-09-22 DIAGNOSIS — Z8419 Family history of other disorders of kidney and ureter: Secondary | ICD-10-CM | POA: Diagnosis not present

## 2024-09-22 LAB — I-STAT CG4 LACTIC ACID, ED: Lactic Acid, Venous: 3.1 mmol/L (ref 0.5–1.9)

## 2024-09-22 LAB — BASIC METABOLIC PANEL WITH GFR
Anion gap: 12 (ref 5–15)
BUN: 11 mg/dL (ref 8–23)
CO2: 24 mmol/L (ref 22–32)
Calcium: 9.4 mg/dL (ref 8.9–10.3)
Chloride: 105 mmol/L (ref 98–111)
Creatinine, Ser: 0.86 mg/dL (ref 0.44–1.00)
GFR, Estimated: >60 mL/min (ref >60)
Glucose, Bld: 115 mg/dL — ABNORMAL HIGH (ref 70–99)
Potassium: 3.6 mmol/L (ref 3.5–5.1)
Sodium: 141 mmol/L (ref 135–145)

## 2024-09-22 LAB — RESPIRATORY PANEL BY PCR

## 2024-09-22 LAB — CBC
HCT: 41.2 % (ref 36.0–46.0)
Hemoglobin: 12.7 g/dL (ref 12.0–15.0)
MCH: 27.1 pg (ref 26.0–34.0)
MCHC: 30.8 g/dL (ref 30.0–36.0)
MCV: 87.8 fL (ref 80.0–100.0)
Platelets: 283 K/uL (ref 150–400)
RBC: 4.69 MIL/uL (ref 3.87–5.11)
RDW: 13.3 % (ref 11.5–15.5)
WBC: 14.5 K/uL — ABNORMAL HIGH (ref 4.0–10.5)
nRBC: 0 % (ref 0.0–0.2)

## 2024-09-22 LAB — LACTIC ACID, PLASMA
Lactic Acid, Venous: 3.6 mmol/L (ref 0.5–1.9)
Lactic Acid, Venous: 2.9 mmol/L (ref 0.5–1.9)

## 2024-09-22 MED ORDER — SODIUM CHLORIDE 0.9 % IV BOLUS
500.0000 mL | Freq: Once | INTRAVENOUS | Status: AC
  Administered 2024-09-22: 500 mL via INTRAVENOUS

## 2024-09-22 MED ORDER — DOXYCYCLINE HYCLATE 100 MG PO TABS
100.0000 mg | ORAL_TABLET | Freq: Two times a day (BID) | ORAL | Status: DC
  Administered 2024-09-22 – 2024-09-24 (×5): 100 mg via ORAL
  Filled 2024-09-22 (×5): qty 1

## 2024-09-22 MED ORDER — IPRATROPIUM-ALBUTEROL 0.5-2.5 (3) MG/3ML IN SOLN
3.0000 mL | Freq: Four times a day (QID) | RESPIRATORY_TRACT | Status: DC
  Administered 2024-09-22 – 2024-09-23 (×3): 3 mL via RESPIRATORY_TRACT
  Filled 2024-09-22 (×4): qty 3

## 2024-09-22 MED ORDER — LORAZEPAM 2 MG/ML IJ SOLN
0.5000 mg | INTRAMUSCULAR | Status: DC | PRN
  Administered 2024-09-22: 0.5 mg via INTRAVENOUS
  Filled 2024-09-22: qty 1

## 2024-09-22 MED ORDER — KETOROLAC TROMETHAMINE 30 MG/ML IJ SOLN
30.0000 mg | Freq: Four times a day (QID) | INTRAMUSCULAR | Status: DC | PRN
  Administered 2024-09-22 – 2024-09-24 (×6): 30 mg via INTRAVENOUS
  Filled 2024-09-22 (×6): qty 1

## 2024-09-22 MED ORDER — ALBUTEROL SULFATE (2.5 MG/3ML) 0.083% IN NEBU: 2.5000 mg | INHALATION_SOLUTION | RESPIRATORY_TRACT | Status: DC | PRN

## 2024-09-22 MED ORDER — LORATADINE 10 MG PO TABS
10.0000 mg | ORAL_TABLET | Freq: Every day | ORAL | Status: DC
  Administered 2024-09-22 – 2024-09-24 (×3): 10 mg via ORAL
  Filled 2024-09-22 (×3): qty 1

## 2024-09-22 NOTE — Progress Notes (Addendum)
 Progress Note   Patient: Kathleen [name] FMW:[id number] DOB: 07/21/1961 DOA: 09/21/2024     0 DOS: the patient was seen and examined on 09/22/2024   Brief hospital course: TULIP MEHARG is a 63 y.o. female with medical history significant for COPD/asthma overlap syndrome, HLD, depression/anxiety, OSA not on CPAP, polysubstance use (tobacco, cocaine, THC) who is admitted with respiratory distress due to COPD exacerbation. Found to have rhinovirus. Treated overnight with BIPAP, transitioned to [place] O2.       Assessment and Plan:  COPD exacerbation with Sepsis physiology in the setting of rhinovirus infection Patient with SOB x 24+ hours in the setting of exposure to her sick grandchild Fever to 101.4, tachycardia, tachypnea, leukocytosis with lactic acid elevation, c/w sepsis Chest x-ray negative Treated with IV Solu-Medrol , Pulmicort  and Brovana  nebulizations Change DuoNebs to standing q6h with prn Albuterol  q2h On Trelegy as an outpatient, will hold Given IV azithromycin  x 1 but completed recent course; will change to doxycycline  Added flutter valve, incentive spirometry Mucinex  prn Recommend outpatient follow-up with primary pulmonologist Dr. Annella unless inpatient pulm consultation is needed sooner Strict tobacco cessation is crucial Reported diffuse pain, reluctant to give narcotics in the setting of respiratory compromise so will order Toradol    Tobacco use d/o Continue nicotine  patch Reports no smoking since last hospitalization but appears to acknowledge smoking on further discussion Needs absolute cessation   Cocaine abuse  Went to rehab in August in [place]  UDS 08/18/2024: Positive for cocaine UDS again positive for cocaine on 10/10 Patient is aware that she needs to quit   Anxiety/depression Continue as needed hydroxyzine  Will add Ativan  for now   Seasonal allergies Continue Claritin    Hyperglycemia, DM ruled out A1c 9/23: 4.9 Likely related to steroids    Sialoadenitis Had office visit scheduled at [facility] on 9/25 Appointment moved to 11/18 with Dr. Jessee at [facility]   OSA Does not use CPAP   Postmenopausal bleeding Unremarkable recent US  Still reports vaginal bleeding Needs outpatient GYN follow up with E[facility] as soon as possible           Consultants: TOC team   Procedures: None   Antibiotics: Azithromycin  x 1 Ceftriaxone  x 1 Doxycycline  10/12-      Subjective: Hungry, wants pain medication.  Reluctantly acknowledges smoking and cocaine use.  Reports exposure to her sick grandson and then development of symptoms.  Physical Exam: Vitals:   09/22/24 1000 09/22/24 1130 09/22/24 1226 09/22/24 1324  BP: 118/87 133/79 124/80   Pulse: 94 93 99   Resp: (!) 23 20 20    Temp: 98 F (36.7 C)  98.6 F (37 C)   TempSrc: Oral  Oral   SpO2: 92% 96% 97%   Weight:    65.9 kg  Height:    5' 3 (1.6 m)     Intake/Output Summary (Last 24 hours) at 09/22/2024 1412 Last data filed at 09/22/2024 1330 Gross per 24 hour  Intake 1200 ml  Output 250 ml  Net 950 ml   Filed Weights   09/22/24 1324  Weight: 65.9 kg    Exam:  General:  Appears calm but with increased respiratory effort, anxiety Eyes:  normal lids, iris ENT:  grossly normal hearing, lips & tongue, mmm Cardiovascular:  RRR. No LE edema.  Respiratory:   Diffuse wheezes, poor air movement.  Mildly to moderately increased respiratory effort on [place] O2. Abdomen:  soft, NT, ND Skin:  no rash or induration seen on limited exam Musculoskeletal:  grossly normal tone BUE/BLE, good ROM, no bony abnormality Psychiatric:  blunted/anxious mood and affect, speech fluent and appropriate, AOx3 Neurologic:  CN 2-12 grossly intact, moves all extremities in coordinated fashion  Data Reviewed: I have reviewed the patient's lab results since admission.  Pertinent labs for today include:   Normal CMP Lactate 4 Unremarkable CBC COVID/flu/RSV negative UDS + cocaine    Family  Communication: None present  Disposition: Status is: Observation The patient remains OBS appropriate and will d/c before 2 midnights.  Planned Discharge Destination: Home    Time spent: 50 minutes  Author: Delon Herald, MD 09/22/2024 2:12 PM  For on call review [email or web address].

## 2024-09-22 NOTE — ED Notes (Signed)
 Patient refused lactic acid

## 2024-09-22 NOTE — Progress Notes (Signed)
   09/22/24 2322  BiPAP/CPAP/SIPAP  Reason BIPAP/CPAP not in use Non-compliant (PATIENT DOES NOT WANT TO WEAR RIGHT NOW)

## 2024-09-22 NOTE — ED Notes (Signed)
 Patient self removed IV fluids running on pump

## 2024-09-22 NOTE — Plan of Care (Signed)

## 2024-09-23 DIAGNOSIS — J441 Chronic obstructive pulmonary disease with (acute) exacerbation: Secondary | ICD-10-CM | POA: Diagnosis not present

## 2024-09-23 LAB — CBC
HCT: 44.5 % (ref 36.0–46.0)
Hemoglobin: 13.1 g/dL (ref 12.0–15.0)
MCH: 26.8 pg (ref 26.0–34.0)
MCHC: 29.4 g/dL — ABNORMAL LOW (ref 30.0–36.0)
MCV: 91 fL (ref 80.0–100.0)
Platelets: 263 K/uL (ref 150–400)
RBC: 4.89 MIL/uL (ref 3.87–5.11)
RDW: 13.3 % (ref 11.5–15.5)
WBC: 16.3 K/uL — ABNORMAL HIGH (ref 4.0–10.5)
nRBC: 0 % (ref 0.0–0.2)

## 2024-09-23 LAB — URINE CULTURE

## 2024-09-23 LAB — BASIC METABOLIC PANEL WITH GFR
Anion gap: 10 (ref 5–15)
BUN: 13 mg/dL (ref 8–23)
CO2: 25 mmol/L (ref 22–32)
Calcium: 9.2 mg/dL (ref 8.9–10.3)
Chloride: 106 mmol/L (ref 98–111)
Creatinine, Ser: 0.81 mg/dL (ref 0.44–1.00)
GFR, Estimated: >60 mL/min (ref >60)
Glucose, Bld: 93 mg/dL (ref 70–99)
Potassium: 4.3 mmol/L (ref 3.5–5.1)
Sodium: 141 mmol/L (ref 135–145)

## 2024-09-23 LAB — LACTIC ACID, PLASMA: Lactic Acid, Venous: 2.4 mmol/L (ref 0.5–1.9)

## 2024-09-23 MED ORDER — IPRATROPIUM-ALBUTEROL 0.5-2.5 (3) MG/3ML IN SOLN
3.0000 mL | RESPIRATORY_TRACT | Status: DC
Start: 2024-09-23 — End: 2024-09-24
  Administered 2024-09-23 – 2024-09-24 (×6): 3 mL via RESPIRATORY_TRACT
  Filled 2024-09-23 (×7): qty 3

## 2024-09-23 MED ORDER — HYDROXYZINE HCL 25 MG PO TABS
25.0000 mg | ORAL_TABLET | ORAL | Status: DC | PRN
Start: 2024-09-23 — End: 2024-09-24
  Administered 2024-09-23: 25 mg via ORAL
  Filled 2024-09-23: qty 1

## 2024-09-23 MED ORDER — METHOCARBAMOL 1000 MG/10ML IJ SOLN
500.0000 mg | Freq: Four times a day (QID) | INTRAMUSCULAR | Status: DC | PRN
  Administered 2024-09-24: 500 mg via INTRAVENOUS
  Filled 2024-09-23: qty 10

## 2024-09-23 MED ORDER — MELATONIN 5 MG PO TABS
5.0000 mg | ORAL_TABLET | Freq: Every evening | ORAL | Status: DC | PRN
  Administered 2024-09-23: 5 mg via ORAL
  Filled 2024-09-23: qty 1

## 2024-09-23 MED ORDER — GUAIFENESIN-DM 100-10 MG/5ML PO SYRP
5.0000 mL | ORAL_SOLUTION | ORAL | Status: DC | PRN
  Administered 2024-09-23: 5 mL via ORAL
  Filled 2024-09-23: qty 10

## 2024-09-23 NOTE — Plan of Care (Signed)
  Problem: Education: Goal: Knowledge of General Education information will improve Description: Including pain rating scale, medication(s)/side effects and non-pharmacologic comfort measures 09/23/2024 0848 by Rosanne Elspeth HERO, RN Outcome: Progressing 09/23/2024 0848 by Rosanne Elspeth HERO, RN Outcome: Progressing   Problem: Health Behavior/Discharge Planning: Goal: Ability to manage health-related needs will improve 09/23/2024 0848 by Rosanne Elspeth HERO, RN Outcome: Progressing 09/23/2024 0848 by Rosanne Elspeth HERO, RN Outcome: Progressing   Problem: Clinical Measurements: Goal: Ability to maintain clinical measurements within normal limits will improve 09/23/2024 0848 by Rosanne Elspeth HERO, RN Outcome: Progressing 09/23/2024 0848 by Rosanne Elspeth HERO, RN Outcome: Progressing Goal: Will remain free from infection 09/23/2024 0848 by Rosanne Elspeth HERO, RN Outcome: Progressing 09/23/2024 0848 by Rosanne Elspeth HERO, RN Outcome: Progressing Goal: Diagnostic test results will improve 09/23/2024 0848 by Rosanne Elspeth HERO, RN Outcome: Progressing 09/23/2024 0848 by Rosanne Elspeth HERO, RN Outcome: Progressing Goal: Respiratory complications will improve 09/23/2024 0848 by Rosanne Elspeth HERO, RN Outcome: Progressing 09/23/2024 0848 by Rosanne Elspeth HERO, RN Outcome: Progressing Goal: Cardiovascular complication will be avoided 09/23/2024 0848 by Rosanne Elspeth HERO, RN Outcome: Progressing 09/23/2024 0848 by Rosanne Elspeth HERO, RN Outcome: Progressing   Problem: Activity: Goal: Risk for activity intolerance will decrease 09/23/2024 0848 by Rosanne Elspeth HERO, RN Outcome: Progressing 09/23/2024 0848 by Rosanne Elspeth HERO, RN Outcome: Progressing   Problem: Nutrition: Goal: Adequate nutrition will be maintained 09/23/2024 0848 by Rosanne Elspeth HERO, RN Outcome: Progressing 09/23/2024 0848 by Rosanne Elspeth HERO, RN Outcome:  Progressing   Problem: Coping: Goal: Level of anxiety will decrease 09/23/2024 0848 by Rosanne Elspeth HERO, RN Outcome: Progressing 09/23/2024 0848 by Rosanne Elspeth HERO, RN Outcome: Progressing   Problem: Elimination: Goal: Will not experience complications related to bowel motility 09/23/2024 0848 by Rosanne Elspeth HERO, RN Outcome: Progressing 09/23/2024 0848 by Rosanne Elspeth HERO, RN Outcome: Progressing Goal: Will not experience complications related to urinary retention 09/23/2024 0848 by Rosanne Elspeth HERO, RN Outcome: Progressing 09/23/2024 0848 by Rosanne Elspeth HERO, RN Outcome: Progressing   Problem: Pain Managment: Goal: General experience of comfort will improve and/or be controlled 09/23/2024 0848 by Rosanne Elspeth HERO, RN Outcome: Progressing 09/23/2024 0848 by Rosanne Elspeth HERO, RN Outcome: Progressing   Problem: Safety: Goal: Ability to remain free from injury will improve 09/23/2024 0848 by Rosanne Elspeth HERO, RN Outcome: Progressing 09/23/2024 0848 by Rosanne Elspeth HERO, RN Outcome: Progressing   Problem: Skin Integrity: Goal: Risk for impaired skin integrity will decrease 09/23/2024 0848 by Rosanne Elspeth HERO, RN Outcome: Progressing 09/23/2024 0848 by Rosanne Elspeth HERO, RN Outcome: Progressing

## 2024-09-23 NOTE — Progress Notes (Signed)
   09/23/24 2321  BiPAP/CPAP/SIPAP  Reason BIPAP/CPAP not in use Non-compliant (PATIENT REFUSES. NOT IN ROOM)

## 2024-09-23 NOTE — Progress Notes (Signed)
 Progress Note   Patient: Kathleen [name] FMW:[id number] DOB: 1961-09-26 DOA: 09/21/2024     1 DOS: the patient was seen and examined on 09/23/2024   Brief hospital course: Kathleen [name] is a 63 y.o. female with medical history significant for COPD/asthma overlap syndrome, HLD, depression/anxiety, OSA not on CPAP, polysubstance use (tobacco, cocaine, THC) who is admitted with respiratory distress due to COPD exacerbation. Found to have rhinovirus. Treated overnight with BIPAP, transitioned to [place] O2.    Assessment and Plan:  COPD exacerbation with sepsis physiology in the setting of rhinovirus infection Patient with SOB x 24+ hours in the setting of exposure to her sick grandchild Fever to 101.4, tachycardia, tachypnea, leukocytosis with lactic acid elevation, c/w sepsis Chest x-ray negative Treated with IV Solu-Medrol , Pulmicort  and Brovana  nebulizations Change DuoNebs to standing q4h (at her request) with prn Albuterol  q2h On Trelegy as an outpatient, will hold Given IV azithromycin  x 1 but completed recent course; will give doxycycline  this time Added flutter valve, incentive spirometry Mucinex  prn Recommend outpatient follow-up with primary pulmonologist Dr. Annella unless inpatient pulm consultation is needed sooner Strict tobacco cessation is crucial Reported diffuse pain, reluctant to give narcotics so will order Toradol  and Robaxin  (IV at her request)   Tobacco use d/o Continue nicotine  patch Reports no smoking since last hospitalization but appears to acknowledge smoking on further discussion Needs absolute cessation   Cocaine abuse  Went to rehab in August in [place]  UDS 08/18/2024: Positive for cocaine UDS again positive for cocaine on 10/10 Patient is aware that she needs to quit Avoid narcotic pain meds, if possible   Anxiety/depression Continue as needed hydroxyzine , increase to 25 mg q4h prn   Seasonal allergies Continue Claritin    Hyperglycemia, DM ruled  out A1c 9/23: 4.9 Likely related to steroids   Sialoadenitis Had office visit scheduled at [facility] on 9/25 Appointment moved to 11/18 with Dr. Jessee at [facility]   OSA Does not use CPAP   Postmenopausal bleeding Unremarkable recent US  Still reports vaginal bleeding Needs outpatient GYN follow up with E[facility] as soon as possible           Consultants: TOC team   Procedures: None   Antibiotics: Azithromycin  x 1 Ceftriaxone  x 1 Doxycycline  10/12-    30 Day Unplanned Readmission Risk Score    Flowsheet Row ED to Hosp-Admission (Current) from 09/21/2024 in [place] 4TH FLOOR PROGRESSIVE CARE AND UROLOGY  30 Day Unplanned Readmission Risk Score (%) 20.06 Filed at 09/23/2024 0401    This score is the patient's risk of an unplanned readmission within 30 days of being discharged (0 -100%). The score is based on dignosis, age, lab data, medications, orders, and past utilization.   Low:  0-14.9   Medium: 15-21.9   High: 22-29.9   Extreme: 30 and above           Subjective: Reports anxiety, reports abdominal wall discomfort from coughing and moving around.  Breathing is mildly improved.   Objective: Vitals:   09/23/24 0836 09/23/24 1326  BP:  136/80  Pulse:  93  Resp:  (!) 22  Temp:  98.6 F (37 C)  SpO2: 99% 99%    Intake/Output Summary (Last 24 hours) at 09/23/2024 1346 Last data filed at 09/23/2024 0800 Gross per 24 hour  Intake 2062.87 ml  Output --  Net 2062.87 ml   Filed Weights   09/22/24 1324  Weight: 65.9 kg    Exam:  General:  Appears calm  but anxious Eyes:  normal lids, iris ENT:  grossly normal hearing, lips & tongue, mmm Cardiovascular:  RRR. No LE edema.  Respiratory:   Diffuse wheezes, poor but improving air movement.  Mildly increased respiratory effort on [place] O2 (wasn't wearing it when I walked into the room) Abdomen:  soft, NT, ND Skin:  no rash or induration seen on limited exam Musculoskeletal:  grossly normal tone BUE/BLE, good  ROM, no bony abnormality Psychiatric:  blunted/anxious mood and affect, speech fluent and appropriate, AOx3 Neurologic:  CN 2-12 grossly intact, moves all extremities in coordinated fashion  Data Reviewed: I have reviewed the patient's lab results since admission.  Pertinent labs for today include:   Normal BMP Lactate improved to 2.4 WBC 16.3 (steroids) Urine culture with GBS     Family Communication: None present      Code Status: Full Code   Disposition: Status is: Inpatient Remains inpatient appropriate because: ongoing monitoring     Time spent: 50 minutes  Unresulted Labs (From admission, onward)    None        Author: Delon Herald, MD 09/23/2024 1:46 PM  For on call review [email or web address].

## 2024-09-24 ENCOUNTER — Other Ambulatory Visit (HOSPITAL_COMMUNITY): Payer: Self-pay

## 2024-09-24 ENCOUNTER — Other Ambulatory Visit: Payer: Self-pay

## 2024-09-24 DIAGNOSIS — R0603 Acute respiratory distress: Secondary | ICD-10-CM

## 2024-09-24 MED ORDER — METHOCARBAMOL 500 MG PO TABS
500.0000 mg | ORAL_TABLET | Freq: Three times a day (TID) | ORAL | 0 refills | Status: DC | PRN
  Filled 2024-09-24 (×2): qty 30, 10d supply, fill #0

## 2024-09-24 MED ORDER — GUAIFENESIN ER 600 MG PO TB12
600.0000 mg | ORAL_TABLET | Freq: Two times a day (BID) | ORAL | 0 refills | Status: DC
  Filled 2024-09-24 (×2): qty 60, 30d supply, fill #0

## 2024-09-24 MED ORDER — CARMEX CLASSIC LIP BALM EX OINT
TOPICAL_OINTMENT | CUTANEOUS | Status: DC | PRN
  Administered 2024-09-24: 1 via TOPICAL
  Filled 2024-09-24: qty 10

## 2024-09-24 MED ORDER — PREDNISONE 10 MG PO TABS
ORAL_TABLET | ORAL | 0 refills | Status: AC
  Filled 2024-09-24: qty 9, 6d supply, fill #0

## 2024-09-24 MED ORDER — HYDROXYZINE HCL 25 MG PO TABS
25.0000 mg | ORAL_TABLET | Freq: Three times a day (TID) | ORAL | 0 refills | Status: DC | PRN
  Filled 2024-09-24 (×2): qty 30, 10d supply, fill #0

## 2024-09-24 MED ORDER — IPRATROPIUM-ALBUTEROL 0.5-2.5 (3) MG/3ML IN SOLN
3.0000 mL | RESPIRATORY_TRACT | 0 refills | Status: DC
  Filled 2024-09-24: qty 360, 20d supply, fill #0

## 2024-09-24 MED ORDER — ALBUTEROL SULFATE (2.5 MG/3ML) 0.083% IN NEBU
2.5000 mg | INHALATION_SOLUTION | RESPIRATORY_TRACT | 0 refills | Status: DC | PRN
  Filled 2024-09-24: qty 90, 3d supply, fill #0

## 2024-09-24 MED ORDER — DOXYCYCLINE HYCLATE 100 MG PO TABS
100.0000 mg | ORAL_TABLET | Freq: Two times a day (BID) | ORAL | 0 refills | Status: AC
  Filled 2024-09-24 (×2): qty 14, 7d supply, fill #0

## 2024-09-24 NOTE — TOC Initial Note (Signed)
 Transition of Care [facility]) - Initial/Assessment Note    Patient Details  Name: YOSHI VICENCIO MRN: [id number] Date of Birth: [birth year]  Transition of Care [facility]) CM/SW Contact:    Tawni CHRISTELLA Eva, LCSW Phone Number: 09/24/2024, 9:52 AM  Clinical Narrative:                  CSW received consult for Medication assistance. Per chart review pt is insured with a managed Medicaid plan. Pt does not qualify for medication assistance through [facility] program.   Expected Discharge Plan: Home/Self Care Barriers to Discharge: Continued Medical Work up   Patient Goals and CMS Choice            Expected Discharge Plan and Services                                              Prior Living Arrangements/Services                       Activities of Daily Living   ADL Screening (condition at time of admission) Independently performs ADLs?: Yes (appropriate for developmental age) Is the patient deaf or have difficulty hearing?: No Does the patient have difficulty seeing, even when wearing glasses/contacts?: Yes Does the patient have difficulty concentrating, remembering, or making decisions?: No  Permission Sought/Granted                  Emotional Assessment              Admission diagnosis:  COPD exacerbation (HCC) [J44.1] COPD with acute exacerbation (HCC) [J44.1] Patient Active Problem List   Diagnosis Date Noted   Rhinovirus infection 09/22/2024   Polysubstance use disorder 09/21/2024   Post-menopause bleeding 08/22/2024   Respiratory distress secondary to COPD exacerbation 08/18/2024   left Metacarpal bone fracture 08/18/2024   Bipolar 1 disorder (HCC) 08/18/2024   Cocaine abuse (HCC) 06/15/2024   COPD with acute exacerbation (HCC) 06/14/2024   Urinary incontinence 03/07/2024   Cervical radiculopathy 05/22/2023   Chronic back pain 03/08/2023   OSA (obstructive sleep apnea) 03/08/2023   COPD exacerbation (HCC) 01/30/2023   Syncope and  collapse 01/30/2023   Malnutrition of moderate degree 11/17/2022   Marijuana abuse 08/17/2022   Weight loss 07/07/2022   Sialoadenitis 03/16/2022   Skin growth 08/28/2021   Dysuria 08/28/2021   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   Daytime somnolence 04/06/2019   Lumbar radiculopathy 07/26/2016   Depression 09/19/2015   Healthcare maintenance 06/18/2014   Tobacco abuse 03/13/2013   GERD 01/21/2010   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 [place] [address] Phone: [phone] Fax: [phone]     Social Drivers of Health (SDOH) Social History: SDOH Screenings   Food Insecurity: No Food Insecurity (09/22/2024)  Housing: Low Risk  (09/22/2024)  Recent Concern: Housing - High Risk (08/18/2024)  Transportation Needs: No Transportation Needs (09/22/2024)  Utilities: Not At Risk (09/22/2024)  Alcohol Screen: Low Risk  (05/19/2023)  Depression (PHQ2-9): High Risk (05/19/2023)  Financial Resource Strain: Medium Risk (05/19/2023)  Physical Activity: Sufficiently Active (05/19/2023)  Social Connections: Unknown (09/22/2024)  Stress: Stress Concern Present (05/19/2023)  Tobacco Use: High Risk (  09/22/2024)   SDOH Interventions:     Readmission Risk Interventions    08/20/2024    4:08 PM 04/08/2023    2:51 PM 03/08/2023    4:05 PM  Readmission Risk Prevention Plan  Transportation Screening Complete  Complete  PCP or Specialist Appt within 3-5 Days Complete  Complete  HRI or Home Care Consult Complete  Complete  Social Work Consult for Recovery Care Planning/Counseling Complete  Complete  Palliative Care Screening Not Applicable  Not Applicable  Medication Review Oceanographer) Complete  Complete  PCP or Specialist appointment within 3-5 days of discharge     HRI or  Home Care Consult     SW Recovery Care/Counseling Consult     Palliative Care Screening     Skilled Nursing Facility        Information is confidential and restricted. Go to Review Flowsheets to unlock data.

## 2024-09-24 NOTE — Progress Notes (Signed)
 Discharge meds in  a secure bag delivered to patient in room. Pt requested duo nebs be added - request sent to Dr Barbarann who sent script to [facility]

## 2024-09-24 NOTE — Progress Notes (Signed)
 SATURATION QUALIFICATIONS: (This note is used to comply with regulatory documentation for home oxygen )  Patient Saturations on Room Air at Rest = 97%  Patient Saturations on Room Air while Ambulating = 95%  Patient Saturations on 0 Liters of oxygen  while Ambulating = 95%  Patient's Saturations on Room Air when returning back to the room dropped down to 93%. Patient complained of shortness of breath when returning back to the room. Patient was placed back on 2 liters of oxygen  to help with the ease of breathing/shortness of breath.   Please briefly explain why patient needs home oxygen : Patient does not need any home oxygen  but has been wearing oxygen  while here in the hospital.

## 2024-09-24 NOTE — Discharge Summary (Addendum)
 Physician Discharge Summary   Patient: Kathleen [name] MRN: [id number] DOB: 06-02-1961  Admit date:     09/21/2024  Discharge date: 09/24/24  Discharge Physician: Delon Herald   PCP: Renne Homans, MD   Recommendations at discharge:   Complete antibiotics - Doxycycline  twice daily x 7 days Do not resume smoking; nicotine  patch prescribed Do not use cocaine Take prednisone  taper as prescribed Add Brovana  (aformoterol) and Pulmicort  (budesonide ) breathing treatments twice a day Nebulizer machine and albuterol  also prescribed Follow up with Dr. Annella; referral placed Follow up with Dr. Amoako in 1-2 weeks; call for an appointment Follow up with GYN for an endometrial biopsy; referral placed Follow up with [facility] for stone removal  Discharge Diagnoses: Principal Problem:   Respiratory distress secondary to COPD exacerbation Active Problems:   Post-menopause bleeding   COPD with acute exacerbation (HCC)   Cocaine abuse (HCC)   OSA (obstructive sleep apnea)   Tobacco abuse   Sialoadenitis   Rhinovirus infection   Hospital Course: Kathleen [name] is a 63 y.o. female with medical history significant for COPD/asthma overlap syndrome, HLD, depression/anxiety, OSA not on CPAP, polysubstance use (tobacco, cocaine, THC) who is admitted with respiratory distress due to COPD exacerbation. Found to have rhinovirus. Treated overnight with BIPAP, transitioned to [place] O2.    Assessment and Plan:  COPD exacerbation with sepsis physiology in the setting of rhinovirus infection Patient with SOB x 24+ hours in the setting of exposure to her sick grandchild Fever to 101.4, tachycardia, tachypnea, leukocytosis with lactic acid elevation, c/w sepsis Chest x-ray negative Treated with IV Solu-Medrol , Pulmicort  and Brovana  nebulizations Change DuoNebs to standing q4h (at her request) with prn Albuterol  q2h On Trelegy as an outpatient, will hold Given IV azithromycin  x 1 but completed recent  course; will give doxycycline  this time Added flutter valve, incentive spirometry Mucinex  prn Recommend outpatient follow-up with primary pulmonologist Dr. Annella unless inpatient pulm consultation is needed sooner Strict tobacco cessation is crucial Reported diffuse pain, reluctant to give narcotics so will order Toradol  and Robaxin  (IV at her request)   Tobacco use d/o Continue nicotine  patch Reports no smoking since last hospitalization but appears to acknowledge smoking on further discussion Needs absolute cessation   Cocaine abuse  Went to rehab in August in [place]  UDS 08/18/2024: Positive for cocaine UDS again positive for cocaine on 10/10 Patient is aware that she needs to quit Avoid narcotic pain meds, if possible   Anxiety/depression Continue as needed hydroxyzine , increase to 25 mg q4h prn   Seasonal allergies Continue Claritin    Hyperglycemia, DM ruled out A1c 9/23: 4.9 Likely related to steroids   Sialoadenitis Had office visit scheduled at [facility] on 9/25 Appointment moved to 11/18 with Dr. Jessee at [facility]   OSA Does not use CPAP   Postmenopausal bleeding Unremarkable recent US  Still reports vaginal bleeding Needs outpatient GYN follow up with [facility] as soon as possible           Consultants: TOC team   Procedures: None   Antibiotics: Azithromycin  x 1 Ceftriaxone  x 10/10-13 Doxycycline  10/12-  Pain control - [place]  Controlled Substance Reporting System database was reviewed. and patient was instructed, not to drive, operate heavy machinery, perform activities at heights, swimming or participation in water  activities or provide baby-sitting services while on Pain, Sleep and Anxiety Medications; until their outpatient Physician has advised to do so again. Also recommended to not to take more than prescribed Pain, Sleep and Anxiety Medications.  Disposition: Home Diet recommendation:  Regular diet DISCHARGE MEDICATION: Allergies as  of 09/24/2024       Reactions   Bee Venom Anaphylaxis   Peach [prunus Persica] Anaphylaxis, Swelling, Other (See Comments)   Throat swells , breaks out   Topamax [topiramate] Other (See Comments)   Hallucinations    Tramadol Nausea Only        Medication List     TAKE these medications    albuterol  (2.5 MG/3ML) 0.083% nebulizer solution Commonly known as: PROVENTIL  Take 3 mLs (2.5 mg total) by nebulization every 2 (two) hours as needed for wheezing or shortness of breath. What changed: reasons to take this   arformoterol  15 MCG/2ML Nebu Commonly known as: BROVANA  Take 2 mLs (15 mcg total) by nebulization 2 (two) times daily.   budesonide  0.25 MG/2ML nebulizer solution Commonly known as: PULMICORT  Take 2 mLs (0.25 mg total) by nebulization 2 (two) times daily.   doxycycline  100 MG tablet Commonly known as: VIBRA -TABS Take 1 tablet (100 mg total) by mouth every 12 (twelve) hours for 7 days.   guaiFENesin  600 MG 12 hr tablet Commonly known as: MUCINEX  Take 1 tablet (600 mg total) by mouth 2 (two) times daily.   hydrOXYzine  25 MG tablet Commonly known as: ATARAX  Take 1 tablet (25 mg total) by mouth every 8 (eight) hours as needed for anxiety. What changed:  when to take this reasons to take this additional instructions   ipratropium-albuterol  0.5-2.5 (3) MG/3ML Soln Commonly known as: DUONEB Take 3 mLs by nebulization every 6 (six) hours. What changed: Another medication with the same name was added. Make sure you understand how and when to take each.   ipratropium-albuterol  0.5-2.5 (3) MG/3ML Soln Commonly known as: DUONEB Take 3 mLs by nebulization every 4 (four) hours. What changed: You were already taking a medication with the same name, and this prescription was added. Make sure you understand how and when to take each.   loratadine  10 MG tablet Commonly known as: Claritin  Take 1 tablet (10 mg total) by mouth daily.   methocarbamol  500 MG tablet Commonly  known as: ROBAXIN  Take 1 tablet (500 mg total) by mouth every 8 (eight) hours as needed for muscle spasms.   nicotine  14 mg/24hr patch Commonly known as: NICODERM CQ  - dosed in mg/24 hours Place 1 patch (14 mg total) onto the skin daily.   ondansetron  4 MG tablet Commonly known as: ZOFRAN  TAKE 1 TABLET BY MOUTH ONCE DAILY AS NEEDED FOR NAUSEA FOR VOMITING What changed: See the new instructions.   predniSONE  10 MG tablet Commonly known as: DELTASONE  Take 2 tablets (20 mg total) by mouth daily with breakfast for 3 days, THEN 1 tablet (10 mg total) daily with breakfast for 3 days. Start taking on: September 24, 2024   Trelegy Ellipta  200-62.5-25 MCG/ACT Aepb Generic drug: Fluticasone -Umeclidin-Vilant Inhale 2 puffs into the lungs daily as needed.               Durable Medical Equipment  (From admission, onward)           Start     Ordered   09/24/24 0000  For home use only DME Nebulizer machine       Question Answer Comment  Patient needs a nebulizer to treat with the following condition COPD (chronic obstructive pulmonary disease) (HCC)   Length of Need Lifetime   Additional equipment included Administration kit   Additional equipment included Filter      09/24/24 1156  Follow-up Information     Renne Homans, MD. Call in 1 week(s).   Specialty: Internal Medicine Contact information: [address] [place] [address] [phone]         Hunsucker, Donnice SAUNDERS, MD. Call in 2 week(s).   Specialty: Pulmonary Disease Contact information: [address] [place] [address] [phone]                Discharge Exam:    Subjective: Feeling better.  Not wearing O2 this AM.  Ambulated without O2 and did not drop below 93%.   Objective: Vitals:   09/24/24 1113 09/24/24 1303  BP:  121/81  Pulse:  (!) 105  Resp:  20  Temp: 99 F (37.2 C) 98.6 F (37 C)  SpO2:  97%    Intake/Output Summary  (Last 24 hours) at 09/24/2024 1433 Last data filed at 09/24/2024 1010 Gross per 24 hour  Intake 343 ml  Output --  Net 343 ml   Filed Weights   09/22/24 1324  Weight: 65.9 kg    Exam:  General:  Appears calm and in NAD Eyes:  normal lids, iris ENT:  grossly normal hearing, lips & tongue, mmm Cardiovascular:  RRR. No LE edema.  Respiratory:   Expiratory wheezes, significantly improved air movement.  Abdomen:  soft, NT, ND Skin:  no rash or induration seen on limited exam Musculoskeletal:  grossly normal tone BUE/BLE, good ROM, no bony abnormality Psychiatric:  blunted/anxious mood and affect, speech fluent and appropriate, AOx3 Neurologic:  CN 2-12 grossly intact, moves all extremities in coordinated fashion  Data Reviewed: I have reviewed the patient's lab results since admission.  Pertinent labs for today include:   None today    Condition at discharge: improving  The results of significant diagnostics from this hospitalization (including imaging, microbiology, ancillary and laboratory) are listed below for reference.   Imaging Studies: CT ABDOMEN PELVIS W CONTRAST Result Date: 09/21/2024 CLINICAL DATA:  Right lower quadrant abdominal pain. EXAM: CT ABDOMEN AND PELVIS WITH CONTRAST TECHNIQUE: Multidetector CT imaging of the abdomen and pelvis was performed using the standard protocol following bolus administration of intravenous contrast. RADIATION DOSE REDUCTION: This exam was performed according to the departmental dose-optimization program which includes automated exposure control, adjustment of the mA and/or kV according to patient size and/or use of iterative reconstruction technique. CONTRAST:  OMNIPAQUE  IOHEXOL  300 MG/ML  SOLN COMPARISON:  August 30, 2022 FINDINGS: Lower chest: No acute abnormality. Hepatobiliary: No focal liver abnormality is seen. No gallstones, gallbladder wall thickening, or biliary dilatation. Pancreas: Unremarkable. No pancreatic ductal  dilatation or surrounding inflammatory changes. Spleen: Normal in size without focal abnormality. Adrenals/Urinary Tract: Adrenal glands are unremarkable. Kidneys are normal, without renal calculi, focal lesion, or hydronephrosis. The urinary bladder is poorly distended and subsequently limited in evaluation. Stomach/Bowel: There is a small hiatal hernia. The appendix is not clearly identified. No evidence of bowel wall thickening, distention, or inflammatory changes. Vascular/Lymphatic: No significant vascular findings are present. No enlarged abdominal or pelvic lymph nodes. Reproductive: Uterus and bilateral adnexa are unremarkable. Other: No abdominal wall hernia or abnormality. No abdominopelvic ascites. Musculoskeletal: No acute or significant osseous findings. IMPRESSION: 1. Small hiatal hernia. 2. No acute or active process within the abdomen or pelvis. 3. Poorly visualized appendix. Correlation with follow-up abdomen and pelvis CT with oral contrast is recommended if appendicitis is of clinical concern. Electronically Signed   By: Suzen Dials M.D.   On: 09/21/2024 15:30  DG Chest Portable 1 View Result Date: 09/21/2024 EXAM: 1 VIEW(S) XRAY OF THE CHEST 09/21/2024 01:51:00 PM COMPARISON: 09/03/2024 CLINICAL HISTORY: respiratory distress. Per chart: C/o SOB, difficulty speaking, gasping for air. Hx COPD. FINDINGS: LUNGS AND PLEURA: No focal pulmonary opacity. No pulmonary edema. No pleural effusion. No pneumothorax. HEART AND MEDIASTINUM: No acute abnormality of the cardiac and mediastinal silhouettes. BONES AND SOFT TISSUES: No acute osseous abnormality. IMPRESSION: 1. No acute cardiopulmonary process. Electronically signed by: Lynwood Seip MD 09/21/2024 02:19 PM EDT RP Workstation: [id number]   DG Chest Port 1 View Result Date: 09/03/2024 CLINICAL DATA:  soband chest pain. EXAM: PORTABLE CHEST - 1 VIEW COMPARISON:  08/18/2024 FINDINGS: No focal airspace consolidation, pleural effusion, or  pneumothorax. No cardiomegaly.No acute fracture or destructive lesion. IMPRESSION: No acute cardiopulmonary abnormality. Electronically Signed   By: Rogelia Myers M.D.   On: 09/03/2024 17:01    Microbiology: Results for orders placed or performed during the hospital encounter of 09/21/24  Resp panel by RT-PCR (RSV, Flu A&B, Covid) Anterior Nasal Swab     Status: None   Collection Time: 09/21/24  1:19 PM   Specimen: Anterior Nasal Swab  Result Value Ref Range Status   SARS Coronavirus 2 by RT PCR NEGATIVE NEGATIVE Final    Comment: (NOTE) SARS-CoV-2 target nucleic acids are NOT DETECTED.  The SARS-CoV-2 RNA is generally detectable in upper respiratory specimens during the acute phase of infection. The lowest concentration of SARS-CoV-2 viral copies this assay can detect is 138 copies/mL. A negative result does not preclude SARS-Cov-2 infection and should not be used as the sole basis for treatment or other patient management decisions. A negative result may occur with  improper specimen collection/handling, submission of specimen other than nasopharyngeal swab, presence of viral mutation(s) within the areas targeted by this assay, and inadequate number of viral copies(<138 copies/mL). A negative result must be combined with clinical observations, patient history, and epidemiological information. The expected result is Negative.  Fact Sheet for Patients:  BloggerCourse.com  Fact Sheet for Healthcare Providers:  SeriousBroker.it  This test is no t yet approved or cleared by the [place]  FDA and  has been authorized for detection and/or diagnosis of SARS-CoV-2 by FDA under an Emergency Use Authorization (EUA). This EUA will remain  in effect (meaning this test can be used) for the duration of the COVID-19 declaration under Section 564(b)(1) of the Act, 21 U.S.C.section 360bbb-3(b)(1), unless the authorization is terminated  or  revoked sooner.       Influenza A by PCR NEGATIVE NEGATIVE Final   Influenza B by PCR NEGATIVE NEGATIVE Final    Comment: (NOTE) The Xpert Xpress SARS-CoV-2/FLU/RSV plus assay is intended as an aid in the diagnosis of influenza from Nasopharyngeal swab specimens and should not be used as a sole basis for treatment. Nasal washings and aspirates are unacceptable for Xpert Xpress SARS-CoV-2/FLU/RSV testing.  Fact Sheet for Patients: BloggerCourse.com  Fact Sheet for Healthcare Providers: SeriousBroker.it  This test is not yet approved or cleared by the [place]  FDA and has been authorized for detection and/or diagnosis of SARS-CoV-2 by FDA under an Emergency Use Authorization (EUA). This EUA will remain in effect (meaning this test can be used) for the duration of the COVID-19 declaration under Section 564(b)(1) of the Act, 21 U.S.C. section 360bbb-3(b)(1), unless the authorization is terminated or revoked.     Resp Syncytial Virus by PCR NEGATIVE NEGATIVE Final    Comment: (NOTE) Fact Sheet for Patients: BloggerCourse.com  Fact Sheet  for Healthcare Providers: SeriousBroker.it  This test is not yet approved or cleared by the [place]  FDA and has been authorized for detection and/or diagnosis of SARS-CoV-2 by FDA under an Emergency Use Authorization (EUA). This EUA will remain in effect (meaning this test can be used) for the duration of the COVID-19 declaration under Section 564(b)(1) of the Act, 21 U.S.C. section 360bbb-3(b)(1), unless the authorization is terminated or revoked.  Performed at [facility], [address], [place], [place] [address]   Respiratory (~20 pathogens) panel by PCR     Status: Abnormal   Collection Time: 09/21/24  1:19 PM   Specimen: Nasopharyngeal Swab; Respiratory  Result Value Ref Range Status   Adenovirus NOT  DETECTED NOT DETECTED Final   Coronavirus 229E NOT DETECTED NOT DETECTED Final    Comment: (NOTE) The Coronavirus on the Respiratory Panel, DOES NOT test for the novel  Coronavirus (2019 nCoV)    Coronavirus HKU1 NOT DETECTED NOT DETECTED Final   Coronavirus NL63 NOT DETECTED NOT DETECTED Final   Coronavirus OC43 NOT DETECTED NOT DETECTED Final   Metapneumovirus NOT DETECTED NOT DETECTED Final   Rhinovirus / Enterovirus DETECTED (A) NOT DETECTED Final   Influenza A NOT DETECTED NOT DETECTED Final   Influenza B NOT DETECTED NOT DETECTED Final   Parainfluenza Virus 1 NOT DETECTED NOT DETECTED Final   Parainfluenza Virus 2 NOT DETECTED NOT DETECTED Final   Parainfluenza Virus 3 NOT DETECTED NOT DETECTED Final   Parainfluenza Virus 4 NOT DETECTED NOT DETECTED Final   Respiratory Syncytial Virus NOT DETECTED NOT DETECTED Final   Bordetella pertussis NOT DETECTED NOT DETECTED Final   Bordetella Parapertussis NOT DETECTED NOT DETECTED Final   Chlamydophila pneumoniae NOT DETECTED NOT DETECTED Final   Mycoplasma pneumoniae NOT DETECTED NOT DETECTED Final    Comment: Performed at [facility], [address] [place], [place] [address]  Blood culture (routine x 2)     Status: None (Preliminary result)   Collection Time: 09/21/24  6:30 PM   Specimen: BLOOD  Result Value Ref Range Status   Specimen Description   Final    BLOOD BLOOD RIGHT ARM Performed at [facility], [address], [place], [place] [address]    Special Requests   Final    BOTTLES DRAWN AEROBIC AND ANAEROBIC Blood Culture results may not be optimal due to an inadequate volume of blood received in culture bottles Performed at [facility], [address] [place], [place] [address]    Culture   Final    NO GROWTH 3 DAYS Performed at [facility], [address], [place], [place] [address]    Report Status PENDING  Incomplete  Urine Culture (for pregnant, neutropenic or  urologic patients or patients with an indwelling urinary catheter)     Status: Abnormal   Collection Time: 09/21/24 10:09 PM   Specimen: Urine, Clean Catch  Result Value Ref Range Status   Specimen Description   Final    URINE, CLEAN CATCH Performed at [facility], [address] [place], [place] [address]    Special Requests   Final    NONE Performed at [facility], [address], [place], [place] [address]    Culture (A)  Final    MULTIPLE SPECIES PRESENT, SUGGEST RECOLLECTION >=100,000 COLONIES/mL GROUP B STREP(S.AGALACTIAE)ISOLATED TESTING AGAINST S. AGALACTIAE NOT ROUTINELY PERFORMED DUE TO PREDICTABILITY OF AMP/PEN/VAN SUSCEPTIBILITY. Performed at [facility], [address] [place], [place] [address]    Report Status 09/23/2024 FINAL  Final    Labs: CBC: Recent Labs  Lab 09/21/24 1319 09/22/24 0859 09/23/24 0849  WBC 9.2 14.5* 16.3*  NEUTROABS 6.9  --   --   HGB 14.6 12.7 13.1  HCT 45.3 41.2 44.5  MCV 86.5 87.8 91.0  PLT 311 283 263   Basic Metabolic Panel: Recent Labs  Lab 09/21/24 1319 09/22/24 0859 09/23/24 0849  NA 139 141 141  K 3.8 3.6 4.3  CL 103 105 106  CO2 25 24 25   GLUCOSE 86 115* 93  BUN 8 11 13   CREATININE 0.95 0.86 0.81  CALCIUM  9.7 9.4 9.2   Liver Function Tests: Recent Labs  Lab 09/21/24 1319  AST 32  ALT 40  ALKPHOS 96  BILITOT 0.5  PROT 7.4  ALBUMIN 4.2   CBG: No results for input(s): GLUCAP in the last 168 hours.  Discharge time spent: greater than 30 minutes.  Signed: Delon Herald, MD [facility] 09/24/2024

## 2024-09-24 NOTE — Progress Notes (Signed)
 MD notified that patient was complaining of numbness in the fingertips of both hands. Patient told RN that it had been ongoing since yesterday of last night.

## 2024-09-24 NOTE — Progress Notes (Signed)
 Patient received discharge orders to go home. Patient was given discharge paperwork/instructions and discharge medications. RN went over discharge paperwork/instructions with the patient. Any questions/concerns were addressed/answered during this time to the best of RN's ability. Patient left the hospital stable, had discharge paperwork/instructions, had discharge medications, and had all personal belongings and home medication(s).

## 2024-09-24 NOTE — Progress Notes (Signed)
 Duonebs in  a secure bag delivered to patient in room by this RN

## 2024-09-26 LAB — CULTURE, BLOOD (ROUTINE X 2): Culture: NO GROWTH

## 2024-10-02 ENCOUNTER — Ambulatory Visit: Payer: MEDICAID | Admitting: Student

## 2024-10-02 VITALS — BP 127/91 | HR 101 | Temp 98.1°F | Ht 63.0 in | Wt 136.2 lb

## 2024-10-02 DIAGNOSIS — J441 Chronic obstructive pulmonary disease with (acute) exacerbation: Secondary | ICD-10-CM | POA: Diagnosis not present

## 2024-10-02 DIAGNOSIS — N939 Abnormal uterine and vaginal bleeding, unspecified: Secondary | ICD-10-CM

## 2024-10-02 DIAGNOSIS — K1120 Sialoadenitis, unspecified: Secondary | ICD-10-CM

## 2024-10-02 DIAGNOSIS — F32A Depression, unspecified: Secondary | ICD-10-CM

## 2024-10-02 DIAGNOSIS — J45901 Unspecified asthma with (acute) exacerbation: Secondary | ICD-10-CM

## 2024-10-02 DIAGNOSIS — F17210 Nicotine dependence, cigarettes, uncomplicated: Secondary | ICD-10-CM | POA: Diagnosis not present

## 2024-10-02 DIAGNOSIS — N950 Postmenopausal bleeding: Secondary | ICD-10-CM

## 2024-10-02 DIAGNOSIS — Z79899 Other long term (current) drug therapy: Secondary | ICD-10-CM

## 2024-10-02 DIAGNOSIS — F411 Generalized anxiety disorder: Secondary | ICD-10-CM

## 2024-10-02 MED ORDER — NICOTINE 14 MG/24HR TD PT24: 14.0000 mg | MEDICATED_PATCH | Freq: Every day | TRANSDERMAL | 0 refills | Status: DC

## 2024-10-02 MED ORDER — LORATADINE 10 MG PO TABS: 10.0000 mg | ORAL_TABLET | Freq: Every day | ORAL | 11 refills | Status: DC

## 2024-10-02 MED ORDER — HYDROXYZINE HCL 25 MG PO TABS: 25.0000 mg | ORAL_TABLET | Freq: Three times a day (TID) | ORAL | 0 refills | Status: DC | PRN

## 2024-10-02 MED ORDER — TRELEGY ELLIPTA 200-62.5-25 MCG/ACT IN AEPB: 2.0000 | INHALATION_SPRAY | Freq: Every day | RESPIRATORY_TRACT | 3 refills | Status: DC

## 2024-10-02 NOTE — Assessment & Plan Note (Signed)
 Follows with [facility], SAR, no appointment 11/18  Postmenopausal bleeding - Gynecology appointment.

## 2024-10-02 NOTE — Progress Notes (Unsigned)
 CC: Hospital follow-up  HPI:  Kathleen [name] is a 63 y.o. female living with a history stated below and presents today for hospital follow-up.  She was admitted between 10/10-10/13 for COPD exacerbation, discharged with 7 days of doxycycline , Brovana  and Pulmicort  inhalers.  Ask about breathing:   Recent cocaine use    Patient was last seen in resident [facility] on ***   Please see problem based assessment and plan for additional details.  Past Medical History:  Diagnosis Date   Acute respiratory failure with hypoxia and hypercapnia (HCC) 03/08/2023   Anxiety    Arthritis    hands   Asthma    2 sets of PFT's in 04/09 without sign variability. Last set with significant decrease in FEV1 with saline alone, suggesting Asthma but  recommended clinical corelation    Asthma    Asthma exacerbation 04/07/2023   Bipolar 1 disorder [facility])    therapist is Joy and is followed by [employer] [facility]   Blackout    negative work up including ESR, ANA, opthalmology referral, carotid dopplers, 2D echo, MRI and EEG.   BREAST LUMP 03/25/2008   Annotation: bilaterally Qualifier: Diagnosis of  By: Rogerio MD, Starlyn BIRCH.    Breast mass in female    s/p mammogram, u/s and biopsy in 05/09 c/w fibroadenoma,   Bronchitis    Chronic headache    Chronic low back pain 08/08/2008   Qualifier: Diagnosis of  By: Tanda  MD, Valerie     Chronic pain    normal work up including TSH, RPR, B12, HIV, plain films, 2 ESR's, ANA, CK, RF along with routine CBC, CMET and UA. Further work up includes CRP, ESR, SPEP/UPEP, hepatitis erology, A1C , repeat ANA   COPD (chronic obstructive pulmonary disease) (HCC)    COPD exacerbation (HCC) 03/31/2019   Depression    DUB (dysfunctional uterine bleeding)    and pelvic pain, with negative endometrial bx in 07/09 and transvaginal U/S significant for mild fibroids in 0/09.   Emphysema of lung (HCC)    GERD (gastroesophageal reflux disease)    Hallucinations  03/18/2008   Qualifier: Diagnosis of  By: Tanda  MD, Berwyn     Hyperlipidemia    Lower extremity edema    Neg ABI's, normal 2D echo, normal albumin   Menorrhagia    Ovarian cyst    Polysubstance abuse (HCC)    none since March 17,2009.   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 90 mL 0   arformoterol  (BROVANA ) 15 MCG/2ML NEBU Take 2 mLs (15 mcg total) by nebulization 2 (two) times daily. 120 mL 0   budesonide  (PULMICORT ) 0.25 MG/2ML nebulizer solution Take 2 mLs (0.25 mg total) by nebulization 2 (two) times daily. 60 mL 12   Fluticasone -Umeclidin-Vilant (TRELEGY ELLIPTA ) 200-62.5-25 MCG/ACT AEPB Inhale 2 puffs into the lungs daily as needed.     guaiFENesin  (MUCINEX ) 600 MG 12 hr tablet Take 1 tablet (600 mg total) by mouth 2 (two) times daily. 60 tablet 0   hydrOXYzine  (ATARAX ) 25 MG tablet Take 1 tablet (25 mg total) by mouth  every 8 (eight) hours as needed for anxiety. 30 tablet 0   ipratropium-albuterol  (DUONEB) 0.5-2.5 (3) MG/3ML SOLN Take 3 mLs by nebulization every 6 (six) hours. 360 mL 0   ipratropium-albuterol  (DUONEB) 0.5-2.5 (3) MG/3ML SOLN Take 3 mLs by nebulization every 4 (four) hours. 360 mL 0   loratadine  (CLARITIN ) 10 MG tablet Take 1 tablet (10 mg total) by mouth daily. 30 tablet 11   methocarbamol  (ROBAXIN ) 500 MG tablet Take 1 tablet (500 mg total) by mouth every 8 (eight) hours as needed for muscle spasms. 30 tablet 0   nicotine  (NICODERM CQ  - DOSED IN MG/24 HOURS) 14 mg/24hr patch Place 1 patch (14 mg total) onto the skin daily. 28 patch 0   ondansetron  (ZOFRAN ) 4 MG tablet TAKE 1 TABLET BY MOUTH ONCE DAILY AS NEEDED  FOR NAUSEA FOR VOMITING (Patient taking differently: Take 4 mg by mouth daily as needed for vomiting or nausea.) 30 tablet 0   No current facility-administered medications on file prior to visit.    Review of Systems: ROS negative except for what is noted on the assessment and plan.  There were no vitals filed for this visit. {Labs (Optional):23779} {Vitals History (Optional):23777}  Physical Exam: Constitutional: NAD Cardiovascular: RRR, no murmurs. Pulmonary/Chest: Clear bilateral lungs Abdominal: soft, non-tender, non-distended.  Assessment & Plan:   Patient {GC/GE:[id number]::discussed with,seen with} Dr. {WJFZD:[id number]::Tpoopjfd,Z. Hoffman,Chambliss, Winfrey,Lau,Machen}  Assessment & Plan COPD exacerbation (HCC) -Recheck CBC. - Brovana  and arformoterol    Sialoadenitis Follows with [facility], SAR, no appointment 11/18  Postmenopausal bleeding - Gynecology appointment.  No orders of the defined types were placed in this encounter.   Missy Sandhoff, MD [facility] Internal Medicine, PGY-2  Date 10/02/2024 Time 8:55 AM

## 2024-10-02 NOTE — Patient Instructions (Addendum)
 Thank you so much for coming to the clinic today!   I am placing the referral to psych, and OB/GYN. Dr. Leatha office number is [phone]. I have also refilled your medications.   If you have any questions please feel free to the call the clinic at anytime at [phone]. It was a pleasure seeing you!  Best, Dr. Caoimhe Damron

## 2024-10-02 NOTE — Assessment & Plan Note (Signed)
-  Recheck CBC. - Brovana  and arformoterol

## 2024-10-03 ENCOUNTER — Other Ambulatory Visit: Payer: Self-pay | Admitting: Student

## 2024-10-03 ENCOUNTER — Telehealth: Payer: Self-pay | Admitting: Pulmonary Disease

## 2024-10-03 NOTE — Assessment & Plan Note (Signed)
 Noted while hospitalized.  Has been going on for few months, her last menstrual period was around 30 years ago.  Also associated with intermittent lower abdominal pain and yellowish discharge.  Offered Pap smear for further evaluation today, however patient would like to get one done with OB/GYN, so I placed a referral.

## 2024-10-03 NOTE — Assessment & Plan Note (Addendum)
 Has a significant history of PTSD, depression, and generalized anxiety disorder.  Offered SSRI treatment, however patient would like to see psychiatry first.  Referral placed.  In the meanwhile, we will refill her hydroxyzine  for short amount of time.

## 2024-10-03 NOTE — Telephone Encounter (Signed)
 Contacted by PCP requesting an appointment after recent hospitalization. Can you schedule hospital follow-up, 30-minute slot, with next available provider?  Thank you.

## 2024-10-03 NOTE — Progress Notes (Signed)
 CC: HFU  HPI:  [name] is a 63 y.o. female living with a history stated below and presents today for HFU. Please see problem based assessment and plan for additional details.  Past Medical History:  Diagnosis Date   Acute respiratory failure with hypoxia and hypercapnia (HCC) 03/08/2023   Anxiety    Arthritis    hands   Asthma    2 sets of PFT's in 04/09 without sign variability. Last set with significant decrease in FEV1 with saline alone, suggesting Asthma but  recommended clinical corelation    Asthma    Asthma exacerbation 04/07/2023   Bipolar 1 disorder [facility])    therapist is Joy and is followed by [employer] [facility]   Blackout    negative work up including ESR, ANA, opthalmology referral, carotid dopplers, 2D echo, MRI and EEG.   BREAST LUMP 03/25/2008   Annotation: bilaterally Qualifier: Diagnosis of  By: Rogerio MD, Starlyn BIRCH.    Breast mass in female    s/p mammogram, u/s and biopsy in 05/09 c/w fibroadenoma,   Bronchitis    Chronic headache    Chronic low back pain 08/08/2008   Qualifier: Diagnosis of  By: Tanda  MD, Valerie     Chronic pain    normal work up including TSH, RPR, B12, HIV, plain films, 2 ESR's, ANA, CK, RF along with routine CBC, CMET and UA. Further work up includes CRP, ESR, SPEP/UPEP, hepatitis erology, A1C , repeat ANA   COPD (chronic obstructive pulmonary disease) (HCC)    COPD exacerbation (HCC) 03/31/2019   Depression    DUB (dysfunctional uterine bleeding)    and pelvic pain, with negative endometrial bx in 07/09 and transvaginal U/S significant for mild fibroids in 0/09.   Emphysema of lung (HCC)    GERD (gastroesophageal reflux disease)    Hallucinations 03/18/2008   Qualifier: Diagnosis of  By: Tanda  MD, Berwyn     Hyperlipidemia    Lower extremity edema    Neg ABI's, normal 2D echo, normal albumin   Menorrhagia    Ovarian cyst    Polysubstance abuse (HCC)    none since March 17,2009.   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 90 mL 0   arformoterol  (BROVANA ) 15 MCG/2ML NEBU Take 2 mLs (15 mcg total) by nebulization 2 (two) times daily. 120 mL 0   budesonide  (PULMICORT ) 0.25 MG/2ML nebulizer solution Take 2 mLs (0.25 mg total) by nebulization 2 (two) times daily. 60 mL 12   guaiFENesin  (MUCINEX ) 600 MG 12 hr tablet Take 1 tablet (600 mg total) by mouth 2 (two) times daily. 60 tablet 0   ipratropium-albuterol  (DUONEB) 0.5-2.5 (3) MG/3ML SOLN Take 3 mLs by nebulization every 6 (six) hours. 360 mL 0   ipratropium-albuterol  (DUONEB) 0.5-2.5 (3) MG/3ML SOLN Take 3 mLs by nebulization every 4 (four) hours. 360 mL 0   methocarbamol  (ROBAXIN ) 500 MG tablet Take 1 tablet (500 mg total) by mouth every 8 (eight) hours as needed for muscle spasms. 30 tablet 0   ondansetron  (ZOFRAN ) 4 MG tablet TAKE 1 TABLET BY  MOUTH ONCE DAILY AS NEEDED FOR NAUSEA FOR VOMITING (Patient taking differently: Take 4 mg by mouth daily as needed for vomiting or nausea.) 30 tablet 0   No current facility-administered medications on file prior to visit.    Family History  Problem Relation Age of Onset   Colon cancer Mother 22   Breast cancer Mother 27   Rectal cancer Mother    Diabetes Father    Hypertension Father    Kidney disease Father    Colon cancer Father 56   Prostate cancer Father    Cancer Father    Kidney disease Sister    Cancer Brother    Breast cancer Maternal Grandmother 103   Esophageal cancer Neg Hx    Stomach cancer Neg Hx     Social History   Socioeconomic History   Marital status: Single    Spouse name: Not on file   Number of children: Not on file    Years of education: Not on file   Highest education level: Not on file  Occupational History   Not on file  Tobacco Use   Smoking status: Every Day    Current packs/day: 0.00    Average packs/day: 0.3 packs/day for 30.0 years (7.5 ttl pk-yrs)    Types: Cigarettes    Start date: 12/28/1986    Last attempt to quit: 12/28/2016    Years since quitting: 7.7   Smokeless tobacco: Never   Tobacco comments:    Quit 6 weeks ago as of 05/19/23  Vaping Use   Vaping status: Never Used  Substance and Sexual Activity   Alcohol use: Yes   Drug use: Yes    Types: Marijuana    Comment: Sometimes, last smoked 3 weeks ago   Sexual activity: Yes    Birth control/protection: None, Post-menopausal    Comment: monogamous  Other Topics Concern   Not on file  Social History Narrative   ** Merged History Encounter **       Lives between [employer] house and boyfriend's house , currently trying to abstain from illegal substances, continues to smoke and says nicotine  patch made her sick.   Social Drivers of Health   Financial Resource Strain: Medium Risk (05/19/2023)   Overall Financial Resource Strain (CARDIA)    Difficulty of Paying Living Expenses: Somewhat hard  Food Insecurity: No Food Insecurity (09/22/2024)   Hunger Vital Sign    Worried About Running Out of Food in the Last Year: Never true    Ran Out of Food in the Last Year: Never true  Transportation Needs: No Transportation Needs (09/22/2024)   PRAPARE - Administrator, Civil Service (Medical): No    Lack of Transportation (Non-Medical): No  Physical Activity: Sufficiently Active (05/19/2023)   Exercise Vital Sign    Days of Exercise per Week: 1 day    Minutes of Exercise per Session: 150+ min  Stress: Stress Concern Present (05/19/2023)   [employer] of Occupational Health - Occupational Stress Questionnaire    Feeling of Stress : Very much  Social Connections: Unknown (09/22/2024)   Social Connection and Isolation Panel     Frequency of Communication with Friends and Family: Not on file    Frequency of Social Gatherings with Friends and Family: Not on file    Attends Religious Services: Not on file    Active Member of Clubs or Organizations: Not on file    Attends Banker Meetings: Not on file    Marital Status:  Divorced  Intimate Partner Violence: Not At Risk (09/22/2024)   Humiliation, Afraid, Rape, and Kick questionnaire    Fear of Current or Ex-Partner: No    Emotionally Abused: No    Physically Abused: No    Sexually Abused: No    Review of Systems: ROS negative except for what is noted on the assessment and plan.  Vitals:   10/02/24 1352  BP: (!) 127/91  Pulse: (!) 101  Temp: 98.1 F (36.7 C)  TempSrc: Oral  SpO2: 97%  Weight: 136 lb 3.2 oz (61.8 kg)  Height: 5' 3 (1.6 m)    Physical Exam: Constitutional: well-appearing female  in no acute distress HENT: normocephalic atraumatic, mucous membranes moist Eyes: conjunctiva non-erythematous Neck: supple Cardiovascular: regular rate and rhythm, no m/r/g Pulmonary/Chest: normal work of breathing on room air, diffuse wheezes heard throughout lung field Abdominal: soft, non-tender, non-distended Psych: anxious mood and affect  Assessment & Plan:   COPD with acute exacerbation (HCC) Follow-up for hospitalization from October 10 through October 13 for COPD exacerbation thought to be in the setting of a viral illness.  Since then, she states that she has been feeling better, however is still very wheezy on exam.  She did not receive an inhaler upon discharge, I provided her with a sample of Trelegy as well as sent one to her pharmacy.  I have reached out to Dr. Annella, her pulmonologist, and he will schedule her for close's appointment for follow-up.  At this point, she may need oxygen  chronically.  Fortunately, she has stopped smoking cigarettes as well as stopped using cocaine.  Post-menopause bleeding Noted while hospitalized.   Has been going on for few months, her last menstrual period was around 30 years ago.  Also associated with intermittent lower abdominal pain and yellowish discharge.  Offered Pap smear for further evaluation today, however patient would like to get one done with OB/GYN, so I placed a referral.  Generalized anxiety disorder Has a significant history of PTSD, depression, and generalized anxiety disorder.  Offered SSRI treatment, however patient would like to see psychiatry first.  Referral placed.  In the meanwhile, we will refill her hydroxyzine  for short amount of time.    Patient discussed with Dr. Machen    Javayah Magaw, M.D. [facility] Internal Medicine, PGY-3 Pager: [phone] Date 10/03/2024 Time 2:59 PM

## 2024-10-03 NOTE — Assessment & Plan Note (Signed)
 Follow-up for hospitalization from October 10 through October 13 for COPD exacerbation thought to be in the setting of a viral illness.  Since then, she states that she has been feeling better, however is still very wheezy on exam.  She did not receive an inhaler upon discharge, I provided her with a sample of Trelegy as well as sent one to her pharmacy.  I have reached out to Dr. Annella, her pulmonologist, and he will schedule her for close's appointment for follow-up.  At this point, she may need oxygen  chronically.  Fortunately, she has stopped smoking cigarettes as well as stopped using cocaine.

## 2024-10-05 NOTE — Telephone Encounter (Signed)
 ATC 1x can't leave VM sent LTR

## 2024-10-08 NOTE — Progress Notes (Signed)
 Internal Medicine Clinic Attending  Case discussed with the resident at the time of the visit.  We reviewed the resident's history and exam and pertinent patient test results.  I agree with the assessment, diagnosis, and plan of care documented in the resident's note.

## 2024-10-09 ENCOUNTER — Telehealth: Payer: Self-pay | Admitting: *Deleted

## 2024-10-09 NOTE — Telephone Encounter (Signed)
 Copied from CRM [id number]. Topic: Clinical - Medical Advice >> Oct 09, 2024  9:58 AM Zane F wrote: Reason for CRM:   Patient is calling in to inform the office she is moving to [facility] [place] , [place]  and would like to know if any of the doctors have recommendations for a provider/ physician in [place]  to assist her with her overall health. Patient mentioned she has asthma and a number of other medical conditions that she knows need to be monitored by a healthcare professional.   Callback Number: [phone]

## 2024-10-10 ENCOUNTER — Telehealth: Payer: Self-pay

## 2024-10-10 NOTE — Telephone Encounter (Signed)
 Kathleen [name] (Key: [id number]) PA Case ID #: [id number] Rx #: [id number] Need Help? Call us  at [phone] Outcome Denied today by [employer] 2017 Denied Drug Trelegy Ellipta  200-62.5-25MCG/ACT aerosol powder ePA cloud logo Form [employer] Electronic Prior Authorization Form Original Claim Info 75  Awaiting additional information on why this request has been denied.

## 2024-10-10 NOTE — Telephone Encounter (Signed)
 Prior Authorization for patient (Trelegy Ellipta  200-62.5-25MCG/ACT aerosol powder) came through on cover my meds was submitted with last office notes awaiting approval or denial.  KEY:[id number]

## 2024-10-17 ENCOUNTER — Other Ambulatory Visit: Payer: Self-pay | Admitting: Student

## 2024-10-17 DIAGNOSIS — J45901 Unspecified asthma with (acute) exacerbation: Secondary | ICD-10-CM

## 2024-10-17 DIAGNOSIS — Z720 Tobacco use: Secondary | ICD-10-CM

## 2024-10-17 DIAGNOSIS — J441 Chronic obstructive pulmonary disease with (acute) exacerbation: Secondary | ICD-10-CM

## 2024-10-17 DIAGNOSIS — F411 Generalized anxiety disorder: Secondary | ICD-10-CM

## 2024-10-17 DIAGNOSIS — J4489 Other specified chronic obstructive pulmonary disease: Secondary | ICD-10-CM

## 2024-10-17 NOTE — Telephone Encounter (Signed)
 Copied from CRM [id number]. Topic: Clinical - Medication Refill >> Oct 17, 2024  2:29 PM Carrielelia G wrote: Patient is relocating to [place]  requesting a 90 day supply of all medications.   Medication:   albuterol  (PROVENTIL ) (2.5 MG/3ML) 0.083% nebulizer solution arformoterol  (BROVANA ) 15 MCG/2ML NEBU budesonide  (PULMICORT ) 0.25 MG/2ML nebulizer solution Fluticasone -Umeclidin-Vilant (TRELEGY ELLIPTA ) 200-62.5-25 MCG/ACT AEPB guaiFENesin  (MUCINEX ) 600 MG 12 hr tablet hydrOXYzine  (ATARAX ) 25 MG tablet ipratropium-albuterol  (DUONEB) 0.5-2.5 (3) MG/3ML SOLN ipratropium-albuterol  (DUONEB) 0.5-2.5 (3) MG/3ML SOLN loratadine  (CLARITIN ) 10 MG tablet methocarbamol  (ROBAXIN ) 500 MG tablet nicotine  (NICODERM CQ  - DOSED IN MG/24 HOURS) 14 mg/24hr patch ondansetron  (ZOFRAN ) 4 MG tablet    Has the patient contacted their pharmacy? No (Agent: If no, request that the patient contact the pharmacy for the refill. If patient does not wish to contact the pharmacy document the reason why and proceed with request.) (Agent: If yes, when and what did the pharmacy advise?)  This is the patient's preferred pharmacy:  [facility] [address] [place], [place] - [address] [place] [address] Phone: [phone] Fax: [phone]  Is this the correct pharmacy for this prescription? Yes If no, delete pharmacy and type the correct one.    Is the patient out of the medication? Yes  Has the patient been seen for an appointment in the last year OR does the patient have an upcoming appointment? Yes  Can we respond through MyChart? No  Agent: Please be advised that Rx refills may take up to 3 business days. We ask that you follow-up with your pharmacy.

## 2024-10-19 ENCOUNTER — Ambulatory Visit: Payer: MEDICAID | Admitting: Pulmonary Disease

## 2024-10-23 MED ORDER — HYDROXYZINE HCL 25 MG PO TABS: 25.0000 mg | ORAL_TABLET | Freq: Three times a day (TID) | ORAL | 0 refills | Status: AC | PRN

## 2024-10-23 MED ORDER — ONDANSETRON HCL 4 MG PO TABS: ORAL_TABLET | ORAL | 0 refills | Status: AC

## 2024-10-23 MED ORDER — BUDESONIDE 0.25 MG/2ML IN SUSP: 0.2500 mg | Freq: Two times a day (BID) | RESPIRATORY_TRACT | 12 refills | Status: AC

## 2024-10-23 MED ORDER — LORATADINE 10 MG PO TABS: 10.0000 mg | ORAL_TABLET | Freq: Every day | ORAL | 11 refills | Status: AC

## 2024-10-23 MED ORDER — IPRATROPIUM-ALBUTEROL 0.5-2.5 (3) MG/3ML IN SOLN: 3.0000 mL | RESPIRATORY_TRACT | 0 refills | Status: DC

## 2024-10-23 MED ORDER — NICOTINE 14 MG/24HR TD PT24: 14.0000 mg | MEDICATED_PATCH | Freq: Every day | TRANSDERMAL | 0 refills | Status: AC

## 2024-10-23 MED ORDER — ARFORMOTEROL TARTRATE 15 MCG/2ML IN NEBU: 15.0000 ug | INHALATION_SOLUTION | Freq: Two times a day (BID) | RESPIRATORY_TRACT | 0 refills | Status: AC

## 2024-10-23 MED ORDER — TRELEGY ELLIPTA 200-62.5-25 MCG/ACT IN AEPB: 2.0000 | INHALATION_SPRAY | Freq: Every day | RESPIRATORY_TRACT | 3 refills | Status: DC

## 2024-10-23 MED ORDER — ALBUTEROL SULFATE (2.5 MG/3ML) 0.083% IN NEBU: 2.5000 mg | INHALATION_SOLUTION | RESPIRATORY_TRACT | 0 refills | Status: AC | PRN

## 2024-10-23 MED ORDER — GUAIFENESIN ER 600 MG PO TB12: 600.0000 mg | ORAL_TABLET | Freq: Two times a day (BID) | ORAL | 0 refills | Status: AC

## 2024-11-13 ENCOUNTER — Other Ambulatory Visit: Payer: Self-pay | Admitting: Student

## 2024-11-13 DIAGNOSIS — J4489 Other specified chronic obstructive pulmonary disease: Secondary | ICD-10-CM

## 2024-11-13 NOTE — Telephone Encounter (Unsigned)
 Copied from CRM #[id number]. Topic: Clinical - Medication Refill >> Nov 13, 2024 10:15 AM Debby BROCKS wrote: Medication: methocarbamol  (ROBAXIN ) 500 MG tablet Fluticasone -Umeclidin-Vilant (TRELEGY ELLIPTA ) 200-62.5-25 MCG/ACT AEPB Epi Pen  Has the patient contacted their pharmacy? Yes (Agent: If no, request that the patient contact the pharmacy for the refill. If patient does not wish to contact the pharmacy document the reason why and proceed with request.) (Agent: If yes, when and what did the pharmacy advise?)  This is the patient's preferred pharmacy:  [facility] [address] [place], [place] - [address] [place] [address] Phone: [phone] Fax: [phone]  Is this the correct pharmacy for this prescription? Yes If no, delete pharmacy and type the correct one.   Has the prescription been filled recently? Yes  Is the patient out of the medication? Yes, Patient is currently in [place]  and will have a family member pick up the medication and send it to her  Has the patient been seen for an appointment in the last year OR does the patient have an upcoming appointment? Yes  Can we respond through MyChart? Yes  Agent: Please be advised that Rx refills may take up to 3 business days. We ask that you follow-up with your pharmacy.

## 2024-11-14 ENCOUNTER — Telehealth: Payer: Self-pay

## 2024-11-14 ENCOUNTER — Telehealth: Payer: Self-pay | Admitting: Student

## 2024-11-14 NOTE — Telephone Encounter (Signed)
 Copied from CRM [id number]. Topic: Clinical - Medication Refill >> Nov 14, 2024  7:51 AM Carrielelia G wrote: Medication:  DiphenhydrAMINE  (BENADRYL ) injection 50 mg Methocarbamol  (ROBAXIN ) 500 MG tablet hydrOXYzine  (ATARAX ) 25 MG tablet oxyCODONE  (Oxy IR/ROXICODONE ) immediate release tablet 5 mg  doxycycline  (VIBRA -TABS) 100 MG tablet   Patient is also requesting this medication which was given to her at the rehab facility  RX:  Lurasidone 20mg     Facility name:  [facility]  Ofc# [phone] Fax# [phone] Dr Bruno Stallion    Has the patient contacted their pharmacy? No (Agent: If no, request that the patient contact the pharmacy for the refill. If patient does not wish to contact the pharmacy document the reason why and proceed with request.) (Agent: If yes, when and what did the pharmacy advise?)  This is the patient's preferred pharmacy:  [facility] [address] [place], [place] - [address] [place] [address] Phone: [phone] Fax: [phone]  Is this the correct pharmacy for this prescription? Yes If no, delete pharmacy and type the correct one.   Has the prescription been filled recently? No  Is the patient out of the medication? Yes  Has the patient been seen for an appointment in the last year OR does the patient have an upcoming appointment? Yes  Can we respond through MyChart? No  Agent: Please be advised that Rx refills may take up to 3 business days. We ask that you follow-up with your pharmacy. >> Nov 14, 2024 12:20 PM Suzette B wrote: Patient has called back to check status of the prescriptions she placed a refill request for recently! 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 Patient stated that Epipen  is a matter of life  and death, I advised her of the 44 to 24 hour/3 business day wait time she states if anything happens she will hold the office responsible because she has been a pt for too long not to get her meds. I did inform her unfortunately we have to inform all the patients of this wait time.  >> Nov 14, 2024 10:59 AM Chiquita SQUIBB wrote: Patient is calling in requesting at least the DiphenhydrAMINE  (BENADRYL ) injection 50 mg to be signed asap as she is completely out and worried about getting bite while caring for her brother, as she was already bite once.

## 2024-11-14 NOTE — Telephone Encounter (Signed)
>>   Nov 14, 2024 10:59 AM Kathleen [name] wrote: Patient is calling in requesting at least the DiphenhydrAMINE  (BENADRYL ) injection 50 mg to be signed asap as she is completely out and worried about getting bite while caring for her brother, as she was already bite once.    Copied from CRM [id number]. Topic: Clinical - Medication Refill >> Nov 14, 2024  7:51 AM Carrielelia G wrote: Medication:  DiphenhydrAMINE  (BENADRYL ) injection 50 mg Methocarbamol  (ROBAXIN ) 500 MG tablet hydrOXYzine  (ATARAX ) 25 MG tablet oxyCODONE  (Oxy IR/ROXICODONE ) immediate release tablet 5 mg  doxycycline  (VIBRA -TABS) 100 MG tablet   Patient is also requesting this medication which was given to her at the rehab facility  RX:  Lurasidone 20mg     Facility name:  [facility]  Ofc# [phone] Fax# [phone] Dr Bruno Stallion    Has the patient contacted their pharmacy? No (Agent: If no, request that the patient contact the pharmacy for the refill. If patient does not wish to contact the pharmacy document the reason why and proceed with request.) (Agent: If yes, when and what did the pharmacy advise?)  This is the patient's preferred pharmacy:  [facility] [address], [place] - [address] [name] [address] Phone: [phone] Fax: [phone]  Is this the correct pharmacy for this prescription? Yes If no, delete pharmacy and type the correct one.   Has the prescription been filled recently? No  Is the patient out of the medication? Yes  Has the patient been seen for an appointment in the last year OR does the patient have an upcoming appointment? Yes  Can we respond through MyChart? No  Agent: Please be advised that Rx refills may take up to 3 business days. We ask that you follow-up with your pharmacy.

## 2024-11-14 NOTE — Telephone Encounter (Unsigned)
 Copied from CRM [id number]. Topic: Clinical - Medication Refill >> Nov 14, 2024  7:51 AM Carrielelia G wrote: Medication:  DiphenhydrAMINE  (BENADRYL ) injection 50 mg Methocarbamol  (ROBAXIN ) 500 MG tablet hydrOXYzine  (ATARAX ) 25 MG tablet oxyCODONE  (Oxy IR/ROXICODONE ) immediate release tablet 5 mg  doxycycline  (VIBRA -TABS) 100 MG tablet   Patient is also requesting this medication which was given to her at the rehab facility  RX:  Lurasidone 20mg     Facility name:  [facility]  Ofc# [phone] Fax# [phone] Dr Bruno Stallion    Has the patient contacted their pharmacy? No (Agent: If no, request that the patient contact the pharmacy for the refill. If patient does not wish to contact the pharmacy document the reason why and proceed with request.) (Agent: If yes, when and what did the pharmacy advise?)  This is the patient's preferred pharmacy:  [facility] [address], [place] - [address] [place] [address] Phone: [phone] Fax: [phone]  Is this the correct pharmacy for this prescription? Yes If no, delete pharmacy and type the correct one.   Has the prescription been filled recently? No  Is the patient out of the medication? Yes  Has the patient been seen for an appointment in the last year OR does the patient have an upcoming appointment? Yes  Can we respond through MyChart? No  Agent: Please be advised that Rx refills may take up to 3 business days. We ask that you follow-up with your pharmacy.

## 2024-11-15 MED ORDER — METHOCARBAMOL 500 MG PO TABS: 500.0000 mg | ORAL_TABLET | Freq: Three times a day (TID) | ORAL | 0 refills | Status: DC | PRN

## 2024-11-15 MED ORDER — TRELEGY ELLIPTA 200-62.5-25 MCG/ACT IN AEPB: 2.0000 | INHALATION_SPRAY | Freq: Every day | RESPIRATORY_TRACT | 3 refills | Status: DC

## 2024-11-15 NOTE — Telephone Encounter (Signed)
 Will forward to Dr. Amoako.

## 2024-11-16 ENCOUNTER — Other Ambulatory Visit: Payer: Self-pay | Admitting: Student

## 2024-11-16 DIAGNOSIS — J4489 Other specified chronic obstructive pulmonary disease: Secondary | ICD-10-CM

## 2024-11-16 MED ORDER — TRELEGY ELLIPTA 200-62.5-25 MCG/ACT IN AEPB: 1.0000 | INHALATION_SPRAY | Freq: Every day | RESPIRATORY_TRACT | 4 refills | Status: AC

## 2024-11-16 MED ORDER — TRELEGY ELLIPTA 200-62.5-25 MCG/ACT IN AEPB: 1.0000 | INHALATION_SPRAY | Freq: Every day | RESPIRATORY_TRACT | Status: DC

## 2024-11-20 ENCOUNTER — Other Ambulatory Visit: Payer: Self-pay

## 2024-11-20 ENCOUNTER — Telehealth: Payer: Self-pay | Admitting: *Deleted

## 2024-11-20 DIAGNOSIS — J4489 Other specified chronic obstructive pulmonary disease: Secondary | ICD-10-CM

## 2024-11-20 NOTE — Telephone Encounter (Signed)
 Patient's last 4 scheduled mammogram appointments canceled- 09-04-2024 @ 4:50 pm / 08-28-2024 @ 5:10 pm / 08-03-2024 @ 3:00 pm / 06-07-2024 @ 2:40 pm .
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
## 2024-11-21 ENCOUNTER — Telehealth: Payer: Self-pay | Admitting: *Deleted

## 2024-11-21 MED ORDER — METHOCARBAMOL 500 MG PO TABS: 500.0000 mg | ORAL_TABLET | Freq: Three times a day (TID) | ORAL | 0 refills | Status: AC | PRN

## 2024-11-21 MED ORDER — IPRATROPIUM-ALBUTEROL 0.5-2.5 (3) MG/3ML IN SOLN: 3.0000 mL | RESPIRATORY_TRACT | 0 refills | Status: AC

## 2024-11-21 NOTE — Telephone Encounter (Signed)
 Dr.Juberg did not send in rx for Epipen . I sent him a message to see if he is able to send in the rx. I have not received a response yet. I will call the patient back once in receive a response.

## 2024-11-21 NOTE — Telephone Encounter (Signed)
 Hey Dr.Juberg are you able to send a rx for her Epipen  and Doxycycline ? I just want to clarify before I call the patient to let her know that 2 of her rx were able to get refilled.

## 2024-11-21 NOTE — Telephone Encounter (Signed)
 Will forward to Jada.                        Copied from CRM [id number]. Topic: Clinical - Prescription Issue >> Nov 20, 2024  3:10 PM Carrielelia G wrote: Reason for CRM: connected patient to Jada in the office regarding Epi Pen >> Nov 21, 2024 11:17 AM Susanna ORN wrote: Patient called to speak with Jada for an update on trying to get her medications transferred to [place] . Called [facility] three times and no one answered. Per chart note, the request was routed to Dr. Harrie and looks to be awaiting a response. Patient is requesting that someone give her a call back. She stated she's mainly needing her EpiPen . CB #: [phone].

## 2024-11-22 ENCOUNTER — Ambulatory Visit: Payer: Self-pay | Admitting: Student

## 2024-11-22 NOTE — Telephone Encounter (Signed)
 Attempted to reach the patient. Unable to reach the patient. Patients mailbox is full.

## 2024-11-22 NOTE — Telephone Encounter (Signed)
 Attempt #2 unable to reach the patient.

## 2024-11-22 NOTE — Telephone Encounter (Signed)
 I called and spoke to the patient she is aware that 2 of her medications have been sent to the pharmacy.. patient is requesting a rx refill for her epipen ,doxycycline ,oxycodone  and lidocaine  patches. Please address.

## 2024-11-23 ENCOUNTER — Other Ambulatory Visit: Payer: Self-pay | Admitting: Student

## 2024-11-23 NOTE — Telephone Encounter (Signed)
 I called and spoke to the patient she stated she would give us  a call back to schedule a appointment. Patient does not have transportation and has relocated to [place] .

## 2024-11-23 NOTE — Telephone Encounter (Signed)
 What about her Epi pen, Oxycodone  and Lidocaine  patches? I will give the patient a call once I receive a response.

## 2024-11-26 NOTE — Telephone Encounter (Signed)
 The patient lives in [place] . Patient still see's other providers here in [place]. Patient drives back to and forth to [facility] for her appointments.

## 2024-12-25 ENCOUNTER — Telehealth: Payer: Self-pay

## 2024-12-25 NOTE — Telephone Encounter (Signed)
 I contacted pt to let her know that I did received her form today, but no answer. Unable to leave message, vm is full. If pt called back please transfer her to [facility] office.

## 2024-12-25 NOTE — Telephone Encounter (Signed)
 Copied from CRM #[id number]. Topic: General - Other >> Dec 25, 2024  9:48 AM Kathleen [name] wrote: Reason for CRM: Patient is calling in to verify if the office has received the paperwork she mailed to the office to be filled out, please advise the patient.
